# Patient Record
Sex: Female | Born: 1935 | Race: White | Hispanic: No | Marital: Single | Smoking: Never smoker
Health system: Southern US, Community
[De-identification: ages and names within clinical notes are randomized; demographics above are authoritative.]

## PROBLEM LIST (undated history)

## (undated) DIAGNOSIS — I2510 Atherosclerotic heart disease of native coronary artery without angina pectoris: Secondary | ICD-10-CM

## (undated) DIAGNOSIS — K219 Gastro-esophageal reflux disease without esophagitis: Secondary | ICD-10-CM

## (undated) DIAGNOSIS — J449 Chronic obstructive pulmonary disease, unspecified: Secondary | ICD-10-CM

## (undated) DIAGNOSIS — K589 Irritable bowel syndrome without diarrhea: Secondary | ICD-10-CM

## (undated) DIAGNOSIS — F0390 Unspecified dementia without behavioral disturbance: Secondary | ICD-10-CM

## (undated) DIAGNOSIS — R12 Heartburn: Secondary | ICD-10-CM

## (undated) DIAGNOSIS — F411 Generalized anxiety disorder: Secondary | ICD-10-CM

## (undated) DIAGNOSIS — E785 Hyperlipidemia, unspecified: Secondary | ICD-10-CM

## (undated) DIAGNOSIS — E079 Disorder of thyroid, unspecified: Secondary | ICD-10-CM

## (undated) DIAGNOSIS — C801 Malignant (primary) neoplasm, unspecified: Secondary | ICD-10-CM

## (undated) DIAGNOSIS — H9110 Presbycusis, unspecified ear: Secondary | ICD-10-CM

## (undated) DIAGNOSIS — G4733 Obstructive sleep apnea (adult) (pediatric): Secondary | ICD-10-CM

## (undated) DIAGNOSIS — I639 Cerebral infarction, unspecified: Secondary | ICD-10-CM

## (undated) DIAGNOSIS — I219 Acute myocardial infarction, unspecified: Secondary | ICD-10-CM

## (undated) HISTORY — DX: Disorder of thyroid, unspecified: E07.9

## (undated) HISTORY — DX: Heartburn: R12

## (undated) HISTORY — DX: Cerebral infarction, unspecified: I63.9

## (undated) HISTORY — DX: Acute myocardial infarction, unspecified: I21.9

## (undated) HISTORY — DX: Atherosclerotic heart disease of native coronary artery without angina pectoris: I25.10

## (undated) HISTORY — PX: BREAST SURGERY: SHX581

## (undated) HISTORY — PX: ABDOMINAL HYSTERECTOMY: SHX81

## (undated) HISTORY — DX: Chronic obstructive pulmonary disease, unspecified: J44.9

## (undated) HISTORY — DX: Irritable bowel syndrome, unspecified: K58.9

## (undated) HISTORY — DX: Generalized anxiety disorder: F41.1

## (undated) HISTORY — DX: Gastro-esophageal reflux disease without esophagitis: K21.9

## (undated) HISTORY — DX: Presbycusis, unspecified ear: H91.10

## (undated) HISTORY — DX: Hyperlipidemia, unspecified: E78.5

## (undated) HISTORY — DX: Malignant (primary) neoplasm, unspecified: C80.1

## (undated) HISTORY — DX: Obstructive sleep apnea (adult) (pediatric): G47.33

---

## 2004-07-17 ENCOUNTER — Ambulatory Visit: Payer: Self-pay | Admitting: Otolaryngology

## 2004-11-14 ENCOUNTER — Ambulatory Visit: Payer: Self-pay | Admitting: Surgery

## 2004-11-29 ENCOUNTER — Ambulatory Visit: Payer: Self-pay | Admitting: Gastroenterology

## 2004-12-25 ENCOUNTER — Inpatient Hospital Stay: Payer: Self-pay | Admitting: Surgery

## 2004-12-27 ENCOUNTER — Other Ambulatory Visit: Payer: Self-pay

## 2005-01-22 ENCOUNTER — Ambulatory Visit: Payer: Self-pay | Admitting: Gastroenterology

## 2005-02-13 ENCOUNTER — Ambulatory Visit: Payer: Self-pay | Admitting: Surgery

## 2005-05-05 ENCOUNTER — Ambulatory Visit: Payer: Self-pay | Admitting: Gastroenterology

## 2005-05-21 ENCOUNTER — Ambulatory Visit: Payer: Self-pay | Admitting: Surgery

## 2005-06-23 ENCOUNTER — Ambulatory Visit: Payer: Self-pay | Admitting: General Surgery

## 2005-09-23 ENCOUNTER — Ambulatory Visit: Payer: Self-pay | Admitting: Unknown Physician Specialty

## 2005-11-26 ENCOUNTER — Ambulatory Visit: Payer: Self-pay | Admitting: Surgery

## 2005-12-10 ENCOUNTER — Ambulatory Visit: Payer: Self-pay | Admitting: Oncology

## 2006-05-15 ENCOUNTER — Ambulatory Visit: Payer: Self-pay | Admitting: Oncology

## 2006-06-30 ENCOUNTER — Ambulatory Visit: Payer: Self-pay | Admitting: General Surgery

## 2007-06-03 ENCOUNTER — Ambulatory Visit: Payer: Self-pay | Admitting: Unknown Physician Specialty

## 2007-07-01 ENCOUNTER — Ambulatory Visit: Payer: Self-pay | Admitting: Unknown Physician Specialty

## 2007-07-01 ENCOUNTER — Ambulatory Visit: Payer: Self-pay | Admitting: General Surgery

## 2007-07-06 ENCOUNTER — Ambulatory Visit: Payer: Self-pay | Admitting: General Surgery

## 2007-07-30 ENCOUNTER — Ambulatory Visit: Payer: Self-pay | Admitting: Unknown Physician Specialty

## 2007-08-17 ENCOUNTER — Ambulatory Visit: Payer: Self-pay | Admitting: Gastroenterology

## 2008-07-03 ENCOUNTER — Ambulatory Visit: Payer: Self-pay | Admitting: General Surgery

## 2008-07-28 LAB — HM DIABETES EYE EXAM

## 2009-02-21 ENCOUNTER — Ambulatory Visit: Payer: Self-pay | Admitting: Internal Medicine

## 2009-04-03 ENCOUNTER — Ambulatory Visit: Payer: Self-pay | Admitting: Specialist

## 2009-04-23 ENCOUNTER — Inpatient Hospital Stay: Payer: Self-pay | Admitting: Internal Medicine

## 2009-05-31 ENCOUNTER — Encounter: Payer: Self-pay | Admitting: Cardiovascular Disease

## 2009-06-27 ENCOUNTER — Encounter: Payer: Self-pay | Admitting: Cardiovascular Disease

## 2009-07-28 ENCOUNTER — Encounter: Payer: Self-pay | Admitting: Cardiovascular Disease

## 2009-10-16 ENCOUNTER — Ambulatory Visit: Payer: Self-pay | Admitting: General Surgery

## 2009-12-13 DIAGNOSIS — I2510 Atherosclerotic heart disease of native coronary artery without angina pectoris: Secondary | ICD-10-CM | POA: Insufficient documentation

## 2009-12-13 DIAGNOSIS — E785 Hyperlipidemia, unspecified: Secondary | ICD-10-CM | POA: Insufficient documentation

## 2009-12-13 DIAGNOSIS — E119 Type 2 diabetes mellitus without complications: Secondary | ICD-10-CM

## 2010-04-18 ENCOUNTER — Inpatient Hospital Stay: Payer: Self-pay | Admitting: Internal Medicine

## 2010-05-09 ENCOUNTER — Inpatient Hospital Stay: Payer: Self-pay | Admitting: Internal Medicine

## 2010-06-05 ENCOUNTER — Emergency Department: Payer: Self-pay | Admitting: Emergency Medicine

## 2010-07-28 LAB — HM DIABETES FOOT EXAM

## 2010-10-23 ENCOUNTER — Ambulatory Visit: Payer: Self-pay | Admitting: General Surgery

## 2010-11-11 ENCOUNTER — Ambulatory Visit: Payer: Self-pay | Admitting: Internal Medicine

## 2011-04-02 ENCOUNTER — Encounter: Payer: Self-pay | Admitting: Internal Medicine

## 2011-04-03 ENCOUNTER — Ambulatory Visit (INDEPENDENT_AMBULATORY_CARE_PROVIDER_SITE_OTHER): Payer: Medicare Other | Admitting: Internal Medicine

## 2011-04-03 ENCOUNTER — Encounter: Payer: Self-pay | Admitting: Internal Medicine

## 2011-04-03 DIAGNOSIS — E1151 Type 2 diabetes mellitus with diabetic peripheral angiopathy without gangrene: Secondary | ICD-10-CM | POA: Insufficient documentation

## 2011-04-03 DIAGNOSIS — B3731 Acute candidiasis of vulva and vagina: Secondary | ICD-10-CM

## 2011-04-03 DIAGNOSIS — E119 Type 2 diabetes mellitus without complications: Secondary | ICD-10-CM

## 2011-04-03 DIAGNOSIS — B373 Candidiasis of vulva and vagina: Secondary | ICD-10-CM

## 2011-04-03 LAB — COMPREHENSIVE METABOLIC PANEL
Albumin: 3.7 g/dL (ref 3.5–5.2)
Alkaline Phosphatase: 118 U/L — ABNORMAL HIGH (ref 39–117)
BUN: 18 mg/dL (ref 6–23)
CO2: 27 mEq/L (ref 19–32)
Calcium: 8.7 mg/dL (ref 8.4–10.5)
Chloride: 100 mEq/L (ref 96–112)
Glucose, Bld: 415 mg/dL — ABNORMAL HIGH (ref 70–99)
Potassium: 4.1 mEq/L (ref 3.5–5.1)
Sodium: 134 mEq/L — ABNORMAL LOW (ref 135–145)
Total Protein: 7.3 g/dL (ref 6.0–8.3)

## 2011-04-03 LAB — HEMOGLOBIN A1C: Hgb A1c MFr Bld: 8.3 % — ABNORMAL HIGH (ref 4.6–6.5)

## 2011-04-03 MED ORDER — NYSTATIN 100000 UNIT/GM EX POWD: 1.0000 g | Freq: Two times a day (BID) | CUTANEOUS | Status: DC

## 2011-04-03 MED ORDER — FLUCONAZOLE 150 MG PO TABS: 150.0000 mg | ORAL_TABLET | Freq: Every day | ORAL | Status: AC

## 2011-04-03 NOTE — Patient Instructions (Signed)
Continue Insulin as you are doing. Return visit in 3 months

## 2011-04-03 NOTE — Progress Notes (Signed)
Subjective:    Patient ID: [name] [name], female    DOB: 1931/11/29, 77 y.o.   MRN: [id number]  HPI [name] [name] is a 77 year old female with a history of diabetes mellitus who presents for followup. She brings record of her blood sugars which have ranged from lows in the 50s to highs above 400. She continues to take Lantus insulin 28 units every morning. When her sugar is above 300 she takes 4 units of NovoLog. She reports that if she increases her NovoLog beyond 4 units that her sugar plummets. Her recent hemoglobin A1c has been between 8 and 9%. She is symptomatic with her low blood sugars and carry sugar with her. She is cautious about eating a regular basis. She denies any polyuria, polyphasia, change in sensation or open wounds.  [name] [name] is also concerned today about a rash in her genital area. This rash is been present for several weeks it is described as red and itchy. She has not tried any medication for this. She denies any fever, chills, or other complaints.  Outpatient Encounter Prescriptions as of 04/03/2011  Medication Sig Dispense Refill  . insulin aspart (NOVOLOG FLEXPEN) 100 UNIT/ML injection Sliding scale       . insulin glargine (LANTUS SOLOSTAR) 100 UNIT/ML injection Inject 28 Units into the skin daily.        Marland Kitchen levothyroxine (SYNTHROID, LEVOTHROID) 100 MCG tablet Take 100 mcg by mouth daily.           Review of Systems  Constitutional: Negative for fever, chills, appetite change, fatigue and unexpected weight change.  HENT: Negative for ear pain, congestion, sore throat, trouble swallowing, neck pain, voice change and sinus pressure.   Eyes: Negative for visual disturbance.  Respiratory: Negative for cough, shortness of breath, wheezing and stridor.   Cardiovascular: Negative for chest pain, palpitations and leg swelling.  Gastrointestinal: Negative for nausea, vomiting, abdominal pain, diarrhea, constipation, blood in stool, abdominal distention and anal bleeding.    Genitourinary: Negative for dysuria and flank pain.  Musculoskeletal: Negative for myalgias, arthralgias and gait problem.  Skin: Positive for rash. Negative for color change.  Neurological: Negative for dizziness and headaches.  Hematological: Negative for adenopathy. Does not bruise/bleed easily.  Psychiatric/Behavioral: Negative for suicidal ideas, sleep disturbance and dysphoric mood. The patient is not nervous/anxious.     BP 121/77  Pulse 82  Temp(Src) 98.1 F (36.7 C) (Oral)  Resp 16  Ht 5\' 6"  (1.676 m)  Wt 169 lb 12 oz (76.998 kg)  BMI 27.40 kg/m2  SpO2 97%     Objective:   Physical Exam  Constitutional: She is oriented to person, place, and time. She appears well-developed and well-nourished. No distress.  HENT:  Head: Normocephalic and atraumatic.  Right Ear: External ear normal.  Left Ear: External ear normal.  Nose: Nose normal.  Mouth/Throat: Oropharynx is clear and moist. No oropharyngeal exudate.  Eyes: Conjunctivae are normal. Pupils are equal, round, and reactive to light. Right eye exhibits no discharge. Left eye exhibits no discharge. No scleral icterus.  Neck: Normal range of motion. Neck supple. No tracheal deviation present. No thyromegaly present.  Cardiovascular: Normal rate, regular rhythm, normal heart sounds and intact distal pulses.  Exam reveals no gallop and no friction rub.   No murmur heard. Pulmonary/Chest: Effort normal and breath sounds normal. No respiratory distress. She has no wheezes. She has no rales. She exhibits no tenderness.  Abdominal: Soft. Bowel sounds are normal. She exhibits no distension and no mass. There  is no tenderness. There is no rebound and no guarding.  Musculoskeletal: Normal range of motion. She exhibits no edema and no tenderness.  Lymphadenopathy:    She has no cervical adenopathy.  Neurological: She is alert and oriented to person, place, and time. No cranial nerve deficit. She exhibits normal muscle tone.  Coordination normal.  Skin: Skin is warm and dry. Rash noted. Rash is maculopapular. She is not diaphoretic. No erythema. No pallor.          Erythematous rash over vulva and groin with satellite lesions in groin  Psychiatric: She has a normal mood and affect. Her behavior is normal. Judgment and thought content normal.          Assessment & Plan:  1. Diabetes - patient with diabetes. Blood sugars have been quite labile. Given that she lives alone and has issues with hypoglycemia, we have opted not to increase her basal insulin, and allow for higher BG. Recent HgbA1c have been near 8%. She will continue to monitor her blood sugars closely. She will continue Lantus 28 units daily. She will also continue to use NovoLog 4 units as needed for blood sugars greater than 300. She will call if any repeated blood sugars less than 80 or greater than 300. Otherwise she will return to clinic in 3 months.  2. Candiasis - rash consistent with candidiasis in the genital area. Will treat with 2 days of Diflucan and topical Nystop powder. She will call or return to clinic if rash does not resolve.

## 2011-04-06 ENCOUNTER — Emergency Department: Payer: Self-pay | Admitting: Internal Medicine

## 2011-04-11 ENCOUNTER — Telehealth: Payer: Self-pay | Admitting: Internal Medicine

## 2011-04-11 ENCOUNTER — Other Ambulatory Visit: Payer: Self-pay | Admitting: Internal Medicine

## 2011-04-11 DIAGNOSIS — N760 Acute vaginitis: Secondary | ICD-10-CM

## 2011-04-11 MED ORDER — NYSTATIN-TRIAMCINOLONE 100000-0.1 UNIT/GM-% EX OINT: TOPICAL_OINTMENT | Freq: Two times a day (BID) | CUTANEOUS | Status: AC

## 2011-04-11 NOTE — Telephone Encounter (Signed)
Patient notified

## 2011-04-11 NOTE — Telephone Encounter (Signed)
I will call in Triamcinolone-Nystatin cream. If this does not help, then she should be seen again.

## 2011-04-11 NOTE — Telephone Encounter (Signed)
Patient called the place on her bottom is not getting any better it is worse today  She is wanting to be seen today.

## 2011-04-11 NOTE — Telephone Encounter (Signed)
Patient says that the rash on her bottom is not any better and the nystatin is not helping and she is asking if something else can be called in. Uses [facility] on [address] in [place].

## 2011-04-11 NOTE — Telephone Encounter (Signed)
Left message with patient's husband to have her call me back. 

## 2011-06-16 ENCOUNTER — Telehealth: Payer: Self-pay | Admitting: *Deleted

## 2011-06-16 ENCOUNTER — Encounter: Payer: Self-pay | Admitting: Internal Medicine

## 2011-06-16 ENCOUNTER — Ambulatory Visit (INDEPENDENT_AMBULATORY_CARE_PROVIDER_SITE_OTHER): Payer: Medicare Other | Admitting: Internal Medicine

## 2011-06-16 ENCOUNTER — Inpatient Hospital Stay: Payer: Self-pay | Admitting: Surgery

## 2011-06-16 VITALS — BP 108/64 | HR 86 | Temp 98.2°F | Resp 14 | Wt 165.2 lb

## 2011-06-16 DIAGNOSIS — J189 Pneumonia, unspecified organism: Secondary | ICD-10-CM

## 2011-06-16 DIAGNOSIS — E860 Dehydration: Secondary | ICD-10-CM

## 2011-06-16 NOTE — Telephone Encounter (Signed)
Patient making appt for Acute illness: cough, sore throat, congestion; reports that CBGs are reading 300. Spoke w/ patient, she is Not having any symptoms that would call for immediate attention; we will see her at 3:30p

## 2011-06-16 NOTE — Progress Notes (Signed)
  Subjective:    Patient ID: [name] [name], female    DOB: [birth year], 77 y.o.   MRN: [id number]  HPI 77YO female with DM and CAD presents for acute visit complaining of fever, chills, malaise, cough productive of purulent sputum and recurrent episodes of hemoptysis x 4 days. Notes that son she lives with has also been sick. Notes very poor po intake. Blood sugars have been running >300. Pt reports diminished urine output and severe fatigue. Family members note she has been pale in appearance and has had difficulty standing.  Outpatient Encounter Prescriptions as of 06/16/2011  Medication Sig Dispense Refill  . insulin aspart (NOVOLOG FLEXPEN) 100 UNIT/ML injection Sliding scale       . insulin glargine (LANTUS SOLOSTAR) 100 UNIT/ML injection Inject 28 Units into the skin daily.        Marland Kitchen levothyroxine (SYNTHROID, LEVOTHROID) 100 MCG tablet Take 100 mcg by mouth daily.        Marland Kitchen nystatin (NYSTOP) 100000 UNIT/GM POWD Apply 1 g (100,000 Units total) topically 2 (two) times daily.  60 g  0  . nystatin-triamcinolone (MYCOLOG) ointment Apply topically 2 (two) times daily.  30 g  0    Review of Systems  Constitutional: Positive for fever, chills, diaphoresis and fatigue.  HENT: Positive for ear pain, congestion, rhinorrhea and sinus pressure.   Respiratory: Positive for cough, chest tightness, shortness of breath and wheezing.   Cardiovascular: Positive for chest pain.  Gastrointestinal: Positive for nausea.  Musculoskeletal: Positive for myalgias.   BP 108/64  Pulse 86  Temp(Src) 98.2 F (36.8 C) (Oral)  Resp 14  Wt 165 lb 4 oz (74.957 kg)  SpO2 93%     Objective:   Physical Exam  Constitutional: She is oriented to person, place, and time. She appears well-developed and well-nourished. She has a sickly appearance. No distress.  HENT:  Head: Normocephalic and atraumatic.  Right Ear: External ear normal.  Left Ear: External ear normal.  Nose: Nose normal.  Mouth/Throat: Oropharynx is  clear and moist. No oropharyngeal exudate.  Eyes: Conjunctivae are normal. Pupils are equal, round, and reactive to light. Right eye exhibits no discharge. Left eye exhibits no discharge. No scleral icterus.  Neck: Normal range of motion. Neck supple. No tracheal deviation present. No thyromegaly present.  Cardiovascular: Normal rate, regular rhythm, normal heart sounds and intact distal pulses.  Exam reveals no gallop and no friction rub.   No murmur heard. Pulmonary/Chest: Effort normal. No accessory muscle usage. Not tachypneic. No respiratory distress. She has decreased breath sounds. She has no wheezes. She has rhonchi. She has no rales. She exhibits no tenderness.  Musculoskeletal: Normal range of motion. She exhibits no edema and no tenderness.  Lymphadenopathy:    She has no cervical adenopathy.  Neurological: She is alert and oriented to person, place, and time. No cranial nerve deficit. She exhibits normal muscle tone. Coordination normal.  Skin: Skin is warm and dry. No rash noted. She is not diaphoretic. No erythema. No pallor.  Psychiatric: She has a normal mood and affect. Her behavior is normal. Judgment and thought content normal.          Assessment & Plan:  1. Pneumonia - Pt hypoxic with rhonchi bilateral lung fields.  Orthostatic with difficulty standing c/w dehydration. BG markedly elevated. Discussed with family. Will send to ED for urgent evaluation including CXR, labs including CBC, CMP, blood culture, IV antibiotics, IVF.  ED triage nurse notified.

## 2011-06-24 ENCOUNTER — Telehealth: Payer: Self-pay | Admitting: *Deleted

## 2011-06-24 NOTE — Telephone Encounter (Signed)
[facility] called - Pt will be d/c'd soon from [facility], I gave verbal ok to resume [facility] services.

## 2011-06-26 LAB — PATHOLOGY REPORT

## 2011-07-07 ENCOUNTER — Ambulatory Visit: Payer: Medicare Other | Admitting: Internal Medicine

## 2011-07-07 ENCOUNTER — Inpatient Hospital Stay: Payer: Self-pay | Admitting: Surgery

## 2011-07-13 ENCOUNTER — Other Ambulatory Visit: Payer: Self-pay | Admitting: Internal Medicine

## 2011-07-16 ENCOUNTER — Encounter: Payer: Self-pay | Admitting: Internal Medicine

## 2011-07-16 ENCOUNTER — Ambulatory Visit (INDEPENDENT_AMBULATORY_CARE_PROVIDER_SITE_OTHER): Payer: Medicare Other | Admitting: Internal Medicine

## 2011-07-16 DIAGNOSIS — E119 Type 2 diabetes mellitus without complications: Secondary | ICD-10-CM

## 2011-07-16 DIAGNOSIS — I959 Hypotension, unspecified: Secondary | ICD-10-CM | POA: Insufficient documentation

## 2011-07-16 DIAGNOSIS — K559 Vascular disorder of intestine, unspecified: Secondary | ICD-10-CM

## 2011-07-16 LAB — COMPREHENSIVE METABOLIC PANEL
Albumin: 3.3 g/dL — ABNORMAL LOW (ref 3.5–5.2)
Alkaline Phosphatase: 138 U/L — ABNORMAL HIGH (ref 39–117)
BUN: 13 mg/dL (ref 6–23)
CO2: 27 mEq/L (ref 19–32)
Calcium: 9.4 mg/dL (ref 8.4–10.5)
Chloride: 103 mEq/L (ref 96–112)
GFR: 48.59 mL/min — ABNORMAL LOW (ref >60.00)
Glucose, Bld: 227 mg/dL — ABNORMAL HIGH (ref 70–99)
Potassium: 4.3 mEq/L (ref 3.5–5.1)
Sodium: 139 mEq/L (ref 135–145)
Total Protein: 7.4 g/dL (ref 6.0–8.3)

## 2011-07-16 LAB — CBC WITH DIFFERENTIAL/PLATELET
Basophils Relative: 0.9 % (ref 0.0–3.0)
Eosinophils Relative: 7.3 % — ABNORMAL HIGH (ref 0.0–5.0)
Lymphocytes Relative: 22.7 % (ref 12.0–46.0)
MCV: 96 fl (ref 78.0–100.0)
Monocytes Absolute: 0.3 10*3/uL (ref 0.1–1.0)
Monocytes Relative: 5.7 % (ref 3.0–12.0)
Neutrophils Relative %: 63.4 % (ref 43.0–77.0)
Platelets: 327 10*3/uL (ref 150.0–400.0)
RBC: 3.3 Mil/uL — ABNORMAL LOW (ref 3.87–5.11)
WBC: 5.8 10*3/uL (ref 4.5–10.5)

## 2011-07-16 MED ORDER — INSULIN GLARGINE 100 UNIT/ML ~~LOC~~ SOLN: 15.0000 [IU] | Freq: Every day | SUBCUTANEOUS | Status: DC

## 2011-07-16 NOTE — Progress Notes (Addendum)
Subjective:    Patient ID: [name] [name], female    DOB: [birth year], 77 y.o.   MRN: [id number]  HPI 77 year old female with a history of diabetes, CAD, and recent hospital admission for ischemic bowel. She was discharged last week. During her hospitalization she was found to have ischemic colitis and underwent an urgent colectomy. She reports some persistent fatigue since her discharge last week. She has not noted any fever, chills, abdominal pain. She denies any blood in her stool or black stool. She had a regular bowel movement this morning. She denies any problems with her incision sites. She reports full compliance with her medications. She notes that she has had some low blood sugars, as low as 40, using Lantus. She is symptomatic with these low blood sugars with diaphoresis and fatigue. Her daughter reports that she became confused with a blood sugar of 40. She has also had a few elevated sugars as high as 400.    Outpatient Encounter Prescriptions as of 07/16/2011 Medication Sig Dispense  . HYDROcodone-acetaminophen (NORCO) 5-325 MG per tablet Take 1 tablet by mouth every 6 (six) hours as needed.        . insulin aspart (NOVOLOG FLEXPEN) 100 UNIT/ML injection Sliding scale       . insulin glargine (LANTUS SOLOSTAR) 100 UNIT/ML injection Inject 15 Units into the skin daily.  15 mL  3  . levothyroxine (SYNTHROID, LEVOTHROID) 100 MCG tablet Take 100 mcg by mouth daily.        Marland Kitchen nystatin (NYSTOP) 100000 UNIT/GM POWD Apply 1 g (100,000 Units total) topically 2 (two) times daily.  60 g  0  . nystatin-triamcinolone (MYCOLOG) ointment Apply topically 2 (two) times daily.  30 g  0  . pantoprazole (PROTONIX) 40 MG tablet Take 40 mg by mouth 2 (two) times daily.          Review of Systems  Constitutional: Positive for diaphoresis (with low blood sugars) and fatigue. Negative for fever, chills, appetite change and unexpected weight change.  HENT: Negative for ear pain, congestion, sore throat,  trouble swallowing, neck pain, voice change and sinus pressure.   Eyes: Negative for visual disturbance.  Respiratory: Negative for cough, shortness of breath, wheezing and stridor.   Cardiovascular: Negative for chest pain, palpitations and leg swelling.  Gastrointestinal: Negative for nausea, vomiting, abdominal pain, diarrhea, constipation, blood in stool, abdominal distention and anal bleeding.  Genitourinary: Negative for dysuria and flank pain.  Musculoskeletal: Negative for myalgias, arthralgias and gait problem.  Skin: Negative for color change and rash.  Neurological: Negative for dizziness and headaches.  Hematological: Negative for adenopathy. Does not bruise/bleed easily.  Psychiatric/Behavioral: Negative for suicidal ideas, sleep disturbance and dysphoric mood. The patient is not nervous/anxious.    BP 80/58  Pulse 89  Temp(Src) 97.7 F (36.5 C) (Oral)  Wt 159 lb (72.122 kg)  SpO2 95%     Objective:   Physical Exam  Constitutional: She is oriented to person, place, and time. She appears well-developed and well-nourished. No distress.  HENT:  Head: Normocephalic and atraumatic.  Right Ear: External ear normal.  Left Ear: External ear normal.  Nose: Nose normal.  Mouth/Throat: Oropharynx is clear and moist. No oropharyngeal exudate.  Eyes: Conjunctivae are normal. Pupils are equal, round, and reactive to light. Right eye exhibits no discharge. Left eye exhibits no discharge. No scleral icterus.  Neck: Normal range of motion. Neck supple. No tracheal deviation present. No thyromegaly present.  Cardiovascular: Normal rate, regular rhythm, normal heart sounds  and intact distal pulses.  Exam reveals no gallop and no friction rub.   No murmur heard. Pulmonary/Chest: Effort normal and breath sounds normal. No respiratory distress. She has no wheezes. She has no rales. She exhibits no tenderness.  Abdominal: Soft. Bowel sounds are normal. She exhibits no distension and no mass.  There is no tenderness. There is no rebound and no guarding.    Musculoskeletal: Normal range of motion. She exhibits no edema and no tenderness.  Lymphadenopathy:    She has no cervical adenopathy.  Neurological: She is alert and oriented to person, place, and time. No cranial nerve deficit. She exhibits normal muscle tone. Coordination normal.  Skin: Skin is warm and dry. No rash noted. She is not diaphoretic. No erythema. No pallor.  Psychiatric: She has a normal mood and affect. Her behavior is normal. Judgment and thought content normal.          Assessment & Plan:  1. Mesenteric ischemia - s/p colectomy. Doing extremely well. Tolerating a full diet. We'll continue to monitor.  2. Diabetes mellitus -blood sugars have been variable. I am concerned about her low blood sugars. Will reduce her dose of Lantus to 15 units daily. She will continue to use NovoLog 4 units as needed for blood sugars greater than 250. She will followup in one week. She was instructed to record all of her blood sugars in the interim. She will call if there are any blood sugars less than 60 or above 350.  3. Hypoptension - blood pressure is low today. She is not on any antihypertensives. She is asymptomatic. She is able to walk around our office with any dizziness or lightheadedness. She is not febrile. Likely secondary to dehydration after hospital stay. We will send a CBC and a CMP with labs today. If she develops any lightheadedness or malaise she will call. If she develops any fever she will call. I encouraged her to increase her by mouth intake as tolerated. She will followup in one week.

## 2011-07-16 NOTE — Patient Instructions (Signed)
Please write down all blood sugar readings over the next week. Call if any lows <60 or highs >350.  Decrease Lantus to 15units daily.

## 2011-07-17 LAB — HEMOGLOBIN A1C: Hgb A1c MFr Bld: 8.3 % — ABNORMAL HIGH (ref 4.6–6.5)

## 2011-07-21 ENCOUNTER — Encounter: Payer: Self-pay | Admitting: Internal Medicine

## 2011-07-23 ENCOUNTER — Ambulatory Visit: Payer: Medicare Other | Admitting: Internal Medicine

## 2011-07-23 DIAGNOSIS — Z0289 Encounter for other administrative examinations: Secondary | ICD-10-CM

## 2011-07-25 ENCOUNTER — Other Ambulatory Visit: Payer: Self-pay | Admitting: *Deleted

## 2011-07-25 ENCOUNTER — Ambulatory Visit (INDEPENDENT_AMBULATORY_CARE_PROVIDER_SITE_OTHER): Payer: Medicare Other | Admitting: Internal Medicine

## 2011-07-25 ENCOUNTER — Telehealth: Payer: Self-pay | Admitting: *Deleted

## 2011-07-25 ENCOUNTER — Encounter: Payer: Self-pay | Admitting: Internal Medicine

## 2011-07-25 DIAGNOSIS — E119 Type 2 diabetes mellitus without complications: Secondary | ICD-10-CM

## 2011-07-25 DIAGNOSIS — E039 Hypothyroidism, unspecified: Secondary | ICD-10-CM

## 2011-07-25 MED ORDER — LEVOTHYROXINE SODIUM 100 MCG PO TABS: 100.0000 ug | ORAL_TABLET | Freq: Every day | ORAL | Status: DC

## 2011-07-25 MED ORDER — INSULIN ASPART 100 UNIT/ML ~~LOC~~ SOLN: 5.0000 [IU] | Freq: Three times a day (TID) | SUBCUTANEOUS | Status: DC

## 2011-07-25 MED ORDER — INSULIN GLARGINE 100 UNIT/ML ~~LOC~~ SOLN: 20.0000 [IU] | Freq: Every day | SUBCUTANEOUS | Status: DC

## 2011-07-25 NOTE — Patient Instructions (Signed)
Increase lantus to 20units daily.  Use Novolog 5 units before meals ONLY if blood sugar is greater than 300.  Follow up in 2 weeks.

## 2011-07-25 NOTE — Telephone Encounter (Signed)
Spoke w/home health RN - She is concerned b/c pt's cbg's are "all over the place". I advised RN that pt missed apt Wed for f/u on blood sugar. I spoke w/daughter and set up apt for OV today. RN would like a call back after OV to be informed of MD's recommendations.

## 2011-07-25 NOTE — Progress Notes (Signed)
Subjective:    Patient ID: [name] [name], female    DOB: Jul 09, 1932, 77 y.o.   MRN: [id number]  HPI 77 year old female with a history of CAD, diabetes, and recent episode of ischemic colitis status post colectomy presents for followup. We were notified by her home health nurse that her blood sugars have been elevated. She reports that her blood sugars have been intermittently greater than 300. She has had a few values that were so high they did not register. She has also had a few low blood sugars less than 60. During those episodes, she is symptomatic with sweating and nausea. Her daughter reports that she has been taking 15 units of Lantus daily. She has also been using NovoLog 5 units 3 times daily. Her daughter, however, notes that she has been giving the NovoLog just prior to bedtime and not prior to meals. She reports that she has been needing a full diet. She hasn't had any fever or chills. She hasn't had any abdominal pain. She denies any dysuria. She denies any other complaints today.  In regards to her recent ischemic colitis and colectomy, she reports that she has followup with her surgeon today. She denies any abdominal pain. She denies any diarrhea or blood in her stool. She reports having regular bowel movements. She denies any nausea or vomiting. She denies any fever or chills. She notes that her abdominal wound has healed and she is no longer wearing a bandage.  Outpatient Encounter Prescriptions as of 07/25/2011  Medication Sig Dispense Refill  . HYDROcodone-acetaminophen (NORCO) 5-325 MG per tablet Take 1 tablet by mouth every 6 (six) hours as needed.        . insulin aspart (NOVOLOG FLEXPEN) 100 UNIT/ML injection Inject 5 Units into the skin 3 (three) times daily before meals. ONLY if blood sugar greater than 300  1 vial  6  . insulin glargine (LANTUS SOLOSTAR) 100 UNIT/ML injection Inject 20 Units into the skin daily.  15 mL  3  . levothyroxine (SYNTHROID, LEVOTHROID) 100 MCG  tablet Take 1 tablet (100 mcg total) by mouth daily.  90 tablet  1  . nystatin (NYSTOP) 100000 UNIT/GM POWD Apply 1 g (100,000 Units total) topically 2 (two) times daily.  60 g  0  . nystatin-triamcinolone (MYCOLOG) ointment Apply topically 2 (two) times daily.  30 g  0  . pantoprazole (PROTONIX) 40 MG tablet Take 40 mg by mouth 2 (two) times daily.          Review of Systems  Constitutional: Positive for diaphoresis (with hypoglycemia). Negative for fever, chills, appetite change, fatigue and unexpected weight change.  HENT: Negative for ear pain, congestion, sore throat, trouble swallowing, neck pain, voice change and sinus pressure.   Eyes: Negative for visual disturbance.  Respiratory: Negative for cough, shortness of breath, wheezing and stridor.   Cardiovascular: Negative for chest pain, palpitations and leg swelling.  Gastrointestinal: Negative for nausea, vomiting, abdominal pain, diarrhea, constipation, blood in stool, abdominal distention and anal bleeding.  Genitourinary: Negative for dysuria and flank pain.  Musculoskeletal: Negative for myalgias, arthralgias and gait problem.  Skin: Negative for color change and rash.  Neurological: Negative for dizziness and headaches.  Hematological: Negative for adenopathy. Does not bruise/bleed easily.  Psychiatric/Behavioral: Negative for suicidal ideas, sleep disturbance and dysphoric mood. The patient is not nervous/anxious.    BP 112/60  Pulse 86  Temp(Src) 98.1 F (36.7 C) (Oral)  Wt 159 lb (72.122 kg)  SpO2 92%     Objective:  Physical Exam  Constitutional: She is oriented to person, place, and time. She appears well-developed and well-nourished. No distress.  HENT:  Head: Normocephalic and atraumatic.  Right Ear: External ear normal.  Left Ear: External ear normal.  Nose: Nose normal.  Mouth/Throat: Oropharynx is clear and moist. No oropharyngeal exudate.  Eyes: Conjunctivae are normal. Pupils are equal, round, and  reactive to light. Right eye exhibits no discharge. Left eye exhibits no discharge. No scleral icterus.  Neck: Normal range of motion. Neck supple. No tracheal deviation present. No thyromegaly present.  Cardiovascular: Normal rate, regular rhythm, normal heart sounds and intact distal pulses.  Exam reveals no gallop and no friction rub.   No murmur heard. Pulmonary/Chest: Effort normal and breath sounds normal. No respiratory distress. She has no wheezes. She has no rales. She exhibits no tenderness.  Abdominal: Soft.    Musculoskeletal: Normal range of motion. She exhibits no edema and no tenderness.  Lymphadenopathy:    She has no cervical adenopathy.  Neurological: She is alert and oriented to person, place, and time. No cranial nerve deficit. She exhibits normal muscle tone. Coordination normal.  Skin: Skin is warm and dry. No rash noted. She is not diaphoretic. No erythema. No pallor.  Psychiatric: She has a normal mood and affect. Her behavior is normal. Judgment and thought content normal.          Assessment & Plan:  1. Diabetes Mellitus - BG elevated. Will increase Lantus to 20units daily. Gave instructions on use of novolog 5units ONLY before meals if BG >300.  She will record BG 2-3 times per day and follow up in 2 weeks.  2. Ischemic colitis - Pt has follow up with surgeon today. Doing very well. Tolerating full diet. No abdominal pain. Will continue to monitor.

## 2011-07-25 NOTE — Telephone Encounter (Signed)
Informed RN of MD's recommendations from today's OV

## 2011-07-29 HISTORY — PX: COLON SURGERY: SHX602

## 2011-07-31 ENCOUNTER — Telehealth: Payer: Self-pay | Admitting: Internal Medicine

## 2011-07-31 NOTE — Telephone Encounter (Signed)
Patient needs refill lantus.

## 2011-08-01 LAB — HM COLONOSCOPY

## 2011-08-01 MED ORDER — INSULIN GLARGINE 100 UNIT/ML ~~LOC~~ SOLN: 20.0000 [IU] | Freq: Every day | SUBCUTANEOUS | Status: DC

## 2011-08-02 ENCOUNTER — Other Ambulatory Visit: Payer: Self-pay | Admitting: *Deleted

## 2011-08-02 DIAGNOSIS — E039 Hypothyroidism, unspecified: Secondary | ICD-10-CM

## 2011-08-02 MED ORDER — LEVOTHYROXINE SODIUM 100 MCG PO TABS: 100.0000 ug | ORAL_TABLET | Freq: Every day | ORAL | Status: DC

## 2011-08-08 ENCOUNTER — Ambulatory Visit (INDEPENDENT_AMBULATORY_CARE_PROVIDER_SITE_OTHER): Payer: Medicare Other | Admitting: Internal Medicine

## 2011-08-08 ENCOUNTER — Encounter: Payer: Self-pay | Admitting: Internal Medicine

## 2011-08-08 ENCOUNTER — Telehealth: Payer: Self-pay | Admitting: Internal Medicine

## 2011-08-08 DIAGNOSIS — E119 Type 2 diabetes mellitus without complications: Secondary | ICD-10-CM

## 2011-08-08 DIAGNOSIS — K219 Gastro-esophageal reflux disease without esophagitis: Secondary | ICD-10-CM

## 2011-08-08 MED ORDER — PANTOPRAZOLE SODIUM 40 MG PO TBEC: 40.0000 mg | DELAYED_RELEASE_TABLET | Freq: Two times a day (BID) | ORAL | Status: DC

## 2011-08-08 MED ORDER — INSULIN GLARGINE 100 UNIT/ML ~~LOC~~ SOLN: 30.0000 [IU] | Freq: Every day | SUBCUTANEOUS | Status: DC

## 2011-08-08 NOTE — Patient Instructions (Signed)
Increase Lantus to 30units every night.  Check blood sugars 2-3 times per day. Call with blood sugar readings on Monday 1/14.

## 2011-08-08 NOTE — Telephone Encounter (Signed)
Pt has apt today at 11, will discuss at that time and call RN after visit.

## 2011-08-08 NOTE — Telephone Encounter (Signed)
The number that was given for RN is incorrect. Patient was instructed to call our office on Monday with BS readings.

## 2011-08-08 NOTE — Telephone Encounter (Signed)
[phone] [facility]  Saw pt yesterday.  Her blood sugars are running all over the place.  They run from 300  - 500  Diet is not helping  with the insulin.she wanted to know if you wanted to use oral meds in addition to insuln

## 2011-08-10 ENCOUNTER — Encounter: Payer: Self-pay | Admitting: Internal Medicine

## 2011-08-10 NOTE — Progress Notes (Signed)
Subjective:    Patient ID: [name] [name], female    DOB: 1931-12-29, 78 y.o.   MRN: [id number]  HPI 79YO female with DM, CAD, recent hospitalization for mesenteric ischemia presents for follow up. She is concerned about recent elevated BG. She reports BG have been as high as 500. Most sugars between 300-400.  No low BG.  She has recently been increasing her insulin after recovering from surgery and increasing her po intake.  She otherwise reports she has been feeling well.  Outpatient Encounter Prescriptions as of 08/08/2011  Medication Sig Dispense Refill  . HYDROcodone-acetaminophen (NORCO) 5-325 MG per tablet Take 1 tablet by mouth every 6 (six) hours as needed.        . insulin aspart (NOVOLOG FLEXPEN) 100 UNIT/ML injection Inject 5 Units into the skin 3 (three) times daily before meals. ONLY if blood sugar greater than 300  1 vial  6  . insulin glargine (LANTUS SOLOSTAR) 100 UNIT/ML injection Inject 30 Units into the skin daily. Or as directed by MD  15 mL  3  . levothyroxine (SYNTHROID, LEVOTHROID) 100 MCG tablet Take 1 tablet (100 mcg total) by mouth daily.  90 tablet  1  . nystatin (NYSTOP) 100000 UNIT/GM POWD Apply 1 g (100,000 Units total) topically 2 (two) times daily.  60 g  0  . nystatin-triamcinolone (MYCOLOG) ointment Apply topically 2 (two) times daily.  30 g  0  . pantoprazole (PROTONIX) 40 MG tablet Take 1 tablet (40 mg total) by mouth 2 (two) times daily.  180 tablet  1  . DISCONTD: insulin glargine (LANTUS SOLOSTAR) 100 UNIT/ML injection Inject 20 Units into the skin daily. Or as directed by MD  15 mL  3  . DISCONTD: pantoprazole (PROTONIX) 40 MG tablet Take 40 mg by mouth 2 (two) times daily.          Review of Systems  Constitutional: Negative for fever, chills, appetite change, fatigue and unexpected weight change.  HENT: Negative for ear pain, congestion, sore throat, trouble swallowing, neck pain, voice change and sinus pressure.   Eyes: Negative for visual  disturbance.  Respiratory: Negative for cough, shortness of breath, wheezing and stridor.   Cardiovascular: Negative for chest pain, palpitations and leg swelling.  Gastrointestinal: Negative for nausea, vomiting, abdominal pain, diarrhea, constipation, blood in stool, abdominal distention and anal bleeding.  Genitourinary: Negative for dysuria and flank pain.  Musculoskeletal: Negative for myalgias, arthralgias and gait problem.  Skin: Negative for color change and rash.  Neurological: Negative for dizziness and headaches.  Hematological: Negative for adenopathy. Does not bruise/bleed easily.  Psychiatric/Behavioral: Negative for suicidal ideas, sleep disturbance and dysphoric mood. The patient is not nervous/anxious.    BP 92/58  Pulse 86  Temp(Src) 98.2 F (36.8 C) (Oral)  Wt 158 lb (71.668 kg)  SpO2 95%     Objective:   Physical Exam  Constitutional: She is oriented to person, place, and time. She appears well-developed and well-nourished. No distress.  HENT:  Head: Normocephalic and atraumatic.  Right Ear: External ear normal.  Left Ear: External ear normal.  Nose: Nose normal.  Mouth/Throat: Oropharynx is clear and moist. No oropharyngeal exudate.  Eyes: Conjunctivae are normal. Pupils are equal, round, and reactive to light. Right eye exhibits no discharge. Left eye exhibits no discharge. No scleral icterus.  Neck: Normal range of motion. Neck supple. No tracheal deviation present. No thyromegaly present.  Cardiovascular: Normal rate, regular rhythm, normal heart sounds and intact distal pulses.  Exam reveals no  gallop and no friction rub.   No murmur heard. Pulmonary/Chest: Effort normal and breath sounds normal. No respiratory distress. She has no wheezes. She has no rales. She exhibits no tenderness.  Musculoskeletal: Normal range of motion. She exhibits no edema and no tenderness.  Lymphadenopathy:    She has no cervical adenopathy.  Neurological: She is alert and  oriented to person, place, and time. No cranial nerve deficit. She exhibits normal muscle tone. Coordination normal.  Skin: Skin is warm and dry. No rash noted. She is not diaphoretic. No erythema. No pallor.  Psychiatric: She has a normal mood and affect. Her behavior is normal. Judgment and thought content normal.          Assessment & Plan:  1. Diabetes Mellitus - BG recently elevated. Will increase Lantus to 30units daily. Pt daughter will call Monday with update. If BG persistently elevated, will increase Lantus to 40units daily. Will continue Novolog 5units prn with meals if BG>250 prior to meal. Follow up 2 weeks.

## 2011-08-27 ENCOUNTER — Ambulatory Visit (INDEPENDENT_AMBULATORY_CARE_PROVIDER_SITE_OTHER): Payer: Medicare Other | Admitting: Internal Medicine

## 2011-08-27 ENCOUNTER — Encounter: Payer: Self-pay | Admitting: Internal Medicine

## 2011-08-27 DIAGNOSIS — E119 Type 2 diabetes mellitus without complications: Secondary | ICD-10-CM

## 2011-08-27 DIAGNOSIS — D649 Anemia, unspecified: Secondary | ICD-10-CM

## 2011-08-27 LAB — COMPREHENSIVE METABOLIC PANEL
ALT: 22 U/L (ref 0–35)
AST: 22 U/L (ref 0–37)
Albumin: 3.8 g/dL (ref 3.5–5.2)
Alkaline Phosphatase: 99 U/L (ref 39–117)
BUN: 17 mg/dL (ref 6–23)
Calcium: 9.2 mg/dL (ref 8.4–10.5)
Chloride: 103 mEq/L (ref 96–112)
Potassium: 4.1 mEq/L (ref 3.5–5.1)
Sodium: 140 mEq/L (ref 135–145)
Total Protein: 7.5 g/dL (ref 6.0–8.3)

## 2011-08-27 NOTE — Progress Notes (Signed)
Subjective:    Patient ID: [name] [name], female    DOB: 06/03/1932, 78 y.o.   MRN: [id number]  HPI 78 year old female with history of diabetes, coronary artery disease, and recent hospitalization for mesenteric ischemia presents for followup. In regards to her diabetes, she brings record of her blood sugars today which show sugars ranging from 70 to greater than 500. She reports full compliance with her Lantus 20 units at bedtime and has been using NovoLog up to twice daily 5 units before meals. When questioned about her dietary intake, she does note intake of high sugar foods such as canned peaches in heavy syrup. It is after eating food such as these that she has elevated sugars near 500. She is symptomatic with her low sugars with diaphoresis and fatigue. She eats during these episodes with resolution of her symptoms.  In regards to her recent episode of mesenteric ischemia, she notes mild discomfort at the site of her surgical incision which is gradually improving. She reports having normal bowel movements with no blood. She denies any nausea, vomiting, or food intolerance.  Outpatient Encounter Prescriptions as of 08/27/2011  Medication Sig Dispense Refill  . HYDROcodone-acetaminophen (NORCO) 5-325 MG per tablet Take 1 tablet by mouth every 6 (six) hours as needed.        . insulin aspart (NOVOLOG FLEXPEN) 100 UNIT/ML injection Inject 5 Units into the skin 3 (three) times daily before meals. ONLY if blood sugar greater than 300  1 vial  6  . insulin glargine (LANTUS) 100 UNIT/ML injection Inject 28 Units into the skin daily. Or as directed by MD      . levothyroxine (SYNTHROID, LEVOTHROID) 100 MCG tablet Take 1 tablet (100 mcg total) by mouth daily.  90 tablet  1  . nystatin (NYSTOP) 100000 UNIT/GM POWD Apply 1 g (100,000 Units total) topically 2 (two) times daily.  60 g  0  . nystatin-triamcinolone (MYCOLOG) ointment Apply topically 2 (two) times daily.  30 g  0  . pantoprazole (PROTONIX) 40  MG tablet Take 1 tablet (40 mg total) by mouth 2 (two) times daily.  180 tablet  1  . DISCONTD: insulin glargine (LANTUS SOLOSTAR) 100 UNIT/ML injection Inject 30 Units into the skin daily. Or as directed by MD  15 mL  3    Review of Systems  Constitutional: Negative for fever, chills, appetite change, fatigue and unexpected weight change.  HENT: Negative for ear pain, congestion, sore throat, trouble swallowing, neck pain, voice change and sinus pressure.   Eyes: Negative for visual disturbance.  Respiratory: Negative for cough, shortness of breath, wheezing and stridor.   Cardiovascular: Negative for chest pain, palpitations and leg swelling.  Gastrointestinal: Positive for abdominal pain (mild at surgical site). Negative for nausea, vomiting, diarrhea, constipation, blood in stool, abdominal distention and anal bleeding.  Genitourinary: Negative for dysuria and flank pain.  Musculoskeletal: Negative for myalgias, arthralgias and gait problem.  Skin: Negative for color change and rash.  Neurological: Negative for dizziness and headaches.  Hematological: Negative for adenopathy. Does not bruise/bleed easily.  Psychiatric/Behavioral: Negative for suicidal ideas, sleep disturbance and dysphoric mood. The patient is not nervous/anxious.    BP 114/58  Pulse 88  Temp(Src) 98 F (36.7 C) (Oral)  Ht 5\' 6"  (1.676 m)  Wt 159 lb (72.122 kg)  BMI 25.66 kg/m2  SpO2 96%     Objective:   Physical Exam  Constitutional: She is oriented to person, place, and time. She appears well-developed and well-nourished. No distress.  HENT:  Head: Normocephalic and atraumatic.  Right Ear: External ear normal.  Left Ear: External ear normal.  Nose: Nose normal.  Mouth/Throat: Oropharynx is clear and moist. No oropharyngeal exudate.  Eyes: Conjunctivae are normal. Pupils are equal, round, and reactive to light. Right eye exhibits no discharge. Left eye exhibits no discharge. No scleral icterus.  Neck:  Normal range of motion. Neck supple. No tracheal deviation present. No thyromegaly present.  Cardiovascular: Normal rate, regular rhythm, normal heart sounds and intact distal pulses.  Exam reveals no gallop and no friction rub.   No murmur heard. Pulmonary/Chest: Effort normal and breath sounds normal. No respiratory distress. She has no wheezes. She has no rales. She exhibits no tenderness.  Musculoskeletal: Normal range of motion. She exhibits no edema and no tenderness.  Lymphadenopathy:    She has no cervical adenopathy.  Neurological: She is alert and oriented to person, place, and time. No cranial nerve deficit. She exhibits normal muscle tone. Coordination normal.  Skin: Skin is warm and dry. No rash noted. She is not diaphoretic. No erythema. No pallor.  Psychiatric: She has a normal mood and affect. Her behavior is normal. Judgment and thought content normal.          Assessment & Plan:

## 2011-08-27 NOTE — Assessment & Plan Note (Signed)
Sugars continued to be quite variable. It appears that this is secondary to some fluctuation in patient's diet. We'll plan to set her up for discussion with a nutritionist. Made some simple suggestions today such as eating fresh fruits instead of fruits canned in syrup.  Given that she is having some lows, will leave dose of Lantus as it is. She will use NovoLog 5 units 3 times daily with meals. Followup in one month.

## 2011-08-28 LAB — CBC WITH DIFFERENTIAL/PLATELET
Basophils Absolute: 0 10*3/uL (ref 0.0–0.1)
Eosinophils Absolute: 0.3 10*3/uL (ref 0.0–0.7)
Eosinophils Relative: 5 % (ref 0–5)
HCT: 34.2 % — ABNORMAL LOW (ref 36.0–46.0)
Lymphocytes Relative: 36 % (ref 12–46)
MCH: 30.3 pg (ref 26.0–34.0)
MCV: 93.4 fL (ref 78.0–100.0)
Monocytes Absolute: 0.3 10*3/uL (ref 0.1–1.0)
RDW: 14.6 % (ref 11.5–15.5)
WBC: 5.5 10*3/uL (ref 4.0–10.5)

## 2011-09-02 ENCOUNTER — Telehealth: Payer: Self-pay | Admitting: Internal Medicine

## 2011-09-02 NOTE — Telephone Encounter (Signed)
Faxed paper work to [id number] fax # [phone].

## 2011-09-05 ENCOUNTER — Other Ambulatory Visit: Payer: Self-pay | Admitting: *Deleted

## 2011-09-05 MED ORDER — GLUCOSE BLOOD VI STRP: ORAL_STRIP | Status: DC

## 2011-09-29 ENCOUNTER — Encounter: Payer: Self-pay | Admitting: Internal Medicine

## 2011-09-29 ENCOUNTER — Ambulatory Visit (INDEPENDENT_AMBULATORY_CARE_PROVIDER_SITE_OTHER): Payer: Medicare Other | Admitting: Internal Medicine

## 2011-09-29 VITALS — BP 120/68 | HR 95 | Temp 97.9°F | Ht 66.0 in | Wt 163.0 lb

## 2011-09-29 DIAGNOSIS — I798 Other disorders of arteries, arterioles and capillaries in diseases classified elsewhere: Secondary | ICD-10-CM

## 2011-09-29 DIAGNOSIS — E1159 Type 2 diabetes mellitus with other circulatory complications: Secondary | ICD-10-CM

## 2011-09-29 DIAGNOSIS — E1151 Type 2 diabetes mellitus with diabetic peripheral angiopathy without gangrene: Secondary | ICD-10-CM

## 2011-09-29 DIAGNOSIS — E119 Type 2 diabetes mellitus without complications: Secondary | ICD-10-CM

## 2011-09-29 DIAGNOSIS — R490 Dysphonia: Secondary | ICD-10-CM | POA: Insufficient documentation

## 2011-09-29 MED ORDER — INSULIN GLARGINE 100 UNIT/ML ~~LOC~~ SOLN: 28.0000 [IU] | Freq: Every day | SUBCUTANEOUS | Status: DC

## 2011-09-29 NOTE — Assessment & Plan Note (Signed)
BG improved generally. Will continue Lantus 28units daily and Novolog 5units only if BG>250 prior to a meal.  Repeat A1c with labs in 2 months. Follow up 2 months and prn.

## 2011-09-29 NOTE — Assessment & Plan Note (Signed)
Unclear etiology. Exam normal.  No symptoms to suggest allergies or viral illness. Will set up ENT evaluation for direct visualization.

## 2011-09-29 NOTE — Progress Notes (Signed)
Subjective:    Patient ID: [name] [name], female    DOB: [birth year], 78 y.o.   MRN: [id number]  HPI 78YO female with h/o DM presents for follow up.  Brings record of BG. Most 100-250. 2 highs above 300.  2 lows 55,60, which were symptomatic.  Reports compliance with her medications including lantus 28units and novolog 5units tid prior to meals if BG>250.  She reports she is generally feeling well.  She is concerned about 3 week history of hoarseness.  She denies any sore throat, fever, cough, congestion.  She denies dysphagia.  Symptoms began fairly suddenly and have persisted without improvement or change.  Outpatient Encounter Prescriptions as of 09/29/2011  Medication Sig Dispense Refill  . glucose blood (TRUETRACK TEST) test strip Use as directed 4 to 6 times daily Dx: 250.02  150 each  12  . HYDROcodone-acetaminophen (NORCO) 5-325 MG per tablet Take 1 tablet by mouth every 6 (six) hours as needed.        . insulin aspart (NOVOLOG FLEXPEN) 100 UNIT/ML injection Inject 5 Units into the skin 3 (three) times daily before meals. ONLY if blood sugar greater than 300  1 vial  6  . insulin glargine (LANTUS) 100 UNIT/ML injection Inject 28 Units into the skin daily. Or as directed by MD  10 mL  3  . levothyroxine (SYNTHROID, LEVOTHROID) 100 MCG tablet Take 1 tablet (100 mcg total) by mouth daily.  90 tablet  1  . nystatin (NYSTOP) 100000 UNIT/GM POWD Apply 1 g (100,000 Units total) topically 2 (two) times daily.  60 g  0  . nystatin-triamcinolone (MYCOLOG) ointment Apply topically 2 (two) times daily.  30 g  0  . pantoprazole (PROTONIX) 40 MG tablet Take 1 tablet (40 mg total) by mouth 2 (two) times daily.  180 tablet  1  . DISCONTD: insulin glargine (LANTUS) 100 UNIT/ML injection Inject 28 Units into the skin daily. Or as directed by MD         Review of Systems  Constitutional: Negative for fever, chills, appetite change, fatigue and unexpected weight change.  HENT: Negative for ear pain,  congestion, sore throat, trouble swallowing, neck pain, voice change and sinus pressure.   Eyes: Negative for visual disturbance.  Respiratory: Negative for cough, shortness of breath, wheezing and stridor.   Cardiovascular: Negative for chest pain, palpitations and leg swelling.  Gastrointestinal: Negative for nausea, vomiting, abdominal pain, diarrhea, constipation, blood in stool, abdominal distention and anal bleeding.  Genitourinary: Negative for dysuria and flank pain.  Musculoskeletal: Negative for myalgias, arthralgias and gait problem.  Skin: Negative for color change and rash.  Neurological: Negative for dizziness and headaches.  Hematological: Negative for adenopathy. Does not bruise/bleed easily.  Psychiatric/Behavioral: Negative for suicidal ideas, sleep disturbance and dysphoric mood. The patient is not nervous/anxious.    BP 120/68  Pulse 95  Temp(Src) 97.9 F (36.6 C) (Oral)  Ht 5\' 6"  (1.676 m)  Wt 163 lb (73.936 kg)  BMI 26.31 kg/m2  SpO2 97%     Objective:   Physical Exam  Constitutional: She is oriented to person, place, and time. She appears well-developed and well-nourished. No distress.  HENT:  Head: Normocephalic and atraumatic.  Right Ear: External ear normal.  Left Ear: External ear normal.  Nose: Nose normal.  Mouth/Throat: Oropharynx is clear and moist. No oropharyngeal exudate.  Eyes: Conjunctivae are normal. Pupils are equal, round, and reactive to light. Right eye exhibits no discharge. Left eye exhibits no discharge. No scleral icterus.  Neck: Normal range of motion. Neck supple. No tracheal deviation present. No thyromegaly present.  Cardiovascular: Normal rate, regular rhythm, normal heart sounds and intact distal pulses.  Exam reveals no gallop and no friction rub.   No murmur heard. Pulmonary/Chest: Effort normal and breath sounds normal. No respiratory distress. She has no wheezes. She has no rales. She exhibits no tenderness.  Musculoskeletal:  Normal range of motion. She exhibits no edema and no tenderness.  Lymphadenopathy:    She has no cervical adenopathy.  Neurological: She is alert and oriented to person, place, and time. No cranial nerve deficit. She exhibits normal muscle tone. Coordination normal.  Skin: Skin is warm and dry. No rash noted. She is not diaphoretic. No erythema. No pallor.  Psychiatric: She has a normal mood and affect. Her behavior is normal. Judgment and thought content normal.          Assessment & Plan:

## 2011-11-28 ENCOUNTER — Other Ambulatory Visit (INDEPENDENT_AMBULATORY_CARE_PROVIDER_SITE_OTHER): Payer: Medicare Other | Admitting: *Deleted

## 2011-11-28 LAB — COMPREHENSIVE METABOLIC PANEL
ALT: 25 U/L (ref 0–35)
Albumin: 3.8 g/dL (ref 3.5–5.2)
Alkaline Phosphatase: 105 U/L (ref 39–117)
Glucose, Bld: 227 mg/dL — ABNORMAL HIGH (ref 70–99)
Potassium: 4.4 mEq/L (ref 3.5–5.1)
Sodium: 137 mEq/L (ref 135–145)
Total Bilirubin: 0.6 mg/dL (ref 0.3–1.2)
Total Protein: 7.2 g/dL (ref 6.0–8.3)

## 2011-11-28 LAB — HEMOGLOBIN A1C: Hgb A1c MFr Bld: 10.7 % — ABNORMAL HIGH (ref 4.6–6.5)

## 2011-11-28 LAB — LIPID PANEL
HDL: 134.5 mg/dL (ref >39.00)
Total CHOL/HDL Ratio: 2
Triglycerides: 67 mg/dL (ref 0.0–149.0)

## 2011-12-03 ENCOUNTER — Ambulatory Visit (INDEPENDENT_AMBULATORY_CARE_PROVIDER_SITE_OTHER): Payer: Medicare Other | Admitting: Internal Medicine

## 2011-12-03 ENCOUNTER — Encounter: Payer: Self-pay | Admitting: Internal Medicine

## 2011-12-03 VITALS — BP 98/66 | HR 72 | Temp 97.6°F | Wt 167.0 lb

## 2011-12-03 DIAGNOSIS — I798 Other disorders of arteries, arterioles and capillaries in diseases classified elsewhere: Secondary | ICD-10-CM

## 2011-12-03 DIAGNOSIS — E1151 Type 2 diabetes mellitus with diabetic peripheral angiopathy without gangrene: Secondary | ICD-10-CM

## 2011-12-03 DIAGNOSIS — E785 Hyperlipidemia, unspecified: Secondary | ICD-10-CM

## 2011-12-03 DIAGNOSIS — E1159 Type 2 diabetes mellitus with other circulatory complications: Secondary | ICD-10-CM

## 2011-12-03 MED ORDER — ATORVASTATIN CALCIUM 20 MG PO TABS: 20.0000 mg | ORAL_TABLET | Freq: Every day | ORAL | Status: DC

## 2011-12-03 NOTE — Assessment & Plan Note (Signed)
LDL is elevated above goal. Will start Lipitor 20 mg daily. Will repeat liver function test and lipids in one month.

## 2011-12-03 NOTE — Assessment & Plan Note (Signed)
Blood sugars were recently quite elevated, however improved over the last couple of days. Encouraged her to use a low sugar version of ensure to help limit total sugar intake. Will continue Lantus 28 units daily and NovoLog 5 units as needed for blood sugars greater than 250. She will followup in one month.

## 2011-12-03 NOTE — Progress Notes (Signed)
Subjective:    Patient ID: [name] [name], female    DOB: 01/11/1932, 78 y.o.   MRN: [id number]  HPI 78 year old female with history of diabetes, coronary artery disease, hypertension, hyperlipidemia presents for followup. She reports that her blood sugars were initially quite elevated over the last month. She notes that she has been drinking a considerable amount of ensure and questions if this may have led to an increase in her blood sugar. She has been compliant with Lantus 28 units daily and NovoLog as needed 5 units for blood sugars greater than 250. She reports over the last couple of days her fasting blood sugars in the morning have been near 90. She has not had any low blood sugars. Her recent hemoglobin A1c was 10.7%. She reports she is generally feeling well.  In regards to her history of hyperlipidemia, we reviewed her labs today which show LDL is above goal at 145. We discussed that goal LDL is less than 70. She reports that she has taken Lipitor in the past and tolerated it well. She denies any myalgia with this medication.  Outpatient Encounter Prescriptions as of 12/03/2011  Medication Sig Dispense Refill  . glucose blood (TRUETRACK TEST) test strip Use as directed 4 to 6 times daily Dx: 250.02  150 each  12  . HYDROcodone-acetaminophen (NORCO) 5-325 MG per tablet Take 1 tablet by mouth every 6 (six) hours as needed.        . insulin aspart (NOVOLOG FLEXPEN) 100 UNIT/ML injection Inject 5 Units into the skin 3 (three) times daily before meals. ONLY if blood sugar greater than 300  1 vial  6  . insulin glargine (LANTUS) 100 UNIT/ML injection Inject 28 Units into the skin daily. Or as directed by MD  10 mL  3  . levothyroxine (SYNTHROID, LEVOTHROID) 100 MCG tablet Take 1 tablet (100 mcg total) by mouth daily.  90 tablet  1  . nystatin (NYSTOP) 100000 UNIT/GM POWD Apply 1 g (100,000 Units total) topically 2 (two) times daily.  60 g  0  . nystatin-triamcinolone (MYCOLOG) ointment Apply  topically 2 (two) times daily.  30 g  0  . pantoprazole (PROTONIX) 40 MG tablet Take 1 tablet (40 mg total) by mouth 2 (two) times daily.  180 tablet  1  . atorvastatin (LIPITOR) 20 MG tablet Take 1 tablet (20 mg total) by mouth daily.  60 tablet  3    Review of Systems  Constitutional: Negative for fever, chills, appetite change, fatigue and unexpected weight change.  HENT: Negative for ear pain, congestion, sore throat, trouble swallowing, neck pain, voice change and sinus pressure.   Eyes: Negative for visual disturbance.  Respiratory: Negative for cough, shortness of breath, wheezing and stridor.   Cardiovascular: Negative for chest pain, palpitations and leg swelling.  Gastrointestinal: Negative for nausea, vomiting, abdominal pain, diarrhea, constipation, blood in stool, abdominal distention and anal bleeding.  Genitourinary: Negative for dysuria and flank pain.  Musculoskeletal: Negative for myalgias, arthralgias and gait problem.  Skin: Negative for color change and rash.  Neurological: Negative for dizziness and headaches.  Hematological: Negative for adenopathy. Does not bruise/bleed easily.  Psychiatric/Behavioral: Negative for suicidal ideas, sleep disturbance and dysphoric mood. The patient is not nervous/anxious.    BP 98/66  Pulse 72  Temp(Src) 97.6 F (36.4 C) (Oral)  Wt 167 lb (75.751 kg)  SpO2 98%     Objective:   Physical Exam  Constitutional: She is oriented to person, place, and time. She appears well-developed  and well-nourished. No distress.  HENT:  Head: Normocephalic and atraumatic.  Right Ear: External ear normal.  Left Ear: External ear normal.  Nose: Nose normal.  Mouth/Throat: Oropharynx is clear and moist. No oropharyngeal exudate.  Eyes: Conjunctivae are normal. Pupils are equal, round, and reactive to light. Right eye exhibits no discharge. Left eye exhibits no discharge. No scleral icterus.  Neck: Normal range of motion. Neck supple. No tracheal  deviation present. No thyromegaly present.  Cardiovascular: Normal rate, regular rhythm, normal heart sounds and intact distal pulses.  Exam reveals no gallop and no friction rub.   No murmur heard. Pulmonary/Chest: Effort normal and breath sounds normal. No respiratory distress. She has no wheezes. She has no rales. She exhibits no tenderness.  Musculoskeletal: Normal range of motion. She exhibits no edema and no tenderness.  Lymphadenopathy:    She has no cervical adenopathy.  Neurological: She is alert and oriented to person, place, and time. No cranial nerve deficit. She exhibits normal muscle tone. Coordination normal.  Skin: Skin is warm and dry. No rash noted. She is not diaphoretic. No erythema. No pallor.  Psychiatric: She has a normal mood and affect. Her behavior is normal. Judgment and thought content normal.          Assessment & Plan:

## 2011-12-30 ENCOUNTER — Ambulatory Visit (INDEPENDENT_AMBULATORY_CARE_PROVIDER_SITE_OTHER): Payer: Medicare Other | Admitting: Internal Medicine

## 2011-12-30 ENCOUNTER — Encounter: Payer: Self-pay | Admitting: Internal Medicine

## 2011-12-30 VITALS — BP 122/62 | HR 85 | Temp 98.7°F | Ht 66.0 in | Wt 171.0 lb

## 2011-12-30 DIAGNOSIS — I798 Other disorders of arteries, arterioles and capillaries in diseases classified elsewhere: Secondary | ICD-10-CM

## 2011-12-30 DIAGNOSIS — IMO0001 Reserved for inherently not codable concepts without codable children: Secondary | ICD-10-CM

## 2011-12-30 DIAGNOSIS — E1151 Type 2 diabetes mellitus with diabetic peripheral angiopathy without gangrene: Secondary | ICD-10-CM

## 2011-12-30 DIAGNOSIS — K559 Vascular disorder of intestine, unspecified: Secondary | ICD-10-CM

## 2011-12-30 DIAGNOSIS — E1159 Type 2 diabetes mellitus with other circulatory complications: Secondary | ICD-10-CM

## 2011-12-30 DIAGNOSIS — E1165 Type 2 diabetes mellitus with hyperglycemia: Secondary | ICD-10-CM

## 2011-12-30 MED ORDER — INSULIN GLARGINE 100 UNIT/ML ~~LOC~~ SOLN: 30.0000 [IU] | Freq: Every day | SUBCUTANEOUS | Status: DC

## 2011-12-30 NOTE — Assessment & Plan Note (Addendum)
Blood sugars continue to be quite elevated and labile. Will increase Lantus to 30 units daily. We'll continue NovoLog sliding scale 5 units prior to meals if blood sugar is greater than 250. Patient is traveling to [place] so she will call if blood sugars are consistently greater than 250 or less than 80. Followup in 2 months. Note that patient is not on an ACE inhibitor because of hypotension. She refuses Pneumovax.

## 2011-12-30 NOTE — Patient Instructions (Signed)
Increase Lantus to 30units daily. Call if blood sugars running >250 or less than 80.  Labs prior to visit in 2 months. Call eye doctor to set up appointment.

## 2011-12-30 NOTE — Progress Notes (Signed)
Subjective:    Patient ID: [name] [name], female    DOB: 1932-05-03, 78 y.o.   MRN: [id number]  HPI 78 year old female with history of diabetes, hypertension, hypothyroidism, hyperlipidemia, coronary artery disease presents for followup. In regards to her diabetes, she reports that her blood sugars have been quite labile. She reports that some blood sugars have been greater than 300, but she also has lows less than 50. Those typically occur once or twice per week. High blood sugars occur almost daily. She reports full compliance with her Lantus insulin 28 units daily and Humalog sliding scale 3 times daily.  She also notes that a couple of weeks ago she had an episode of right lower quadrant abdominal pain and vomiting. She denies any blood in her stool. She denies any diarrhea. This episode resolved without any intervention over approximately 48 hours. She has not had any residual symptoms.  Outpatient Encounter Prescriptions as of 12/30/2011  Medication Sig Dispense Refill  . atorvastatin (LIPITOR) 20 MG tablet Take 1 tablet (20 mg total) by mouth daily.  60 tablet  3  . glucose blood (TRUETRACK TEST) test strip Use as directed 4 to 6 times daily Dx: 250.02  150 each  12  . HYDROcodone-acetaminophen (NORCO) 5-325 MG per tablet Take 1 tablet by mouth every 6 (six) hours as needed.        . insulin aspart (NOVOLOG FLEXPEN) 100 UNIT/ML injection Inject 5 Units into the skin 3 (three) times daily before meals. ONLY if blood sugar greater than 300  1 vial  6  . insulin glargine (LANTUS) 100 UNIT/ML injection Inject 30 Units into the skin daily. Or as directed by MD  10 mL  3  . levothyroxine (SYNTHROID, LEVOTHROID) 100 MCG tablet Take 1 tablet (100 mcg total) by mouth daily.  90 tablet  1  . nystatin (NYSTOP) 100000 UNIT/GM POWD Apply 1 g (100,000 Units total) topically 2 (two) times daily.  60 g  0  . nystatin-triamcinolone (MYCOLOG) ointment Apply topically 2 (two) times daily.  30 g  0  .  pantoprazole (PROTONIX) 40 MG tablet Take 1 tablet (40 mg total) by mouth 2 (two) times daily.  180 tablet  1  . DISCONTD: insulin glargine (LANTUS) 100 UNIT/ML injection Inject 28 Units into the skin daily. Or as directed by MD  10 mL  3    Review of Systems  Constitutional: Negative for fever, chills, appetite change, fatigue and unexpected weight change.  HENT: Negative for neck pain.   Eyes: Negative for visual disturbance.  Respiratory: Negative for cough, shortness of breath, wheezing and stridor.   Cardiovascular: Negative for chest pain, palpitations and leg swelling.  Gastrointestinal: Positive for abdominal pain. Negative for vomiting, diarrhea, constipation, blood in stool, abdominal distention and anal bleeding.  Genitourinary: Negative for dysuria and flank pain.  Musculoskeletal: Negative for myalgias, arthralgias and gait problem.  Skin: Negative for color change and rash.  Neurological: Negative for dizziness and headaches.  Hematological: Negative for adenopathy. Does not bruise/bleed easily.  Psychiatric/Behavioral: Negative for suicidal ideas, sleep disturbance and dysphoric mood. The patient is not nervous/anxious.    BP 122/62  Pulse 85  Temp(Src) 98.7 F (37.1 C) (Oral)  Ht 5\' 6"  (1.676 m)  Wt 171 lb (77.565 kg)  BMI 27.60 kg/m2  SpO2 93%     Objective:   Physical Exam  Constitutional: She is oriented to person, place, and time. She appears well-developed and well-nourished. No distress.  HENT:  Head: Normocephalic and  atraumatic.  Right Ear: External ear normal.  Left Ear: External ear normal.  Nose: Nose normal.  Mouth/Throat: Oropharynx is clear and moist. No oropharyngeal exudate.  Eyes: Conjunctivae are normal. Pupils are equal, round, and reactive to light. Right eye exhibits no discharge. Left eye exhibits no discharge. No scleral icterus.  Neck: Normal range of motion. Neck supple. No tracheal deviation present. No thyromegaly present.    Cardiovascular: Normal rate, regular rhythm, normal heart sounds and intact distal pulses.  Exam reveals no gallop and no friction rub.   No murmur heard. Pulmonary/Chest: Effort normal and breath sounds normal. No respiratory distress. She has no wheezes. She has no rales. She exhibits no tenderness.  Abdominal: Soft. Bowel sounds are normal. She exhibits no distension and no mass. There is no tenderness. There is no guarding.  Musculoskeletal: Normal range of motion. She exhibits no edema and no tenderness.  Lymphadenopathy:    She has no cervical adenopathy.  Neurological: She is alert and oriented to person, place, and time. No cranial nerve deficit. She exhibits normal muscle tone. Coordination normal.  Skin: Skin is warm and dry. No rash noted. She is not diaphoretic. No erythema. No pallor.  Psychiatric: She has a normal mood and affect. Her behavior is normal. Judgment and thought content normal.          Assessment & Plan:

## 2011-12-30 NOTE — Assessment & Plan Note (Signed)
Patient with recent history of mesenteric ischemia status post hospitalization with resection of part of her colon. Overall symptoms are improved, however did have some abdominal pain earlier this month. Encouraged her to monitor symptoms closely as she travels to [place] for the next few months. She will call if any recurrence of abdominal pain or change in bowel habits, blood in her stool.

## 2012-03-05 ENCOUNTER — Other Ambulatory Visit (INDEPENDENT_AMBULATORY_CARE_PROVIDER_SITE_OTHER): Payer: Medicare Other | Admitting: *Deleted

## 2012-03-05 DIAGNOSIS — E785 Hyperlipidemia, unspecified: Secondary | ICD-10-CM

## 2012-03-05 LAB — COMPREHENSIVE METABOLIC PANEL
ALT: 22 U/L (ref 0–35)
AST: 23 U/L (ref 0–37)
Albumin: 3.8 g/dL (ref 3.5–5.2)
Alkaline Phosphatase: 101 U/L (ref 39–117)
Glucose, Bld: 174 mg/dL — ABNORMAL HIGH (ref 70–99)
Potassium: 4.3 mEq/L (ref 3.5–5.1)
Sodium: 135 mEq/L (ref 135–145)
Total Bilirubin: 0.8 mg/dL (ref 0.3–1.2)
Total Protein: 7.5 g/dL (ref 6.0–8.3)

## 2012-03-05 LAB — LIPID PANEL
Cholesterol: 261 mg/dL — ABNORMAL HIGH (ref 0–200)
Total CHOL/HDL Ratio: 2
VLDL: 15.8 mg/dL (ref 0.0–40.0)

## 2012-03-05 LAB — LDL CHOLESTEROL, DIRECT: Direct LDL: 107.2 mg/dL

## 2012-03-08 ENCOUNTER — Other Ambulatory Visit (INDEPENDENT_AMBULATORY_CARE_PROVIDER_SITE_OTHER): Payer: Medicare Other | Admitting: *Deleted

## 2012-03-08 DIAGNOSIS — R7309 Other abnormal glucose: Secondary | ICD-10-CM

## 2012-03-08 DIAGNOSIS — R739 Hyperglycemia, unspecified: Secondary | ICD-10-CM

## 2012-03-10 ENCOUNTER — Ambulatory Visit (INDEPENDENT_AMBULATORY_CARE_PROVIDER_SITE_OTHER): Payer: Medicare Other | Admitting: Internal Medicine

## 2012-03-10 ENCOUNTER — Encounter: Payer: Self-pay | Admitting: Internal Medicine

## 2012-03-10 VITALS — BP 120/70 | HR 80 | Temp 98.4°F | Ht 66.0 in | Wt 168.5 lb

## 2012-03-10 DIAGNOSIS — K219 Gastro-esophageal reflux disease without esophagitis: Secondary | ICD-10-CM

## 2012-03-10 DIAGNOSIS — E1165 Type 2 diabetes mellitus with hyperglycemia: Secondary | ICD-10-CM

## 2012-03-10 DIAGNOSIS — Z1239 Encounter for other screening for malignant neoplasm of breast: Secondary | ICD-10-CM

## 2012-03-10 DIAGNOSIS — I798 Other disorders of arteries, arterioles and capillaries in diseases classified elsewhere: Secondary | ICD-10-CM

## 2012-03-10 DIAGNOSIS — E1159 Type 2 diabetes mellitus with other circulatory complications: Secondary | ICD-10-CM

## 2012-03-10 MED ORDER — PANTOPRAZOLE SODIUM 40 MG PO TBEC: 40.0000 mg | DELAYED_RELEASE_TABLET | Freq: Two times a day (BID) | ORAL | Status: DC

## 2012-03-10 MED ORDER — INSULIN GLARGINE 100 UNIT/ML ~~LOC~~ SOLN: 40.0000 [IU] | Freq: Every day | SUBCUTANEOUS | Status: DC

## 2012-03-10 NOTE — Progress Notes (Signed)
Subjective:    Patient ID: [name] [name], female    DOB: January 21, 1932, 78 y.o.   MRN: [id number]  HPI 78 year old female with history of coronary artery disease, mesenteric ischemia, and diabetes mellitus presents for followup. She recently got back from a trip to [place]. She reports she has been feeling well. She notes that her blood sugars have been elevated, typically near 300. She has been taking her Lantus 30 units daily and using NovoLog sliding scale. She reports her blood sugar was 310 this morning and she took 8 units of NovoLog sliding scale with a reduction in her blood sugar to 268. She denies any polyuria or polyphasia. She denies any other complaints or concerns.  Outpatient Encounter Prescriptions as of 03/10/2012  Medication Sig Dispense Refill  . atorvastatin (LIPITOR) 20 MG tablet Take 1 tablet (20 mg total) by mouth daily.  60 tablet  3  . glucose blood (TRUETRACK TEST) test strip Use as directed 4 to 6 times daily Dx: 250.02  150 each  12  . HYDROcodone-acetaminophen (NORCO) 5-325 MG per tablet Take 1 tablet by mouth every 6 (six) hours as needed.        . insulin aspart (NOVOLOG FLEXPEN) 100 UNIT/ML injection Inject 5 Units into the skin 3 (three) times daily before meals. ONLY if blood sugar greater than 300  1 vial  6  . insulin glargine (LANTUS) 100 UNIT/ML injection Inject 40 Units into the skin daily. Or as directed by MD  10 mL  3  . levothyroxine (SYNTHROID, LEVOTHROID) 100 MCG tablet Take 1 tablet (100 mcg total) by mouth daily.  90 tablet  1  . nystatin (NYSTOP) 100000 UNIT/GM POWD Apply 1 g (100,000 Units total) topically 2 (two) times daily.  60 g  0  . nystatin-triamcinolone (MYCOLOG) ointment Apply topically 2 (two) times daily.  30 g  0  . pantoprazole (PROTONIX) 40 MG tablet Take 1 tablet (40 mg total) by mouth 2 (two) times daily.  180 tablet  1  . DISCONTD: insulin glargine (LANTUS) 100 UNIT/ML injection Inject 30 Units into the skin daily. Or as directed by MD   10 mL  3  . DISCONTD: pantoprazole (PROTONIX) 40 MG tablet Take 1 tablet (40 mg total) by mouth 2 (two) times daily.  180 tablet  1   BP 120/70  Pulse 80  Temp 98.4 F (36.9 C) (Oral)  Ht 5\' 6"  (1.676 m)  Wt 168 lb 8 oz (76.431 kg)  BMI 27.20 kg/m2  SpO2 94%  Review of Systems  Constitutional: Negative for fever, chills, appetite change, fatigue and unexpected weight change.  HENT: Negative for ear pain, congestion, sore throat, trouble swallowing, neck pain, voice change and sinus pressure.   Eyes: Negative for visual disturbance.  Respiratory: Negative for cough, shortness of breath, wheezing and stridor.   Cardiovascular: Negative for chest pain, palpitations and leg swelling.  Gastrointestinal: Negative for nausea, vomiting, abdominal pain, diarrhea, constipation, blood in stool, abdominal distention and anal bleeding.  Genitourinary: Negative for dysuria and flank pain.  Musculoskeletal: Negative for myalgias, arthralgias and gait problem.  Skin: Negative for color change and rash.  Neurological: Negative for dizziness and headaches.  Hematological: Negative for adenopathy. Does not bruise/bleed easily.  Psychiatric/Behavioral: Negative for suicidal ideas, disturbed wake/sleep cycle and dysphoric mood. The patient is not nervous/anxious.        Objective:   Physical Exam  Constitutional: She is oriented to person, place, and time. She appears well-developed and well-nourished. No distress.  HENT:  Head: Normocephalic and atraumatic.  Right Ear: External ear normal.  Left Ear: External ear normal.  Nose: Nose normal.  Mouth/Throat: Oropharynx is clear and moist. No oropharyngeal exudate.  Eyes: Conjunctivae are normal. Pupils are equal, round, and reactive to light. Right eye exhibits no discharge. Left eye exhibits no discharge. No scleral icterus.  Neck: Normal range of motion. Neck supple. No tracheal deviation present. No thyromegaly present.  Cardiovascular: Normal  rate, regular rhythm, normal heart sounds and intact distal pulses.  Exam reveals no gallop and no friction rub.   No murmur heard. Pulmonary/Chest: Effort normal and breath sounds normal. No respiratory distress. She has no wheezes. She has no rales. She exhibits no tenderness.  Musculoskeletal: Normal range of motion. She exhibits no edema and no tenderness.  Lymphadenopathy:    She has no cervical adenopathy.  Neurological: She is alert and oriented to person, place, and time. No cranial nerve deficit. She exhibits normal muscle tone. Coordination normal.  Skin: Skin is warm and dry. No rash noted. She is not diaphoretic. No erythema. No pallor.  Psychiatric: She has a normal mood and affect. Her behavior is normal. Judgment and thought content normal.          Assessment & Plan:

## 2012-03-10 NOTE — Patient Instructions (Signed)
Please increase Lantus to 40units daily.  Continue Novolog 5 units if blood sugar is greater than 300 prior to a meal, up to 3 times daily.

## 2012-03-10 NOTE — Assessment & Plan Note (Signed)
Blood sugars markedly elevated. Question if patient is taking Lantus correctly. Will have her increase dose to 40 units daily. If no improvement in blood sugars at next visit, will set up home health referral. Continue NovoLog sliding scale. Followup 2 weeks.

## 2012-03-16 ENCOUNTER — Other Ambulatory Visit: Payer: Self-pay | Admitting: *Deleted

## 2012-03-18 MED ORDER — INSULIN GLARGINE 100 UNIT/ML ~~LOC~~ SOLN: 40.0000 [IU] | Freq: Every day | SUBCUTANEOUS | Status: DC

## 2012-04-02 ENCOUNTER — Encounter: Payer: Self-pay | Admitting: Internal Medicine

## 2012-04-02 ENCOUNTER — Ambulatory Visit (INDEPENDENT_AMBULATORY_CARE_PROVIDER_SITE_OTHER): Payer: Medicare Other | Admitting: Internal Medicine

## 2012-04-02 VITALS — BP 110/64 | HR 86 | Temp 97.6°F | Ht 66.0 in | Wt 168.5 lb

## 2012-04-02 DIAGNOSIS — R300 Dysuria: Secondary | ICD-10-CM

## 2012-04-02 DIAGNOSIS — E1159 Type 2 diabetes mellitus with other circulatory complications: Secondary | ICD-10-CM

## 2012-04-02 DIAGNOSIS — I798 Other disorders of arteries, arterioles and capillaries in diseases classified elsewhere: Secondary | ICD-10-CM

## 2012-04-02 DIAGNOSIS — E039 Hypothyroidism, unspecified: Secondary | ICD-10-CM | POA: Insufficient documentation

## 2012-04-02 DIAGNOSIS — E1151 Type 2 diabetes mellitus with diabetic peripheral angiopathy without gangrene: Secondary | ICD-10-CM

## 2012-04-02 LAB — COMPREHENSIVE METABOLIC PANEL
ALT: 19 U/L (ref 0–35)
BUN: 20 mg/dL (ref 6–23)
CO2: 22 mEq/L (ref 19–32)
Calcium: 9 mg/dL (ref 8.4–10.5)
Chloride: 103 mEq/L (ref 96–112)
Creatinine, Ser: 1.1 mg/dL (ref 0.4–1.2)
GFR: 52.14 mL/min — ABNORMAL LOW (ref >60.00)

## 2012-04-02 LAB — POCT URINALYSIS DIPSTICK
Protein, UA: NEGATIVE
Spec Grav, UA: 1.025
Urobilinogen, UA: 0.2

## 2012-04-02 LAB — POCT CBG (FASTING - GLUCOSE)-MANUAL ENTRY: Glucose Fasting, POC: 215 mg/dL — AB (ref 70–99)

## 2012-04-02 MED ORDER — INSULIN GLARGINE 100 UNIT/ML ~~LOC~~ SOLN: 30.0000 [IU] | Freq: Every day | SUBCUTANEOUS | Status: DC

## 2012-04-02 MED ORDER — CIPROFLOXACIN HCL 250 MG PO TABS: 250.0000 mg | ORAL_TABLET | Freq: Two times a day (BID) | ORAL | Status: AC

## 2012-04-02 NOTE — Assessment & Plan Note (Signed)
Will check TSH with labs today. Continue Synthroid. 

## 2012-04-02 NOTE — Assessment & Plan Note (Signed)
Blood sugars have been quite labile. Question if she is taking insulin appropriately. She reports low blood sugars, as well as 20 on almost a daily basis but recent A1c was 12%. Will set up home health to assist with administration of insulin and blood sugar monitoring. Also question if ongoing urinary tract infection may be contributing. Will treat with Cipro. Followup one week.

## 2012-04-02 NOTE — Assessment & Plan Note (Signed)
Symptoms and urinalysis are consistent with urinary tract infection. Will treat with Cipro twice daily times one week. Followup one week.

## 2012-04-02 NOTE — Progress Notes (Signed)
Subjective:    Patient ID: [name] [name], female    DOB: 19-Jan-1932, 78 y.o.   MRN: [id number]  HPI 78 year old female with history of diabetes, hypothyroidism, mesenteric ischemia, CAD presents for followup. She reports that her blood sugars have been quite labile. She increased her dose of Lantus to 40 units daily and note she has had frequent low blood sugars, typically as low as 20 on a daily basis. During these events, she feels lightheaded and fatigued. Her low sugar typically responds to intake of orange juice. However, she also has sugars as high as 400s during the day. She has not been regularly using her NovoLog. Next  She reports feeling generally fatigued and having some dysuria and increased urinary frequency with suprapubic pain. She denies flank pain, fever, chills.  Outpatient Encounter Prescriptions as of 04/02/2012  Medication Sig Dispense Refill  . atorvastatin (LIPITOR) 20 MG tablet Take 1 tablet (20 mg total) by mouth daily.  60 tablet  3  . glucose blood (TRUETRACK TEST) test strip Use as directed 4 to 6 times daily Dx: 250.02  150 each  12  . HYDROcodone-acetaminophen (NORCO) 5-325 MG per tablet Take 1 tablet by mouth every 6 (six) hours as needed.        . insulin aspart (NOVOLOG FLEXPEN) 100 UNIT/ML injection Inject 5 Units into the skin 3 (three) times daily before meals. ONLY if blood sugar greater than 300  1 vial  6  . insulin glargine (LANTUS SOLOSTAR) 100 UNIT/ML injection Inject 30 Units into the skin at bedtime.  3 mL  11  . levothyroxine (SYNTHROID, LEVOTHROID) 100 MCG tablet Take 1 tablet (100 mcg total) by mouth daily.  90 tablet  1  . nystatin (NYSTOP) 100000 UNIT/GM POWD Apply 1 g (100,000 Units total) topically 2 (two) times daily.  60 g  0  . nystatin-triamcinolone (MYCOLOG) ointment Apply topically 2 (two) times daily.  30 g  0  . pantoprazole (PROTONIX) 40 MG tablet Take 1 tablet (40 mg total) by mouth 2 (two) times daily.  180 tablet  1  . DISCONTD:  insulin glargine (LANTUS SOLOSTAR) 100 UNIT/ML injection Inject 40 Units into the skin at bedtime.  3 mL  11  . ciprofloxacin (CIPRO) 250 MG tablet Take 1 tablet (250 mg total) by mouth 2 (two) times daily.  14 tablet  0   BP 110/64  Pulse 86  Temp 97.6 F (36.4 C) (Oral)  Ht 5\' 6"  (1.676 m)  Wt 168 lb 8 oz (76.431 kg)  BMI 27.20 kg/m2  SpO2 94%  Review of Systems  Constitutional: Positive for fatigue. Negative for fever, chills, appetite change and unexpected weight change.  HENT: Negative for ear pain, congestion, sore throat, trouble swallowing, neck pain, voice change and sinus pressure.   Eyes: Negative for visual disturbance.  Respiratory: Negative for cough, shortness of breath, wheezing and stridor.   Cardiovascular: Negative for chest pain, palpitations and leg swelling.  Gastrointestinal: Positive for abdominal pain. Negative for nausea, vomiting, diarrhea, constipation, blood in stool, abdominal distention and anal bleeding.  Genitourinary: Positive for dysuria, urgency and frequency. Negative for hematuria and flank pain.  Musculoskeletal: Negative for myalgias, arthralgias and gait problem.  Skin: Negative for color change and rash.  Neurological: Negative for dizziness and headaches.  Hematological: Negative for adenopathy. Does not bruise/bleed easily.  Psychiatric/Behavioral: Negative for suicidal ideas, disturbed wake/sleep cycle and dysphoric mood. The patient is not nervous/anxious.        Objective:   Physical  Exam  Constitutional: She is oriented to person, place, and time. She appears well-developed and well-nourished. No distress.  HENT:  Head: Normocephalic and atraumatic.  Right Ear: External ear normal.  Left Ear: External ear normal.  Nose: Nose normal.  Mouth/Throat: Oropharynx is clear and moist. No oropharyngeal exudate.  Eyes: Conjunctivae are normal. Pupils are equal, round, and reactive to light. Right eye exhibits no discharge. Left eye exhibits  no discharge. No scleral icterus.  Neck: Normal range of motion. Neck supple. No tracheal deviation present. No thyromegaly present.  Cardiovascular: Normal rate, regular rhythm, normal heart sounds and intact distal pulses.  Exam reveals no gallop and no friction rub.   No murmur heard. Pulmonary/Chest: Effort normal and breath sounds normal. No respiratory distress. She has no wheezes. She has no rales. She exhibits no tenderness.  Abdominal: Soft. Bowel sounds are normal. She exhibits no distension. There is no tenderness.  Musculoskeletal: Normal range of motion. She exhibits no edema and no tenderness.  Lymphadenopathy:    She has no cervical adenopathy.  Neurological: She is alert and oriented to person, place, and time. No cranial nerve deficit. She exhibits normal muscle tone. Coordination normal.  Skin: Skin is warm and dry. No rash noted. She is not diaphoretic. No erythema. No pallor.  Psychiatric: She has a normal mood and affect. Her behavior is normal. Judgment and thought content normal.          Assessment & Plan:

## 2012-04-04 LAB — URINE CULTURE

## 2012-04-06 ENCOUNTER — Other Ambulatory Visit: Payer: Medicare Other

## 2012-04-07 ENCOUNTER — Other Ambulatory Visit: Payer: Medicare Other

## 2012-04-07 DIAGNOSIS — R300 Dysuria: Secondary | ICD-10-CM

## 2012-04-09 ENCOUNTER — Other Ambulatory Visit: Payer: Self-pay | Admitting: *Deleted

## 2012-04-09 LAB — URINE CULTURE: Colony Count: 4000

## 2012-04-09 MED ORDER — FLUCONAZOLE 150 MG PO TABS: 150.0000 mg | ORAL_TABLET | Freq: Once | ORAL | Status: DC

## 2012-04-14 DIAGNOSIS — E119 Type 2 diabetes mellitus without complications: Secondary | ICD-10-CM

## 2012-04-22 ENCOUNTER — Telehealth: Payer: Self-pay | Admitting: Internal Medicine

## 2012-04-22 NOTE — Telephone Encounter (Signed)
Victorino Dike called left message about ms She stated that ms whitts blood sugar has been over 300 several times this week.  She stated she told ms Burgner that she need to call office when her blood sugars get high.  Pt refusing to call.  Pt also refuses to take the novolog that she was prescribed.  Ms Malkiewicz stated that it drops her blood sugar to fast and makes her shaking,feels sick on stomach  and light headed dizzy.  Victorino Dike would like you to call her after ms whitts appointment on Monday to let her know if she needs to continue working with ms Caperton

## 2012-04-22 NOTE — Telephone Encounter (Signed)
Home health agent called to schedule a follow up appointment for the patient.  The patient does not like taking novalog because it drops her  sugars to low and the house is infested with mold.

## 2012-04-26 ENCOUNTER — Ambulatory Visit: Payer: Medicare Other | Admitting: Internal Medicine

## 2012-05-03 ENCOUNTER — Telehealth: Payer: Self-pay | Admitting: Internal Medicine

## 2012-05-03 ENCOUNTER — Encounter: Payer: Self-pay | Admitting: Internal Medicine

## 2012-05-03 ENCOUNTER — Ambulatory Visit (INDEPENDENT_AMBULATORY_CARE_PROVIDER_SITE_OTHER): Payer: Medicare Other | Admitting: Internal Medicine

## 2012-05-03 ENCOUNTER — Ambulatory Visit: Payer: Self-pay | Admitting: Internal Medicine

## 2012-05-03 VITALS — BP 130/70 | HR 97 | Temp 98.6°F | Ht 66.0 in | Wt 168.5 lb

## 2012-05-03 DIAGNOSIS — R05 Cough: Secondary | ICD-10-CM

## 2012-05-03 DIAGNOSIS — E1165 Type 2 diabetes mellitus with hyperglycemia: Secondary | ICD-10-CM

## 2012-05-03 DIAGNOSIS — D510 Vitamin B12 deficiency anemia due to intrinsic factor deficiency: Secondary | ICD-10-CM

## 2012-05-03 DIAGNOSIS — E039 Hypothyroidism, unspecified: Secondary | ICD-10-CM

## 2012-05-03 LAB — COMPREHENSIVE METABOLIC PANEL
ALT: 21 U/L (ref 0–35)
Alkaline Phosphatase: 80 U/L (ref 39–117)
CO2: 25 mEq/L (ref 19–32)
Creatinine, Ser: 0.9 mg/dL (ref 0.4–1.2)
GFR: 63.52 mL/min (ref >60.00)
Glucose, Bld: 242 mg/dL — ABNORMAL HIGH (ref 70–99)
Sodium: 134 mEq/L — ABNORMAL LOW (ref 135–145)
Total Bilirubin: 0.4 mg/dL (ref 0.3–1.2)

## 2012-05-03 LAB — CBC WITH DIFFERENTIAL/PLATELET
Basophils Absolute: 0 10*3/uL (ref 0.0–0.1)
Basophils Relative: 0.7 % (ref 0.0–3.0)
Eosinophils Absolute: 0.2 10*3/uL (ref 0.0–0.7)
Lymphocytes Relative: 32 % (ref 12.0–46.0)
MCHC: 32.7 g/dL (ref 30.0–36.0)
MCV: 92.9 fl (ref 78.0–100.0)
Monocytes Absolute: 0.3 10*3/uL (ref 0.1–1.0)
Neutrophils Relative %: 58.1 % (ref 43.0–77.0)
Platelets: 267 10*3/uL (ref 150.0–400.0)
RBC: 3.57 Mil/uL — ABNORMAL LOW (ref 3.87–5.11)
RDW: 15.9 % — ABNORMAL HIGH (ref 11.5–14.6)

## 2012-05-03 LAB — TSH: TSH: 4.29 u[IU]/mL (ref 0.35–5.50)

## 2012-05-03 LAB — HEMOGLOBIN A1C: Hgb A1c MFr Bld: 12.1 % — ABNORMAL HIGH (ref 4.6–6.5)

## 2012-05-03 NOTE — Assessment & Plan Note (Signed)
Blood sugars continue to be highly variable. Ranging from 80 to greater than 500. No improvement with the addition of home health services to help with monitoring nutrition intake and use of medication. Question of underlying infection may be contributing to variability in blood sugars. Chest x-ray today shows possible pneumonia. Will treat. Will increase Lantus to 35 units daily. Followup in one month.

## 2012-05-03 NOTE — Telephone Encounter (Signed)
CXR showed possible infiltrate in the Right lung. Would recommend starting Levaquin 500mg  po daily x 7 days.  Needs repeat CXR in 2 weeks. I would HIGHLY recommend that she try to get out of her home into a new living situation (home filled with mold).

## 2012-05-03 NOTE — Progress Notes (Signed)
Subjective:    Patient ID: [name] [name], female    DOB: May 28, 1932, 78 y.o.   MRN: [id number]  HPI 78 year old female with history of diabetes, hypertension, coronary artery disease, hyperlipidemia presents for followup. At her last visit, her blood sugars were noted to be quite. Will with frequent low blood sugars less than 50 and frequent high blood sugars greater than 500. Ultimately, dose of her Lantus insulin was decreased to 30 units daily because of frequent low blood sugars. Home Health evaluation was set up. Patient has recorded blood sugars diligently and also recorded her nutritional intake. Blood sugars are slightly improved, however still highly variable ranging from 80 to greater than 500. Patient reports full compliance with both Lantus and NovoLog insulin. Review with patient dietary record indicates that she is eating adequate protein with her meals. Carbohydrate intake seems reasonable.  Patient is also concerned today about progressive fatigue. This is been ongoing for several weeks. She is concerned because she notes there is mold throughout her home. It started to grow on her furniture. She notes some recent increased cough and notes that her son has been living with her has had extreme cough and nasal congestion. She denies any fever or chills. She denies shortness of breath.  Outpatient Encounter Prescriptions as of 05/03/2012  Medication Sig Dispense Refill  . atorvastatin (LIPITOR) 20 MG tablet Take 1 tablet (20 mg total) by mouth daily.  60 tablet  3  . fluconazole (DIFLUCAN) 150 MG tablet Take 1 tablet (150 mg total) by mouth once.  1 tablet  0  . glucose blood (TRUETRACK TEST) test strip Use as directed 4 to 6 times daily Dx: 250.02  150 each  12  . HYDROcodone-acetaminophen (NORCO) 5-325 MG per tablet Take 1 tablet by mouth every 6 (six) hours as needed.        . insulin aspart (NOVOLOG FLEXPEN) 100 UNIT/ML injection Inject 5 Units into the skin 3 (three) times daily  before meals. ONLY if blood sugar greater than 300  1 vial  6  . insulin glargine (LANTUS SOLOSTAR) 100 UNIT/ML injection Inject 30 Units into the skin at bedtime.  3 mL  11  . levothyroxine (SYNTHROID, LEVOTHROID) 100 MCG tablet Take 1 tablet (100 mcg total) by mouth daily.  90 tablet  1  . nystatin (NYSTOP) 100000 UNIT/GM POWD Apply 1 g (100,000 Units total) topically 2 (two) times daily.  60 g  0  . pantoprazole (PROTONIX) 40 MG tablet Take 1 tablet (40 mg total) by mouth 2 (two) times daily.  180 tablet  1    Review of Systems  Constitutional: Positive for fatigue. Negative for fever, chills, appetite change and unexpected weight change.  HENT: Negative for ear pain, congestion, sore throat, trouble swallowing, neck pain, voice change and sinus pressure.   Eyes: Negative for visual disturbance.  Respiratory: Positive for cough. Negative for shortness of breath, wheezing and stridor.   Cardiovascular: Negative for chest pain, palpitations and leg swelling.  Gastrointestinal: Negative for nausea, vomiting, abdominal pain, diarrhea, constipation, blood in stool, abdominal distention and anal bleeding.  Genitourinary: Negative for dysuria and flank pain.  Musculoskeletal: Negative for myalgias, arthralgias and gait problem.  Skin: Negative for color change and rash.  Neurological: Negative for dizziness and headaches.  Hematological: Negative for adenopathy. Does not bruise/bleed easily.  Psychiatric/Behavioral: Negative for suicidal ideas, disturbed wake/sleep cycle and dysphoric mood. The patient is not nervous/anxious.        Objective:   Physical Exam  Constitutional: She is oriented to person, place, and time. She appears well-developed and well-nourished. No distress.  HENT:  Head: Normocephalic and atraumatic.  Right Ear: External ear normal.  Left Ear: External ear normal.  Nose: Nose normal.  Mouth/Throat: Oropharynx is clear and moist. No oropharyngeal exudate.  Eyes:  Conjunctivae normal are normal. Pupils are equal, round, and reactive to light. Right eye exhibits no discharge. Left eye exhibits no discharge. No scleral icterus.  Neck: Normal range of motion. Neck supple. No tracheal deviation present. No thyromegaly present.  Cardiovascular: Normal rate, regular rhythm, normal heart sounds and intact distal pulses.  Exam reveals no gallop and no friction rub.   No murmur heard. Pulmonary/Chest: Effort normal. No accessory muscle usage. Not tachypneic. No respiratory distress. She has no decreased breath sounds. She has no wheezes. She has rhonchi in the right lower field. She has no rales. She exhibits no tenderness.  Musculoskeletal: Normal range of motion. She exhibits no edema and no tenderness.  Lymphadenopathy:    She has no cervical adenopathy.  Neurological: She is alert and oriented to person, place, and time. No cranial nerve deficit. She exhibits normal muscle tone. Coordination normal.  Skin: Skin is warm and dry. No rash noted. She is not diaphoretic. No erythema. No pallor.  Psychiatric: She has a normal mood and affect. Her behavior is normal. Judgment and thought content normal.          Assessment & Plan:

## 2012-05-03 NOTE — Assessment & Plan Note (Signed)
Patient reports some ongoing fatigue. Thyroid function is normal.

## 2012-05-03 NOTE — Assessment & Plan Note (Signed)
Patient with persistent cough. Exam remarkable for right lower lobe crackles. Chest x-ray shows a possible right lower lobe infiltrate. Will treat with Levaquin. Followup x-ray in 2 weeks.

## 2012-05-04 ENCOUNTER — Telehealth: Payer: Self-pay | Admitting: Internal Medicine

## 2012-05-04 MED ORDER — LEVOFLOXACIN 500 MG PO TABS: 500.0000 mg | ORAL_TABLET | Freq: Every day | ORAL | Status: DC

## 2012-05-04 NOTE — Telephone Encounter (Signed)
Victorino Dike with [facility] is calling to get an update on patients visit yesterday and see if she will continue with skilled nursing services.

## 2012-05-04 NOTE — Telephone Encounter (Signed)
Rx sent to [facility], called patient on home number got no answer or machine.  I will call her back tomorrow.

## 2012-05-05 ENCOUNTER — Telehealth: Payer: Self-pay | Admitting: Internal Medicine

## 2012-05-05 NOTE — Telephone Encounter (Signed)
Caller: Jennifer/Patient; Patient Name: [name] [name], [place]; PCP: Ronna Polio (Adults only); Best Callback Phone Number: [phone] [facility]/Jennifer calling and states she was ask to see patient and do education for diabetes. States blood sugar was flucuating and patient had follow up appointment but needs to know when was done. Advised was seen 10-7 for follow up,. Wants to know if MD wants Home Health to continue seeing patient. PLEASE CALL AND ADVISE IF HOME HEALTH NEEDS CONTINUE

## 2012-05-05 NOTE — Telephone Encounter (Signed)
Patient wants to the results of her chest x-ray.

## 2012-05-05 NOTE — Telephone Encounter (Signed)
I would really like for home health to continue seeing pt.  Can they confirm that pt is giving Lantus injections correctly? In the past, when I observed her, she was not giving it correctly and this may be causing variable sugars.

## 2012-05-06 NOTE — Telephone Encounter (Signed)
Pt was calling back and is wanting to know the results of her Chest x-ray.

## 2012-05-06 NOTE — Telephone Encounter (Signed)
Called patient on home number, no answer or machine, will call back tomorrow.

## 2012-05-06 NOTE — Telephone Encounter (Signed)
Called patient on home number, no answer or machine.  I will call again tomorrow.

## 2012-05-07 ENCOUNTER — Telehealth: Payer: Self-pay | Admitting: Internal Medicine

## 2012-05-07 NOTE — Telephone Encounter (Signed)
[name]  With [facility].  Patient- [name] [name], Dob [birth year], PCP- Dan Humphreys.  She is being discharged from the agency. Please advised Dr. Dan Humphreys

## 2012-05-10 ENCOUNTER — Encounter: Payer: Self-pay | Admitting: Internal Medicine

## 2012-05-10 NOTE — Telephone Encounter (Signed)
I spoke with [name] [name] and she wanted to let Dr. Dan Humphreys know that patient has been discharged.  Per care plan has been completed.

## 2012-05-10 NOTE — Telephone Encounter (Signed)
[name] [name] called to let Dr. Dan Humphreys know that patient has been discharged.  She has completed her care plan.

## 2012-05-11 ENCOUNTER — Encounter: Payer: Self-pay | Admitting: Internal Medicine

## 2012-05-11 ENCOUNTER — Ambulatory Visit (INDEPENDENT_AMBULATORY_CARE_PROVIDER_SITE_OTHER): Payer: Medicare Other | Admitting: Internal Medicine

## 2012-05-11 VITALS — BP 110/60 | HR 92 | Temp 97.9°F | Ht 66.0 in | Wt 171.5 lb

## 2012-05-11 DIAGNOSIS — IMO0002 Reserved for concepts with insufficient information to code with codable children: Secondary | ICD-10-CM

## 2012-05-11 DIAGNOSIS — I798 Other disorders of arteries, arterioles and capillaries in diseases classified elsewhere: Secondary | ICD-10-CM

## 2012-05-11 DIAGNOSIS — E1159 Type 2 diabetes mellitus with other circulatory complications: Secondary | ICD-10-CM

## 2012-05-11 DIAGNOSIS — J189 Pneumonia, unspecified organism: Secondary | ICD-10-CM

## 2012-05-11 DIAGNOSIS — E1151 Type 2 diabetes mellitus with diabetic peripheral angiopathy without gangrene: Secondary | ICD-10-CM

## 2012-05-11 DIAGNOSIS — Z23 Encounter for immunization: Secondary | ICD-10-CM

## 2012-05-11 MED ORDER — INSULIN GLARGINE 100 UNIT/ML ~~LOC~~ SOLN: 35.0000 [IU] | Freq: Every day | SUBCUTANEOUS | Status: DC

## 2012-05-11 NOTE — Assessment & Plan Note (Signed)
BG continue to be quite elevated. Will increase Lantus to 35units daily. Pt will monitor BG twice daily. Goal is for all blood sugars to be less than 300, which would be a major improvement for her. I doubt we will ever achieve blood sugars less than 150 given her BG lability and frequent low BG. Follow up 4 weeks and prn.

## 2012-05-11 NOTE — Patient Instructions (Signed)
Please increase Lantus to 35units daily.  Continue to monitor blood sugar twice daily.  Start Levaquin for lung infection.   Follow up 4 weeks.

## 2012-05-11 NOTE — Progress Notes (Signed)
Subjective:    Patient ID: [name] [name], female    DOB: 07-03-1932, 78 y.o.   MRN: [id number]  HPI 78YO female with h/o DM, CAD presents for follow up. After her last visit, pt was sent for CXR because of cough and chills.  CXR 10/7 showed possible RLL pneumonia. Unfortunately, we were unable to reach pt by phone, and she did not start antibiotics. She reports that symptoms of cough and chills have persisted. She denies fever, chest pain, productive dough.  In regards to DM, BG continue to be elevated. Record of BG shows most BG>300. There is one BG 76.  Pt reports compliance with Lantus 30units daily. She has not been using Novolog.  She did not get our message to increase dose of Lantus. Outpatient Encounter Prescriptions as of 05/11/2012  Medication Sig Dispense Refill  . atorvastatin (LIPITOR) 20 MG tablet Take 1 tablet (20 mg total) by mouth daily.  60 tablet  3  . fluconazole (DIFLUCAN) 150 MG tablet Take 1 tablet (150 mg total) by mouth once.  1 tablet  0  . glucose blood (TRUETRACK TEST) test strip Use as directed 4 to 6 times daily Dx: 250.02  150 each  12  . HYDROcodone-acetaminophen (NORCO) 5-325 MG per tablet Take 1 tablet by mouth every 6 (six) hours as needed.        . insulin aspart (NOVOLOG FLEXPEN) 100 UNIT/ML injection Inject 5 Units into the skin 3 (three) times daily before meals. ONLY if blood sugar greater than 300  1 vial  6  . insulin glargine (LANTUS SOLOSTAR) 100 UNIT/ML injection Inject 35 Units into the skin at bedtime.  3 mL  11  . levofloxacin (LEVAQUIN) 500 MG tablet Take 1 tablet (500 mg total) by mouth daily.  7 tablet  0  . levothyroxine (SYNTHROID, LEVOTHROID) 100 MCG tablet Take 1 tablet (100 mcg total) by mouth daily.  90 tablet  1  . nystatin (NYSTOP) 100000 UNIT/GM POWD Apply 1 g (100,000 Units total) topically 2 (two) times daily.  60 g  0  . pantoprazole (PROTONIX) 40 MG tablet Take 1 tablet (40 mg total) by mouth 2 (two) times daily.  180 tablet  1  .  DISCONTD: insulin glargine (LANTUS SOLOSTAR) 100 UNIT/ML injection Inject 30 Units into the skin at bedtime.  3 mL  11   BP 110/60  Pulse 92  Temp 97.9 F (36.6 C) (Oral)  Ht 5\' 6"  (1.676 m)  Wt 171 lb 8 oz (77.792 kg)  BMI 27.68 kg/m2  SpO2 95%   Review of Systems  Constitutional: Positive for chills. Negative for fever, appetite change, fatigue and unexpected weight change.  HENT: Negative for ear pain, congestion, sore throat, trouble swallowing, neck pain, voice change and sinus pressure.   Eyes: Negative for visual disturbance.  Respiratory: Positive for cough. Negative for shortness of breath, wheezing and stridor.   Cardiovascular: Negative for chest pain, palpitations and leg swelling.  Gastrointestinal: Negative for nausea, vomiting, abdominal pain, diarrhea, constipation, blood in stool, abdominal distention and anal bleeding.  Genitourinary: Negative for dysuria and flank pain.  Musculoskeletal: Negative for myalgias, arthralgias and gait problem.  Skin: Negative for color change and rash.  Neurological: Negative for dizziness and headaches.  Hematological: Negative for adenopathy. Does not bruise/bleed easily.  Psychiatric/Behavioral: Negative for suicidal ideas, disturbed wake/sleep cycle and dysphoric mood. The patient is not nervous/anxious.        Objective:   Physical Exam  Constitutional: She is oriented to  person, place, and time. She appears well-developed and well-nourished. No distress.  HENT:  Head: Normocephalic and atraumatic.  Right Ear: External ear normal.  Left Ear: External ear normal.  Nose: Nose normal.  Mouth/Throat: Oropharynx is clear and moist. No oropharyngeal exudate.  Eyes: Conjunctivae normal are normal. Pupils are equal, round, and reactive to light. Right eye exhibits no discharge. Left eye exhibits no discharge. No scleral icterus.  Neck: Normal range of motion. Neck supple. No tracheal deviation present. No thyromegaly present.    Cardiovascular: Normal rate, regular rhythm, normal heart sounds and intact distal pulses.  Exam reveals no gallop and no friction rub.   No murmur heard. Pulmonary/Chest: Effort normal. No accessory muscle usage. Not tachypneic. No respiratory distress. She has no decreased breath sounds. She has no wheezes. She has rhonchi in the right lower field. She has no rales. She exhibits no tenderness.  Musculoskeletal: Normal range of motion. She exhibits no edema and no tenderness.  Lymphadenopathy:    She has no cervical adenopathy.  Neurological: She is alert and oriented to person, place, and time. No cranial nerve deficit. She exhibits normal muscle tone. Coordination normal.  Skin: Skin is warm and dry. No rash noted. She is not diaphoretic. No erythema. No pallor.  Psychiatric: She has a normal mood and affect. Her behavior is normal. Judgment and thought content normal.          Assessment & Plan:   Outpatient Encounter Prescriptions as of 05/11/2012  Medication Sig Dispense Refill  . atorvastatin (LIPITOR) 20 MG tablet Take 1 tablet (20 mg total) by mouth daily.  60 tablet  3  . fluconazole (DIFLUCAN) 150 MG tablet Take 1 tablet (150 mg total) by mouth once.  1 tablet  0  . glucose blood (TRUETRACK TEST) test strip Use as directed 4 to 6 times daily Dx: 250.02  150 each  12  . HYDROcodone-acetaminophen (NORCO) 5-325 MG per tablet Take 1 tablet by mouth every 6 (six) hours as needed.        . insulin aspart (NOVOLOG FLEXPEN) 100 UNIT/ML injection Inject 5 Units into the skin 3 (three) times daily before meals. ONLY if blood sugar greater than 300  1 vial  6  . insulin glargine (LANTUS SOLOSTAR) 100 UNIT/ML injection Inject 35 Units into the skin at bedtime.  3 mL  11  . levofloxacin (LEVAQUIN) 500 MG tablet Take 1 tablet (500 mg total) by mouth daily.  7 tablet  0  . levothyroxine (SYNTHROID, LEVOTHROID) 100 MCG tablet Take 1 tablet (100 mcg total) by mouth daily.  90 tablet  1  .  nystatin (NYSTOP) 100000 UNIT/GM POWD Apply 1 g (100,000 Units total) topically 2 (two) times daily.  60 g  0  . pantoprazole (PROTONIX) 40 MG tablet Take 1 tablet (40 mg total) by mouth 2 (two) times daily.  180 tablet  1  . DISCONTD: insulin glargine (LANTUS SOLOSTAR) 100 UNIT/ML injection Inject 30 Units into the skin at bedtime.  3 mL  11   BP 110/60  Pulse 92  Temp 97.9 F (36.6 C) (Oral)  Ht 5\' 6"  (1.676 m)  Wt 171 lb 8 oz (77.792 kg)  BMI 27.68 kg/m2  SpO2 95%

## 2012-06-15 ENCOUNTER — Ambulatory Visit (INDEPENDENT_AMBULATORY_CARE_PROVIDER_SITE_OTHER): Payer: Medicare Other | Admitting: Internal Medicine

## 2012-06-15 DIAGNOSIS — E1159 Type 2 diabetes mellitus with other circulatory complications: Secondary | ICD-10-CM

## 2012-06-15 DIAGNOSIS — I798 Other disorders of arteries, arterioles and capillaries in diseases classified elsewhere: Secondary | ICD-10-CM

## 2012-06-16 NOTE — Progress Notes (Signed)
  Subjective:    Patient ID: [name] [name], female    DOB: 1932-03-29, 78 y.o.   MRN: [id number]  HPI No show    Review of Systems     Objective:   Physical Exam        Assessment & Plan:

## 2012-06-25 ENCOUNTER — Ambulatory Visit (INDEPENDENT_AMBULATORY_CARE_PROVIDER_SITE_OTHER): Payer: Medicare Other | Admitting: Internal Medicine

## 2012-06-25 ENCOUNTER — Encounter: Payer: Self-pay | Admitting: Internal Medicine

## 2012-06-25 VITALS — BP 114/64 | HR 82 | Temp 97.8°F | Resp 16 | Wt 165.5 lb

## 2012-06-25 DIAGNOSIS — E1151 Type 2 diabetes mellitus with diabetic peripheral angiopathy without gangrene: Secondary | ICD-10-CM

## 2012-06-25 DIAGNOSIS — R5381 Other malaise: Secondary | ICD-10-CM

## 2012-06-25 DIAGNOSIS — E1165 Type 2 diabetes mellitus with hyperglycemia: Secondary | ICD-10-CM

## 2012-06-25 DIAGNOSIS — E1159 Type 2 diabetes mellitus with other circulatory complications: Secondary | ICD-10-CM

## 2012-06-25 DIAGNOSIS — R5383 Other fatigue: Secondary | ICD-10-CM | POA: Insufficient documentation

## 2012-06-25 DIAGNOSIS — I798 Other disorders of arteries, arterioles and capillaries in diseases classified elsewhere: Secondary | ICD-10-CM

## 2012-06-25 DIAGNOSIS — E039 Hypothyroidism, unspecified: Secondary | ICD-10-CM

## 2012-06-25 DIAGNOSIS — D649 Anemia, unspecified: Secondary | ICD-10-CM

## 2012-06-25 LAB — POCT URINALYSIS DIPSTICK
Bilirubin, UA: NEGATIVE
Glucose, UA: 500
Ketones, UA: NEGATIVE
Leukocytes, UA: NEGATIVE
pH, UA: 5.5

## 2012-06-25 LAB — COMPREHENSIVE METABOLIC PANEL
ALT: 17 U/L (ref 0–35)
Albumin: 3.6 g/dL (ref 3.5–5.2)
Alkaline Phosphatase: 86 U/L (ref 39–117)
CO2: 25 mEq/L (ref 19–32)
GFR: 56.3 mL/min — ABNORMAL LOW (ref >60.00)
Potassium: 4.7 mEq/L (ref 3.5–5.1)
Sodium: 131 mEq/L — ABNORMAL LOW (ref 135–145)
Total Bilirubin: 0.7 mg/dL (ref 0.3–1.2)
Total Protein: 7.1 g/dL (ref 6.0–8.3)

## 2012-06-25 LAB — CBC WITH DIFFERENTIAL/PLATELET
Basophils Absolute: 0 10*3/uL (ref 0.0–0.1)
HCT: 34.7 % — ABNORMAL LOW (ref 36.0–46.0)
Lymphs Abs: 1.5 10*3/uL (ref 0.7–4.0)
MCV: 93.5 fl (ref 78.0–100.0)
Monocytes Absolute: 0.3 10*3/uL (ref 0.1–1.0)
Neutrophils Relative %: 64.8 % (ref 43.0–77.0)
Platelets: 266 10*3/uL (ref 150.0–400.0)
RDW: 15.5 % — ABNORMAL HIGH (ref 11.5–14.6)

## 2012-06-25 MED ORDER — INSULIN GLARGINE 100 UNIT/ML ~~LOC~~ SOLN: 30.0000 [IU] | Freq: Every day | SUBCUTANEOUS | Status: DC

## 2012-06-25 NOTE — Assessment & Plan Note (Signed)
Likely secondary to dehydration from hyperglycemia.  Discussed with pt importance of compliance with insulin regimen. Will check electrolytes, TSH, CBC, A1c with labs. Follow up 2-4 weeks.

## 2012-06-25 NOTE — Assessment & Plan Note (Addendum)
BG elevated, but pt has not been taking Lantus regularly. Encouraged better compliance with Lantus. She will take Lantus 30units daily. She will continue to record BG 2x daily. Will check electrolytes and A1c with labs today. Question if she may ultimately need to be moved to assisted living or in with family that can better help with compliance with insulin. Several attempts at intervention from [facility] and with office visits have been unsuccessful at leading to better compliance with meds. Follow up 2-4 weeks.

## 2012-06-25 NOTE — Patient Instructions (Signed)
Please take the Lantus Insulin 30units daily EVERY DAY

## 2012-06-25 NOTE — Progress Notes (Signed)
Subjective:    Patient ID: [name] [name], female    DOB: Jan 20, 1932, 78 y.o.   MRN: [id number]  HPI 78YO female with h/o DM presents for follow up. Pt reports that BG have been variable, between 60s fasting and >400 post-prandial. She has not been taking lantus daily, but only using lantus on days when BG>300. She has not been taking Novolog. She reports polyphagia and polyuria. She denies fever, chills, but notes significant fatigue. She denies chest pain, dyspnea.  Outpatient Encounter Prescriptions as of 06/25/2012  Medication Sig Dispense Refill  . atorvastatin (LIPITOR) 20 MG tablet Take 1 tablet (20 mg total) by mouth daily.  60 tablet  3  . fluconazole (DIFLUCAN) 150 MG tablet Take 1 tablet (150 mg total) by mouth once.  1 tablet  0  . glucose blood (TRUETRACK TEST) test strip Use as directed 4 to 6 times daily Dx: 250.02  150 each  12  . HYDROcodone-acetaminophen (NORCO) 5-325 MG per tablet Take 1 tablet by mouth every 6 (six) hours as needed.        . insulin aspart (NOVOLOG FLEXPEN) 100 UNIT/ML injection Inject 5 Units into the skin 3 (three) times daily before meals. ONLY if blood sugar greater than 300  1 vial  6  . insulin glargine (LANTUS SOLOSTAR) 100 UNIT/ML injection Inject 30 Units into the skin at bedtime.  3 mL  11  . levofloxacin (LEVAQUIN) 500 MG tablet Take 1 tablet (500 mg total) by mouth daily.  7 tablet  0  . levothyroxine (SYNTHROID, LEVOTHROID) 100 MCG tablet Take 1 tablet (100 mcg total) by mouth daily.  90 tablet  1  . nystatin (NYSTOP) 100000 UNIT/GM POWD Apply 1 g (100,000 Units total) topically 2 (two) times daily.  60 g  0  . pantoprazole (PROTONIX) 40 MG tablet Take 1 tablet (40 mg total) by mouth 2 (two) times daily.  180 tablet  1   BP 114/64  Pulse 82  Temp 97.8 F (36.6 C) (Oral)  Resp 16  Wt 165 lb 8 oz (75.07 kg)  Review of Systems  Constitutional: Positive for fatigue. Negative for fever, chills, appetite change and unexpected weight change.    HENT: Negative for ear pain, congestion, sore throat, trouble swallowing, neck pain, voice change and sinus pressure.   Eyes: Negative for visual disturbance.  Respiratory: Negative for cough, shortness of breath, wheezing and stridor.   Cardiovascular: Negative for chest pain, palpitations and leg swelling.  Gastrointestinal: Negative for nausea, vomiting, abdominal pain, diarrhea, constipation, blood in stool, abdominal distention and anal bleeding.  Genitourinary: Negative for dysuria and flank pain.  Musculoskeletal: Negative for myalgias, arthralgias and gait problem.  Skin: Negative for color change and rash.  Neurological: Negative for dizziness and headaches.  Hematological: Negative for adenopathy. Does not bruise/bleed easily.  Psychiatric/Behavioral: Negative for suicidal ideas, sleep disturbance and dysphoric mood. The patient is not nervous/anxious.        Objective:   Physical Exam  Constitutional: She is oriented to person, place, and time. She appears well-developed and well-nourished. No distress.  HENT:  Head: Normocephalic and atraumatic.  Right Ear: External ear normal.  Left Ear: External ear normal.  Nose: Nose normal.  Mouth/Throat: Oropharynx is clear and moist. No oropharyngeal exudate.  Eyes: Conjunctivae normal are normal. Pupils are equal, round, and reactive to light. Right eye exhibits no discharge. Left eye exhibits no discharge. No scleral icterus.  Neck: Normal range of motion. Neck supple. No tracheal deviation present. No  thyromegaly present.  Cardiovascular: Normal rate, regular rhythm, normal heart sounds and intact distal pulses.  Exam reveals no gallop and no friction rub.   No murmur heard. Pulmonary/Chest: Effort normal and breath sounds normal. No respiratory distress. She has no wheezes. She has no rales. She exhibits no tenderness.  Musculoskeletal: Normal range of motion. She exhibits no edema and no tenderness.  Lymphadenopathy:    She has  no cervical adenopathy.  Neurological: She is alert and oriented to person, place, and time. No cranial nerve deficit. She exhibits normal muscle tone. Coordination normal.  Skin: Skin is warm and dry. No rash noted. She is not diaphoretic. No erythema. No pallor.  Psychiatric: She has a normal mood and affect. Her behavior is normal. Judgment and thought content normal.          Assessment & Plan:

## 2012-06-28 LAB — HEMOGLOBIN A1C: Hgb A1c MFr Bld: 12.3 % — ABNORMAL HIGH (ref 4.6–6.5)

## 2012-07-26 ENCOUNTER — Ambulatory Visit (INDEPENDENT_AMBULATORY_CARE_PROVIDER_SITE_OTHER): Payer: Medicare Other | Admitting: Internal Medicine

## 2012-07-26 ENCOUNTER — Encounter: Payer: Self-pay | Admitting: Internal Medicine

## 2012-07-26 VITALS — BP 120/80 | HR 95 | Temp 97.5°F | Resp 16 | Wt 165.0 lb

## 2012-07-26 DIAGNOSIS — E1159 Type 2 diabetes mellitus with other circulatory complications: Secondary | ICD-10-CM

## 2012-07-26 DIAGNOSIS — Z789 Other specified health status: Secondary | ICD-10-CM | POA: Insufficient documentation

## 2012-07-26 DIAGNOSIS — I798 Other disorders of arteries, arterioles and capillaries in diseases classified elsewhere: Secondary | ICD-10-CM

## 2012-07-26 DIAGNOSIS — E1165 Type 2 diabetes mellitus with hyperglycemia: Secondary | ICD-10-CM

## 2012-07-26 DIAGNOSIS — E785 Hyperlipidemia, unspecified: Secondary | ICD-10-CM

## 2012-07-26 DIAGNOSIS — F411 Generalized anxiety disorder: Secondary | ICD-10-CM

## 2012-07-26 DIAGNOSIS — F419 Anxiety disorder, unspecified: Secondary | ICD-10-CM | POA: Insufficient documentation

## 2012-07-26 DIAGNOSIS — Z1331 Encounter for screening for depression: Secondary | ICD-10-CM

## 2012-07-26 DIAGNOSIS — Z888 Allergy status to other drugs, medicaments and biological substances status: Secondary | ICD-10-CM

## 2012-07-26 MED ORDER — ALPRAZOLAM 0.5 MG PO TABS: 0.5000 mg | ORAL_TABLET | Freq: Two times a day (BID) | ORAL | Status: DC | PRN

## 2012-07-26 NOTE — Progress Notes (Signed)
Subjective:    Patient ID: [name] [name], female    DOB: 1931-08-25, 78 y.o.   MRN: [id number]  HPI 78YO female with h/o DM, CAD, mesenteric ischemia, HTN presents for follow up. PT reports BG have been better controlled. She has been compliant with insulin usage, taking 30 units of Lantus daily.  Most BG near 150.  She had some vomiting and diarrhea last weekend, and had one blood sugar as low as 62, but sugars have improved since then.  She reports some ongoing fatigue, which is unchanged for her.  She denies chest pain, dyspnea, abdominal pain.  Outpatient Encounter Prescriptions as of 07/26/2012  Medication Sig Dispense Refill  . fluconazole (DIFLUCAN) 150 MG tablet Take 1 tablet (150 mg total) by mouth once.  1 tablet  0  . glucose blood (TRUETRACK TEST) test strip Use as directed 4 to 6 times daily Dx: 250.02  150 each  12  . HYDROcodone-acetaminophen (NORCO) 5-325 MG per tablet Take 1 tablet by mouth every 6 (six) hours as needed.        . insulin aspart (NOVOLOG FLEXPEN) 100 UNIT/ML injection Inject 5 Units into the skin 3 (three) times daily before meals. ONLY if blood sugar greater than 300  1 vial  6  . insulin glargine (LANTUS SOLOSTAR) 100 UNIT/ML injection Inject 30 Units into the skin at bedtime.  3 mL  11  . levothyroxine (SYNTHROID, LEVOTHROID) 100 MCG tablet Take 1 tablet (100 mcg total) by mouth daily.  90 tablet  1  . nystatin (NYSTOP) 100000 UNIT/GM POWD Apply 1 g (100,000 Units total) topically 2 (two) times daily.  60 g  0  . pantoprazole (PROTONIX) 40 MG tablet Take 1 tablet (40 mg total) by mouth 2 (two) times daily.  180 tablet  1  . ALPRAZolam (XANAX) 0.5 MG tablet Take 1 tablet (0.5 mg total) by mouth 2 (two) times daily as needed for anxiety.  60 tablet  3   BP 120/80  Pulse 95  Temp 97.5 F (36.4 C) (Oral)  Resp 16  Wt 165 lb (74.844 kg)  SpO2 95%   Review of Systems  Constitutional: Positive for fatigue. Negative for fever, chills, appetite change and  unexpected weight change.  HENT: Negative for ear pain, congestion, sore throat, trouble swallowing, neck pain, voice change and sinus pressure.   Eyes: Negative for visual disturbance.  Respiratory: Negative for cough, shortness of breath, wheezing and stridor.   Cardiovascular: Negative for chest pain, palpitations and leg swelling.  Gastrointestinal: Negative for nausea, vomiting, abdominal pain, diarrhea, constipation, blood in stool, abdominal distention and anal bleeding.  Genitourinary: Negative for dysuria and flank pain.  Musculoskeletal: Negative for myalgias, arthralgias and gait problem.  Skin: Negative for color change and rash.  Neurological: Negative for dizziness and headaches.  Hematological: Negative for adenopathy. Does not bruise/bleed easily.  Psychiatric/Behavioral: Negative for suicidal ideas, sleep disturbance and dysphoric mood. The patient is not nervous/anxious.        Objective:   Physical Exam  Constitutional: She is oriented to person, place, and time. She appears well-developed and well-nourished. No distress.  HENT:  Head: Normocephalic and atraumatic.  Right Ear: External ear normal.  Left Ear: External ear normal.  Nose: Nose normal.  Mouth/Throat: Oropharynx is clear and moist. No oropharyngeal exudate.  Eyes: Conjunctivae normal are normal. Pupils are equal, round, and reactive to light. Right eye exhibits no discharge. Left eye exhibits no discharge. No scleral icterus.  Neck: Normal range of motion.  Neck supple. No tracheal deviation present. No thyromegaly present.  Cardiovascular: Normal rate, regular rhythm, normal heart sounds and intact distal pulses.  Exam reveals no gallop and no friction rub.   No murmur heard. Pulmonary/Chest: Effort normal and breath sounds normal. No respiratory distress. She has no wheezes. She has no rales. She exhibits no tenderness.  Musculoskeletal: Normal range of motion. She exhibits no edema and no tenderness.    Lymphadenopathy:    She has no cervical adenopathy.  Neurological: She is alert and oriented to person, place, and time. No cranial nerve deficit. She exhibits normal muscle tone. Coordination normal.  Skin: Skin is warm and dry. No rash noted. She is not diaphoretic. No erythema. No pallor.  Psychiatric: She has a normal mood and affect. Her behavior is normal. Judgment and thought content normal.          Assessment & Plan:

## 2012-07-26 NOTE — Assessment & Plan Note (Signed)
Pt reports she has stopped her statin medication, lipitor, because of muscle pain with this medication. She also reports pain with other statin drugs. To date, she has not been able to tolerate a statin.

## 2012-07-26 NOTE — Assessment & Plan Note (Signed)
Pt reports intolerance to lipitor with myalgia. Will consider trial of Crestor 5mg  daily at next visit, assuming that myalgias have resolved.

## 2012-07-26 NOTE — Assessment & Plan Note (Signed)
Anxiety recently increased after pt ill with viral illness. Will refill alprazolam for use prn.

## 2012-07-26 NOTE — Assessment & Plan Note (Signed)
Blood sugars appear to be better controlled based on pt record. Will continue current medications. Will plan to recheck A1c 08/2012.

## 2012-09-16 ENCOUNTER — Other Ambulatory Visit (INDEPENDENT_AMBULATORY_CARE_PROVIDER_SITE_OTHER): Payer: Medicare Other

## 2012-09-16 LAB — LIPID PANEL
HDL: 104.1 mg/dL (ref >39.00)
Total CHOL/HDL Ratio: 3
VLDL: 15 mg/dL (ref 0.0–40.0)

## 2012-09-16 LAB — COMPREHENSIVE METABOLIC PANEL
ALT: 21 U/L (ref 0–35)
AST: 21 U/L (ref 0–37)
Alkaline Phosphatase: 86 U/L (ref 39–117)
Sodium: 135 mEq/L (ref 135–145)
Total Bilirubin: 0.7 mg/dL (ref 0.3–1.2)
Total Protein: 7.3 g/dL (ref 6.0–8.3)

## 2012-09-16 LAB — HEMOGLOBIN A1C: Hgb A1c MFr Bld: 13.3 % — ABNORMAL HIGH (ref 4.6–6.5)

## 2012-09-16 LAB — LDL CHOLESTEROL, DIRECT: Direct LDL: 142.8 mg/dL

## 2012-09-17 ENCOUNTER — Telehealth: Payer: Self-pay | Admitting: *Deleted

## 2012-09-17 NOTE — Telephone Encounter (Signed)
Message copied by Theola Sequin on Fri Sep 17, 2012 11:34 AM ------      Message from: Ronna Polio A      Created: Thu Sep 16, 2012  4:05 PM       Labs show that blood sugar is markedly elevated. It is very important for pt to bring record of blood sugars to next visit.  Can we also set up a [facility] referral for her? I think she needs more assistance with taking insulin properly. ------

## 2012-09-17 NOTE — Telephone Encounter (Signed)
Spoke with [name] [name] at [facility], April should be calling the patient directly within the next 7-10 days. Patient is aware of this call.

## 2012-09-23 ENCOUNTER — Ambulatory Visit (INDEPENDENT_AMBULATORY_CARE_PROVIDER_SITE_OTHER): Payer: Medicare Other | Admitting: Internal Medicine

## 2012-09-23 ENCOUNTER — Encounter: Payer: Self-pay | Admitting: Internal Medicine

## 2012-09-23 VITALS — BP 112/80 | HR 88 | Temp 97.7°F | Wt 162.0 lb

## 2012-09-23 DIAGNOSIS — E039 Hypothyroidism, unspecified: Secondary | ICD-10-CM | POA: Insufficient documentation

## 2012-09-23 DIAGNOSIS — K219 Gastro-esophageal reflux disease without esophagitis: Secondary | ICD-10-CM | POA: Insufficient documentation

## 2012-09-23 DIAGNOSIS — I798 Other disorders of arteries, arterioles and capillaries in diseases classified elsewhere: Secondary | ICD-10-CM

## 2012-09-23 DIAGNOSIS — E1159 Type 2 diabetes mellitus with other circulatory complications: Secondary | ICD-10-CM

## 2012-09-23 MED ORDER — INSULIN GLARGINE 100 UNIT/ML ~~LOC~~ SOLN: 35.0000 [IU] | Freq: Every day | SUBCUTANEOUS | Status: DC

## 2012-09-23 NOTE — Assessment & Plan Note (Signed)
Symptoms recently worsened. Will check for H. Pylori. Continue Pantoprazole. If symptoms not improving, plan to set up repeat GI evaluation with EGD.

## 2012-09-23 NOTE — Progress Notes (Signed)
Subjective:    Patient ID: [name] [name], female    DOB: [birth year], 79 y.o.   MRN: [id number]  HPI 79YO female with DM presents for follow up.  DM - BG very high 150s-450s at home. Recent A1c 13%. Pt reports compliance with insulin. No low BG. Tries to limit intake of processed carbs. No longer using Novolog, was confused about dosing. Taking Lantus 30units daily.  GERD - symptoms recently worsening with mid-epigastic pain. No N/V or change in bowel habits. Taking pantoprazole with minimal improvement.  Outpatient Encounter Prescriptions as of 09/23/2012  Medication Sig Dispense Refill  . ALPRAZolam (XANAX) 0.5 MG tablet Take 1 tablet (0.5 mg total) by mouth 2 (two) times daily as needed for anxiety.  60 tablet  3  . HYDROcodone-acetaminophen (NORCO) 5-325 MG per tablet Take 1 tablet by mouth every 6 (six) hours as needed.        . insulin aspart (NOVOLOG FLEXPEN) 100 UNIT/ML injection Inject 5 Units into the skin 3 (three) times daily before meals. ONLY if blood sugar greater than 300  1 vial  6  . insulin glargine (LANTUS SOLOSTAR) 100 UNIT/ML injection Inject 35 Units into the skin at bedtime.  3 mL  11  . levothyroxine (SYNTHROID, LEVOTHROID) 100 MCG tablet Take 1 tablet (100 mcg total) by mouth daily.  90 tablet  1  . pantoprazole (PROTONIX) 40 MG tablet Take 1 tablet (40 mg total) by mouth 2 (two) times daily.  180 tablet  1  . [DISCONTINUED] insulin glargine (LANTUS SOLOSTAR) 100 UNIT/ML injection Inject 30 Units into the skin at bedtime.  3 mL  11  . fluconazole (DIFLUCAN) 150 MG tablet Take 1 tablet (150 mg total) by mouth once.  1 tablet  0  . nystatin (NYSTOP) 100000 UNIT/GM POWD Apply 1 g (100,000 Units total) topically 2 (two) times daily.  60 g  0   No facility-administered encounter medications on file as of 09/23/2012.   BP 112/80  Pulse 88  Temp(Src) 97.7 F (36.5 C) (Oral)  Wt 162 lb (73.483 kg)  BMI 26.16 kg/m2  SpO2 97%  Review of Systems  Constitutional:  Negative for fever, chills, appetite change, fatigue and unexpected weight change.  HENT: Negative for ear pain, congestion, sore throat, trouble swallowing, neck pain, voice change and sinus pressure.   Eyes: Negative for visual disturbance.  Respiratory: Negative for cough, shortness of breath, wheezing and stridor.   Cardiovascular: Negative for chest pain, palpitations and leg swelling.  Gastrointestinal: Positive for abdominal pain. Negative for nausea, vomiting, diarrhea, constipation, blood in stool, abdominal distention and anal bleeding.  Genitourinary: Negative for dysuria and flank pain.  Musculoskeletal: Negative for myalgias, arthralgias and gait problem.  Skin: Negative for color change and rash.  Neurological: Negative for dizziness and headaches.  Hematological: Negative for adenopathy. Does not bruise/bleed easily.  Psychiatric/Behavioral: Negative for suicidal ideas, sleep disturbance and dysphoric mood. The patient is not nervous/anxious.        Objective:   Physical Exam  Constitutional: She is oriented to person, place, and time. She appears well-developed and well-nourished. No distress.  HENT:  Head: Normocephalic and atraumatic.  Right Ear: External ear normal.  Left Ear: External ear normal.  Nose: Nose normal.  Mouth/Throat: Oropharynx is clear and moist. No oropharyngeal exudate.  Eyes: Conjunctivae are normal. Pupils are equal, round, and reactive to light. Right eye exhibits no discharge. Left eye exhibits no discharge. No scleral icterus.  Neck: Normal range of motion. Neck supple. No  tracheal deviation present. No thyromegaly present.  Cardiovascular: Normal rate, regular rhythm, normal heart sounds and intact distal pulses.  Exam reveals no gallop and no friction rub.   No murmur heard. Pulmonary/Chest: Effort normal and breath sounds normal. No respiratory distress. She has no wheezes. She has no rales. She exhibits no tenderness.  Abdominal: Soft. Bowel  sounds are normal. She exhibits no distension and no mass. There is tenderness (epigastric). There is no rebound and no guarding.  Musculoskeletal: Normal range of motion. She exhibits no edema and no tenderness.  Lymphadenopathy:    She has no cervical adenopathy.  Neurological: She is alert and oriented to person, place, and time. No cranial nerve deficit. She exhibits normal muscle tone. Coordination normal.  Skin: Skin is warm and dry. No rash noted. She is not diaphoretic. No erythema. No pallor.  Psychiatric: She has a normal mood and affect. Her behavior is normal. Judgment and thought content normal.          Assessment & Plan:

## 2012-09-23 NOTE — Patient Instructions (Signed)
Increase Lantus to 35units daily.  Check blood sugar before each meal and give 5 units of Novolog if blood sugar is greater than 300.  Follow up 2 weeks.

## 2012-09-23 NOTE — Assessment & Plan Note (Signed)
TSH and T4 normal today, however pt has been taking Synthroid daily from old Rx rather than dose. Will continue Synthroid .

## 2012-09-23 NOTE — Assessment & Plan Note (Signed)
Lab Results  Component Value Date   HGBA1C 13.3* 09/16/2012   BG markedly elevated. Question compliance with medications. Home health evaluation in the past and assistance with medications was not helpful. Will increase Lantus to 35units daily and encouraged pt to resume Novolog 5units prior to meals if BG>300. If no improvement over next 2 weeks, plan for repeat endocrine evaluation.

## 2012-09-27 LAB — HELICOBACTER PYLORI  ANTIBODY, IGM: Helicobacter pylori, IgM: 1 U/mL (ref <9.0)

## 2012-10-14 ENCOUNTER — Encounter: Payer: Self-pay | Admitting: Internal Medicine

## 2012-10-14 ENCOUNTER — Ambulatory Visit (INDEPENDENT_AMBULATORY_CARE_PROVIDER_SITE_OTHER): Payer: Medicare Other | Admitting: Internal Medicine

## 2012-10-14 VITALS — BP 110/68 | HR 90 | Temp 98.1°F | Wt 159.0 lb

## 2012-10-14 DIAGNOSIS — E1151 Type 2 diabetes mellitus with diabetic peripheral angiopathy without gangrene: Secondary | ICD-10-CM

## 2012-10-14 DIAGNOSIS — E039 Hypothyroidism, unspecified: Secondary | ICD-10-CM

## 2012-10-14 DIAGNOSIS — I798 Other disorders of arteries, arterioles and capillaries in diseases classified elsewhere: Secondary | ICD-10-CM

## 2012-10-14 LAB — COMPREHENSIVE METABOLIC PANEL
ALT: 25 U/L (ref 0–35)
Albumin: 3.8 g/dL (ref 3.5–5.2)
CO2: 25 mEq/L (ref 19–32)
Calcium: 8.8 mg/dL (ref 8.4–10.5)
Chloride: 96 mEq/L (ref 96–112)
GFR: 52.63 mL/min — ABNORMAL LOW (ref >60.00)
Glucose, Bld: 494 mg/dL — ABNORMAL HIGH (ref 70–99)
Potassium: 3.9 mEq/L (ref 3.5–5.1)
Sodium: 130 mEq/L — ABNORMAL LOW (ref 135–145)
Total Bilirubin: 0.8 mg/dL (ref 0.3–1.2)
Total Protein: 7.4 g/dL (ref 6.0–8.3)

## 2012-10-14 LAB — POCT URINALYSIS DIPSTICK
Leukocytes, UA: NEGATIVE
Nitrite, UA: NEGATIVE
pH, UA: 5.5

## 2012-10-14 MED ORDER — INSULIN GLARGINE 100 UNIT/ML ~~LOC~~ SOLN: 40.0000 [IU] | Freq: Every day | SUBCUTANEOUS | Status: DC

## 2012-10-14 MED ORDER — LEVOTHYROXINE SODIUM 88 MCG PO TABS: 88.0000 ug | ORAL_TABLET | Freq: Every day | ORAL | Status: DC

## 2012-10-14 NOTE — Assessment & Plan Note (Signed)
BG markedly elevated, >500 in clinic today and at home. Recommended that pt be admitted to work on BG control, however she declines. Observed her taking her Novolog insulin in clinic today and she appears to be taking it correctly. Will set up home health referral for evaluation of how she is taking her medication. Will increase Lantus to 40units daily. Continue Novolog 5units tid prior to meals if BG>250. Follow up here in 2 weeks. Over spent in face to face contact with pt discussing DM and plan of care.

## 2012-10-14 NOTE — Progress Notes (Signed)
Subjective:    Patient ID: [name] [name], female    DOB: 05/10/1932, 79 y.o.   MRN: [id number]  HPI 79YO female with h/o DM, HTN, CAD, mesenteric ischemia presents for follow up. BG continue to be labile, with most recorded BG near 500. Pt also notes some BG<60 after taking Novolog 5 units. Reports she is compliant with Lantus 35units at night.  Reports feeling occasionally tired and lightheaded. No recurrent issues with abdominal pain. Son is living with her and has been trying to help with medications, although he is not present with her in clinic.  Outpatient Encounter Prescriptions as of 10/14/2012  Medication Sig Dispense Refill  . ALPRAZolam (XANAX) 0.5 MG tablet Take 1 tablet (0.5 mg total) by mouth 2 (two) times daily as needed for anxiety.  60 tablet  3  . HYDROcodone-acetaminophen (NORCO) 5-325 MG per tablet Take 1 tablet by mouth every 6 (six) hours as needed.        . insulin aspart (NOVOLOG FLEXPEN) 100 UNIT/ML injection Inject 5 Units into the skin 3 (three) times daily before meals. ONLY if blood sugar greater than 300  1 vial  6  . insulin glargine (LANTUS SOLOSTAR) 100 UNIT/ML injection Inject 0.4 mLs (40 Units total) into the skin at bedtime.  3 mL  11  . levothyroxine (SYNTHROID, LEVOTHROID) 100 MCG tablet Take 1 tablet (100 mcg total) by mouth daily.  90 tablet  1  . nystatin (NYSTOP) 100000 UNIT/GM POWD Apply 1 g (100,000 Units total) topically 2 (two) times daily.  60 g  0  . pantoprazole (PROTONIX) 40 MG tablet Take 1 tablet (40 mg total) by mouth 2 (two) times daily.  180 tablet  1  . [DISCONTINUED] insulin glargine (LANTUS SOLOSTAR) 100 UNIT/ML injection Inject 35 Units into the skin at bedtime.  3 mL  11  . fluconazole (DIFLUCAN) 150 MG tablet Take 1 tablet (150 mg total) by mouth once.  1 tablet  0  . levothyroxine (SYNTHROID, LEVOTHROID) 88 MCG tablet Take 1 tablet (88 mcg total) by mouth daily.  90 tablet  3   No facility-administered encounter medications on file as  of 10/14/2012.   BP 110/68  Pulse 90  Temp(Src) 98.1 F (36.7 C) (Oral)  Wt 159 lb (72.122 kg)  BMI 25.68 kg/m2  SpO2 90%  Review of Systems  Constitutional: Positive for fatigue. Negative for fever, chills, appetite change and unexpected weight change.  HENT: Negative for ear pain, congestion, sore throat, trouble swallowing, neck pain, voice change and sinus pressure.   Eyes: Negative for visual disturbance.  Respiratory: Negative for cough, shortness of breath, wheezing and stridor.   Cardiovascular: Negative for chest pain, palpitations and leg swelling.  Gastrointestinal: Negative for nausea, vomiting, abdominal pain, diarrhea, constipation, blood in stool, abdominal distention and anal bleeding.  Genitourinary: Negative for dysuria and flank pain.  Musculoskeletal: Negative for myalgias, arthralgias and gait problem.  Skin: Negative for color change and rash.  Neurological: Positive for light-headedness. Negative for dizziness and headaches.  Hematological: Negative for adenopathy. Does not bruise/bleed easily.  Psychiatric/Behavioral: Negative for suicidal ideas, sleep disturbance and dysphoric mood. The patient is not nervous/anxious.        Objective:   Physical Exam  Constitutional: She is oriented to person, place, and time. She appears well-developed and well-nourished. No distress.  HENT:  Head: Normocephalic and atraumatic.  Right Ear: External ear normal.  Left Ear: External ear normal.  Nose: Nose normal.  Mouth/Throat: Oropharynx is clear and moist.  No oropharyngeal exudate.  Eyes: Conjunctivae are normal. Pupils are equal, round, and reactive to light. Right eye exhibits no discharge. Left eye exhibits no discharge. No scleral icterus.  Neck: Normal range of motion. Neck supple. No tracheal deviation present. No thyromegaly present.  Cardiovascular: Normal rate, regular rhythm, normal heart sounds and intact distal pulses.  Exam reveals no gallop and no friction  rub.   No murmur heard. Pulmonary/Chest: Effort normal and breath sounds normal. No respiratory distress. She has no wheezes. She has no rales. She exhibits no tenderness.  Musculoskeletal: Normal range of motion. She exhibits no edema and no tenderness.  Lymphadenopathy:    She has no cervical adenopathy.  Neurological: She is alert and oriented to person, place, and time. No cranial nerve deficit. She exhibits normal muscle tone. Coordination normal.  Skin: Skin is warm and dry. No rash noted. She is not diaphoretic. No erythema. No pallor.  Psychiatric: She has a normal mood and affect. Her behavior is normal. Judgment and thought content normal.          Assessment & Plan:

## 2012-10-14 NOTE — Assessment & Plan Note (Signed)
Unclear that pt has been taking levothyroxine correctly. Reports "doubling up" on medication. Discussed taking Levothyroxine daily. Will repeat TSH with labs in 4 weeks.

## 2012-10-14 NOTE — Patient Instructions (Signed)
Please increase Lantus to 40units at bedtime.

## 2012-10-15 ENCOUNTER — Emergency Department: Payer: Self-pay | Admitting: Emergency Medicine

## 2012-10-15 ENCOUNTER — Telehealth: Payer: Self-pay | Admitting: Internal Medicine

## 2012-10-15 LAB — COMPREHENSIVE METABOLIC PANEL
Albumin: 3.3 g/dL — ABNORMAL LOW (ref 3.4–5.0)
Alkaline Phosphatase: 123 U/L (ref 50–136)
Anion Gap: 10 (ref 7–16)
BUN: 19 mg/dL — ABNORMAL HIGH (ref 7–18)
Calcium, Total: 8.7 mg/dL (ref 8.5–10.1)
Chloride: 100 mmol/L (ref 98–107)
Creatinine: 1.02 mg/dL (ref 0.60–1.30)
Osmolality: 291 (ref 275–301)
Potassium: 4.5 mmol/L (ref 3.5–5.1)
SGOT(AST): 35 U/L (ref 15–37)
Sodium: 133 mmol/L — ABNORMAL LOW (ref 136–145)
Total Protein: 7.5 g/dL (ref 6.4–8.2)

## 2012-10-15 LAB — CBC
HCT: 34 % — ABNORMAL LOW (ref 35.0–47.0)
MCH: 30.8 pg (ref 26.0–34.0)
MCV: 95 fL (ref 80–100)
Platelet: 255 10*3/uL (ref 150–440)
RBC: 3.57 10*6/uL — ABNORMAL LOW (ref 3.80–5.20)
RDW: 15.1 % — ABNORMAL HIGH (ref 11.5–14.5)
WBC: 6.4 10*3/uL (ref 3.6–11.0)

## 2012-10-15 LAB — URINALYSIS, COMPLETE
Bilirubin,UR: NEGATIVE
Blood: NEGATIVE
Glucose,UR: >=500 mg/dL (ref 0–75)
Ketone: NEGATIVE
Ph: 5 (ref 4.5–8.0)
RBC,UR: NONE SEEN /HPF (ref 0–5)
Specific Gravity: 1.029 (ref 1.003–1.030)
Squamous Epithelial: 1
WBC UR: 1 /HPF (ref 0–5)

## 2012-10-15 NOTE — Telephone Encounter (Signed)
URGENT Caller: Tonya/Child; Phone: [phone]; Reason for Call: She wants to know if the pt needs to be admitted through the ED or admitting.

## 2012-10-19 ENCOUNTER — Telehealth: Payer: Self-pay | Admitting: Internal Medicine

## 2012-10-19 NOTE — Telephone Encounter (Signed)
Spoke with patient and she stated she received 88 mcg from the pharmacist but she was told at her last OV to take 100 mcg. Which is the correct strength for her thyroid medication? Please read below also

## 2012-10-19 NOTE — Telephone Encounter (Signed)
Patient informed and offered her an appointment to come in to be seen for her feeling confused, she declined. Stated she will wait until the 4th, she is getting too many doctor bills already.

## 2012-10-19 NOTE — Telephone Encounter (Signed)
Let's have her stick with of Levothyroxine. She was confused before and she was taking Levothyroxine two pills daily. We will recheck thyroid function at next visit. In regards to the blood sugar of 80, that is normal. She should not be confused at BG of 80. If confused or having other issues, needs to be seen.

## 2012-10-19 NOTE — Telephone Encounter (Signed)
Pt states thyroid medication is the wrong dosage.  States Dr. Dan Humphreys wanted her to take 100 mcg and they said she was Rx'd 88 mcg.  Pt has an appt on 4/4 to see Dr. Dan Humphreys and pt wants to know if she can wait that long for the hospital f/u appt.  Pt states when she woke up this morning her blood sugar was 80 and she was out of it.  Pt would like a call back regarding these issues.

## 2012-10-29 ENCOUNTER — Encounter: Payer: Self-pay | Admitting: Internal Medicine

## 2012-10-29 ENCOUNTER — Ambulatory Visit (INDEPENDENT_AMBULATORY_CARE_PROVIDER_SITE_OTHER): Payer: Medicare Other | Admitting: Internal Medicine

## 2012-10-29 VITALS — BP 100/60 | HR 81 | Temp 97.5°F | Wt 162.0 lb

## 2012-10-29 DIAGNOSIS — I798 Other disorders of arteries, arterioles and capillaries in diseases classified elsewhere: Secondary | ICD-10-CM

## 2012-10-29 DIAGNOSIS — E785 Hyperlipidemia, unspecified: Secondary | ICD-10-CM

## 2012-10-29 DIAGNOSIS — R5383 Other fatigue: Secondary | ICD-10-CM

## 2012-10-29 DIAGNOSIS — E1159 Type 2 diabetes mellitus with other circulatory complications: Secondary | ICD-10-CM

## 2012-10-29 DIAGNOSIS — E1151 Type 2 diabetes mellitus with diabetic peripheral angiopathy without gangrene: Secondary | ICD-10-CM

## 2012-10-29 NOTE — Assessment & Plan Note (Signed)
Likely related to ongoing hyperglycemia. Recent TSH normal. Will continue to work on nutrition and BG control.

## 2012-10-29 NOTE — Progress Notes (Signed)
Subjective:    Patient ID: [name] [name], female    DOB: Dec 05, 1931, 79 y.o.   MRN: [id number]  HPI 79YO female with DM, CAD, HL, hypothyroidism presents for follow up.  DM - Blood sugars elevated historically with last A1c 13%. Did not increase Lantus to 40units daily as directed. BG running in 80s in the morning and >300 in the evenings.  She is trying to improve diet with limited sugars. HH nurse working with her.  Notes some continued fatigue. Questions what previous cholesterol values were and what alternatives there might be to statin drugs.  Outpatient Encounter Prescriptions as of 10/29/2012  Medication Sig Dispense Refill  . insulin aspart (NOVOLOG FLEXPEN) 100 UNIT/ML injection Inject 5 Units into the skin 3 (three) times daily before meals. ONLY if blood sugar greater than 300  1 vial  6  . insulin glargine (LANTUS SOLOSTAR) 100 UNIT/ML injection Inject 0.4 mLs (40 Units total) into the skin at bedtime.  3 mL  11  . levothyroxine (SYNTHROID, LEVOTHROID) 88 MCG tablet Take 1 tablet (88 mcg total) by mouth daily.  90 tablet  3  . pantoprazole (PROTONIX) 40 MG tablet Take 1 tablet (40 mg total) by mouth 2 (two) times daily.  180 tablet  1   No facility-administered encounter medications on file as of 10/29/2012.   BP 100/60  Pulse 81  Temp(Src) 97.5 F (36.4 C) (Oral)  Wt 162 lb (73.483 kg)  BMI 26.16 kg/m2  SpO2 96%  Review of Systems  Constitutional: Positive for fatigue. Negative for fever, chills, appetite change and unexpected weight change.  HENT: Negative for ear pain, congestion, sore throat, trouble swallowing, neck pain, voice change and sinus pressure.   Eyes: Negative for visual disturbance.  Respiratory: Negative for cough, shortness of breath, wheezing and stridor.   Cardiovascular: Negative for chest pain, palpitations and leg swelling.  Gastrointestinal: Negative for nausea, vomiting, abdominal pain, diarrhea, constipation, blood in stool, abdominal distention  and anal bleeding.  Genitourinary: Negative for dysuria and flank pain.  Musculoskeletal: Negative for myalgias, arthralgias and gait problem.  Skin: Negative for color change and rash.  Neurological: Negative for dizziness and headaches.  Hematological: Negative for adenopathy. Does not bruise/bleed easily.  Psychiatric/Behavioral: Negative for suicidal ideas, sleep disturbance and dysphoric mood. The patient is not nervous/anxious.        Objective:   Physical Exam  Constitutional: She is oriented to person, place, and time. She appears well-developed and well-nourished. No distress.  HENT:  Head: Normocephalic and atraumatic.  Right Ear: External ear normal.  Left Ear: External ear normal.  Nose: Nose normal.  Mouth/Throat: Oropharynx is clear and moist. No oropharyngeal exudate.  Eyes: Conjunctivae are normal. Pupils are equal, round, and reactive to light. Right eye exhibits no discharge. Left eye exhibits no discharge. No scleral icterus.  Neck: Normal range of motion. Neck supple. No tracheal deviation present. No thyromegaly present.  Cardiovascular: Normal rate, regular rhythm, normal heart sounds and intact distal pulses.  Exam reveals no gallop and no friction rub.   No murmur heard. Pulmonary/Chest: Effort normal and breath sounds normal. No respiratory distress. She has no wheezes. She has no rales. She exhibits no tenderness.  Musculoskeletal: Normal range of motion. She exhibits no edema and no tenderness.  Lymphadenopathy:    She has no cervical adenopathy.  Neurological: She is alert and oriented to person, place, and time. No cranial nerve deficit. She exhibits normal muscle tone. Coordination normal.  Skin: Skin is warm and  dry. No rash noted. She is not diaphoretic. No erythema. No pallor.  Psychiatric: She has a normal mood and affect. Her behavior is normal. Judgment and thought content normal.          Assessment & Plan:

## 2012-10-29 NOTE — Assessment & Plan Note (Signed)
Reviewed recent cholesterol numbers with pt at her request. Lipids elevated. Pt unable to tolerate statins because of severe myalgia on these medications. Recommended diet low in saturated fat and high in fiber. Discussed increasing physical activity.

## 2012-10-29 NOTE — Assessment & Plan Note (Signed)
Very poor control of BG historically, however recent BG improved <300 mostly. Having lows in morning and elevated BG in evening. Will try dividing Lantus dose 20 units qam and 20units qpm. Will set up Diabetes Education. Follow up 2 weeks.

## 2012-10-29 NOTE — Patient Instructions (Signed)
Divide lantus to 20units twice daily. Continue to monitor blood sugars 2-3 times per day. Follow up 2 weeks.

## 2012-11-17 ENCOUNTER — Other Ambulatory Visit: Payer: Self-pay | Admitting: *Deleted

## 2012-11-17 DIAGNOSIS — K219 Gastro-esophageal reflux disease without esophagitis: Secondary | ICD-10-CM

## 2012-11-17 MED ORDER — PANTOPRAZOLE SODIUM 40 MG PO TBEC: 40.0000 mg | DELAYED_RELEASE_TABLET | Freq: Two times a day (BID) | ORAL | Status: DC

## 2012-11-17 NOTE — Telephone Encounter (Signed)
Eprescribed.

## 2012-12-02 ENCOUNTER — Encounter: Payer: Self-pay | Admitting: Internal Medicine

## 2012-12-02 ENCOUNTER — Ambulatory Visit (INDEPENDENT_AMBULATORY_CARE_PROVIDER_SITE_OTHER): Payer: Medicare Other | Admitting: Internal Medicine

## 2012-12-02 VITALS — BP 106/62 | HR 99 | Temp 97.9°F | Wt 161.0 lb

## 2012-12-02 DIAGNOSIS — E1159 Type 2 diabetes mellitus with other circulatory complications: Secondary | ICD-10-CM

## 2012-12-02 DIAGNOSIS — E1151 Type 2 diabetes mellitus with diabetic peripheral angiopathy without gangrene: Secondary | ICD-10-CM

## 2012-12-02 DIAGNOSIS — R5383 Other fatigue: Secondary | ICD-10-CM

## 2012-12-02 DIAGNOSIS — K5900 Constipation, unspecified: Secondary | ICD-10-CM | POA: Insufficient documentation

## 2012-12-02 DIAGNOSIS — I798 Other disorders of arteries, arterioles and capillaries in diseases classified elsewhere: Secondary | ICD-10-CM

## 2012-12-02 DIAGNOSIS — K219 Gastro-esophageal reflux disease without esophagitis: Secondary | ICD-10-CM

## 2012-12-02 MED ORDER — INSULIN GLARGINE 100 UNIT/ML ~~LOC~~ SOLN: 20.0000 [IU] | Freq: Two times a day (BID) | SUBCUTANEOUS | Status: DC

## 2012-12-02 NOTE — Assessment & Plan Note (Signed)
Chronic constipation. Discussed using MiraLax 17 g 1-2 times daily on a regular basis to help soften stool. Pt will call if symptoms are not improving.

## 2012-12-02 NOTE — Assessment & Plan Note (Signed)
Persistent symptoms of generalized fatigue. Likely related to ongoing hyperglycemia. Recent TSH was normal. Will check CBC, CMP with labs today.

## 2012-12-02 NOTE — Assessment & Plan Note (Signed)
Blood sugar slightly improved based on patient record. Will recheck A1c with labs today. Continue Lantus 20 units twice daily and NovoLog as needed 5 units for blood sugar greater than 250.

## 2012-12-02 NOTE — Progress Notes (Signed)
Subjective:    Patient ID: [name] [name], female    DOB: 29-Mar-1932, 79 y.o.   MRN: [id number]  HPI 79 year old female with history of poorly controlled diabetes mellitus, hypothyroidism, coronary artery disease, mesenteric ischemia presents for followup. She brings record of her blood sugars which are slightly improved compared to previous. Blood sugars typically near 200. She does have a few outliers near 400 and couple of low blood sugars near 40. She has been taking Lantus 20 units twice daily. She has been evaluated by home health nurse on one occasion.  She reports ongoing symptoms of generalized fatigue. She denies any focal chest pain, shortness of breath, change in appetite. She does have chronic epigastric pain which has been persistent despite adding pantoprazole. She denies any blood in her stool or black stool. She does have chronic constipation for which she takes over-the-counter medications with some improvement.  Outpatient Encounter Prescriptions as of 12/02/2012  Medication Sig Dispense Refill  . insulin aspart (NOVOLOG FLEXPEN) 100 UNIT/ML injection Inject 5 Units into the skin 3 (three) times daily before meals. ONLY if blood sugar greater than 300  1 vial  6  . insulin glargine (LANTUS) 100 UNIT/ML injection Inject 0.2 mLs (20 Units total) into the skin 2 (two) times daily.  3 mL  11  . levothyroxine (SYNTHROID, LEVOTHROID) 88 MCG tablet Take 1 tablet (88 mcg total) by mouth daily.  90 tablet  3  . pantoprazole (PROTONIX) 40 MG tablet Take 1 tablet (40 mg total) by mouth 2 (two) times daily.  180 tablet  1  . [DISCONTINUED] insulin glargine (LANTUS SOLOSTAR) 100 UNIT/ML injection Inject 0.4 mLs (40 Units total) into the skin at bedtime.  3 mL  11   No facility-administered encounter medications on file as of 12/02/2012.   BP 106/62  Pulse 99  Temp(Src) 97.9 F (36.6 C) (Oral)  Wt 161 lb (73.029 kg)  BMI 26 kg/m2  SpO2 92%  Review of Systems  Constitutional: Positive for  fatigue. Negative for fever, chills, appetite change and unexpected weight change.  HENT: Negative for ear pain, congestion, sore throat, trouble swallowing, neck pain, voice change and sinus pressure.   Eyes: Negative for visual disturbance.  Respiratory: Negative for cough, shortness of breath, wheezing and stridor.   Cardiovascular: Negative for chest pain, palpitations and leg swelling.  Gastrointestinal: Positive for abdominal pain. Negative for nausea, vomiting, diarrhea, constipation, blood in stool, abdominal distention and anal bleeding.  Genitourinary: Negative for dysuria and flank pain.  Musculoskeletal: Negative for myalgias, arthralgias and gait problem.  Skin: Negative for color change and rash.  Neurological: Negative for dizziness and headaches.  Hematological: Negative for adenopathy. Does not bruise/bleed easily.  Psychiatric/Behavioral: Negative for suicidal ideas, sleep disturbance and dysphoric mood. The patient is not nervous/anxious.        Objective:   Physical Exam  Constitutional: She is oriented to person, place, and time. She appears well-developed and well-nourished. No distress.  HENT:  Head: Normocephalic and atraumatic.  Right Ear: External ear normal.  Left Ear: External ear normal.  Nose: Nose normal.  Mouth/Throat: Oropharynx is clear and moist. No oropharyngeal exudate.  Eyes: Conjunctivae are normal. Pupils are equal, round, and reactive to light. Right eye exhibits no discharge. Left eye exhibits no discharge. No scleral icterus.  Neck: Normal range of motion. Neck supple. No tracheal deviation present. No thyromegaly present.  Cardiovascular: Normal rate, regular rhythm, normal heart sounds and intact distal pulses.  Exam reveals no gallop and no  friction rub.   No murmur heard. Pulmonary/Chest: Effort normal and breath sounds normal. No accessory muscle usage. Not tachypneic. No respiratory distress. She has no decreased breath sounds. She has no  wheezes. She has no rhonchi. She has no rales. She exhibits no tenderness.  Abdominal: Soft. Bowel sounds are normal. She exhibits no distension and no mass. Tenderness: epigastric. There is no rebound and no guarding.  Musculoskeletal: Normal range of motion. She exhibits no edema and no tenderness.  Lymphadenopathy:    She has no cervical adenopathy.  Neurological: She is alert and oriented to person, place, and time. No cranial nerve deficit. She exhibits normal muscle tone. Coordination normal.  Skin: Skin is warm and dry. No rash noted. She is not diaphoretic. No erythema. No pallor.  Psychiatric: She has a normal mood and affect. Her behavior is normal. Judgment and thought content normal.          Assessment & Plan:

## 2012-12-02 NOTE — Assessment & Plan Note (Signed)
Persistent symptoms of epigastric pain and burning despite use of pantoprazole. Will set up GI evaluation for endoscopy.

## 2012-12-03 ENCOUNTER — Telehealth: Payer: Self-pay | Admitting: *Deleted

## 2012-12-03 DIAGNOSIS — E1151 Type 2 diabetes mellitus with diabetic peripheral angiopathy without gangrene: Secondary | ICD-10-CM

## 2012-12-03 LAB — CBC WITH DIFFERENTIAL/PLATELET
Eosinophils Absolute: 0.3 10*3/uL (ref 0.0–0.7)
Eosinophils Relative: 5.5 % — ABNORMAL HIGH (ref 0.0–5.0)
Lymphocytes Relative: 27.1 % (ref 12.0–46.0)
MCHC: 33.9 g/dL (ref 30.0–36.0)
MCV: 94.8 fl (ref 78.0–100.0)
Monocytes Absolute: 0.3 10*3/uL (ref 0.1–1.0)
Neutrophils Relative %: 61.3 % (ref 43.0–77.0)
Platelets: 269 10*3/uL (ref 150.0–400.0)
RBC: 3.76 Mil/uL — ABNORMAL LOW (ref 3.87–5.11)
WBC: 5.6 10*3/uL (ref 4.5–10.5)

## 2012-12-03 LAB — HEMOGLOBIN A1C: Hgb A1c MFr Bld: 12.1 % — ABNORMAL HIGH (ref 4.6–6.5)

## 2012-12-03 LAB — COMPREHENSIVE METABOLIC PANEL
ALT: 28 U/L (ref 0–35)
AST: 29 U/L (ref 0–37)
BUN: 15 mg/dL (ref 6–23)
Calcium: 8.9 mg/dL (ref 8.4–10.5)
Chloride: 100 mEq/L (ref 96–112)
Creatinine, Ser: 1.3 mg/dL — ABNORMAL HIGH (ref 0.4–1.2)
GFR: 40.58 mL/min — ABNORMAL LOW (ref >60.00)
Total Bilirubin: 0.5 mg/dL (ref 0.3–1.2)

## 2012-12-03 NOTE — Telephone Encounter (Signed)
She is fine with the referral to endocrinology and she will be leaving for [place] on 7/6.

## 2012-12-22 ENCOUNTER — Encounter: Payer: Self-pay | Admitting: Internal Medicine

## 2012-12-22 ENCOUNTER — Ambulatory Visit (INDEPENDENT_AMBULATORY_CARE_PROVIDER_SITE_OTHER): Payer: Medicare Other | Admitting: Internal Medicine

## 2012-12-22 VITALS — BP 112/58 | HR 90 | Temp 98.0°F | Resp 12 | Ht 65.0 in | Wt 159.0 lb

## 2012-12-22 DIAGNOSIS — IMO0002 Reserved for concepts with insufficient information to code with codable children: Secondary | ICD-10-CM

## 2012-12-22 DIAGNOSIS — I798 Other disorders of arteries, arterioles and capillaries in diseases classified elsewhere: Secondary | ICD-10-CM

## 2012-12-22 DIAGNOSIS — E1151 Type 2 diabetes mellitus with diabetic peripheral angiopathy without gangrene: Secondary | ICD-10-CM

## 2012-12-22 DIAGNOSIS — E1159 Type 2 diabetes mellitus with other circulatory complications: Secondary | ICD-10-CM

## 2012-12-22 LAB — GLUCOSE, POCT (MANUAL RESULT ENTRY): POC Glucose: 299 mg/dl — AB (ref 70–99)

## 2012-12-22 NOTE — Patient Instructions (Addendum)
Please return in 1 month with your sugar log.  Decrease the Lantus to 30 units every morning. Do not take Lantus at night. Please take the NovoLog as follows:  - <100: no NovoLog - 100-150: 4 units - 151-175: 5 units - 176-200: 6 units  - 201-225: 7 units  - > 225: 8 units  If you eat a smaller meal, please use less Novolog by 2 units.  PATIENT INSTRUCTIONS FOR TYPE 2 DIABETES:  **Please join MyChart!** - see attached instructions about how to join   DIET AND EXERCISE Diet and exercise is an important part of diabetic treatment.  We recommended aerobic exercise in the form of brisk walking (working between 40-60% of maximal aerobic capacity, similar to brisk walking) for 150 minutes per week (such as 30 minutes five days per week) along with 3 times per week performing 'resistance' training (using various gauge rubber tubes with handles) 5-10 exercises involving the major muscle groups (upper body, lower body and core) performing 10-15 repetitions (or near fatigue) each exercise. Start at half the above goal but build slowly to reach the above goals. If limited by weight, joint pain, or disability, we recommend daily walking in a swimming pool with water up to waist to reduce pressure from joints while allow for adequate exercise.    BLOOD GLUCOSES Monitoring your blood glucoses is important for continued management of your diabetes. Please check your blood glucoses 2-4 times a day: fasting, before meals and at bedtime (you can rotate these measurements - e.g. one day check before the 3 meals, the next day check before 2 of the meals and before bedtime, etc.   HYPOGLYCEMIA (low blood sugar) Hypoglycemia is usually a reaction to not eating, exercising, or taking too much insulin/ other diabetes drugs.  Symptoms include tremors, sweating, hunger, confusion, headache, etc. Treat IMMEDIATELY with 15 grams of Carbs:   4 glucose tablets    cup regular juice/soda   2 tablespoons raisins   4  teaspoons sugar   1 tablespoon honey Recheck blood glucose in 15 mins and repeat above if still symptomatic/blood glucose <100. Please contact our office at [phone] if you have questions about how to next handle your insulin.  RECOMMENDATIONS TO REDUCE YOUR RISK OF DIABETIC COMPLICATIONS: * Take your prescribed MEDICATION(S). * Follow a DIABETIC diet: Complex carbs, fiber rich foods, heart healthy fish twice weekly, (monounsaturated and polyunsaturated) fats * AVOID saturated/trans fats, high fat foods, >2,300 mg salt per day. * EXERCISE at least 5 times a week for 30 minutes or preferably daily.  * DO NOT SMOKE OR DRINK more than 1 drink a day. * Check your FEET every day. Do not wear tightfitting shoes. Contact us if you develop an ulcer * See your EYE doctor once a year or more if needed * Get a FLU shot once a year * Get a PNEUMONIA vaccine once before and once after age 78 years  GOALS:  * Your Hemoglobin A1c of <7%  * Your Systolic BP should be 140 or lower  * Your Diastolic BP should be 80 or lower  * Your HDL (Good Cholesterol) should be 40 or higher  * Your LDL (Bad Cholesterol) should be 100 or lower  * Your Triglycerides should be 150 or lower  * Your Urine microalbumin (kidney function) should be <30 * Your Body Mass Index should be 25 or lower   We will be glad to help you achieve these goals. Our telephone number is: [phone].

## 2012-12-22 NOTE — Progress Notes (Signed)
Patient ID: [name] [name], female   DOB: 22-Jun-1932, 79 y.o.   MRN: [id number]  HPI: [name] [name] is a 79 y.o.-year-old female, referred by her PCP, Dr.Walker, for management of DM2, insulin-dependent, uncontrolled, with complications (CAD - s/p MI, PVD, stage 3 CKD, peripheral neuropathy). This was a difficult appt, pt appeared confused, she was late for the appt and told me she did not feel well, but could not describe why (CBG in the office 299)  Patient has been diagnosed with diabetes in 2009; she was started on insulin in 2011. Last hemoglobin A1c was: Lab Results  Component Value Date   HGBA1C 12.1* 12/02/2012   previously, it was 13.3, previously 12.3.  Pt is on a regimen of: - Lantus 20 units bid sugars improved slightly after speaking the Lantus on 10/29/2012 - Novolog 5 units tid ac only if sugars >250  Pt checks her sugars 2-4 a day and they are widely fluctuating: - am: 70-212 (at 70, 2-3x a month) - 1h after breakfast: 200s - before lunch: 250-300 - before dinner:300s-400s - after dinner: 400s-500s  She has many lows in her log, LO-70s (only in am). Lowest sugar was LO x1 ; she has hypoglycemia awareness at 70. Highest sugar was >500.                                                                                                                                                                                                 Pt's meals are: - Breakfast: egg + [place] bacon, toast, sometimes cheese - Lunch: [place] sandwich on whole wheat bread, a little mayonaise - Dinner: salad + [employer], hamburger steak + salad, etc. - Snacks: none  Pt does has chronic kidney disease, last BUN/creatinine was:  Lab Results  Component Value Date   BUN 15 12/02/2012   CREATININE 1.3* 12/02/2012   Last set of lipids: Lab Results  Component Value Date   CHOL 290* 09/16/2012   HDL 104.10 09/16/2012   LDLDIRECT 142.8 09/16/2012   TRIG 75.0 09/16/2012   CHOLHDL 3 09/16/2012   Pt's last  eye exam was in 04/2012:  macular degeneration and background DR OD. She has numbness and tingling in her toes and side of feet.  I reviewed her chart and she also has a history of GERD, hypothyroidism-on levothyroxine 88, history of mesenteric ischemia, history of CIS of the breast - status post right mastectomy, history of bowel obstruction - status post colectomy 05/2012, hyperlipidemia, anxiety, osteoporosis, history of noncompliance with meds.  Pt has FH of DM in mother.  ROS: Constitutional: no weight gain/loss, + fatigue, +  subjective hypothermia, + poor sleep, + excessive urination, + nocturia > 1 Eyes: no blurry vision, no xerophthalmia ENT: no sore throat, no nodules palpated in throat, occas. Dysphagia/no odynophagia, no hoarseness; + hypoacusis Cardiovascular: no CP/SOB/palpitations/leg swelling Respiratory: no cough/+ SOB Gastrointestinal: no N/V/D/+ C Musculoskeletal: no muscle/joint aches Skin: no rashes; + easy bruising Neurological: no tremors/numbness/tingling/dizziness Psychiatric: no depression/anxiety Low libido  Past Medical History  Diagnosis Date  . Diabetes mellitus   . Cancer     2004 Right breast, found on mammogram, radiation therapy, Dr. Lavell Islam, uterine cancer,   . OSA (obstructive sleep apnea)   . Osteoporosis 02/21/09    DEXA scan showed osteoporosis with left femur T-score -2.8.  Marland Kitchen Thyroid disease     Hypothyroid  . Hyperlipidemia   . CAD (coronary artery disease)   . Anxiety state, unspecified   . Vitamin D deficiency   . Esophageal reflux   . Myocardial infarction     Cath negative except for 40% occlusion LAD.  Pt not candidate for betablocker or ACEI because of hypotension  . Hypotension   . Presbyacusis   . COPD (chronic obstructive pulmonary disease)    Past Surgical History  Procedure Laterality Date  . Abdominal hysterectomy      uterine cancer   History   Social History  . Marital Status: Married    Spouse Name: N/A    Number  of Children: N/A  . Years of Education: N/A   Occupational History  . Not on file.   Social History Main Topics  . Smoking status: Never Smoker   . Smokeless tobacco: Never Used  . Alcohol Use: No  . Drug Use: No   Current Outpatient Prescriptions on File Prior to Visit  Medication Sig Dispense Refill  . insulin aspart (NOVOLOG FLEXPEN) 100 UNIT/ML injection Inject 5 Units into the skin 3 (three) times daily before meals. ONLY if blood sugar greater than 300  1 vial  6  . insulin glargine (LANTUS) 100 UNIT/ML injection Inject 0.2 mLs (20 Units total) into the skin 2 (two) times daily.  3 mL  11  . levothyroxine (SYNTHROID, LEVOTHROID) 88 MCG tablet Take 1 tablet (88 mcg total) by mouth daily.  90 tablet  3  . pantoprazole (PROTONIX) 40 MG tablet Take 1 tablet (40 mg total) by mouth 2 (two) times daily.  180 tablet  1   No current facility-administered medications on file prior to visit.   Allergies  Allergen Reactions  . Penicillins    PE: BP 112/58  Pulse 90  Temp(Src) 98 F (36.7 C) (Oral)  Resp 12  Ht 5\' 5"  (1.651 m)  Wt 159 lb (72.122 kg)  BMI 26.46 kg/m2  SpO2 95% Wt Readings from Last 3 Encounters:  12/22/12 159 lb (72.122 kg)  12/02/12 161 lb (73.029 kg)  10/29/12 162 lb (73.483 kg)   Constitutional: normal weight, in NAD, but appearing confused Eyes: PERRLA, EOMI, no exophthalmos ENT: moist mucous membranes, no thyromegaly, no cervical lymphadenopathy Cardiovascular: RRR, No MRG Respiratory: CTA B Gastrointestinal: abdomen soft, NT, ND, BS+ Musculoskeletal: no deformities, strength intact in all 4 Skin: moist, warm, no rashes Neurological: no tremor with outstretched hands  ASSESSMENT: 1. DM2, insulin-dependent, uncontrolled, with complications - CAD - s/p MI  - PVD - stage 3 CKD - peripheral neuropathy  PLAN:  1. Pt with uncontrolled diabetes, on basal-bolus insulin regimen, however, basal insulin TDD > rapid-acting TDD. She is only taking her  NovoLog if her sugars are >250,  but, unfortunately, I do not believe she has enough insulin reserve to cover her mealtimes. Based on her very high sugars in the second part of the day, she behaves like a type 1 diabetic.  - We discussed about adding insulin for all of her meals, and also to add a SSI. I will also decrease her Lantus to avoid the lows in am.  - To simplify her regimen, will leave her with only one Lantus dose, of 30, in am. I gave her the following instructions: Decrease the Lantus to 30 units every morning. Do not take Lantus at night. Please take the NovoLog as follows:  - <100: no NovoLog - 100-150: 4 units - 151-175: 5 units - 176-200: 6 units  - 201-225: 7 units  - > 225: 8 units  If you eat a smaller meal, please use less Novolog by 2 units. - she appears to have understood the instructions - given foot care handout and explained the principles - given instructions for hypoglycemia management "15-15 rule" - I will see her back in 1 month with her sugar log - continue to check 2-4 x a day

## 2012-12-30 ENCOUNTER — Ambulatory Visit (INDEPENDENT_AMBULATORY_CARE_PROVIDER_SITE_OTHER): Payer: Medicare Other | Admitting: General Surgery

## 2012-12-30 ENCOUNTER — Encounter: Payer: Self-pay | Admitting: General Surgery

## 2012-12-30 VITALS — BP 126/70 | HR 72 | Resp 12 | Ht 65.0 in | Wt 161.0 lb

## 2012-12-30 DIAGNOSIS — K219 Gastro-esophageal reflux disease without esophagitis: Secondary | ICD-10-CM

## 2012-12-30 NOTE — Progress Notes (Signed)
Patient ID: [name] [name], female   DOB: 08-04-1931, 79 y.o.   MRN: [id number]  Chief Complaint  Patient presents with  . Other    Gred    HPI [name] [name] is a 79 y.o. female here today for trouble swelling,constipation and burning in the top of her stomach. Patient reports this has been more prominent the past few months. She has been using protonix. Reports having  an endoscopy yrs ago- date unknown.Patient has history of breast cancer has not had her mammogram his year. Also reports having colon surgery last year details are sketchy.  HPI  Past Medical History  Diagnosis Date  . Diabetes mellitus   . Cancer     2004 Right breast, found on mammogram, radiation therapy, Dr. Lavell Islam, uterine cancer,   . OSA (obstructive sleep apnea)   . Osteoporosis 02/21/09    DEXA scan showed osteoporosis with left femur T-score -2.8.  Marland Kitchen Thyroid disease     Hypothyroid  . Hyperlipidemia   . CAD (coronary artery disease)   . Anxiety state, unspecified   . Vitamin D deficiency   . Esophageal reflux   . Myocardial infarction     Cath negative except for 40% occlusion LAD.  Pt not candidate for betablocker or ACEI because of hypotension  . Hypotension   . Presbyacusis   . COPD (chronic obstructive pulmonary disease)   . Hearing loss   . IBS (irritable bowel syndrome)   . Heart burn     Past Surgical History  Procedure Laterality Date  . Abdominal hysterectomy      uterine cancer  . Breast surgery    . Colon surgery  2013    done at [facility]    History reviewed. No pertinent family history.  Social History History  Substance Use Topics  . Smoking status: Never Smoker   . Smokeless tobacco: Never Used  . Alcohol Use: No    Allergies  Allergen Reactions  . Penicillins     Current Outpatient Prescriptions  Medication Sig Dispense Refill  . insulin aspart (NOVOLOG FLEXPEN) 100 UNIT/ML injection Inject 5 Units into the skin 3 (three) times daily before meals. ONLY if blood sugar  greater than 300  1 vial  6  . insulin glargine (LANTUS) 100 UNIT/ML injection Inject 0.2 mLs (20 Units total) into the skin 2 (two) times daily.  3 mL  11  . levothyroxine (SYNTHROID, LEVOTHROID) 88 MCG tablet Take 1 tablet (88 mcg total) by mouth daily.  90 tablet  3  . pantoprazole (PROTONIX) 40 MG tablet Take 1 tablet (40 mg total) by mouth 2 (two) times daily.  180 tablet  1   No current facility-administered medications for this visit.    Review of Systems Review of Systems  Constitutional: Negative.   Respiratory: Negative.   Cardiovascular: Negative.   Gastrointestinal: Positive for abdominal pain and constipation. Negative for nausea, vomiting, diarrhea, blood in stool, anal bleeding and rectal pain.    Blood pressure 126/70, pulse 72, resp. rate 12, height 5\' 5"  (1.651 m), weight 161 lb (73.029 kg).  Physical Exam Physical Exam  Constitutional: She appears well-developed and well-nourished.  Eyes: Conjunctivae are normal. No scleral icterus.  Neck: Trachea normal. Neck supple. No mass and no thyromegaly present.  Cardiovascular: Normal rate and regular rhythm.   Pulmonary/Chest: Breath sounds normal.  Abdominal: Soft. There is no hepatomegaly. There is no tenderness. No hernia.    Data Reviewed None   Assessment  prime complaint is dysphagia  Plan   W#ill Get information on her colon surgery and breast follow up and plan for upper endoscopy. Pt advised       Larri Brewton G 12/31/2012, 6:12 AM

## 2012-12-31 ENCOUNTER — Encounter: Payer: Self-pay | Admitting: General Surgery

## 2012-12-31 NOTE — Patient Instructions (Signed)
Pt will be advised after review  of her colon surgery from November last yr and also her mammogram status. Plan for upper endoscopy.

## 2013-01-03 ENCOUNTER — Encounter: Payer: Self-pay | Admitting: Internal Medicine

## 2013-01-03 ENCOUNTER — Ambulatory Visit (INDEPENDENT_AMBULATORY_CARE_PROVIDER_SITE_OTHER): Payer: Medicare Other | Admitting: Internal Medicine

## 2013-01-03 VITALS — BP 120/80 | HR 72 | Temp 98.2°F | Wt 161.0 lb

## 2013-01-03 DIAGNOSIS — E1159 Type 2 diabetes mellitus with other circulatory complications: Secondary | ICD-10-CM

## 2013-01-03 DIAGNOSIS — I798 Other disorders of arteries, arterioles and capillaries in diseases classified elsewhere: Secondary | ICD-10-CM

## 2013-01-03 DIAGNOSIS — E1151 Type 2 diabetes mellitus with diabetic peripheral angiopathy without gangrene: Secondary | ICD-10-CM

## 2013-01-03 NOTE — Assessment & Plan Note (Addendum)
BG much improved per pt report. Will continue Lantus and Novolog. Pt will follow up with Dr. Elvera Lennox in 01/2013 and here for repeat A1c in 02/2013.

## 2013-01-03 NOTE — Progress Notes (Signed)
Subjective:    Patient ID: [name] [name], female    DOB: 12/25/1931, 79 y.o.   MRN: [id number]  HPI 79YO female with h/o CAD, DM, HTN, hypothyroidism presents for follow up. Blood sugars previously poorly controlled with A1c >12%. Recently seen by Dr. Elvera Lennox and started on sliding scale with Novolog. Pt reports she has tolerated this well. Denies any recent BG<70 or persistently >250, however did not bring record today. Feeling better and energy improved. No new concerns today.  Outpatient Encounter Prescriptions as of 01/03/2013  Medication Sig Dispense Refill  . insulin aspart (NOVOLOG FLEXPEN) 100 UNIT/ML injection Inject 5 Units into the skin 3 (three) times daily before meals. ONLY if blood sugar greater than 300  1 vial  6  . insulin glargine (LANTUS) 100 UNIT/ML injection Inject 0.2 mLs (20 Units total) into the skin 2 (two) times daily.  3 mL  11  . levothyroxine (SYNTHROID, LEVOTHROID) 88 MCG tablet Take 1 tablet (88 mcg total) by mouth daily.  90 tablet  3  . pantoprazole (PROTONIX) 40 MG tablet Take 1 tablet (40 mg total) by mouth 2 (two) times daily.  180 tablet  1   No facility-administered encounter medications on file as of 01/03/2013.   BP 120/80  Pulse 72  Temp(Src) 98.2 F (36.8 C) (Oral)  Wt 161 lb (73.029 kg)  BMI 26.79 kg/m2  SpO2 95%  Review of Systems  Constitutional: Negative for fever, chills, appetite change, fatigue and unexpected weight change.  HENT: Negative for ear pain, congestion, sore throat, trouble swallowing, neck pain, voice change and sinus pressure.   Eyes: Negative for visual disturbance.  Respiratory: Negative for cough, shortness of breath, wheezing and stridor.   Cardiovascular: Negative for chest pain, palpitations and leg swelling.  Gastrointestinal: Negative for nausea, vomiting, abdominal pain, diarrhea, constipation, blood in stool, abdominal distention and anal bleeding.  Genitourinary: Negative for dysuria and flank pain.   Musculoskeletal: Negative for myalgias, arthralgias and gait problem.  Skin: Negative for color change and rash.  Neurological: Negative for dizziness and headaches.  Hematological: Negative for adenopathy. Does not bruise/bleed easily.  Psychiatric/Behavioral: Negative for suicidal ideas, sleep disturbance and dysphoric mood. The patient is not nervous/anxious.        Objective:   Physical Exam  Constitutional: She is oriented to person, place, and time. She appears well-developed and well-nourished. No distress.  HENT:  Head: Normocephalic and atraumatic.  Right Ear: External ear normal.  Left Ear: External ear normal.  Nose: Nose normal.  Mouth/Throat: Oropharynx is clear and moist. No oropharyngeal exudate.  Eyes: Conjunctivae are normal. Pupils are equal, round, and reactive to light. Right eye exhibits no discharge. Left eye exhibits no discharge. No scleral icterus.  Neck: Normal range of motion. Neck supple. No tracheal deviation present. No thyromegaly present.  Cardiovascular: Normal rate, regular rhythm, normal heart sounds and intact distal pulses.  Exam reveals no gallop and no friction rub.   No murmur heard. Pulmonary/Chest: Effort normal and breath sounds normal. No accessory muscle usage. Not tachypneic. No respiratory distress. She has no decreased breath sounds. She has no wheezes. She has no rhonchi. She has no rales. She exhibits no tenderness.  Musculoskeletal: Normal range of motion. She exhibits no edema and no tenderness.  Lymphadenopathy:    She has no cervical adenopathy.  Neurological: She is alert and oriented to person, place, and time. No cranial nerve deficit. She exhibits normal muscle tone. Coordination normal.  Skin: Skin is warm and dry. No  rash noted. She is not diaphoretic. No erythema. No pallor.  Psychiatric: She has a normal mood and affect. Her behavior is normal. Judgment and thought content normal.          Assessment & Plan:

## 2013-01-10 ENCOUNTER — Telehealth: Payer: Self-pay | Admitting: General Surgery

## 2013-01-10 ENCOUNTER — Encounter: Payer: Self-pay | Admitting: General Surgery

## 2013-01-10 NOTE — Telephone Encounter (Signed)
Discussed with pt regarding her symptom of dysphagia. She states it has not bothered any in last 1 week or so. Says it is much better. Will therefore cancel plan for endoscopy. Pt advised to call if she has recurrence of dysphagia or upper abd pain, n/v.  Advised again on importance of yearly mammogram and MD exam.

## 2013-01-10 NOTE — Progress Notes (Signed)
Patient ID: [name] [name], female   DOB: 1931/12/23, 79 y.o.   MRN: [id number] Pt's hospital records were reviewed from last YR. She had ischemic colitis and underwent colon resection. No documentation of endoscopy.  Also pt is due for her annual mammogram- it is scheduled for August. Will proceed with Upper endoscopy for her dysphagia.  Pt will be notified by phone.

## 2013-01-26 ENCOUNTER — Ambulatory Visit: Payer: Medicare Other | Admitting: Internal Medicine

## 2013-03-07 ENCOUNTER — Other Ambulatory Visit: Payer: Medicare Other

## 2013-03-09 ENCOUNTER — Ambulatory Visit: Payer: Medicare Other | Admitting: Internal Medicine

## 2013-03-15 ENCOUNTER — Other Ambulatory Visit: Payer: Medicare Other

## 2013-03-16 ENCOUNTER — Other Ambulatory Visit (INDEPENDENT_AMBULATORY_CARE_PROVIDER_SITE_OTHER): Payer: Medicare Other

## 2013-03-16 DIAGNOSIS — E1151 Type 2 diabetes mellitus with diabetic peripheral angiopathy without gangrene: Secondary | ICD-10-CM

## 2013-03-16 DIAGNOSIS — E1159 Type 2 diabetes mellitus with other circulatory complications: Secondary | ICD-10-CM

## 2013-03-16 DIAGNOSIS — I798 Other disorders of arteries, arterioles and capillaries in diseases classified elsewhere: Secondary | ICD-10-CM

## 2013-03-16 LAB — COMPREHENSIVE METABOLIC PANEL
ALT: 23 U/L (ref 0–35)
AST: 25 U/L (ref 0–37)
Creatinine, Ser: 1.2 mg/dL (ref 0.4–1.2)
GFR: 46.96 mL/min — ABNORMAL LOW (ref >60.00)
Sodium: 132 mEq/L — ABNORMAL LOW (ref 135–145)
Total Bilirubin: 0.7 mg/dL (ref 0.3–1.2)
Total Protein: 6.7 g/dL (ref 6.0–8.3)

## 2013-03-21 ENCOUNTER — Encounter: Payer: Self-pay | Admitting: Internal Medicine

## 2013-03-21 ENCOUNTER — Ambulatory Visit (INDEPENDENT_AMBULATORY_CARE_PROVIDER_SITE_OTHER): Payer: Medicare Other | Admitting: Internal Medicine

## 2013-03-21 ENCOUNTER — Ambulatory Visit: Payer: Self-pay | Admitting: Internal Medicine

## 2013-03-21 ENCOUNTER — Telehealth: Payer: Self-pay | Admitting: Internal Medicine

## 2013-03-21 VITALS — BP 106/58 | HR 93 | Temp 97.8°F | Resp 12 | Wt 164.0 lb

## 2013-03-21 DIAGNOSIS — R52 Pain, unspecified: Secondary | ICD-10-CM

## 2013-03-21 DIAGNOSIS — K5900 Constipation, unspecified: Secondary | ICD-10-CM

## 2013-03-21 DIAGNOSIS — E1151 Type 2 diabetes mellitus with diabetic peripheral angiopathy without gangrene: Secondary | ICD-10-CM

## 2013-03-21 DIAGNOSIS — I798 Other disorders of arteries, arterioles and capillaries in diseases classified elsewhere: Secondary | ICD-10-CM

## 2013-03-21 DIAGNOSIS — R1031 Right lower quadrant pain: Secondary | ICD-10-CM

## 2013-03-21 DIAGNOSIS — E1159 Type 2 diabetes mellitus with other circulatory complications: Secondary | ICD-10-CM

## 2013-03-21 MED ORDER — INSULIN GLARGINE 100 UNIT/ML ~~LOC~~ SOLN: 35.0000 [IU] | Freq: Every day | SUBCUTANEOUS | Status: DC

## 2013-03-21 MED ORDER — INSULIN ASPART 100 UNIT/ML ~~LOC~~ SOLN: 5.0000 [IU] | Freq: Three times a day (TID) | SUBCUTANEOUS | Status: DC

## 2013-03-21 NOTE — Telephone Encounter (Signed)
Called and advised patient of results

## 2013-03-21 NOTE — Patient Instructions (Addendum)
Increase the Lantus to 35units every morning.  Continue Novolog sliding scale. Please take the NovoLog as follows:  - <100: no NovoLog  - 100-150: 4 units  - 151-175: 5 units  - 176-200: 6 units  - 201-225: 7 units  - > 225: 8 units

## 2013-03-21 NOTE — Progress Notes (Signed)
Subjective:    Patient ID: [name] [name], female    DOB: 1932/07/23, 79 y.o.   MRN: [id number]  HPI 79 year old female with history of poorly controlled diabetes, coronary artery disease, hypothyroidism, mesenteric ischemia presents for followup. In regards to diabetes, blood sugars continue to be elevated. She did not bring record of blood sugars today. Recent A1c was 11%. She reports full compliance with her Lantus 30 units daily but is unsure about use of NovoLog. She reports that blood sugars have been consistently greater than 400.  She is also concerned today about constipation. She has tried using over-the-counter stool softeners and suppositories with no improvement. Over the last few days she has been having some right lower cautery an aching abdominal pain. She denies blood in her stool, nausea, vomiting, fever, chills. Her last bowel movement was this morning but was entirely liquid.  Outpatient Encounter Prescriptions as of 03/21/2013  Medication Sig Dispense Refill  . insulin aspart (NOVOLOG FLEXPEN) 100 UNIT/ML injection Inject 5 Units into the skin 3 (three) times daily before meals. ONLY if blood sugar greater than 300  1 vial  6  . insulin glargine (LANTUS) 100 UNIT/ML injection Inject 0.35 mLs (35 Units total) into the skin daily.  3 mL  11  . levothyroxine (SYNTHROID, LEVOTHROID) 88 MCG tablet Take 1 tablet (88 mcg total) by mouth daily.  90 tablet  3  . pantoprazole (PROTONIX) 40 MG tablet Take 1 tablet (40 mg total) by mouth 2 (two) times daily.  180 tablet  1   No facility-administered encounter medications on file as of 03/21/2013.   BP 106/58  Pulse 93  Temp(Src) 97.8 F (36.6 C) (Oral)  Resp 12  Wt 164 lb (74.39 kg)  BMI 27.29 kg/m2  SpO2 96%  Review of Systems  Constitutional: Negative for fever, chills, appetite change, fatigue and unexpected weight change.  HENT: Negative for ear pain, congestion, sore throat, trouble swallowing, neck pain, voice change and  sinus pressure.   Eyes: Negative for visual disturbance.  Respiratory: Negative for cough, shortness of breath, wheezing and stridor.   Cardiovascular: Negative for chest pain, palpitations and leg swelling.  Gastrointestinal: Positive for abdominal pain and constipation. Negative for nausea, vomiting, diarrhea, blood in stool, abdominal distention and anal bleeding.  Genitourinary: Negative for dysuria and flank pain.  Musculoskeletal: Negative for myalgias, arthralgias and gait problem.  Skin: Negative for color change and rash.  Neurological: Negative for dizziness and headaches.  Hematological: Negative for adenopathy. Does not bruise/bleed easily.  Psychiatric/Behavioral: Negative for suicidal ideas, sleep disturbance and dysphoric mood. The patient is not nervous/anxious.        Objective:   Physical Exam  Constitutional: She is oriented to person, place, and time. She appears well-developed and well-nourished. No distress.  HENT:  Head: Normocephalic and atraumatic.  Right Ear: External ear normal.  Left Ear: External ear normal.  Nose: Nose normal.  Mouth/Throat: Oropharynx is clear and moist. No oropharyngeal exudate.  Eyes: Conjunctivae are normal. Pupils are equal, round, and reactive to light. Right eye exhibits no discharge. Left eye exhibits no discharge. No scleral icterus.  Neck: Normal range of motion. Neck supple. No tracheal deviation present. No thyromegaly present.  Cardiovascular: Normal rate, regular rhythm, normal heart sounds and intact distal pulses.  Exam reveals no gallop and no friction rub.   No murmur heard. Pulmonary/Chest: Effort normal and breath sounds normal. No accessory muscle usage. Not tachypneic. No respiratory distress. She has no decreased breath sounds. She has  no wheezes. She has no rhonchi. She has no rales. She exhibits no tenderness.  Abdominal: Soft. Normal appearance and bowel sounds are normal. There is tenderness in the right lower  quadrant. There is tenderness at McBurney's point.  Musculoskeletal: Normal range of motion. She exhibits no edema and no tenderness.  Lymphadenopathy:    She has no cervical adenopathy.  Neurological: She is alert and oriented to person, place, and time. No cranial nerve deficit. She exhibits normal muscle tone. Coordination normal.  Skin: Skin is warm and dry. No rash noted. She is not diaphoretic. No erythema. No pallor.  Psychiatric: She has a normal mood and affect. Her behavior is normal. Judgment and thought content normal.          Assessment & Plan:

## 2013-03-21 NOTE — Telephone Encounter (Signed)
CT of the abdomen showed only mild constipation. No other abnormalities. Please have her continue stool softeners. If pain persists, then she should call us back and we will need to set up additional testing to look at blood flow to the bowel, with ultrasound.

## 2013-03-21 NOTE — Assessment & Plan Note (Signed)
Chronic constipation recently worsened and not improved with use of suppositories. Given history of partial colectomy and right lower quadrant pain and noted on exam symptoms are concerning for bowel obstruction. Will get CT of the abdomen for further evaluation.

## 2013-03-21 NOTE — Assessment & Plan Note (Signed)
Acute right lower quadrant abdominal pain, constipation in the setting of history of partial colectomy for mesenteric ischemia. Will get CT of the abdomen and pelvis for further evaluation.

## 2013-03-21 NOTE — Assessment & Plan Note (Signed)
Lab Results  Component Value Date   HGBA1C 11.0* 03/16/2013   Blood sugars continue to be very poorly controlled. Encouraged patient to follow through on diabetes education classes. Will increase Lantus to 35 units every morning. Will set up a followup with her endocrinologist.

## 2013-03-23 ENCOUNTER — Telehealth: Payer: Self-pay | Admitting: Internal Medicine

## 2013-03-23 DIAGNOSIS — E1151 Type 2 diabetes mellitus with diabetic peripheral angiopathy without gangrene: Secondary | ICD-10-CM

## 2013-03-23 MED ORDER — INSULIN GLARGINE 100 UNIT/ML ~~LOC~~ SOLN: 35.0000 [IU] | Freq: Every day | SUBCUTANEOUS | Status: DC

## 2013-03-23 MED ORDER — INSULIN ASPART 100 UNIT/ML ~~LOC~~ SOLN: 5.0000 [IU] | Freq: Three times a day (TID) | SUBCUTANEOUS | Status: DC

## 2013-03-23 NOTE — Telephone Encounter (Signed)
Pt is needing refill on Novolog and Lanis. Pt is completely out of both and I will check for samples. Pt uses [facility] in [place]

## 2013-03-23 NOTE — Telephone Encounter (Signed)
Patient walked in, was given samples and prescription sent to pharmacy on file.

## 2013-04-22 ENCOUNTER — Telehealth: Payer: Self-pay | Admitting: Internal Medicine

## 2013-04-22 NOTE — Telephone Encounter (Signed)
Let's have her decrease to 40units daily and monitor BG closely. Follow up next week

## 2013-04-22 NOTE — Telephone Encounter (Signed)
Fwd to Dr. Walker 

## 2013-04-22 NOTE — Telephone Encounter (Signed)
Was her dose of Lantus recently changed by her endocrinologist?

## 2013-04-22 NOTE — Telephone Encounter (Signed)
Pt states her blood sugar has been low every morning this week except for one.  Wants to know what she needs to do about this.  States it has been 61, 67, and this morning it was 70.

## 2013-04-22 NOTE — Telephone Encounter (Signed)
Pt notified to decrease to 40 units, monitor, & call back with an update next week.

## 2013-04-22 NOTE — Telephone Encounter (Signed)
Spoke with patient and she is taking 45 units of Lantus daily.

## 2013-04-27 NOTE — Telephone Encounter (Signed)
Thanks and noted 

## 2013-05-10 ENCOUNTER — Telehealth: Payer: Self-pay | Admitting: Internal Medicine

## 2013-05-10 NOTE — Telephone Encounter (Signed)
Did you ask patient if she has contacted her pharmacist?

## 2013-05-10 NOTE — Telephone Encounter (Signed)
Pt is calling she is needing some more Test strips for her meter. Pt uses [facility] in [place]. Pt is wanting more than 50 if that is possible because they run out so fast. Pt checks blood sugar 3 to 4 times daily she says.

## 2013-05-11 MED ORDER — GLUCOSE BLOOD VI STRP: ORAL_STRIP | Status: DC

## 2013-05-11 NOTE — Addendum Note (Signed)
Addended by: Theola Sequin on: 05/11/2013 04:27 PM   Modules accepted: Orders

## 2013-06-20 ENCOUNTER — Encounter: Payer: Self-pay | Admitting: Internal Medicine

## 2013-06-20 ENCOUNTER — Ambulatory Visit (INDEPENDENT_AMBULATORY_CARE_PROVIDER_SITE_OTHER): Payer: Medicare Other | Admitting: Internal Medicine

## 2013-06-20 ENCOUNTER — Encounter (INDEPENDENT_AMBULATORY_CARE_PROVIDER_SITE_OTHER): Payer: Self-pay

## 2013-06-20 VITALS — BP 106/60 | HR 88 | Temp 97.8°F | Resp 12 | Ht 65.0 in | Wt 165.0 lb

## 2013-06-20 DIAGNOSIS — E1151 Type 2 diabetes mellitus with diabetic peripheral angiopathy without gangrene: Secondary | ICD-10-CM

## 2013-06-20 DIAGNOSIS — E1159 Type 2 diabetes mellitus with other circulatory complications: Secondary | ICD-10-CM

## 2013-06-20 DIAGNOSIS — I798 Other disorders of arteries, arterioles and capillaries in diseases classified elsewhere: Secondary | ICD-10-CM

## 2013-06-20 LAB — COMPREHENSIVE METABOLIC PANEL
ALT: 20 U/L (ref 0–35)
AST: 20 U/L (ref 0–37)
Alkaline Phosphatase: 85 U/L (ref 39–117)
Potassium: 4.4 mEq/L (ref 3.5–5.1)
Sodium: 133 mEq/L — ABNORMAL LOW (ref 135–145)
Total Bilirubin: 0.6 mg/dL (ref 0.3–1.2)
Total Protein: 6.6 g/dL (ref 6.0–8.3)

## 2013-06-20 LAB — HEMOGLOBIN A1C: Hgb A1c MFr Bld: 11.3 % — ABNORMAL HIGH (ref 4.6–6.5)

## 2013-06-20 LAB — LIPID PANEL
Cholesterol: 286 mg/dL — ABNORMAL HIGH (ref 0–200)
HDL: 120.7 mg/dL (ref >39.00)
Triglycerides: 74 mg/dL (ref 0.0–149.0)
VLDL: 14.8 mg/dL (ref 0.0–40.0)

## 2013-06-20 LAB — MICROALBUMIN / CREATININE URINE RATIO: Microalb Creat Ratio: 2.2 mg/g (ref 0.0–30.0)

## 2013-06-20 NOTE — Progress Notes (Signed)
Pre visit review using our clinic review tool, if applicable. No additional management support is needed unless otherwise documented below in the visit note. 

## 2013-06-20 NOTE — Patient Instructions (Signed)
Before meals, check your blood sugars. If blood sugar is >250, please take 5units of Novolog insulin.  Recheck blood sugar in 1 hour. If blood sugar >250, then take another 5 units of Novolog. We will set up follow up with Dr. Elvera Lennox.

## 2013-06-20 NOTE — Progress Notes (Signed)
Subjective:    Patient ID: [name] [name], female    DOB: Jul 25, 1932, 79 y.o.   MRN: [id number]  HPI 79 year old female with history of diabetes, coronary artery disease, hypothyroidism presents for followup. She reports that her blood sugars have been very high. Her records show blood sugars typically near 300-500. She reports compliance with Lantus insulin 35 units daily. She has not been following a sliding scale for NovoLog insulin as prescribed by her endocrinologist. She appears confused about this. She has been taking about 4 units of NovoLog when blood sugars are greater than 250. She reports that this does not generally help improve blood sugar control. She is not compliant with diet. She has not started diabetes education classes.  Outpatient Encounter Prescriptions as of 06/20/2013  Medication Sig  . glucose blood (TRUETRACK TEST) test strip Use as directed 4 to 6 times daily Dx: 250.02  . insulin aspart (NOVOLOG FLEXPEN) 100 UNIT/ML injection Inject 5 Units into the skin 3 (three) times daily before meals. ONLY if blood sugar greater than 300  . insulin glargine (LANTUS) 100 UNIT/ML injection Inject 0.35 mLs (35 Units total) into the skin daily.  Marland Kitchen levothyroxine (SYNTHROID, LEVOTHROID) 88 MCG tablet Take 1 tablet (88 mcg total) by mouth daily.  . pantoprazole (PROTONIX) 40 MG tablet Take 1 tablet (40 mg total) by mouth 2 (two) times daily.   BP 106/60  Pulse 88  Temp(Src) 97.8 F (36.6 C) (Oral)  Resp 12  Ht 5\' 5"  (1.651 m)  Wt 165 lb (74.844 kg)  BMI 27.46 kg/m2  SpO2 94%  Review of Systems  Constitutional: Negative for fever, chills, appetite change, fatigue and unexpected weight change.  HENT: Negative for congestion, ear pain, sinus pressure, sore throat, trouble swallowing and voice change.   Eyes: Negative for visual disturbance.  Respiratory: Negative for cough, shortness of breath, wheezing and stridor.   Cardiovascular: Negative for chest pain, palpitations and leg  swelling.  Gastrointestinal: Negative for nausea, vomiting, abdominal pain, diarrhea, constipation, blood in stool, abdominal distention and anal bleeding.  Genitourinary: Negative for dysuria and flank pain.  Musculoskeletal: Negative for arthralgias, gait problem, myalgias and neck pain.  Skin: Negative for color change and rash.  Neurological: Negative for dizziness and headaches.  Hematological: Negative for adenopathy. Does not bruise/bleed easily.  Psychiatric/Behavioral: Negative for suicidal ideas, sleep disturbance and dysphoric mood. The patient is not nervous/anxious.        Objective:   Physical Exam  Constitutional: She is oriented to person, place, and time. She appears well-developed and well-nourished. No distress.  HENT:  Head: Normocephalic and atraumatic.  Right Ear: External ear normal.  Left Ear: External ear normal.  Nose: Nose normal.  Mouth/Throat: Oropharynx is clear and moist. No oropharyngeal exudate.  Eyes: Conjunctivae are normal. Pupils are equal, round, and reactive to light. Right eye exhibits no discharge. Left eye exhibits no discharge. No scleral icterus.  Neck: Normal range of motion. Neck supple. No tracheal deviation present. No thyromegaly present.  Cardiovascular: Normal rate, regular rhythm, normal heart sounds and intact distal pulses.  Exam reveals no gallop and no friction rub.   No murmur heard. Pulmonary/Chest: Effort normal and breath sounds normal. No accessory muscle usage. Not tachypneic. No respiratory distress. She has no decreased breath sounds. She has no wheezes. She has no rhonchi. She has no rales. She exhibits no tenderness.  Musculoskeletal: Normal range of motion. She exhibits no edema and no tenderness.  Lymphadenopathy:    She has no cervical  adenopathy.  Neurological: She is alert and oriented to person, place, and time. No cranial nerve deficit. She exhibits normal muscle tone. Coordination normal.  Skin: Skin is warm and  dry. No rash noted. She is not diaphoretic. No erythema. No pallor.  Psychiatric: She has a normal mood and affect. Her behavior is normal. Judgment and thought content normal.          Assessment & Plan:

## 2013-06-20 NOTE — Assessment & Plan Note (Addendum)
Blood sugars continue to be very poorly controlled. Reviewed recent notes from patient's endocrinologist which recommended sliding scale insulin. Patient has not been compliant with this and appears to be confused about the sliding scale. Will have her take NovoLog 5 units for blood sugar greater than 250 prior to a meal and then recheck blood sugar and one-hour. If blood sugar continues to be greater than 250 we'll have her repeat NovoLog 5units. Continue Lantus 35 units daily. Will schedule followup with her endocrinologist. Encouraged her to follow through with the diabetes education classes we scheduled. Over of which >50% spent in face-to-face contact with patient discussing plan of care

## 2013-06-29 ENCOUNTER — Encounter: Payer: Self-pay | Admitting: Internal Medicine

## 2013-06-29 ENCOUNTER — Ambulatory Visit (INDEPENDENT_AMBULATORY_CARE_PROVIDER_SITE_OTHER): Payer: Medicare Other | Admitting: Internal Medicine

## 2013-06-29 VITALS — BP 112/64 | HR 89 | Temp 97.8°F | Resp 10 | Wt 166.8 lb

## 2013-06-29 DIAGNOSIS — E1159 Type 2 diabetes mellitus with other circulatory complications: Secondary | ICD-10-CM

## 2013-06-29 DIAGNOSIS — E1151 Type 2 diabetes mellitus with diabetic peripheral angiopathy without gangrene: Secondary | ICD-10-CM

## 2013-06-29 DIAGNOSIS — I798 Other disorders of arteries, arterioles and capillaries in diseases classified elsewhere: Secondary | ICD-10-CM

## 2013-06-29 NOTE — Patient Instructions (Signed)
Please decrease Lantus to 30 units every morning.  Please take the NovoLog as follows: 7 units with a smaller meal and 10 units with a larger meal. Please return in 1 month with your sugar log.

## 2013-06-29 NOTE — Progress Notes (Signed)
Patient ID: [name] [name], female   DOB: December 31, 1931, 79 y.o.   MRN: [id number]  HPI: [name] [name] is a 79 y.o.-year-old female, returning for f/u for DM2, dx 2009, insulin-dependent since 2011, uncontrolled, with complications (CAD - s/p MI, PVD, stage 3 CKD, peripheral neuropathy). She did not return for f/u as advised. Last visit was 6 mo ago.  Last hemoglobin A1c was: Lab Results  Component Value Date   HGBA1C 11.3* 06/20/2013   HGBA1C 11.0* 03/16/2013   HGBA1C 12.1* 12/02/2012   Pt is on a regimen of: - Lantus 40 units  - Novolog 4-5 units tid ac only if sugars >250 At last visit, I advised her to use the following mealtime + SSI: - <100: no NovoLog - 100-150: 4 units - 151-175: 5 units - 176-200: 6 units  - 201-225: 7 units  - > 225: 8 units  If eat a smaller meal, use less Novolog by 2 units.  She, however, did not understand how she should use the Novolog. She did not call us for clarification but was seen by PCP who noted she had high sugars and she advised her to restart NovoLog at the previous regime, which she appeared to know how to use. She was also advised to increase back the Lantus to 35 (we decreased it from 40 to 30 units at last visit 2/2 lows)  Pt checks her sugars 2-4 a day and they are widely fluctuating: - am: 70-212 (at 70, 2-3x a month) >> 88-454 - 1h after breakfast: 200s >> 2h after b'fast: 92-146 - before lunch: 250-300 >> 325-596 - 2h after lunch: 222-406 - before dinner:300s-400s >> 276-HI - after dinner: 400s-500s >> 297-HI  At last visit, she had many lows in her log, LO-70s (only in am) >> I advised her to reduce the Lantus by 10 units, and she did this but it was increased back by PCP 2/2 persistent highs. Lowest sugar was 88 x1 ; she has hypoglycemia awareness at 70. Highest sugar was HI.                                                                                                                                                                                             Pt's meals are: - Breakfast: egg + [place] bacon, toast, sometimes cheese - Lunch: [place] sandwich on whole wheat bread, a little mayonaise - Dinner: salad + [employer], hamburger steak + salad, etc. - Snacks: none  Pt does has mild chronic kidney disease, last BUN/creatinine was:  Lab Results  Component Value Date   BUN 22 06/20/2013   CREATININE 1.1 06/20/2013  Last set of lipids: Lab Results  Component Value Date   CHOL 286* 06/20/2013   HDL 120.70 06/20/2013   LDLDIRECT 138.1 06/20/2013   TRIG 74.0 06/20/2013   CHOLHDL 2 06/20/2013   Pt's last eye exam was in 04/2013:  macular degeneration and background DR OD. Has blurry vision and cannot drive at night. She has numbness and tingling in her toes and side of feet.  She also has a history of GERD, hypothyroidism-on levothyroxine 88, history of mesenteric ischemia, history of CIS of the breast - status post right mastectomy, history of bowel obstruction - status post colectomy 05/2012, hyperlipidemia, anxiety, osteoporosis, history of noncompliance with meds.  ROS: Constitutional: no weight gain/loss, no fatigue, no subjective hyperthermia/hypothermia Eyes: + blurry vision, no xerophthalmia ENT: no sore throat, no nodules palpated in throat, no dysphagia/odynophagia, no hoarseness Cardiovascular: no CP/SOB/palpitations/leg swelling Respiratory: no cough/SOB Gastrointestinal: no N/V/D/C Musculoskeletal: no muscle/joint aches Skin: no rashes Neurological: no tremors/numbness/tingling/+ dizziness- in am, when sugars low  I reviewed pt's medications, allergies, PMH, social hx, family hx and no changes required, except as mentioned above.  PE: BP 112/64  Pulse 89  Temp(Src) 97.8 F (36.6 C) (Oral)  Resp 10  Wt 166 lb 12.8 oz (75.66 kg)  SpO2 96% Wt Readings from Last 3 Encounters:  06/29/13 166 lb 12.8 oz (75.66 kg)  06/20/13 165 lb (74.844 kg)  03/21/13 164 lb (74.39 kg)   Constitutional:  normal weight, in NAD Eyes: PERRLA, EOMI, no exophthalmos ENT: moist mucous membranes, no thyromegaly, no cervical lymphadenopathy Cardiovascular: RRR, No MRG Respiratory: CTA B Gastrointestinal: abdomen soft, NT, ND, BS+ Musculoskeletal: no deformities, strength intact in all 4 Skin: moist, warm, no rashes Neurological: mild tremor with outstretched hands  ASSESSMENT: 1. DM2, insulin-dependent, uncontrolled, with complications - CAD - s/p MI  - PVD - stage 3 CKD - peripheral neuropathy  PLAN:  1. Pt with uncontrolled diabetes, on basal-bolus insulin regimen, which is suboptimum. She did not return for f/u, was confused about the regimen that I gave her at last visit, and ended up on the same regimen as 6 months ago, which is not working well for her: wide CBG fluctuations in am (usually a sign of excessive basal insulin) and staircase pattern of CBG increase throughout the day (usually a sign of not enough meal coverage).   - Will continue Novolog insulin for all of her meals, but will not add a SSI so we can simplify the regimen. I will also decrease her Lantus to avoid the lows in am.  - I gave her the following instructions: Patient Instructions  Please decrease Lantus to 30 units every morning.  Please take the NovoLog as follows: 7 units with a smaller meal and 10 units with a larger meal. Please return in 1 month with your sugar log. . - she appears to have understood the instructions, advised her to call with any Qs or if sugars stay high or drop low - I will see her back in 1 month with her sugar log - continue to check 2-4 x a day

## 2013-08-03 ENCOUNTER — Ambulatory Visit: Payer: Medicare Other | Admitting: Internal Medicine

## 2013-08-03 DIAGNOSIS — Z0289 Encounter for other administrative examinations: Secondary | ICD-10-CM

## 2013-08-24 ENCOUNTER — Other Ambulatory Visit: Payer: Self-pay | Admitting: Internal Medicine

## 2013-09-07 ENCOUNTER — Ambulatory Visit (INDEPENDENT_AMBULATORY_CARE_PROVIDER_SITE_OTHER): Payer: Commercial Managed Care - HMO | Admitting: Internal Medicine

## 2013-09-07 ENCOUNTER — Encounter: Payer: Self-pay | Admitting: Internal Medicine

## 2013-09-07 VITALS — BP 116/68 | HR 94 | Temp 97.7°F | Wt 167.5 lb

## 2013-09-07 DIAGNOSIS — I2510 Atherosclerotic heart disease of native coronary artery without angina pectoris: Secondary | ICD-10-CM

## 2013-09-07 NOTE — Patient Instructions (Signed)
Please increase Lantus to 35 units every morning.  Please take the NovoLog before every meal as follows:  8 units with a smaller meal  12 units with a larger meal  Please return on 03/18.

## 2013-09-07 NOTE — Progress Notes (Signed)
Pre-visit discussion using our clinic review tool. No additional management support is needed unless otherwise documented below in the visit note.  

## 2013-09-07 NOTE — Progress Notes (Signed)
Patient ID: [name] [name], female   DOB: 1932/06/07, 80 y.o.   MRN: [id number]  HPI: [name] [name] is a 80 y.o.-year-old female, returning for f/u for DM2, dx 2009, insulin-dependent since 2011, uncontrolled, with complications (CAD - s/p MI, PVD, stage 3 CKD, peripheral neuropathy). Last visit 2 mo ago.  Last hemoglobin A1c was: Lab Results  Component Value Date   HGBA1C 11.3* 06/20/2013   HGBA1C 11.0* 03/16/2013   HGBA1C 12.1* 12/02/2012   Pt was on a regimen of: - Lantus 40 units  - Novolog 4-5 units tid ac only if sugars >250 At last visit, I advised her to use the following mealtime + SSI: - <100: no NovoLog - 100-150: 4 units - 151-175: 5 units - 176-200: 6 units  - 201-225: 7 units  - > 225: 8 units  If eat a smaller meal, use less Novolog by 2 units.  She, however, did not understand how she should use the Novolog. She did not call us for clarification but was seen by PCP who noted she had high sugars and she advised her to restart NovoLog at the previous regime, which she appeared to know how to use.  We changed to a simpler regimen at last visit: - Lantus 30 units in am  - NovoLog as follows: 7 units with a smaller meal and 10 units with a larger meal >> she ends up injecting 8-12 units - only when sugars are >300.  Pt checks her sugars 2-4 a day and they are widely fluctuating (no log) - tells me the sugars are 139-HI >> reviewed values from last time: - am: 70-212 (at 70, 2-3x a month) >> 88-454 - 1h after breakfast: 200s >> 2h after b'fast: 92-146 - before lunch: 250-300 >> 325-596 - 2h after lunch: 222-406 - before dinner:300s-400s >> 276-HI - after dinner: 400s-500s >> 297-HI  In the past, since she had many lows in her log, LO-70s (only in am) >> we decreased Lantus; she has hypoglycemia awareness at 70. Highest sugar was HI.                                                                                                                                                                                             Pt's meals are: - Breakfast: egg + [place] bacon, toast, sometimes cheese - Lunch: [place] sandwich on whole wheat bread, a little mayonaise - Dinner: salad + [employer], hamburger steak + salad, etc. - Snacks: none  Pt does has mild chronic kidney disease, last BUN/creatinine was:  Lab Results  Component Value Date   BUN 22 06/20/2013   CREATININE  1.1 06/20/2013   Last set of lipids: Lab Results  Component Value Date   CHOL 286* 06/20/2013   HDL 120.70 06/20/2013   LDLDIRECT 138.1 06/20/2013   TRIG 74.0 06/20/2013   CHOLHDL 2 06/20/2013   Pt's last eye exam was in 04/2013:  macular degeneration and background DR OD. Has blurry vision and cannot drive at night. She has numbness and tingling in her toes and side of feet.  She also has a history of GERD, hypothyroidism-on levothyroxine 88, history of mesenteric ischemia, history of CIS of the breast - status post right mastectomy, history of bowel obstruction - status post colectomy 05/2012, hyperlipidemia, anxiety, osteoporosis, history of noncompliance with meds.  ROS: Constitutional: no weight gain/loss, + fatigue, no subjective hyperthermia/hypothermia Eyes: + blurry vision, no xerophthalmia ENT: no sore throat, no nodules palpated in throat, no dysphagia/odynophagia, no hoarseness, + decreased hearing Cardiovascular: no CP/SOB/palpitations/leg swelling Respiratory: no cough/SOB Gastrointestinal: no N/V/D/C Musculoskeletal: no muscle/joint aches Skin: no rashes Neurological: no tremors/numbness/tingling  I reviewed pt's medications, allergies, PMH, social hx, family hx and no changes required, except as mentioned above.  PE: BP 116/68  Pulse 94  Temp(Src) 97.7 F (36.5 C) (Oral)  Wt 167 lb 8 oz (75.978 kg)  SpO2 95% Wt Readings from Last 3 Encounters:  09/07/13 167 lb 8 oz (75.978 kg)  06/29/13 166 lb 12.8 oz (75.66 kg)  06/20/13 165 lb (74.844 kg)   Constitutional:  normal weight, in NAD Eyes: PERRLA, EOMI, no exophthalmos ENT: moist mucous membranes, no thyromegaly, no cervical lymphadenopathy Cardiovascular: RRR, No MRG Respiratory: CTA B Gastrointestinal: abdomen soft, NT, ND, BS+ Musculoskeletal: no deformities, strength intact in all 4 Skin: moist, warm, no rashes  ASSESSMENT: 1. DM2, insulin-dependent, uncontrolled, with complications - CAD - s/p MI  - PVD - stage 3 CKD - peripheral neuropathy  PLAN:  1. Pt with uncontrolled diabetes, on basal-bolus insulin regimen, with poor control. We simplified the regimen at last visit, but she is now using the mealtiem insulin prn, only if sugars before a meal >300. She does not bring her log... - Will continue Novolog insulin - strongly advised her to take it before all of her meals - I gave her the following instructions: Patient Instructions  Please increase Lantus to 35 units every morning.  Please take the NovoLog before every meal as follows: 8 units with a smaller meal and 12 units with a larger meal. Please return in 1 month with your sugar log. - advised her to call with any Qs or if sugars stay high or drop low - I will see her back in 1 month with her sugar log - continue to check 2-4 x a day - bring log at next visit

## 2013-09-14 ENCOUNTER — Telehealth: Payer: Self-pay | Admitting: *Deleted

## 2013-09-27 ENCOUNTER — Encounter: Payer: Self-pay | Admitting: Internal Medicine

## 2013-09-27 ENCOUNTER — Ambulatory Visit (INDEPENDENT_AMBULATORY_CARE_PROVIDER_SITE_OTHER): Payer: Commercial Managed Care - HMO | Admitting: Internal Medicine

## 2013-09-27 VITALS — BP 108/60 | HR 86 | Temp 97.9°F | Wt 167.0 lb

## 2013-09-27 DIAGNOSIS — E1159 Type 2 diabetes mellitus with other circulatory complications: Secondary | ICD-10-CM

## 2013-09-27 DIAGNOSIS — IMO0002 Reserved for concepts with insufficient information to code with codable children: Secondary | ICD-10-CM

## 2013-09-27 DIAGNOSIS — E785 Hyperlipidemia, unspecified: Secondary | ICD-10-CM

## 2013-09-27 DIAGNOSIS — E1165 Type 2 diabetes mellitus with hyperglycemia: Principal | ICD-10-CM

## 2013-09-27 DIAGNOSIS — E1151 Type 2 diabetes mellitus with diabetic peripheral angiopathy without gangrene: Secondary | ICD-10-CM

## 2013-09-27 LAB — COMPREHENSIVE METABOLIC PANEL
ALBUMIN: 3.4 g/dL — AB (ref 3.5–5.2)
ALT: 21 U/L (ref 0–35)
AST: 23 U/L (ref 0–37)
Alkaline Phosphatase: 83 U/L (ref 39–117)
BUN: 20 mg/dL (ref 6–23)
CALCIUM: 8.9 mg/dL (ref 8.4–10.5)
CHLORIDE: 101 meq/L (ref 96–112)
CO2: 25 mEq/L (ref 19–32)
Creatinine, Ser: 1.1 mg/dL (ref 0.4–1.2)
GFR: 53.08 mL/min — ABNORMAL LOW (ref >60.00)
Glucose, Bld: 232 mg/dL — ABNORMAL HIGH (ref 70–99)
Potassium: 3.8 mEq/L (ref 3.5–5.1)
SODIUM: 132 meq/L — AB (ref 135–145)
Total Bilirubin: 0.7 mg/dL (ref 0.3–1.2)
Total Protein: 7.1 g/dL (ref 6.0–8.3)

## 2013-09-27 LAB — LIPID PANEL
CHOL/HDL RATIO: 2
CHOLESTEROL: 300 mg/dL — AB (ref 0–200)
HDL: 120.4 mg/dL (ref >39.00)
LDL CALC: 166 mg/dL — AB (ref 0–99)
Triglycerides: 68 mg/dL (ref 0.0–149.0)
VLDL: 13.6 mg/dL (ref 0.0–40.0)

## 2013-09-27 LAB — HEMOGLOBIN A1C: Hgb A1c MFr Bld: 11.2 % — ABNORMAL HIGH (ref 4.6–6.5)

## 2013-09-27 LAB — TSH: TSH: 5.45 u[IU]/mL (ref 0.35–5.50)

## 2013-09-27 LAB — MICROALBUMIN / CREATININE URINE RATIO
Creatinine,U: 97.8 mg/dL
MICROALB UR: 1.7 mg/dL (ref 0.0–1.9)
MICROALB/CREAT RATIO: 1.7 mg/g (ref 0.0–30.0)

## 2013-09-27 MED ORDER — INSULIN GLARGINE 100 UNIT/ML ~~LOC~~ SOLN: 35.0000 [IU] | Freq: Every day | SUBCUTANEOUS | Status: DC

## 2013-09-27 MED ORDER — INSULIN ASPART 100 UNIT/ML ~~LOC~~ SOLN: SUBCUTANEOUS | Status: DC

## 2013-09-27 NOTE — Assessment & Plan Note (Signed)
Will check lipids with labs today. Pt intolerant to statins.

## 2013-09-27 NOTE — Assessment & Plan Note (Addendum)
BG continue to be poorly controlled. Pt did not follow advice of Dr. Cruzita Lederer as directed at last visit. Encouraged her to increase Lantus to 35units daily and use Novolog 8units with small meal and 12 units with large meal. Will check A1c with labs today. Follow up with Dr. Cruzita Lederer 3/18.  Over 31min of which >50% spent in face-to-face contact with patient discussing plan of care

## 2013-09-27 NOTE — Patient Instructions (Addendum)
We will check labs today. Keep a food diary for the next week.  Please increase Lantus to 35 units every morning.   Please take the NovoLog before every meal as follows: 8 units with a smaller meal and 12 units with a larger meal.  Follow up with Dr. Cruzita Lederer as scheduled.  Call immediately if any blood sugars less than 70.  Follow up here in 3 months and prn.

## 2013-09-27 NOTE — Progress Notes (Signed)
Subjective:    Patient ID: [name] [name], female    DOB: 02-16-1932, 80 y.o.   MRN: [id number]  HPI 80YO female with DM presents for follow up.  DM - Recently seen by Dr. Cruzita Lederer, but did not make changes to insulin regimen as directed. Continues to take Lantus 30units daily. BG mostly >300. Fasting BG occasionally near 100-150. Frequent post-prandial BG >500.  Review of Systems  Constitutional: Negative for fever, chills, appetite change, fatigue and unexpected weight change.  HENT: Negative for congestion, ear pain, sinus pressure, sore throat, trouble swallowing and voice change.   Eyes: Negative for visual disturbance.  Respiratory: Negative for cough, shortness of breath, wheezing and stridor.   Cardiovascular: Negative for chest pain, palpitations and leg swelling.  Gastrointestinal: Negative for nausea, vomiting, abdominal pain, diarrhea, constipation, blood in stool, abdominal distention and anal bleeding.  Genitourinary: Negative for dysuria and flank pain.  Musculoskeletal: Negative for arthralgias, gait problem, myalgias and neck pain.  Skin: Negative for color change and rash.  Neurological: Negative for dizziness and headaches.  Hematological: Negative for adenopathy. Does not bruise/bleed easily.  Psychiatric/Behavioral: Negative for suicidal ideas, sleep disturbance and dysphoric mood. The patient is not nervous/anxious.        Objective:    BP 108/60  Pulse 86  Temp(Src) 97.9 F (36.6 C) (Oral)  Wt 167 lb (75.751 kg)  SpO2 97% Physical Exam  Constitutional: She is oriented to person, place, and time. She appears well-developed and well-nourished. No distress.  HENT:  Head: Normocephalic and atraumatic.  Right Ear: External ear normal.  Left Ear: External ear normal.  Nose: Nose normal.  Mouth/Throat: Oropharynx is clear and moist. No oropharyngeal exudate.  Eyes: Conjunctivae are normal. Pupils are equal, round, and reactive to light. Right eye exhibits no  discharge. Left eye exhibits no discharge. No scleral icterus.  Neck: Normal range of motion. Neck supple. No tracheal deviation present. No thyromegaly present.  Cardiovascular: Normal rate, regular rhythm, normal heart sounds and intact distal pulses.  Exam reveals no gallop and no friction rub.   No murmur heard. Pulmonary/Chest: Effort normal and breath sounds normal. No accessory muscle usage. Not tachypneic. No respiratory distress. She has no decreased breath sounds. She has no wheezes. She has no rhonchi. She has no rales. She exhibits no tenderness.  Musculoskeletal: Normal range of motion. She exhibits no edema and no tenderness.  Lymphadenopathy:    She has no cervical adenopathy.  Neurological: She is alert and oriented to person, place, and time. No cranial nerve deficit. She exhibits normal muscle tone. Coordination normal.  Skin: Skin is warm and dry. No rash noted. She is not diaphoretic. No erythema. No pallor.  Psychiatric: She has a normal mood and affect. Her behavior is normal. Judgment and thought content normal.          Assessment & Plan:   Problem List Items Addressed This Visit   DM (diabetes mellitus) type II uncontrolled, periph vascular disorder - Primary     BG continue to be poorly controlled. Pt did not follow advice of Dr. Cruzita Lederer as directed at last visit. Encouraged her to increase Lantus to 35units daily and use Novolog 8units with small meal and 12 units with large meal. Will check A1c with labs today. Follow up with Dr. Cruzita Lederer 3/18.    Relevant Medications      insulin glargine (LANTUS) 100 UNIT/ML injection      insulin aspart (NOVOLOG) 100 UNIT/ML injection   Other Relevant Orders  TSH      Comprehensive metabolic panel      Hemoglobin A1c      Lipid panel      Microalbumin / creatinine urine ratio   Hyperlipidemia LDL goal < 70     Will check lipids with labs today. Pt intolerant to statins.        Return in about 3 months (around  12/28/2013) for Recheck of Diabetes.

## 2013-09-27 NOTE — Progress Notes (Signed)
Pre visit review using our clinic review tool, if applicable. No additional management support is needed unless otherwise documented below in the visit note. 

## 2013-09-28 ENCOUNTER — Encounter: Payer: Self-pay | Admitting: *Deleted

## 2013-09-29 ENCOUNTER — Telehealth: Payer: Self-pay

## 2013-09-29 NOTE — Telephone Encounter (Signed)
Relevant patient education mailed to patient.  

## 2013-09-30 NOTE — Telephone Encounter (Signed)
error 

## 2013-10-04 ENCOUNTER — Telehealth: Payer: Self-pay | Admitting: Internal Medicine

## 2013-10-04 MED ORDER — INSULIN GLARGINE 100 UNIT/ML SOLOSTAR PEN: 35.0000 [IU] | PEN_INJECTOR | Freq: Every day | SUBCUTANEOUS | Status: DC

## 2013-10-04 NOTE — Telephone Encounter (Signed)
Pt left vm.  Pt states "Please do not leave insulin out anymore.  I cannot get it like that and I cannot use it like that.  I need it in the pen.  Please do that for me."

## 2013-10-04 NOTE — Telephone Encounter (Signed)
Line was busy

## 2013-10-04 NOTE — Telephone Encounter (Signed)
No answer or voicemail to leave message 

## 2013-10-04 NOTE — Telephone Encounter (Signed)
Called and spoke with patient, she stated her Lantus prescription was sent in as vial but she uses the pen. This has happened on multiple occassions, please do not do that again. New prescription sent to pharmacy.

## 2013-10-07 ENCOUNTER — Telehealth: Payer: Self-pay | Admitting: Internal Medicine

## 2013-10-07 MED ORDER — INSULIN ASPART 100 UNIT/ML FLEXPEN: PEN_INJECTOR | SUBCUTANEOUS | Status: DC

## 2013-10-07 NOTE — Telephone Encounter (Signed)
States new script was received for Lantus Pens.  Still needing Novolog Pens are still needed.  Please correct.

## 2013-10-07 NOTE — Telephone Encounter (Signed)
When I spoke with patient earlier this week she did not mention refill or correction on Novolog. However I have faxed prescription to pharmacy on file per patient request.

## 2013-10-12 ENCOUNTER — Encounter: Payer: Self-pay | Admitting: *Deleted

## 2013-10-12 ENCOUNTER — Ambulatory Visit: Payer: Medicare Other | Admitting: Internal Medicine

## 2013-11-02 ENCOUNTER — Encounter: Payer: Self-pay | Admitting: Internal Medicine

## 2013-11-02 ENCOUNTER — Ambulatory Visit (INDEPENDENT_AMBULATORY_CARE_PROVIDER_SITE_OTHER): Payer: Commercial Managed Care - HMO | Admitting: Internal Medicine

## 2013-11-02 VITALS — BP 108/64 | HR 92 | Temp 97.8°F | Wt 166.8 lb

## 2013-11-02 DIAGNOSIS — E1159 Type 2 diabetes mellitus with other circulatory complications: Secondary | ICD-10-CM

## 2013-11-02 DIAGNOSIS — E1165 Type 2 diabetes mellitus with hyperglycemia: Principal | ICD-10-CM

## 2013-11-02 DIAGNOSIS — E1151 Type 2 diabetes mellitus with diabetic peripheral angiopathy without gangrene: Secondary | ICD-10-CM

## 2013-11-02 DIAGNOSIS — IMO0002 Reserved for concepts with insufficient information to code with codable children: Secondary | ICD-10-CM

## 2013-11-02 NOTE — Progress Notes (Signed)
Pre visit review using our clinic review tool, if applicable. No additional management support is needed unless otherwise documented below in the visit note. 

## 2013-11-02 NOTE — Progress Notes (Signed)
Patient ID: [name] [name], female   DOB: 16-Jun-1932, 80 y.o.   MRN: [id number]  HPI: [name] [name] is a 80 y.o.-year-old female, returning for f/u for DM2, dx 2009, insulin-dependent since 2011, uncontrolled, with complications (CAD - s/p MI, PVD, stage 3 CKD, peripheral neuropathy). Last visit 2 mo ago.  Last hemoglobin A1c was: Lab Results  Component Value Date   HGBA1C 11.2* 09/27/2013   HGBA1C 11.3* 06/20/2013   HGBA1C 11.0* 03/16/2013   Pt was on a regimen of: - Lantus 40 units  - Novolog 4-5 units tid ac only if sugars >250  At last visit, I advised her to use the following mealtime + SSI: - <100: no NovoLog - 100-150: 4 units - 151-175: 5 units - 176-200: 6 units  - 201-225: 7 units  - > 225: 8 units  If eat a smaller meal, use less Novolog by 2 units.  She, however, did not understand how she should use the Novolog. She did not call us for clarification but was seen by PCP who noted she had high sugars and she advised her to restart NovoLog at the previous regime, which she appeared to know how to use.  We changed to a simpler regimen at last visit: - Lantus 30 units in am >> 35 units at last visit - NovoLog as follows: 8 units with a smaller meal and 10-11 units with a larger meal, but only if sugars >300  Despite repeated discussions regarding taking the mealtime insulin regardless of the premeal sugars!!!  Pt checks her sugars 2-4 a day and they are widely fluctuating (reviewed her log): - am: 70-212 (at 70, 2-3x a month) >> 88-454 >> 86-419 - 1h after breakfast: 200s >> 2h after b'fast: 92-146 >> 464 - before lunch: 250-300 >> 325-596 >>415, 430 - 2h after lunch: 222-406 >> 363, 416, 519 - before dinner:300s-400s >> 276-HI >>409, 532 - after dinner: 400s-500s >> 297-HI >> 118 x1, 338-HI She has hypoglycemia awareness at 70. Highest sugar was HI.                                                                                                                                                                                             Pt's meals are: - Breakfast: egg + [place] bacon, toast, sometimes cheese - Lunch: [place] sandwich on whole wheat bread, a little mayonaise - Dinner: salad + [employer], hamburger steak + salad, etc. - Snacks: none  Pt does has mild chronic kidney disease, last BUN/creatinine was:  Lab Results  Component Value Date   BUN 20 09/27/2013   CREATININE 1.1 09/27/2013  Last set of lipids: Lab Results  Component Value Date   CHOL 300* 09/27/2013   HDL 120.40 09/27/2013   LDLCALC 166* 09/27/2013   LDLDIRECT 138.1 06/20/2013   TRIG 68.0 09/27/2013   CHOLHDL 2 09/27/2013   Pt's last eye exam was in 04/2013:  macular degeneration and background DR OD. Has blurry vision and cannot drive at night. She has numbness and tingling in her toes and side of feet.  She also has a history of GERD, hypothyroidism-on levothyroxine 88, history of mesenteric ischemia, history of CIS of the breast - status post right mastectomy, history of bowel obstruction - status post colectomy 05/2012, hyperlipidemia, anxiety, osteoporosis, history of noncompliance with meds.  ROS: Constitutional: no weight gain/loss, + fatigue, no subjective hyperthermia/hypothermia Eyes: + blurry vision, no xerophthalmia ENT: no sore throat, no nodules palpated in throat, no dysphagia/odynophagia, no hoarseness, + decreased hearing Cardiovascular: no CP/SOB/palpitations/leg swelling Respiratory: no cough/SOB Gastrointestinal: no N/V/D/C Musculoskeletal: no muscle/joint aches Skin: no rashes Neurological: no tremors/numbness/tingling  I reviewed pt's medications, allergies, PMH, social hx, family hx and no changes required, except as mentioned above.  PE: BP 108/64  Pulse 92  Temp(Src) 97.8 F (36.6 C) (Oral)  Wt 166 lb 12 oz (75.637 kg) Wt Readings from Last 3 Encounters:  11/02/13 166 lb 12 oz (75.637 kg)  09/27/13 167 lb (75.751 kg)  09/07/13 167 lb 8 oz (75.978 kg)    Constitutional: normal weight, in NAD Eyes: PERRLA, EOMI, no exophthalmos ENT: moist mucous membranes, no thyromegaly, no cervical lymphadenopathy Cardiovascular: RRR, No MRG Respiratory: CTA B Gastrointestinal: abdomen soft, NT, ND, BS+ Musculoskeletal: no deformities, strength intact in all 4 Skin: moist, warm, no rashes  ASSESSMENT: 1. DM2, insulin-dependent, uncontrolled, with complications - CAD - s/p MI  - PVD - stage 3 CKD - peripheral neuropathy  PLAN:  1. Pt with uncontrolled diabetes, on basal-bolus insulin regimen, with poor control. We simplified the regimen at last visit, but she is still not taking mealtime insulin unless sugars >300, despite lengthy discussions at every visit.  - I gave her the following instructions: Patient Instructions   Please keep Lantus at 35 units every morning.  Please take the NovoLog EVERY TIME YOU EAT a main meal, NOT ONLY if sugars are higher than 300:   Insulin Before breakfast Before lunch Before dinner  NovoLog  8 units with a small meal 10 units with a large meal   8 units with a small meal 10 units with a large meal  8 units with a small meal 10 units with a large meal  Lantus 35 units    - will likely need to decrease Lantus if she takes mealtime insulin as advised - she knows to call with any Qs or if sugars stay high or drop low - I will see her back in 1 month with her sugar log - continue to check 2-4 x a day - bring log at next visit

## 2013-11-02 NOTE — Patient Instructions (Signed)
Please keep Lantus at 35 units every morning.  Please take the NovoLog EVERY TIME YOU EAT a main meal, NOT ONLY if sugars are higher than 300:   Insulin Before breakfast Before lunch Before dinner  NovoLog  8 units with a small meal 10 units with a large meal   8 units with a small meal 10 units with a large meal  8 units with a small meal 10 units with a large meal  Lantus 35 units

## 2013-11-13 LAB — HM DIABETES EYE EXAM

## 2013-12-12 ENCOUNTER — Telehealth: Payer: Self-pay | Admitting: Internal Medicine

## 2013-12-12 ENCOUNTER — Emergency Department: Payer: Self-pay | Admitting: Emergency Medicine

## 2013-12-12 LAB — COMPREHENSIVE METABOLIC PANEL
ALK PHOS: 110 U/L
ANION GAP: 5 — AB (ref 7–16)
Albumin: 3.3 g/dL — ABNORMAL LOW (ref 3.4–5.0)
BILIRUBIN TOTAL: 0.5 mg/dL (ref 0.2–1.0)
BUN: 18 mg/dL (ref 7–18)
CALCIUM: 8.9 mg/dL (ref 8.5–10.1)
Chloride: 98 mmol/L (ref 98–107)
Co2: 30 mmol/L (ref 21–32)
Creatinine: 1.05 mg/dL (ref 0.60–1.30)
EGFR (African American): 58 — ABNORMAL LOW
EGFR (Non-African Amer.): 50 — ABNORMAL LOW
GLUCOSE: 304 mg/dL — AB (ref 65–99)
OSMOLALITY: 280 (ref 275–301)
Potassium: 4 mmol/L (ref 3.5–5.1)
SGOT(AST): 17 U/L (ref 15–37)
SGPT (ALT): 26 U/L (ref 12–78)
Sodium: 133 mmol/L — ABNORMAL LOW (ref 136–145)
TOTAL PROTEIN: 7.4 g/dL (ref 6.4–8.2)

## 2013-12-12 LAB — CBC WITH DIFFERENTIAL/PLATELET
BASOS PCT: 0.9 %
Basophil #: 0 10*3/uL (ref 0.0–0.1)
EOS PCT: 3.5 %
Eosinophil #: 0.2 10*3/uL (ref 0.0–0.7)
HCT: 34.3 % — ABNORMAL LOW (ref 35.0–47.0)
HGB: 11.7 g/dL — ABNORMAL LOW (ref 12.0–16.0)
LYMPHS ABS: 1.6 10*3/uL (ref 1.0–3.6)
Lymphocyte %: 30 %
MCH: 32.8 pg (ref 26.0–34.0)
MCHC: 34.2 g/dL (ref 32.0–36.0)
MCV: 96 fL (ref 80–100)
Monocyte #: 0.4 x10 3/mm (ref 0.2–0.9)
Monocyte %: 7.2 %
NEUTROS ABS: 3.1 10*3/uL (ref 1.4–6.5)
Neutrophil %: 58.4 %
PLATELETS: 243 10*3/uL (ref 150–440)
RBC: 3.58 10*6/uL — ABNORMAL LOW (ref 3.80–5.20)
RDW: 14.1 % (ref 11.5–14.5)
WBC: 5.2 10*3/uL (ref 3.6–11.0)

## 2013-12-12 NOTE — Telephone Encounter (Signed)
FYI-see below- 

## 2013-12-12 NOTE — Telephone Encounter (Signed)
Patient Information:  Caller Name: [place]  Phone: [phone]  Patient: [name] [name], [name] [name]  Gender: Female  DOB: [birth year]  Age: 80 Years  PCP: Ronette Deter (Adults only)  Office Follow Up:  Does the office need to follow up with this patient?: No  Instructions For The Office: N/A  RN Note:  Feet are so swollen she can hardly walk on them since 12/10/13.  Too weak to stand now; feels like she would faint if tried.  Mouth extremely dry.  Drinking water. Very reluctant to call 911 due to "all those hospital bills."  Explained urgency for immediate help.  Agreed to call 911 BEFORE calling grandaughter.  RN called back minutes later; patient has still not called 911 but "is fixing to do so" and agreed she would. RN call back 2nd time; patient still had not called 911; was searching around trying to find phone number for granddaughter who is not home but working at [employer].  RN again offered to call 911 for patient and she agreed.  Contacted 911 operator, symptoms provided, got patient permission to come now and medics are on their way.    Symptoms  Reason For Call & Symptoms: Swollen feet up to knees for 3-4 days and high blood sugars for past 3-4 weeks.  FBS 520 at 0800 and 554 at lunch.  Reviewed Health History In EMR: Yes  Reviewed Medications In EMR: Yes  Reviewed Allergies In EMR: Yes  Reviewed Surgeries / Procedures: Yes  Date of Onset of Symptoms: 12/09/2013  Treatments Tried: drinking water, rubbing feet with lotion for severe itching, elevating legs  Treatments Tried Worked: No  Guideline(s) Used:  Diabetes - High Blood Sugar  Disposition Per Guideline:   Call EMS 911 Now  Reason For Disposition Reached:   Very weak (e.g., can't stand)  Advice Given:  N/A  Patient Will Follow Care Advice:  YES

## 2013-12-30 ENCOUNTER — Encounter: Payer: Self-pay | Admitting: Internal Medicine

## 2013-12-30 ENCOUNTER — Ambulatory Visit (INDEPENDENT_AMBULATORY_CARE_PROVIDER_SITE_OTHER): Payer: Commercial Managed Care - HMO | Admitting: Internal Medicine

## 2013-12-30 VITALS — BP 100/66 | HR 92 | Temp 98.6°F | Ht 65.0 in | Wt 167.8 lb

## 2013-12-30 DIAGNOSIS — E1151 Type 2 diabetes mellitus with diabetic peripheral angiopathy without gangrene: Secondary | ICD-10-CM

## 2013-12-30 DIAGNOSIS — IMO0002 Reserved for concepts with insufficient information to code with codable children: Secondary | ICD-10-CM

## 2013-12-30 DIAGNOSIS — E1165 Type 2 diabetes mellitus with hyperglycemia: Principal | ICD-10-CM

## 2013-12-30 DIAGNOSIS — F419 Anxiety disorder, unspecified: Secondary | ICD-10-CM

## 2013-12-30 DIAGNOSIS — F411 Generalized anxiety disorder: Secondary | ICD-10-CM

## 2013-12-30 DIAGNOSIS — E785 Hyperlipidemia, unspecified: Secondary | ICD-10-CM

## 2013-12-30 DIAGNOSIS — E1159 Type 2 diabetes mellitus with other circulatory complications: Secondary | ICD-10-CM

## 2013-12-30 LAB — LIPID PANEL
Cholesterol: 307 mg/dL — ABNORMAL HIGH (ref 0–200)
HDL: 123.4 mg/dL (ref >39.00)
LDL Cholesterol: 165 mg/dL — ABNORMAL HIGH (ref 0–99)
NONHDL: 183.6
Total CHOL/HDL Ratio: 2
Triglycerides: 92 mg/dL (ref 0.0–149.0)
VLDL: 18.4 mg/dL (ref 0.0–40.0)

## 2013-12-30 LAB — COMPREHENSIVE METABOLIC PANEL
ALT: 29 U/L (ref 0–35)
AST: 28 U/L (ref 0–37)
Albumin: 3.4 g/dL — ABNORMAL LOW (ref 3.5–5.2)
Alkaline Phosphatase: 99 U/L (ref 39–117)
BILIRUBIN TOTAL: 0.4 mg/dL (ref 0.2–1.2)
BUN: 21 mg/dL (ref 6–23)
CALCIUM: 9.4 mg/dL (ref 8.4–10.5)
CHLORIDE: 101 meq/L (ref 96–112)
CO2: 29 meq/L (ref 19–32)
CREATININE: 1 mg/dL (ref 0.4–1.2)
GFR: 54.22 mL/min — ABNORMAL LOW (ref >60.00)
Glucose, Bld: 155 mg/dL — ABNORMAL HIGH (ref 70–99)
Potassium: 4.4 mEq/L (ref 3.5–5.1)
Sodium: 136 mEq/L (ref 135–145)
Total Protein: 6.7 g/dL (ref 6.0–8.3)

## 2013-12-30 LAB — HEMOGLOBIN A1C: HEMOGLOBIN A1C: 10.9 % — AB (ref 4.6–6.5)

## 2013-12-30 MED ORDER — ALPRAZOLAM 0.5 MG PO TABS: 0.5000 mg | ORAL_TABLET | Freq: Two times a day (BID) | ORAL | Status: DC | PRN

## 2013-12-30 MED ORDER — INSULIN ASPART 100 UNIT/ML FLEXPEN: PEN_INJECTOR | SUBCUTANEOUS | Status: DC

## 2013-12-30 MED ORDER — INSULIN GLARGINE 100 UNIT/ML SOLOSTAR PEN: 35.0000 [IU] | PEN_INJECTOR | Freq: Every day | SUBCUTANEOUS | Status: DC

## 2013-12-30 NOTE — Assessment & Plan Note (Addendum)
Very labile BG ranging from >500 to <40. Pt reports compliance with medication. Has been working with Dr. Cruzita Lederer in endocrinology but unable to travel to [place]. Will set up evaluation with Dr. Marthenia Rolling here in [place]. Will check A1c with labs today. Continue Lantus 35units qam and Novolog 8units prior to small meal and 12 units prior to large meal. Follow up 3 months. Over 25min of which >50% spent in face-to-face contact with patient discussing plan of care

## 2013-12-30 NOTE — Progress Notes (Signed)
Subjective:    Patient ID: [name] [name], female    DOB: Mar 29, 1932, 80 y.o.   MRN: [id number]  HPI 80YO female presents for follow up.  DM - BG variable from 40s to >500. Taking Lantus 35units in the morning and 8-10 units of Novolog as directed. Symptomatic with low BG with sweating, lightheadedness, shaking.  Feeling well. Planning to travel to [facility] this summer with her son.    Review of Systems  Constitutional: Negative for fever, chills, appetite change, fatigue and unexpected weight change.  HENT: Negative for congestion, ear pain, sinus pressure, sore throat, trouble swallowing and voice change.   Eyes: Negative for visual disturbance.  Respiratory: Negative for cough, shortness of breath, wheezing and stridor.   Cardiovascular: Negative for chest pain, palpitations and leg swelling.  Gastrointestinal: Negative for nausea, vomiting, abdominal pain, diarrhea, constipation, blood in stool, abdominal distention and anal bleeding.  Genitourinary: Negative for dysuria and flank pain.  Musculoskeletal: Positive for arthralgias and back pain. Negative for gait problem, myalgias and neck pain.  Skin: Negative for color change and rash.  Neurological: Negative for dizziness and headaches.  Hematological: Negative for adenopathy. Does not bruise/bleed easily.  Psychiatric/Behavioral: Negative for suicidal ideas, sleep disturbance and dysphoric mood. The patient is not nervous/anxious.        Objective:    BP 100/66  Pulse 92  Temp(Src) 98.6 F (37 C) (Oral)  Ht 5\' 5"  (1.651 m)  Wt 167 lb 12 oz (76.091 kg)  BMI 27.92 kg/m2  SpO2 93% Physical Exam  Constitutional: She is oriented to person, place, and time. She appears well-developed and well-nourished. No distress.  HENT:  Head: Normocephalic and atraumatic.  Right Ear: External ear normal.  Left Ear: External ear normal.  Nose: Nose normal.  Mouth/Throat: Oropharynx is clear and moist. No oropharyngeal exudate.  Eyes:  Conjunctivae are normal. Pupils are equal, round, and reactive to light. Right eye exhibits no discharge. Left eye exhibits no discharge. No scleral icterus.  Neck: Normal range of motion. Neck supple. No tracheal deviation present. No thyromegaly present.  Cardiovascular: Normal rate, regular rhythm, normal heart sounds and intact distal pulses.  Exam reveals no gallop and no friction rub.   No murmur heard. Pulmonary/Chest: Effort normal and breath sounds normal. No accessory muscle usage. Not tachypneic. No respiratory distress. She has no decreased breath sounds. She has no wheezes. She has no rhonchi. She has no rales. She exhibits no tenderness.  Musculoskeletal: Normal range of motion. She exhibits no edema and no tenderness.  Lymphadenopathy:    She has no cervical adenopathy.  Neurological: She is alert and oriented to person, place, and time. No cranial nerve deficit. She exhibits normal muscle tone. Coordination normal.  Skin: Skin is warm and dry. No rash noted. She is not diaphoretic. No erythema. No pallor.  Psychiatric: She has a normal mood and affect. Her behavior is normal. Judgment and thought content normal.          Assessment & Plan:   Problem List Items Addressed This Visit     Unprioritized   Anxiety     Symptoms well controlled with alprazolam prn at bedtime only for severe anxiety.    Relevant Medications      ALPRAZolam (XANAX) tablet   DM (diabetes mellitus) type II uncontrolled, periph vascular disorder - Primary     Very labile BG ranging from >500 to <40. Pt reports compliance with medication. Has been working with Dr. Cruzita Lederer in endocrinology but unable  to travel to [place]. Will set up evaluation with Dr. Marthenia Rolling here in [place]. Will check A1c with labs today. Continue Lantus 35units qam and Novolog 8units prior to small meal and 12 units prior to large meal. Follow up 3 months. Over 60min of which >50% spent in face-to-face contact with patient  discussing plan of care     Relevant Medications      insulin aspart (NOVOLOG FLEXPEN) 100 UNIT/ML FlexPen      Insulin Glargine (LANTUS SOLOSTAR) 100 UNIT/ML Solostar Pen   Other Relevant Orders      Ambulatory referral to Endocrinology      Comprehensive metabolic panel      Hemoglobin A1c      Microalbumin / creatinine urine ratio   Hyperlipidemia LDL goal < 70   Relevant Orders      Lipid panel    Other Visit Diagnoses   Anxiety state, unspecified        Relevant Medications       ALPRAZolam (XANAX) tablet        Return in about 3 months (around 04/01/2014) for Recheck of Diabetes.

## 2013-12-30 NOTE — Progress Notes (Signed)
Pre visit review using our clinic review tool, if applicable. No additional management support is needed unless otherwise documented below in the visit note. 

## 2013-12-30 NOTE — Assessment & Plan Note (Signed)
Symptoms well controlled with alprazolam prn at bedtime only for severe anxiety.

## 2013-12-30 NOTE — Addendum Note (Signed)
Addended by: Harl Bowie on: 12/30/2013 03:18 PM   Modules accepted: Orders

## 2014-01-25 ENCOUNTER — Encounter: Payer: Self-pay | Admitting: Internal Medicine

## 2014-02-22 ENCOUNTER — Encounter: Payer: Self-pay | Admitting: Endocrinology

## 2014-02-22 ENCOUNTER — Ambulatory Visit (INDEPENDENT_AMBULATORY_CARE_PROVIDER_SITE_OTHER): Payer: Commercial Managed Care - HMO | Admitting: Endocrinology

## 2014-02-22 ENCOUNTER — Other Ambulatory Visit: Payer: Self-pay | Admitting: *Deleted

## 2014-02-22 VITALS — BP 98/60 | HR 88 | Temp 98.3°F | Ht 65.0 in | Wt 166.8 lb

## 2014-02-22 DIAGNOSIS — E1122 Type 2 diabetes mellitus with diabetic chronic kidney disease: Secondary | ICD-10-CM

## 2014-02-22 DIAGNOSIS — N183 Chronic kidney disease, stage 3 unspecified: Secondary | ICD-10-CM | POA: Diagnosis not present

## 2014-02-22 DIAGNOSIS — I798 Other disorders of arteries, arterioles and capillaries in diseases classified elsewhere: Secondary | ICD-10-CM | POA: Diagnosis not present

## 2014-02-22 DIAGNOSIS — E1151 Type 2 diabetes mellitus with diabetic peripheral angiopathy without gangrene: Secondary | ICD-10-CM

## 2014-02-22 DIAGNOSIS — E1159 Type 2 diabetes mellitus with other circulatory complications: Secondary | ICD-10-CM

## 2014-02-22 DIAGNOSIS — E039 Hypothyroidism, unspecified: Secondary | ICD-10-CM

## 2014-02-22 DIAGNOSIS — E1129 Type 2 diabetes mellitus with other diabetic kidney complication: Secondary | ICD-10-CM

## 2014-02-22 DIAGNOSIS — IMO0002 Reserved for concepts with insufficient information to code with codable children: Secondary | ICD-10-CM

## 2014-02-22 DIAGNOSIS — E1165 Type 2 diabetes mellitus with hyperglycemia: Principal | ICD-10-CM

## 2014-02-22 MED ORDER — GLUCOSE BLOOD VI STRP: ORAL_STRIP | Status: DC

## 2014-02-22 MED ORDER — LEVOTHYROXINE SODIUM 88 MCG PO TABS: 88.0000 ug | ORAL_TABLET | Freq: Every day | ORAL | Status: DC

## 2014-02-22 NOTE — Progress Notes (Signed)
REASON FOR VISIT- [place] is a 80 y.o.-year-old female, here for follow up management of Type 2 Diabetes Mellitus, uncontrolled, with complications ( diagnosed in 2009, on insulin therapy since 2011 with basal/bolus insulin therapy and variable FS readings due to in part medication noncompliance in the past; complications of  CAD s/p MI, PVD, Stage 3 CKD, Peripheral neuropathy) . Last seen by Dr. Cruzita Lederer in April 2015.    Currently taking  -Lantus  28 Units am ~9am, not missing any doses. Unclear why she self decreased her dose of Lantus from 35 units daily that she was supposed to be on. - Novolog according to sliding scale between 8-12 units with BF and supper. 6-8  Units for smaller meals. Not usually having lunch. Bases amount of Novolog on her meal size. Taking Novolog sometimes after meals and sometimes up to 1.5 hours prior to meals.   Last hemoglobin A1c is- Lab Results  Component Value Date   HGBA1C 10.9* 12/30/2013     Patient checks her sugars 3-6  times daily with a  [employer]  glucometer. By log book  her sugars are- Her log book is not dated, some readings are pre meals and some PP ( taken 1-2 hours post meals). PREMEAL Breakfast Lunch Dinner Bedtime Overall  Glucose range: 250  250-598    Mean/median:        Hypoglycemia-  No recent significant lows. Lowest sugar was 56 over a 1 month ago per her report; she has hypoglycemia awareness at 60/65. No previous hypoglycemia admission recently.  Hyperglycemia- Highest sugar was 598.     Dietary Habits- Eats two times daily. Tries to limit carbs, sodas, desserts. Exercise- No formal exercise. Self sufficient with ADLs, lives with son/ Weight- Weight has been stable recently.  Wt Readings from Last 3 Encounters:  02/22/14 166 lb 12 oz (75.637 kg)  12/30/13 167 lb 12 oz (76.091 kg)  11/02/13 166 lb 12 oz (75.637 kg)    Diabetes Complications- Has known complications Nephropathy-- Yes  CKD, Stage 3 with GFR in 54 range,  last BUN/creatinine- Lab Results  Component Value Date   BUN 21 12/30/2013   CREATININE 1.0 12/30/2013    Retinopathy- No,  Last DEE was 3 months ago. Neuropathy- Has symptoms of tingling and numbness in her feet. Associated history - has CAD, PVD . No prior stroke. Has hyperlipidemia, but intolerant to statins. Has hypothyroidism. her last set of lipids were-  Lab Results  Component Value Date   CHOL 307* 12/30/2013   HDL 123.40 12/30/2013   LDLCALC 165* 12/30/2013   LDLDIRECT 138.1 06/20/2013   TRIG 92.0 12/30/2013   CHOLHDL 2 12/30/2013   her last TSH was  Lab Results  Component Value Date   TSH 5.45 09/27/2013    I have reviewed the patient's past medical history, medications and allergies.     REVIEW OF SYSTEMS- Denies Recent weight change,Fatigue, Increased thirst Denies vision difficulty, chest pain, SOB, leg swelling, cough, N/V/Diarrhea/constipation, abdominal pain. Denies concern with her feet. Reports polyuria and nocturia.  Reports tingling and numbness in legs.     PHYSICAL EXAM- BP 98/60  Pulse 88  Temp(Src) 98.3 F (36.8 C) (Oral)  Ht 5\' 5"  (1.651 m)  Wt 166 lb 12 oz (75.637 kg)  BMI 27.75 kg/m2  SpO2 93% Wt Readings from Last 3 Encounters:  02/22/14 166 lb 12 oz (75.637 kg)  12/30/13 167 lb 12 oz (76.091 kg)  11/02/13 166 lb 12 oz (75.637 kg)  GENERAL: No acute distress HEENT:  Eye exam shows normal external appearance. Oral exam shows normal mucosa .  NECK:   Neck exam shows no lymphadenopathy. No Carotids bruits. Thyroid is not enlarged and no nodules felt.   LUNGS:         Chest is symmetrical. Lungs are clear to auscultation.Marland Kitchen   HEART:         Heart sounds:  S1 and S2 are normal. No murmurs or clicks heard. ABDOMEN:  No Distention present. Liver and spleen are not palpable. No other mass or tenderness present.  EXTREMITIES:     There is no edema. No skin lesions present.Marland Kitchen  NEUROLOGICAL:     Grossly intact.                  ASSESSMENT/PLAN-     ICD-9-CM  1. DM (diabetes mellitus) type II uncontrolled, periph vascular disorder 250.72   443.81  2. Type 2 diabetes mellitus with renal manifestations not at goal 250.40  3. CKD stage 3 due to type 2 diabetes mellitus 250.40   585.3   - Discussed goal A1c and sugars  - We discussed about changes to her insulin regimen, as follows:  -Patient Instructions   Check sugars 4 x daily ( before each meal and at bedtime).  Record them in a log book and bring that/meter to next appointment. Record units of Novolog taken with each meal on that report.   Take Lantus at roughly the same time every morning. Increase dose to 32 units daily.  Take Novolog with each meal. If you are skipping a meal, then skip that shot of Novolog. Give Novolog as you are ready to eat your meal.  Move insulin injection sites along with the belly for better absorption.  Follow Novolog instructions according to chart below.  Please come back for a follow-up appointment in 2 weeks   Blood Glucose (mg/dL)  Breakfast  (Units Novolog Insulin)  Lunch  (Units Novolog Insulin)  Supper  (Units Novolog Insulin)  Nighttime (Units Novolog Insulin)   <70 Treat the low blood sugar.  Recheck blood glucose  in 15 mins. If sugar is more than 70, then take the number of units of insulin in the 70-90 row, if before a meal.   70-90  2  2  2   0  91-130 (Base Dose)  4  4  4   0   131-150  6  6  6   0   151-200  8  8  8   0   201-250  9  9  9  0   251-300  10  10  10 1    301-350  11  11  11 2    351-400  12  12  12  3    401-450  13  13  13 4    >450  14  14  14  5    Lantus insulin 32 units subcutaneous Morning        - given instructions for hypoglycemia management "15-15 rule"   2. Diabetes with nephropathy- discussed need for better sugar control to help slow progression of her kidney dysfunction. Recommend hydration and PO fluids with water when her sugars are high.  3. Stage 3  CKD- recently checked and stable.   25 minutes spent with the patient, > 50% time spent on patient counselling, discussion of insulin regimen, hypoglycemia management as detailed above.  Saya Mccoll PUSHKAR 02/22/2014, 4:10 PM

## 2014-02-22 NOTE — Progress Notes (Signed)
Pre visit review using our clinic review tool, if applicable. No additional management support is needed unless otherwise documented below in the visit note. 

## 2014-02-22 NOTE — Patient Instructions (Signed)
Check sugars 4 x daily ( before each meal and at bedtime).  Record them in a log book and bring that/meter to next appointment. Record units of Novolog taken with each meal on that report.   Take Lantus at roughly the same time every morning. Increase dose to 32 units daily.  Take Novolog with each meal. If you are skipping a meal, then skip that shot of Novolog. Give Novolog as you are ready to eat your meal.  Move insulin injection sites along with the belly for better absorption.  Follow Novolog instructions according to chart below.  Please come back for a follow-up appointment in 2 weeks   Blood Glucose (mg/dL)  Breakfast  (Units Novolog Insulin)  Lunch  (Units Novolog Insulin)  Supper  (Units Novolog Insulin)  Nighttime (Units Novolog Insulin)   <70 Treat the low blood sugar.  Recheck blood glucose  in 15 mins. If sugar is more than 70, then take the number of units of insulin in the 70-90 row, if before a meal.   70-90  2  2  2   0  91-130 (Base Dose)  4  4  4   0   131-150  6  6  6   0   151-200  8  8  8   0   201-250  9  9  9  0   251-300  10  10  10 1    301-350  11  11  11 2    351-400  12  12  12  3    401-450  13  13  13 4    >450  14  14  14  5    Lantus insulin 32 units subcutaneous Morning

## 2014-03-08 ENCOUNTER — Encounter: Payer: Self-pay | Admitting: Endocrinology

## 2014-03-08 ENCOUNTER — Other Ambulatory Visit: Payer: Self-pay | Admitting: Internal Medicine

## 2014-03-08 ENCOUNTER — Ambulatory Visit (INDEPENDENT_AMBULATORY_CARE_PROVIDER_SITE_OTHER): Payer: Commercial Managed Care - HMO | Admitting: Endocrinology

## 2014-03-08 ENCOUNTER — Telehealth: Payer: Self-pay | Admitting: *Deleted

## 2014-03-08 VITALS — BP 102/64 | HR 89 | Temp 98.1°F | Ht 65.0 in | Wt 165.2 lb

## 2014-03-08 DIAGNOSIS — E1159 Type 2 diabetes mellitus with other circulatory complications: Secondary | ICD-10-CM

## 2014-03-08 DIAGNOSIS — E1165 Type 2 diabetes mellitus with hyperglycemia: Principal | ICD-10-CM

## 2014-03-08 DIAGNOSIS — R059 Cough, unspecified: Secondary | ICD-10-CM

## 2014-03-08 DIAGNOSIS — IMO0002 Reserved for concepts with insufficient information to code with codable children: Secondary | ICD-10-CM

## 2014-03-08 DIAGNOSIS — E1151 Type 2 diabetes mellitus with diabetic peripheral angiopathy without gangrene: Secondary | ICD-10-CM

## 2014-03-08 DIAGNOSIS — R05 Cough: Secondary | ICD-10-CM

## 2014-03-08 DIAGNOSIS — Z794 Long term (current) use of insulin: Secondary | ICD-10-CM

## 2014-03-08 DIAGNOSIS — I798 Other disorders of arteries, arterioles and capillaries in diseases classified elsewhere: Secondary | ICD-10-CM

## 2014-03-08 LAB — GLUCOSE, POCT (MANUAL RESULT ENTRY): POC Glucose: 333 mg/dl — AB (ref 70–99)

## 2014-03-08 LAB — HM DIABETES FOOT EXAM: HM Diabetic Foot Exam: NORMAL

## 2014-03-08 MED ORDER — INSULIN GLARGINE 100 UNIT/ML SOLOSTAR PEN: 32.0000 [IU] | PEN_INJECTOR | Freq: Every day | SUBCUTANEOUS | Status: DC

## 2014-03-08 MED ORDER — INSULIN ASPART 100 UNIT/ML FLEXPEN: PEN_INJECTOR | SUBCUTANEOUS | Status: DC

## 2014-03-08 NOTE — Progress Notes (Signed)
REASON FOR VISIT- [name] [name] is a 80 y.o.-year-old female, here for follow up management of Type 2 Diabetes Mellitus, uncontrolled, with complications ( diagnosed in 2009, on insulin therapy since 2011 with basal/bolus insulin therapy and variable FS readings due to in part medication noncompliance in the past; complications of  CAD s/p MI, PVD, Stage 3 CKD, Peripheral neuropathy) . Last seen by me about 1 month ago.  Reports having a productive cough for the past week without fever, chest pain or shortness of breath. Has appointment for acute care tomorrow. Higher sugars now.     Currently taking  -Lantus  32 Units am ~9am, not missing any doses. Was on 28 units at last visit.  - Novolog with each meal. She is supposed to be taking according to sliding scale ( is supposed to be on 4+1 scale qac) Is taking 8 units with Bf and supper. Now eating something lunch, sometimes takes Novolog 8 units at that time. Now taking prior to meals about 10-15 minutes. Rotating injection sites.   Last hemoglobin A1c is- Lab Results  Component Value Date   HGBA1C 10.9* 12/30/2013    Patient checks her sugars 3 times daily with a  [employer]  glucometer. By log book  her sugars are-  PREMEAL Breakfast Lunch Dinner Bedtime Overall  Glucose range: 104-287 175-478 72-hi 145-259   Mean/median:        Hypoglycemia-  No recent significant lows. Lowest sugar was 72 few weeks ago, unclear cause. Patient reports that one day she had an episode where " she didn't remember anything for about 4 hours". Don't know if she was hypoglycemic then as she didn't check her sugar. Denies syncope.  she has hypoglycemia awareness at 60/65. No recent  hypoglycemia admissions.  Hyperglycemia- Highest sugar was " hi" today and multiple times over last week.  Dietary Habits- Eats two-three times daily. Tries to limit carbs, sodas, desserts. Exercise- No formal exercise. Self sufficient with ADLs, lives with son/ Weight- Weight has  been generally stable recently.  Wt Readings from Last 3 Encounters:  03/08/14 165 lb 4 oz (74.957 kg)  02/22/14 166 lb 12 oz (75.637 kg)  12/30/13 167 lb 12 oz (76.091 kg)    Diabetes Complications- Has known complications Nephropathy-- Yes  CKD, Stage 3 with GFR in 54 range, last BUN/creatinine- Lab Results  Component Value Date   BUN 21 12/30/2013   CREATININE 1.0 12/30/2013    Retinopathy- No,  Last DEE was earlier in 2015. Neuropathy- Yes, Has symptoms of tingling and numbness in her feet. Regular self foot care.  Associated history - has CAD, PVD . No prior stroke. Has hyperlipidemia, but intolerant to statins. Has hypothyroidism. her last set of lipids were-  Lab Results  Component Value Date   CHOL 307* 12/30/2013   HDL 123.40 12/30/2013   LDLCALC 165* 12/30/2013   LDLDIRECT 138.1 06/20/2013   TRIG 92.0 12/30/2013   CHOLHDL 2 12/30/2013     I have reviewed the patient's past medical history, medications and allergies.  Outpatient Encounter Prescriptions as of 03/08/2014  Medication Sig  . ALPRAZolam (XANAX) 0.5 MG tablet Take 1 tablet (0.5 mg total) by mouth 2 (two) times daily as needed for anxiety.  Marland Kitchen glucose blood (TRUETRACK TEST) test strip Use as directed 4 to 6 times daily Dx: 250.02  . insulin aspart (NOVOLOG FLEXPEN) 100 UNIT/ML FlexPen Take on a sliding scale between 8-16 units with each meal. Max daily dose 60 units.  . Insulin  Glargine (LANTUS SOLOSTAR) 100 UNIT/ML Solostar Pen Inject 32 Units into the skin daily.  Marland Kitchen levothyroxine (SYNTHROID, LEVOTHROID) 88 MCG tablet Take 1 tablet (88 mcg total) by mouth daily.  . pantoprazole (PROTONIX) 40 MG tablet TAKE 1 TABLET BY MOUTH TWICE DAILY  . [DISCONTINUED] insulin aspart (NOVOLOG FLEXPEN) 100 UNIT/ML FlexPen Take 8 units prior to small meals and 12 units prior to a large meal.  . [DISCONTINUED] Insulin Glargine (LANTUS SOLOSTAR) 100 UNIT/ML Solostar Pen Inject 35 Units into the skin daily.      REVIEW OF SYSTEMS- [ x ]   Complains of    [  ]  denies [  ] Recent weight change [  ]  Fatigue [  ] polydipsia [  ] polyuria [  ]  nocturia [  ]  vision difficulty [  ] chest pain [  ] shortness of breath [  ] leg swelling [ x ] cough [  ] nausea/vomiting [  ] diarrhea [  ] constipation [  ] abdominal pain [ x ]  tingling/numbness in extremities [  ]  concern with feet ( wounds/sores)     PHYSICAL EXAM- BP 102/64  Pulse 89  Temp(Src) 98.1 F (36.7 C) (Oral)  Ht 5\' 5"  (1.651 m)  Wt 165 lb 4 oz (74.957 kg)  BMI 27.50 kg/m2  SpO2 94% Wt Readings from Last 3 Encounters:  03/08/14 165 lb 4 oz (74.957 kg)  02/22/14 166 lb 12 oz (75.637 kg)  12/30/13 167 lb 12 oz (76.091 kg)   Filed Vitals:   03/08/14 1443  BP: 102/64  Pulse: 89  Temp: 98.1 F (36.7 C)    GENERAL: No acute distress LUNGS:         Chest is symmetrical. Lungs are clear to auscultation.Marland Kitchen   HEART:         Heart sounds:  S1 and S2 are normal. No murmurs or clicks heard. EXTREMITIES:     There is no edema.  Diabetes foot exam performed with shoes and socks removed. Presence of normal MF testing bilaterally. Nails Not Dystrophic, no open wounds over feet, skin normal color, no hammertoes.  2+ DP, PT pulses, good foot hygiene          ASSESSMENT/PLAN-  Problem List Items Addressed This Visit     Cardiovascular and Mediastinum   DM (diabetes mellitus) type II uncontrolled, periph vascular disorder - Primary     BS values are now consistently elevated in the day. Morning readings are closer to goal.  Higher sugars are likely secondary to URI as well. Increased Novolog to 8+1 scale with Breakfast and supper. She will follow this scale at lunch time, if she is eating her meal then.   Copy of scale given to patient and she voiced understanding.  Continue current Lantus 32 units qam.  Check sugars four times daily. Report back if continues to have problems with high or low sugars.  A1c at next visit in 1 month.  Given one touch ultra mini meter  as back up as pt reports her True track meter is occasionally not recording her readings. She will let me know if she prefers for [place] to send in those testing supplies.  Sugar level in office is downtrending but still elevated from prior one taken this afternoon.       Relevant Medications      insulin aspart (NOVOLOG FLEXPEN) 100 UNIT/ML FlexPen      Insulin Glargine (LANTUS SOLOSTAR) 100 UNIT/ML Solostar  Pen   Other Relevant Orders      POCT Glucose (CBG) (Completed)     Other   Long term current use of insulin     Discussed proper use of Novolog and Lantus, and the insulin plan in detail. Hypoglycemia recognition and correction was discussed at prior visits and briefly revisited at this visit.        Tanish Prien PUSHKAR 03/08/2014, 4:07 PM

## 2014-03-08 NOTE — Progress Notes (Signed)
Pre visit review using our clinic review tool, if applicable. No additional management support is needed unless otherwise documented below in the visit note. 

## 2014-03-08 NOTE — Assessment & Plan Note (Addendum)
BS values are now consistently elevated in the day. Morning readings are closer to goal.  Higher sugars are likely secondary to URI as well. Increased Novolog to 8+1 scale with Breakfast and supper. She will follow this scale at lunch time, if she is eating her meal then.   Copy of scale given to patient and she voiced understanding.  Continue current Lantus 32 units qam.  Check sugars four times daily. Report back if continues to have problems with high or low sugars.  A1c at next visit in 1 month.  Given one touch ultra mini meter as back up as pt reports her True track meter is occasionally not recording her readings. She will let me know if she prefers for [place] to send in those testing supplies.  Sugar level in office is downtrending but still elevated from prior one taken this afternoon.

## 2014-03-08 NOTE — Telephone Encounter (Signed)
PT in office for appointment with Dr. Howell Rucks. She has productive cough x 1 week; wheezing at times, not currently; no fever, aches, chills. "Feels bad", states a history of pneumonia. Scheduled appt with Raquel for tomorrow at 9:45. Dr. Gilford Rile notified in office, chest xray ordered.  Advised pt to go to [facility] office in the morning prior to appt if possible, or she may go to [facility] this afternoon to have it done. She would rather go to [facility] office in the morning. If symptoms worsen, advised to be seen tonight in Urgent Care.

## 2014-03-08 NOTE — Assessment & Plan Note (Signed)
Discussed proper use of Novolog and Lantus, and the insulin plan in detail. Hypoglycemia recognition and correction was discussed at prior visits and briefly revisited at this visit.

## 2014-03-08 NOTE — Patient Instructions (Addendum)
Please visit PCP for assessment of cough. Continue current lantus 32 units daily.  Change novolog to 8+1 scale with each meal ( see attached detailed scale)  Check sugars 4 x daily ( before each meal and at bedtime).  Record them in a log book and bring that/meter to next appointment.   Please come back for a follow-up appointment in 1 month.   Blood Glucose (mg/dL)  Breakfast  (Units Novolog Insulin)  Lunch  (Units Novolog Insulin)  Supper  (Units Novolog Insulin)  Nighttime (Units Novolog Insulin)   <70 Treat the low blood sugar.  Recheck blood glucose  in 15 mins. If sugar is more than 70, then take the number of units of insulin in the 70-90 row, if before a meal.   70-90   5   5   5   0  91-130 (Base Dose)  8  8  8   0   131-150  9  9  9   0   151-200  10  10  10   0   201-250  11  11  11  0   251-300  12  12  12 1    301-350  13  13  13 2    351-400  14  14  14  3    401-450  15  15  15 4    >450  16  16  16  5    Lantus insulin 32 units subcutaneous Morning

## 2014-03-09 ENCOUNTER — Encounter: Payer: Self-pay | Admitting: Adult Health

## 2014-03-09 ENCOUNTER — Ambulatory Visit (INDEPENDENT_AMBULATORY_CARE_PROVIDER_SITE_OTHER)
Admission: RE | Admit: 2014-03-09 | Discharge: 2014-03-09 | Disposition: A | Discharge status: Home / Self Care | Payer: Commercial Managed Care - HMO | Source: Ambulatory Visit | Attending: Internal Medicine | Admitting: Internal Medicine

## 2014-03-09 ENCOUNTER — Ambulatory Visit (INDEPENDENT_AMBULATORY_CARE_PROVIDER_SITE_OTHER): Payer: Commercial Managed Care - HMO | Admitting: Adult Health

## 2014-03-09 VITALS — BP 90/62 | HR 81 | Temp 97.6°F | Resp 14 | Ht 65.0 in | Wt 164.2 lb

## 2014-03-09 DIAGNOSIS — R059 Cough, unspecified: Secondary | ICD-10-CM

## 2014-03-09 DIAGNOSIS — R05 Cough: Secondary | ICD-10-CM

## 2014-03-09 MED ORDER — AZITHROMYCIN 250 MG PO TABS: ORAL_TABLET | ORAL | Status: DC

## 2014-03-09 NOTE — Progress Notes (Signed)
Pre visit review using our clinic review tool, if applicable. No additional management support is needed unless otherwise documented below in the visit note. 

## 2014-03-09 NOTE — Patient Instructions (Signed)
  Start Azithromycin 2 tablets today then 1 tablet daily for the next 4 days.  Take over the counter Robitussin or Delsym. Make sure it contains guaifenesin and dextromethorphan. This is for your cough.  Drink plenty of fluids to stay hydrated.  Take tylenol for fever, chills or general discomfort.

## 2014-03-09 NOTE — Progress Notes (Signed)
Patient ID: [name] [name], female   DOB: 08-Jun-1932, 80 y.o.   MRN: [id number]    Subjective:    Patient ID: [name] [name], female    DOB: 1932/01/25, 80 y.o.   MRN: [id number]  HPI  Patient is a pleasant 80 year old female who presents to clinic with congested, productive cough, chills and sore throat. Her symptoms began approximately one week ago. She reports [name] not taken any medication. She is coughing up yellow secretions. No shortness of breath.   Past Medical History  Diagnosis Date  . Diabetes mellitus   . Cancer     2004 Right breast, found on mammogram, radiation therapy, Dr. Bryson Ha, uterine cancer,   . OSA (obstructive sleep apnea)   . Osteoporosis 02/21/09    DEXA scan showed osteoporosis with left femur T-score -2.8.  Marland Kitchen Thyroid disease     Hypothyroid  . Hyperlipidemia   . CAD (coronary artery disease)   . Anxiety state, unspecified   . Vitamin D deficiency   . Esophageal reflux   . Myocardial infarction     Cath negative except for 40% occlusion LAD.  Pt not candidate for betablocker or ACEI because of hypotension  . Hypotension   . Presbyacusis   . COPD (chronic obstructive pulmonary disease)   . Hearing loss   . IBS (irritable bowel syndrome)   . Heart burn     Current Outpatient Prescriptions on File Prior to Visit  Medication Sig Dispense Refill  . ALPRAZolam (XANAX) 0.5 MG tablet Take 1 tablet (0.5 mg total) by mouth 2 (two) times daily as needed for anxiety.  60 tablet  3  . glucose blood (TRUETRACK TEST) test strip Use as directed 4 to 6 times daily Dx: 250.02  150 each  12  . insulin aspart (NOVOLOG FLEXPEN) 100 UNIT/ML FlexPen Take on a sliding scale between 8-16 units with each meal. Max daily dose 60 units.  15 mL  11  . Insulin Glargine (LANTUS SOLOSTAR) 100 UNIT/ML Solostar Pen Inject 32 Units into the skin daily.  15 mL  6  . levothyroxine (SYNTHROID, LEVOTHROID) 88 MCG tablet Take 1 tablet (88 mcg total) by mouth daily.  90 tablet  3  .  pantoprazole (PROTONIX) 40 MG tablet TAKE 1 TABLET BY MOUTH TWICE DAILY  180 tablet  1   No current facility-administered medications on file prior to visit.     Review of Systems  Constitutional: Positive for chills. Negative for fever.  HENT: Positive for sore throat.   Respiratory: Positive for cough. Negative for shortness of breath and wheezing.        Chest congestion  Cardiovascular: Negative.   Gastrointestinal: Negative.   Genitourinary: Negative.   Neurological: Negative.   Psychiatric/Behavioral: Negative.   All other systems reviewed and are negative.      Objective:  BP 90/62  Pulse 81  Temp(Src) 97.6 F (36.4 C) (Oral)  Resp 14  Ht 5\' 5"  (1.651 m)  Wt 164 lb 4 oz (74.503 kg)  BMI 27.33 kg/m2  SpO2 95%   Physical Exam  Constitutional: She is oriented to person, place, and time. No distress.  HENT:  Mouth/Throat: Oropharynx is clear and moist.  Cardiovascular: Normal rate and regular rhythm.   Pulmonary/Chest: Effort normal. No respiratory distress. [name] no wheezes. [name] no rales.  Scattered rhonchi which clears with cough  Musculoskeletal: Normal range of motion.  Lymphadenopathy:    [name] cervical adenopathy.  Neurological: She is alert and  oriented to person, place, and time.  Skin: Skin is warm and dry.  Psychiatric: [name] a normal mood and affect. Her behavior is normal. Judgment and thought content normal.      Assessment & Plan:   1. Cough Suspect bronchitis Start Azithromycin and Robitussin or Delsym for cough Tylenol for fever/chills, general discomfort RTC if no improvement within 3-4 days.

## 2014-03-13 ENCOUNTER — Telehealth: Payer: Self-pay | Admitting: *Deleted

## 2014-03-13 ENCOUNTER — Other Ambulatory Visit: Payer: Self-pay | Admitting: Family Medicine

## 2014-03-13 ENCOUNTER — Other Ambulatory Visit: Payer: Self-pay | Admitting: *Deleted

## 2014-03-13 MED ORDER — GLUCOSE BLOOD VI STRP: ORAL_STRIP | Status: DC

## 2014-03-13 MED ORDER — PRODIGY AUTOCODE BLOOD GLUCOSE DEVI: Status: DC

## 2014-03-13 NOTE — Telephone Encounter (Signed)
Fax from pharmacy, insurance does not cover test strip with directions 6 times daily.Resent as 4 times daily

## 2014-03-13 NOTE — Telephone Encounter (Signed)
Fax from pharmacy, insurance does not cover true test test strips. Will cover roche, nipro, or prodigy.

## 2014-03-15 ENCOUNTER — Telehealth: Payer: Self-pay | Admitting: *Deleted

## 2014-03-15 NOTE — Telephone Encounter (Signed)
Pt notified and verbalized understanding.  While on phone, pt states she continues to have a cough. Completed antibiotic from last week. Cough has improved but has not completely resolved. Did not take any OTC cough medications as directed. Denies fever.

## 2014-03-15 NOTE — Telephone Encounter (Signed)
Spoke to patient about her glucose readings over last week. 8/13 AM 90, pre-lunch 329, pre dinner 210. 8/14 AM 93, pre-lunch 124, pre-dinner 122. 8/15 AM 135, pre lunch 107, predinner HIGH. 8/16 AM 289, prelunch HIGH. 8/17 AM 194, pre lunch 476. 8/18 AM 99. 8/19 120. Taking insulin as directed per last visit.

## 2014-03-15 NOTE — Telephone Encounter (Signed)
Let's make her a follow up visit next week to recheck. Unless, any shortness of breath, fever, then needs to be seen asap.

## 2014-03-15 NOTE — Telephone Encounter (Signed)
Thanks for calling her. Could you please let her know of msg below- Her sugars are variable, but looking so much better on certain days. I suspect that her higher sugars are with dietary indiscretions. Work on eating better.  I would like her to check sugars 4 x daily. She hasn't been doing this. If she does check like directed, then she will be able to follow the meal time scale three times daily , and she seems to be doing better on days when she checks like she should. She should always check her sugars after a reading says" hi" to make sure that it comes down.   No changes to insulin regimen for now.   Thanks

## 2014-03-16 NOTE — Telephone Encounter (Signed)
Scheduled pt appt for next week

## 2014-03-22 ENCOUNTER — Telehealth: Payer: Self-pay | Admitting: Internal Medicine

## 2014-03-22 ENCOUNTER — Ambulatory Visit: Payer: Commercial Managed Care - HMO | Admitting: Internal Medicine

## 2014-03-22 DIAGNOSIS — Z0289 Encounter for other administrative examinations: Secondary | ICD-10-CM

## 2014-03-22 NOTE — Telephone Encounter (Signed)
[name] [name] did not show for her appointment today. Can you call her and make sure everything okay? Thanks

## 2014-03-22 NOTE — Telephone Encounter (Signed)
Spoke with patient, doing well, no complaints today, improved from last visit. Didn't keep appointment because she knew she had one 04/06/14 and was going to just come to that one.

## 2014-03-24 ENCOUNTER — Ambulatory Visit: Payer: Commercial Managed Care - HMO | Admitting: Internal Medicine

## 2014-04-06 ENCOUNTER — Ambulatory Visit: Payer: Commercial Managed Care - HMO | Admitting: Internal Medicine

## 2014-04-06 ENCOUNTER — Other Ambulatory Visit: Payer: Self-pay | Admitting: Internal Medicine

## 2014-04-11 ENCOUNTER — Ambulatory Visit: Payer: Commercial Managed Care - HMO | Admitting: Internal Medicine

## 2014-04-11 DIAGNOSIS — Z0289 Encounter for other administrative examinations: Secondary | ICD-10-CM

## 2014-04-14 ENCOUNTER — Telehealth: Payer: Self-pay | Admitting: *Deleted

## 2014-04-14 NOTE — Telephone Encounter (Signed)
Deloria Lair, NP Care Manager with [facility] called to clarify pt's insulin dosage and sliding scale. Pt has been hypoglycemic. Pt is only doing 8 units at each meal of her Novolog and not using the sliding scale. She wanted Dr Howell Rucks to know that the pt has not had anyone to look after her to be sure that she is following her orders. Pt will have a new RN, Rose Pierzchala, with [facility] next week. Ms Myrtie Neither just wanted Dr Howell Rucks to be aware of what is going on with the pt. Be advised.

## 2014-04-14 NOTE — Telephone Encounter (Signed)
04/08/14 179  8:30 AM    498 noon    "high"  6:00 PM 04/09/14  249  8:30 AM     338  Noon    Did not check with dinner 04/10/14  147 8:30 AM    Did not check prior to lunch or dinner 04/11/14 103  8:30 AM    250  Noon    306  6:00 PM 04/12/14 154  8:30AM    303  Noon    331  6:00PM 04/13/14  51  8:30 AM    179  10:00 am    388  5:30 PM 04/14/14 70  8:30 am    338 4:30 pm  States she is taking Novolog 8 units with every meal (holding for low readings), is not following sliding scale. Confirmed taking Lantus 32 units daily No change in diet, denied illness

## 2014-04-14 NOTE — Telephone Encounter (Signed)
Please could you call the patient and obtain last 5 days of sugar readings with timings so that insulin adjustments could be made, since she is noting hypoglycemia.

## 2014-04-17 NOTE — Telephone Encounter (Signed)
Based on these readings, there is some mild hypoglycemia in the mornings. There is actually hyperglycemia during the day.  Please ask her to decrease lantus to 30 units in the morning.  Try taking the Novolog scale during the day like we discussed in clinic, rather than a stding dose.  Please make follow up appt.  Notify if contd hypoglycemia.

## 2014-04-17 NOTE — Telephone Encounter (Signed)
Pt notified, unsure where her sliding scale is currently. Scheduled appt 04/20/14 for follow up and to received instruction on new sliding scale.

## 2014-04-20 ENCOUNTER — Encounter: Payer: Self-pay | Admitting: Endocrinology

## 2014-04-20 ENCOUNTER — Ambulatory Visit (INDEPENDENT_AMBULATORY_CARE_PROVIDER_SITE_OTHER): Payer: Commercial Managed Care - HMO | Admitting: Endocrinology

## 2014-04-20 VITALS — BP 100/60 | HR 88 | Resp 14 | Ht 65.0 in | Wt 164.8 lb

## 2014-04-20 DIAGNOSIS — N183 Chronic kidney disease, stage 3 unspecified: Secondary | ICD-10-CM

## 2014-04-20 DIAGNOSIS — E1122 Type 2 diabetes mellitus with diabetic chronic kidney disease: Secondary | ICD-10-CM

## 2014-04-20 DIAGNOSIS — E1129 Type 2 diabetes mellitus with other diabetic kidney complication: Secondary | ICD-10-CM

## 2014-04-20 DIAGNOSIS — Z794 Long term (current) use of insulin: Secondary | ICD-10-CM

## 2014-04-20 NOTE — Progress Notes (Signed)
Pre visit review using our clinic review tool, if applicable. No additional management support is needed unless otherwise documented below in the visit note. 

## 2014-04-20 NOTE — Assessment & Plan Note (Signed)
BS values are now consistently elevated in the day. Morning readings are closer to goal. Since reduction in dose of lantus, the morning hypoglycemia has reduced.   Increased Novolog to 8+1 scale with Breakfast and supper. She will follow this scale at lunch time, if she is eating her meal then.   Copy of scale given to patient and she voiced understanding.  Continue current Lantus 30 units qam.  Check sugars four times daily. Report back if continues to have problems with high or low sugars.  A1c at this visit.   RTC 1 month.

## 2014-04-20 NOTE — Assessment & Plan Note (Signed)
GFR in Stage 3 range in June 2015. Update with next visit with PCP/me.

## 2014-04-20 NOTE — Progress Notes (Signed)
REASON FOR VISIT- [name] [name] is a 80 y.o.-year-old female, here for follow up management of Type 2 Diabetes Mellitus, uncontrolled, with complications ( diagnosed in 2009, on insulin therapy since 2011 with basal/bolus insulin therapy and variable FS readings due to in part medication noncompliance in the past; complications of  CAD s/p MI, PVD, Stage 3 CKD, Peripheral neuropathy) . Last seen by me about 1 month ago.  Reports that she is feeling better and is without cough. She reports that she was following the scale given to her by me, however started to notice some hypoglycemia, and somebody called her over the phone and gave her a different Novolog scale for use. She has been following that instead.     Currently taking  -Lantus  30 Units am ~9am, not missing any doses. Was on 32 units at last visit.  - Novolog with each meal. Now is taking 4+1 scale with each meal.  Now taking prior to meals about 10-15 minutes. Rotating injection sites.   Last hemoglobin A1c is- Lab Results  Component Value Date   HGBA1C 10.9* 12/30/2013    Patient checks her sugars 3 times daily with a  [employer]  glucometer. By log book  her sugars are-  PREMEAL Breakfast Lunch Dinner Bedtime Overall  Glucose range: 103-249 303-498 258 331-590   Mean/median:        Hypoglycemia-  Had mild hypoglycemia in the 50-70 range in the prior week--hence lantus was decreased.    she has hypoglycemia awareness at 60/65. No recent  hypoglycemia admissions.  Hyperglycemia- Highest sugar was " hi" recently  Dietary Habits- Eats two-three times daily. Tries to limit carbs, sodas, desserts. Hardly eats lunch. Eats late BF. Exercise- No formal exercise. Self sufficient with ADLs, lives with son/ Weight- Weight has been generally stable recently.  Wt Readings from Last 3 Encounters:  04/20/14 164 lb 12 oz (74.73 kg)  03/09/14 164 lb 4 oz (74.503 kg)  03/08/14 165 lb 4 oz (74.957 kg)    Diabetes Complications- Has known  complications Nephropathy-- Yes  CKD, Stage 3 with GFR in 54 range, last BUN/creatinine- Lab Results  Component Value Date   BUN 21 12/30/2013   CREATININE 1.0 12/30/2013    Retinopathy- No,  Last DEE was earlier in 2015. Neuropathy- Yes, Has symptoms of tingling and numbness in her feet. Regular self foot care.  Associated history - has CAD, PVD . No prior stroke. Has hyperlipidemia, but intolerant to statins. Has hypothyroidism. her last set of lipids were-  Lab Results  Component Value Date   CHOL 307* 12/30/2013   HDL 123.40 12/30/2013   LDLCALC 165* 12/30/2013   LDLDIRECT 138.1 06/20/2013   TRIG 92.0 12/30/2013   CHOLHDL 2 12/30/2013     I have reviewed the patient's past medical history, medications and allergies.  Outpatient Encounter Prescriptions as of 04/20/2014  Medication Sig  . ALPRAZolam (XANAX) 0.5 MG tablet Take 1 tablet (0.5 mg total) by mouth 2 (two) times daily as needed for anxiety.  Marland Kitchen BAYER BREEZE 2 TEST DISK TEST SIX TIMES A DAY  . Blood Glucose Monitoring Suppl (PRODIGY AUTOCODE BLOOD GLUCOSE) DEVI Use as directed 4 to 6 times daily Dx: 250.02  . glucose blood (PRODIGY AUTOCODE TEST) test strip Use as instructed  . insulin aspart (NOVOLOG FLEXPEN) 100 UNIT/ML FlexPen Take on a sliding scale between 8-16 units with each meal. Max daily dose 60 units.  . Insulin Glargine (LANTUS) 100 UNIT/ML Solostar Pen Inject 30 Units  into the skin daily.  Marland Kitchen levothyroxine (SYNTHROID, LEVOTHROID) 88 MCG tablet Take 1 tablet (88 mcg total) by mouth daily.  . pantoprazole (PROTONIX) 40 MG tablet TAKE 1 TABLET BY MOUTH TWICE DAILY  . [DISCONTINUED] Insulin Glargine (LANTUS SOLOSTAR) 100 UNIT/ML Solostar Pen Inject 32 Units into the skin daily.  . [DISCONTINUED] azithromycin (ZITHROMAX) 250 MG tablet Start 2 tablets today then 1 tablet daily for 4 days.      REVIEW OF SYSTEMS- [ x ]  Complains of    [  ]  denies [  ] Recent weight change [  ]  Fatigue [  ] polydipsia [  ] polyuria [  ]   nocturia [  ]  vision difficulty [  ] chest pain [  ] shortness of breath [  ] leg swelling [  ] cough [  ] nausea/vomiting [  ] diarrhea [  ] constipation [  ] abdominal pain [  ]  tingling/numbness in extremities [  ]  concern with feet ( wounds/sores)     PHYSICAL EXAM- BP 100/60  Pulse 88  Resp 14  Ht 5\' 5"  (1.651 m)  Wt 164 lb 12 oz (74.73 kg)  BMI 27.42 kg/m2  SpO2 94% Wt Readings from Last 3 Encounters:  04/20/14 164 lb 12 oz (74.73 kg)  03/09/14 164 lb 4 oz (74.503 kg)  03/08/14 165 lb 4 oz (74.957 kg)   Exam: deferred     ASSESSMENT/PLAN- 1. Diabetes mellitus uncontrolled 2. Long term use of insulin 3. Stage 3 CKD  Problem List Items Addressed This Visit     Endocrine   Type 2 diabetes mellitus with renal manifestations not at goal - Primary     BS values are now consistently elevated in the day. Morning readings are closer to goal. Since reduction in dose of lantus, the morning hypoglycemia has reduced.   Increased Novolog to 8+1 scale with Breakfast and supper. She will follow this scale at lunch time, if she is eating her meal then.   Copy of scale given to patient and she voiced understanding.  Continue current Lantus 30 units qam.  Check sugars four times daily. Report back if continues to have problems with high or low sugars.  A1c at this visit.   RTC 1 month.        Relevant Medications      Insulin Glargine (LANTUS) 100 UNIT/ML Solostar Pen   Other Relevant Orders      HgB A1c     Genitourinary   CKD stage 3 due to type 2 diabetes mellitus      GFR in Stage 3 range in June 2015. Update with next visit with PCP/me.     Relevant Medications      Insulin Glargine (LANTUS) 100 UNIT/ML Solostar Pen     Other   Long term current use of insulin     Discussed proper use of Novolog and Lantus, and the insulin plan in detail. Hypoglycemia recognition and correction was discussed again at this visit.      25 minutes spent with the patient, >  50% time spent on topics detailed above.  Tsuneo Faison PUSHKAR 04/20/2014, 4:46 PM

## 2014-04-20 NOTE — Assessment & Plan Note (Signed)
Discussed proper use of Novolog and Lantus, and the insulin plan in detail. Hypoglycemia recognition and correction was discussed again at this visit.

## 2014-04-20 NOTE — Patient Instructions (Signed)
Check sugars 4 x daily ( before each meal and at bedtime).  Record them in a log book and bring that/meter to next appointment.   Continue lantus 30 units daily at bedtime Change Novolog scale as below-    Blood Glucose (mg/dL)  Breakfast  (Units Novolog Insulin)  Lunch  (Units Novolog Insulin)  Supper  (Units Novolog Insulin)  Nighttime (Units Novolog Insulin)   <70 Treat the low blood sugar.  Recheck blood glucose  in 15 mins. If sugar is more than 70, then take the number of units of insulin in the 70-90 row, if before a meal.   70-90   5   5   5   0  91-130 (Base Dose)  8  8  8   0   131-150  9  9  9   0   151-200  10  10  10   0   201-250  11  11  11  0   251-300  12  12  12 1    301-350  13  13  13 2    351-400  14  14  14  3    401-450  15  15  15 4    >450  16  16  16  5    Lantus insulin 30 units subcutaneous Nightly

## 2014-04-21 LAB — HEMOGLOBIN A1C: Hgb A1c MFr Bld: 10.2 % — ABNORMAL HIGH (ref 4.6–6.5)

## 2014-04-26 ENCOUNTER — Encounter: Payer: Self-pay | Admitting: *Deleted

## 2014-04-26 NOTE — Progress Notes (Signed)
She seems to have a wide variation in her sugars.   Please call her and ask her to do the following- 1. don't skip meals  2. If she is having a lower sugars <70 premeal, follow the correction schedule for the low sugars and after sugar corrects to normal range,  use her SS if she is going to be eating her meal and this will cause a rise in her sugars.  3. Decrease lantus to 28 units. This will cut back on morning time hypoglycemia.  4. What is having for lunch and pm snack? Seems like her dinner numbers are quite high possibly due to high carb intake. Please ask her to decrease starchy foods and sugary drinks etc.  5. If she skips a meal, please make sure that skips that particular shot of mealtime insulin.   Fax sugars to me in 1-2 weeks after making these changes please.  thanks

## 2014-04-27 ENCOUNTER — Telehealth: Payer: Self-pay | Admitting: *Deleted

## 2014-04-27 ENCOUNTER — Encounter: Payer: Self-pay | Admitting: *Deleted

## 2014-04-27 NOTE — Telephone Encounter (Signed)
Can you please ask her not to skip meals. Skipping meals leads to prolonged periods of starvation, which leads to release of stored glucose from the body which could raise the premeal sugar. If she eats regularly, then perhaps her premeal sugars would not be that high and her mealtime insulin need would be perhaps lower.  Due to episode of hypoglycemia yesterday -lets decrease Novolog to 6+1 scale ( 2 units less all across the scale)   Blood Glucose (mg/dL)  Breakfast  (Units Novolog Insulin)  Lunch  (Units Novolog Insulin)  Supper  (Units Novolog Insulin)  Nighttime (Units Novolog Insulin)   <70 Treat the low blood sugar.  Recheck blood glucose  in 15 mins. If sugar is more than 70, then take the number of units of insulin in the 70-90 row, if before a meal.   70-90   4   4   4   0  91-130 (Base Dose)  6  6  6   0   131-150  7  7  7   0   151-200  8  8  8   0   201-250  9  9  9  0   251-300  10  10  10 1    301-350  11  11  11 2    351-400  12  12  12  3    401-450  13  13  13 4    >450  14  14  14  5    Lantus insulin 30 units subcutaneous Morning

## 2014-04-27 NOTE — Telephone Encounter (Signed)
Spoke with patient regarding Dr. Boyd Kerbs newest instructions. Pt concerned with her sliding scale and Novolog dosages. States she does not eat between breakfast and dinner, denies eating any snacks or drinking any drinks with sugar.States last night before dinner, she was 299. Took 12 units of Humalog per SS, ate baked chicken and could not remember what else. 2 hours later she was 54 and symptomatic. Ate OJ and PB cracker, returned to 168. Then 96 this AM. Confirmed patient's current dose of Lantus, states she has been taking this in the morning rather than in the evening.

## 2014-04-28 NOTE — Telephone Encounter (Addendum)
Left message for pt to return my call. New slicing scale and instructions were also mailed this morning in case I could not reach patient prior to the weekend.

## 2014-05-01 NOTE — Telephone Encounter (Signed)
Spoke with pt, received instructions over the weekend in the mail. No further questions at this time.

## 2014-05-05 ENCOUNTER — Other Ambulatory Visit: Payer: Self-pay | Admitting: Internal Medicine

## 2014-05-05 ENCOUNTER — Other Ambulatory Visit (INDEPENDENT_AMBULATORY_CARE_PROVIDER_SITE_OTHER): Payer: Commercial Managed Care - HMO

## 2014-05-05 ENCOUNTER — Telehealth: Payer: Self-pay | Admitting: Internal Medicine

## 2014-05-05 ENCOUNTER — Ambulatory Visit: Payer: Commercial Managed Care - HMO | Admitting: Internal Medicine

## 2014-05-05 ENCOUNTER — Ambulatory Visit (INDEPENDENT_AMBULATORY_CARE_PROVIDER_SITE_OTHER): Payer: Commercial Managed Care - HMO | Admitting: *Deleted

## 2014-05-05 VITALS — BP 108/62 | HR 79 | Temp 97.6°F | Resp 14

## 2014-05-05 DIAGNOSIS — R5383 Other fatigue: Secondary | ICD-10-CM

## 2014-05-05 DIAGNOSIS — R531 Weakness: Secondary | ICD-10-CM

## 2014-05-05 LAB — CBC WITH DIFFERENTIAL/PLATELET
BASOS PCT: 0.7 % (ref 0.0–3.0)
Basophils Absolute: 0 10*3/uL (ref 0.0–0.1)
Eosinophils Absolute: 0.3 10*3/uL (ref 0.0–0.7)
Eosinophils Relative: 6 % — ABNORMAL HIGH (ref 0.0–5.0)
HCT: 37.2 % (ref 36.0–46.0)
HEMOGLOBIN: 12.1 g/dL (ref 12.0–15.0)
Lymphocytes Relative: 27.6 % (ref 12.0–46.0)
Lymphs Abs: 1.6 10*3/uL (ref 0.7–4.0)
MCHC: 32.6 g/dL (ref 30.0–36.0)
MCV: 96.2 fl (ref 78.0–100.0)
MONO ABS: 0.3 10*3/uL (ref 0.1–1.0)
Monocytes Relative: 5.4 % (ref 3.0–12.0)
NEUTROS ABS: 3.5 10*3/uL (ref 1.4–7.7)
Neutrophils Relative %: 60.3 % (ref 43.0–77.0)
Platelets: 283 10*3/uL (ref 150.0–400.0)
RBC: 3.86 Mil/uL — AB (ref 3.87–5.11)
RDW: 14 % (ref 11.5–15.5)
WBC: 5.8 10*3/uL (ref 4.0–10.5)

## 2014-05-05 LAB — COMPREHENSIVE METABOLIC PANEL
ALK PHOS: 103 U/L (ref 39–117)
ALT: 25 U/L (ref 0–35)
AST: 26 U/L (ref 0–37)
Albumin: 3.3 g/dL — ABNORMAL LOW (ref 3.5–5.2)
BILIRUBIN TOTAL: 0.6 mg/dL (ref 0.2–1.2)
BUN: 19 mg/dL (ref 6–23)
CO2: 27 mEq/L (ref 19–32)
CREATININE: 1 mg/dL (ref 0.4–1.2)
Calcium: 9 mg/dL (ref 8.4–10.5)
Chloride: 100 mEq/L (ref 96–112)
GFR: 54.17 mL/min — ABNORMAL LOW (ref >60.00)
GLUCOSE: 333 mg/dL — AB (ref 70–99)
Potassium: 4.6 mEq/L (ref 3.5–5.1)
Sodium: 132 mEq/L — ABNORMAL LOW (ref 135–145)
Total Protein: 7.1 g/dL (ref 6.0–8.3)

## 2014-05-05 LAB — TSH: TSH: 3.79 u[IU]/mL (ref 0.35–4.50)

## 2014-05-05 LAB — GLUCOSE, POCT (MANUAL RESULT ENTRY): POC Glucose: 388 mg/dl — AB (ref 70–99)

## 2014-05-05 NOTE — Telephone Encounter (Signed)
Fine to cancel off schedule.

## 2014-05-05 NOTE — Telephone Encounter (Signed)
Patient Information:  Caller Name: [name]  Phone: [phone]  Patient: [name] [name]  Gender: Female  DOB: 11-05-1931  Age: 80 Years  PCP: Ronette Deter (Adults only)  Office Follow Up:  Does the office need to follow up with this patient?: No  Instructions For The Office: N/A  RN Note:  Office called and made aware; instructed to send pt to the office now; pt to keep previously scheduled 10am appt; pt will comply with friend driving  Symptoms  Reason For Call & Symptoms: Pt is calling and states that her BGM is 105  at present time; feeling weak and shaking;  having peanut butter and graham crackers now; she has an appt today at 10:00am for a DM recheck;  asking to reschedule appt for later today; pt insisting that she is ok after she eats; pt is anxious; she states that she has been seeing double and shaking this morning; the lowest today her BGM was was 105; she states that she usually runs 60-70;  she is by herself now;  insisting that she will be fine and became very anxious when she thought we were going to send someone to her house; c/o feeling dizzy and can not get up to walk because of the dizziness; son is now at the house with pt; pt now states that she is feeling better;   son doesn't drive; found a friend to take to the office  Reviewed Health History In EMR: Yes  Reviewed Medications In EMR: Yes  Reviewed Allergies In EMR: Yes  Reviewed Surgeries / Procedures: Yes  Date of Onset of Symptoms: 05/05/2014  Treatments Tried: drinking juice  Treatments Tried Worked: No  Guideline(s) Used:  Dizziness  Disposition Per Guideline:   Go to ED Now (or to Office with PCP Approval)  Reason For Disposition Reached:   Severe dizziness (e.g., unable to stand, requires support to walk, feels like passing out now)  Advice Given:  Call Back If:  Passes out (faints)  You become worse.  Patient Will Follow Care Advice:  YES  Appointment Scheduled:  05/05/2014  10:00:00 Appointment Scheduled Provider:  Ronette Deter (Adults only)

## 2014-05-05 NOTE — Progress Notes (Signed)
Pt presented to office late to scheduled appt. She had called [facility] prior to her appt that she had woken up with dizziness, weakness and fatigue. States blood sugar was 105. She ate breakfast of bacon, egg, cheese biscuit and PB graham cracker. States double vision when trying to read the paper. Here in office, dizziness and blurred vision had resolved. States weakness and fatigue, but no more than her usual. CBG in office 388. Has not taken any insulin today. Vital signs stable. Dr. Gilford Rile made aware. Labs drawn. Follow up appt scheduled with Dr. Gilford Rile 05/10/14. Advised to call back with persistent or worsening symptoms. Verbalized understanding.

## 2014-05-05 NOTE — Telephone Encounter (Signed)
Pt called to cancel appt due to no transportation. please advise to cancel off schedule.msn

## 2014-05-05 NOTE — Telephone Encounter (Signed)
Please advise 

## 2014-05-05 NOTE — Telephone Encounter (Signed)
PT has appt today.

## 2014-05-10 ENCOUNTER — Ambulatory Visit: Payer: Commercial Managed Care - HMO | Admitting: Internal Medicine

## 2014-05-10 DIAGNOSIS — Z0289 Encounter for other administrative examinations: Secondary | ICD-10-CM

## 2014-05-23 ENCOUNTER — Encounter: Payer: Self-pay | Admitting: Endocrinology

## 2014-05-23 ENCOUNTER — Ambulatory Visit (INDEPENDENT_AMBULATORY_CARE_PROVIDER_SITE_OTHER): Payer: Commercial Managed Care - HMO | Admitting: Endocrinology

## 2014-05-23 VITALS — BP 98/62 | HR 88 | Wt 168.0 lb

## 2014-05-23 DIAGNOSIS — E1129 Type 2 diabetes mellitus with other diabetic kidney complication: Secondary | ICD-10-CM

## 2014-05-23 DIAGNOSIS — N183 Chronic kidney disease, stage 3 (moderate): Secondary | ICD-10-CM

## 2014-05-23 DIAGNOSIS — E1122 Type 2 diabetes mellitus with diabetic chronic kidney disease: Secondary | ICD-10-CM

## 2014-05-23 DIAGNOSIS — Z794 Long term (current) use of insulin: Secondary | ICD-10-CM

## 2014-05-23 NOTE — Assessment & Plan Note (Signed)
Discussed proper use of Novolog and Lantus, and the insulin plan in detail. Hypoglycemia recognition and correction was discussed again at prior visit.

## 2014-05-23 NOTE — Progress Notes (Signed)
REASON FOR VISIT- [name] [name] is a 80 y.o.-year-old female, here for follow up management of Type 2 Diabetes Mellitus, uncontrolled, with complications ( diagnosed in 2009, on insulin therapy since 2011 with basal/bolus insulin therapy and variable FS readings due to in part medication noncompliance in the past; complications of  CAD s/p MI, PVD, Stage 3 CKD, Peripheral neuropathy) . Last seen by me about 1 month ago.  She reports working with a Marine scientist, who comes in once monthly to check up on her sugars.     Currently taking  -Lantus  26-28 Units am ~9am, not missing any doses. Was on 30 units at last visit. She is not sure why she is adjusting her insulin.  - Novolog with each meal. Now is taking 6+1 scale with each meal.  Now taking prior to meals about 10-15 minutes. Rotating injection sites. Taking it with Brunch and supper.   Last hemoglobin A1c is-  Lab Results  Component Value Date   HGBA1C 10.2* 04/20/2014   HGBA1C 10.9* 12/30/2013   HGBA1C 11.2* 09/27/2013     Patient checks her sugars 3 times daily with a  [employer]  glucometer. By log book  her sugars are-  PREMEAL Breakfast Lunch Dinner Bedtime Overall  Glucose range: 70-322 211-347 Variable  149-310 but also 363-Hi 310-534   Mean/median:        Hypoglycemia-  Had mild hypoglycemia in the 70 range occasionally during the day/morning   she has hypoglycemia awareness at 60/65. No recent  hypoglycemia admissions.  Hyperglycemia- Highest sugar was " hi" recently  Dietary Habits- Eats two-three times daily. Tries to limit carbs, sodas, desserts. Hardly eats lunch. Eats late BF. Exercise- No formal exercise. Self sufficient with ADLs, lives with son/ Weight- Weight has been generally stable recently.  Leg swelling recently.  Wt Readings from Last 3 Encounters:  05/23/14 168 lb (76.204 kg)  04/20/14 164 lb 12 oz (74.73 kg)  03/09/14 164 lb 4 oz (74.503 kg)    Diabetes Complications- Has known  complications Nephropathy-- Yes  CKD, Stage 3 with GFR in 54 range, last BUN/creatinine- Lab Results  Component Value Date   BUN 19 05/05/2014   CREATININE 1.0 05/05/2014    Lab Results  Component Value Date   GFR 54.17* 05/05/2014    Retinopathy- No,  Last DEE was earlier in 2015. Neuropathy- Yes, Has symptoms of tingling and numbness in her feet. Regular self foot care.  Associated history - has CAD, PVD . No prior stroke. Has hyperlipidemia, but intolerant to statins. Has hypothyroidism. her last set of lipids were-  Lab Results  Component Value Date   CHOL 307* 12/30/2013   HDL 123.40 12/30/2013   LDLCALC 165* 12/30/2013   LDLDIRECT 138.1 06/20/2013   TRIG 92.0 12/30/2013   CHOLHDL 2 12/30/2013     I have reviewed the patient's past medical history, medications and allergies.  Outpatient Encounter Prescriptions as of 05/23/2014  Medication Sig  . ALPRAZolam (XANAX) 0.5 MG tablet Take 1 tablet (0.5 mg total) by mouth 2 (two) times daily as needed for anxiety.  Marland Kitchen BAYER BREEZE 2 TEST DISK TEST SIX TIMES A DAY  . Blood Glucose Monitoring Suppl (PRODIGY AUTOCODE BLOOD GLUCOSE) DEVI Use as directed 4 to 6 times daily Dx: 250.02  . glucose blood (PRODIGY AUTOCODE TEST) test strip Use as instructed  . insulin aspart (NOVOLOG FLEXPEN) 100 UNIT/ML FlexPen Take on a sliding scale between 8-16 units with each meal. Max daily dose 60 units.  Marland Kitchen  Insulin Glargine (LANTUS) 100 UNIT/ML Solostar Pen Inject 30 Units into the skin daily.  Marland Kitchen levothyroxine (SYNTHROID, LEVOTHROID) 88 MCG tablet Take 1 tablet (88 mcg total) by mouth daily.  . pantoprazole (PROTONIX) 40 MG tablet TAKE 1 TABLET BY MOUTH TWICE DAILY      REVIEW OF SYSTEMS- [ x ]  Complains of    [  ]  denies [  ] Recent weight change [  ]  Fatigue [  ] polydipsia [  ] polyuria [  ]  nocturia [  ]  vision difficulty [  ] chest pain [  ] shortness of breath [ x ] leg swelling [  ] cough [  ] nausea/vomiting [  ] diarrhea [  ] constipation [  ]  abdominal pain [  ]  tingling/numbness in extremities [  ]  concern with feet ( wounds/sores) Occasionally reporting periods of significant fatigue and generalized weakness.      PHYSICAL EXAM- BP 98/62  Pulse 88  Wt 168 lb (76.204 kg)  SpO2 96% Wt Readings from Last 3 Encounters:  05/23/14 168 lb (76.204 kg)  04/20/14 164 lb 12 oz (74.73 kg)  03/09/14 164 lb 4 oz (74.503 kg)   Exam: deferred     ASSESSMENT/PLAN- 1. Diabetes mellitus uncontrolled 2. Long term use of insulin 3. Stage 3 CKD  Problem List Items Addressed This Visit     Endocrine   Type 2 diabetes mellitus with renal manifestations not at goal - Primary     BS sugars are very variable, but mostly elevated. A1c is above target.  Not sure whether she is titrating the lantus dose. Asked her to take fixed dose at 28 units daily. Consistent elevated readings during the earlier part of the day, but evening sugars variable. Suspect that she is snacking prior to checking her sugars.  Asked her to stay hydrated to see if symptoms of episodic fatigue improve.  Will adjust novolog to 9+1 with BF, 6+1 Lunch ( if she eats then), 7+1 with supper.  Check sugars 4 x daily.  Notify if lows.   RTC 1 month      Genitourinary   CKD stage 3 due to type 2 diabetes mellitus      GFR in Stage 3 range in October 2015.         Other   Long term current use of insulin     Discussed proper use of Novolog and Lantus, and the insulin plan in detail. Hypoglycemia recognition and correction was discussed again at prior visit.          [name] [name] 05/23/2014, 4:57 PM

## 2014-05-23 NOTE — Progress Notes (Signed)
Pre visit review using our clinic review tool, if applicable. No additional management support is needed unless otherwise documented below in the visit note. 

## 2014-05-23 NOTE — Patient Instructions (Signed)
Check sugars 4 x daily ( before each meal and at bedtime).  Record them in a log book and bring that/meter to next appointment.    Blood Glucose (mg/dL)  Breakfast  (Units Novolog Insulin)  Lunch  (Units Novolog Insulin)  Supper  (Units Novolog Insulin)  Nighttime (Units Novolog Insulin)   <70 Treat the low blood sugar.  Recheck blood glucose  in 15 mins. If sugar is more than 70, then take the number of units of insulin in the 70-90 row, if before a meal.   70-90   5   4   4   0  91-130 (Base Dose)  9  6  7   0   131-150  10  7  8   0   151-200  11  8  9   0   201-250  12  9  10  0   251-300  13  10  11 1    301-350  14  11  12 2    351-400  15  12  13  3    401-450  16  13  14 4    >450  17  14  15  5    Lantus insulin 28 units subcutaneous Morning     Please come back for a follow-up appointment in 1 month.

## 2014-05-23 NOTE — Assessment & Plan Note (Signed)
GFR in Stage 3 range in October 2015.          

## 2014-05-23 NOTE — Assessment & Plan Note (Addendum)
BS sugars are very variable, but mostly elevated. A1c is above target.  Not sure whether she is titrating the lantus dose. Asked her to take fixed dose at 28 units daily. Consistent elevated readings during the earlier part of the day, but evening sugars variable. Suspect that she is snacking prior to checking her sugars.  Asked her to stay hydrated to see if symptoms of episodic fatigue improve.  Will adjust novolog to 9+1 with BF, 6+1 Lunch ( if she eats then), 7+1 with supper.  Check sugars 4 x daily.  Notify if lows.   RTC 1 month

## 2014-05-26 ENCOUNTER — Ambulatory Visit: Payer: Commercial Managed Care - HMO | Admitting: Internal Medicine

## 2014-05-29 ENCOUNTER — Encounter: Payer: Self-pay | Admitting: Endocrinology

## 2014-06-01 ENCOUNTER — Other Ambulatory Visit: Payer: Self-pay | Admitting: *Deleted

## 2014-06-01 MED ORDER — PANTOPRAZOLE SODIUM 40 MG PO TBEC: DELAYED_RELEASE_TABLET | ORAL | Status: DC

## 2014-06-20 ENCOUNTER — Encounter: Payer: Self-pay | Admitting: Endocrinology

## 2014-06-20 ENCOUNTER — Ambulatory Visit (INDEPENDENT_AMBULATORY_CARE_PROVIDER_SITE_OTHER): Payer: Commercial Managed Care - HMO | Admitting: Endocrinology

## 2014-06-20 VITALS — BP 118/70 | HR 79 | Ht 65.0 in | Wt 168.0 lb

## 2014-06-20 DIAGNOSIS — E1122 Type 2 diabetes mellitus with diabetic chronic kidney disease: Secondary | ICD-10-CM

## 2014-06-20 DIAGNOSIS — E1129 Type 2 diabetes mellitus with other diabetic kidney complication: Secondary | ICD-10-CM

## 2014-06-20 DIAGNOSIS — Z794 Long term (current) use of insulin: Secondary | ICD-10-CM

## 2014-06-20 DIAGNOSIS — N183 Chronic kidney disease, stage 3 unspecified: Secondary | ICD-10-CM

## 2014-06-20 NOTE — Progress Notes (Signed)
REASON FOR VISIT- [name] [name] is a 80 y.o.-year-old female, here for follow up management of Type 2 Diabetes Mellitus, uncontrolled, with complications ( diagnosed in 2009, on insulin therapy since 2011 with basal/bolus insulin therapy and variable FS readings due to in part medication noncompliance in the past; complications of  CAD s/p MI, PVD, Stage 3 CKD, Peripheral neuropathy) . Last seen by me about 1 month ago.  She reports working with a Marine scientist, who comes in once monthly to check up on her sugars.  Reports that levemir might be the preferred drug option for next year per her plan.     Currently taking  -Lantus  28 Units am ~9am, not missing any doses. Was on 30 units at last visits. She was adjusting her lantus last visit, now not doing that anymore.   - Novolog with each meal. Now is taking 8 units with each meal. Is supposed to be on 9+1 with BF, 6+1 lunch( if she eats then) and 7+1 with supper.   Now taking prior to meals about 10-15 minutes. Rotating injection sites. Taking it with Brunch and supper usually, but reports that she doesn't understand the scale and has been using it only sometimes.   Last hemoglobin A1c is-  Lab Results  Component Value Date   HGBA1C 10.2* 04/20/2014   HGBA1C 10.9* 12/30/2013   HGBA1C 11.2* 09/27/2013     Patient checks her sugars 3 times daily with a  [employer]  glucometer. By log book  her sugars are-  PREMEAL Breakfast Lunch Dinner Bedtime Overall  Glucose range: 85-214 49-442 151 213-374   Post meals 66-275 192-318      Hypoglycemia-  Had mild-moderate hypoglycemia in the 49-70 range occasionally during the day/morning   she has hypoglycemia awareness at 60/65. No recent  hypoglycemia admissions.    Dietary Habits- Eats two-three times daily. Tries to limit carbs, sodas, desserts. Hardly eats lunch. Eats late BF. Exercise- No formal exercise. Self sufficient with ADLs, lives with son/ Weight- Weight has been generally stable  recently.  Leg swelling recently.  Wt Readings from Last 3 Encounters:  06/20/14 168 lb (76.204 kg)  05/23/14 168 lb (76.204 kg)  04/20/14 164 lb 12 oz (74.73 kg)    Diabetes Complications- Has known complications Nephropathy-- Yes  CKD, Stage 3 with GFR in 54 range, last BUN/creatinine- Lab Results  Component Value Date   BUN 19 05/05/2014   CREATININE 1.0 05/05/2014    Lab Results  Component Value Date   GFR 54.17* 05/05/2014   Lab Results  Component Value Date   MICRALBCREAT 1.7 09/27/2013    Retinopathy- No,  Last DEE was earlier in 2015. Neuropathy- Yes, Has symptoms of tingling and numbness in her feet. Regular self foot care.  Associated history - has CAD, PVD . No prior stroke. Has hyperlipidemia, but intolerant to statins. Has hypothyroidism. her last set of lipids were-  Lab Results  Component Value Date   CHOL 307* 12/30/2013   HDL 123.40 12/30/2013   LDLCALC 165* 12/30/2013   LDLDIRECT 138.1 06/20/2013   TRIG 92.0 12/30/2013   CHOLHDL 2 12/30/2013     I have reviewed the patient's past medical history, medications and allergies.  Outpatient Encounter Prescriptions as of 06/20/2014  Medication Sig  . ALPRAZolam (XANAX) 0.5 MG tablet Take 1 tablet (0.5 mg total) by mouth 2 (two) times daily as needed for anxiety.  Marland Kitchen BAYER BREEZE 2 TEST DISK TEST SIX TIMES A DAY  . Blood Glucose  Monitoring Suppl (PRODIGY AUTOCODE BLOOD GLUCOSE) DEVI Use as directed 4 to 6 times daily Dx: 250.02  . glucose blood (PRODIGY AUTOCODE TEST) test strip Use as instructed  . insulin aspart (NOVOLOG FLEXPEN) 100 UNIT/ML FlexPen Take on a sliding scale between 8-16 units with each meal. Max daily dose 60 units.  . Insulin Glargine (LANTUS) 100 UNIT/ML Solostar Pen Inject 30 Units into the skin daily.  Marland Kitchen levothyroxine (SYNTHROID, LEVOTHROID) 88 MCG tablet Take 1 tablet (88 mcg total) by mouth daily.  . pantoprazole (PROTONIX) 40 MG tablet TAKE 1 TABLET BY MOUTH TWICE DAILY      REVIEW  OF SYSTEMS- [ x ]  Complains of    [  ]  denies [  ] Recent weight change [  ]  Fatigue [  ] polydipsia [  ] polyuria [  ]  nocturia [  ]  vision difficulty [  ] chest pain [  ] shortness of breath [ x ] leg swelling [  ] cough [  ] nausea/vomiting [  ] diarrhea [  ] constipation [  ] abdominal pain [ x ]  tingling/numbness in extremities [ x ]  concern with feet ( wounds/sores)     PHYSICAL EXAM- BP 118/70 mmHg  Pulse 79  Ht 5\' 5"  (1.651 m)  Wt 168 lb (76.204 kg)  BMI 27.96 kg/m2  SpO2 96% Wt Readings from Last 3 Encounters:  06/20/14 168 lb (76.204 kg)  05/23/14 168 lb (76.204 kg)  04/20/14 164 lb 12 oz (74.73 kg)   Exam: deferred     ASSESSMENT/PLAN- 1. Diabetes mellitus uncontrolled 2. Long term use of insulin 3. Stage 3 CKD  Problem List Items Addressed This Visit      Endocrine   Type 2 diabetes mellitus with renal manifestations not at goal - Primary    BS sugars are very variable, but mostly elevated. A1c is above target. I think this is related to her noncompliance with insulin use.  Asked her to continue current lantus at 28 units daily. We went over the sliding scale in detail and have asked her to follow this at each meal.  For now, will continue current novolog scale qac 9+1 with BF, 6+1 Lunch ( if she eats then), 7+1 with supper.   She has just picked up a refill for her insulins. We discussed about simplifying her regimen to pre mixed insulin and she seems agreeable to make this change from start of next year.   Check sugars 4 x daily.  Notify if lows.   RTC 2 months       Genitourinary   CKD stage 3 due to type 2 diabetes mellitus     GFR in Stage 3 range in October 2015.           Other   Long term current use of insulin    Now seems to be using her lantus properly. Still having a harder time with the sliding scale, however her sugars are quite variable without use of it. Went over the details of how to use a sliding scale in detail and she  expressed understanding.           25 minutes spent with the patient,> 50% time spent of insulin education and counseling on topics mentioned above.  RTC ~ 2 months   Leshay Desaulniers [facility] 06/21/2014, 10:46 AM

## 2014-06-20 NOTE — Progress Notes (Signed)
Pre visit review using our clinic review tool, if applicable. No additional management support is needed unless otherwise documented below in the visit note. 

## 2014-06-20 NOTE — Patient Instructions (Addendum)
Check sugars 4 x daily ( before each meal and at bedtime).  Record them in a log book and bring that/meter to next appointment.   Follow the scale given for Novolog at last visit.  Explained scale in detail.   At next visits - will consider change to pre mixed insulin to simplify regimen.   RTC 7 weeks

## 2014-06-21 NOTE — Assessment & Plan Note (Signed)
Now seems to be using her lantus properly. Still having a harder time with the sliding scale, however her sugars are quite variable without use of it. Went over the details of how to use a sliding scale in detail and she expressed understanding.

## 2014-06-21 NOTE — Assessment & Plan Note (Signed)
BS sugars are very variable, but mostly elevated. A1c is above target. I think this is related to her noncompliance with insulin use.  Asked her to continue current lantus at 28 units daily. We went over the sliding scale in detail and have asked her to follow this at each meal.  For now, will continue current novolog scale qac 9+1 with BF, 6+1 Lunch ( if she eats then), 7+1 with supper.   She has just picked up a refill for her insulins. We discussed about simplifying her regimen to pre mixed insulin and she seems agreeable to make this change from start of next year.   Check sugars 4 x daily.  Notify if lows.   RTC 2 months

## 2014-06-21 NOTE — Assessment & Plan Note (Signed)
GFR in Stage 3 range in October 2015.          

## 2014-08-08 ENCOUNTER — Encounter: Payer: Self-pay | Admitting: Endocrinology

## 2014-08-08 ENCOUNTER — Other Ambulatory Visit: Payer: Self-pay | Admitting: *Deleted

## 2014-08-08 ENCOUNTER — Ambulatory Visit: Payer: Commercial Managed Care - HMO | Admitting: Internal Medicine

## 2014-08-08 ENCOUNTER — Ambulatory Visit (INDEPENDENT_AMBULATORY_CARE_PROVIDER_SITE_OTHER): Payer: Medicare Other | Admitting: Endocrinology

## 2014-08-08 VITALS — BP 120/62 | HR 91 | Ht 65.0 in | Wt 164.2 lb

## 2014-08-08 DIAGNOSIS — E1122 Type 2 diabetes mellitus with diabetic chronic kidney disease: Secondary | ICD-10-CM

## 2014-08-08 DIAGNOSIS — Z794 Long term (current) use of insulin: Secondary | ICD-10-CM

## 2014-08-08 DIAGNOSIS — E785 Hyperlipidemia, unspecified: Secondary | ICD-10-CM

## 2014-08-08 DIAGNOSIS — N183 Chronic kidney disease, stage 3 (moderate): Secondary | ICD-10-CM

## 2014-08-08 DIAGNOSIS — E039 Hypothyroidism, unspecified: Secondary | ICD-10-CM

## 2014-08-08 DIAGNOSIS — E1129 Type 2 diabetes mellitus with other diabetic kidney complication: Secondary | ICD-10-CM

## 2014-08-08 MED ORDER — LEVOTHYROXINE SODIUM 88 MCG PO TABS
88.0000 ug | ORAL_TABLET | Freq: Every day | ORAL | Status: DC
Start: 2014-08-08 — End: 2015-03-16

## 2014-08-08 MED ORDER — INSULIN ASPART PROT & ASPART (70-30 MIX) 100 UNIT/ML PEN: PEN_INJECTOR | SUBCUTANEOUS | Status: DC

## 2014-08-08 NOTE — Assessment & Plan Note (Signed)
GFR in Stage 3 range in October 2015.

## 2014-08-08 NOTE — Progress Notes (Signed)
REASON FOR VISIT- [name] [name] is a 81 y.o.-year-old female, here for follow up management of Type 2 Diabetes Mellitus, uncontrolled, with complications ( diagnosed in 2009, on insulin therapy since 2011 with basal/bolus insulin therapy and variable FS readings due to in part medication noncompliance in the past; complications of  CAD s/p MI, PVD, Stage 3 CKD, Peripheral neuropathy) . Last seen by me about 6 weeks ago.  She reports working with a Marine scientist, who comes in once monthly to check up on her sugars.  Reports that levemir might be the preferred drug option for next year per her plan, but overall the insulins are very expensive and would like to try something else    Currently taking  -Lantus  28 Units am ~9am, not missing any doses.  -Novolog 8 units with each meal. Tried sliding scale last visits, but it was too complicated Asked her to take fixed dose Novolog but she continues to take 8 units instead of what is recommended.  Now taking prior to meals about 10-15 minutes. Rotating injection sites.   Last hemoglobin A1c is-  Lab Results  Component Value Date   HGBA1C 10.2* 04/20/2014   HGBA1C 10.9* 12/30/2013   HGBA1C 11.2* 09/27/2013     Patient checks her sugars 3 times daily with a  [employer]  glucometer. By log book  her sugars are-  Overall sugars are very variable. Few weeks ago, her morning sugars were mostly at goal, but this week they are very elevated for unclear reasons PREMEAL Breakfast Lunch Dinner Bedtime Overall  Glucose range: 56-425 86-474 298-514 142-552   Post meals 210-428       Hypoglycemia-  Had mild-moderate hypoglycemia in the 50-70 range occasionally during the day/morning   she has hypoglycemia awareness at 60/65. No recent  hypoglycemia admissions.    Dietary Habits- Eats two-three times daily. Tries to limit carbs, sodas, desserts. Hardly eats lunch. Eats late BF. Exercise- No formal exercise. Self sufficient with ADLs, lives with son/ Weight-  Weight has been generally stable recently.  Leg swelling recently.  Wt Readings from Last 3 Encounters:  08/08/14 164 lb 4 oz (74.503 kg)  06/20/14 168 lb (76.204 kg)  05/23/14 168 lb (76.204 kg)    Diabetes Complications- Has known complications Nephropathy-- Yes  CKD, Stage 3 with GFR in 54 range, last BUN/creatinine- Lab Results  Component Value Date   BUN 19 05/05/2014   CREATININE 1.0 05/05/2014    Lab Results  Component Value Date   GFR 54.17* 05/05/2014   Lab Results  Component Value Date   MICRALBCREAT 1.7 09/27/2013    Retinopathy- No,  Last DEE was earlier in 2015. Neuropathy- Yes, Has symptoms of tingling and numbness in her feet. Regular self foot care.  Associated history - has CAD, PVD . No prior stroke. Has hyperlipidemia, but intolerant to statins. Has hypothyroidism. her last set of lipids were-  Lab Results  Component Value Date   CHOL 307* 12/30/2013   HDL 123.40 12/30/2013   LDLCALC 165* 12/30/2013   LDLDIRECT 138.1 06/20/2013   TRIG 92.0 12/30/2013   CHOLHDL 2 12/30/2013     I have reviewed the patient's past medical history, medications and allergies.  Outpatient Encounter Prescriptions as of 08/08/2014  Medication Sig  . ALPRAZolam (XANAX) 0.5 MG tablet Take 1 tablet (0.5 mg total) by mouth 2 (two) times daily as needed for anxiety.  Marland Kitchen BAYER BREEZE 2 TEST DISK TEST SIX TIMES A DAY  . Blood Glucose Monitoring  Suppl (PRODIGY AUTOCODE BLOOD GLUCOSE) DEVI Use as directed 4 to 6 times daily Dx: 250.02  . glucose blood (PRODIGY AUTOCODE TEST) test strip Use as instructed  . pantoprazole (PROTONIX) 40 MG tablet TAKE 1 TABLET BY MOUTH TWICE DAILY  . [DISCONTINUED] insulin aspart (NOVOLOG FLEXPEN) 100 UNIT/ML FlexPen Take on a sliding scale between 8-16 units with each meal. Max daily dose 60 units.  . [DISCONTINUED] Insulin Glargine (LANTUS) 100 UNIT/ML Solostar Pen Inject 30 Units into the skin daily.  . [DISCONTINUED] levothyroxine (SYNTHROID,  LEVOTHROID) 88 MCG tablet Take 1 tablet (88 mcg total) by mouth daily.  . insulin aspart protamine - aspart (NOVOLOG MIX 70/30 FLEXPEN) (70-30) 100 UNIT/ML FlexPen Use 21 units twice daily ( with breakfast and supper).      REVIEW OF SYSTEMS- [ x ]  Complains of    [  ]  denies [  ] Recent weight change [ x ]  Fatigue [ x ] polydipsia [ x ] polyuria [  ]  nocturia [  ]  vision difficulty [  ] chest pain [  ] shortness of breath [  ] leg swelling [  ] cough [  ] nausea/vomiting [  ] diarrhea [  ] constipation [  ] abdominal pain [  ]  tingling/numbness in extremities [  ]  concern with feet ( wounds/sores)  Out of levothyroxine for about 2-3 weeks, requesting refill by PCP. Has appt with PCP next week.    PHYSICAL EXAM- BP 120/62 mmHg  Pulse 91  Ht 5\' 5"  (1.651 m)  Wt 164 lb 4 oz (74.503 kg)  BMI 27.33 kg/m2  SpO2 94% Wt Readings from Last 3 Encounters:  08/08/14 164 lb 4 oz (74.503 kg)  06/20/14 168 lb (76.204 kg)  05/23/14 168 lb (76.204 kg)   Exam: deferred     ASSESSMENT/PLAN- 1. Diabetes mellitus uncontrolled 2. Long term use of insulin 3. Stage 3 CKD  Problem List Items Addressed This Visit      Endocrine   Type 2 diabetes mellitus with renal manifestations not at goal - Primary    BS sugars are very variable, but mostly elevated. A1c is above target, update today.  She is finding the basal/bolus regimen difficult and would like to cut down costs.  Asked her to stop lantus and Novolog and switch to Novolog mix 70/30 at 21 units twice daily with BF and supper.    Check sugars 4 x daily.  Notify if lows.   RTC 1 months       Relevant Medications      insulin aspart protamine - aspart (NOVOLOG MIX 70/30 FLEXPEN) (70-30) 100 UNIT/ML FlexPen   Other Relevant Orders      Hemoglobin A1c     Genitourinary   CKD stage 3 due to type 2 diabetes mellitus     GFR in Stage 3 range in October 2015.           Relevant Medications      insulin aspart protamine  - aspart (NOVOLOG MIX 70/30 FLEXPEN) (70-30) 100 UNIT/ML FlexPen     Other   Hyperlipidemia with target LDL less than 70    Last lipids not controlled. Intolerant of statins. Continue to work on diet.     Long term current use of insulin    Cost now a concern for the patient. She wants to try premixed insulin and we went over how it works and the ways it is different from basal/bolus regimen.  25 minutes spent with the patient,> 50% time spent of insulin education and counseling on topics mentioned above.  RTC ~ 1 months Levothyroxine refill done by PCP office today.    Brynnley Dayrit PUSHKAR 08/08/2014, 4:04 PM

## 2014-08-08 NOTE — Assessment & Plan Note (Signed)
Last lipids not controlled. Intolerant of statins. Continue to work on diet.

## 2014-08-08 NOTE — Assessment & Plan Note (Signed)
Cost now a concern for the patient. She wants to try premixed insulin and we went over how it works and the ways it is different from basal/bolus regimen.

## 2014-08-08 NOTE — Assessment & Plan Note (Signed)
BS sugars are very variable, but mostly elevated. A1c is above target, update today.  She is finding the basal/bolus regimen difficult and would like to cut down costs.  Asked her to stop lantus and Novolog and switch to Novolog mix 70/30 at 21 units twice daily with BF and supper.    Check sugars 4 x daily.  Notify if lows.   RTC 1 months

## 2014-08-08 NOTE — Patient Instructions (Addendum)
Check sugars 4 x daily ( before each meal and at bedtime).  Record them in a log book and bring that/meter to next appointment.   Stop lantus and Novolog.  Switch to Novolog mix 70/30 at 21 units twice daily ( with breakfast and supper).   A1c lab today.  Follow up appt 4 weeks. Let me know if you have persistent high/low sugars after making this switch to new insulin.

## 2014-08-09 LAB — HEMOGLOBIN A1C: Hgb A1c MFr Bld: 11.8 % — ABNORMAL HIGH (ref 4.6–6.5)

## 2014-08-15 ENCOUNTER — Ambulatory Visit (INDEPENDENT_AMBULATORY_CARE_PROVIDER_SITE_OTHER): Payer: Medicare Other | Admitting: Internal Medicine

## 2014-08-15 ENCOUNTER — Telehealth: Payer: Self-pay

## 2014-08-15 ENCOUNTER — Encounter: Payer: Self-pay | Admitting: Internal Medicine

## 2014-08-15 VITALS — BP 103/66 | HR 82 | Temp 97.7°F | Ht 65.0 in | Wt 164.5 lb

## 2014-08-15 DIAGNOSIS — E785 Hyperlipidemia, unspecified: Secondary | ICD-10-CM

## 2014-08-15 DIAGNOSIS — E1165 Type 2 diabetes mellitus with hyperglycemia: Principal | ICD-10-CM

## 2014-08-15 DIAGNOSIS — R110 Nausea: Secondary | ICD-10-CM | POA: Insufficient documentation

## 2014-08-15 DIAGNOSIS — E1151 Type 2 diabetes mellitus with diabetic peripheral angiopathy without gangrene: Secondary | ICD-10-CM

## 2014-08-15 DIAGNOSIS — IMO0002 Reserved for concepts with insufficient information to code with codable children: Secondary | ICD-10-CM

## 2014-08-15 DIAGNOSIS — E1159 Type 2 diabetes mellitus with other circulatory complications: Secondary | ICD-10-CM

## 2014-08-15 MED ORDER — INSULIN ISOPHANE & REGULAR (HUMAN 70-30)100 UNIT/ML KWIKPEN: 21.0000 [IU] | PEN_INJECTOR | Freq: Two times a day (BID) | SUBCUTANEOUS | Status: DC

## 2014-08-15 NOTE — Progress Notes (Signed)
Subjective:    Patient ID: [name] [name], female    DOB: [birth year], 81 y.o.   MRN: [id number]  HPI  81YO female presents for follow up.  DM - Recently changed to Novolog 70/30 21 units twice daily with meals. Last A1c was 11.8%. [name] not yet received her new medication to change insulin. Plans to pick this up today. BG ranging 56-501 by her record. Feels generally poorly with low blood sugars.  Notes some intermittent mild abdominal pain. Comes and goes with some nausea, but not persistent. She reports this is rare. No diarrhea.At times, this may occur in setting of low BG.   Past medical, surgical, family and social history per today's encounter.  Review of Systems  Constitutional: Negative for fever, chills, appetite change, fatigue and unexpected weight change.  HENT: Positive for hearing loss.   Eyes: Negative for visual disturbance.  Respiratory: Negative for shortness of breath.   Cardiovascular: Negative for chest pain and leg swelling.  Gastrointestinal: Negative for abdominal pain.  Skin: Negative for color change and rash.  Hematological: Negative for adenopathy. Does not bruise/bleed easily.  Psychiatric/Behavioral: Negative for dysphoric mood. The patient is not nervous/anxious.        Objective:    BP 103/66 mmHg  Pulse 82  Temp(Src) 97.7 F (36.5 C) (Oral)  Ht 5\' 5"  (1.651 m)  Wt 164 lb 8 oz (74.617 kg)  BMI 27.37 kg/m2  SpO2 96% Physical Exam  Constitutional: She is oriented to person, place, and time. She appears well-developed and well-nourished. No distress.  HENT:  Head: Normocephalic and atraumatic.  Right Ear: External ear normal.  Left Ear: External ear normal.  Nose: Nose normal.  Mouth/Throat: Oropharynx is clear and moist. No oropharyngeal exudate.  Eyes: Conjunctivae are normal. Pupils are equal, round, and reactive to light. Right eye exhibits no discharge. Left eye exhibits no discharge. No scleral icterus.  Neck: Normal range of  motion. Neck supple. No tracheal deviation present. No thyromegaly present.  Cardiovascular: Normal rate, regular rhythm, normal heart sounds and intact distal pulses.  Exam reveals no gallop and no friction rub.   No murmur heard. Pulmonary/Chest: Effort normal and breath sounds normal. No accessory muscle usage. No tachypnea. No respiratory distress. [name] no decreased breath sounds. [name] no wheezes. [name] no rhonchi. [name] no rales. She exhibits no tenderness.  Musculoskeletal: Normal range of motion. She exhibits no edema or tenderness.  Lymphadenopathy:    [name] no cervical adenopathy.  Neurological: She is alert and oriented to person, place, and time. No cranial nerve deficit. She exhibits normal muscle tone. Coordination normal.  Skin: Skin is warm and dry. No rash noted. She is not diaphoretic. No erythema. No pallor.  Psychiatric: [name] a normal mood and affect. Her behavior is normal. Judgment and thought content normal.          Assessment & Plan:   Problem List Items Addressed This Visit      Unprioritized   DM (diabetes mellitus) type II uncontrolled, periph vascular disorder - Primary    Lab Results  Component Value Date   HGBA1C 11.8* 08/08/2014   BG poorly controlled. Will start Humalin 70-30 as per Dr. Boyd Kerbs instruction. Encouraged her to start this medication and continue to record BG. Follow up with Dr. Howell Rucks in 08/2014      Relevant Medications   Insulin Isophane & Regular Human (HUMULIN 70/30 PEN) (70-30) 100 UNIT/ML PEN   Hyperlipidemia with target LDL  less than 70    Lab Results  Component Value Date   LDLCALC 165* 12/30/2013   Lipids above goal. Pt intolerant to statins. Encouraged compliance with healthy diet.      Nausea without vomiting    Intermittent mild nausea, likely related to hypoglycemia. Will monitor BG carefully and check if any symptoms of nausea, diaphoresis. Treat BG<70 with drink such as orange juice. No persistent  abdominal pain. If any change in symptoms, worsening or persistent abdominal pain or NV, then pt will RTC for evaluation.          Return in about 3 months (around 11/14/2014) for Recheck.

## 2014-08-15 NOTE — Telephone Encounter (Signed)
Received a drug change request from [facility] stating that patient insurance no longer covers Novolog mix 70/30. Pharmacy would like to change it to Humalog. Please advise.

## 2014-08-15 NOTE — Assessment & Plan Note (Signed)
Intermittent mild nausea, likely related to hypoglycemia. Will monitor BG carefully and check if any symptoms of nausea, diaphoresis. Treat BG<70 with drink such as orange juice. No persistent abdominal pain. If any change in symptoms, worsening or persistent abdominal pain or NV, then pt will RTC for evaluation.

## 2014-08-15 NOTE — Assessment & Plan Note (Signed)
Lab Results  Component Value Date   HGBA1C 11.8* 08/08/2014   BG poorly controlled. Will start Humalin 70-30 as per Dr. Boyd Kerbs instruction. Encouraged her to start this medication and continue to record BG. Follow up with Dr. Howell Rucks in 08/2014

## 2014-08-15 NOTE — Telephone Encounter (Signed)
Okay to switch to Humulin 70/30 at same doses instead of Novolog mix 70/30.  thanks

## 2014-08-15 NOTE — Patient Instructions (Signed)
We have called in Humulin 70/30 for you to start 21units twice daily before meals.    Follow up with Dr. Howell Rucks in 08/2014.

## 2014-08-15 NOTE — Progress Notes (Signed)
Pre visit review using our clinic review tool, if applicable. No additional management support is needed unless otherwise documented below in the visit note. 

## 2014-08-15 NOTE — Assessment & Plan Note (Signed)
Lab Results  Component Value Date   LDLCALC 165* 12/30/2013   Lipids above goal. Pt intolerant to statins. Encouraged compliance with healthy diet.

## 2014-08-15 NOTE — Telephone Encounter (Signed)
Approval of change has been fax to pharmacy.

## 2014-09-05 ENCOUNTER — Ambulatory Visit (INDEPENDENT_AMBULATORY_CARE_PROVIDER_SITE_OTHER): Payer: Medicare Other | Admitting: Endocrinology

## 2014-09-05 ENCOUNTER — Encounter: Payer: Self-pay | Admitting: Endocrinology

## 2014-09-05 VITALS — BP 122/62 | HR 88 | Resp 16 | Ht 65.0 in | Wt 163.0 lb

## 2014-09-05 DIAGNOSIS — N183 Chronic kidney disease, stage 3 unspecified: Secondary | ICD-10-CM

## 2014-09-05 DIAGNOSIS — E1129 Type 2 diabetes mellitus with other diabetic kidney complication: Secondary | ICD-10-CM

## 2014-09-05 DIAGNOSIS — Z794 Long term (current) use of insulin: Secondary | ICD-10-CM

## 2014-09-05 DIAGNOSIS — E1122 Type 2 diabetes mellitus with diabetic chronic kidney disease: Secondary | ICD-10-CM | POA: Diagnosis not present

## 2014-09-05 NOTE — Patient Instructions (Signed)
Check sugars 3 times daily ( fasting and premeal readings at alternating times, or at bedtime).  Record them in a sugar log and bring log and meter to next appointment.   Change Humulin 70 /30 to 24 units twice daily as long as you are eating a full meal.  If you plan to eat less than 50% of the meal, then take 12 units only for that meal instead.  Please come back for a follow-up appointment in 1 month.

## 2014-09-05 NOTE — Progress Notes (Signed)
Pre visit review using our clinic review tool, if applicable. No additional management support is needed unless otherwise documented below in the visit note. 

## 2014-09-05 NOTE — Progress Notes (Signed)
REASON FOR VISIT- [name] [name] is a 81 y.o.-year-old female, here for follow up management of Type 2 Diabetes Mellitus, uncontrolled, with complications ( diagnosed in 2009, on insulin therapy since 2011 with basal/bolus insulin therapy and variable FS readings due to in part medication noncompliance in the past; complications of  CAD s/p MI, PVD, Stage 3 CKD, Peripheral neuropathy) . Last seen by me about 6 weeks ago.  She reports working with a Marine scientist, who comes in once monthly to check up on her sugars.  * last visit switched from lantus/novolog to Humulin 70/30. Likes the convenience though sugars are still elevated/ Bolus regimen was getting too complicated for her either as fixed dose versus SS.    Currently taking  -Humulin 70/30 at 21 units BF and supper.   Not forgetting to take her doses. Rotating injection sites.   Last hemoglobin A1c is-  Lab Results  Component Value Date   HGBA1C 11.8* 08/08/2014   HGBA1C 10.2* 04/20/2014   HGBA1C 10.9* 12/30/2013     Patient checks her sugars 3 times daily with a  T[employer]  glucometer. By log book  her sugars are-   PREMEAL Breakfast Lunch Dinner Bedtime Overall  Glucose range: 119-458 202-532 160-424 50-433   Post meals        Hypoglycemia-  Had mild-moderate hypoglycemia in the 50s range occasionally at bedtime. Didn't eat her usual meal at supper that day, but took full dose   she has hypoglycemia awareness at 60/65. No recent  hypoglycemia admissions.    Dietary Habits- Eats two-three times daily. Tries to limit carbs, sodas, desserts. Hardly eats lunch. Eats late BF. Exercise- No formal exercise. Self sufficient with ADLs, lives with son/ Weight- Weight has been generally stable recently.  Leg swelling recently.  Wt Readings from Last 3 Encounters:  09/05/14 163 lb (73.936 kg)  08/15/14 164 lb 8 oz (74.617 kg)  08/08/14 164 lb 4 oz (74.503 kg)    Diabetes Complications- Has known complications Nephropathy-- Yes   CKD, Stage 3 with GFR in 54 range, last BUN/creatinine- Lab Results  Component Value Date   BUN 19 05/05/2014   CREATININE 1.0 05/05/2014    Lab Results  Component Value Date   GFR 54.17* 05/05/2014   Lab Results  Component Value Date   MICRALBCREAT 1.7 09/27/2013    Retinopathy- No,  Last DEE was earlier in 2015. Neuropathy- Yes, Has symptoms of tingling and numbness in her feet. Regular self foot care.  Associated history - has CAD, PVD . No prior stroke. Has hyperlipidemia, but intolerant to statins. Has hypothyroidism. her last set of lipids were-  Lab Results  Component Value Date   CHOL 307* 12/30/2013   HDL 123.40 12/30/2013   LDLCALC 165* 12/30/2013   LDLDIRECT 138.1 06/20/2013   TRIG 92.0 12/30/2013   CHOLHDL 2 12/30/2013     I have reviewed the patient's past medical history, medications and allergies.  Outpatient Encounter Prescriptions as of 09/05/2014  Medication Sig  . ALPRAZolam (XANAX) 0.5 MG tablet Take 1 tablet (0.5 mg total) by mouth 2 (two) times daily as needed for anxiety.  Marland Kitchen BAYER BREEZE 2 TEST DISK TEST SIX TIMES A DAY  . Blood Glucose Monitoring Suppl (PRODIGY AUTOCODE BLOOD GLUCOSE) DEVI Use as directed 4 to 6 times daily Dx: 250.02  . glucose blood (PRODIGY AUTOCODE TEST) test strip Use as instructed  . Insulin Isophane & Regular Human (HUMULIN 70/30 PEN) (70-30) 100 UNIT/ML PEN Inject 21 Units into the  skin 2 (two) times daily before a meal.  . levothyroxine (SYNTHROID, LEVOTHROID) 88 MCG tablet Take 1 tablet (88 mcg total) by mouth daily.  . pantoprazole (PROTONIX) 40 MG tablet TAKE 1 TABLET BY MOUTH TWICE DAILY     Review of Systems- [ x ]  Complains of    [  ]  denies [  ] Recent weight change [  x]  Fatigue [ x ] polydipsia [  ] polyuria [ x ]  nocturia [  ]  vision difficulty [  ] chest pain [  ] shortness of breath [  ] leg swelling [  ] cough [  ] nausea/vomiting [  ] diarrhea [  ] constipation [  ] abdominal pain [  ]  tingling/numbness  in extremities [  ]  concern with feet ( wounds/sores)   PHYSICAL EXAM- BP 122/62 mmHg  Pulse 88  Resp 16  Ht 5\' 5"  (1.651 m)  Wt 163 lb (73.936 kg)  BMI 27.12 kg/m2  SpO2 96% Wt Readings from Last 3 Encounters:  09/05/14 163 lb (73.936 kg)  08/15/14 164 lb 8 oz (74.617 kg)  08/08/14 164 lb 4 oz (74.503 kg)   Exam: deferred     ASSESSMENT/PLAN- 1. Diabetes mellitus uncontrolled 2. Long term use of insulin 3. Stage 3 CKD  Problem List Items Addressed This Visit      Endocrine   Type 2 diabetes mellitus with renal manifestations not at goal - Primary    BS sugars are mostly elevated, but maybe slightly better than on basal/bolus regimen. A1c is above target, when checked jan 2016.   Adjust Humulin 70/30 to 24 units twice daily.  Asked her to take the full dose of insulin if eating a full meal. If eating less than 50% meal, then take half dose of insulin if eating.    Check sugars 3 x daily.  Notify if lows.   RTC 1 months. Prefers to keep monthly follow up appointments rather than faxing sugar logs for further adjustments.              Genitourinary   CKD stage 3 due to type 2 diabetes mellitus     GFR in Stage 3 range in October 2015.                 Other   Long term current use of insulin    Understands that she needs to take her insulin with her meals at BF and supper.                 RTC 1 month with meter/log  Bynum Bellows [facility] 09/06/2014, 9:18 AM

## 2014-09-06 NOTE — Assessment & Plan Note (Signed)
GFR in Stage 3 range in October 2015.

## 2014-09-06 NOTE — Assessment & Plan Note (Signed)
BS sugars are mostly elevated, but maybe slightly better than on basal/bolus regimen. A1c is above target, when checked jan 2016.   Adjust Humulin 70/30 to 24 units twice daily.  Asked her to take the full dose of insulin if eating a full meal. If eating less than 50% meal, then take half dose of insulin if eating.    Check sugars 3 x daily.  Notify if lows.   RTC 1 months. Prefers to keep monthly follow up appointments rather than faxing sugar logs for further adjustments.

## 2014-09-06 NOTE — Assessment & Plan Note (Signed)
Understands that she needs to take her insulin with her meals at BF and supper.

## 2014-09-11 ENCOUNTER — Telehealth: Payer: Self-pay | Admitting: Internal Medicine

## 2014-09-11 NOTE — Telephone Encounter (Signed)
Pt just took 24 units of her 70/30 insulin.  She missed her am dose.  Spoke to [place] her daughter.  [name] [name] has been eating and drinking well.  No nausea or vomiting.  She is going to get pt some dinner and take her to her house (if pt agrees).  Someone will stay with her if she is not agreeable.  Recheck sugar 1-2 hours after she eats and will check again before bed.  Will make sure she eats a bedtime snack.   Will also check her sugar in the middle of the night to confirm no low blood sugar.  Instructed to not give an insulin dose in the middle of the night.  If questions about her sugar this pm or early am - will call.  Give 21 units in am - if sugar ok.  Will call office tomorrow with sugar readings and update.

## 2014-09-11 NOTE — Telephone Encounter (Signed)
Please advise in Dr Walkers absence. 

## 2014-09-11 NOTE — Telephone Encounter (Signed)
Patient Name: [name] [name] DOB: [birth year] Initial Comment Caller states mother's blood sugar is 536 Nurse Assessment Nurse: Marcelline Deist, RN, Lynda Date/Time (Eastern Time): 09/11/2014 3:03:44 PM Confirm and document reason for call. If symptomatic, describe symptoms. ---Caller states mother's blood sugar is 536, was 38 or 43 early this am. EMS was called & they treated her. She seems tired. Is on Humalin pen 70/30 insulin, hasn't taken shot since last night. Changed her to that insulin 3 weeks ago. Has the patient traveled out of the country within the last 30 days? ---Not Applicable Does the patient require triage? ---Yes Related visit to physician within the last 2 weeks? ---Yes Does the PT have any chronic conditions? (i.e. diabetes, asthma, etc.) ---Yes List chronic conditions. ---diabetes, thyroid Guidelines Guideline Title Affirmed Question Affirmed Notes Diabetes - High Blood Sugar Blood glucose > 500 mg/dl (27.5 mmol/l) Final Disposition User Go to ED Now (or PCP triage) Marcelline Deist, RN, Lynda Comments Caller indicates that patient will most likely not agree to be seen in [place]. Has not taken any insulin today. Advised to have pt. drink water to help lower blood sugar levels as well as to take insulin & recheck levels.

## 2014-09-11 NOTE — Telephone Encounter (Signed)
If sugar >500 then agree with ER evaluation -  to confirm why sugars so high and for treatment, fluids, etc.  Why is she not taking her insulin.  Important to take medications as prescribed and to stay hydrated.  It looks like this pt is seeing Dr Howell Rucks for her sugars.

## 2014-09-11 NOTE — Telephone Encounter (Signed)
Spoke with pt, she states she did not go to ER.  She states last night after taking insulin her BS dropped to 43, called EMS and they advised not to take insulin today.  Pt further states she is drinking water.  She states she will not go to the ER.

## 2014-09-12 ENCOUNTER — Emergency Department: Payer: Self-pay | Admitting: Emergency Medicine

## 2014-09-12 DIAGNOSIS — R739 Hyperglycemia, unspecified: Secondary | ICD-10-CM | POA: Diagnosis not present

## 2014-09-12 NOTE — Telephone Encounter (Signed)
Called pt, she did not go to the ED. States she has been sitting at home checking her sugars. At 1:00 it was 498, took  21 units of 70/30, ate a banana. Rechecked her sugar at 3:00, meter is reading "high". Dr. Howell Rucks notified. Pt advised to be seen in ED,  verbalized understanding, states her daughter would be over shortly to take her. Advised I would call her in the morning to follow up.

## 2014-09-12 NOTE — Telephone Encounter (Signed)
Sounds good. Agree with her being sent to ER.  Could we call her later today to make sure that she followed through on our recommendation?

## 2014-09-12 NOTE — Telephone Encounter (Signed)
Spoke to patient, she took her 70/30 insulin last night before dinner. At bedtime , her meter just read "high". She ate a graham cracker and went to bed. Did not check her sugar throughout the night. This morning, her reading is 583, states she "does not feel well", but would not give me any specific details. States she did not take her insulin this morning, is just drinking water. Asked why she didn't take her insulin, states "why should I, my sugar is 583?". Pt seems confused on the phone at times. Advised pt to be seen in ED for elevated sugar and treatment. States she has no one there but herself. Advised pt to call EMS to take her, states she will call her daughter to transport her to ED.

## 2014-09-12 NOTE — Telephone Encounter (Signed)
If the patient doesn't call us by noon, then please call her to get her readings. thanks

## 2014-09-12 NOTE — Telephone Encounter (Signed)
Attempted to call daughter, [name], to discuss pt with her, number we have on file  is disconnected.

## 2014-09-13 NOTE — Telephone Encounter (Signed)
Left message for pt to return my call.

## 2014-09-14 NOTE — Telephone Encounter (Signed)
Spoke to patient, she went to ED Tuesday, waited three hours and left without being seen by a doctor. States they drew labs, gave no results, and no treatment. She did not want to wait any longer to see a provider. Sugar readings yesterday: 153 AM fasting, took 21 units 70/30. At 12:30, 370. At 3:00 was 319, ate lunch. At 5:00 339. At 6:30 260, ate dinner, took 21 units. Ate a graham cracker and milk at bedtime. This morning, 454 fasting. Ate toast, took 21 units 70/30. Asked pt to check while we were on the phone as she hadn't rechecked. Meter reading "high"

## 2014-09-14 NOTE — Telephone Encounter (Signed)
Pt notified and  verbalized understanding. Pt verbalized correct steps for administration. States she does not have any trouble seeing the numbers on the pen and feels comfortable with administration. Advised to call back with continued elevated sugar readings or any low readings,  verbalized understanding. States her daughters are helping her out and keeping an eye on her.

## 2014-09-14 NOTE — Telephone Encounter (Signed)
Her sugars are still staying elevated. Please ask her to change insulin dose to 22 units twice daily ( BF and supper).  Rotate insulin sites and restrict to belly- dont give on arms/legs. Confirm how she is injecting her insulin with the pen-to see that she is doing it correctly. Give insulin prior to meals and eat within 15 minutes.  thanks

## 2014-10-02 ENCOUNTER — Emergency Department: Payer: Self-pay | Admitting: Emergency Medicine

## 2014-10-02 ENCOUNTER — Telehealth: Payer: Self-pay | Admitting: *Deleted

## 2014-10-02 DIAGNOSIS — E1165 Type 2 diabetes mellitus with hyperglycemia: Secondary | ICD-10-CM | POA: Diagnosis not present

## 2014-10-02 DIAGNOSIS — R7309 Other abnormal glucose: Secondary | ICD-10-CM | POA: Diagnosis not present

## 2014-10-02 DIAGNOSIS — R42 Dizziness and giddiness: Secondary | ICD-10-CM | POA: Diagnosis not present

## 2014-10-02 DIAGNOSIS — Z880 Allergy status to penicillin: Secondary | ICD-10-CM | POA: Diagnosis not present

## 2014-10-02 DIAGNOSIS — E860 Dehydration: Secondary | ICD-10-CM | POA: Diagnosis not present

## 2014-10-02 DIAGNOSIS — R531 Weakness: Secondary | ICD-10-CM | POA: Diagnosis not present

## 2014-10-02 DIAGNOSIS — M6281 Muscle weakness (generalized): Secondary | ICD-10-CM | POA: Diagnosis not present

## 2014-10-02 NOTE — Telephone Encounter (Signed)
Pt called states she has been dizzy and lightheaded with nausea for approx 3 days.  Pt states she can not stand or move about.  Further states her blood sugar was 55 last night, this morning it was 300.  I spoke with Dr Gilford Rile, she advised pt to be evaluated at ER.  Pt aware, he stated she would find someone to transport her.

## 2014-10-03 ENCOUNTER — Encounter: Payer: Self-pay | Admitting: Endocrinology

## 2014-10-03 ENCOUNTER — Ambulatory Visit (INDEPENDENT_AMBULATORY_CARE_PROVIDER_SITE_OTHER): Payer: Medicare Other | Admitting: Endocrinology

## 2014-10-03 VITALS — BP 104/58 | HR 90 | Ht 65.0 in | Wt 158.2 lb

## 2014-10-03 DIAGNOSIS — N183 Chronic kidney disease, stage 3 (moderate): Secondary | ICD-10-CM | POA: Diagnosis not present

## 2014-10-03 DIAGNOSIS — E1122 Type 2 diabetes mellitus with diabetic chronic kidney disease: Secondary | ICD-10-CM

## 2014-10-03 DIAGNOSIS — E1129 Type 2 diabetes mellitus with other diabetic kidney complication: Secondary | ICD-10-CM

## 2014-10-03 NOTE — Patient Instructions (Addendum)
Change Humulin 70/30 to 22 units with breakfast and supper.  Eat at consistent times as well consistent amount.   Please come back for a follow-up appointment in 1 month.

## 2014-10-03 NOTE — Progress Notes (Signed)
REASON FOR VISIT- [name] [name] is a 81 y.o.-year-old female, here for follow up management of Type 2 Diabetes Mellitus, uncontrolled, with complications ( diagnosed in 2009, on insulin therapy since 2011 with basal/bolus insulin therapy and variable FS readings due to in part medication noncompliance in the past; complications of  CAD s/p MI, PVD, Stage 3 CKD, Peripheral neuropathy) . Last seen by me about 4 weeks ago.  She reports working with a Marine scientist, who comes in once monthly to check up on her sugars.  * last visit switched from lantus/novolog to Humulin 70/30. Likes the convenience though sugars are still elevated/ Bolus regimen was getting too complicated for her either as fixed dose versus SS. * still having wide variability in her sugars and was at the hospital 1 day prior for elevated sugars. Was given hydration and released. Night prior she had a low.     Currently taking  -Humulin 70/30 at 21 units BF and supper.  *had gone upto 24 units BID, but developed hypoglycemia, ER visit, then dose decreased and now on this one  Not forgetting to take her doses. Rotating injection sites. Reviewed sugar check protocol, and she successfully demonstrated dialing up the insulin dose on the pen and reviewed insulin admin.   Last hemoglobin A1c is-  Lab Results  Component Value Date   HGBA1C 11.8* 08/08/2014   HGBA1C 10.2* 04/20/2014   HGBA1C 10.9* 12/30/2013     Patient checks her sugars 3 times daily with a  [employer]  glucometer. By log book  her sugars are-   PREMEAL Breakfast Lunch Dinner Bedtime Overall  Glucose range: 117-419 263-377 180-500 200-300   Post meals        Hypoglycemia-  Had mild-moderate hypoglycemia in the 40s range occasionally at morning. Didn't eat her usual meal at supper that day, but took full dose   she has hypoglycemia awareness at 60/65.    Dietary Habits- Eats two-three times daily. Tries to limit carbs, sodas, desserts. Hardly eats lunch. Eats  late BF. Exercise- No formal exercise. Self sufficient with ADLs, lives with son/ Weight- Weight has been generally stable recently-down this visit.  Leg swelling recently.  Wt Readings from Last 3 Encounters:  10/03/14 158 lb 4 oz (71.782 kg)  09/05/14 163 lb (73.936 kg)  08/15/14 164 lb 8 oz (74.617 kg)    Diabetes Complications- Has known complications Nephropathy-- Yes  CKD, Stage 3 with GFR in 54 range, last BUN/creatinine- Lab Results  Component Value Date   BUN 19 05/05/2014   CREATININE 1.0 05/05/2014    Lab Results  Component Value Date   GFR 54.17* 05/05/2014   Lab Results  Component Value Date   MICRALBCREAT 1.7 09/27/2013    Retinopathy- No,  Last DEE was earlier in 2015. Neuropathy- Yes, Has symptoms of tingling and numbness in her feet. Regular self foot care.  Associated history - has CAD, PVD . No prior stroke. Has hyperlipidemia, but intolerant to statins. Has hypothyroidism. her last set of lipids were-  Lab Results  Component Value Date   CHOL 307* 12/30/2013   HDL 123.40 12/30/2013   LDLCALC 165* 12/30/2013   LDLDIRECT 138.1 06/20/2013   TRIG 92.0 12/30/2013   CHOLHDL 2 12/30/2013     I have reviewed the patient's past medical history, medications and allergies.  Outpatient Encounter Prescriptions as of 10/03/2014  Medication Sig  . ALPRAZolam (XANAX) 0.5 MG tablet Take 1 tablet (0.5 mg total) by mouth 2 (two) times daily as  needed for anxiety.  Marland Kitchen BAYER BREEZE 2 TEST DISK TEST SIX TIMES A DAY  . Blood Glucose Monitoring Suppl (PRODIGY AUTOCODE BLOOD GLUCOSE) DEVI Use as directed 4 to 6 times daily Dx: 250.02  . glucose blood (PRODIGY AUTOCODE TEST) test strip Use as instructed  . Insulin Isophane & Regular Human (HUMULIN 70/30 PEN) (70-30) 100 UNIT/ML PEN Inject 21 Units into the skin 2 (two) times daily before a meal.  . levothyroxine (SYNTHROID, LEVOTHROID) 88 MCG tablet Take 1 tablet (88 mcg total) by mouth daily.  . pantoprazole (PROTONIX) 40 MG  tablet TAKE 1 TABLET BY MOUTH TWICE DAILY     Review of Systems- [ x ]  Complains of    [  ]  denies [  ] Recent weight change [  x]  Fatigue [ x ] polydipsia [  ] polyuria [ x ]  nocturia [  ]  vision difficulty [  ] chest pain [  ] shortness of breath [  ] leg swelling [  ] cough [  ] nausea/vomiting [  ] diarrhea [  ] constipation [  ] abdominal pain [  ]  tingling/numbness in extremities [  ]  concern with feet ( wounds/sores)   PHYSICAL EXAM- BP 104/58 mmHg  Pulse 90  Ht 5\' 5"  (1.651 m)  Wt 158 lb 4 oz (71.782 kg)  BMI 26.33 kg/m2  SpO2 94% Wt Readings from Last 3 Encounters:  10/03/14 158 lb 4 oz (71.782 kg)  09/05/14 163 lb (73.936 kg)  08/15/14 164 lb 8 oz (74.617 kg)      ASSESSMENT/PLAN- 1. Diabetes mellitus uncontrolled 2. Long term use of insulin 3. Stage 3 CKD  Problem List Items Addressed This Visit      Endocrine   Type 2 diabetes mellitus with renal manifestations not at goal - Primary    BS sugars are mostly elevated, but maybe slightly better than on basal/bolus regimen. A1c is above target, when checked jan 2016.   Adjust Humulin 70/30 to 22 units twice daily- doing small increments and not sure how she will respond. She has wide variability in her FS reads due to unclear reasons and this has been the limiting factor for better FS control.  Asked her to take the full dose of insulin if eating a full meal. If eating less than 50% meal, then take half dose of insulin if eating.    Check sugars 3 x daily.  Notify if lows.   RTC 1 months. Prefers to keep monthly follow up appointments rather than faxing sugar logs for further adjustments.                Genitourinary   CKD stage 3 due to type 2 diabetes mellitus     GFR in Stage 3 range in October 2015.                    RTC 1 month with meter/log 25 minutes spent with the patient, >50% time spent on discussion of topics mentioned- review of insulin dose admin, timing, FS  checks, hypoglycemia treatment  Aron Inge PUSHKAR 10/04/2014, 1:24 PM

## 2014-10-03 NOTE — Progress Notes (Signed)
Pre visit review using our clinic review tool, if applicable. No additional management support is needed unless otherwise documented below in the visit note. 

## 2014-10-04 NOTE — Assessment & Plan Note (Signed)
BS sugars are mostly elevated, but maybe slightly better than on basal/bolus regimen. A1c is above target, when checked jan 2016.   Adjust Humulin 70/30 to 22 units twice daily- doing small increments and not sure how she will respond. She has wide variability in her FS reads due to unclear reasons and this has been the limiting factor for better FS control.  Asked her to take the full dose of insulin if eating a full meal. If eating less than 50% meal, then take half dose of insulin if eating.    Check sugars 3 x daily.  Notify if lows.   RTC 1 months. Prefers to keep monthly follow up appointments rather than faxing sugar logs for further adjustments.

## 2014-10-04 NOTE — Assessment & Plan Note (Signed)
GFR in Stage 3 range in October 2015.

## 2014-10-18 ENCOUNTER — Other Ambulatory Visit: Payer: Self-pay | Admitting: *Deleted

## 2014-10-18 NOTE — Patient Outreach (Signed)
Attempt made to f/u with pt telephonically as discussed two weeks ago to check on blood sugar status, if having three meals a day.   Unable to leave  A voice message as phone kept ringing.   Plan to call pt again tomorrow, next home visit scheduled for 4/7.

## 2014-10-19 ENCOUNTER — Other Ambulatory Visit: Payer: Self-pay | Admitting: *Deleted

## 2014-10-19 NOTE — Patient Outreach (Signed)
Follow up phone call:  Spoke with son Jimmy(on [facility] consent form, resides with pt), states pt is not at home, had an appointment to have hair fixed this afternoon.  Son states pt's blood sugars have not been doing good, this  am was 355, yesterday 286.  Son reviewed pt's readings- 3 days ago 397 in am, 396 at lunch.   Son states after RN CM's home visit, pt started having 3 meals a day, but went down to two.  Son states pt will wait until lunch to eat and then eat late at night.   Son states pt is not exercising.   RN CM discussed with son to encourage pt to have three meals a day, comply with a diabetic diet as well as exercising.  RN CM informed son will have to change next scheduled home visit with pt  from 4/7 to 4/11 to which son said that is okay, to relay to pt.  Son states pt has a MD appointment on 4/12.  Next scheduled home visit with pt 4/11.

## 2014-11-06 ENCOUNTER — Other Ambulatory Visit: Payer: Self-pay | Admitting: *Deleted

## 2014-11-06 VITALS — BP 94/60 | HR 87 | Resp 20

## 2014-11-06 DIAGNOSIS — E1110 Type 2 diabetes mellitus with ketoacidosis without coma: Secondary | ICD-10-CM

## 2014-11-06 NOTE — Patient Outreach (Signed)
[facility] [facility]) Care Management  [place]  11/06/2014   [name] [name] 05-20-1932 [id number]  Subjective:  Pt  States she is to see Dr. Howell Rucks tomorrow, last visit with MD was told to take 22 units of Humulin 70/30 which she has been doing.  Pt states her sugar this morning was 314, yesterday 158.   Pt states son from  [place] is upset daughter and granddaughter are not checking on her, thinking about moving pt to [place], looking for  A place, not found one yet.    Objective:  Pt's blood sugar ranges= 58- 468 in am, 171-579 in pm.  Four readings registered high.    Current Medications:  Current Outpatient Prescriptions  Medication Sig Dispense Refill  . ALPRAZolam (XANAX) 0.5 MG tablet Take 1 tablet (0.5 mg total) by mouth 2 (two) times daily as needed for anxiety. 60 tablet 3  . BAYER BREEZE 2 TEST DISK TEST SIX TIMES A DAY 600 each 1  . Blood Glucose Monitoring Suppl (PRODIGY AUTOCODE BLOOD GLUCOSE) DEVI Use as directed 4 to 6 times daily Dx: 250.02 1 each 0  . glucose blood (PRODIGY AUTOCODE TEST) test strip Use as instructed 150 each 12  . Insulin Isophane & Regular Human (HUMULIN 70/30 PEN) (70-30) 100 UNIT/ML PEN Inject 21 Units into the skin 2 (two) times daily before a meal. 15 mL 11  . levothyroxine (SYNTHROID, LEVOTHROID) 88 MCG tablet Take 1 tablet (88 mcg total) by mouth daily. 90 tablet 3  . pantoprazole (PROTONIX) 40 MG tablet TAKE 1 TABLET BY MOUTH TWICE DAILY 180 tablet 1   No current facility-administered medications for this visit.   Assessment:  DM- fluctuation in blood sugars.  3 low readings in am, 4 registered high on glucometer.   Plan:  Pt to continue to check blood sugars twice a day/record/bring to next MD office visit.             Pt to take  insulin as ordered.              Pt to have protein with breakfast, snacks.              RN CM to continue to provide community nurse case management services, next scheduled home visit          For 5/10.      [name]   [facility]  [phone]

## 2014-11-07 ENCOUNTER — Encounter: Payer: Self-pay | Admitting: Endocrinology

## 2014-11-07 ENCOUNTER — Ambulatory Visit (INDEPENDENT_AMBULATORY_CARE_PROVIDER_SITE_OTHER): Payer: Medicare Other | Admitting: Endocrinology

## 2014-11-07 VITALS — BP 102/58 | HR 92 | Resp 14 | Ht 65.0 in | Wt 156.2 lb

## 2014-11-07 DIAGNOSIS — E039 Hypothyroidism, unspecified: Secondary | ICD-10-CM

## 2014-11-07 DIAGNOSIS — Z794 Long term (current) use of insulin: Secondary | ICD-10-CM

## 2014-11-07 DIAGNOSIS — N183 Chronic kidney disease, stage 3 unspecified: Secondary | ICD-10-CM

## 2014-11-07 DIAGNOSIS — E1129 Type 2 diabetes mellitus with other diabetic kidney complication: Secondary | ICD-10-CM | POA: Diagnosis not present

## 2014-11-07 DIAGNOSIS — E1122 Type 2 diabetes mellitus with diabetic chronic kidney disease: Secondary | ICD-10-CM | POA: Diagnosis not present

## 2014-11-07 LAB — COMPREHENSIVE METABOLIC PANEL
ALBUMIN: 3.7 g/dL (ref 3.5–5.2)
ALT: 14 U/L (ref 0–35)
AST: 20 U/L (ref 0–37)
Alkaline Phosphatase: 106 U/L (ref 39–117)
BUN: 19 mg/dL (ref 6–23)
CALCIUM: 9.6 mg/dL (ref 8.4–10.5)
CO2: 28 mEq/L (ref 19–32)
CREATININE: 1.17 mg/dL (ref 0.40–1.20)
Chloride: 99 mEq/L (ref 96–112)
GFR: 47.23 mL/min — ABNORMAL LOW (ref >60.00)
Glucose, Bld: 280 mg/dL — ABNORMAL HIGH (ref 70–99)
Potassium: 4.3 mEq/L (ref 3.5–5.1)
Sodium: 131 mEq/L — ABNORMAL LOW (ref 135–145)
Total Bilirubin: 0.7 mg/dL (ref 0.2–1.2)
Total Protein: 7.3 g/dL (ref 6.0–8.3)

## 2014-11-07 LAB — TSH: TSH: 5.2 u[IU]/mL — ABNORMAL HIGH (ref 0.35–4.50)

## 2014-11-07 LAB — T4, FREE: FREE T4: 1.06 ng/dL (ref 0.60–1.60)

## 2014-11-07 LAB — HEMOGLOBIN A1C: Hgb A1c MFr Bld: 11.6 % — ABNORMAL HIGH (ref 4.6–6.5)

## 2014-11-07 MED ORDER — LINAGLIPTIN 5 MG PO TABS: 5.0000 mg | ORAL_TABLET | Freq: Every day | ORAL | Status: DC

## 2014-11-07 MED ORDER — INSULIN ISOPHANE & REGULAR (HUMAN 70-30)100 UNIT/ML KWIKPEN: 22.0000 [IU] | PEN_INJECTOR | Freq: Two times a day (BID) | SUBCUTANEOUS | Status: DC

## 2014-11-07 NOTE — Assessment & Plan Note (Signed)
Understands that she needs to take her insulin with her meals at BF and supper.  Reviewed insulin admin and she seems to be doing it correctly.

## 2014-11-07 NOTE — Assessment & Plan Note (Signed)
Clinically euthyroid today. Check thyroid tests today and adjust dose of levothyroxine based on this. She has follow up with PCP next week as well.

## 2014-11-07 NOTE — Assessment & Plan Note (Signed)
BS sugars are mostly elevated, but maybe slightly better than on basal/bolus regimen. A1c is above target, when checked jan 2016.   Adjust Humulin 70/30 to 23 units BF and 22 units with supper- doing small increments and not sure how she will respond. She has wide variability in her FS reads due to unclear reasons and this has been the limiting factor for better FS control.  Add Tradjenta at 5mg  daily with BF to see if this cuts down on some of the glucose variability.    Check sugars 3 x daily.  Notify if lows.   RTC 1 months. Prefers to keep monthly follow up appointments rather than faxing sugar logs for further adjustments.

## 2014-11-07 NOTE — Patient Instructions (Signed)
Change Humulin 70/30 to 23 units with breakfast and 22 units with supper.  Start Tradjenta at 5 mg daily with breakfast. Report back if do not tolerate due to nausea, upper abdominal pain that moves to the back as these could be the symptoms of a rare severe side effect ( pancreatitis).   Labs today.  Please come back for a follow-up appointment in 1 month.

## 2014-11-07 NOTE — Progress Notes (Signed)
Pre visit review using our clinic review tool, if applicable. No additional management support is needed unless otherwise documented below in the visit note. 

## 2014-11-07 NOTE — Assessment & Plan Note (Signed)
GFR in Stage 3 range in October 2015. Update today with urine MA.

## 2014-11-07 NOTE — Progress Notes (Signed)
Patient ID: [name] [name] [name], female   DOB: [birth year], 81 y.o.   MRN: [id number]    REASON FOR VISIT- [name] [name] [name] is a 81 y.o.-year-old female, here for follow [name] management of Type 2 Diabetes Mellitus, uncontrolled, with complications ( diagnosed in 2009, on insulin therapy since 2011 with basal/bolus insulin therapy and variable FS readings due to in part medication noncompliance in the past; complications of  CAD s/p MI, PVD, Stage 3 CKD, Peripheral neuropathy) . Last seen by me about 4 weeks ago.  [name] reports working with a Marine scientist, who comes in once monthly to check [name] on her sugars.  * last visit switched from lantus/novolog to Humulin 70/30. Likes the convenience though sugars are still elevated/ Bolus regimen was getting too complicated for her either as fixed dose versus SS. * still having wide variability in her sugars and was at the hospital 1 day prior for elevated sugars. Was given hydration and released. Night prior [name] had a low.     Currently taking  -Humulin 70/30 at 22 units BF and supper.  *had gone upto 24 units BID 2 visits prior, but developed hypoglycemia, ER visit, then dose decreased to current- hence have been going [name] slowly on her doses  Not forgetting to take her doses. Rotating injection sites. Reviewed sugar check protocol, and [name] successfully demonstrated dialing [name] the insulin dose on the pen and reviewed insulin admin.   Last hemoglobin A1c is-  Lab Results  Component Value Date   HGBA1C 11.8* 08/08/2014   HGBA1C 10.2* 04/20/2014   HGBA1C 10.9* 12/30/2013     Patient checks her sugars 2- 3 times daily with a  [employer]  glucometer. By log book  her sugars are-   PREMEAL Breakfast Lunch Dinner Bedtime Overall  Glucose range: 71-468  174-Hi    Post meals        Hypoglycemia-  Hasnt had any recent spells of low sugars. Lowest was around 71.    [name] [name] [name] [name] at 60/65.    Dietary Habits- Eats two-three times daily. Tries to limit  carbs, sodas, desserts. Hardly eats lunch. Eats late BF. BF- oatmeal/toast lunch- sandwich, dinner- eats out. There is consistency with meal times  Exercise- No formal exercise. Self sufficient with ADLs, lives with son/ [name]- [name] [name] [name] been generally stable recently-down this visit.  Leg swelling recently.  Wt Readings from Last 3 Encounters:  11/07/14 156 lb 4 oz (70.875 kg)  10/03/14 158 lb 4 oz (71.782 kg)  09/05/14 163 lb (73.936 kg)    Diabetes Complications- [name] [name] [name] complications Nephropathy-- Yes  CKD, Stage 3 with GFR in 54 range, last BUN/creatinine- Lab Results  Component Value Date   BUN 19 05/05/2014   CREATININE 1.0 05/05/2014    Lab Results  Component Value Date   GFR 54.17* 05/05/2014   Lab Results  Component Value Date   MICRALBCREAT 1.7 09/27/2013    Retinopathy- No,  Last DEE was earlier in 2015. Neuropathy- Yes, [name] [name] [name] of tingling and numbness in her feet. Regular self foot care.  Associated history - [name] [name] CAD, PVD . No prior stroke. [name] [name] hyperlipidemia, but intolerant to statins. [name] [name] hypothyroidism.Taking levothyroxine as directed, not missed any doses recently. her last set of lipids were-  Lab Results  Component Value Date   CHOL 307* 12/30/2013   HDL 123.40 12/30/2013   LDLCALC 165* 12/30/2013   LDLDIRECT 138.1 06/20/2013   TRIG 92.0 12/30/2013   CHOLHDL 2 12/30/2013  I have reviewed the patient's past medical history, medications and allergies.  Outpatient Encounter Prescriptions as of 11/07/2014  Medication Sig  . ALPRAZolam (XANAX) 0.5 MG tablet Take 1 tablet (0.5 mg total) by mouth 2 (two) times daily as needed for anxiety.  Marland Kitchen BAYER BREEZE 2 TEST DISK TEST SIX TIMES A DAY  . Blood Glucose Monitoring Suppl (PRODIGY AUTOCODE BLOOD GLUCOSE) DEVI Use as directed 4 to 6 times daily Dx: 250.02  . glucose blood (PRODIGY AUTOCODE TEST) test strip Use as instructed  . Insulin Isophane & Regular Human (HUMULIN 70/30 PEN) (70-30) 100 UNIT/ML  PEN Inject 22 Units into the skin 2 (two) times daily before a meal.  . levothyroxine (SYNTHROID, LEVOTHROID) 88 MCG tablet Take 1 tablet (88 mcg total) by mouth daily.  . pantoprazole (PROTONIX) 40 MG tablet TAKE 1 TABLET BY MOUTH TWICE DAILY  . [DISCONTINUED] Insulin Isophane & Regular Human (HUMULIN 70/30 PEN) (70-30) 100 UNIT/ML PEN Inject 21 Units into the skin 2 (two) times daily before a meal.  . [DISCONTINUED] insulin NPH-regular Human (NOVOLIN 70/30) (70-30) 100 UNIT/ML injection Inject into the skin. Pt takes 22 units twice a day before a meal.  . linagliptin (TRADJENTA) 5 MG TABS tablet Take 1 tablet (5 mg total) by mouth daily.     Review of Systems- [ x ]  Complains of    [  ]  denies [  ] Recent [name] change [  x]  Fatigue [  ] polydipsia [  ] polyuria [  ]  nocturia [  ]  vision difficulty [  ] chest pain [  ] shortness of breath [  ] leg swelling [  ] cough [  ] nausea/vomiting [  ] diarrhea [ x ] constipation-chronic [  ] abdominal pain [  ]  tingling/numbness in extremities [  ]  concern with feet ( wounds/sores)   PHYSICAL EXAM- BP 102/58 mmHg  Pulse 92  Resp 14  Ht 5\' 5"  (1.651 m)  Wt 156 lb 4 oz (70.875 kg)  BMI 26.00 kg/m2  SpO2 97% Wt Readings from Last 3 Encounters:  11/07/14 156 lb 4 oz (70.875 kg)  10/03/14 158 lb 4 oz (71.782 kg)  09/05/14 163 lb (73.936 kg)    HEENT: [name]/AT, EOMI, no icterus, no proptosis, no chemosis, no mild lid lag, no retraction, eyes close completely Neck: thyroid gland - smooth, non-tender, no erythema, negative Pemberton's sign; no lymphadenopathy; no bruits Lungs: good air entry, clear bilaterally Heart: S1&S2 normal, regular rate & rhythm; no murmurs, rubs or gallops Ext: no tremor in hands bilaterally, no edema, 2+ DP/PT pulses, good muscle mass Neuro: normal gait, normal 5/5 strength, no proximal myopathy  Derm: no pretibial myxoedema/skin dryness   ASSESSMENT/PLAN-   Problem List Items Addressed This Visit       Endocrine   Hypothyroidism    Clinically euthyroid today. Check thyroid tests today and adjust dose of levothyroxine based on this. [name] [name] [name] with PCP next week as well.       Relevant Orders   TSH   T4, free   Type 2 diabetes mellitus with renal manifestations not at goal - Primary    BS sugars are mostly elevated, but maybe slightly better than on basal/bolus regimen. A1c is above target, when checked jan 2016.   Adjust Humulin 70/30 to 23 units BF and 22 units with supper- doing small increments and not sure how [name] will respond. [name] [name] [name] wide variability in her  FS reads due to unclear reasons and this [name] [name] been the limiting factor for better FS control.  Add Tradjenta at 5mg  daily with BF to see if this cuts down on some of the glucose variability.    Check sugars 3 x daily.  Notify if lows.   RTC 1 months. Prefers to keep monthly follow [name] appointments rather than faxing sugar logs for further adjustments.                Relevant Medications   linagliptin (TRADJENTA) tablet   Insulin Isophane & Regular Human (HUMULIN 70/30 PEN) (70-30) 100 UNIT/ML PEN   Other Relevant Orders   TSH   T4, free   Comprehensive metabolic panel   Hemoglobin A1c   Microalbumin / creatinine urine ratio     Genitourinary   CKD stage 3 due to type 2 diabetes mellitus     GFR in Stage 3 range in October 2015. Update today with urine MA.                   Relevant Medications   linagliptin (TRADJENTA) tablet   Insulin Isophane & Regular Human (HUMULIN 70/30 PEN) (70-30) 100 UNIT/ML PEN   Other Relevant Orders   Comprehensive metabolic panel     Other   Long term current use of insulin    Understands that [name] needs to take her insulin with her meals at BF and supper.  Reviewed insulin admin and [name] seems to be doing it correctly.               Relevant Orders   Hemoglobin A1c      RTC 1 month with meter/log Non fasting today, hence lipids not checked.    Assad Harbeson PUSHKAR 11/07/2014, 4:06 PM

## 2014-11-14 ENCOUNTER — Ambulatory Visit (INDEPENDENT_AMBULATORY_CARE_PROVIDER_SITE_OTHER): Payer: Medicare Other | Admitting: Internal Medicine

## 2014-11-14 ENCOUNTER — Encounter: Payer: Self-pay | Admitting: Internal Medicine

## 2014-11-14 VITALS — BP 102/66 | HR 89 | Temp 98.0°F | Ht 65.0 in | Wt 158.4 lb

## 2014-11-14 DIAGNOSIS — E1129 Type 2 diabetes mellitus with other diabetic kidney complication: Secondary | ICD-10-CM

## 2014-11-14 DIAGNOSIS — N183 Chronic kidney disease, stage 3 (moderate): Secondary | ICD-10-CM

## 2014-11-14 DIAGNOSIS — E1122 Type 2 diabetes mellitus with diabetic chronic kidney disease: Secondary | ICD-10-CM

## 2014-11-14 DIAGNOSIS — E039 Hypothyroidism, unspecified: Secondary | ICD-10-CM

## 2014-11-14 DIAGNOSIS — F411 Generalized anxiety disorder: Secondary | ICD-10-CM

## 2014-11-14 MED ORDER — ALPRAZOLAM 0.5 MG PO TABS: 0.5000 mg | ORAL_TABLET | Freq: Two times a day (BID) | ORAL | Status: DC | PRN

## 2014-11-14 NOTE — Progress Notes (Signed)
Pre visit review using our clinic review tool, if applicable. No additional management support is needed unless otherwise documented below in the visit note. 

## 2014-11-14 NOTE — Progress Notes (Signed)
Subjective:    Patient ID: [name] [name], female    DOB: 1932/06/18, 81 y.o.   MRN: [id number]  HPI  81YO female presents for follow up.  Recently seen by endocrinology. Humalin 70/30 was increased to 23 units qam and 22 units with dinner. Tradjenta 5mg  daily was also added.  DM - She reports that BG have been improved some with changes in Insulin. She did not fill Tradjenta because of concerns about side effects. Still having some BG near 300. No low blood sugars <70 over last few weeks.  Anxiety well controlled with alprazolam as needed. Typically uses about 1-2 times per week at max. Uses at night if cannot sleep. Takes 1/2 tablet.  Fatigue - Energy level generally low. Ongoing for years. No focal symptoms. No chest pain, dyspnea.  Past medical, surgical, family and social history per today's encounter.  Review of Systems  Constitutional: Positive for fatigue. Negative for fever, chills, appetite change and unexpected weight change.  Eyes: Negative for visual disturbance.  Respiratory: Negative for shortness of breath.   Cardiovascular: Negative for chest pain and leg swelling.  Gastrointestinal: Negative for nausea, vomiting, abdominal pain, diarrhea and constipation.  Skin: Negative for color change and rash.  Hematological: Negative for adenopathy. Does not bruise/bleed easily.  Psychiatric/Behavioral: Positive for sleep disturbance. Negative for dysphoric mood. The patient is nervous/anxious.        Objective:    BP 102/66 mmHg  Pulse 89  Temp(Src) 98 F (36.7 C) (Oral)  Ht 5\' 5"  (1.651 m)  Wt 158 lb 6 oz (71.838 kg)  BMI 26.35 kg/m2  SpO2 95% Physical Exam  Constitutional: She is oriented to person, place, and time. She appears well-developed and well-nourished. No distress.  HENT:  Head: Normocephalic and atraumatic.  Right Ear: External ear normal.  Left Ear: External ear normal.  Nose: Nose normal.  Mouth/Throat: Oropharynx is clear and moist. No  oropharyngeal exudate.  Eyes: Conjunctivae are normal. Pupils are equal, round, and reactive to light. Right eye exhibits no discharge. Left eye exhibits no discharge. No scleral icterus.  Neck: Normal range of motion. Neck supple. No tracheal deviation present. No thyromegaly present.  Cardiovascular: Normal rate, regular rhythm, normal heart sounds and intact distal pulses.  Exam reveals no gallop and no friction rub.   No murmur heard. Pulmonary/Chest: Effort normal and breath sounds normal. No respiratory distress. [name] no wheezes. [name] no rales. She exhibits no tenderness.  Musculoskeletal: Normal range of motion. She exhibits no edema or tenderness.  Lymphadenopathy:    [name] no cervical adenopathy.  Neurological: She is alert and oriented to person, place, and time. No cranial nerve deficit. She exhibits normal muscle tone. Coordination normal.  Skin: Skin is warm and dry. No rash noted. She is not diaphoretic. No erythema. No pallor.  Psychiatric: [name] a normal mood and affect. Her behavior is normal. Judgment and thought content normal.          Assessment & Plan:   Problem List Items Addressed This Visit      Unprioritized   Anxiety state    Anxiety well controlled on Alprazolam. Will continue.      Relevant Medications   ALPRAZolam (XANAX) 0.5 MG tablet   CKD stage 3 due to type 2 diabetes mellitus    Recent renal function stable. Repeat labs in 3 months.      Hypothyroidism    Recent TSH high-normal. Will plan to recheck TSH with next labs.  Continue Levothyroxine 88mcg daily.      Type 2 diabetes mellitus with renal manifestations not at goal - Primary    BG improved, however she did not try Tradjenta (and did not bring record today, all BG from memory). Encouraged her to try this medication, at least for a few days. Follow up with Dr. Howell Rucks as scheduled.          Return in about 4 weeks (around 12/12/2014) for Recheck.

## 2014-11-14 NOTE — Assessment & Plan Note (Signed)
Anxiety well controlled on Alprazolam. Will continue.

## 2014-11-14 NOTE — Assessment & Plan Note (Signed)
Recent renal function stable. Repeat labs in 3 months.

## 2014-11-14 NOTE — Assessment & Plan Note (Signed)
BG improved, however she did not try Tradjenta (and did not bring record today, all BG from memory). Encouraged her to try this medication, at least for a few days. Follow up with Dr. Howell Rucks as scheduled.

## 2014-11-14 NOTE — Assessment & Plan Note (Signed)
Recent TSH high-normal. Will plan to recheck TSH with next labs. Continue Levothyroxine 40mcg daily.

## 2014-11-14 NOTE — Patient Instructions (Addendum)
Continue current medications. Consider trying Tradjenta as directed by Dr. Howell Rucks.  Follow up in 3 months or sooner as needed.

## 2014-12-04 ENCOUNTER — Other Ambulatory Visit: Payer: Self-pay | Admitting: *Deleted

## 2014-12-04 NOTE — Patient Outreach (Signed)
Called pt to cancel  5/10 home visit.  As discussed with pt, rescheduled for 5/11.      [name]   [facility]  [phone]

## 2014-12-05 ENCOUNTER — Ambulatory Visit: Payer: Medicare Other | Admitting: *Deleted

## 2014-12-06 ENCOUNTER — Other Ambulatory Visit: Payer: Self-pay | Admitting: *Deleted

## 2014-12-06 VITALS — BP 106/64 | HR 87 | Resp 24

## 2014-12-06 DIAGNOSIS — E1110 Type 2 diabetes mellitus with ketoacidosis without coma: Secondary | ICD-10-CM

## 2014-12-06 NOTE — Patient Outreach (Signed)
[facility]) Care Management  [place]  12/06/2014   [name] [name] 09-10-1931 [id number]  Subjective: Pt reports swelling in right leg, had macaroni/cheese and chicken last night.  Pt reports getting  Sick last week, checked sugar before taking insulin-213, took insulin but did not eat for an hour, checked Sugar 1.5 hours after eating- result was 63.  Pt states she had graham crackers/peanut butter/milk, rechecked 15 minutes later- up to 89, went to bed.  Pt states she has not been taking  Tradjenta, had medication filled.    Pt states to go to [place], stay month of June and July.   Objective:  Vitals- BP 92/60 right arm, recheck after hydration (approximately 8 oz water)- 106/64.                                  O2 sat- 95, respirations-24, pulse-87.                      Trace edema in outer ankles both feet.                                      Current Medications:    Medication List       This list is accurate as of: 12/06/14  6:07 PM.  Always use your most recent med list.               ALPRAZolam 0.5 MG tablet  Commonly known as:  XANAX  Take 1 tablet (0.5 mg total) by mouth 2 (two) times daily as needed for anxiety.     glucose blood test strip  Commonly known as:  PRODIGY AUTOCODE TEST  Use as instructed     BAYER BREEZE 2 TEST Disk  Generic drug:  Glucose Blood  TEST SIX TIMES A DAY     Insulin Isophane & Regular Human (70-30) 100 UNIT/ML PEN  Commonly known as:  HUMULIN 70/30 PEN  Inject 22 Units into the skin 2 (two) times daily before a meal.     levothyroxine 88 MCG tablet  Commonly known as:  SYNTHROID, LEVOTHROID  Take 1 tablet (88 mcg total) by mouth daily.     linagliptin 5 MG Tabs tablet  Commonly known as:  TRADJENTA  Take 5 mg by mouth daily.     pantoprazole 40 MG tablet  Commonly known as:  PROTONIX  TAKE 1 TABLET BY MOUTH TWICE DAILY     Blood Glucose Monitoring Suppl Devi  by Does not apply route.  Accucheck machine     Prodigy Autocode Blood Glucose Devi  Use as directed 4 to 6 times daily Dx: 250.02      per Dr. Howell Rucks office visit 4/12- pt to take Humulin 70/30 23 units in am, 22 units pm before a  meal.   Assessment: DM- review of pt's blood sugars in glucometer (12 in 500's, 4 recorded hi, 5 below 80), ranges 63-543.  Med compliance - pt not taking Tradjenta 21m as ordered.    Low BP reading today- 92/60, after hydration 106/64.   Plan:  Pt to take meds as ordered (Tradjenta, Humulin insulin)            Pt to eat shortly after taking insulin, have snack available if needed  Pt to f/u with Dr. Howell Rucks 5/19.            Pt to stay hydrated.             Plan to f/u with pt 6/3, next scheduled home visit.            Plan to update care plan as needed.   [facility] CM Care Plan Problem One        Patient Outreach from 12/06/2014 in [place] Problem One  Medication compliance    Care Plan for Problem One  Active   [facility] Long Term Goal (31-90 days)  -- [Pt would take Tradjenta 23m daily as ordered for the next 60]   [facility] Long Term Goal Start Date  12/06/14   Interventions for Problem One Long Term Goal  Discussed with pt importance of taking Tradjenta as ordered, had several high blood sugar readings.     [place]        [place]  -- [Low blood sugar waiting too long after taking insulin to eat]   Care Plan for Problem Two  Active   [facility] CM Short Term Goal #1 (0-30 days)  pt to eat shortly after taking insulin for next 30 days    [facility] CM Short Term Goal #1 Start Date  12/06/14   Interventions for Short Term Goal #2   Discussed with pt not to wait too long after taking insulin to eat, could result in low sugar    [facility] CM Care Plan Problem Three        [place] (0-30 [place])  [facility] the [facility]    [facility] #1 Start Date  11/06/14   [facility]al #1 Met Date  -- [not met ]     RZara Chess   [facility]  [phone]

## 2014-12-13 ENCOUNTER — Ambulatory Visit: Payer: Medicare Other | Admitting: Endocrinology

## 2014-12-26 ENCOUNTER — Other Ambulatory Visit: Payer: Self-pay | Admitting: *Deleted

## 2014-12-26 ENCOUNTER — Emergency Department: Payer: Medicare Other

## 2014-12-26 ENCOUNTER — Emergency Department
Admission: EM | Admit: 2014-12-26 | Discharge: 2014-12-26 | Disposition: A | Discharge status: Home / Self Care | Payer: Medicare Other | Attending: Emergency Medicine | Admitting: Emergency Medicine

## 2014-12-26 DIAGNOSIS — N390 Urinary tract infection, site not specified: Secondary | ICD-10-CM

## 2014-12-26 DIAGNOSIS — Z880 Allergy status to penicillin: Secondary | ICD-10-CM | POA: Insufficient documentation

## 2014-12-26 DIAGNOSIS — E11649 Type 2 diabetes mellitus with hypoglycemia without coma: Secondary | ICD-10-CM | POA: Insufficient documentation

## 2014-12-26 DIAGNOSIS — E162 Hypoglycemia, unspecified: Secondary | ICD-10-CM | POA: Diagnosis not present

## 2014-12-26 DIAGNOSIS — Z79899 Other long term (current) drug therapy: Secondary | ICD-10-CM | POA: Insufficient documentation

## 2014-12-26 DIAGNOSIS — I517 Cardiomegaly: Secondary | ICD-10-CM | POA: Diagnosis not present

## 2014-12-26 DIAGNOSIS — Z794 Long term (current) use of insulin: Secondary | ICD-10-CM | POA: Diagnosis not present

## 2014-12-26 DIAGNOSIS — R4182 Altered mental status, unspecified: Secondary | ICD-10-CM | POA: Diagnosis not present

## 2014-12-26 LAB — URINALYSIS COMPLETE WITH MICROSCOPIC (ARMC ONLY)
BACTERIA UA: NONE SEEN
Bilirubin Urine: NEGATIVE
GLUCOSE, UA: 150 mg/dL — AB
Hgb urine dipstick: NEGATIVE
Ketones, ur: NEGATIVE mg/dL
Nitrite: NEGATIVE
Protein, ur: NEGATIVE mg/dL
SPECIFIC GRAVITY, URINE: 1.011 (ref 1.005–1.030)
pH: 5 (ref 5.0–8.0)

## 2014-12-26 LAB — CBC
HCT: 35.7 % (ref 35.0–47.0)
Hemoglobin: 11.9 g/dL — ABNORMAL LOW (ref 12.0–16.0)
MCH: 31.9 pg (ref 26.0–34.0)
MCHC: 33.2 g/dL (ref 32.0–36.0)
MCV: 95.9 fL (ref 80.0–100.0)
Platelets: 235 10*3/uL (ref 150–440)
RBC: 3.73 MIL/uL — ABNORMAL LOW (ref 3.80–5.20)
RDW: 14.4 % (ref 11.5–14.5)
WBC: 6.8 10*3/uL (ref 3.6–11.0)

## 2014-12-26 LAB — COMPREHENSIVE METABOLIC PANEL
ALT: 28 U/L (ref 14–54)
AST: 46 U/L — ABNORMAL HIGH (ref 15–41)
Albumin: 3.6 g/dL (ref 3.5–5.0)
Alkaline Phosphatase: 98 U/L (ref 38–126)
Anion gap: 9 (ref 5–15)
BUN: 20 mg/dL (ref 6–20)
CO2: 28 mmol/L (ref 22–32)
Calcium: 9.3 mg/dL (ref 8.9–10.3)
Chloride: 101 mmol/L (ref 101–111)
Creatinine, Ser: 1.05 mg/dL — ABNORMAL HIGH (ref 0.44–1.00)
GFR calc Af Amer: 57 mL/min — ABNORMAL LOW (ref >60)
GFR calc non Af Amer: 49 mL/min — ABNORMAL LOW (ref >60)
Glucose, Bld: 133 mg/dL — ABNORMAL HIGH (ref 65–99)
Potassium: 3.5 mmol/L (ref 3.5–5.1)
Sodium: 138 mmol/L (ref 135–145)
Total Bilirubin: 0.3 mg/dL (ref 0.3–1.2)
Total Protein: 7.1 g/dL (ref 6.5–8.1)

## 2014-12-26 LAB — GLUCOSE, CAPILLARY
Glucose-Capillary: 121 mg/dL — ABNORMAL HIGH (ref 65–99)
Glucose-Capillary: 140 mg/dL — ABNORMAL HIGH (ref 65–99)

## 2014-12-26 LAB — TROPONIN I: Troponin I: <0.03 ng/mL (ref <0.031)

## 2014-12-26 MED ORDER — NITROFURANTOIN MONOHYD MACRO 100 MG PO CAPS
100.0000 mg | ORAL_CAPSULE | Freq: Once | ORAL | Status: AC
  Administered 2014-12-26: 100 mg via ORAL
  Filled 2014-12-26 (×2): qty 1

## 2014-12-26 MED ORDER — NITROFURANTOIN MONOHYD MACRO 100 MG PO CAPS: 100.0000 mg | ORAL_CAPSULE | Freq: Two times a day (BID) | ORAL | Status: DC

## 2014-12-26 MED ORDER — NITROFURANTOIN MACROCRYSTAL 100 MG PO CAPS
ORAL_CAPSULE | ORAL | Status: DC
Start: 2014-12-26 — End: 2014-12-26
  Filled 2014-12-26: qty 1

## 2014-12-26 NOTE — ED Notes (Addendum)
@   0900 Called pts son, Laverna Peace, @ [phone] at pts request  to ask him to call pts daughter [name] to have her come pick up pt. Pts son does not drive.   @ [phone] Called pts emergency contact, Melba Coon @ [phone], and left message to call back about pt transport home.

## 2014-12-26 NOTE — ED Notes (Signed)
Pt presents to ED with hypoglycemia episode woke up from bed not feeling right. Disability son called EMS. EMS arrived BS 30 EMS gave amp of D50 BS 296 then rechecked 162 10 mins to arrival 300. Pt also ate peanut butter crackers and 8oz of orange juice. IV to left ac. Pt st she was swimmy headed and shaky. [place]- daughter was called by EMS st she has a problem with BS lately been high. Never has her BS dropped this low. PCP 2-3 weeks ago no changes to meds.

## 2014-12-26 NOTE — ED Notes (Signed)
@   0915 called jimmy again. He said melinda is coming to get pt.

## 2014-12-26 NOTE — Discharge Instructions (Signed)
1. Take antibiotic as prescribed (Macrobid 100mg  twice daily x 3 days). 2. Return to the ER for worsening symptoms, fever, persistent vomiting, difficulty breathing or other concerns.  Hypoglycemia Hypoglycemia occurs when the glucose in your blood is too low. Glucose is a type of sugar that is your body's main energy source. Hormones, such as insulin and glucagon, control the level of glucose in the blood. Insulin lowers blood glucose and glucagon increases blood glucose. Having too much insulin in your blood stream, or not eating enough food containing sugar, can result in hypoglycemia. Hypoglycemia can happen to people with or without diabetes. It can develop quickly and can be a medical emergency.  CAUSES   Missing or delaying meals.  Not eating enough carbohydrates at meals.  Taking too much diabetes medicine.  Not timing your oral diabetes medicine or insulin doses with meals, snacks, and exercise.  Nausea and vomiting.  Certain medicines.  Severe illnesses, such as hepatitis, kidney disorders, and certain eating disorders.  Increased activity or exercise without eating something extra or adjusting medicines.  Drinking too much alcohol.  A nerve disorder that affects body functions like your heart rate, blood pressure, and digestion (autonomic neuropathy).  A condition where the stomach muscles do not function properly (gastroparesis). Therefore, medicines and food may not absorb properly.  Rarely, a tumor of the pancreas can produce too much insulin. SYMPTOMS   Hunger.  Sweating (diaphoresis).  Change in body temperature.  Shakiness.  Headache.  Anxiety.  Lightheadedness.  Irritability.  Difficulty concentrating.  Dry mouth.  Tingling or numbness in the hands or feet.  Restless sleep or sleep disturbances.  Altered speech and coordination.  Change in mental status.  Seizures or prolonged convulsions.  Combativeness.  Drowsiness  (lethargic).  Weakness.  Increased heart rate or palpitations.  Confusion.  Pale, gray skin color.  Blurred or double vision.  Fainting. DIAGNOSIS  A physical exam and medical history will be performed. Your caregiver may make a diagnosis based on your symptoms. Blood tests and other lab tests may be performed to confirm a diagnosis. Once the diagnosis is made, your caregiver will see if your signs and symptoms go away once your blood glucose is raised.  TREATMENT  Usually, you can easily treat your hypoglycemia when you notice symptoms.  Check your blood glucose. If it is less than 70 mg/dl, take one of the following:   3-4 glucose tablets.    cup juice.    cup regular soda.   1 cup skim milk.   -1 tube of glucose gel.   5-6 hard candies.   Avoid high-fat drinks or food that may delay a rise in blood glucose levels.  Do not take more than the recommended amount of sugary foods, drinks, gel, or tablets. Doing so will cause your blood glucose to go too high.   Wait 10-15 minutes and recheck your blood glucose. If it is still less than 70 mg/dl or below your target range, repeat treatment.   Eat a snack if it is more than 1 hour until your next meal.  There may be a time when your blood glucose may go so low that you are unable to treat yourself at home when you start to notice symptoms. You may need someone to help you. You may even faint or be unable to swallow. If you cannot treat yourself, someone will need to bring you to the hospital.  [place]  If you have diabetes, follow your diabetes management  plan by:  Taking your medicines as directed.  Following your exercise plan.  Following your meal plan. Do not skip meals. Eat on time.  Testing your blood glucose regularly. Check your blood glucose before and after exercise. If you exercise longer or different than usual, be sure to check blood glucose more frequently.  Wearing your  medical alert jewelry that says you have diabetes.  Identify the cause of your hypoglycemia. Then, develop ways to prevent the recurrence of hypoglycemia.  Do not take a hot bath or shower right after an insulin shot.  Always carry treatment with you. Glucose tablets are the easiest to carry.  If you are going to drink alcohol, drink it only with meals.  Tell friends or family members ways to keep you safe during a seizure. This may include removing hard or sharp objects from the area or turning you on your side.  Maintain a healthy weight. SEEK MEDICAL CARE IF:   You are having problems keeping your blood glucose in your target range.  You are having frequent episodes of hypoglycemia.  You feel you might be having side effects from your medicines.  You are not sure why your blood glucose is dropping so low.  You notice a change in vision or a new problem with your vision. SEEK IMMEDIATE MEDICAL CARE IF:   Confusion develops.  A change in mental status occurs.  The inability to swallow develops.  Fainting occurs. Document Released: 07/14/2005 Document Revised: 07/19/2013 Document Reviewed: 11/10/2011 [facility] Patient Information 2015 [place], [place]. This information is not intended to replace advice given to you by your health care provider. Make sure you discuss any questions you have with your health care provider.  Urinary Tract Infection Urinary tract infections (UTIs) can develop anywhere along your urinary tract. Your urinary tract is your body's drainage system for removing wastes and extra water. Your urinary tract includes two kidneys, two ureters, a bladder, and a urethra. Your kidneys are a pair of bean-shaped organs. Each kidney is about the size of your fist. They are located below your ribs, one on each side of your spine. CAUSES Infections are caused by microbes, which are microscopic organisms, including fungi, viruses, and bacteria. These organisms are so  small that they can only be seen through a microscope. Bacteria are the microbes that most commonly cause UTIs. SYMPTOMS  Symptoms of UTIs may vary by age and gender of the patient and by the location of the infection. Symptoms in young women typically include a frequent and intense urge to urinate and a painful, burning feeling in the bladder or urethra during urination. Older women and men are more likely to be tired, shaky, and weak and have muscle aches and abdominal pain. A fever may mean the infection is in your kidneys. Other symptoms of a kidney infection include pain in your back or sides below the ribs, nausea, and vomiting. DIAGNOSIS To diagnose a UTI, your caregiver will ask you about your symptoms. Your caregiver also will ask to provide a urine sample. The urine sample will be tested for bacteria and white blood cells. White blood cells are made by your body to help fight infection. TREATMENT  Typically, UTIs can be treated with medication. Because most UTIs are caused by a bacterial infection, they usually can be treated with the use of antibiotics. The choice of antibiotic and length of treatment depend on your symptoms and the type of bacteria causing your infection. HOME CARE INSTRUCTIONS  If you  were prescribed antibiotics, take them exactly as your caregiver instructs you. Finish the medication even if you feel better after you have only taken some of the medication.  Drink enough water and fluids to keep your urine clear or pale yellow.  Avoid caffeine, tea, and carbonated beverages. They tend to irritate your bladder.  Empty your bladder often. Avoid holding urine for long periods of time.  Empty your bladder before and after sexual intercourse.  After a bowel movement, women should cleanse from front to back. Use each tissue only once. SEEK MEDICAL CARE IF:   You have back pain.  You develop a fever.  Your symptoms do not begin to resolve within 3 days. SEEK  IMMEDIATE MEDICAL CARE IF:   You have severe back pain or lower abdominal pain.  You develop chills.  You have nausea or vomiting.  You have continued burning or discomfort with urination. MAKE SURE YOU:   Understand these instructions.  Will watch your condition.  Will get help right away if you are not doing well or get worse. Document Released: 04/23/2005 Document Revised: 01/13/2012 Document Reviewed: 08/22/2011 [facility] Patient Information 2015 [place], [place]. This information is not intended to replace advice given to you by your health care provider. Make sure you discuss any questions you have with your health care provider.

## 2014-12-26 NOTE — ED Notes (Signed)
Pt dressed and IV removed by tech.

## 2014-12-26 NOTE — Patient Outreach (Signed)
Telephone call: F/u with pt on recent [facility] ED visit early this morning.  Pt states feeling pretty good. Pt states she woke up this morning, was out of it, tried to check blood sugar, felt like pass out.  Pt states she called son who in turn called EMS.  Pt states EMS checked her sugar and it was 33.  Pt states she had an ice cream sandwich and cantaloupe before she went to bed, which was 11pm, then woke up at 3am.   Pt states she took her insulin when she ate.   RN CM discussed with pt importance of having protein with snack at night, provided examples- help prevent sugar from dropping to which pt voice understanding.    RN CM confirmed with pt scheduled home visit for 6/3.     [name]   [facility]  [phone]

## 2014-12-26 NOTE — ED Notes (Signed)
Pt assisted to bathroom

## 2014-12-26 NOTE — ED Provider Notes (Signed)
[facility] Emergency Department Provider Note  ____________________________________________  Time seen: Approximately 4:20 AM  I have reviewed the triage vital signs and the nursing notes.   HISTORY  Chief Complaint Hypoglycemia    HPI [name] [name] is a 81 y.o. female who presents via EMS from facility with hypoglycemia. Patient awoke stating she was "not feeling right". Patient's BS 30 on EMS arrival; given one amp D50 with improvement. BS 296 immediately after dextrose given. BS 162 approximately 10 minutes thereafter. Patient was also given peanut butter crackers and orange juice. Patient currently voices no complaints, but says at the time she was feeling swimmy-headed and shaky.Denies fever, chills headache, vision changes, chest pain, cough, shortness of breath, abdominal pain, vomiting, diarrhea, numbness, tingling.   Past Medical History  Diagnosis Date  . Diabetes mellitus   . Cancer     2004 Right breast, found on mammogram, radiation therapy, Dr. Bryson Ha, uterine cancer,   . OSA (obstructive sleep apnea)   . Osteoporosis 02/21/09    DEXA scan showed osteoporosis with left femur T-score -2.8.  Marland Kitchen Thyroid disease     Hypothyroid  . Hyperlipidemia   . CAD (coronary artery disease)   . Anxiety state, unspecified   . Vitamin D deficiency   . Esophageal reflux   . Myocardial infarction     Cath negative except for 40% occlusion LAD.  Pt not candidate for betablocker or ACEI because of hypotension  . Hypotension   . Presbyacusis   . COPD (chronic obstructive pulmonary disease)   . Hearing loss   . IBS (irritable bowel syndrome)   . Heart burn     Patient Active Problem List   Diagnosis Date Noted  . Anxiety state 11/14/2014  . Nausea without vomiting 08/15/2014  . Long term current use of insulin 03/08/2014  . 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 CKD stage 3 due to type 2 diabetes mellitus  02/22/2014  . Unspecified constipation 12/02/2012  . Hypothyroidism 09/23/2012  . GERD (gastroesophageal reflux disease) 09/23/2012  . Statin intolerance 07/26/2012  . Anxiety 07/26/2012  . Hyperlipidemia with target LDL less than 70 12/03/2011  . Mesenteric ischemia 07/16/2011  . DM (diabetes mellitus) type II uncontrolled, periph vascular disorder 04/03/2011  . CAD, NATIVE VESSEL 12/13/2009    Past Surgical History  Procedure Laterality Date  . Abdominal hysterectomy      uterine cancer  . Breast surgery    . Colon surgery  2013    [facility]    Current Outpatient Rx  Name  Route  Sig  Dispense  Refill  . ALPRAZolam (XANAX) 0.5 MG tablet   Oral   Take 1 tablet (0.5 mg total) by mouth 2 (two) times daily as needed for anxiety.   60 tablet   3   . BAYER BREEZE 2 TEST DISK      TEST SIX TIMES A DAY   600 each   1     Dispense as written.   . Blood Glucose Monitoring Suppl (PRODIGY AUTOCODE BLOOD GLUCOSE) DEVI      Use as directed 4 to 6 times daily Dx: 250.02 Patient not taking: Reported on 12/06/2014   1 each   0   . Blood Glucose Monitoring Suppl DEVI   Does not apply   by Does not apply route. Accucheck machine         . glucose blood (PRODIGY AUTOCODE TEST) test strip  Use as instructed Patient not taking: Reported on 12/06/2014   150 each   12     Check sugar 4 times daily Dx: 250.02   . Insulin Isophane & Regular Human (HUMULIN 70/30 PEN) (70-30) 100 UNIT/ML PEN   Subcutaneous   Inject 22 Units into the skin 2 (two) times daily before a meal.   15 mL   6   . levothyroxine (SYNTHROID, LEVOTHROID) 88 MCG tablet   Oral   Take 1 tablet (88 mcg total) by mouth daily.   90 tablet   3   . linagliptin (TRADJENTA) 5 MG TABS tablet   Oral   Take 5 mg by mouth daily.         . nitrofurantoin, macrocrystal-monohydrate, (MACROBID) 100 MG capsule   Oral   Take 1 capsule (100 mg total) by mouth 2 (two) times daily.   10 capsule   0   .  pantoprazole (PROTONIX) 40 MG tablet      TAKE 1 TABLET BY MOUTH TWICE DAILY   180 tablet   1     Allergies Penicillins  No family history on file.  Social History History  Substance Use Topics  . Smoking status: Never Smoker   . Smokeless tobacco: Never Used  . Alcohol Use: No    Review of Systems Constitutional: No fever/chills Eyes: No visual changes. ENT: No sore throat. Cardiovascular: Denies chest pain. Respiratory: Denies shortness of breath. Gastrointestinal: No abdominal pain.  No nausea, no vomiting.  No diarrhea.  No constipation. Genitourinary: Negative for dysuria. Musculoskeletal: Negative for back pain. Skin: Negative for rash. Neurological: Positive for feeling "swimmy headed and shaky". Negative for headaches, focal weakness or numbness.  10-point ROS otherwise negative.  ____________________________________________   PHYSICAL EXAM:  VITAL SIGNS: ED Triage Vitals  Enc Vitals Group     BP 12/26/14 0308 134/75 mmHg     Pulse Rate 12/26/14 0308 84     Resp 12/26/14 0308 20     Temp --      Temp src --      SpO2 12/26/14 0303 97 %     Weight 12/26/14 0308 156 lb (70.761 kg)     Height 12/26/14 0308 5\' 6"  (1.676 m)     Head Cir --      Peak Flow --      Pain Score --      Pain Loc --      Pain Edu? --      Excl. in [place]? --     Constitutional: Alert and oriented. Well appearing and in no acute distress. Eyes: Conjunctivae are normal. PERRL. EOMI. Head: Atraumatic. Nose: No congestion/rhinnorhea. Mouth/Throat: Mucous membranes are moist.  Oropharynx non-erythematous. Neck: No stridor.   Cardiovascular: Normal rate, regular rhythm. Grossly normal heart sounds.  Good peripheral circulation. Respiratory: Normal respiratory effort.  No retractions. Lungs CTAB. Gastrointestinal: Soft and nontender. No distention. No abdominal bruits. No CVA tenderness. Musculoskeletal: No lower extremity tenderness nor edema.  No joint effusions. Neurologic:   Normal speech and language. Oriented to place and person. No gross focal neurologic deficits are appreciated. Speech is normal. Gait not tested. Skin:  Skin is warm, dry and intact. No rash noted. Psychiatric: Mood and affect are normal. Speech and behavior are normal.  ____________________________________________   LABS (all labs ordered are listed, but only abnormal results are displayed)  Labs Reviewed  CBC - Abnormal; Notable for the following:    RBC 3.73 (*)    Hemoglobin  11.9 (*)    All other components within normal limits  COMPREHENSIVE METABOLIC PANEL - Abnormal; Notable for the following:    Glucose, Bld 133 (*)    Creatinine, Ser 1.05 (*)    AST 46 (*)    GFR calc non Af Amer 49 (*)    GFR calc Af Amer 57 (*)    All other components within normal limits  URINALYSIS COMPLETEWITH MICROSCOPIC ([facility] ONLY) - Abnormal; Notable for the following:    Color, Urine YELLOW (*)    APPearance CLEAR (*)    Glucose, UA 150 (*)    Leukocytes, UA TRACE (*)    Squamous Epithelial / LPF 0-5 (*)    All other components within normal limits  GLUCOSE, CAPILLARY - Abnormal; Notable for the following:    Glucose-Capillary 121 (*)    All other components within normal limits  GLUCOSE, CAPILLARY - Abnormal; Notable for the following:    Glucose-Capillary 140 (*)    All other components within normal limits  TROPONIN I  CBG MONITORING, ED   ____________________________________________  EKG  ED ECG REPORT I, Matthew Cina J, the attending physician, personally viewed and interpreted this ECG.   Date: 12/26/2014  EKG Time: 0332  Rate: 77  Rhythm: normal EKG, normal sinus rhythm  Axis: Normal  Intervals:none  ST&T Change: Nonspecific  ____________________________________________  RADIOLOGY  Portable chest x-ray (viewed by me, interpreted by Dr. Marisue Humble): Borderline cardiomegaly. No localizing pulmonary  process.  ____________________________________________   PROCEDURES  Procedure(s) performed: None  Critical Care performed: No  ____________________________________________   INITIAL IMPRESSION / ASSESSMENT AND PLAN / ED COURSE  Pertinent labs & imaging results that were available during my care of the patient were reviewed by me and considered in my medical decision making (see chart for details).  81 year old female who presents for hypoglycemia. Will check labs including troponin, chest x-ray, urinalysis for infectious etiology. Will continue to monitor blood sugar closely.  ----------------------------------------- 6:53 AM on 12/26/2014 -----------------------------------------  Repeat BS 140. Patient resting in no acute distress. Voices no complaints at this time. Trace leukocytes noted on urinalysis. Will initiate empiric antibiotics. Strict return precautions given. Patient verbalizes understanding and agrees with plan of care. ____________________________________________   FINAL CLINICAL IMPRESSION(S) / ED DIAGNOSES  Final diagnoses:  Hypoglycemia  UTI (lower urinary tract infection)      Paulette Blanch, MD 12/26/14 [id number]

## 2014-12-29 ENCOUNTER — Other Ambulatory Visit: Payer: Self-pay | Admitting: *Deleted

## 2014-12-29 VITALS — BP 106/54 | HR 82 | Resp 16

## 2014-12-29 DIAGNOSIS — E1110 Type 2 diabetes mellitus with ketoacidosis without coma: Secondary | ICD-10-CM

## 2014-12-30 NOTE — Patient Outreach (Signed)
[facility]) Care Management  [place]  12/30/2014   [name] [name] 1932/01/03 [id number]  Subjective:   Pt reports every morning  this week- early, blood sugars were low except today- 337.   Pt states she had a whole ham sandwich before she went to bed, was told to have protein.    Pt states when she went to the ED (blood sugar 33),was told had a bladder infection, diabetes acting Up because of bladder infection.  Pt states she has one more day of antibiotic.   Pt states she was told to f/u with MD in 2 days, did not make Appointment yet.   Pt states she is planning to go to [place] soon with her son for 6 weeks.  Pt states needs closer monitoring as  son who stays with her was so upset when her sugar was low called EMS but could not say what was wrong with her.    Objective:  Blood sugar readings 33-492, four days of low readings.  Today 337 am, had pt check during                      Home visit- result 542, recheck 582.   Filed Vitals:   12/29/14 1400  BP: 106/54  Pulse: 82  Resp: 16     Current Medications:  Current Outpatient Prescriptions  Medication Sig Dispense Refill  . ALPRAZolam (XANAX) 0.5 MG tablet Take 1 tablet (0.5 mg total) by mouth 2 (two) times daily as needed for anxiety. 60 tablet 3  . Blood Glucose Monitoring Suppl DEVI by Does not apply route. Accucheck machine    . Insulin Isophane & Regular Human (HUMULIN 70/30 PEN) (70-30) 100 UNIT/ML PEN Inject 22 Units into the skin 2 (two) times daily before a meal. 15 mL 6  . levothyroxine (SYNTHROID, LEVOTHROID) 88 MCG tablet Take 1 tablet (88 mcg total) by mouth daily. 90 tablet 3  . nitrofurantoin, macrocrystal-monohydrate, (MACROBID) 100 MG capsule Take 1 capsule (100 mg total) by mouth 2 (two) times daily. 10 capsule 0  . pantoprazole (PROTONIX) 40 MG tablet TAKE 1 TABLET BY MOUTH TWICE DAILY 180 tablet 1  . BAYER BREEZE 2 TEST DISK TEST SIX TIMES A DAY (Patient not taking: Reported on  12/29/2014) 600 each 1  . Blood Glucose Monitoring Suppl (PRODIGY AUTOCODE BLOOD GLUCOSE) DEVI Use as directed 4 to 6 times daily Dx: 250.02 (Patient not taking: Reported on 12/29/2014) 1 each 0  . glucose blood (PRODIGY AUTOCODE TEST) test strip Use as instructed (Patient not taking: Reported on 12/06/2014) 150 each 12  . linagliptin (TRADJENTA) 5 MG TABS tablet Take 5 mg by mouth daily.     No current facility-administered medications for this visit.     Assessment:   Recent ED visit- pt was told to f/u in 2 days, did not made appointment yet.                          DM-  4 mornings of low blood sugars, today elevated.   UTI- one more day of antibiotic.  Plan:  RN CM called Dr. Thomes Dinning office, requested appointment for pt to f/u with MD post ED visit- appointment           Scheduled for 6/8- relayed to pt.           RN CM called Dr. Thomes Dinning office back- spoke with Hoyle Sauer requesting to talk  to MD's nurse- report high             Blood sugar of 582, Carolyn to relay message to nurse.            Pt to continue to check sugars twice a day, record, take insulin as ordered when in [place].            Pt  To have protein at each meal/snack and have 1/2 sandwich at night instead of whole.           RN CM to call pt 7/21 to see if arrived home from [place], check on status, schedule home visit.   [facility] CM Care Plan Problem One        Patient Outreach from 12/06/2014 in [place] Problem One  Medication compliance    Care Plan for Problem One  Active   THN Long Term Goal (31-90 days)  -- [Pt would take Tradjenta 71m daily as ordered for the next 60]   THN Long Term Goal Start Date  12/06/14   Interventions for Problem One Long Term Goal  Discussed with pt importance of taking Tradjenta as ordered, had several high blood sugar readings.     [place]        [place] Outreach [place] 12[place]/2014 [place]  Low blood  sugars in am    Care Plan for Problem Two  Active   THN CM Short Term Goal #1 (0-30 days)  pt to eat shortly after taking insulin for next 30 days    THN CM Short Term Goal #1 Start Date  12/06/14   [facility] Met Date   12/29/14   Interventions for Short Term Goal #2   Discussed with pt not to wait too long after taking insulin to eat, could result in low sugar   THN CM Short Term Goal #2 (0-30 days)  pt to have  protein with meals, snacks in the next 30 days    THN CM Short Term Goal #2 Start Date  12/29/14   Interventions for Short Term Goal #2  Discussed with pt importance of protein with meals/snacks to help keep blood sugars from fluctuating.    THN CM Short Term Goal #3 (0-30 days)  Pt to keep appointment with Dr. WGilford Rileon 6/8.    THN CM Short Term Goal #3 Start Date  12/29/14   Interventions for Short Term Goal #3  Called Dr. WThomes Dinningoffice, informed pt was in ED 5/30- low sugar, UTI, was suppose to f/u 2 days after, appointment made for 6/8 for pt to see MD     [place]Problem Three        [place] Outreach [place] 12[place]06/2014 [place] (0-30 days)  [facility] the [facility]    [facility] Start Date  11/06/14   [facility] #1 Met Date  -- [not met ]     RZara Chess   PMoxeeCare Management  [phone]

## 2015-01-03 ENCOUNTER — Encounter: Payer: Self-pay | Admitting: Internal Medicine

## 2015-01-03 ENCOUNTER — Ambulatory Visit (INDEPENDENT_AMBULATORY_CARE_PROVIDER_SITE_OTHER): Payer: Medicare Other | Admitting: Internal Medicine

## 2015-01-03 VITALS — BP 101/64 | HR 83 | Temp 98.2°F | Ht 66.0 in | Wt 160.0 lb

## 2015-01-03 DIAGNOSIS — E1159 Type 2 diabetes mellitus with other circulatory complications: Secondary | ICD-10-CM

## 2015-01-03 DIAGNOSIS — N390 Urinary tract infection, site not specified: Secondary | ICD-10-CM

## 2015-01-03 DIAGNOSIS — IMO0002 Reserved for concepts with insufficient information to code with codable children: Secondary | ICD-10-CM

## 2015-01-03 DIAGNOSIS — E1165 Type 2 diabetes mellitus with hyperglycemia: Principal | ICD-10-CM

## 2015-01-03 DIAGNOSIS — E1151 Type 2 diabetes mellitus with diabetic peripheral angiopathy without gangrene: Secondary | ICD-10-CM

## 2015-01-03 LAB — POCT URINALYSIS DIPSTICK
BILIRUBIN UA: NEGATIVE
Glucose, UA: >=1000
KETONES UA: NEGATIVE
Leukocytes, UA: NEGATIVE
Nitrite, UA: NEGATIVE
PROTEIN UA: NEGATIVE
RBC UA: NEGATIVE
Spec Grav, UA: 1.01
UROBILINOGEN UA: 0.2
pH, UA: 5

## 2015-01-03 MED ORDER — INSULIN ISOPHANE & REGULAR (HUMAN 70-30)100 UNIT/ML KWIKPEN: 20.0000 [IU] | PEN_INJECTOR | Freq: Two times a day (BID) | SUBCUTANEOUS | Status: DC

## 2015-01-03 NOTE — Patient Instructions (Signed)
Please decrease Insulin to 20 units twice daily.  Monitor blood sugars 2-3 times daily.  Follow up in 3 months or sooner as needed.

## 2015-01-03 NOTE — Progress Notes (Signed)
Subjective:    Patient ID: [name] [name], female    DOB: [birth year], 81 y.o.   MRN: [id number]  HPI  81YO female presents for follow up.  Recent seen at [facility] ED for UTI and hypoglycemia.  Last Monday, had BG of 33. Went to ER. Having frequent low BG prior to this event. Found to have UTI. Treated with antibiotics, Macrobid. Symptoms have improved, but still having some burning with urination..  Currently taking 23 units of 70/30 insulin twice daily. Could not tolerate Tradjenta because of low BG. BG running "up and down."  Lowest this week 33 and highest 540. [name] a low blood sugar almost every morning.  Past medical, surgical, family and social history per today's encounter.  Review of Systems  Constitutional: Negative for fever, chills, appetite change, fatigue and unexpected weight change.  Eyes: Negative for visual disturbance.  Respiratory: Negative for shortness of breath.   Cardiovascular: Negative for chest pain and leg swelling.  Gastrointestinal: Negative for vomiting, abdominal pain, diarrhea and constipation.  Genitourinary: Positive for dysuria. Negative for urgency, frequency, flank pain and pelvic pain.  Skin: Negative for color change and rash.  Hematological: Negative for adenopathy. Does not bruise/bleed easily.  Psychiatric/Behavioral: Negative for dysphoric mood. The patient is not nervous/anxious.        Objective:    BP 101/64 mmHg  Pulse 83  Temp(Src) 98.2 F (36.8 C) (Oral)  Ht 5\' 6"  (1.676 m)  Wt 160 lb (72.576 kg)  BMI 25.84 kg/m2  SpO2 98% Physical Exam  Constitutional: She is oriented to person, place, and time. She appears well-developed and well-nourished. No distress.  HENT:  Head: Normocephalic and atraumatic.  Right Ear: External ear normal.  Left Ear: External ear normal.  Nose: Nose normal.  Mouth/Throat: Oropharynx is clear and moist. No oropharyngeal exudate.  Eyes: Conjunctivae are normal. Pupils are equal, round, and  reactive to light. Right eye exhibits no discharge. Left eye exhibits no discharge. No scleral icterus.  Neck: Normal range of motion. Neck supple. No tracheal deviation present. No thyromegaly present.  Cardiovascular: Normal rate, regular rhythm, normal heart sounds and intact distal pulses.  Exam reveals no gallop and no friction rub.   No murmur heard. Pulmonary/Chest: Effort normal and breath sounds normal. No respiratory distress. [name] no wheezes. [name] no rales. She exhibits no tenderness.  Abdominal: There is no tenderness (no CVA tenderness).  Musculoskeletal: Normal range of motion. She exhibits no edema or tenderness.  Lymphadenopathy:    [name] no cervical adenopathy.  Neurological: She is alert and oriented to person, place, and time. No cranial nerve deficit. She exhibits normal muscle tone. Coordination normal.  Skin: Skin is warm and dry. No rash noted. She is not diaphoretic. No erythema. No pallor.  Psychiatric: [name] a normal mood and affect. Her behavior is normal. Judgment and thought content normal.          Assessment & Plan:   Problem List Items Addressed This Visit      Unprioritized   DM (diabetes mellitus) type II uncontrolled, periph vascular disorder - Primary    Labile, very poorly controlled blood sugars. Given daily low BG<50, will reduce dose of Humalin 70/30 to 20 units bid. Pt will be travelling to [facility] for the next 2 months. Encouraged her to monitor BG carefully and seek care for BG <70. Follow up here in 01/2015 and as needed. Discussed case with Dr. Howell Rucks. May consider V-GO in the future as  well as continuous insulin monitoring.      Relevant Medications   Insulin Isophane & Regular Human (HUMULIN 70/30 PEN) (70-30) 100 UNIT/ML PEN   UTI (lower urinary tract infection)    UA normal today. Will send urine for culture given some persistent symptoms.      Relevant Orders   POCT Urinalysis Dipstick (Completed)   CULTURE, URINE COMPREHENSIVE        Return in about 3 months (around 04/05/2015) for Recheck of Diabetes.

## 2015-01-03 NOTE — Assessment & Plan Note (Signed)
Labile, very poorly controlled blood sugars. Given daily low BG<50, will reduce dose of Humalin 70/30 to 20 units bid. Pt will be travelling to [facility] for the next 2 months. Encouraged her to monitor BG carefully and seek care for BG <70. Follow up here in 01/2015 and as needed. Discussed case with Dr. Howell Rucks. May consider V-GO in the future as well as continuous insulin monitoring.

## 2015-01-03 NOTE — Progress Notes (Signed)
Pre visit review using our clinic review tool, if applicable. No additional management support is needed unless otherwise documented below in the visit note. 

## 2015-01-03 NOTE — Assessment & Plan Note (Signed)
UA normal today. Will send urine for culture given some persistent symptoms.

## 2015-02-15 ENCOUNTER — Other Ambulatory Visit: Payer: Self-pay | Admitting: *Deleted

## 2015-02-15 ENCOUNTER — Ambulatory Visit: Payer: Medicare Other | Admitting: *Deleted

## 2015-02-15 NOTE — Patient Outreach (Signed)
Attempt made to contact pt  to see if returned home from visiting son in [place] yet.  Unable to leave a voice message as phone kept ringing.    Will try again.     [name]   [facility]  [phone]

## 2015-02-20 ENCOUNTER — Ambulatory Visit (INDEPENDENT_AMBULATORY_CARE_PROVIDER_SITE_OTHER): Payer: Medicare Other | Admitting: Internal Medicine

## 2015-02-20 ENCOUNTER — Encounter: Payer: Self-pay | Admitting: Internal Medicine

## 2015-02-20 VITALS — BP 81/49 | HR 100 | Temp 97.9°F | Ht 66.0 in | Wt 152.5 lb

## 2015-02-20 DIAGNOSIS — IMO0002 Reserved for concepts with insufficient information to code with codable children: Secondary | ICD-10-CM

## 2015-02-20 DIAGNOSIS — E1122 Type 2 diabetes mellitus with diabetic chronic kidney disease: Secondary | ICD-10-CM

## 2015-02-20 DIAGNOSIS — R5381 Other malaise: Secondary | ICD-10-CM

## 2015-02-20 DIAGNOSIS — E1165 Type 2 diabetes mellitus with hyperglycemia: Principal | ICD-10-CM

## 2015-02-20 DIAGNOSIS — N183 Chronic kidney disease, stage 3 (moderate): Secondary | ICD-10-CM | POA: Diagnosis not present

## 2015-02-20 DIAGNOSIS — E1159 Type 2 diabetes mellitus with other circulatory complications: Secondary | ICD-10-CM

## 2015-02-20 DIAGNOSIS — E1151 Type 2 diabetes mellitus with diabetic peripheral angiopathy without gangrene: Secondary | ICD-10-CM

## 2015-02-20 LAB — POCT URINALYSIS DIPSTICK
Bilirubin, UA: NEGATIVE
Glucose, UA: 500
Leukocytes, UA: NEGATIVE
NITRITE UA: NEGATIVE
PH UA: 5.5
SPEC GRAV UA: 1.025
Urobilinogen, UA: 0.2

## 2015-02-20 LAB — CBC WITH DIFFERENTIAL/PLATELET
Basophils Absolute: 0 10*3/uL (ref 0.0–0.1)
Basophils Relative: 0.9 % (ref 0.0–3.0)
EOS ABS: 0.4 10*3/uL (ref 0.0–0.7)
EOS PCT: 7.4 % — AB (ref 0.0–5.0)
HEMATOCRIT: 36.6 % (ref 36.0–46.0)
Hemoglobin: 12.5 g/dL (ref 12.0–15.0)
Lymphocytes Relative: 28 % (ref 12.0–46.0)
Lymphs Abs: 1.5 10*3/uL (ref 0.7–4.0)
MCHC: 34.2 g/dL (ref 30.0–36.0)
MCV: 94.2 fl (ref 78.0–100.0)
Monocytes Absolute: 0.3 10*3/uL (ref 0.1–1.0)
Monocytes Relative: 5.4 % (ref 3.0–12.0)
NEUTROS PCT: 58.3 % (ref 43.0–77.0)
Neutro Abs: 3.1 10*3/uL (ref 1.4–7.7)
PLATELETS: 265 10*3/uL (ref 150.0–400.0)
RBC: 3.89 Mil/uL (ref 3.87–5.11)
RDW: 14.1 % (ref 11.5–15.5)
WBC: 5.4 10*3/uL (ref 4.0–10.5)

## 2015-02-20 LAB — HEMOGLOBIN A1C: Hgb A1c MFr Bld: 11 % — ABNORMAL HIGH (ref 4.6–6.5)

## 2015-02-20 NOTE — Assessment & Plan Note (Signed)
Check A1c with labs today. Set up home health assistance with medication. Follow up in 1 week

## 2015-02-20 NOTE — Assessment & Plan Note (Signed)
Recheck renal function with labs today.

## 2015-02-20 NOTE — Progress Notes (Signed)
Subjective:    Patient ID: [name] [name], female    DOB: [birth year], 81 y.o.   MRN: [id number]  HPI 81YO female presents for follow up.  DM - BG continue to run high, mostly above 200, some in 400s. Not feeling well today. Feels shaky and tired. Feels nauseous since last night. No vomiting. No diarrhea. No chest pain, dyspnea. No fever, chills. Compliant with medication.  BP Readings from Last 3 Encounters:  02/20/15 81/49  01/03/15 101/64  12/29/14 106/54       Past medical, surgical, family and social history per today's encounter.  Review of Systems  Constitutional: Positive for fatigue. Negative for fever, chills, appetite change and unexpected weight change.  Eyes: Negative for visual disturbance.  Respiratory: Negative for cough and shortness of breath.   Cardiovascular: Negative for chest pain, palpitations and leg swelling.  Gastrointestinal: Positive for nausea. Negative for vomiting, abdominal pain, diarrhea, constipation and blood in stool.  Musculoskeletal: Negative for myalgias and arthralgias.  Skin: Negative for color change and rash.  Hematological: Negative for adenopathy. Does not bruise/bleed easily.  Psychiatric/Behavioral: Negative for sleep disturbance and dysphoric mood. The patient is not nervous/anxious.        Objective:    BP 81/49 mmHg  Pulse 100  Temp(Src) 97.9 F (36.6 C) (Oral)  Ht 5\' 6"  (1.676 m)  Wt 152 lb 8 oz (69.174 kg)  BMI 24.63 kg/m2  SpO2 95% Physical Exam  Constitutional: She is oriented to person, place, and time. She appears well-developed and well-nourished. No distress.  HENT:  Head: Normocephalic and atraumatic.  Right Ear: External ear normal.  Left Ear: External ear normal.  Nose: Nose normal.  Mouth/Throat: Oropharynx is clear and moist. No oropharyngeal exudate.  Eyes: Conjunctivae are normal. Pupils are equal, round, and reactive to light. Right eye exhibits no discharge. Left eye exhibits no discharge. No  scleral icterus.  Neck: Normal range of motion. Neck supple. No tracheal deviation present. No thyromegaly present.  Cardiovascular: Normal rate, regular rhythm, normal heart sounds and intact distal pulses.  Exam reveals no gallop and no friction rub.   No murmur heard. Pulmonary/Chest: Effort normal and breath sounds normal. No accessory muscle usage. No respiratory distress. [name] no decreased breath sounds. [name] no wheezes. [name] no rhonchi. [name] no rales. She exhibits no tenderness.  Musculoskeletal: Normal range of motion. She exhibits no edema or tenderness.  Lymphadenopathy:    [name] no cervical adenopathy.  Neurological: She is alert and oriented to person, place, and time. No cranial nerve deficit. She exhibits normal muscle tone. Coordination normal.  Skin: Skin is warm and dry. No rash noted. She is not diaphoretic. No erythema. No pallor.  Psychiatric: [name] a normal mood and affect. Her behavior is normal. Judgment and thought content normal.          Assessment & Plan:   Problem List Items Addressed This Visit      Unprioritized   CKD stage 3 due to type 2 diabetes mellitus    Recheck renal function with labs today.      DM (diabetes mellitus) type II uncontrolled, periph vascular disorder - Primary    Check A1c with labs today. Set up home health assistance with medication. Follow up in 1 week      Relevant Orders   CBC with Differential/Platelet   Comprehensive metabolic panel   Hemoglobin A1c   Ambulatory referral to [facility]  General malaise. Exam normal. Will check CBC, CMP, urinalysis today.      Relevant Orders   POCT Urinalysis Dipstick       Return in about 2 days (around 02/22/2015) for Recheck.

## 2015-02-20 NOTE — Addendum Note (Signed)
Addended by: Karlene Einstein D on: 02/20/2015 02:44 PM   Modules accepted: Orders

## 2015-02-20 NOTE — Assessment & Plan Note (Signed)
General malaise. Exam normal. Will check CBC, CMP, urinalysis today.

## 2015-02-20 NOTE — Progress Notes (Signed)
Pre visit review using our clinic review tool, if applicable. No additional management support is needed unless otherwise documented below in the visit note. 

## 2015-02-20 NOTE — Patient Instructions (Addendum)
Labs today.  We will set up home health assistance with possible Meals on Wheels.  Follow up in 1 week.

## 2015-02-21 LAB — COMPREHENSIVE METABOLIC PANEL
ALBUMIN: 4 g/dL (ref 3.5–5.2)
ALT: 22 U/L (ref 0–35)
AST: 21 U/L (ref 0–37)
Alkaline Phosphatase: 113 U/L (ref 39–117)
BUN: 22 mg/dL (ref 6–23)
CO2: 25 mEq/L (ref 19–32)
CREATININE: 1.22 mg/dL — AB (ref 0.40–1.20)
Calcium: 9.5 mg/dL (ref 8.4–10.5)
Chloride: 93 mEq/L — ABNORMAL LOW (ref 96–112)
GFR: 44.97 mL/min — AB (ref >60.00)
GLUCOSE: 394 mg/dL — AB (ref 70–99)
Potassium: 3.9 mEq/L (ref 3.5–5.1)
SODIUM: 128 meq/L — AB (ref 135–145)
Total Bilirubin: 0.5 mg/dL (ref 0.2–1.2)
Total Protein: 7.7 g/dL (ref 6.0–8.3)

## 2015-02-22 ENCOUNTER — Encounter: Payer: Self-pay | Admitting: Internal Medicine

## 2015-02-22 ENCOUNTER — Ambulatory Visit (INDEPENDENT_AMBULATORY_CARE_PROVIDER_SITE_OTHER): Payer: Medicare Other | Admitting: Internal Medicine

## 2015-02-22 VITALS — BP 120/64 | HR 83 | Temp 98.4°F | Ht 66.0 in | Wt 151.6 lb

## 2015-02-22 DIAGNOSIS — E1165 Type 2 diabetes mellitus with hyperglycemia: Principal | ICD-10-CM

## 2015-02-22 DIAGNOSIS — E1159 Type 2 diabetes mellitus with other circulatory complications: Secondary | ICD-10-CM

## 2015-02-22 DIAGNOSIS — IMO0002 Reserved for concepts with insufficient information to code with codable children: Secondary | ICD-10-CM

## 2015-02-22 DIAGNOSIS — E1151 Type 2 diabetes mellitus with diabetic peripheral angiopathy without gangrene: Secondary | ICD-10-CM

## 2015-02-22 MED ORDER — INSULIN ISOPHANE & REGULAR (HUMAN 70-30)100 UNIT/ML KWIKPEN: 25.0000 [IU] | PEN_INJECTOR | Freq: Two times a day (BID) | SUBCUTANEOUS | Status: DC

## 2015-02-22 NOTE — Progress Notes (Signed)
Subjective:    Patient ID: [name] [name], female    DOB: [birth year], 81 y.o.   MRN: [id number]  HPI 81YO female presents for follow up. Last seen 7/26 and noted to have some weakness and dehydration. Blood sugars markedly elevated.  BG last night near 500. All day today, BG near 500. Took Humalin 70/30, 20units this morning. Had some low BG earlier this week, as low as 50-70. Feeling fine except for some fatigue.   Wt Readings from Last 3 Encounters:  02/22/15 151 lb 9.6 oz (68.765 kg)  02/20/15 152 lb 8 oz (69.174 kg)  01/03/15 160 lb (72.576 kg)    Past medical, surgical, family and social history per today's encounter.  Review of Systems  Constitutional: Positive for fatigue. Negative for fever, chills, appetite change and unexpected weight change.  Eyes: Negative for visual disturbance.  Respiratory: Negative for shortness of breath.   Cardiovascular: Negative for chest pain and leg swelling.  Gastrointestinal: Negative for nausea, vomiting, abdominal pain, diarrhea and constipation.  Skin: Negative for color change and rash.  Neurological: Negative for dizziness, tremors, syncope, weakness, light-headedness and headaches.  Hematological: Negative for adenopathy. Does not bruise/bleed easily.  Psychiatric/Behavioral: Negative for dysphoric mood. The patient is not nervous/anxious.        Objective:    BP 120/64 mmHg  Pulse 83  Temp(Src) 98.4 F (36.9 C) (Oral)  Ht 5\' 6"  (1.676 m)  Wt 151 lb 9.6 oz (68.765 kg)  BMI 24.48 kg/m2  SpO2 98% Physical Exam  Constitutional: She is oriented to person, place, and time. She appears well-developed and well-nourished. No distress.  HENT:  Head: Normocephalic and atraumatic.  Right Ear: External ear normal.  Left Ear: External ear normal.  Nose: Nose normal.  Mouth/Throat: Oropharynx is clear and moist. No oropharyngeal exudate.  Eyes: Conjunctivae are normal. Pupils are equal, round, and reactive to light. Right eye  exhibits no discharge. Left eye exhibits no discharge. No scleral icterus.  Neck: Normal range of motion. Neck supple. No tracheal deviation present. No thyromegaly present.  Cardiovascular: Normal rate, regular rhythm, normal heart sounds and intact distal pulses.  Exam reveals no gallop and no friction rub.   No murmur heard. Pulmonary/Chest: Effort normal and breath sounds normal. No respiratory distress. [name] no wheezes. [name] no rales. She exhibits no tenderness.  Musculoskeletal: Normal range of motion. She exhibits no edema or tenderness.  Lymphadenopathy:    [name] no cervical adenopathy.  Neurological: She is alert and oriented to person, place, and time. No cranial nerve deficit. She exhibits normal muscle tone. Coordination normal.  Skin: Skin is warm and dry. No rash noted. She is not diaphoretic. No erythema. No pallor.  Psychiatric: [name] a normal mood and affect. Her behavior is normal. Judgment and thought content normal.          Assessment & Plan:  Over 36min of which >50% spent in face-to-face contact with patient discussing plan of care  Problem List Items Addressed This Visit      Unprioritized   DM (diabetes mellitus) type II uncontrolled, periph vascular disorder - Primary    BG continue to be markedly elevated. [name] had brittle BG control on several different regimens. Most recently, started on Humalin 70/30 under direction of Dr. Howell Rucks. Will increase Humalin 70/30 to 25units daily. Recheck tomorrow. Home health referral was placed. Encouraged her to eat regular meals with lean protein and avoid processed carbohydrates.  Relevant Medications   Insulin Isophane & Regular Human (HUMULIN 70/30 PEN) (70-30) 100 UNIT/ML PEN   Other Relevant Orders   Ambulatory referral to Endocrinology       Return in about 1 day (around 02/23/2015) for Recheck.

## 2015-02-22 NOTE — Assessment & Plan Note (Signed)
BG continue to be markedly elevated. She has had brittle BG control on several different regimens. Most recently, started on Humalin 70/30 under direction of Dr. Howell Rucks. Will increase Humalin 70/30 to 25units daily. Recheck tomorrow. Home health referral was placed. Encouraged her to eat regular meals with lean protein and avoid processed carbohydrates.

## 2015-02-22 NOTE — Progress Notes (Signed)
Pre visit review using our clinic review tool, if applicable. No additional management support is needed unless otherwise documented below in the visit note. 

## 2015-02-22 NOTE — Patient Instructions (Addendum)
Increase Humalin 70/30 to 25units twice daily before meals.  Follow up tomorrow at 11:45am.

## 2015-02-23 ENCOUNTER — Other Ambulatory Visit: Payer: Self-pay | Admitting: *Deleted

## 2015-02-23 ENCOUNTER — Encounter: Payer: Self-pay | Admitting: *Deleted

## 2015-02-23 ENCOUNTER — Ambulatory Visit: Payer: Medicare Other | Admitting: Internal Medicine

## 2015-02-23 MED ORDER — CIPROFLOXACIN HCL 250 MG PO TABS: 250.0000 mg | ORAL_TABLET | Freq: Two times a day (BID) | ORAL | Status: DC

## 2015-02-23 NOTE — Patient Outreach (Signed)
Follow up phone call: RN CM discussed with pt attempt to contact her.   Pt states saw Dr. Gilford Rile three times this week, blood sugars high.  Pt states she went today, sugar is a little better, this am 267, MD today more pleased/ satisfied.  Pt states her sugar yesterday in am  was 555, 582 last night.  Pt states MD increased her insulin (Humulin 70/30) to 25 units twice a day.  Pt states her sugars while in [place] ran high while down there.  Pt reports  her son Elta Guadeloupe (resides in [place]) requested more help for pt, resulted in MD ordering home health.   Pt states she returned home 7/21.  Pt states she is trying to keep with having vegetables, plus has something in between her meals.   RN CM encouraged pt to continue to include vegetables in her diet, protein with her meals/snacks.    As discussed with pt, with being unable to do a home visit this month, scheduled next home visit for 8/2.     [name]   [facility]  [phone]

## 2015-02-24 LAB — URINE CULTURE: Colony Count: >=100000

## 2015-02-27 ENCOUNTER — Other Ambulatory Visit: Payer: Self-pay | Admitting: *Deleted

## 2015-02-27 ENCOUNTER — Encounter: Payer: Self-pay | Admitting: Internal Medicine

## 2015-02-27 ENCOUNTER — Ambulatory Visit (INDEPENDENT_AMBULATORY_CARE_PROVIDER_SITE_OTHER): Payer: Medicare Other | Admitting: Internal Medicine

## 2015-02-27 VITALS — BP 97/63 | HR 83 | Temp 97.5°F | Ht 66.0 in | Wt 152.1 lb

## 2015-02-27 VITALS — BP 100/60 | HR 92 | Resp 20 | Ht 66.0 in | Wt 151.0 lb

## 2015-02-27 DIAGNOSIS — E1129 Type 2 diabetes mellitus with other diabetic kidney complication: Secondary | ICD-10-CM | POA: Diagnosis not present

## 2015-02-27 DIAGNOSIS — N390 Urinary tract infection, site not specified: Secondary | ICD-10-CM | POA: Diagnosis not present

## 2015-02-27 DIAGNOSIS — E1110 Type 2 diabetes mellitus with ketoacidosis without coma: Secondary | ICD-10-CM

## 2015-02-27 NOTE — Assessment & Plan Note (Signed)
Asymptomatic except for some fatigue. However, given marked elevation of blood sugars, question if this infection may be contributing. Start Cipro twice daily. Follow up 2 weeks and prn.

## 2015-02-27 NOTE — Progress Notes (Signed)
Pre visit review using our clinic review tool, if applicable. No additional management support is needed unless otherwise documented below in the visit note. 

## 2015-02-27 NOTE — Patient Instructions (Signed)
Start Cipro to treat urinary tract infection.  Continue 70/30 Insulin 25units twice daily.

## 2015-02-27 NOTE — Progress Notes (Signed)
   Subjective:    Patient ID: Farris Has, female    DOB: 11/29/1931, 81 y.o.   MRN: [id number]  HPI  81YO female presents for follow up.  DM - BG improved. 195 this morning. Taking 25units of 70/30 twice daily. No low BG over last few days.  UTI - Did not get our message to start Cipro. Denies current symptoms of dysuria, fever, chills, flank pain.  Past medical, surgical, family and social history per today's encounter.  Review of Systems  Constitutional: Negative for fever, chills, appetite change, fatigue and unexpected weight change.  Eyes: Negative for visual disturbance.  Respiratory: Negative for shortness of breath.   Cardiovascular: Negative for chest pain and leg swelling.  Gastrointestinal: Negative for nausea, vomiting, abdominal pain, diarrhea and constipation.  Genitourinary: Negative for dysuria, urgency, frequency and flank pain.  Musculoskeletal: Negative for myalgias and arthralgias.  Skin: Negative for color change and rash.  Hematological: Negative for adenopathy. Does not bruise/bleed easily.  Psychiatric/Behavioral: Negative for dysphoric mood. The patient is not nervous/anxious.        Objective:    BP 97/63 mmHg  Pulse 83  Temp(Src) 97.5 F (36.4 C) (Oral)  Ht 5\' 6"  (1.676 m)  Wt 152 lb 2 oz (69.003 kg)  BMI 24.57 kg/m2  SpO2 95% Physical Exam  Constitutional: She is oriented to person, place, and time. She appears well-developed and well-nourished. No distress.  HENT:  Head: Normocephalic and atraumatic.  Right Ear: External ear normal.  Left Ear: External ear normal.  Nose: Nose normal.  Mouth/Throat: Oropharynx is clear and moist. No oropharyngeal exudate.  Eyes: Conjunctivae are normal. Pupils are equal, round, and reactive to light. Right eye exhibits no discharge. Left eye exhibits no discharge. No scleral icterus.  Neck: Normal range of motion. Neck supple. No tracheal deviation present. No thyromegaly present.  Cardiovascular:  Normal rate, regular rhythm, normal heart sounds and intact distal pulses.  Exam reveals no gallop and no friction rub.   No murmur heard. Pulmonary/Chest: Effort normal and breath sounds normal. No respiratory distress. She has no wheezes. She has no rales. She exhibits no tenderness.  Musculoskeletal: Normal range of motion. She exhibits no edema or tenderness.  Lymphadenopathy:    She has no cervical adenopathy.  Neurological: She is alert and oriented to person, place, and time. No cranial nerve deficit. She exhibits normal muscle tone. Coordination normal.  Skin: Skin is warm and dry. No rash noted. She is not diaphoretic. No erythema. No pallor.  Psychiatric: She has a normal mood and affect. Her behavior is normal. Judgment and thought content normal.          Assessment & Plan:   Problem List Items Addressed This Visit      Unprioritized   Type 2 diabetes mellitus with renal manifestations not at goal - Primary    BG improved. Will continue Humalin 70/30 25units bid. We discussed increasing dose, but she prefers to hold off given recent low BG over last few weeks. Follow up recheck in 1-2 weeks.      UTI (lower urinary tract infection)    Asymptomatic except for some fatigue. However, given marked elevation of blood sugars, question if this infection may be contributing. Start Cipro twice daily. Follow up 2 weeks and prn.          Return in about 1 week (around 03/06/2015) for Recheck.

## 2015-02-27 NOTE — Assessment & Plan Note (Signed)
BG improved. Will continue Humalin 70/30 25units bid. We discussed increasing dose, but she prefers to hold off given recent low BG over last few weeks. Follow up recheck in 1-2 weeks.

## 2015-02-28 NOTE — Patient Outreach (Signed)
[facility]) Care Management   02/28/2015  [name] [name] [birth year] [id number]  [name] [name] is an 81 y.o. female  Subjective: Pt states her sugar is better today, 195.  Pt states yesterday in am sugar was 484, at night  582.  Pt states to see Dr. Gilford Rile today.  Pt states she started walking yesterday.  Pt reports  she enjoyed her  Stay in [place] with her son.  Pt states her son from [place]  Discussed with her  getting Meals on Wheels to Which she talked to MD, MD to see what she can do.  Pt states she continues to eat out every day (pm meal). RN CM inquired of pt if still taking Cipro for her UTI  to which pt states completed.    Objective: HH RN came during home visit- after talking with pt- found not eligible,not  homebound. Filed Vitals:   02/27/15 1331  BP: 100/60  Pulse: 92  Resp: 20    ROS  Physical Exam  Constitutional: She is oriented to person, place, and time. She appears well-developed and well-nourished.  Cardiovascular: Normal rate and normal heart sounds.   Respiratory:  Congestion sound noted in right lower lobe posteriorly on inspiration.   GI: Soft.  Musculoskeletal: Normal range of motion.  Neurological: She is alert and oriented to person, place, and time.  Skin: Skin is warm and dry.  Psychiatric: She has a normal mood and affect. Her behavior is normal. Judgment and thought content normal.    Current Medications:  Medications reviewed with pt.  Current Outpatient Prescriptions  Medication Sig Dispense Refill  . ALPRAZolam (XANAX) 0.5 MG tablet Take 1 tablet (0.5 mg total) by mouth 2 (two) times daily as needed for anxiety. 60 tablet 3  . Blood Glucose Monitoring Suppl (PRODIGY AUTOCODE BLOOD GLUCOSE) DEVI Use as directed 4 to 6 times daily Dx: 250.02 1 each 0  . Blood Glucose Monitoring Suppl DEVI by Does not apply route. Accucheck machine    . Insulin Isophane & Regular Human (HUMULIN 70/30 PEN) (70-30) 100 UNIT/ML PEN Inject 25  Units into the skin 2 (two) times daily before a meal. 15 mL 6  . levothyroxine (SYNTHROID, LEVOTHROID) 88 MCG tablet Take 1 tablet (88 mcg total) by mouth daily. 90 tablet 3  . pantoprazole (PROTONIX) 40 MG tablet TAKE 1 TABLET BY MOUTH TWICE DAILY 180 tablet 1  . BAYER BREEZE 2 TEST DISK TEST SIX TIMES A DAY 600 each 1  . ciprofloxacin (CIPRO) 250 MG tablet Take 1 tablet (250 mg total) by mouth 2 (two) times daily. 14 tablet 0  . glucose blood (PRODIGY AUTOCODE TEST) test strip Use as instructed 150 each 12   No current facility-administered medications for this visit.    Functional Status:   In your present state of health, do you have any difficulty performing the following activities: 11/06/2014  Hearing? Y  Vision? N  Difficulty concentrating or making decisions? Y  Walking or climbing stairs? N  Dressing or bathing? N  Doing errands, shopping? N  Preparing Food and eating ? N  Using the Toilet? N  In the past six months, have you accidently leaked urine? N  Do you have problems with loss of bowel control? N  Managing your Medications? N  Housekeeping or managing your Housekeeping? N    Fall/Depression Screening:    PHQ 2/9 Scores 08/15/2014 07/26/2012 06/15/2012  PHQ - 2 Score 0 0 3    Assessment:  DM=  fluctuation in blood sugars, today 195, view of pt's glucometer readings ranges in                                    Am 77-498, pm 83-582.   Exercise and diet compliance  addressed.   Plan: Pt to f/u with Dr. Gilford Rile today           Pt to continue to take insulin as ordered           Pt to increase  walking to twice a week in the next 30 days.            RN CM to continue to provide community nurse case management services, next home visit 9/1.  [facility] CM Care Plan Problem One        Patient Outreach from 12/06/2014 in [place] Problem One  Medication compliance    Care Plan for Problem One  Active   THN Long Term Goal (31-90 days)  -- [Pt would  take Tradjenta 71m daily as ordered for the next 60]   THN Long Term Goal Start Date  12/06/14   Interventions for Problem One Long Term Goal  Discussed with pt importance of taking Tradjenta as ordered, had several high blood sugar readings.     [place]        Patient Outreach [place] 12[place]/2014 [place]  Low blood sugars in am    Care Plan for Problem Two  Active   THN CM Short Term Goal #1 (0-30 days)  pt to eat shortly after taking insulin for next 30 days    THN CM Short Term Goal #1 Start Date  12/06/14   [facility] #1 Met Date   12/29/14   Interventions for Short Term Goal #2   Discussed with pt not to wait too long after taking insulin to eat, could result in low sugar   THN CM Short Term Goal #2 (0-30 days)  pt to have  protein with meals, snacks in the next 30 days    THN CM Short Term Goal #2 Start Date  12/29/14   Interventions for Short Term Goal #2  Discussed with pt importance of protein with meals/snacks to help keep blood sugars from fluctuating.    THN CM Short Term Goal #3 (0-30 days)  Pt to keep appointment with Dr. WGilford Rileon 6/8.    THN CM Short Term Goal #3 Start Date  12/29/14   Interventions for Short Term Goal #3  Called Dr. [facility], informed pt was in ED 5/30- low sugar, UTI, was suppose to f/u 2 days after, appointment made for 6/8 for pt to see MD     [place] Three        Patient Outreach [place] 02[place]/2015 [place]  elevated blood sugars- recent A1C 11    Care Plan for Problem Three  Active   THN Long Term Goal (31-90) days  Pt's A1C would come down one point in the next 90 days    THN Long Term Goal Start Date  02/27/15   Interventions for Problem Three Long Term Goal  Discussed with pt the importance of compliance with a diabetic diet.    THN  CM Short Term Goal #1 (0-30 days)  pt would start walking twice a week for the next  30 days    THN CM Short Term Goal #1 Start Date  02/27/15   Interventions for Short Term Goal #1  Encouraged pt to continue to walk, started yesterday, making sure to pace herself    THN CM Short Term Goal #2 (0-30 days)  Pt would have protein with each meal, snack    THN CM Short Term Goal #2 Start Date  02/27/15   Interventions for Short Term Goal #2  Discussed with pt importance of having protein with her meals, help regulate sugars      [name]   [place] Care Management  [phone]

## 2015-03-06 ENCOUNTER — Ambulatory Visit (INDEPENDENT_AMBULATORY_CARE_PROVIDER_SITE_OTHER): Payer: Medicare Other | Admitting: Internal Medicine

## 2015-03-06 ENCOUNTER — Encounter: Payer: Self-pay | Admitting: Internal Medicine

## 2015-03-06 ENCOUNTER — Telehealth: Payer: Self-pay | Admitting: Internal Medicine

## 2015-03-06 VITALS — BP 98/68 | HR 80 | Temp 98.3°F | Wt 156.0 lb

## 2015-03-06 DIAGNOSIS — E1129 Type 2 diabetes mellitus with other diabetic kidney complication: Secondary | ICD-10-CM

## 2015-03-06 DIAGNOSIS — N3000 Acute cystitis without hematuria: Secondary | ICD-10-CM | POA: Diagnosis not present

## 2015-03-06 DIAGNOSIS — E039 Hypothyroidism, unspecified: Secondary | ICD-10-CM

## 2015-03-06 MED ORDER — INSULIN ISOPHANE & REGULAR (HUMAN 70-30)100 UNIT/ML KWIKPEN: 27.0000 [IU] | PEN_INJECTOR | Freq: Two times a day (BID) | SUBCUTANEOUS | Status: DC

## 2015-03-06 NOTE — Assessment & Plan Note (Signed)
Lab Results  Component Value Date   HGBA1C 11.0* 02/20/2015   BG improved based on her recent checks. Will increase Humulin 70/30 to 27units bid. Follow up in 2 weeks.

## 2015-03-06 NOTE — Assessment & Plan Note (Signed)
Will check TSH with labs given some recent fatigue and mild elevation of TSH last check. Continue Levothyroxine.

## 2015-03-06 NOTE — Patient Instructions (Signed)
Increase 70/30 insulin to 27units twice daily before a meal.  Follow up in 2 weeks.

## 2015-03-06 NOTE — Telephone Encounter (Signed)
Attempted to call [facility], placed on hold for extended period of time.  However labs are available in [place].

## 2015-03-06 NOTE — Progress Notes (Signed)
Subjective:    Patient ID: [name] [name], female    DOB: 19-Sep-1931, 81 y.o.   MRN: [id number]  HPI  81YO female presents for follow up.  Last seen 8/2.  DM - BG mostly near 200. Few outliers in mid 400s. No low BG. Much improved compared to previous.  Feels slightly fatigued. No focal symptoms of chest pain, dyspnea. Notes some mild dysuria. Continues on Cipro, [name] 3 days left. No fever, chills. No flank pain. No urgency or frequency. No hematuria.  BP Readings from Last 3 Encounters:  03/06/15 98/68  02/27/15 97/63  02/27/15 100/60     Past medical, surgical, family and social history per today's encounter.   Review of Systems  Constitutional: Negative for fever, chills, appetite change, fatigue and unexpected weight change.  Eyes: Negative for visual disturbance.  Respiratory: Negative for shortness of breath.   Cardiovascular: Negative for chest pain and leg swelling.  Gastrointestinal: Negative for nausea, vomiting, abdominal pain, diarrhea and constipation.  Genitourinary: Positive for dysuria. Negative for urgency, frequency, hematuria and flank pain.  Skin: Negative for color change and rash.  Hematological: Negative for adenopathy. Does not bruise/bleed easily.  Psychiatric/Behavioral: Negative for dysphoric mood. The patient is not nervous/anxious.        Objective:    BP 98/68 mmHg  Pulse 80  Temp(Src) 98.3 F (36.8 C) (Oral)  Wt 156 lb (70.761 kg)  SpO2 95% Physical Exam  Constitutional: She is oriented to person, place, and time. She appears well-developed and well-nourished. No distress.  HENT:  Head: Normocephalic and atraumatic.  Right Ear: External ear normal.  Left Ear: External ear normal.  Nose: Nose normal.  Mouth/Throat: Oropharynx is clear and moist. No oropharyngeal exudate.  Eyes: Conjunctivae are normal. Pupils are equal, round, and reactive to light. Right eye exhibits no discharge. Left eye exhibits no discharge. No scleral  icterus.  Neck: Normal range of motion. Neck supple. No tracheal deviation present. No thyromegaly present.  Cardiovascular: Normal rate, regular rhythm, normal heart sounds and intact distal pulses.  Exam reveals no gallop and no friction rub.   No murmur heard. Pulmonary/Chest: Effort normal and breath sounds normal. No respiratory distress. [name] no wheezes. [name] no rales. She exhibits no tenderness.  Musculoskeletal: Normal range of motion. She exhibits no edema or tenderness.  Lymphadenopathy:    [name] no cervical adenopathy.  Neurological: She is alert and oriented to person, place, and time. No cranial nerve deficit. She exhibits normal muscle tone. Coordination normal.  Skin: Skin is warm and dry. No rash noted. She is not diaphoretic. No erythema. No pallor.  Psychiatric: [name] a normal mood and affect. Her behavior is normal. Judgment and thought content normal.          Assessment & Plan:   Problem List Items Addressed This Visit      Unprioritized   Acute cystitis without hematuria    On Cipro for UTI. Urine culture showed E. Coli and Klebsiella which was sensitive to Cipro. Given persistent symptoms, will repeat UA and culture today.      Relevant Orders   POCT Urinalysis Dipstick   Hypothyroidism    Will check TSH with labs given some recent fatigue and mild elevation of TSH last check. Continue Levothyroxine.      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 Will increase Humulin 70/30 to 27units bid. Follow up in 2 weeks.      Relevant Medications   Insulin Isophane & Regular Human (HUMULIN 70/30 PEN) (70-30) 100 UNIT/ML PEN       Return in about 2 weeks (around 03/20/2015) for Recheck.

## 2015-03-06 NOTE — Telephone Encounter (Signed)
Pamala Hurry is wanting to know if the pt has had a A1c? Pamala Hurry from [facility]. Thank You!

## 2015-03-06 NOTE — Assessment & Plan Note (Signed)
On Cipro for UTI. Urine culture showed E. Coli and Klebsiella which was sensitive to Cipro. Given persistent symptoms, will repeat UA and culture today.

## 2015-03-06 NOTE — Progress Notes (Signed)
Pre visit review using our clinic review tool, if applicable. No additional management support is needed unless otherwise documented below in the visit note. 

## 2015-03-15 ENCOUNTER — Ambulatory Visit (INDEPENDENT_AMBULATORY_CARE_PROVIDER_SITE_OTHER): Payer: Medicare Other | Admitting: Internal Medicine

## 2015-03-15 ENCOUNTER — Encounter: Payer: Self-pay | Admitting: Internal Medicine

## 2015-03-15 VITALS — BP 106/71 | HR 78 | Temp 97.8°F | Ht 66.0 in | Wt 152.5 lb

## 2015-03-15 DIAGNOSIS — E1165 Type 2 diabetes mellitus with hyperglycemia: Secondary | ICD-10-CM

## 2015-03-15 DIAGNOSIS — E1151 Type 2 diabetes mellitus with diabetic peripheral angiopathy without gangrene: Secondary | ICD-10-CM

## 2015-03-15 DIAGNOSIS — L309 Dermatitis, unspecified: Secondary | ICD-10-CM

## 2015-03-15 DIAGNOSIS — E1159 Type 2 diabetes mellitus with other circulatory complications: Secondary | ICD-10-CM

## 2015-03-15 DIAGNOSIS — E039 Hypothyroidism, unspecified: Secondary | ICD-10-CM

## 2015-03-15 DIAGNOSIS — IMO0002 Reserved for concepts with insufficient information to code with codable children: Secondary | ICD-10-CM

## 2015-03-15 LAB — TSH: TSH: 12.85 u[IU]/mL — ABNORMAL HIGH (ref 0.35–4.50)

## 2015-03-15 MED ORDER — DIPHENHYDRAMINE HCL 25 MG PO TABS: 25.0000 mg | ORAL_TABLET | Freq: Three times a day (TID) | ORAL | Status: DC | PRN

## 2015-03-15 MED ORDER — TRIAMCINOLONE 0.1 % CREAM:EUCERIN CREAM 1:1: 1.0000 "application " | TOPICAL_CREAM | Freq: Two times a day (BID) | CUTANEOUS | Status: DC | PRN

## 2015-03-15 NOTE — Assessment & Plan Note (Signed)
Recheck TSH with labs today. 

## 2015-03-15 NOTE — Progress Notes (Signed)
Subjective:    Patient ID: [name] [name], female    DOB: 11/16/1931, 81 y.o.   MRN: [id number]  HPI  81YO female presents for follow up.  DM - Last visit, increased Humalin 70-30 to 27units twice daily. BG improved, most near 120s. One low of 45. Few near 250. Compliant with meds.  Rash - Started Sunday. Red itchy rash on legs and arms. No new lotions, soaps, perfurme. Using topical Hydrocortisone with no improvement. No fever, chills.  Past Medical History  Diagnosis Date  . Diabetes mellitus   . Cancer     20 04 Right breast, found on mammogram, radiation therapy, Dr. Bryson Ha, uterine cancer,   . OSA (obstructive sleep apnea)   . Osteoporosis 02/21/09    DEXA scan showed osteoporosis with left femur T-score -2.8.  Marland Kitchen Thyroid disease     Hypothyroid  . Hyperlipidemia   . CAD (coronary artery disease)   . Anxiety state, unspecified   . Vitamin D deficiency   . Esophageal reflux   . Myocardial infarction     Cath negative except for 40% occlusion LAD.  Pt not candidate for betablocker or ACEI because of hypotension  . Hypotension   . Presbyacusis   . COPD (chronic obstructive pulmonary disease)   . Hearing loss   . IBS (irritable bowel syndrome)   . Heart burn    No family history on file. Past Surgical History  Procedure Laterality Date  . Abdominal hysterectomy      uterine cancer  . Breast surgery    . Colon surgery  2013    done at [place] History  . Marital Status: Single    Spouse Name: N/A  . Number of Children: N/A  . Years of Education: N/A   Social History Main Topics  . Smoking status: Never Smoker   . Smokeless tobacco: Never Used  . Alcohol Use: No  . Drug Use: No  . Sexual Activity: Not Asked   Other Topics Concern  . None   Social History Narrative    Review of Systems  Constitutional: Negative for fever, chills, appetite change, fatigue and unexpected weight change.  Eyes: Negative for visual disturbance.    Respiratory: Negative for shortness of breath.   Cardiovascular: Negative for chest pain and leg swelling.  Gastrointestinal: Negative for abdominal pain, diarrhea and constipation.  Skin: Positive for color change and rash.  Hematological: Negative for adenopathy. Does not bruise/bleed easily.  Psychiatric/Behavioral: Negative for sleep disturbance and dysphoric mood. The patient is not nervous/anxious.        Objective:    BP 106/71 mmHg  Pulse 78  Temp(Src) 97.8 F (36.6 C) (Oral)  Ht 5\' 6"  (1.676 m)  Wt 152 lb 8 oz (69.174 kg)  BMI 24.63 kg/m2  SpO2 96% Physical Exam  Constitutional: She is oriented to person, place, and time. She appears well-developed and well-nourished. No distress.  HENT:  Head: Normocephalic and atraumatic.  Right Ear: External ear normal.  Left Ear: External ear normal.  Nose: Nose normal.  Mouth/Throat: Oropharynx is clear and moist.  Eyes: Conjunctivae are normal. Pupils are equal, round, and reactive to light. Right eye exhibits no discharge. Left eye exhibits no discharge. No scleral icterus.  Neck: Normal range of motion. Neck supple. No tracheal deviation present. No thyromegaly present.  Cardiovascular: Normal rate, regular rhythm, normal heart sounds and intact distal pulses.  Exam reveals no gallop and no friction rub.  No murmur heard. Pulmonary/Chest: Effort normal and breath sounds normal. No respiratory distress. [name] no wheezes. [name] no rales. She exhibits no tenderness.  Musculoskeletal: Normal range of motion. She exhibits no edema or tenderness.  Lymphadenopathy:    [name] no cervical adenopathy.  Neurological: She is alert and oriented to person, place, and time. No cranial nerve deficit. She exhibits normal muscle tone. Coordination normal.  Skin: Skin is warm and dry. Rash noted. Rash is maculopapular (erythematous rash over legs and arms). She is not diaphoretic. There is erythema. No pallor.  Psychiatric: [name] a normal  mood and affect. Her behavior is normal. Judgment and thought content normal.          Assessment & Plan:   Problem List Items Addressed This Visit      Unprioritized   Dermatitis - Primary    Rash most consistent with allergic dermatitis. May be related to use of Cipro, however late in course of medication. Given distribution, also question contact dermatitis. Will start topical Triamcinolone and oral Benadryl. Follow up recheck 1 week and prn.      Relevant Medications   Triamcinolone Acetonide (TRIAMCINOLONE 0.1 % CREAM : EUCERIN) CREA   diphenhydrAMINE (BENADRYL) 25 MG tablet   DM (diabetes mellitus) type II uncontrolled, periph vascular disorder    BG much improved. Will continue Humalin 70-30 27units bid. Repeat A1c in 04/2015.      Hypothyroidism    Recheck TSH with labs today.      Relevant Orders   TSH       Return in about 1 week (around 03/22/2015) for Recheck.

## 2015-03-15 NOTE — Patient Instructions (Signed)
Start Triamcinolone cream to legs twice daily.  Start Benadryl 25mg  up to three times daily as needed for itching.  Recheck 1 week

## 2015-03-15 NOTE — Assessment & Plan Note (Signed)
BG much improved. Will continue Humalin 70-30 27units bid. Repeat A1c in 04/2015.

## 2015-03-15 NOTE — Progress Notes (Signed)
Pre visit review using our clinic review tool, if applicable. No additional management support is needed unless otherwise documented below in the visit note. 

## 2015-03-15 NOTE — Assessment & Plan Note (Signed)
Rash most consistent with allergic dermatitis. May be related to use of Cipro, however late in course of medication. Given distribution, also question contact dermatitis. Will start topical Triamcinolone and oral Benadryl. Follow up recheck 1 week and prn.

## 2015-03-16 ENCOUNTER — Telehealth: Payer: Self-pay | Admitting: Internal Medicine

## 2015-03-16 ENCOUNTER — Other Ambulatory Visit: Payer: Self-pay

## 2015-03-16 DIAGNOSIS — E039 Hypothyroidism, unspecified: Secondary | ICD-10-CM

## 2015-03-16 MED ORDER — LEVOTHYROXINE SODIUM 88 MCG PO TABS: 88.0000 ug | ORAL_TABLET | Freq: Every day | ORAL | Status: DC

## 2015-03-16 NOTE — Telephone Encounter (Signed)
Pt called to have her levothyroxine (SYNTHROID, LEVOTHROID) 88 MCG tablet medication refill. Pt states she left 3 messages. Thank You!

## 2015-03-16 NOTE — Telephone Encounter (Signed)
Spoke with the patient to let her know that the prescription has been faxed to the [facility] in [place].

## 2015-03-22 ENCOUNTER — Encounter: Payer: Self-pay | Admitting: Internal Medicine

## 2015-03-22 ENCOUNTER — Ambulatory Visit (INDEPENDENT_AMBULATORY_CARE_PROVIDER_SITE_OTHER): Payer: Medicare Other | Admitting: Internal Medicine

## 2015-03-22 VITALS — BP 104/67 | HR 80 | Temp 97.4°F | Ht 66.0 in | Wt 153.4 lb

## 2015-03-22 DIAGNOSIS — E1165 Type 2 diabetes mellitus with hyperglycemia: Principal | ICD-10-CM

## 2015-03-22 DIAGNOSIS — IMO0002 Reserved for concepts with insufficient information to code with codable children: Secondary | ICD-10-CM

## 2015-03-22 DIAGNOSIS — E1159 Type 2 diabetes mellitus with other circulatory complications: Secondary | ICD-10-CM | POA: Diagnosis not present

## 2015-03-22 DIAGNOSIS — E1129 Type 2 diabetes mellitus with other diabetic kidney complication: Secondary | ICD-10-CM

## 2015-03-22 DIAGNOSIS — R197 Diarrhea, unspecified: Secondary | ICD-10-CM

## 2015-03-22 DIAGNOSIS — E1151 Type 2 diabetes mellitus with diabetic peripheral angiopathy without gangrene: Secondary | ICD-10-CM

## 2015-03-22 MED ORDER — INSULIN ISOPHANE & REGULAR (HUMAN 70-30)100 UNIT/ML KWIKPEN: 27.0000 [IU] | PEN_INJECTOR | Freq: Two times a day (BID) | SUBCUTANEOUS | Status: DC

## 2015-03-22 NOTE — Progress Notes (Signed)
Pre visit review using our clinic review tool, if applicable. No additional management support is needed unless otherwise documented below in the visit note. 

## 2015-03-22 NOTE — Progress Notes (Signed)
Subjective:    Patient ID: [name] [name], female    DOB: [birth year], 81 y.o.   MRN: [id number]  HPI  81YO female presents for follow up.  Not feeling well. Having watery diarrhea over last 3 days. No blood in stool. Some aching abdominal pain. Feels like this is improving. No fever. Ate breakfast this morning with egg and toast. Tolerated well. BG last night dropped to 44. Called 911. Given peanut butter and juice with resolution of symptoms. BG this morning 412. Now BG 260. Taking NPH insulin 27 units twice daily.   Past Medical History  Diagnosis Date  . Diabetes mellitus   . Cancer     2004 Right breast, found on mammogram, radiation therapy, Dr. Bryson Ha, uterine cancer,   . OSA (obstructive sleep apnea)   . Osteoporosis 02/21/09    DEXA scan showed osteoporosis with left femur T-score -2.8.  Marland Kitchen Thyroid disease     Hypothyroid  . Hyperlipidemia   . CAD (coronary artery disease)   . Anxiety state, unspecified   . Vitamin D deficiency   . Esophageal reflux   . Myocardial infarction     Cath negative except for 40% occlusion LAD.  Pt not candidate for betablocker or ACEI because of hypotension  . Hypotension   . Presbyacusis   . COPD (chronic obstructive pulmonary disease)   . Hearing loss   . IBS (irritable bowel syndrome)   . Heart burn    No family history on file. Past Surgical History  Procedure Laterality Date  . Abdominal hysterectomy      uterine cancer  . Breast surgery    . Colon surgery  2013    done at [place] History  . Marital Status: Single    Spouse Name: N/A  . Number of Children: N/A  . Years of Education: N/A   Social History Main Topics  . Smoking status: Never Smoker   . Smokeless tobacco: Never Used  . Alcohol Use: No  . Drug Use: No  . Sexual Activity: Not Asked   Other Topics Concern  . None   Social History Narrative    Review of Systems  Constitutional: Positive for fatigue. Negative for  fever, chills, appetite change and unexpected weight change.  Eyes: Negative for visual disturbance.  Respiratory: Negative for shortness of breath.   Cardiovascular: Negative for chest pain and leg swelling.  Gastrointestinal: Positive for abdominal pain and diarrhea. Negative for nausea, vomiting, constipation, blood in stool and abdominal distention.  Musculoskeletal: Negative for myalgias and arthralgias.  Skin: Negative for color change and rash.  Hematological: Negative for adenopathy. Does not bruise/bleed easily.  Psychiatric/Behavioral: Positive for sleep disturbance. Negative for dysphoric mood. The patient is not nervous/anxious.        Objective:    BP 104/67 mmHg  Pulse 80  Temp(Src) 97.4 F (36.3 C) (Oral)  Ht 5\' 6"  (1.676 m)  Wt 153 lb 6 oz (69.57 kg)  BMI 24.77 kg/m2  SpO2 98% Physical Exam  Constitutional: She is oriented to person, place, and time. She appears well-developed and well-nourished. No distress.  HENT:  Head: Normocephalic and atraumatic.  Right Ear: External ear normal.  Left Ear: External ear normal.  Nose: Nose normal.  Mouth/Throat: Oropharynx is clear and moist. No oropharyngeal exudate.  Eyes: Conjunctivae are normal. Pupils are equal, round, and reactive to light. Right eye exhibits no discharge. Left eye exhibits no discharge. No scleral icterus.  Neck:  Normal range of motion. Neck supple. No tracheal deviation present. No thyromegaly present.  Cardiovascular: Normal rate, regular rhythm, normal heart sounds and intact distal pulses.  Exam reveals no gallop and no friction rub.   No murmur heard. Pulmonary/Chest: Effort normal and breath sounds normal. No respiratory distress. [name] no wheezes. [name] no rales. She exhibits no tenderness.  Abdominal: Soft. Bowel sounds are normal. She exhibits no distension and no mass. There is tenderness (generalized mild). There is no rebound and no guarding.  Musculoskeletal: Normal range of motion. She  exhibits no edema or tenderness.  Lymphadenopathy:    [name] no cervical adenopathy.  Neurological: She is alert and oriented to person, place, and time. No cranial nerve deficit. She exhibits normal muscle tone. Coordination normal.  Skin: Skin is warm and dry. No rash noted. She is not diaphoretic. No erythema. No pallor.  Psychiatric: [name] a normal mood and affect. Her behavior is normal. Judgment and thought content normal.          Assessment & Plan:   Problem List Items Addressed This Visit      Unprioritized   Diarrhea    3 days of watery diarrhea, now improving. Suspect viral infection. Encouraged fluid intake, rest. If any persistent or worsening symptoms, then she will call and we discussed stool testing for infectious cause and possible CT imaging.      DM (diabetes mellitus) type II uncontrolled, periph vascular disorder - Primary    BG generally improved. Low BG last night in setting of poor po intake with diarrhea. Symptoms are improving. Encouraged increased fluid intake. She is tolerating regular diet. Will continue Humalin 70/30, 27 units bid with meal. If any persistent diarrhea or poor po intake, then we discussed holding insulin. Follow up in 2 weeks and prn.      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

## 2015-03-22 NOTE — Assessment & Plan Note (Signed)
BG generally improved. Low BG last night in setting of poor po intake with diarrhea. Symptoms are improving. Encouraged increased fluid intake. She is tolerating regular diet. Will continue Humalin 70/30, 27 units bid with meal. If any persistent diarrhea or poor po intake, then we discussed holding insulin. Follow up in 2 weeks and prn.

## 2015-03-22 NOTE — Assessment & Plan Note (Signed)
3 days of watery diarrhea, now improving. Suspect viral infection. Encouraged fluid intake, rest. If any persistent or worsening symptoms, then she will call and we discussed stool testing for infectious cause and possible CT imaging.

## 2015-03-22 NOTE — Patient Instructions (Addendum)
Increase fluid and salt intake over the next few days.  Call immediately if worsening abdominal pain or diarrhea not improving over next few days.  Continue Humalin insulin, 70/30, 27 units twice daily.  Follow up for recheck in 2 weeks or sooner as needed.

## 2015-03-29 ENCOUNTER — Other Ambulatory Visit: Payer: Self-pay | Admitting: *Deleted

## 2015-03-29 VITALS — BP 96/58 | HR 98 | Ht 66.0 in | Wt 158.0 lb

## 2015-03-29 DIAGNOSIS — E1110 Type 2 diabetes mellitus with ketoacidosis without coma: Secondary | ICD-10-CM

## 2015-03-29 NOTE — Patient Outreach (Signed)
[place] [facility]) Care Management   03/29/2015  [name] [name] Nov 28, 1931 [id number]  [name] [name] is an 81 y.o. female  Subjective:  Pt reports blood sugar low one day- out of it, called EMS, sugar checked and result was 44.                       Pt states they gave her peanut butter crackers, orange juice,milk- sugar came up.  Pt reports                         She had 2-3 days of diarrhea, none now.  Pt states blood sugar this am was 177, before lunch 414.  Pt                         States for breakfast had oatmeal/toast with butter but yesterday had a lot of food- blood sugar after 519.                        Pt states numbness in feet is getting worse.    Objective:   Filed Vitals:   03/29/15 1425  BP: 96/58  Pulse: 98   O2 sat- 95%.    ROS  Physical Exam  Constitutional: She is oriented to person, place, and time. She appears well-developed and well-nourished.  Cardiovascular: Normal rate.   Respiratory: Breath sounds normal.  GI: Soft.  Musculoskeletal: Normal range of motion.  Neurological: She is alert and oriented to person, place, and time.  Skin: Skin is warm and dry.  Psychiatric: She has a normal mood and affect. Her behavior is normal. Judgment and thought content normal.    Current Medications:  Reviewed with pt  Current Outpatient Prescriptions  Medication Sig Dispense Refill  . ALPRAZolam (XANAX) 0.5 MG tablet Take 1 tablet (0.5 mg total) by mouth 2 (two) times daily as needed for anxiety. 60 tablet 3  . Blood Glucose Monitoring Suppl DEVI by Does not apply route. Accucheck machine    . Insulin Isophane & Regular Human (HUMULIN 70/30 PEN) (70-30) 100 UNIT/ML PEN Inject 27 Units into the skin 2 (two) times daily before a meal. 15 mL 6  . levothyroxine (SYNTHROID, LEVOTHROID) 88 MCG tablet Take 1 tablet (88 mcg total) by mouth daily. 90 tablet 3  . pantoprazole (PROTONIX) 40 MG tablet TAKE 1 TABLET BY MOUTH TWICE DAILY 180 tablet 1  . BAYER  BREEZE 2 TEST DISK TEST SIX TIMES A DAY (Patient not taking: Reported on 03/29/2015) 600 each 1  . Blood Glucose Monitoring Suppl (PRODIGY AUTOCODE BLOOD GLUCOSE) DEVI Use as directed 4 to 6 times daily Dx: 250.02 (Patient not taking: Reported on 03/29/2015) 1 each 0  . diphenhydrAMINE (BENADRYL) 25 MG tablet Take 1 tablet (25 mg total) by mouth every 8 (eight) hours as needed. (Patient not taking: Reported on 03/29/2015) 30 tablet 0  . glucose blood (PRODIGY AUTOCODE TEST) test strip Use as instructed (Patient not taking: Reported on 03/29/2015) 150 each 12  . Triamcinolone Acetonide (TRIAMCINOLONE 0.1 % CREAM : EUCERIN) CREA Apply 1 application topically 2 (two) times daily as needed. (Patient not taking: Reported on 03/29/2015) 1 each 0   No current facility-administered medications for this visit.    Functional Status:   In your present state of health, do you have any difficulty performing the following activities: 11/06/2014  Hearing? Y  Vision?  N  Difficulty concentrating or making decisions? Y  Walking or climbing stairs? N  Dressing or bathing? N  Doing errands, shopping? N  Preparing Food and eating ? N  Using the Toilet? N  In the past six months, have you accidently leaked urine? N  Do you have problems with loss of bowel control? N  Managing your Medications? N  Housekeeping or managing your Housekeeping? N    Fall/Depression Screening:    PHQ 2/9 Scores 08/15/2014 07/26/2012 06/15/2012  PHQ - 2 Score 0 0 3    Assessment:  DM= fluctuation in blood sugars (am 48-485, pm 169-581).   Pt needs to watch portion sizes, protein with meals/snacks.                             Low BP-  BP today 96/58, discussed importance of hydration (warmer weather).    Plan:  Pt to monitor portion sizes, try having water before meals, eat slower.              Pt to continue to check blood sugars 2-3 times a day/record/bring to next MD office visit            Pt to stay hydrated, BP on the low side today.              RN CM to continue to provide community nurse case management services, to f/u 9/8- if stable plan to                Transfer  to [place] coach.      [facility] CM Care Plan Problem Three        Most Recent Value   Care Plan Problem Three  elevated blood sugars- recent A1C 11    Role Documenting the Problem Three  Care Management Coordinator   Care Plan for Problem Three  Active   THN Long Term Goal (31-90) days  Pt's A1C would come down one point in the next 90 days    THN Long Term Goal Start Date  02/27/15   Interventions for Problem Three Long Term Goal  Discussed with pt the importance of compliance with a diabetic diet.    THN CM Short Term Goal #1 (0-30 days)  pt would start walking twice a week for the next 30 days    THN CM Short Term Goal #1 Start Date  02/27/15   [facility] CM Short Term Goal #1 Met Date  -- [goal not met- pt not been walking at all ]   Interventions for Short Term Goal #1  Encouraged pt to continue to walk, started yesterday, making sure to pace herself    THN CM Short Term Goal #2 (0-30 days)  Pt would have protein with each meal, snack    THN CM Short Term Goal #2 Start Date  02/27/15   [facility] CM Short Term Goal #2 Met Date  03/29/15   Interventions for Short Term Goal #2  Discussed with pt importance of having protein with her meals, help regulate sugars    THN CM Short Term Goal #3 (0-30 days)  Pt wouild not have any low blood sugars in the next 30 days    THN  CM Short Term Goal #3 Start Date  03/29/15   Interventions for Short Term Goal #3  Discussed with pt to switch from peanut butter to  1/2 sandwich with protein at night  THN CM Short Term Goal #4 (0-30 days)  pt would voice understanding on what to do on sick days in the next 30 days    THN CM Short Term Goal #4 Start Date  03/29/15   Interventions for Short Term Goal #4  With recent episode of diarrhea (2-3 days), reviewed with pt what to do, importance of hydration, BRAT diet, call MD if continue,  hold insulin if sugar low.      [name]   [place] Care Management  [phone]

## 2015-04-11 ENCOUNTER — Telehealth: Payer: Self-pay | Admitting: *Deleted

## 2015-04-20 ENCOUNTER — Ambulatory Visit (INDEPENDENT_AMBULATORY_CARE_PROVIDER_SITE_OTHER): Payer: Medicare Other | Admitting: Internal Medicine

## 2015-04-20 ENCOUNTER — Encounter: Payer: Self-pay | Admitting: Internal Medicine

## 2015-04-20 VITALS — BP 103/64 | HR 89 | Temp 97.9°F | Ht 66.0 in | Wt 155.4 lb

## 2015-04-20 DIAGNOSIS — E1129 Type 2 diabetes mellitus with other diabetic kidney complication: Secondary | ICD-10-CM | POA: Diagnosis not present

## 2015-04-20 MED ORDER — PANTOPRAZOLE SODIUM 40 MG PO TBEC: DELAYED_RELEASE_TABLET | ORAL | Status: DC

## 2015-04-20 MED ORDER — INSULIN ISOPHANE & REGULAR (HUMAN 70-30)100 UNIT/ML KWIKPEN: PEN_INJECTOR | SUBCUTANEOUS | Status: DC

## 2015-04-20 NOTE — Progress Notes (Signed)
Pre visit review using our clinic review tool, if applicable. No additional management support is needed unless otherwise documented below in the visit note. 

## 2015-04-20 NOTE — Progress Notes (Signed)
Subjective:    Patient ID: [name] [name], female    DOB: Feb 17, 1932, 81 y.o.   MRN: [id number]  HPI  81YO female presents for follow up.  DM - BG much lower. 81 this morning. Most BG fasting near 80-100. In the afternoon, near 200. Had one low of 42 yesterday. A few other lows this month near 50. Feeling good.  BP Readings from Last 3 Encounters:  04/20/15 103/64  03/29/15 96/58  03/22/15 104/67   Wt Readings from Last 3 Encounters:  04/20/15 155 lb 6 oz (70.478 kg)  03/29/15 158 lb (71.668 kg)  03/22/15 153 lb 6 oz (69.57 kg)     Past Medical History  Diagnosis Date  . Diabetes mellitus   . Cancer     2004 Right breast, found on mammogram, radiation therapy, Dr. Bryson Ha, uterine cancer,   . OSA (obstructive sleep apnea)   . Osteoporosis 02/21/09    DEXA scan showed osteoporosis with left femur T-score -2.8.  Marland Kitchen Thyroid disease     Hypothyroid  . Hyperlipidemia   . CAD (coronary artery disease)   . Anxiety state, unspecified   . Vitamin D deficiency   . Esophageal reflux   . Myocardial infarction     Cath negative except for 40% occlusion LAD.  Pt not candidate for betablocker or ACEI because of hypotension  . Hypotension   . Presbyacusis   . COPD (chronic obstructive pulmonary disease)   . Hearing loss   . IBS (irritable bowel syndrome)   . Heart burn    No family history on file. Past Surgical History  Procedure Laterality Date  . Abdominal hysterectomy      uterine cancer  . Breast surgery    . Colon surgery  2013    done at [facility] History  . Marital Status: Single    Spouse Name: N/A  . Number of Children: N/A  . Years of Education: N/A   Social History Main Topics  . Smoking status: Never Smoker   . Smokeless tobacco: Never Used  . Alcohol Use: No  . Drug Use: No  . Sexual Activity: Not Asked   Other Topics Concern  . None   Social History Narrative    Review of Systems  Constitutional: Negative for fever,  chills, appetite change, fatigue and unexpected weight change.  Eyes: Negative for visual disturbance.  Respiratory: Negative for shortness of breath.   Cardiovascular: Negative for chest pain and leg swelling.  Gastrointestinal: Negative for nausea, vomiting, abdominal pain, diarrhea and constipation.  Skin: Negative for color change and rash.  Hematological: Negative for adenopathy. Does not bruise/bleed easily.  Psychiatric/Behavioral: Negative for dysphoric mood. The patient is not nervous/anxious.        Objective:    BP 103/64 mmHg  Pulse 89  Temp(Src) 97.9 F (36.6 C) (Oral)  Ht 5\' 6"  (1.676 m)  Wt 155 lb 6 oz (70.478 kg)  BMI 25.09 kg/m2  SpO2 95% Physical Exam  Constitutional: She is oriented to person, place, and time. She appears well-developed and well-nourished. No distress.  HENT:  Head: Normocephalic and atraumatic.  Right Ear: External ear normal.  Left Ear: External ear normal.  Nose: Nose normal.  Mouth/Throat: Oropharynx is clear and moist. No oropharyngeal exudate.  Eyes: Conjunctivae are normal. Pupils are equal, round, and reactive to light. Right eye exhibits no discharge. Left eye exhibits no discharge. No scleral icterus.  Neck: Normal range of motion. Neck  supple. No tracheal deviation present. No thyromegaly present.  Cardiovascular: Normal rate, regular rhythm, normal heart sounds and intact distal pulses.  Exam reveals no gallop and no friction rub.   No murmur heard. Pulmonary/Chest: Effort normal and breath sounds normal. No respiratory distress. [name] no wheezes. [name] no rales. She exhibits no tenderness.  Musculoskeletal: Normal range of motion. She exhibits no edema or tenderness.  Lymphadenopathy:    [name] no cervical adenopathy.  Neurological: She is alert and oriented to person, place, and time. No cranial nerve deficit. She exhibits normal muscle tone. Coordination normal.  Skin: Skin is warm and dry. No rash noted. She is not  diaphoretic. No erythema. No pallor.  Psychiatric: [name] a normal mood and affect. Her behavior is normal. Judgment and thought content normal.          Assessment & Plan:   Problem List Items Addressed This Visit      Unprioritized   Type 2 diabetes mellitus with renal manifestations not at goal - Primary    BG improved, however having some lows in the night and mornings. Will decrease suppertime insulin to 20units. Continue breakfast 27units. Follow up in 4 weeks and prn.      Relevant Medications   Insulin Isophane & Regular Human (HUMULIN 70/30 PEN) (70-30) 100 UNIT/ML PEN       Return in about 4 weeks (around 05/18/2015) for Recheck.

## 2015-04-20 NOTE — Patient Instructions (Addendum)
Continue 27 units of Humalin 70/30 Insulin in the morning with breakfast.  DECREASE suppertime Humalin 70/30 insulin to 20 units.  Follow up 4 weeks.

## 2015-04-20 NOTE — Assessment & Plan Note (Signed)
BG improved, however having some lows in the night and mornings. Will decrease suppertime insulin to 20units. Continue breakfast 27units. Follow up in 4 weeks and prn.

## 2015-04-30 ENCOUNTER — Other Ambulatory Visit: Payer: Self-pay | Admitting: *Deleted

## 2015-04-30 ENCOUNTER — Ambulatory Visit: Payer: Medicare Other | Admitting: *Deleted

## 2015-04-30 NOTE — Patient Outreach (Signed)
I called Mrs. Lawal to advise her that her care manager is out on medical leave for 2 weeks and will not be out for the scheduled appt this afternoon. I told her Kalman Shan would reschedule with her when she returns to work. I asked how she is doing and she said she is making progress on her diabetes management. Her FBS today was 135 and she was very proud of this. I encouraged her to follow Rose's recommendations.  Deloria Lair [facility] [place] [phone]

## 2015-05-11 ENCOUNTER — Other Ambulatory Visit: Payer: Self-pay

## 2015-05-11 ENCOUNTER — Other Ambulatory Visit: Payer: Self-pay | Admitting: Internal Medicine

## 2015-05-11 DIAGNOSIS — E1129 Type 2 diabetes mellitus with other diabetic kidney complication: Secondary | ICD-10-CM

## 2015-05-11 MED ORDER — GLUCOSE BLOOD VI STRP: ORAL_STRIP | Status: DC

## 2015-05-11 MED ORDER — INSULIN ISOPHANE & REGULAR (HUMAN 70-30)100 UNIT/ML KWIKPEN: PEN_INJECTOR | SUBCUTANEOUS | Status: DC

## 2015-05-11 MED ORDER — PRODIGY AUTOCODE BLOOD GLUCOSE DEVI: Status: DC

## 2015-05-11 MED ORDER — BAYER BREEZE 2 TEST VI DISK: DISK | Status: DC

## 2015-05-16 DIAGNOSIS — Z961 Presence of intraocular lens: Secondary | ICD-10-CM | POA: Diagnosis not present

## 2015-05-16 LAB — HM DIABETES EYE EXAM

## 2015-05-18 ENCOUNTER — Encounter: Payer: Self-pay | Admitting: Internal Medicine

## 2015-05-25 ENCOUNTER — Telehealth: Payer: Self-pay | Admitting: Internal Medicine

## 2015-05-25 ENCOUNTER — Other Ambulatory Visit: Payer: Self-pay | Admitting: *Deleted

## 2015-05-25 NOTE — Telephone Encounter (Signed)
Mickie Bail called from [facility] care mangement regarding pt blood sugar is increasing. Rose number is  [phone]. Thank You!

## 2015-05-25 NOTE — Patient Outreach (Signed)
Called Dr. Thomes Dinning office, spoke with Elvina Mattes- message to be left with  MD's nurse that pt's blood sugars are increasing. RN CM provided contact number.       [name]   [place] Care Management  [phone]

## 2015-05-28 ENCOUNTER — Encounter: Payer: Self-pay | Admitting: *Deleted

## 2015-05-28 NOTE — Patient Outreach (Signed)
[place] [facility]) Care Management   Home visit 05/25/15  [name] [name] [birth year] [id number]  [name] [name] is an 81 y.o. female  Subjective:  Pt reports f/u with MD (Dr. Gilford Rile) since last seeing RN CM, MD changed  her pm insulin to 20 units because of having low am blood sugars.  Pt states one time it was 54.  Pt states her blood sugar this am was 376, 393 yesterday.     Objective:   ROS  Physical Exam  Constitutional: She is oriented to person, place, and time. She appears well-developed and well-nourished.  Cardiovascular: Normal rate and regular rhythm.   Respiratory: Breath sounds normal.  GI: Soft.  Musculoskeletal: Normal range of motion.  Neurological: She is alert and oriented to person, place, and time.  Skin: Skin is warm and dry.  Psychiatric: She has a normal mood and affect. Her behavior is normal. Judgment and thought content normal.    Current Medications:  Reviewed with pt  Current Outpatient Prescriptions  Medication Sig Dispense Refill  . ALPRAZolam (XANAX) 0.5 MG tablet Take 1 tablet (0.5 mg total) by mouth 2 (two) times daily as needed for anxiety. 60 tablet 3  . Blood Glucose Monitoring Suppl DEVI by Does not apply route. Accucheck machine    . Insulin Isophane & Regular Human (HUMULIN 70/30 PEN) (70-30) 100 UNIT/ML PEN 27units with breakfast and 20units with supper 15 mL 6  . levothyroxine (SYNTHROID, LEVOTHROID) 88 MCG tablet Take 1 tablet (88 mcg total) by mouth daily. 90 tablet 3  . pantoprazole (PROTONIX) 40 MG tablet TAKE 1 TABLET BY MOUTH TWICE DAILY 180 tablet 1  . BAYER BREEZE 2 TEST DISK TEST SIX TIMES A DAY (Patient not taking: Reported on 05/25/2015) 600 each 1  . Blood Glucose Monitoring Suppl (PRODIGY AUTOCODE BLOOD GLUCOSE) DEVI Use as directed 4 to 6 times daily Dx: 250.02 (Patient not taking: Reported on 05/25/2015) 1 each 0  . diphenhydrAMINE (BENADRYL) 25 MG tablet Take 1 tablet (25 mg total) by mouth every 8 (eight) hours  as needed. (Patient not taking: Reported on 05/25/2015) 30 tablet 0  . PRODIGY NO CODING BLOOD GLUC test strip USE AS DIRECTED FOUR TIMES DAILY (Patient not taking: Reported on 05/25/2015) 300 each 3   No current facility-administered medications for this visit.       Fall/Depression Screening:    PHQ 2/9 Scores 08/15/2014 07/26/2012 06/15/2012  PHQ - 2 Score 0 0 3    Assessment:  DM-  Pt reports blood sugar this am was 376, ate at 11 am (oatmeal,toast, coffee, small glass of Orange juice), took am insulin.  Had pt recheck blood sugar (3 hours after eating- result was 389).  View of pt's glucometer readings= averages 7 day- 364, 14 day-336, 30 day- 298.   RN CM to call Dr. Thomes Dinning office- report blood sugars increasing.    Plan:  RN CM called Dr. Thomes Dinning office, spoke with Elvina Mattes- MD's nurse busy, voice message to be left with nurse that pt's sugars are increasing, RN CM's contact number provided.             As discussed, pt  to continue to check blood sugars 1-2 times daily, call MD for high readings.           Pt to f/u with Dr. Gilford Rile 11/30.             RN CM to continue to provide pt with community nurse case management services, next  h/v 11/28.    THN CM Care Plan Problem Three        Most Recent Value   Care Plan Problem Three  elevated blood sugars- recent A1C 11    Role Documenting the Problem Three  Care Management Coordinator   Care Plan for Problem Three  Active   THN Long Term Goal (31-90) days  Pt's A!C would come down 1-2 points in the next 60 days    THN Long Term Goal Start Date  05/25/15   Interventions for Problem Three Long Term Goal  Reinforced with pt importance of checking blood sugars 1-2 times daily, call MD for high readings.    THN CM Short Term Goal #2 (0-30 days)  Pt's blood sugars would be <250  in the next 30 days    THN CM Short Term Goal #2 Start Date  05/25/15   Interventions for Short Term Goal #2  Discussed with pt to call MD if sugars are  elevated    THN CM Short Term Goal #3 (0-30 days)  Pt wouild not have any low blood sugars in the next 30 days    THN  CM Short Term Goal #3 Start Date  03/29/15   THN CM Short Term Goal #3 Met Date  -- [not met- low blood sugar reading last month,insulin adjusted]   Interventions for Short Term Goal #3  Discussed with pt to switch from peanut butter to  1/2 sandwich with protein at night      [name] [name].   [place] Care Management  [phone]

## 2015-05-29 ENCOUNTER — Telehealth: Payer: Self-pay | Admitting: Internal Medicine

## 2015-05-29 NOTE — Telephone Encounter (Signed)
Left message for Kalman Shan, advising her she can call the office for assistance

## 2015-05-29 NOTE — Telephone Encounter (Signed)
Rose called from [facility] is returning your call regarding pt.  Rose number is [phone]. Thank You!

## 2015-05-30 ENCOUNTER — Other Ambulatory Visit: Payer: Self-pay | Admitting: *Deleted

## 2015-05-30 NOTE — Patient Outreach (Signed)
Late entry for 11/1-  Received a voice message from Julie(Dr. Thomes Dinning office)returning RN CM's call  from 10/28  Almyra Free requesting a call back.    11/1- 3:56 pm = RN CM called Dr. Thomes Dinning office in an attempt to return call from Almyra Free- message to be left with Almyra Free to return RN CM's call.       [name]   [place] Care Management  [phone]

## 2015-06-01 ENCOUNTER — Ambulatory Visit: Payer: Medicare Other | Admitting: Internal Medicine

## 2015-06-08 ENCOUNTER — Emergency Department: Payer: Medicare Other

## 2015-06-08 ENCOUNTER — Inpatient Hospital Stay
Admission: EM | Admit: 2015-06-08 | Discharge: 2015-06-11 | DRG: 637 | Disposition: A | Discharge status: Skilled Nursing Facility | Payer: Medicare Other | Attending: Internal Medicine | Admitting: Internal Medicine

## 2015-06-08 DIAGNOSIS — M79672 Pain in left foot: Secondary | ICD-10-CM | POA: Diagnosis not present

## 2015-06-08 DIAGNOSIS — J449 Chronic obstructive pulmonary disease, unspecified: Secondary | ICD-10-CM | POA: Diagnosis present

## 2015-06-08 DIAGNOSIS — Z79899 Other long term (current) drug therapy: Secondary | ICD-10-CM

## 2015-06-08 DIAGNOSIS — I248 Other forms of acute ischemic heart disease: Secondary | ICD-10-CM | POA: Diagnosis present

## 2015-06-08 DIAGNOSIS — E875 Hyperkalemia: Secondary | ICD-10-CM | POA: Diagnosis present

## 2015-06-08 DIAGNOSIS — E870 Hyperosmolality and hypernatremia: Secondary | ICD-10-CM | POA: Diagnosis present

## 2015-06-08 DIAGNOSIS — H9190 Unspecified hearing loss, unspecified ear: Secondary | ICD-10-CM | POA: Diagnosis present

## 2015-06-08 DIAGNOSIS — Z880 Allergy status to penicillin: Secondary | ICD-10-CM

## 2015-06-08 DIAGNOSIS — E1169 Type 2 diabetes mellitus with other specified complication: Secondary | ICD-10-CM | POA: Diagnosis not present

## 2015-06-08 DIAGNOSIS — Z9889 Other specified postprocedural states: Secondary | ICD-10-CM | POA: Diagnosis not present

## 2015-06-08 DIAGNOSIS — E0810 Diabetes mellitus due to underlying condition with ketoacidosis without coma: Secondary | ICD-10-CM

## 2015-06-08 DIAGNOSIS — E871 Hypo-osmolality and hyponatremia: Secondary | ICD-10-CM | POA: Diagnosis not present

## 2015-06-08 DIAGNOSIS — F419 Anxiety disorder, unspecified: Secondary | ICD-10-CM | POA: Diagnosis not present

## 2015-06-08 DIAGNOSIS — N179 Acute kidney failure, unspecified: Secondary | ICD-10-CM | POA: Diagnosis present

## 2015-06-08 DIAGNOSIS — Z794 Long term (current) use of insulin: Secondary | ICD-10-CM

## 2015-06-08 DIAGNOSIS — K219 Gastro-esophageal reflux disease without esophagitis: Secondary | ICD-10-CM | POA: Diagnosis not present

## 2015-06-08 DIAGNOSIS — E785 Hyperlipidemia, unspecified: Secondary | ICD-10-CM | POA: Diagnosis not present

## 2015-06-08 DIAGNOSIS — R7989 Other specified abnormal findings of blood chemistry: Secondary | ICD-10-CM

## 2015-06-08 DIAGNOSIS — Z90710 Acquired absence of both cervix and uterus: Secondary | ICD-10-CM | POA: Diagnosis not present

## 2015-06-08 DIAGNOSIS — R739 Hyperglycemia, unspecified: Secondary | ICD-10-CM | POA: Diagnosis not present

## 2015-06-08 DIAGNOSIS — K589 Irritable bowel syndrome without diarrhea: Secondary | ICD-10-CM | POA: Diagnosis present

## 2015-06-08 DIAGNOSIS — E1165 Type 2 diabetes mellitus with hyperglycemia: Secondary | ICD-10-CM

## 2015-06-08 DIAGNOSIS — I252 Old myocardial infarction: Secondary | ICD-10-CM

## 2015-06-08 DIAGNOSIS — M6281 Muscle weakness (generalized): Secondary | ICD-10-CM | POA: Diagnosis not present

## 2015-06-08 DIAGNOSIS — R109 Unspecified abdominal pain: Secondary | ICD-10-CM | POA: Diagnosis present

## 2015-06-08 DIAGNOSIS — I10 Essential (primary) hypertension: Secondary | ICD-10-CM | POA: Diagnosis present

## 2015-06-08 DIAGNOSIS — Z853 Personal history of malignant neoplasm of breast: Secondary | ICD-10-CM | POA: Diagnosis not present

## 2015-06-08 DIAGNOSIS — D72829 Elevated white blood cell count, unspecified: Secondary | ICD-10-CM | POA: Diagnosis not present

## 2015-06-08 DIAGNOSIS — G4733 Obstructive sleep apnea (adult) (pediatric): Secondary | ICD-10-CM | POA: Diagnosis present

## 2015-06-08 DIAGNOSIS — R112 Nausea with vomiting, unspecified: Secondary | ICD-10-CM | POA: Diagnosis not present

## 2015-06-08 DIAGNOSIS — M810 Age-related osteoporosis without current pathological fracture: Secondary | ICD-10-CM | POA: Diagnosis present

## 2015-06-08 DIAGNOSIS — R4182 Altered mental status, unspecified: Secondary | ICD-10-CM

## 2015-06-08 DIAGNOSIS — I959 Hypotension, unspecified: Secondary | ICD-10-CM | POA: Diagnosis not present

## 2015-06-08 DIAGNOSIS — IMO0002 Reserved for concepts with insufficient information to code with codable children: Secondary | ICD-10-CM

## 2015-06-08 DIAGNOSIS — N183 Chronic kidney disease, stage 3 (moderate): Secondary | ICD-10-CM | POA: Diagnosis not present

## 2015-06-08 DIAGNOSIS — E860 Dehydration: Secondary | ICD-10-CM | POA: Diagnosis present

## 2015-06-08 DIAGNOSIS — Z8249 Family history of ischemic heart disease and other diseases of the circulatory system: Secondary | ICD-10-CM | POA: Diagnosis not present

## 2015-06-08 DIAGNOSIS — E1310 Other specified diabetes mellitus with ketoacidosis without coma: Secondary | ICD-10-CM | POA: Diagnosis present

## 2015-06-08 DIAGNOSIS — E039 Hypothyroidism, unspecified: Secondary | ICD-10-CM | POA: Diagnosis not present

## 2015-06-08 DIAGNOSIS — E119 Type 2 diabetes mellitus without complications: Secondary | ICD-10-CM | POA: Diagnosis not present

## 2015-06-08 DIAGNOSIS — E872 Acidosis: Secondary | ICD-10-CM | POA: Diagnosis present

## 2015-06-08 DIAGNOSIS — I2510 Atherosclerotic heart disease of native coronary artery without angina pectoris: Secondary | ICD-10-CM | POA: Diagnosis present

## 2015-06-08 DIAGNOSIS — E1151 Type 2 diabetes mellitus with diabetic peripheral angiopathy without gangrene: Secondary | ICD-10-CM

## 2015-06-08 DIAGNOSIS — Z833 Family history of diabetes mellitus: Secondary | ICD-10-CM

## 2015-06-08 DIAGNOSIS — E1110 Type 2 diabetes mellitus with ketoacidosis without coma: Secondary | ICD-10-CM | POA: Diagnosis present

## 2015-06-08 DIAGNOSIS — R778 Other specified abnormalities of plasma proteins: Secondary | ICD-10-CM

## 2015-06-08 DIAGNOSIS — M79671 Pain in right foot: Secondary | ICD-10-CM | POA: Diagnosis not present

## 2015-06-08 DIAGNOSIS — R32 Unspecified urinary incontinence: Secondary | ICD-10-CM | POA: Diagnosis not present

## 2015-06-08 DIAGNOSIS — E1122 Type 2 diabetes mellitus with diabetic chronic kidney disease: Secondary | ICD-10-CM | POA: Diagnosis not present

## 2015-06-08 DIAGNOSIS — G9341 Metabolic encephalopathy: Secondary | ICD-10-CM | POA: Diagnosis present

## 2015-06-08 DIAGNOSIS — Z8542 Personal history of malignant neoplasm of other parts of uterus: Secondary | ICD-10-CM

## 2015-06-08 DIAGNOSIS — R7309 Other abnormal glucose: Secondary | ICD-10-CM | POA: Diagnosis not present

## 2015-06-08 LAB — BASIC METABOLIC PANEL
ANION GAP: 23 — AB (ref 5–15)
ANION GAP: 16 — AB (ref 5–15)
BUN: 48 mg/dL — AB (ref 6–20)
BUN: 44 mg/dL — ABNORMAL HIGH (ref 6–20)
BUN: 42 mg/dL — ABNORMAL HIGH (ref 6–20)
CALCIUM: 8.3 mg/dL — AB (ref 8.9–10.3)
CALCIUM: 8.5 mg/dL — AB (ref 8.9–10.3)
CHLORIDE: 98 mmol/L — AB (ref 101–111)
CO2: <5 mmol/L — ABNORMAL LOW (ref 22–32)
CO2: 10 mmol/L — ABNORMAL LOW (ref 22–32)
CO2: 13 mmol/L — ABNORMAL LOW (ref 22–32)
CREATININE: 2.32 mg/dL — AB (ref 0.44–1.00)
Calcium: 7.8 mg/dL — ABNORMAL LOW (ref 8.9–10.3)
Chloride: 106 mmol/L (ref 101–111)
Chloride: 110 mmol/L (ref 101–111)
Creatinine, Ser: 2.18 mg/dL — ABNORMAL HIGH (ref 0.44–1.00)
Creatinine, Ser: 1.89 mg/dL — ABNORMAL HIGH (ref 0.44–1.00)
GFR, EST AFRICAN AMERICAN: 22 mL/min — AB (ref >60)
GFR, EST AFRICAN AMERICAN: 23 mL/min — AB (ref >60)
GFR, EST AFRICAN AMERICAN: 28 mL/min — AB (ref >60)
GFR, EST NON AFRICAN AMERICAN: 19 mL/min — AB (ref >60)
GFR, EST NON AFRICAN AMERICAN: 20 mL/min — AB (ref >60)
GFR, EST NON AFRICAN AMERICAN: 24 mL/min — AB (ref >60)
GLUCOSE: 596 mg/dL — AB (ref 65–99)
GLUCOSE: 396 mg/dL — AB (ref 65–99)
Glucose, Bld: 1100 mg/dL (ref 65–99)
POTASSIUM: 3.8 mmol/L (ref 3.5–5.1)
POTASSIUM: 3.7 mmol/L (ref 3.5–5.1)
Potassium: 5.5 mmol/L — ABNORMAL HIGH (ref 3.5–5.1)
SODIUM: 132 mmol/L — AB (ref 135–145)
Sodium: 139 mmol/L (ref 135–145)
Sodium: 139 mmol/L (ref 135–145)

## 2015-06-08 LAB — GLUCOSE, CAPILLARY
GLUCOSE-CAPILLARY: 392 mg/dL — AB (ref 65–99)
Glucose-Capillary: 322 mg/dL — ABNORMAL HIGH (ref 65–99)
Glucose-Capillary: 225 mg/dL — ABNORMAL HIGH (ref 65–99)

## 2015-06-08 LAB — BLOOD GAS, VENOUS
Acid-base deficit: 22.3 mmol/L — ABNORMAL HIGH (ref 0.0–2.0)
BICARBONATE: 7 meq/L — AB (ref 21.0–28.0)
Patient temperature: 37
pCO2, Ven: 26 mmHg — ABNORMAL LOW (ref 44.0–60.0)
pH, Ven: 7.04 — CL (ref 7.320–7.430)

## 2015-06-08 LAB — CBC WITH DIFFERENTIAL/PLATELET
Basophils Absolute: 0.1 10*3/uL (ref 0–0.1)
Basophils Relative: 0 %
Eosinophils Absolute: 0 10*3/uL (ref 0–0.7)
Eosinophils Relative: 0 %
HEMATOCRIT: 40.3 % (ref 35.0–47.0)
HEMOGLOBIN: 11.9 g/dL — AB (ref 12.0–16.0)
LYMPHS ABS: 0.9 10*3/uL — AB (ref 1.0–3.6)
MCH: 31.9 pg (ref 26.0–34.0)
MCHC: 29.5 g/dL — AB (ref 32.0–36.0)
MCV: 108.4 fL — AB (ref 80.0–100.0)
MONO ABS: 0.8 10*3/uL (ref 0.2–0.9)
NEUTROS ABS: 16.4 10*3/uL — AB (ref 1.4–6.5)
Platelets: 300 10*3/uL (ref 150–440)
RBC: 3.72 MIL/uL — ABNORMAL LOW (ref 3.80–5.20)
RDW: 15.4 % — AB (ref 11.5–14.5)
WBC: 18.2 10*3/uL — ABNORMAL HIGH (ref 3.6–11.0)

## 2015-06-08 LAB — URINALYSIS COMPLETE WITH MICROSCOPIC (ARMC ONLY)
Bilirubin Urine: NEGATIVE
Glucose, UA: >500 mg/dL — AB
Hgb urine dipstick: NEGATIVE
LEUKOCYTES UA: NEGATIVE
NITRITE: NEGATIVE
PH: 5 (ref 5.0–8.0)
PROTEIN: NEGATIVE mg/dL
SPECIFIC GRAVITY, URINE: 1.02 (ref 1.005–1.030)

## 2015-06-08 LAB — TSH: TSH: 3.186 u[IU]/mL (ref 0.350–4.500)

## 2015-06-08 LAB — LACTIC ACID, PLASMA
LACTIC ACID, VENOUS: 4.5 mmol/L — AB (ref 0.5–2.0)
Lactic Acid, Venous: 4.9 mmol/L (ref 0.5–2.0)

## 2015-06-08 LAB — COMPREHENSIVE METABOLIC PANEL
ALBUMIN: 4.1 g/dL (ref 3.5–5.0)
ALT: 23 U/L (ref 14–54)
ANION GAP: 33 — AB (ref 5–15)
AST: 24 U/L (ref 15–41)
Alkaline Phosphatase: 131 U/L — ABNORMAL HIGH (ref 38–126)
BUN: 47 mg/dL — ABNORMAL HIGH (ref 6–20)
CO2: 8 mmol/L — AB (ref 22–32)
Calcium: 9.2 mg/dL (ref 8.9–10.3)
Chloride: 87 mmol/L — ABNORMAL LOW (ref 101–111)
Creatinine, Ser: 2.56 mg/dL — ABNORMAL HIGH (ref 0.44–1.00)
GFR calc Af Amer: 19 mL/min — ABNORMAL LOW (ref >60)
GFR calc non Af Amer: 16 mL/min — ABNORMAL LOW (ref >60)
GLUCOSE: 1186 mg/dL — AB (ref 65–99)
POTASSIUM: 6.2 mmol/L — AB (ref 3.5–5.1)
SODIUM: 128 mmol/L — AB (ref 135–145)
Total Bilirubin: 2 mg/dL — ABNORMAL HIGH (ref 0.3–1.2)
Total Protein: 8 g/dL (ref 6.5–8.1)

## 2015-06-08 LAB — T4, FREE: FREE T4: 1.25 ng/dL — AB (ref 0.61–1.12)

## 2015-06-08 LAB — MRSA PCR SCREENING: MRSA BY PCR: NEGATIVE

## 2015-06-08 LAB — TROPONIN I: Troponin I: 0.07 ng/mL — ABNORMAL HIGH (ref <0.031)

## 2015-06-08 LAB — LIPASE, BLOOD: Lipase: 26 U/L (ref 11–51)

## 2015-06-08 MED ORDER — ONDANSETRON HCL 4 MG PO TABS
4.0000 mg | ORAL_TABLET | Freq: Four times a day (QID) | ORAL | Status: DC | PRN
Start: 2015-06-08 — End: 2015-06-11

## 2015-06-08 MED ORDER — DEXTROSE 5 % IV SOLN
2.0000 g | Freq: Once | INTRAVENOUS | Status: AC
  Administered 2015-06-08: 2 g via INTRAVENOUS
  Filled 2015-06-08: qty 2

## 2015-06-08 MED ORDER — ACETAMINOPHEN 325 MG PO TABS: 650.0000 mg | ORAL_TABLET | Freq: Four times a day (QID) | ORAL | Status: DC | PRN

## 2015-06-08 MED ORDER — INSULIN REGULAR HUMAN 100 UNIT/ML IJ SOLN: INTRAMUSCULAR | Status: DC

## 2015-06-08 MED ORDER — DEXTROSE-NACL 5-0.45 % IV SOLN: INTRAVENOUS | Status: DC

## 2015-06-08 MED ORDER — ONDANSETRON HCL 4 MG/2ML IJ SOLN
4.0000 mg | Freq: Once | INTRAMUSCULAR | Status: AC
  Administered 2015-06-08: 4 mg via INTRAVENOUS
  Filled 2015-06-08: qty 2

## 2015-06-08 MED ORDER — SODIUM CHLORIDE 0.9 % IV BOLUS (SEPSIS)
1000.0000 mL | Freq: Once | INTRAVENOUS | Status: AC
  Administered 2015-06-08: 1000 mL via INTRAVENOUS

## 2015-06-08 MED ORDER — LEVOTHYROXINE SODIUM 88 MCG PO TABS
88.0000 ug | ORAL_TABLET | Freq: Every day | ORAL | Status: DC
  Administered 2015-06-10 – 2015-06-11 (×2): 88 ug via ORAL
  Filled 2015-06-08 (×6): qty 1

## 2015-06-08 MED ORDER — SODIUM CHLORIDE 0.9 % IV SOLN
INTRAVENOUS | Status: DC
  Administered 2015-06-10: 07:00:00 via INTRAVENOUS

## 2015-06-08 MED ORDER — SODIUM CHLORIDE 0.9 % IV BOLUS (SEPSIS)
1000.0000 mL | Freq: Once | INTRAVENOUS | Status: AC
Start: 2015-06-08 — End: 2015-06-08
  Administered 2015-06-08: 1000 mL via INTRAVENOUS

## 2015-06-08 MED ORDER — SODIUM CHLORIDE 0.9 % IV SOLN
INTRAVENOUS | Status: DC
  Administered 2015-06-08 – 2015-06-09 (×2): via INTRAVENOUS

## 2015-06-08 MED ORDER — IOHEXOL 240 MG/ML SOLN
25.0000 mL | Freq: Once | INTRAMUSCULAR | Status: AC | PRN
  Administered 2015-06-08: 25 mL via ORAL

## 2015-06-08 MED ORDER — HEPARIN SODIUM (PORCINE) 5000 UNIT/ML IJ SOLN
5000.0000 [IU] | Freq: Three times a day (TID) | INTRAMUSCULAR | Status: DC
  Administered 2015-06-08 – 2015-06-11 (×9): 5000 [IU] via SUBCUTANEOUS
  Filled 2015-06-08 (×9): qty 1

## 2015-06-08 MED ORDER — SODIUM CHLORIDE 0.9 % IV SOLN
INTRAVENOUS | Status: DC
  Administered 2015-06-08: 13.1 [IU]/h via INTRAVENOUS
  Administered 2015-06-08: 10.8 [IU]/h via INTRAVENOUS
  Administered 2015-06-09: 0.2 [IU]/h via INTRAVENOUS
  Administered 2015-06-09: 1.6 [IU]/h via INTRAVENOUS
  Filled 2015-06-08: qty 2.5

## 2015-06-08 MED ORDER — SODIUM CHLORIDE 0.9 % IV SOLN: INTRAVENOUS | Status: AC

## 2015-06-08 MED ORDER — ONDANSETRON HCL 4 MG/2ML IJ SOLN: 4.0000 mg | Freq: Four times a day (QID) | INTRAMUSCULAR | Status: DC | PRN

## 2015-06-08 MED ORDER — ASPIRIN 300 MG RE SUPP
300.0000 mg | Freq: Once | RECTAL | Status: AC
  Administered 2015-06-08: 300 mg via RECTAL
  Filled 2015-06-08: qty 1

## 2015-06-08 MED ORDER — ACETAMINOPHEN 650 MG RE SUPP: 650.0000 mg | Freq: Four times a day (QID) | RECTAL | Status: DC | PRN

## 2015-06-08 MED ORDER — INSULIN REGULAR HUMAN 100 UNIT/ML IJ SOLN
INTRAMUSCULAR | Status: DC
  Filled 2015-06-08: qty 2.5

## 2015-06-08 MED ORDER — DEXTROSE-NACL 5-0.45 % IV SOLN
INTRAVENOUS | Status: DC
  Administered 2015-06-08 – 2015-06-09 (×2): via INTRAVENOUS

## 2015-06-08 MED ORDER — PANTOPRAZOLE SODIUM 40 MG PO TBEC
40.0000 mg | DELAYED_RELEASE_TABLET | Freq: Two times a day (BID) | ORAL | Status: DC
  Administered 2015-06-08 – 2015-06-11 (×5): 40 mg via ORAL
  Filled 2015-06-08 (×5): qty 1

## 2015-06-08 MED ORDER — VANCOMYCIN HCL IN DEXTROSE 1-5 GM/200ML-% IV SOLN
1000.0000 mg | Freq: Once | INTRAVENOUS | Status: AC
  Administered 2015-06-08: 1000 mg via INTRAVENOUS
  Filled 2015-06-08: qty 200

## 2015-06-08 MED ORDER — SODIUM CHLORIDE 0.9 % IJ SOLN
3.0000 mL | Freq: Two times a day (BID) | INTRAMUSCULAR | Status: DC
  Administered 2015-06-08 – 2015-06-11 (×6): 3 mL via INTRAVENOUS

## 2015-06-08 NOTE — H&P (Signed)
[facility] NAME: [name] [name]    MR#:  [id number]  DATE OF BIRTH:  [birth year]  DATE OF ADMISSION:  06/08/2015  PRIMARY CARE PHYSICIAN: Rica Mast, MD   REQUESTING/REFERRING PHYSICIAN: Dr. Darrick Penna  CHIEF COMPLAINT:   Chief Complaint  Patient presents with  . Emesis  . Hyperglycemia   diabetic ketoacidosis  HISTORY OF PRESENT ILLNESS:  [name] [name]  is a 81 y.o. female with a known history of diabetes type 2 insulin-dependent, history of breast cancer, obstructive sleep apnea, osteoporosis, hypothyroidism, hyperlipidemia, anxiety, history of chronic disease, history of GERD, who presented to the hospital due to altered mental status and persistent nausea and vomiting. She herself is a poor historian therefore most of the history obtained from the family at bedside. As per the family patient has been having persistent nausea vomiting now for the past 2 days. Since she was not improving EMS was called and when they presented to the hospital she was noted to be severely hyperglycemic. She was sent to the ER for further evaluation and noted to be in acute diabetic ketoacidosis. Hospitalist services were contacted further treatment and evaluation. As per the family patient has apparently been out of her insulin for 2 days.  Patient denies any fever, chills, abdominal pain, dysuria, hematuria or any other associated symptoms presently.  PAST MEDICAL HISTORY:   Past Medical History  Diagnosis Date  . Diabetes mellitus   . Cancer [facility])     2004 Right breast, found on mammogram, radiation therapy, Dr. Bryson Ha, uterine cancer,   . OSA (obstructive sleep apnea)   . Osteoporosis 02/21/09    DEXA scan showed osteoporosis with left femur T-score -2.8.  Marland Kitchen Thyroid disease     Hypothyroid  . Hyperlipidemia   . CAD (coronary artery disease)   . Anxiety state, unspecified   . Vitamin D deficiency   . Esophageal reflux    . Myocardial infarction [facility])     Cath negative except for 40% occlusion LAD.  Pt not candidate for betablocker or ACEI because of hypotension  . Hypotension   . Presbyacusis   . COPD (chronic obstructive pulmonary disease) ([place])   . Hearing loss   . IBS (irritable bowel syndrome)   . Heart burn     PAST SURGICAL HISTORY:   Past Surgical History  Procedure Laterality Date  . Abdominal hysterectomy      uterine cancer  . Breast surgery    . Colon surgery  2013    done at [place]:   Social History  Substance Use Topics  . Smoking status: Never Smoker   . Smokeless tobacco: Never Used  . Alcohol Use: No    FAMILY HISTORY:   Family History  Problem Relation Age of Onset  . Diabetes Mother   . Heart attack Father     DRUG ALLERGIES:   Allergies  Allergen Reactions  . Penicillins Other (See Comments)    Reaction:  Unknown    REVIEW OF SYSTEMS:   Review of Systems  Constitutional: Positive for malaise/fatigue. Negative for fever and weight loss.  HENT: Negative for congestion, nosebleeds and tinnitus.   Eyes: Negative for blurred vision, double vision and redness.  Respiratory: Negative for cough, hemoptysis and shortness of breath.   Cardiovascular: Negative for chest pain, orthopnea, leg swelling and PND.  Gastrointestinal: Positive for nausea and vomiting. Negative for abdominal pain, diarrhea and melena.  Genitourinary: Negative  for dysuria, urgency and hematuria.  Musculoskeletal: Negative for joint pain and falls.  Neurological: Positive for weakness (generalized). Negative for dizziness, tingling, sensory change, focal weakness, seizures and headaches.  Endo/Heme/Allergies: Negative for polydipsia. Does not bruise/bleed easily.  Psychiatric/Behavioral: Negative for depression and memory loss. The patient is not nervous/anxious.     MEDICATIONS AT HOME:   Prior to Admission medications   Medication Sig Start Date End Date Taking?  Authorizing Provider  insulin NPH-regular Human (HUMULIN 70/30) (70-30) 100 UNIT/ML injection Inject 20-27 Units into the skin 2 (two) times daily. Pt uses 27 units with breakfast and 20 units with supper.   Yes Historical Provider, MD  levothyroxine (SYNTHROID, LEVOTHROID) 88 MCG tablet Take 1 tablet (88 mcg total) by mouth daily. 03/16/15  Yes Jackolyn Confer, MD  pantoprazole (PROTONIX) 40 MG tablet Take 40 mg by mouth 2 (two) times daily.   Yes Historical Provider, MD  diphenhydrAMINE (BENADRYL) 25 MG tablet Take 1 tablet (25 mg total) by mouth every 8 (eight) hours as needed. Patient not taking: Reported on 05/25/2015 03/15/15   Jackolyn Confer, MD      VITAL SIGNS:  Blood pressure 97/45, pulse 120, temperature 97.4 F (36.3 C), temperature source Rectal, resp. rate 22, height 5\' 6"  (1.676 m), weight 71.668 kg (158 lb), SpO2 100 %.  PHYSICAL EXAMINATION:  Physical Exam  GENERAL:  81 y.o.-year-old patient lying in the bed confused but in no apparent distress.  EYES: Pupils equal, round, reactive to light and accommodation. No scleral icterus. Extraocular muscles intact.  HEENT: Head atraumatic, normocephalic. Oropharynx and nasopharynx clear. No oropharyngeal erythema, dry oral mucosa  NECK:  Supple, no jugular venous distention. No thyroid enlargement, no tenderness.  LUNGS: Normal breath sounds bilaterally, no wheezing, rales, rhonchi. No use of accessory muscles of respiration.  CARDIOVASCULAR: S1, S2 RRR, tachycardic. No murmurs, rubs, gallops, clicks.  ABDOMEN: Soft, nontender, nondistended. Bowel sounds present. No organomegaly or mass.  EXTREMITIES: No pedal edema, cyanosis, or clubbing. + 2 pedal & radial pulses b/l.   NEUROLOGIC: Cranial nerves II through XII are intact. No focal Motor or sensory deficits appreciated b/l PSYCHIATRIC: The patient is alert and oriented x 2. Good affect.  SKIN: No obvious rash, lesion, or ulcer.   LABORATORY PANEL:   CBC  Recent Labs Lab  06/08/15 1231  WBC 18.2*  HGB 11.9*  HCT 40.3  PLT 300   ------------------------------------------------------------------------------------------------------------------  Chemistries   Recent Labs Lab 06/08/15 1231  NA 128*  K 6.2*  CL 87*  CO2 8*  GLUCOSE 1186*  BUN 47*  CREATININE 2.56*  CALCIUM 9.2  AST 24  ALT 23  ALKPHOS 131*  BILITOT 2.0*   ------------------------------------------------------------------------------------------------------------------  Cardiac Enzymes  Recent Labs Lab 06/08/15 1231  TROPONINI 0.07*   ------------------------------------------------------------------------------------------------------------------  RADIOLOGY:  Ct Abdomen Pelvis Wo Contrast  06/08/2015  CLINICAL DATA:  Nausea, vomiting and abdominal pain for 2 days EXAM: CT ABDOMEN AND PELVIS WITHOUT CONTRAST TECHNIQUE: Multidetector CT imaging of the abdomen and pelvis was performed following the standard protocol without IV contrast. COMPARISON:  03/21/2013 FINDINGS: The lung bases demonstrate some scarring bilaterally. The images are significantly degraded by motion artifact. The liver, spleen, adrenal glands and pancreas are within normal limits. The gallbladder is within normal limits although again limited by motion artifact. The kidneys demonstrate no evidence of renal calculi or obstructive changes. Fecal material is seen within the colon. No definitive obstructive changes are seen. The bladder is within normal limits. Aortoiliac calcifications are  seen without aneurysmal dilatation. The osseous structures show no acute abnormality. IMPRESSION: Somewhat limited exam although no acute abnormality is noted. Electronically Signed   By: Inez Catalina M.D.   On: 06/08/2015 15:38   Dg Chest Portable 1 View  06/08/2015  CLINICAL DATA:  Hyperglycemia with nausea and vomiting EXAM: PORTABLE CHEST 1 VIEW COMPARISON:  Dec 26, 2014 FINDINGS: There is no edema or consolidation. Heart  size and pulmonary vascularity are normal. No adenopathy. No bone lesions. IMPRESSION: No edema or consolidation. Electronically Signed   By: Lowella Grip III M.D.   On: 06/08/2015 13:05     IMPRESSION AND PLAN:   81 year old female with past medical history of diabetes, hypertension, GERD, history of previous MI, hypothyroidism, osteoporosis, irritable bowel syndrome, who presented to the hospital due to altered mental status and noted to be in acute diabetic ketoacidosis.  #1 acute diabetic ketoacidosis-this is likely secondary to patient not taking her insulin for the past 2 days. -I do not see any evidence of acute infectious source to exacerbate her hyperglycemia. Her urinalysis is negative, her chest x-ray is negative.  Patient has been empirically given IV vancomycin, Aztreonam  by the ER physician but I will not order any further antibiotics at this time. -I will place on insulin drip and DKA protocol, IV fluids, antiemetics. -Follow serial metabolic profiles. Add potassium to her fluids if her potassium is less than 4.5. Follow metabolic profiles until anion gap closes. -Check a hemoglobin A1c.  #2 acute renal failure-this is likely in setting of DKA and dehydration. -I will aggressively hydrated with IV fluids and follow BUN/creatinine.  #3 hyponatremia-this is likely hyperosmolar hyponatremia secondary to the hyperglycemia. -This should correct as her blood sugars correct.  #4 hyperkalemia-this is likely the result of the acute renal failure and acidosis. -As her acidosis improves and her renal function improves her hyperkalemia should also improve  #5 leukocytosis-I suspect this is stress mediated from the DKA. No evidence of acute infectious source. -Patient has received 1 dose of IV vancomycin, aztreonam but I will not order any further antibiotics at this time. -I will repeat her white cell count in the morning.  #6 elevated troponin-likely the setting of demand  ischemia.  #7 hypothyroidism-continue Synthroid.  #8 GERD-continue Protonix.    All the records are reviewed and case discussed with ED provider. Management plans discussed with the patient, family and they are in agreement.  CODE STATUS: Full  TOTAL CRITICAL CARE TIME TAKING CARE OF THIS PATIENT: 50 minutes.    Henreitta Leber M.D on 06/08/2015 at 5:22 PM  Between 7am to 6pm - Pager - [phone]  After 6pm go to [email or web address] - password [email or web address] [email or web address]  Office  [phone]  CC: Primary care physician; Rica Mast, MD

## 2015-06-08 NOTE — ED Notes (Signed)
Pt comes into the ED via EMS from home with c/o N/V and abd pain since wednesday and today she was so weak she slid from a chair into the floor and used her life alert to call for help.the patient has very dry mucous membranes on arrival with some confusion.Marland Kitchen

## 2015-06-08 NOTE — Progress Notes (Signed)
Patient just arrived from ED via stretcher and on insulin drip. She is alert, confused, but denies any pain at this time. Transferred her to our bed, Elink notified, vital signs entered. Report received from ED nurse and Night nurse now here and will pass report on to her.

## 2015-06-08 NOTE — Progress Notes (Signed)
[facility] Progress Note Patient Name: [name] [name] DOB: November 15, 1931 MRN: [id number]   Date of Service  06/08/2015  HPI/Events of Note  New admission for DKA Stable on camera check  eICU Interventions  No eICU intervention needed     Intervention Category Evaluation Type: New Patient Evaluation  Simonne Maffucci 06/08/2015, 7:26 PM

## 2015-06-08 NOTE — ED Provider Notes (Signed)
[facility] Emergency Department Provider Note  ____________________________________________  Time seen: Approximately 12:19 PM  I have reviewed the triage vital signs and the nursing notes.   HISTORY  Chief Complaint Emesis and Hyperglycemia  Caveat - history of present illness review of systems limited secondary to the patient's altered mental status.  HPI [place] is a 81 y.o. female with history of coronary artery disease, diabetes, GERD, hyperlipidemia, hypothyroidism who presents for evaluation of 2-3 days gradual onset constant nonbloody nonbilious emesis as well as confusion, worsening since onset, currently severe. She denies any diarrhea, chest pain or difficulty breathing. On EMS arrival, her blood sugar was greater than 700 and she was tachycardic. No other history is obtained.   Past Medical History  Diagnosis Date  . Diabetes mellitus   . Cancer [facility])     2004 Right breast, found on mammogram, radiation therapy, Dr. Bryson Ha, uterine cancer,   . OSA (obstructive sleep apnea)   . Osteoporosis 02/21/09    DEXA scan showed osteoporosis with left femur T-score -2.8.  Marland Kitchen Thyroid disease     Hypothyroid  . Hyperlipidemia   . CAD (coronary artery disease)   . Anxiety state, unspecified   . Vitamin D deficiency   . Esophageal reflux   . Myocardial infarction [facility])     Cath negative except for 40% occlusion LAD.  Pt not candidate for betablocker or ACEI because of hypotension  . Hypotension   . Presbyacusis   . COPD (chronic obstructive pulmonary disease) ([place])   . Hearing loss   . IBS (irritable bowel syndrome)   . Heart burn     Patient Active Problem List   Diagnosis Date Noted  . Diabetic ketoacidosis ([place]) 06/08/2015  . Long term current use of insulin ([place]) 03/08/2014  . Type 2 diabetes mellitus with renal manifestations not at goal [facility]) 02/22/2014  . CKD stage 3 due to type 2 diabetes mellitus ([place]) 02/22/2014  . Unspecified  constipation 12/02/2012  . Hypothyroidism 09/23/2012  . GERD (gastroesophageal reflux disease) 09/23/2012  . Statin intolerance 07/26/2012  . Anxiety 07/26/2012  . Hyperlipidemia with target LDL less than 70 12/03/2011  . Mesenteric ischemia 07/16/2011  . DM (diabetes mellitus) type II uncontrolled, periph vascular disorder ([place]) 04/03/2011  . CAD, NATIVE VESSEL 12/13/2009    Past Surgical History  Procedure Laterality Date  . Abdominal hysterectomy      uterine cancer  . Breast surgery    . Colon surgery  2013    done at [facility]    No current outpatient prescriptions on file.  Allergies Penicillins  Family History  Problem Relation Age of Onset  . Diabetes Mother   . Heart attack Father     Social History Social History  Substance Use Topics  . Smoking status: Never Smoker   . Smokeless tobacco: Never Used  . Alcohol Use: No    Review of Systems Constitutional: No fever/chills Eyes: No visual changes. ENT: No sore throat. Cardiovascular: Denies chest pain. Respiratory: Denies shortness of breath. Gastrointestinal: No abdominal pain.  + nausea, + vomiting.  No diarrhea.  No constipation. Genitourinary: Negative for dysuria. Musculoskeletal: Negative for back pain. Skin: Negative for rash. Neurological: Negative for headaches, focal weakness or numbness.  10-point ROS otherwise negative.  ____________________________________________   PHYSICAL EXAM:  Filed Vitals:   06/08/15 1849 06/08/15 1900 06/08/15 2000 06/08/15 2100  BP: 138/114 151/131 120/88 81/63  Pulse: 124 117 118 106  Temp: 98.4 F (36.9 C)  TempSrc: Axillary     Resp: 16 18 19 18   Height: 5\' 7"  (1.702 m)     Weight: 149 lb 7.6 oz (67.8 kg)     SpO2: 99% 98% 100% 99%   Constitutional: Alert and oriented to self and appears confused and answers questions appropriately. In no acute distress, smells of ketones. Eyes: Conjunctivae are normal. PERRL. EOMI. Head: Atraumatic. Nose: No  congestion/rhinnorhea. Mouth/Throat: Mucous membranes are dry  Oropharynx non-erythematous. Neck: No stridor.   Cardiovascular: tachycardic rate, regular rhythm. Grossly normal heart sounds.  Good peripheral circulation. Respiratory: Mild tachypnea. Normal respiratory effort.  No retractions. Lungs CTAB. Gastrointestinal: Soft and nontender. No distention.  No CVA tenderness. Genitourinary: deferred Musculoskeletal: No lower extremity tenderness nor edema.  No joint effusions. Neurologic:  Normal speech and language without dysarthria. Follows commands to move all extremities which she appears to do equally however she does not cooperate with formal neurological testing. Skin:  Skin is warm, dry and intact. No rash noted. Psychiatric:Unable to assess  ____________________________________________   LABS (all labs ordered are listed, but only abnormal results are displayed)  Labs Reviewed  CBC WITH DIFFERENTIAL/PLATELET - Abnormal; Notable for the following:    WBC 18.2 (*)    RBC 3.72 (*)    Hemoglobin 11.9 (*)    MCV 108.4 (*)    MCHC 29.5 (*)    RDW 15.4 (*)    Neutro Abs 16.4 (*)    Lymphs Abs 0.9 (*)    All other components within normal limits  COMPREHENSIVE METABOLIC PANEL - Abnormal; Notable for the following:    Sodium 128 (*)    Potassium 6.2 (*)    Chloride 87 (*)    CO2 8 (*)    Glucose, Bld 1186 (*)    BUN 47 (*)    Creatinine, Ser 2.56 (*)    Alkaline Phosphatase 131 (*)    Total Bilirubin 2.0 (*)    GFR calc non Af Amer 16 (*)    GFR calc Af Amer 19 (*)    Anion gap 33 (*)    All other components within normal limits  URINALYSIS COMPLETEWITH MICROSCOPIC ([facility] ONLY) - Abnormal; Notable for the following:    Color, Urine YELLOW (*)    APPearance HAZY (*)    Glucose, UA >500 (*)    Ketones, ur 1+ (*)    Bacteria, UA RARE (*)    Squamous Epithelial / LPF 0-5 (*)    All other components within normal limits  TROPONIN I - Abnormal; Notable for the following:     Troponin I 0.07 (*)    All other components within normal limits  BLOOD GAS, VENOUS - Abnormal; Notable for the following:    pH, Ven 7.04 (*)    pCO2, Ven 26 (*)    Bicarbonate 7.0 (*)    Acid-base deficit 22.3 (*)    All other components within normal limits  LACTIC ACID, PLASMA - Abnormal; Notable for the following:    Lactic Acid, Venous 4.9 (*)    All other components within normal limits  LACTIC ACID, PLASMA - Abnormal; Notable for the following:    Lactic Acid, Venous 4.5 (*)    All other components within normal limits  T4, FREE - Abnormal; Notable for the following:    Free T4 1.25 (*)    All other components within normal limits  BASIC METABOLIC PANEL - Abnormal; Notable for the following:    Sodium 132 (*)    Potassium  5.5 (*)    Chloride 98 (*)    CO2 <5 (*)    Glucose, Bld 1100 (*)    BUN 48 (*)    Creatinine, Ser 2.32 (*)    Calcium 7.8 (*)    GFR calc non Af Amer 19 (*)    GFR calc Af Amer 22 (*)    All other components within normal limits  GLUCOSE, CAPILLARY - Abnormal; Notable for the following:    Glucose-Capillary >600 (*)    All other components within normal limits  BASIC METABOLIC PANEL - Abnormal; Notable for the following:    CO2 10 (*)    Glucose, Bld 596 (*)    BUN 44 (*)    Creatinine, Ser 2.18 (*)    Calcium 8.3 (*)    GFR calc non Af Amer 20 (*)    GFR calc Af Amer 23 (*)    Anion gap 23 (*)    All other components within normal limits  GLUCOSE, CAPILLARY - Abnormal; Notable for the following:    Glucose-Capillary >600 (*)    All other components within normal limits  GLUCOSE, CAPILLARY - Abnormal; Notable for the following:    Glucose-Capillary >600 (*)    All other components within normal limits  GLUCOSE, CAPILLARY - Abnormal; Notable for the following:    Glucose-Capillary 392 (*)    All other components within normal limits  MRSA PCR SCREENING  LIPASE, BLOOD  TSH  BASIC METABOLIC PANEL  BASIC METABOLIC PANEL  BASIC  METABOLIC PANEL  CBC  CBC  HEMOGLOBIN [id number]  BASIC METABOLIC PANEL   ____________________________________________  EKG  ED ECG REPORT I, Joanne Gavel, the attending physician, personally viewed and interpreted this ECG.   Date: 06/08/2015  EKG Time: 12:13  Rate: 119  Rhythm: sinus tachycardia  Axis: normal  Intervals:nonspecific intraventricular conduction delay  ST&T Change: No acute ST elevation.  ____________________________________________  RADIOLOGY  CXR IMPRESSION: No edema or consolidation.    CT abdomen and pelvis IMPRESSION: Somewhat limited exam although no acute abnormality is noted.  ____________________________________________   PROCEDURES  Procedure(s) performed: None  Critical Care performed: Yes, see critical care note(s). Total critical care time spent 45 minutes.  ____________________________________________   INITIAL IMPRESSION / ASSESSMENT AND PLAN / ED COURSE  Pertinent labs & imaging results that were available during my care of the patient were reviewed by me and considered in my medical decision making (see chart for details).  JOIE FAKHOURY is a 81 y.o. female with history of coronary artery disease, diabetes, GERD, hyperlipidemia, hypothyroidism who presents for evaluation of 2-3 days gradual onset constant nonbloody nonbilious emesis as well as confusion. On exam, she is awake but appears confused, dry mucous membranes, tachycardic and tachypneic and smells of ketones which is concerning for DKA. She appears to have an intact neurological examination. Plan for liberal IV fluids, will obtain labs, VBG. She is meeting several Sirs criteria so will give vancomycin and aztreonam empirically. We'll obtain blood cultures, chest x-ray and urinalysis. Anticipate admission.   ----------------------------------------- 4:10 PM on 06/08/2015 ----------------------------------------- Labs reviewed. CMP notable for pseudohyponatremia,  hyperkalemia with potassium 6.2. No acute hyperkalemic EKG change. Acute renal failure with creatinine 2.56. CBC notable for leukocytosis, mild anemia. Urinalysis is not consistent with infection. Insulin drip ordered. Chest x-ray shows no acute intracranial abnormality. Case discussed with Dr. Verdell Carmine, hospitalist, for admission at this time. CT of the abdomen and pelvis shows no acute abnormality. ____________________________________________   FINAL CLINICAL IMPRESSION(S) /  ED DIAGNOSES  Final diagnoses:  Altered mental status, unspecified altered mental status type  Diabetic ketoacidosis without coma associated with diabetes mellitus due to underlying condition ([place])  Hyperkalemia  Acute renal failure, unspecified acute renal failure type ([place])  Elevated troponin level      Joanne Gavel, MD 06/08/15 2146

## 2015-06-09 LAB — BASIC METABOLIC PANEL
ANION GAP: 9 (ref 5–15)
Anion gap: 7 (ref 5–15)
Anion gap: 10 (ref 5–15)
BUN: 40 mg/dL — AB (ref 6–20)
BUN: 40 mg/dL — AB (ref 6–20)
BUN: 38 mg/dL — AB (ref 6–20)
CALCIUM: 8.6 mg/dL — AB (ref 8.9–10.3)
CO2: 20 mmol/L — ABNORMAL LOW (ref 22–32)
CO2: 18 mmol/L — ABNORMAL LOW (ref 22–32)
CO2: 20 mmol/L — ABNORMAL LOW (ref 22–32)
CREATININE: 1.44 mg/dL — AB (ref 0.44–1.00)
Calcium: 8.5 mg/dL — ABNORMAL LOW (ref 8.9–10.3)
Calcium: 8.6 mg/dL — ABNORMAL LOW (ref 8.9–10.3)
Chloride: 116 mmol/L — ABNORMAL HIGH (ref 101–111)
Chloride: 117 mmol/L — ABNORMAL HIGH (ref 101–111)
Chloride: 112 mmol/L — ABNORMAL HIGH (ref 101–111)
Creatinine, Ser: 1.67 mg/dL — ABNORMAL HIGH (ref 0.44–1.00)
Creatinine, Ser: 1.51 mg/dL — ABNORMAL HIGH (ref 0.44–1.00)
GFR calc Af Amer: 32 mL/min — ABNORMAL LOW (ref >60)
GFR calc Af Amer: 36 mL/min — ABNORMAL LOW (ref >60)
GFR calc Af Amer: 38 mL/min — ABNORMAL LOW (ref >60)
GFR, EST NON AFRICAN AMERICAN: 28 mL/min — AB (ref >60)
GFR, EST NON AFRICAN AMERICAN: 31 mL/min — AB (ref >60)
GFR, EST NON AFRICAN AMERICAN: 33 mL/min — AB (ref >60)
GLUCOSE: 171 mg/dL — AB (ref 65–99)
GLUCOSE: 205 mg/dL — AB (ref 65–99)
GLUCOSE: 176 mg/dL — AB (ref 65–99)
POTASSIUM: 3.6 mmol/L (ref 3.5–5.1)
POTASSIUM: 4.6 mmol/L (ref 3.5–5.1)
Potassium: 3.9 mmol/L (ref 3.5–5.1)
Sodium: 143 mmol/L (ref 135–145)
Sodium: 145 mmol/L (ref 135–145)
Sodium: 141 mmol/L (ref 135–145)

## 2015-06-09 LAB — GLUCOSE, CAPILLARY
GLUCOSE-CAPILLARY: 142 mg/dL — AB (ref 65–99)
GLUCOSE-CAPILLARY: 152 mg/dL — AB (ref 65–99)
GLUCOSE-CAPILLARY: 128 mg/dL — AB (ref 65–99)
GLUCOSE-CAPILLARY: 143 mg/dL — AB (ref 65–99)
GLUCOSE-CAPILLARY: 91 mg/dL (ref 65–99)
GLUCOSE-CAPILLARY: 213 mg/dL — AB (ref 65–99)
GLUCOSE-CAPILLARY: 169 mg/dL — AB (ref 65–99)
Glucose-Capillary: 106 mg/dL — ABNORMAL HIGH (ref 65–99)
Glucose-Capillary: 107 mg/dL — ABNORMAL HIGH (ref 65–99)
Glucose-Capillary: 108 mg/dL — ABNORMAL HIGH (ref 65–99)
Glucose-Capillary: 115 mg/dL — ABNORMAL HIGH (ref 65–99)
Glucose-Capillary: 137 mg/dL — ABNORMAL HIGH (ref 65–99)
Glucose-Capillary: 149 mg/dL — ABNORMAL HIGH (ref 65–99)
Glucose-Capillary: 173 mg/dL — ABNORMAL HIGH (ref 65–99)
Glucose-Capillary: 184 mg/dL — ABNORMAL HIGH (ref 65–99)
Glucose-Capillary: 187 mg/dL — ABNORMAL HIGH (ref 65–99)
Glucose-Capillary: 199 mg/dL — ABNORMAL HIGH (ref 65–99)

## 2015-06-09 LAB — CBC
HEMATOCRIT: 32 % — AB (ref 35.0–47.0)
HEMOGLOBIN: 10.5 g/dL — AB (ref 12.0–16.0)
MCH: 31.3 pg (ref 26.0–34.0)
MCHC: 32.8 g/dL (ref 32.0–36.0)
MCV: 95.6 fL (ref 80.0–100.0)
Platelets: 242 10*3/uL (ref 150–440)
RBC: 3.35 MIL/uL — AB (ref 3.80–5.20)
RDW: 14.2 % (ref 11.5–14.5)
WBC: 20 10*3/uL — ABNORMAL HIGH (ref 3.6–11.0)

## 2015-06-09 LAB — HEMOGLOBIN A1C: HEMOGLOBIN A1C: 11.4 % — AB (ref 4.0–6.0)

## 2015-06-09 MED ORDER — DEXTROSE 10 % IV SOLN: INTRAVENOUS | Status: DC | PRN

## 2015-06-09 MED ORDER — INSULIN ASPART 100 UNIT/ML ~~LOC~~ SOLN
1.0000 [IU] | SUBCUTANEOUS | Status: DC
  Administered 2015-06-09: 3 [IU] via SUBCUTANEOUS
  Administered 2015-06-09: 2 [IU] via SUBCUTANEOUS
  Administered 2015-06-09: 1 [IU] via SUBCUTANEOUS
  Administered 2015-06-10: 3 [IU] via SUBCUTANEOUS
  Administered 2015-06-10 (×2): 1 [IU] via SUBCUTANEOUS
  Administered 2015-06-11 (×2): 3 [IU] via SUBCUTANEOUS
  Administered 2015-06-11 (×2): 2 [IU] via SUBCUTANEOUS
  Administered 2015-06-11: 12:00:00 1 [IU] via SUBCUTANEOUS
  Filled 2015-06-09: qty 2
  Filled 2015-06-09: qty 3
  Filled 2015-06-09: qty 1
  Filled 2015-06-09: qty 2
  Filled 2015-06-09: qty 1
  Filled 2015-06-09 (×2): qty 3
  Filled 2015-06-09: qty 8
  Filled 2015-06-09: qty 3
  Filled 2015-06-09 (×2): qty 1
  Filled 2015-06-09: qty 3
  Filled 2015-06-09 (×2): qty 2

## 2015-06-09 MED ORDER — INSULIN ASPART 100 UNIT/ML ~~LOC~~ SOLN
5.0000 [IU] | Freq: Three times a day (TID) | SUBCUTANEOUS | Status: DC
  Administered 2015-06-09 – 2015-06-10 (×3): 5 [IU] via SUBCUTANEOUS
  Filled 2015-06-09 (×4): qty 5

## 2015-06-09 MED ORDER — INSULIN GLARGINE 100 UNIT/ML ~~LOC~~ SOLN
15.0000 [IU] | SUBCUTANEOUS | Status: DC
  Administered 2015-06-09 – 2015-06-10 (×2): 15 [IU] via SUBCUTANEOUS
  Filled 2015-06-09 (×2): qty 0.15

## 2015-06-09 NOTE — Progress Notes (Signed)
Spoke with Dr. Verdell Carmine regarding insulin drip. New order to start 5 units of novolog 3 times a day in stead of home medication and to continue SSI as ordered. Insulin drip stopped, ordered not to give Lantus due to patient receiving a dose this morning around 0630. Physician made aware of low BP. Physician said he is ok with a MAP greater than 50 as long as she is not symptomatic. Pt is now more alert but is still confused. Foley in place. One large BM. Minimal urine output. Ate a late lunch. Currently in room resting on room air.

## 2015-06-09 NOTE — Progress Notes (Signed)
[place] [facility] [place] NAME: [name] [name]    MR#:  [id number]  DATE OF BIRTH:  Aug 16, 1931  SUBJECTIVE:  CHIEF COMPLAINT:   Chief Complaint  Patient presents with  . Emesis  . Hyperglycemia   She is here due to altered mental status and noted to be in diabetic ketoacidosis. DKA is now resolved but mental status is still tenuous. No acute complaints  REVIEW OF SYSTEMS:    Review of Systems  Unable to perform ROS: mental acuity    Nutrition: Carb control Tolerating Diet: Yes Tolerating PT: Await evaluation  DRUG ALLERGIES:   Allergies  Allergen Reactions  . Penicillins Other (See Comments)    Reaction:  Unknown    VITALS:  Blood pressure 105/50, pulse 85, temperature 98.5 F (36.9 C), temperature source Oral, resp. rate 16, height 5\' 7"  (1.702 m), weight 67.8 kg (149 lb 7.6 oz), SpO2 100 %.  PHYSICAL EXAMINATION:   Physical Exam  GENERAL: 81 y.o.-year-old patient lying in the bed confused but in no apparent distress.  EYES: Pupils equal, round, reactive to light and accommodation. No scleral icterus. Extraocular muscles intact.  HEENT: Head atraumatic, normocephalic. Oropharynx and nasopharynx clear. No oropharyngeal erythema, dry oral mucosa  NECK: Supple, no jugular venous distention. No thyroid enlargement, no tenderness.  LUNGS: Normal breath sounds bilaterally, no wheezing, rales, rhonchi. No use of accessory muscles of respiration.  CARDIOVASCULAR: S1, S2 RRR. No murmurs, rubs, gallops, clicks.  ABDOMEN: Soft, nontender, nondistended. Bowel sounds present. No organomegaly or mass.  EXTREMITIES: No pedal edema, cyanosis, or clubbing. + 2 pedal & radial pulses b/l.  NEUROLOGIC: Cranial nerves II through XII are intact. No focal Motor or sensory deficits appreciated b/l.  Globally weak.  PSYCHIATRIC: The patient is alert and oriented x 1.  SKIN: No obvious rash, lesion, or ulcer.    LABORATORY PANEL:    CBC  Recent Labs Lab 06/09/15 0647  WBC 20.0*  HGB 10.5*  HCT 32.0*  PLT 242   ------------------------------------------------------------------------------------------------------------------  Chemistries   Recent Labs Lab 06/08/15 1231  06/09/15 0647  NA 128*  < > 141  K 6.2*  < > 3.9  CL 87*  < > 112*  CO2 8*  < > 20*  GLUCOSE 1186*  < > 176*  BUN 47*  < > 38*  CREATININE 2.56*  < > 1.44*  CALCIUM 9.2  < > 8.6*  AST 24  --   --   ALT 23  --   --   ALKPHOS 131*  --   --   BILITOT 2.0*  --   --   < > = values in this interval not displayed. ------------------------------------------------------------------------------------------------------------------  Cardiac Enzymes  Recent Labs Lab 06/08/15 1231  TROPONINI 0.07*   ------------------------------------------------------------------------------------------------------------------  RADIOLOGY:  Ct Abdomen Pelvis Wo Contrast  06/08/2015  CLINICAL DATA:  Nausea, vomiting and abdominal pain for 2 days EXAM: CT ABDOMEN AND PELVIS WITHOUT CONTRAST TECHNIQUE: Multidetector CT imaging of the abdomen and pelvis was performed following the standard protocol without IV contrast. COMPARISON:  03/21/2013 FINDINGS: The lung bases demonstrate some scarring bilaterally. The images are significantly degraded by motion artifact. The liver, spleen, adrenal glands and pancreas are within normal limits. The gallbladder is within normal limits although again limited by motion artifact. The kidneys demonstrate no evidence of renal calculi or obstructive changes. Fecal material is seen within the colon. No definitive obstructive changes are seen. The bladder is within normal limits. Aortoiliac  calcifications are seen without aneurysmal dilatation. The osseous structures show no acute abnormality. IMPRESSION: Somewhat limited exam although no acute abnormality is noted. Electronically Signed   By: Inez Catalina M.D.   On: 06/08/2015 15:38    Dg Chest Portable 1 View  06/08/2015  CLINICAL DATA:  Hyperglycemia with nausea and vomiting EXAM: PORTABLE CHEST 1 VIEW COMPARISON:  Dec 26, 2014 FINDINGS: There is no edema or consolidation. Heart size and pulmonary vascularity are normal. No adenopathy. No bone lesions. IMPRESSION: No edema or consolidation. Electronically Signed   By: Lowella Grip III M.D.   On: 06/08/2015 13:05     ASSESSMENT AND PLAN:   81 year old female with past medical history of diabetes, hypertension, GERD, history of previous MI, hypothyroidism, osteoporosis, irritable bowel syndrome, who presented to the hospital due to altered mental status and noted to be in acute diabetic ketoacidosis.  #1 acute diabetic ketoacidosis-this is likely secondary to patient not taking her insulin for the past 2 days. -I do not see any evidence of acute infectious source to exacerbate her hyperglycemia. Her urinalysis is negative, her chest x-ray is negative. Patient was empirically given IV vancomycin, Aztreonam by the ER. -Anion gap is closed. Off insulin drip. -I will start her on Lantus, NovoLog with meals and sliding scale coverage. We'll do diabetic lifestyle consult. - hemoglobin A1c is 11.  #2 acute renal failure-this is likely in setting of DKA and dehydration. -Improving with IV fluids and will continue to monitor.  #3 hyponatremia-this is likely hyperosmolar hyponatremia secondary to the hyperglycemia. -Resolved as the patient's blood sugars have corrected.  #4 hyperkalemia-this is likely the result of the acute renal failure and acidosis. -Resolved as her acidosis and renal failure is improved  #5 leukocytosis-I suspect this is stress mediated from the DKA. No evidence of acute infectious source. -Patient has received 1 dose of IV vancomycin, aztreonam  -I will repeat her white cell count in the morning.  #6 elevated troponin-likely the setting of demand ischemia.  #7 hypothyroidism-continue  Synthroid.  #8 GERD-continue Protonix.  #9 altered mental status-this is likely metabolic encephalopathy from the DKA and is slow to improve. We'll continue to monitor. No evidence of acute infectious source presently. -If not improving would consider neurology consult   All the records are reviewed and case discussed with Care Management/Social Workerr. Management plans discussed with the patient, family and they are in agreement.  CODE STATUS: Full  DVT Prophylaxis: Heparin subcutaneous  TOTAL TIME TAKING CARE OF THIS PATIENT: 30 minutes.   POSSIBLE D/C IN 2-3 DAYS, DEPENDING ON CLINICAL CONDITION.   Henreitta Leber M.D on 06/09/2015 at 3:21 PM  Between 7am to 6pm - Pager - [phone]  After 6pm go to [email or web address] - password [email or web address] [facility]  Office  [phone]  CC: Primary care physician; Rica Mast, MD

## 2015-06-10 LAB — BASIC METABOLIC PANEL
Anion gap: 5 (ref 5–15)
BUN: 24 mg/dL — ABNORMAL HIGH (ref 6–20)
CALCIUM: 8.5 mg/dL — AB (ref 8.9–10.3)
CO2: 20 mmol/L — AB (ref 22–32)
CREATININE: 0.86 mg/dL (ref 0.44–1.00)
Chloride: 114 mmol/L — ABNORMAL HIGH (ref 101–111)
GLUCOSE: 149 mg/dL — AB (ref 65–99)
Potassium: 3.9 mmol/L (ref 3.5–5.1)
Sodium: 139 mmol/L (ref 135–145)

## 2015-06-10 LAB — CBC
HEMATOCRIT: 27.7 % — AB (ref 35.0–47.0)
Hemoglobin: 9 g/dL — ABNORMAL LOW (ref 12.0–16.0)
MCH: 31.7 pg (ref 26.0–34.0)
MCHC: 32.6 g/dL (ref 32.0–36.0)
MCV: 97.1 fL (ref 80.0–100.0)
PLATELETS: 176 10*3/uL (ref 150–440)
RBC: 2.85 MIL/uL — ABNORMAL LOW (ref 3.80–5.20)
RDW: 14.8 % — AB (ref 11.5–14.5)
WBC: 10.2 10*3/uL (ref 3.6–11.0)

## 2015-06-10 LAB — GLUCOSE, CAPILLARY
GLUCOSE-CAPILLARY: 105 mg/dL — AB (ref 65–99)
GLUCOSE-CAPILLARY: 125 mg/dL — AB (ref 65–99)
GLUCOSE-CAPILLARY: 151 mg/dL — AB (ref 65–99)
GLUCOSE-CAPILLARY: 227 mg/dL — AB (ref 65–99)
GLUCOSE-CAPILLARY: 66 mg/dL (ref 65–99)
Glucose-Capillary: 141 mg/dL — ABNORMAL HIGH (ref 65–99)
Glucose-Capillary: 217 mg/dL — ABNORMAL HIGH (ref 65–99)
Glucose-Capillary: 224 mg/dL — ABNORMAL HIGH (ref 65–99)
Glucose-Capillary: 412 mg/dL — ABNORMAL HIGH (ref 65–99)

## 2015-06-10 MED ORDER — PNEUMOCOCCAL VAC POLYVALENT 25 MCG/0.5ML IJ INJ
0.5000 mL | INJECTION | INTRAMUSCULAR | Status: DC
  Filled 2015-06-10: qty 0.5

## 2015-06-10 MED ORDER — INSULIN ASPART 100 UNIT/ML ~~LOC~~ SOLN
8.0000 [IU] | Freq: Once | SUBCUTANEOUS | Status: AC
  Administered 2015-06-10: 8 [IU] via SUBCUTANEOUS

## 2015-06-10 MED ORDER — INSULIN GLARGINE 100 UNIT/ML ~~LOC~~ SOLN
20.0000 [IU] | SUBCUTANEOUS | Status: DC
  Administered 2015-06-11: 20 [IU] via SUBCUTANEOUS
  Filled 2015-06-10 (×2): qty 0.2

## 2015-06-10 MED ORDER — INSULIN ASPART 100 UNIT/ML ~~LOC~~ SOLN
8.0000 [IU] | Freq: Three times a day (TID) | SUBCUTANEOUS | Status: DC
  Administered 2015-06-10 – 2015-06-11 (×3): 8 [IU] via SUBCUTANEOUS
  Filled 2015-06-10 (×2): qty 8

## 2015-06-10 NOTE — Progress Notes (Signed)
Patient CPG 401, previously administered 5 units scheduled Novolog. Called Dr. Verdell Carmine with [facility], MD order for additional 8 units Novolog, administered.

## 2015-06-10 NOTE — Progress Notes (Addendum)
[place] at [place] NAME: [name] [name]    MR#:  [id number]  DATE OF BIRTH:  1931-12-12  SUBJECTIVE:   DKA now resolved.  Mental status much improved and back to baseline.  Family at bedside.  No complaints presently.  BS stable.   REVIEW OF SYSTEMS:    Review of Systems  Constitutional: Negative for fever and chills.  HENT: Negative for congestion and tinnitus.   Eyes: Negative for blurred vision and double vision.  Respiratory: Negative for cough, shortness of breath and wheezing.   Cardiovascular: Negative for chest pain, orthopnea and PND.  Gastrointestinal: Negative for nausea, vomiting, abdominal pain and diarrhea.  Genitourinary: Negative for dysuria and hematuria.  Neurological: Negative for dizziness, sensory change and focal weakness.  All other systems reviewed and are negative.   Nutrition: Carb control Tolerating Diet: Yes Tolerating PT: Await evaluation  DRUG ALLERGIES:   Allergies  Allergen Reactions  . Penicillins Other (See Comments)    Reaction:  Unknown    VITALS:  Blood pressure 102/58, pulse 70, temperature 98.3 F (36.8 C), temperature source Axillary, resp. rate 25, height 5\' 7"  (1.702 m), weight 67.8 kg (149 lb 7.6 oz), SpO2 96 %.  PHYSICAL EXAMINATION:   Physical Exam  GENERAL: 81 y.o.-year-old patient lying in the bed confused but in no apparent distress.  EYES: Pupils equal, round, reactive to light and accommodation. No scleral icterus. Extraocular muscles intact.  HEENT: Head atraumatic, normocephalic. Oropharynx and nasopharynx clear. No oropharyngeal erythema, dry oral mucosa  NECK: Supple, no jugular venous distention. No thyroid enlargement, no tenderness.  LUNGS: Normal breath sounds bilaterally, no wheezing, rales, rhonchi. No use of accessory muscles of respiration.  CARDIOVASCULAR: S1, S2 RRR. No murmurs, rubs, gallops, clicks.  ABDOMEN: Soft, nontender, nondistended.  Bowel sounds present. No organomegaly or mass.  EXTREMITIES: No pedal edema, cyanosis, or clubbing. + 2 pedal & radial pulses b/l.  NEUROLOGIC: Cranial nerves II through XII are intact. No focal Motor or sensory deficits appreciated b/l.  Globally weak.  PSYCHIATRIC: The patient is alert and oriented x 2.  SKIN: No obvious rash, lesion, or ulcer.    LABORATORY PANEL:   CBC  Recent Labs Lab 06/10/15 0553  WBC 10.2  HGB 9.0*  HCT 27.7*  PLT 176   ------------------------------------------------------------------------------------------------------------------  Chemistries   Recent Labs Lab 06/08/15 1231  06/10/15 0553  NA 128*  < > 139  K 6.2*  < > 3.9  CL 87*  < > 114*  CO2 8*  < > 20*  GLUCOSE 1186*  < > 149*  BUN 47*  < > 24*  CREATININE 2.56*  < > 0.86  CALCIUM 9.2  < > 8.5*  AST 24  --   --   ALT 23  --   --   ALKPHOS 131*  --   --   BILITOT 2.0*  --   --   < > = values in this interval not displayed. ------------------------------------------------------------------------------------------------------------------  Cardiac Enzymes  Recent Labs Lab 06/08/15 1231  TROPONINI 0.07*   ------------------------------------------------------------------------------------------------------------------  RADIOLOGY:  Ct Abdomen Pelvis Wo Contrast  06/08/2015  CLINICAL DATA:  Nausea, vomiting and abdominal pain for 2 days EXAM: CT ABDOMEN AND PELVIS WITHOUT CONTRAST TECHNIQUE: Multidetector CT imaging of the abdomen and pelvis was performed following the standard protocol without IV contrast. COMPARISON:  03/21/2013 FINDINGS: The lung bases demonstrate some scarring bilaterally. The images are significantly degraded by motion artifact. The liver, spleen, adrenal  glands and pancreas are within normal limits. The gallbladder is within normal limits although again limited by motion artifact. The kidneys demonstrate no evidence of renal calculi or obstructive changes.  Fecal material is seen within the colon. No definitive obstructive changes are seen. The bladder is within normal limits. Aortoiliac calcifications are seen without aneurysmal dilatation. The osseous structures show no acute abnormality. IMPRESSION: Somewhat limited exam although no acute abnormality is noted. Electronically Signed   By: Inez Catalina M.D.   On: 06/08/2015 15:38     ASSESSMENT AND PLAN:   81 year old female with past medical history of diabetes, hypertension, GERD, history of previous MI, hypothyroidism, osteoporosis, irritable bowel syndrome, who presented to the hospital due to altered mental status and noted to be in acute diabetic ketoacidosis.  #1 acute diabetic ketoacidosis-this is likely secondary to patient not taking her insulin prior to admission. -I do not see any evidence of acute infectious source to exacerbate her hyperglycemia. Her urinalysis is negative, her chest x-ray is negative. Patient was empirically given IV vancomycin, Aztreonam by the ER. -Anion gap is closed. Off insulin drip. -Continue Lantus, NovoLog with meals, sliding scale insulin coverage and follow blood sugars -I will diabetic lifestyle assessment and the morning. - hemoglobin A1c is 11.  #2 acute renal failure-this is likely in setting of DKA and dehydration. -Improved and resolved with IV fluids.  #3 hyponatremia-this is likely hyperosmolar hyponatremia secondary to the hyperglycemia. -Resolved as the patient's blood sugars have corrected.  #4 hyperkalemia-this is likely the result of the acute renal failure and acidosis. -Resolved as her acidosis and renal failure is improved  #5 leukocytosis-I suspect this is stress mediated from the DKA. No evidence of acute infectious source. -Patient has received 1 dose of IV vancomycin, aztreonam  -White cell count normalized now  #6 elevated troponin-likely the setting of demand ischemia.  #7 hypothyroidism-continue Synthroid.  #8  GERD-continue Protonix.  #9 altered mental status-this is likely metabolic encephalopathy from the DKA and is slow to improve. -Mental status is much improved and back to baseline now.  We'll DC Foley, transferred to the floor, get a physical therapy consult. DC tomorrow morning if doing well.   All the records are reviewed and case discussed with Care Management/Social Workerr. Management plans discussed with the patient, family and they are in agreement.  CODE STATUS: Full  DVT Prophylaxis: Heparin subcutaneous  TOTAL TIME TAKING CARE OF THIS PATIENT: 30 minutes.   POSSIBLE D/C IN 1-2 DAYS, DEPENDING ON CLINICAL CONDITION.   Henreitta Leber M.D on 06/10/2015 at 1:39 PM  Between 7am to 6pm - Pager - [phone]  After 6pm go to [email or web address] - password [email or web address] [facility]  Office  [phone]  CC: Primary care physician; Rica Mast, MD

## 2015-06-10 NOTE — Progress Notes (Signed)
Patient transfer to medsurg with no telemetry per MD order. Patient oriented to self, situation. SR on cardiac monitor, RA, SATs WNL. Foley catheter removed at 1400, 450 ml UOP. PIV x 1, SL. Hyperglycemic at 401 at 1200, notified MDwho placed orders for increased scheduled insulin and increased daily Lantus. Repeat CPG 224. Report called to [facility], RN and transported in bed to rm 107.

## 2015-06-10 NOTE — Progress Notes (Signed)
Physical Therapy Evaluation Patient Details Name: [name] [name] MRN: [id number] DOB: 10/05/1931 Today's Date: 06/10/2015   History of Present Illness  Patient is an 81 y.o. female admitted on 11 Nov. for diabetic ketoacidosis and acute renal failure. Patient had progressively worsening altered mental status and nausea/vomiting over course of a few days. PMH includes DM, breast CA, osteoporosis, hypothyroidism, GERD, and anxiety.  Clinical Impression  Patient is a previously independent 81 y.o. Female who lives at home with son. Patient admittedly has RW at home but does not utilize it, reporting furniture walking through home. Denies falls. Patient notes that she experiences lightheadedness frequently when walking at home. Patient experienced similar bout when ambulating 5 feet at bedside today. Because patient is expected to perform community ambulation, it is believed that she will continue to benefit from progressive PT to improve balance, mobility, and function. At this time, it is believed that she would benefit from SNF at d/c but may change, depending on mobility at future session.    Follow Up Recommendations SNF    Equipment Recommendations  None recommended by PT;Other (comment) (Patient has RW at home)    Recommendations for Other Services       Precautions / Restrictions Precautions Precautions: Fall Restrictions Weight Bearing Restrictions: No      Mobility  Bed Mobility Overal bed mobility: Needs Assistance Bed Mobility: Supine to Sit;Sit to Supine     Supine to sit: Min assist Sit to supine: Mod assist   General bed mobility comments: Patient requires minimal assistance scooting to EOB. Required moderate assistance bringing LEs back into bed. Able to bridge and scoot with verbal cues.  Transfers Overall transfer level: Needs assistance Equipment used: Rolling walker (2 wheeled) Transfers: Sit to/from Stand Sit to Stand: Min guard         General  transfer comment: Patient slightly unsteady, having tendency to forward lean. RW utilized to improve balance.  Ambulation/Gait Ambulation/Gait assistance: Min guard Ambulation Distance (Feet): 5 Feet Assistive device: Rolling walker (2 wheeled) Gait Pattern/deviations: Shuffle     General Gait Details: Patient ambulates at decreased cadence. After 5 feet, patient complained of feeling lightheaded. No LOB but slightly unsteady.  Stairs            Wheelchair Mobility    Modified Rankin (Stroke Patients Only)       Balance Overall balance assessment: Needs assistance Sitting-balance support: Feet supported Sitting balance-Leahy Scale: Good Sitting balance - Comments: Patient able to sit on EOB with no UE support.   Standing balance support: Bilateral upper extremity supported Standing balance-Leahy Scale: Fair Standing balance comment: Patient unsteady without AD. RW improved postural control, but patient still slightly unsteady.                             Pertinent Vitals/Pain Pain Assessment: No/denies pain    Home Living Family/patient expects to be discharged to:: Private residence Living Arrangements: Children;Other (Comment) (24 y.o. son) Available Help at Discharge: Family;Available PRN/intermittently Type of Home: House Home Access: Stairs to enter Entrance Stairs-Rails: None Entrance Stairs-Number of Steps: 2 Home Layout: One level Home Equipment: Walker - 2 wheels;Shower seat      Prior Function Level of Independence: Independent         Comments: Patient admits to having RW and not using it. Admittedly furniture walker within home.     Hand Dominance        Extremity/Trunk Assessment  Upper Extremity Assessment: Overall WFL for tasks assessed           Lower Extremity Assessment: Overall WFL for tasks assessed         Communication   Communication: No difficulties  Cognition Arousal/Alertness: Awake/alert Behavior  During Therapy: WFL for tasks assessed/performed Overall Cognitive Status: Within Functional Limits for tasks assessed                      General Comments      Exercises        Assessment/Plan    PT Assessment Patient needs continued PT services  PT Diagnosis Difficulty walking;Generalized weakness   PT Problem List Decreased activity tolerance;Decreased balance;Decreased mobility;Decreased knowledge of use of DME;Decreased safety awareness  PT Treatment Interventions DME instruction;Gait training;Stair training;Functional mobility training;Therapeutic activities;Therapeutic exercise;Balance training;Patient/family education   PT Goals (Current goals can be found in the Care Plan section) Acute Rehab PT Goals Patient Stated Goal: "To get all of these cords detached." PT Goal Formulation: With patient Time For Goal Achievement: 06/24/15 Potential to Achieve Goals: Good    Frequency Min 2X/week   Barriers to discharge Decreased caregiver support      Co-evaluation               End of Session Equipment Utilized During Treatment: Gait belt Activity Tolerance: Patient tolerated treatment well;Other (comment) (Limited by lightheadedness) Patient left: in bed;with call bell/phone within reach;with bed alarm set Nurse Communication: Mobility status         Time: 1310-1330 PT Time Calculation (min) (ACUTE ONLY): 20 min   Charges:   PT Evaluation $Initial PT Evaluation Tier I: 1 Procedure     PT G Codes:        Dorice Lamas, PT, DPT 06/10/2015, 1:44 PM

## 2015-06-10 NOTE — Progress Notes (Signed)
Pt alert and oriented x4, pt is hard of hearing, she does not complain of any pain or discomfort. Will continue to monitor.

## 2015-06-11 ENCOUNTER — Encounter
Admission: RE | Admit: 2015-06-11 | Discharge: 2015-06-11 | Disposition: A | Discharge status: Home / Self Care | Payer: Medicare Other | Source: Ambulatory Visit | Attending: Internal Medicine | Admitting: Internal Medicine

## 2015-06-11 DIAGNOSIS — N179 Acute kidney failure, unspecified: Secondary | ICD-10-CM | POA: Diagnosis not present

## 2015-06-11 DIAGNOSIS — K219 Gastro-esophageal reflux disease without esophagitis: Secondary | ICD-10-CM | POA: Diagnosis not present

## 2015-06-11 DIAGNOSIS — R32 Unspecified urinary incontinence: Secondary | ICD-10-CM | POA: Diagnosis not present

## 2015-06-11 DIAGNOSIS — E119 Type 2 diabetes mellitus without complications: Secondary | ICD-10-CM | POA: Diagnosis not present

## 2015-06-11 DIAGNOSIS — R109 Unspecified abdominal pain: Secondary | ICD-10-CM | POA: Diagnosis not present

## 2015-06-11 DIAGNOSIS — E1165 Type 2 diabetes mellitus with hyperglycemia: Secondary | ICD-10-CM | POA: Diagnosis not present

## 2015-06-11 DIAGNOSIS — Z794 Long term (current) use of insulin: Secondary | ICD-10-CM | POA: Diagnosis not present

## 2015-06-11 DIAGNOSIS — E1310 Other specified diabetes mellitus with ketoacidosis without coma: Secondary | ICD-10-CM | POA: Diagnosis not present

## 2015-06-11 DIAGNOSIS — M810 Age-related osteoporosis without current pathological fracture: Secondary | ICD-10-CM | POA: Diagnosis not present

## 2015-06-11 DIAGNOSIS — M6281 Muscle weakness (generalized): Secondary | ICD-10-CM | POA: Diagnosis not present

## 2015-06-11 DIAGNOSIS — I959 Hypotension, unspecified: Secondary | ICD-10-CM | POA: Diagnosis not present

## 2015-06-11 DIAGNOSIS — E039 Hypothyroidism, unspecified: Secondary | ICD-10-CM | POA: Diagnosis not present

## 2015-06-11 DIAGNOSIS — E875 Hyperkalemia: Secondary | ICD-10-CM | POA: Diagnosis not present

## 2015-06-11 DIAGNOSIS — D72829 Elevated white blood cell count, unspecified: Secondary | ICD-10-CM | POA: Diagnosis not present

## 2015-06-11 DIAGNOSIS — M79672 Pain in left foot: Secondary | ICD-10-CM | POA: Diagnosis not present

## 2015-06-11 DIAGNOSIS — M79671 Pain in right foot: Secondary | ICD-10-CM | POA: Diagnosis not present

## 2015-06-11 DIAGNOSIS — N183 Chronic kidney disease, stage 3 (moderate): Secondary | ICD-10-CM | POA: Diagnosis not present

## 2015-06-11 DIAGNOSIS — E1122 Type 2 diabetes mellitus with diabetic chronic kidney disease: Secondary | ICD-10-CM | POA: Diagnosis not present

## 2015-06-11 DIAGNOSIS — I2510 Atherosclerotic heart disease of native coronary artery without angina pectoris: Secondary | ICD-10-CM | POA: Diagnosis not present

## 2015-06-11 LAB — GLUCOSE, CAPILLARY
GLUCOSE-CAPILLARY: 224 mg/dL — AB (ref 65–99)
Glucose-Capillary: 165 mg/dL — ABNORMAL HIGH (ref 65–99)
Glucose-Capillary: 128 mg/dL — ABNORMAL HIGH (ref 65–99)
Glucose-Capillary: 163 mg/dL — ABNORMAL HIGH (ref 65–99)

## 2015-06-11 NOTE — Discharge Instructions (Signed)
Diabetic Ketoacidosis °Diabetic ketoacidosis is a life-threatening complication of diabetes. If it is not treated, it can cause severe dehydration and organ damage and can lead to a coma or death. °CAUSES °This condition develops when there is not enough of the hormone insulin in the body. Insulin helps the body to break down sugar for energy. Without insulin, the body cannot break down sugar, so it breaks down fats instead. This leads to the production of acids that are called ketones. Ketones are poisonous at high levels. °This condition can be triggered by: °· Stress on the body that is brought on by an illness. °· Medicines that raise blood glucose levels. °· Not taking diabetes medicine. °SYMPTOMS °Symptoms of this condition include: °· Fatigue. °· Weight loss. °· Excessive thirst. °· Light-headedness. °· Fruity or sweet-smelling breath. °· Excessive urination. °· Vision changes. °· Confusion or irritability. °· Nausea. °· Vomiting. °· Rapid breathing. °· Abdominal pain. °· Feeling flushed. °DIAGNOSIS °This condition is diagnosed based on a medical history, a physical exam, and blood tests. You may also have a urine test that checks for ketones. °TREATMENT °This condition may be treated with: °· Fluid replacement. This may be done to correct dehydration. °· Insulin injections. These may be given through the skin or through an IV tube. °· Electrolyte replacement. Electrolytes, such as potassium and sodium, may be given in pill form or through an IV tube. °· Antibiotic medicines. These may be prescribed if your condition was caused by an infection. °HOME CARE INSTRUCTIONS °Eating and Drinking °· Drink enough fluids to keep your urine clear or pale yellow. °· If you cannot eat, alternate between drinking fluids with sugar (such as juice) and salty fluids (such as broth or bouillon). °· If you can eat, follow your usual diet and drink sugar-free liquids, such as water. °Other Instructions °· Take insulin as  directed by your health care provider. Do not skip insulin injections. Do not use expired insulin. °· If your blood sugar is over 240 mg/dL, monitor your urine ketones every 4-6 hours. °· If you were prescribed an antibiotic medicine, finish all of it even if you start to feel better. °· Rest and exercise only as directed by your health care provider. °· If you get sick, call your health care provider and begin treatment quickly. Your body often needs extra insulin to fight an illness. °· Check your blood glucose levels regularly. If your blood glucose is high, drink plenty of fluids. This helps to flush out ketones. °SEEK MEDICAL CARE IF: °· Your blood glucose level is too high or too low. °· You have ketones in your urine. °· You have a fever. °· You cannot eat. °· You cannot tolerate fluids. °· You have been vomiting for more than 2 hours. °· You continue to have symptoms of this condition. °· You develop new symptoms. °SEEK IMMEDIATE MEDICAL CARE IF: °· Your blood glucose levels continue to be high (elevated). °· Your monitor reads "high" even when you are taking insulin. °· You faint. °· You have chest pain. °· You have trouble breathing. °· You have a sudden, severe headache. °· You have sudden weakness in one arm or one leg. °· You have sudden trouble speaking or swallowing. °· You have vomiting or diarrhea that gets worse after 3 hours. °· You feel severely fatigued. °· You have trouble thinking. °· You have abdominal pain. °· You are severely dehydrated. Symptoms of severe dehydration include: °¨ Extreme thirst. °¨ Dry mouth. °¨ Blue lips. °¨   Cold hands and feet. °¨ Rapid breathing. °  °This information is not intended to replace advice given to you by your health care provider. Make sure you discuss any questions you have with your health care provider. °  °Document Released: 07/11/2000 Document Revised: 11/28/2014 Document Reviewed: 06/21/2014 °Elsevier Interactive Patient Education ©2016 [employer] ° °

## 2015-06-11 NOTE — Progress Notes (Signed)
Pt and daugther no longer wanted to wait for EMS had been waiting for 2 hours Daughter Anders Grant states she will transport herself. EMS transport canceled. Pt. D/c with family

## 2015-06-11 NOTE — Discharge Summary (Signed)
[name] NAME: [name]    MR#:  [id number]  DATE OF BIRTH:  [birth year]  DATE OF ADMISSION:  06/08/2015 ADMITTING PHYSICIAN: Henreitta Leber, MD  DATE OF DISCHARGE: 06/11/2015  PRIMARY CARE PHYSICIAN: Rica Mast, MD    ADMISSION DIAGNOSIS:  Hyperkalemia [E87.5] Elevated troponin level [R79.89] Acute renal failure, unspecified acute renal failure type ([place]) [N17.9] Altered mental status, unspecified altered mental status type [R41.82] Diabetic ketoacidosis without coma associated with diabetes mellitus due to underlying condition ([place]) [E08.10] DISCHARGE DIAGNOSIS:  Active Problems:   Diabetic ketoacidosis (HCC)  acute renal failure Hyponatremia Leukocytosis Hyperkalemia Elevated troponin, likely due to supply demand ischemia Acute metabolic encephalopathy due to DKA, now resolved  SECONDARY DIAGNOSIS:   Past Medical History  Diagnosis Date  . Diabetes mellitus   . Cancer [facility])     2004 Right breast, found on mammogram, radiation therapy, Dr. Bryson Ha, uterine cancer,   . OSA (obstructive sleep apnea)   . Osteoporosis 02/21/09    DEXA scan showed osteoporosis with left femur T-score -2.8.  Marland Kitchen Thyroid disease     Hypothyroid  . Hyperlipidemia   . CAD (coronary artery disease)   . Anxiety state, unspecified   . Vitamin D deficiency   . Esophageal reflux   . Myocardial infarction [facility])     Cath negative except for 40% occlusion LAD.  Pt not candidate for betablocker or ACEI because of hypotension  . Hypotension   . Presbyacusis   . COPD (chronic obstructive pulmonary disease) ([place])   . Hearing loss   . IBS (irritable bowel syndrome)   . Heart burn     HOSPITAL COURSE:  81 year old female with past medical history of diabetes, hypertension, GERD, history of previous MI, hypothyroidism, osteoporosis, irritable bowel syndrome, admitted to the hospital due to altered mental status and noted  to be in acute diabetic ketoacidosis.  Please see Dr Edward Jolly dictated history and physical for further details.  #1 acute diabetic ketoacidosis-this is likely secondary to patient not taking her insulin prior to admission. -I do not see any evidence of acute infectious source to exacerbate her hyperglycemia. Her urinalysis is negative, her chest x-ray is negative. hemoglobin A1c is 11.  She will be discharged home back on her home regimen of insulin.  #2 acute renal failure-this is likely in setting of DKA and dehydration. -Improved and resolved with IV fluids.  #3 hyponatremia-this is likely hyperosmolar hyponatremia secondary to the hyperglycemia. -Resolved as the patient's blood sugars have corrected.  #4 hyperkalemia-this is likely the result of the acute renal failure and acidosis. -Resolved as her acidosis and renal failure is improved  #5 leukocytosis-I suspect this is stress mediated from the DKA. No evidence of acute infectious source. -Patient has received 1 dose of IV vancomycin, aztreonam  -White cell count normalized now  #6 elevated troponin-likely the setting of demand ischemia.  #7 hypothyroidism-continue Synthroid.  #8 GERD-continue Protonix.  #9 altered mental status-this is likely metabolic encephalopathy from the DKA and is slow to improve. -Mental status is much improved and back to baseline now  DISCHARGE CONDITIONS:  stable CONSULTS OBTAINED:     DRUG ALLERGIES:   Allergies  Allergen Reactions  . Penicillins Other (See Comments)    Reaction:  Unknown    DISCHARGE MEDICATIONS:   Current Discharge Medication List    CONTINUE these medications which have NOT CHANGED   Details  insulin NPH-regular Human (HUMULIN 70/30) (70-30) 100 UNIT/ML  injection Inject 20-27 Units into the skin 2 (two) times daily. Pt uses 27 units with breakfast and 20 units with supper.    levothyroxine (SYNTHROID, LEVOTHROID) 88 MCG tablet Take 1 tablet (88 mcg total) by  mouth daily. Qty: 90 tablet, Refills: 3   Associated Diagnoses: Hypothyroidism, unspecified hypothyroidism type    pantoprazole (PROTONIX) 40 MG tablet Take 40 mg by mouth 2 (two) times daily.    diphenhydrAMINE (BENADRYL) 25 MG tablet Take 1 tablet (25 mg total) by mouth every 8 (eight) hours as needed. Qty: 30 tablet, Refills: 0   Associated Diagnoses: Dermatitis       DISCHARGE INSTRUCTIONS:   DIET:  Diabetic diet  DISCHARGE CONDITION:  Good  ACTIVITY:  Activity as tolerated  OXYGEN:  Home Oxygen: No.   Oxygen Delivery: room air  DISCHARGE LOCATION:  nursing home   If you experience worsening of your admission symptoms, develop shortness of breath, life threatening emergency, suicidal or homicidal thoughts you must seek medical attention immediately by calling 911 or calling your MD immediately  if symptoms less severe.  You Must read complete instructions/literature along with all the possible adverse reactions/side effects for all the Medicines you take and that have been prescribed to you. Take any new Medicines after you have completely understood and accpet all the possible adverse reactions/side effects.   Please note  You were cared for by a hospitalist during your hospital stay. If you have any questions about your discharge medications or the care you received while you were in the hospital after you are discharged, you can call the unit and asked to speak with the hospitalist on call if the hospitalist that took care of you is not available. Once you are discharged, your primary care physician will handle any further medical issues. Please note that NO REFILLS for any discharge medications will be authorized once you are discharged, as it is imperative that you return to your primary care physician (or establish a relationship with a primary care physician if you do not have one) for your aftercare needs so that they can reassess your need for medications and monitor  your lab values.    On the day of Discharge: VITAL SIGNS:  Blood pressure 101/50, pulse 68, temperature 98.4 F (36.9 C), temperature source Oral, resp. rate 18, height 5\' 7"  (1.702 m), weight 67.8 kg (149 lb 7.6 oz), SpO2 94 %. PHYSICAL EXAMINATION:  GENERAL:  81 y.o.-year-old patient lying in the bed with no acute distress.  EYES: Pupils equal, round, reactive to light and accommodation. No scleral icterus. Extraocular muscles intact.  HEENT: Head atraumatic, normocephalic. Oropharynx and nasopharynx clear.  NECK:  Supple, no jugular venous distention. No thyroid enlargement, no tenderness.  LUNGS: Normal breath sounds bilaterally, no wheezing, rales,rhonchi or crepitation. No use of accessory muscles of respiration.  CARDIOVASCULAR: S1, S2 normal. No murmurs, rubs, or gallops.  ABDOMEN: Soft, non-tender, non-distended. Bowel sounds present. No organomegaly or mass.  EXTREMITIES: No pedal edema, cyanosis, or clubbing.  NEUROLOGIC: Cranial nerves II through XII are intact. Muscle strength 5/5 in all extremities. Sensation intact. Gait not checked.  PSYCHIATRIC: The patient is alert and oriented x 3.  SKIN: No obvious rash, lesion, or ulcer.  DATA REVIEW:   CBC  Recent Labs Lab 06/10/15 0553  WBC 10.2  HGB 9.0*  HCT 27.7*  PLT 176    Chemistries   Recent Labs Lab 06/08/15 1231  06/10/15 0553  NA 128*  < > 139  K  6.2*  < > 3.9  CL 87*  < > 114*  CO2 8*  < > 20*  GLUCOSE 1186*  < > 149*  BUN 47*  < > 24*  CREATININE 2.56*  < > 0.86  CALCIUM 9.2  < > 8.5*  AST 24  --   --   ALT 23  --   --   ALKPHOS 131*  --   --   BILITOT 2.0*  --   --   < > = values in this interval not displayed.  Cardiac Enzymes  Recent Labs Lab 06/08/15 1231  TROPONINI 0.07*     Management plans discussed with the patient, her granddaughter Kenney Houseman who will be updating her mother who has healthcare power of attorney and they are all in agreement.  CODE STATUS: Full code  TOTAL TIME  TAKING CARE OF THIS PATIENT: 55 minutes.    [facility], Saray Capasso M.D on 06/11/2015 at 11:06 AM  Between 7am to 6pm - Pager - [phone]  After 6pm go to [email or web address] - password [email or web address] [place] [facility]  Office  [phone]  CC: Primary care physician; Rica Mast, MD Judi Cong, MD Jennings Books , M.D.

## 2015-06-11 NOTE — NC FL2 (Signed)
[place] LEVEL OF CARE SCREENING TOOL     IDENTIFICATION  Patient Name: [name] [name] Birthdate: Oct 28, 1931 Sex: female Admission Date (Current Location): 06/08/2015  [place] and [place] Number: Engineering geologist and Address:  [facility], [address] [place],  [place] [address]      Provider Number: [id number]  Attending Physician Name and Address:  Max Sane, MD  Relative Name and Phone Number:       Current Level of Care: Hospital Recommended Level of Care: [place] Prior Approval Number:    Date Approved/Denied:   PASRR Number: [id number]  Discharge Plan: SNF    Current Diagnoses: Patient Active Problem List   Diagnosis Date Noted  . Diabetic ketoacidosis ([place]) 06/08/2015  . Long term current use of insulin ([place]) 03/08/2014  . Type 2 diabetes mellitus with renal manifestations not at goal [facility]) 02/22/2014  . CKD stage 3 due to type 2 diabetes mellitus ([place]) 02/22/2014  . Unspecified constipation 12/02/2012  . Hypothyroidism 09/23/2012  . GERD (gastroesophageal reflux disease) 09/23/2012  . Statin intolerance 07/26/2012  . Anxiety 07/26/2012  . Hyperlipidemia with target LDL less than 70 12/03/2011  . Mesenteric ischemia 07/16/2011  . DM (diabetes mellitus) type II uncontrolled, periph vascular disorder ([place]) 04/03/2011  . CAD, NATIVE VESSEL 12/13/2009    Orientation ACTIVITIES/SOCIAL BLADDER RESPIRATION    Self, Time, Situation  Active Continent Normal  BEHAVIORAL SYMPTOMS/MOOD NEUROLOGICAL BOWEL NUTRITION STATUS      Continent Diet (Carb modified)  PHYSICIAN VISITS COMMUNICATION OF NEEDS Height & Weight Skin  30 days Verbally 5\' 7"  (170.2 cm) 149 lbs. Normal          AMBULATORY STATUS RESPIRATION    Assist extensive Normal      Personal Care Assistance Level of Assistance  Dressing, Bathing Bathing Assistance: Limited assistance   Dressing Assistance: Limited assistance       Functional Limitations Info                SPECIAL CARE FACTORS FREQUENCY  PT (By licensed PT)                   Additional Factors Info  Code Status, Allergies Code Status Info: Full Code Allergies Info: Penicillins           Current Medications (06/11/2015): Current Facility-Administered Medications  Medication Dose Route Frequency Provider Last Rate Last Dose  . acetaminophen (TYLENOL) tablet 650 mg  650 mg Oral Q6H PRN Henreitta Leber, MD       Or  . acetaminophen (TYLENOL) suppository 650 mg  650 mg Rectal Q6H PRN Henreitta Leber, MD      . heparin injection 5,000 Units  5,000 Units Subcutaneous 3 times per day Henreitta Leber, MD   5,000 Units at 06/11/15 1338  . insulin aspart (novoLOG) injection 1-3 Units  1-3 Units Subcutaneous 6 times per day Henreitta Leber, MD   1 Units at 06/11/15 1150  . insulin aspart (novoLOG) injection 8 Units  8 Units Subcutaneous TID Henreitta Leber, MD   8 Units at 06/11/15 1000  . insulin glargine (LANTUS) injection 20 Units  20 Units Subcutaneous Q24H Henreitta Leber, MD   20 Units at 06/11/15 0631  . levothyroxine (SYNTHROID, LEVOTHROID) tablet 88 mcg  88 mcg Oral QAC breakfast Henreitta Leber, MD   88 mcg at 06/11/15 0830  . ondansetron (ZOFRAN) tablet 4 mg  4 mg Oral Q6H PRN Belia Heman  Verdell Carmine, MD       Or  . ondansetron (ZOFRAN) injection 4 mg  4 mg Intravenous Q6H PRN Henreitta Leber, MD      . pantoprazole (PROTONIX) EC tablet 40 mg  40 mg Oral BID Henreitta Leber, MD   40 mg at 06/11/15 1001  . pneumococcal 23 valent vaccine (PNU-IMMUNE) injection 0.5 mL  0.5 mL Intramuscular Tomorrow-1000 Henreitta Leber, MD   0.5 mL at 06/11/15 1000  . sodium chloride 0.9 % injection 3 mL  3 mL Intravenous Q12H Henreitta Leber, MD   3 mL at 06/11/15 1001   Do not use this list as official medication orders. Please verify with discharge summary.  Discharge Medications:   Medication List    TAKE these medications        diphenhydrAMINE  25 MG tablet  Commonly known as:  BENADRYL  Take 1 tablet (25 mg total) by mouth every 8 (eight) hours as needed.     HUMULIN 70/30 (70-30) 100 UNIT/ML injection  Generic drug:  insulin NPH-regular Human  Inject 20-27 Units into the skin 2 (two) times daily. Pt uses 27 units with breakfast and 20 units with supper.     levothyroxine 88 MCG tablet  Commonly known as:  SYNTHROID, LEVOTHROID  Take 1 tablet (88 mcg total) by mouth daily.     pantoprazole 40 MG tablet  Commonly known as:  PROTONIX  Take 40 mg by mouth 2 (two) times daily.        Relevant Imaging Results:  Relevant Lab Results:  Recent Labs    Additional Information    Darden Dates, LCSW

## 2015-06-11 NOTE — Clinical Social Work Note (Signed)
Clinical Social Work Assessment  Patient Details  Name: [name] [name] MRN: [id number] Date of Birth: 24-Mar-1932  Date of referral:  06/11/15               Reason for consult:  Facility Placement                Permission sought to share information with:  Family Supports Permission granted to share information::  Yes, Verbal Permission Granted  Housing/Transportation Living arrangements for the past 2 months:  [place] of Information:  Patient, Adult Children Patient Interpreter Needed:  None Criminal Activity/Legal Involvement Pertinent to Current Situation/Hospitalization:  No - Comment as needed Significant Relationships:  Adult Children Lives with:  Adult Children Do you feel safe going back to the place where you live?  Yes Need for family participation in patient care:  Yes (Comment)  Care giving concerns:  No concerns identified.    Social Worker assessment / plan:  CSW met with pt's daughter to address consult. CSW introduced herself and explained role of social work. CSW also explained the process of discharging to SNF. PT is recommending STR at SNF. CSW initiated referral and provided bed offers. Pt's daughter chose [facility] Facility is able to accept pt today. Pt and family are in agreement. RN to call report and EMS will provide transportation. CSW is signing off as no further needs identified.   Employment status:  Retired Surveyor, [employer] Care PT Recommendations:  [place] / Referral to community resources:  [place]  Patient/Family's Response to care:  Pt and family were appreciative of CSW support.   Patient/Family's Understanding of and Emotional Response to Diagnosis, Current Treatment, and Prognosis:  Pt and family understand that pt would benefit from STR at SNF prior to returning home.   Emotional Assessment Appearance:  Appears stated age Attitude/Demeanor/Rapport:  Other  (Appropriate) Affect (typically observed):   Pleasant Orientation:  Oriented to Self, Oriented to  Time, Oriented to Place Alcohol / Substance use:  Never Used Psych involvement (Current and /or in the community):  No (Comment)  Discharge Needs  Concerns to be addressed:  No discharge needs identified Readmission within the last 30 days:  No Current discharge risk:  None Barriers to Discharge:  No Barriers Identified   Darden Dates, LCSW 06/11/2015, 4:24 PM

## 2015-06-11 NOTE — Clinical Social Work Placement (Signed)
   CLINICAL SOCIAL WORK PLACEMENT  NOTE  Date:  06/11/2015  Patient Details  Name: [name] [name] MRN: [id number] Date of Birth: 08/07/1931  Clinical Social Work is seeking post-discharge placement for this patient at the [place] level of care (*CSW will initial, date and re-position this form in  chart as items are completed):  Yes   Patient/family provided with [place] Work Department's list of facilities offering this level of care within the geographic area requested by the patient (or if unable, by the patient's family).  Yes   Patient/family informed of their freedom to choose among providers that offer the needed level of care, that participate in Medicare, Medicaid or managed care program needed by the patient, have an available bed and are willing to accept the patient.      Patient/family informed of [facility]'s ownership interest in [facility] and [facility], as well as of the fact that they are under no obligation to receive care at these facilities.  PASRR submitted to EDS on       PASRR number received on       Existing PASRR number confirmed on 06/11/15     FL2 transmitted to all facilities in geographic area requested by pt/family on 06/11/15     FL2 transmitted to all facilities within larger geographic area on       Patient informed that his/her managed care company has contracts with or will negotiate with certain facilities, including the following:        Yes   Patient/family informed of bed offers received.  Patient chooses bed at  [facility])     Physician recommends and patient chooses bed at  [facility])    Patient to be transferred to  [facility]) on 06/11/15.  Patient to be transferred to facility by  [facility])     Patient family notified on 06/11/15 of transfer.  Name of family member notified:   (Family at bedside)     PHYSICIAN       Additional Comment:     _______________________________________________ Darden Dates, LCSW 06/11/2015, 4:29 PM

## 2015-06-11 NOTE — Care Management Important Message (Signed)
Important Message  Patient Details  Name: [name] [name] MRN: [id number] Date of Birth: 10/03/1931   Medicare Important Message Given:  Yes    Shelbie Ammons, RN 06/11/2015, 9:00 AM

## 2015-06-11 NOTE — Progress Notes (Addendum)
Inpatient Diabetes Program Recommendations  AACE/ADA: New Consensus Statement on Inpatient Glycemic Control (2015)  Target Ranges:  Prepandial:   less than 140 mg/dL      Peak postprandial:   less than 180 mg/dL (1-2 hours)      Critically ill patients:  140 - 180 mg/dL  Results for [name] [name], [name] [name] (MRN [id number]) as of 06/11/2015 08:07  Ref. Range 06/10/2015 04:25 06/10/2015 06:59 06/10/2015 11:24 06/10/2015 13:53 06/10/2015 16:26 06/10/2015 19:54 06/10/2015 21:31 06/10/2015 23:58 06/11/2015 04:39 06/11/2015 07:13  Glucose-Capillary Latest Ref Range: 65-99 mg/dL 125 (H) 141 (H) 412 (H) 224 (H) 105 (H) 66 151 (H) 227 (H) 224 (H) 165 (H)   Results for [name] [name], [name] [name] (MRN [id number]) as of 06/11/2015 08:07  Ref. Range 06/08/2015 21:41  Hemoglobin A1C Latest Ref Range: 4.0-6.0 % 11.4 (H)   Review of Glycemic Control  Outpatient Diabetes medications: 70/30 27 units QAM with breakfast, 70/30 20 units QPM with supper Current orders for Inpatient glycemic control: Lantus 20 units Q24H, Novolog 8 units TID with meals for meal coverage, Novolog 1-3 units Q4H  Inpatient Diabetes Program Recommendations: Insulin - Basal: Noted Lantus was increased to 20 units Q24H yesterday and patient received Lantus 20 units this morning at 6:31 am. Fasting glucose is 165 mg/dl; therefore, would not recommend any changes with basal insulin at this time. Correction (SSI): Currently patient is ordered Novolog 1-3 units Q4H using the Phase 3 of the ICU Glycemic Control order set. Please discontinue current Novolog order and use the regular Glycemic Control order set to order Novolog 0-9 units TID with meals and Novolog 0-5 units QHS. HgbA1C: A1C 11.4% on 06/08/15.   06/11/15@13 :20-Spoke with patient and her family about diabetes and home regimen for diabetes control. Patient reports that she is followed by her PCP for diabetes management and currently she takes 70/30 27 units QAM before breakfast and 70/30 20 units  before supper as an outpatient for diabetes control. Patient reports that she ran out of insulin on Wednesday because she did not have a way to go pick it up. Her family states that her 70/30 insulin was picked up on Thursday and that she has everything at home she needs for diabetes. Patient states that she does not always eat within an hour of taking her insulin. Discussed 70/30 insulin and how it works. Encouraged patient to eat within 30 minutes after taking 70/30 insulin. Patient reports that she checks her glucose twice a day (before breakfast and supper). Patient states that she frequently has hypoglycemia at night. Patient treats hypoglycemia by eating graham crackers and drinking juice or milk. Discussed "Rule of 15" for hypoglycemia treatment (treat with 15 grams of carbohydrates and wait 15 minutes and recheck to ensure glucose is up over 70 mg/dl). Patient reports that she currently keeps a log book of glucose readings BID. Encouraged patient to check glucose anytime she feels it is low and at bedtime. Explained how her PCP can use the glucose log to help determine what adjustments to make with insulin.  Inquired about knowledge about A1C and patient reports that she does not know what an A1C is. Discussed A1C results (11.4% on 06/08/15) and explained what an A1C is along with goal of A1C 7% or less.  Discussed impact of nutrition, exercise, stress, sickness, and medications on diabetes control.  Patient states that she does not follow any type of diet and her family reports that she frequently eats excessive sweets. Discussed carbohydrates, carbohydrate goals per day and  meal, along with portion sizes. Placed consult for RD for further education on carb modified diet and how to read nutrition labels. Stressed importance of improving glycemic control and patient's family states they will begin fixing her meals and helping her with diabetes management. Patient verbalized understanding of information  discussed and she states that she has no further questions at this time related to diabetes.   Thanks, Barnie Alderman, RN, MSN, CDE Diabetes Coordinator Inpatient Diabetes Program [phone] (Team Pager from [place] to [place]) [phone] ([facility] office) [phone] [facility] office) [phone] [facility] office)

## 2015-06-11 NOTE — Progress Notes (Signed)
D/C teaching giving to pt. And daughter [name] [name] at bedside with teach back. Report called to reciving facility [facility] to [employer] LPN. Pt. Declined pneumonia and flu vaccine reciving facility made aware. PIV removed pt. [facility] transport called awaiting arrival

## 2015-06-12 ENCOUNTER — Telehealth: Payer: Self-pay

## 2015-06-12 ENCOUNTER — Other Ambulatory Visit: Payer: Self-pay | Admitting: *Deleted

## 2015-06-12 ENCOUNTER — Ambulatory Visit: Payer: Medicare Other | Admitting: Internal Medicine

## 2015-06-12 DIAGNOSIS — E039 Hypothyroidism, unspecified: Secondary | ICD-10-CM | POA: Diagnosis not present

## 2015-06-12 DIAGNOSIS — E1310 Other specified diabetes mellitus with ketoacidosis without coma: Secondary | ICD-10-CM

## 2015-06-12 LAB — GLUCOSE, CAPILLARY
GLUCOSE-CAPILLARY: 163 mg/dL — AB (ref 65–99)
Glucose-Capillary: 274 mg/dL — ABNORMAL HIGH (ref 65–99)
Glucose-Capillary: 322 mg/dL — ABNORMAL HIGH (ref 65–99)
Glucose-Capillary: 370 mg/dL — ABNORMAL HIGH (ref 65–99)

## 2015-06-12 NOTE — Patient Outreach (Signed)
Referral sent for  CSW to f/u with pt, discharged to [facility] (rehab)- per view in [place].   Plan to f/u with pt after discharge from [facility].       [name]   [place] Care Management  [phone]

## 2015-06-12 NOTE — Telephone Encounter (Signed)
Unable to reach patient.  TCM call attempt.  Will continue to follow.

## 2015-06-12 NOTE — Telephone Encounter (Signed)
Patient discharged from home to SNF.  Will call after discharged home.

## 2015-06-12 NOTE — Progress Notes (Signed)
RD acknowledges consult for nutrition education for DM. Pt discharged. RD mailed pt nutritional information on 06/12/2015.  Dwyane Luo, [place], LDN Pager [phone]

## 2015-06-12 NOTE — Patient Outreach (Signed)
Attempt made to contact pt  for transition of care, see if pt discharged home or went to [place] facility.  HIPPA compliant voice message left with contact number.      [name]   [place] Care Management  [phone]

## 2015-06-13 DIAGNOSIS — R109 Unspecified abdominal pain: Secondary | ICD-10-CM | POA: Diagnosis not present

## 2015-06-13 DIAGNOSIS — E119 Type 2 diabetes mellitus without complications: Secondary | ICD-10-CM | POA: Diagnosis not present

## 2015-06-13 DIAGNOSIS — R32 Unspecified urinary incontinence: Secondary | ICD-10-CM | POA: Diagnosis not present

## 2015-06-13 DIAGNOSIS — Z794 Long term (current) use of insulin: Secondary | ICD-10-CM | POA: Diagnosis not present

## 2015-06-13 LAB — CBC WITH DIFFERENTIAL/PLATELET
BASOS ABS: 0.1 10*3/uL (ref 0–0.1)
Basophils Relative: 1 %
Eosinophils Absolute: 0.4 10*3/uL (ref 0–0.7)
Eosinophils Relative: 7 %
HEMATOCRIT: 32.5 % — AB (ref 35.0–47.0)
HEMOGLOBIN: 10.8 g/dL — AB (ref 12.0–16.0)
LYMPHS ABS: 2.1 10*3/uL (ref 1.0–3.6)
LYMPHS PCT: 34 %
MCH: 31.6 pg (ref 26.0–34.0)
MCHC: 33.1 g/dL (ref 32.0–36.0)
MCV: 95.6 fL (ref 80.0–100.0)
Monocytes Absolute: 0.5 10*3/uL (ref 0.2–0.9)
Monocytes Relative: 8 %
NEUTROS ABS: 3.1 10*3/uL (ref 1.4–6.5)
NEUTROS PCT: 50 %
PLATELETS: 203 10*3/uL (ref 150–440)
RBC: 3.4 MIL/uL — AB (ref 3.80–5.20)
RDW: 14.2 % (ref 11.5–14.5)
WBC: 6.1 10*3/uL (ref 3.6–11.0)

## 2015-06-13 LAB — GLUCOSE, CAPILLARY
GLUCOSE-CAPILLARY: 103 mg/dL — AB (ref 65–99)
GLUCOSE-CAPILLARY: 207 mg/dL — AB (ref 65–99)
GLUCOSE-CAPILLARY: 57 mg/dL — AB (ref 65–99)
GLUCOSE-CAPILLARY: 88 mg/dL (ref 65–99)
Glucose-Capillary: 182 mg/dL — ABNORMAL HIGH (ref 65–99)
Glucose-Capillary: 316 mg/dL — ABNORMAL HIGH (ref 65–99)
Glucose-Capillary: 45 mg/dL — ABNORMAL LOW (ref 65–99)

## 2015-06-14 DIAGNOSIS — E119 Type 2 diabetes mellitus without complications: Secondary | ICD-10-CM | POA: Diagnosis not present

## 2015-06-14 DIAGNOSIS — R32 Unspecified urinary incontinence: Secondary | ICD-10-CM | POA: Diagnosis not present

## 2015-06-14 DIAGNOSIS — Z794 Long term (current) use of insulin: Secondary | ICD-10-CM | POA: Diagnosis not present

## 2015-06-14 DIAGNOSIS — M79671 Pain in right foot: Secondary | ICD-10-CM | POA: Diagnosis not present

## 2015-06-14 DIAGNOSIS — R109 Unspecified abdominal pain: Secondary | ICD-10-CM | POA: Diagnosis not present

## 2015-06-14 LAB — GLUCOSE, CAPILLARY
GLUCOSE-CAPILLARY: 413 mg/dL — AB (ref 65–99)
GLUCOSE-CAPILLARY: 188 mg/dL — AB (ref 65–99)
Glucose-Capillary: 442 mg/dL — ABNORMAL HIGH (ref 65–99)
Glucose-Capillary: 112 mg/dL — ABNORMAL HIGH (ref 65–99)

## 2015-06-15 DIAGNOSIS — R109 Unspecified abdominal pain: Secondary | ICD-10-CM | POA: Diagnosis not present

## 2015-06-15 DIAGNOSIS — Z794 Long term (current) use of insulin: Secondary | ICD-10-CM | POA: Diagnosis not present

## 2015-06-15 DIAGNOSIS — E119 Type 2 diabetes mellitus without complications: Secondary | ICD-10-CM | POA: Diagnosis not present

## 2015-06-15 DIAGNOSIS — R32 Unspecified urinary incontinence: Secondary | ICD-10-CM | POA: Diagnosis not present

## 2015-06-15 LAB — GLUCOSE, CAPILLARY
Glucose-Capillary: 213 mg/dL — ABNORMAL HIGH (ref 65–99)
Glucose-Capillary: 212 mg/dL — ABNORMAL HIGH (ref 65–99)

## 2015-06-16 LAB — GLUCOSE, CAPILLARY
GLUCOSE-CAPILLARY: 223 mg/dL — AB (ref 65–99)
GLUCOSE-CAPILLARY: 205 mg/dL — AB (ref 65–99)
Glucose-Capillary: 204 mg/dL — ABNORMAL HIGH (ref 65–99)
Glucose-Capillary: 130 mg/dL — ABNORMAL HIGH (ref 65–99)
Glucose-Capillary: 154 mg/dL — ABNORMAL HIGH (ref 65–99)
Glucose-Capillary: 169 mg/dL — ABNORMAL HIGH (ref 65–99)

## 2015-06-17 DIAGNOSIS — R32 Unspecified urinary incontinence: Secondary | ICD-10-CM | POA: Diagnosis not present

## 2015-06-17 DIAGNOSIS — Z794 Long term (current) use of insulin: Secondary | ICD-10-CM | POA: Diagnosis not present

## 2015-06-17 DIAGNOSIS — E119 Type 2 diabetes mellitus without complications: Secondary | ICD-10-CM | POA: Diagnosis not present

## 2015-06-17 DIAGNOSIS — R109 Unspecified abdominal pain: Secondary | ICD-10-CM | POA: Diagnosis not present

## 2015-06-17 LAB — GLUCOSE, CAPILLARY
GLUCOSE-CAPILLARY: 241 mg/dL — AB (ref 65–99)
GLUCOSE-CAPILLARY: 260 mg/dL — AB (ref 65–99)
GLUCOSE-CAPILLARY: 113 mg/dL — AB (ref 65–99)
GLUCOSE-CAPILLARY: 228 mg/dL — AB (ref 65–99)

## 2015-06-18 ENCOUNTER — Other Ambulatory Visit: Payer: Self-pay | Admitting: *Deleted

## 2015-06-18 DIAGNOSIS — E119 Type 2 diabetes mellitus without complications: Secondary | ICD-10-CM | POA: Diagnosis not present

## 2015-06-18 DIAGNOSIS — R32 Unspecified urinary incontinence: Secondary | ICD-10-CM | POA: Diagnosis not present

## 2015-06-18 DIAGNOSIS — R109 Unspecified abdominal pain: Secondary | ICD-10-CM | POA: Diagnosis not present

## 2015-06-18 DIAGNOSIS — Z794 Long term (current) use of insulin: Secondary | ICD-10-CM | POA: Diagnosis not present

## 2015-06-18 LAB — GLUCOSE, CAPILLARY
GLUCOSE-CAPILLARY: 131 mg/dL — AB (ref 65–99)
GLUCOSE-CAPILLARY: 93 mg/dL (ref 65–99)
Glucose-Capillary: 174 mg/dL — ABNORMAL HIGH (ref 65–99)
Glucose-Capillary: 118 mg/dL — ABNORMAL HIGH (ref 65–99)
Glucose-Capillary: 56 mg/dL — ABNORMAL LOW (ref 65–99)

## 2015-06-18 NOTE — Patient Outreach (Signed)
[facility]) Care Management  [facility] Social Work  06/18/2015  MORELIA CLUCK [birth year] [id number]  Subjective:  Patient is a 81 year old female.  Objective:   Current Medications:  Current Outpatient Prescriptions  Medication Sig Dispense Refill  . diphenhydrAMINE (BENADRYL) 25 MG tablet Take 1 tablet (25 mg total) by mouth every 8 (eight) hours as needed. (Patient not taking: Reported on 05/25/2015) 30 tablet 0  . insulin NPH-regular Human (HUMULIN 70/30) (70-30) 100 UNIT/ML injection Inject 20-27 Units into the skin 2 (two) times daily. Pt uses 27 units with breakfast and 20 units with supper.    . levothyroxine (SYNTHROID, LEVOTHROID) 88 MCG tablet Take 1 tablet (88 mcg total) by mouth daily. 90 tablet 3  . pantoprazole (PROTONIX) 40 MG tablet Take 40 mg by mouth 2 (two) times daily.     No current facility-administered medications for this visit.    Functional Status:  In your present state of health, do you have any difficulty performing the following activities: 06/08/2015 11/06/2014  Hearing? Tempie Donning  Vision? N N  Difficulty concentrating or making decisions? N Y  Walking or climbing stairs? N N  Dressing or bathing? N N  Doing errands, shopping? Y N  Preparing Food and eating ? - N  Using the Toilet? - N  In the past six months, have you accidently leaked urine? - N  Do you have problems with loss of bowel control? - N  Managing your Medications? - N  Housekeeping or managing your Housekeeping? - N    Fall/Depression Screening:  PHQ 2/9 Scores 08/15/2014 07/26/2012 06/15/2012  PHQ - 2 Score 0 0 3    Assessment:  Brief visit with patient at [facility] before Physical Therapy came in for treatment. Per patient, she had very little memory of the reason she was admitted to the hospital.  Patient states that she forgot to refill her insulin and did not take it for 2 days.  Patient states that her son resides with her, however her main caretaker is her  daughter and grand daughter.  Per physical therapist, the family has been discussing the possibility of the [employer] inclusive care ([facility])  for patient post discharge from the skilled nursing facility.  Per patient, her son attends this program daily as well.  This Education officer, museum also spoke with the discharge planner Caryl Pina as [phone].  No discharge date set yet.  Family care meeting set for Wednesday, discharge recommendations to be discussed at that time.  [facility] care  recommended to be discussed. Patient agrees with recommendation  Plan: This social worker will follow up with discharge planner on 06/25/15 to further discuss discharge plan.            RNCM to be notified of potential discharge plan for [facility].        Margie Billet CM Care Plan Problem One        Most Recent Value   Care Plan Problem One  Patient currently  in skilled nursinf facility   Role Documenting the Problem One  Clinical Social Worker   Care Plan for Problem One  Active   THN CM Short Term Goal #1 (0-30 days)  patient will activley participate in physical and occupational therapy for the next 30 days   THN CM Short Term Goal #1 Start Date  06/18/15   Interventions for Short Term Goal #1  encouraged active participation while in rehabilitation, discussed progress  and discharge plan with physical therapist   THN CM Short Term Goal #2 (0-30 days)  patient and clinical social worker to collaborate with discharge planner to ensure and safe and stabel discharge for the next 30 days   THN CM Short Term Goal #2 Start Date  06/18/15   Interventions for Short Term Goal #2  discharge recommendation of [facility] discussed with patient , physical  therapist, and discharge planner      Sheralyn Boatman [facility] Care Management [phone]

## 2015-06-19 DIAGNOSIS — E119 Type 2 diabetes mellitus without complications: Secondary | ICD-10-CM | POA: Diagnosis not present

## 2015-06-19 DIAGNOSIS — Z794 Long term (current) use of insulin: Secondary | ICD-10-CM | POA: Diagnosis not present

## 2015-06-19 DIAGNOSIS — R109 Unspecified abdominal pain: Secondary | ICD-10-CM | POA: Diagnosis not present

## 2015-06-19 DIAGNOSIS — R32 Unspecified urinary incontinence: Secondary | ICD-10-CM | POA: Diagnosis not present

## 2015-06-19 LAB — GLUCOSE, CAPILLARY
GLUCOSE-CAPILLARY: 321 mg/dL — AB (ref 65–99)
GLUCOSE-CAPILLARY: 118 mg/dL — AB (ref 65–99)
Glucose-Capillary: 251 mg/dL — ABNORMAL HIGH (ref 65–99)
Glucose-Capillary: 59 mg/dL — ABNORMAL LOW (ref 65–99)

## 2015-06-20 DIAGNOSIS — R32 Unspecified urinary incontinence: Secondary | ICD-10-CM | POA: Diagnosis not present

## 2015-06-20 DIAGNOSIS — Z794 Long term (current) use of insulin: Secondary | ICD-10-CM | POA: Diagnosis not present

## 2015-06-20 DIAGNOSIS — R109 Unspecified abdominal pain: Secondary | ICD-10-CM | POA: Diagnosis not present

## 2015-06-20 DIAGNOSIS — E119 Type 2 diabetes mellitus without complications: Secondary | ICD-10-CM | POA: Diagnosis not present

## 2015-06-20 LAB — GLUCOSE, CAPILLARY
GLUCOSE-CAPILLARY: 155 mg/dL — AB (ref 65–99)
GLUCOSE-CAPILLARY: 36 mg/dL — AB (ref 65–99)
Glucose-Capillary: 143 mg/dL — ABNORMAL HIGH (ref 65–99)

## 2015-06-21 DIAGNOSIS — E119 Type 2 diabetes mellitus without complications: Secondary | ICD-10-CM | POA: Diagnosis not present

## 2015-06-21 DIAGNOSIS — R32 Unspecified urinary incontinence: Secondary | ICD-10-CM | POA: Diagnosis not present

## 2015-06-21 DIAGNOSIS — Z794 Long term (current) use of insulin: Secondary | ICD-10-CM | POA: Diagnosis not present

## 2015-06-21 DIAGNOSIS — R109 Unspecified abdominal pain: Secondary | ICD-10-CM | POA: Diagnosis not present

## 2015-06-21 LAB — GLUCOSE, CAPILLARY
Glucose-Capillary: 45 mg/dL — ABNORMAL LOW (ref 65–99)
Glucose-Capillary: 227 mg/dL — ABNORMAL HIGH (ref 65–99)
Glucose-Capillary: 284 mg/dL — ABNORMAL HIGH (ref 65–99)
Glucose-Capillary: 94 mg/dL (ref 65–99)
Glucose-Capillary: 299 mg/dL — ABNORMAL HIGH (ref 65–99)

## 2015-06-22 DIAGNOSIS — Z794 Long term (current) use of insulin: Secondary | ICD-10-CM | POA: Diagnosis not present

## 2015-06-22 DIAGNOSIS — R32 Unspecified urinary incontinence: Secondary | ICD-10-CM | POA: Diagnosis not present

## 2015-06-22 DIAGNOSIS — E119 Type 2 diabetes mellitus without complications: Secondary | ICD-10-CM | POA: Diagnosis not present

## 2015-06-22 DIAGNOSIS — R109 Unspecified abdominal pain: Secondary | ICD-10-CM | POA: Diagnosis not present

## 2015-06-22 LAB — GLUCOSE, CAPILLARY
GLUCOSE-CAPILLARY: 235 mg/dL — AB (ref 65–99)
GLUCOSE-CAPILLARY: 36 mg/dL — AB (ref 65–99)
GLUCOSE-CAPILLARY: 93 mg/dL (ref 65–99)
Glucose-Capillary: 318 mg/dL — ABNORMAL HIGH (ref 65–99)
Glucose-Capillary: 43 mg/dL — CL (ref 65–99)
Glucose-Capillary: 332 mg/dL — ABNORMAL HIGH (ref 65–99)

## 2015-06-23 DIAGNOSIS — R32 Unspecified urinary incontinence: Secondary | ICD-10-CM | POA: Diagnosis not present

## 2015-06-23 DIAGNOSIS — E119 Type 2 diabetes mellitus without complications: Secondary | ICD-10-CM | POA: Diagnosis not present

## 2015-06-23 DIAGNOSIS — Z794 Long term (current) use of insulin: Secondary | ICD-10-CM | POA: Diagnosis not present

## 2015-06-23 DIAGNOSIS — R109 Unspecified abdominal pain: Secondary | ICD-10-CM | POA: Diagnosis not present

## 2015-06-23 LAB — GLUCOSE, CAPILLARY
GLUCOSE-CAPILLARY: 343 mg/dL — AB (ref 65–99)
GLUCOSE-CAPILLARY: 163 mg/dL — AB (ref 65–99)
Glucose-Capillary: 382 mg/dL — ABNORMAL HIGH (ref 65–99)
Glucose-Capillary: 90 mg/dL (ref 65–99)

## 2015-06-24 DIAGNOSIS — Z794 Long term (current) use of insulin: Secondary | ICD-10-CM | POA: Diagnosis not present

## 2015-06-24 DIAGNOSIS — R32 Unspecified urinary incontinence: Secondary | ICD-10-CM | POA: Diagnosis not present

## 2015-06-24 DIAGNOSIS — E119 Type 2 diabetes mellitus without complications: Secondary | ICD-10-CM | POA: Diagnosis not present

## 2015-06-24 DIAGNOSIS — R109 Unspecified abdominal pain: Secondary | ICD-10-CM | POA: Diagnosis not present

## 2015-06-24 LAB — GLUCOSE, CAPILLARY
GLUCOSE-CAPILLARY: 253 mg/dL — AB (ref 65–99)
GLUCOSE-CAPILLARY: 234 mg/dL — AB (ref 65–99)
GLUCOSE-CAPILLARY: 83 mg/dL (ref 65–99)
GLUCOSE-CAPILLARY: 297 mg/dL — AB (ref 65–99)

## 2015-06-25 ENCOUNTER — Ambulatory Visit: Payer: Self-pay | Admitting: *Deleted

## 2015-06-25 DIAGNOSIS — E119 Type 2 diabetes mellitus without complications: Secondary | ICD-10-CM | POA: Diagnosis not present

## 2015-06-25 DIAGNOSIS — R109 Unspecified abdominal pain: Secondary | ICD-10-CM | POA: Diagnosis not present

## 2015-06-25 DIAGNOSIS — R32 Unspecified urinary incontinence: Secondary | ICD-10-CM | POA: Diagnosis not present

## 2015-06-25 DIAGNOSIS — Z794 Long term (current) use of insulin: Secondary | ICD-10-CM | POA: Diagnosis not present

## 2015-06-25 LAB — GLUCOSE, CAPILLARY
GLUCOSE-CAPILLARY: 456 mg/dL — AB (ref 65–99)
GLUCOSE-CAPILLARY: 271 mg/dL — AB (ref 65–99)
GLUCOSE-CAPILLARY: 101 mg/dL — AB (ref 65–99)

## 2015-06-25 LAB — URINALYSIS COMPLETE WITH MICROSCOPIC (ARMC ONLY)
BACTERIA UA: NONE SEEN
Bilirubin Urine: NEGATIVE
GLUCOSE, UA: 50 mg/dL — AB
HGB URINE DIPSTICK: NEGATIVE
Ketones, ur: NEGATIVE mg/dL
LEUKOCYTES UA: NEGATIVE
Nitrite: NEGATIVE
PH: 5 (ref 5.0–8.0)
Protein, ur: NEGATIVE mg/dL
Specific Gravity, Urine: 1.014 (ref 1.005–1.030)

## 2015-06-26 DIAGNOSIS — R109 Unspecified abdominal pain: Secondary | ICD-10-CM | POA: Diagnosis not present

## 2015-06-26 DIAGNOSIS — R32 Unspecified urinary incontinence: Secondary | ICD-10-CM | POA: Diagnosis not present

## 2015-06-26 DIAGNOSIS — E119 Type 2 diabetes mellitus without complications: Secondary | ICD-10-CM | POA: Diagnosis not present

## 2015-06-26 DIAGNOSIS — Z794 Long term (current) use of insulin: Secondary | ICD-10-CM | POA: Diagnosis not present

## 2015-06-26 LAB — GLUCOSE, CAPILLARY
Glucose-Capillary: 141 mg/dL — ABNORMAL HIGH (ref 65–99)
Glucose-Capillary: 123 mg/dL — ABNORMAL HIGH (ref 65–99)
Glucose-Capillary: 57 mg/dL — ABNORMAL LOW (ref 65–99)

## 2015-06-27 ENCOUNTER — Other Ambulatory Visit: Payer: Self-pay | Admitting: *Deleted

## 2015-06-27 ENCOUNTER — Ambulatory Visit: Payer: Medicare Other | Admitting: Internal Medicine

## 2015-06-27 DIAGNOSIS — R109 Unspecified abdominal pain: Secondary | ICD-10-CM | POA: Diagnosis not present

## 2015-06-27 DIAGNOSIS — E119 Type 2 diabetes mellitus without complications: Secondary | ICD-10-CM | POA: Diagnosis not present

## 2015-06-27 DIAGNOSIS — R32 Unspecified urinary incontinence: Secondary | ICD-10-CM | POA: Diagnosis not present

## 2015-06-27 DIAGNOSIS — Z794 Long term (current) use of insulin: Secondary | ICD-10-CM | POA: Diagnosis not present

## 2015-06-27 LAB — URINE CULTURE: Culture: 50000

## 2015-06-27 LAB — GLUCOSE, CAPILLARY
GLUCOSE-CAPILLARY: 185 mg/dL — AB (ref 65–99)
Glucose-Capillary: 136 mg/dL — ABNORMAL HIGH (ref 65–99)
Glucose-Capillary: 146 mg/dL — ABNORMAL HIGH (ref 65–99)
Glucose-Capillary: 156 mg/dL — ABNORMAL HIGH (ref 65–99)
Glucose-Capillary: 73 mg/dL (ref 65–99)

## 2015-06-27 NOTE — Patient Outreach (Addendum)
  [facility]) Care Management  [facility] Social Work  06/27/2015  MIREL HUNDAL Nov 15, 1931 [id number]  Subjective:  Patient will discharge from the skilled nursing facility on 07/01/15.   Objective:   Current Medications:  Current Outpatient Prescriptions  Medication Sig Dispense Refill  . diphenhydrAMINE (BENADRYL) 25 MG tablet Take 1 tablet (25 mg total) by mouth every 8 (eight) hours as needed. (Patient not taking: Reported on 05/25/2015) 30 tablet 0  . insulin NPH-regular Human (HUMULIN 70/30) (70-30) 100 UNIT/ML injection Inject 20-27 Units into the skin 2 (two) times daily. Pt uses 27 units with breakfast and 20 units with supper.    . levothyroxine (SYNTHROID, LEVOTHROID) 88 MCG tablet Take 1 tablet (88 mcg total) by mouth daily. 90 tablet 3  . pantoprazole (PROTONIX) 40 MG tablet Take 40 mg by mouth 2 (two) times daily.     No current facility-administered medications for this visit.    Functional Status:  In your present state of health, do you have any difficulty performing the following activities: 06/08/2015 11/06/2014  Hearing? Tempie Donning  Vision? N N  Difficulty concentrating or making decisions? N Y  Walking or climbing stairs? N N  Dressing or bathing? N N  Doing errands, shopping? Y N  Preparing Food and eating ? - N  Using the Toilet? - N  In the past six months, have you accidently leaked urine? - N  Do you have problems with loss of bowel control? - N  Managing your Medications? - N  Housekeeping or managing your Housekeeping? - N    Fall/Depression Screening:  PHQ 2/9 Scores 08/15/2014 07/26/2012 06/15/2012  PHQ - 2 Score 0 0 3    Assessment:  This Education officer, museum met with discharge planner Desiree Hane at [facility].  Per discharge planner, patient will discharge home on 07/01/15.  Patient will discharge to daughter's home initially with home health through [facility].  Patient will received physical and occupational therapy as well as  nursing.  Discharge planner did mention concern about patient's and daughters ability to manage her diabetes.  Nursing ordered through [facility] to provide further education.  All inclusive care([facility]) not revisited. This Education officer, museum will also inform RNCM through [facility].  This social worker went to see patient in her room to discuss discharge for 07/01/15 however she was in physical therapy at the time.  Plan: RNCM to be notified of discharge date           This social worker will follow up with patient post discharge from the skilled nursing facility.            This social worker will discuss referral for all inclusive care with patient post discharge from the skilled nursing facility.    Sheralyn Boatman [facility] Care Management [phone]

## 2015-06-28 ENCOUNTER — Encounter
Admission: RE | Admit: 2015-06-28 | Discharge: 2015-06-28 | Disposition: A | Discharge status: Home / Self Care | Payer: Medicare Other | Source: Ambulatory Visit | Attending: Internal Medicine | Admitting: Internal Medicine

## 2015-06-28 DIAGNOSIS — E119 Type 2 diabetes mellitus without complications: Secondary | ICD-10-CM | POA: Insufficient documentation

## 2015-06-28 LAB — GLUCOSE, CAPILLARY
GLUCOSE-CAPILLARY: 469 mg/dL — AB (ref 65–99)
GLUCOSE-CAPILLARY: 297 mg/dL — AB (ref 65–99)
GLUCOSE-CAPILLARY: 274 mg/dL — AB (ref 65–99)
Glucose-Capillary: 309 mg/dL — ABNORMAL HIGH (ref 65–99)
Glucose-Capillary: 485 mg/dL — ABNORMAL HIGH (ref 65–99)
Glucose-Capillary: 58 mg/dL — ABNORMAL LOW (ref 65–99)

## 2015-06-29 DIAGNOSIS — E119 Type 2 diabetes mellitus without complications: Secondary | ICD-10-CM | POA: Diagnosis not present

## 2015-06-29 LAB — GLUCOSE, CAPILLARY
GLUCOSE-CAPILLARY: 111 mg/dL — AB (ref 65–99)
Glucose-Capillary: 330 mg/dL — ABNORMAL HIGH (ref 65–99)

## 2015-06-30 DIAGNOSIS — E119 Type 2 diabetes mellitus without complications: Secondary | ICD-10-CM | POA: Diagnosis not present

## 2015-06-30 LAB — GLUCOSE, CAPILLARY
GLUCOSE-CAPILLARY: 144 mg/dL — AB (ref 65–99)
Glucose-Capillary: 49 mg/dL — ABNORMAL LOW (ref 65–99)
Glucose-Capillary: 339 mg/dL — ABNORMAL HIGH (ref 65–99)

## 2015-07-01 LAB — GLUCOSE, CAPILLARY
GLUCOSE-CAPILLARY: 144 mg/dL — AB (ref 65–99)
GLUCOSE-CAPILLARY: 38 mg/dL — AB (ref 65–99)
GLUCOSE-CAPILLARY: 309 mg/dL — AB (ref 65–99)
GLUCOSE-CAPILLARY: 173 mg/dL — AB (ref 65–99)
Glucose-Capillary: 68 mg/dL (ref 65–99)
Glucose-Capillary: 134 mg/dL — ABNORMAL HIGH (ref 65–99)
Glucose-Capillary: 333 mg/dL — ABNORMAL HIGH (ref 65–99)
Glucose-Capillary: 122 mg/dL — ABNORMAL HIGH (ref 65–99)

## 2015-07-02 ENCOUNTER — Other Ambulatory Visit: Payer: Self-pay | Admitting: *Deleted

## 2015-07-02 ENCOUNTER — Telehealth: Payer: Self-pay | Admitting: Internal Medicine

## 2015-07-02 NOTE — Telephone Encounter (Signed)
HFU, Pt was discharged from the Nursing facility yesterday. Diagnosis is Sugar was high. Pt needs to needs a appt. No appt avail to sch. Please let me know where to sch pt. Thank You!

## 2015-07-02 NOTE — Telephone Encounter (Signed)
Dr. Gilford Rile has authorized her 1:00 time slots to be opened up for needed appointments.  Please let me know when scheduled.  Thank you.

## 2015-07-02 NOTE — Patient Outreach (Signed)
Attempt made to contact pt following skilled nursing home discharge as this RN CM was informed by [facility] CSW) that pt was to discharge to daughter's home 12/4.    Unable to leave a voice message as number called- line was busy.   Plan to try again.      [name]   [facility] Care Management  [phone]

## 2015-07-03 ENCOUNTER — Other Ambulatory Visit: Payer: Self-pay | Admitting: *Deleted

## 2015-07-03 ENCOUNTER — Telehealth: Payer: Self-pay

## 2015-07-03 NOTE — Telephone Encounter (Signed)
Unable to reach patient.  No answer. Will continue to try again.

## 2015-07-03 NOTE — Patient Outreach (Signed)
Transition of care call (week 1, pt discharged from [facility] facility 12/4): Spoke with daughter [name] (on [facility] consent form), reports pt doing good since discharge, sugar this am was 165, last night 386 but then went down to 200.  Daughter states when pt went into the hospital - her sugar was 1140. Daughter states pt's Humulin 70/30 was changed to 30 units in am, 15 units in pm, pt has not had any low readings since discharge.   Daughter states pt  not using  walker around the house.  Daughter reports pt is to stay at her home, have not heard back from [facility] to set up appointment.  Daughter states she has not heard from Home health services, called [facility] yesterday twice.  Daughter states still waiting to hear from Dr. Thomes Dinning office for pt's f/u appointment.  Daughter states nursing home was suppose to call in prescriptions for pt's medications to her pharmacy, pt has her insulin but not her Synthroid or Protonix.   RN CM discussed with daughter to f/u with nursing home about prescriptions for missing medications as well as [facility] to see when services will start to which she said would do.   RN CM discussed with daughter as  part of transition of care- doing weekly phone calls 31 days post discharge as well as a home visit.  Home visit scheduled for 12/7.    [name]   [facility] Care Management  [phone]

## 2015-07-03 NOTE — Telephone Encounter (Signed)
Transition Care Management Follow-up Telephone Call   Date discharged? 07/01/15   How have you been since you were released from the hospital? Doing ok, BS were fluxuating but doing better now.   Do you understand why you were in the hospital? Yes BS were very high   Do you understand the discharge instructions? Yes and doing okay with daughters help   Where were you discharged to? Discharged Home to stay with daughter   Items Reviewed:  Medications reviewed: Started, stopped and continuing medications appropriately  Allergies reviewed: Yes, no changes  Dietary changes reviewed: Yes, diabetic diet Referrals reviewed: Yes, neurology appointment made for January  Functional Questionnaire:   Activities of Daily Living (ADLs):   She states they are independent in the following: Going to the restroom (now has bedside commode) States they require assistance with the following: Ambulating (uses walker),  bathing, dressing, meal prep (daughter helps)   Any transportation issues/concerns?: No, daughter to bring   Any patient concerns? Yes, urinating more frequently    Confirmed importance and date/time of follow-up visits scheduled Appointment scheduled 07/05/15  Provider Appointment booked with Dr. Gilford Rile (PCP).  Confirmed with patient if condition begins to worsen call PCP or go to the ER.  Patient was given the office number and encouraged to call back with question or concerns.  : Yes, verbalized understanding

## 2015-07-04 ENCOUNTER — Other Ambulatory Visit: Payer: Self-pay | Admitting: *Deleted

## 2015-07-04 NOTE — Telephone Encounter (Signed)
Returned phone call.  Appointment for 07/05/15 at 1pm confirmed.

## 2015-07-04 NOTE — Patient Outreach (Signed)
[facility]) Care Management   07/04/2015  [name] [name] September 24, 1931 [id number]  [name] [name] is an 81 y.o. female  Subjective:  Pt reports doing good since discharge from [facility], will be staying at daughter's home.   Daughter states she went to pt's home yesterday, picked up her  medications (Protonix, Synthroid).   Pt reports prior to hospitalization, was sick/ out of her insulin for 1-2 days, on admission to hospital was told  blood sugar was 1104, basically died but they brought her back.  Pt states daughter is monitoring what she eats.  Daughter states blood sugar last night was 46,gave pt 3 peanut butter crackers/milk and it came up to 119, today 92.   Daughter states she has not been able to contact [facility] care.    Objective:   Filed Vitals:   07/04/15 1050  BP: 108/60  Pulse: 84  Resp: 16    ROS  Physical Exam  Constitutional: She is oriented to person, place, and time. She appears well-developed and well-nourished.  Cardiovascular: Normal rate and regular rhythm.   Respiratory: Breath sounds normal.  GI: Soft.  Neurological: She is alert and oriented to person, place, and time.  Psychiatric: She has a normal mood and affect. Her behavior is normal. Judgment and thought content normal.    Current Medications:  Reviewed with pt's daughter [name] [name]  Current Outpatient Prescriptions  Medication Sig Dispense Refill  . fluticasone (FLONASE) 50 MCG/ACT nasal spray Place into both nostrils daily.    . insulin NPH-regular Human (HUMULIN 70/30) (70-30) 100 UNIT/ML injection Inject 20-27 Units into the skin 2 (two) times daily. Pt uses 27 units with breakfast and 20 units with supper.    . levothyroxine (SYNTHROID, LEVOTHROID) 88 MCG tablet Take 1 tablet (88 mcg total) by mouth daily. 90 tablet 3  . pantoprazole (PROTONIX) 40 MG tablet Take 40 mg by mouth 2 (two) times daily.    . diphenhydrAMINE (BENADRYL) 25 MG tablet Take 1 tablet (25  mg total) by mouth every 8 (eight) hours as needed. (Patient not taking: Reported on 05/25/2015) 30 tablet 0   No current facility-administered medications for this visit.    Functional Status:   In your present state of health, do you have any difficulty performing the following activities: 06/08/2015 11/06/2014  Hearing? Tempie Donning  Vision? N N  Difficulty concentrating or making decisions? N Y  Walking or climbing stairs? N N  Dressing or bathing? N N  Doing errands, shopping? Y N  Preparing Food and eating ? - N  Using the Toilet? - N  In the past six months, have you accidently leaked urine? - N  Do you have problems with loss of bowel control? - N  Managing your Medications? - N  Housekeeping or managing your Housekeeping? - N    Fall/Depression Screening:    PHQ 2/9 Scores 08/15/2014 07/26/2012 06/15/2012  PHQ - 2 Score 0 0 3    Assessment:  DM-  Fluctuation in sugars.  View of pt's recorded blood sugars since hospital discharge range                                  46-399.   Need for ongoing compliance with diabetic diet.                          Need for  home health services- pt reports being weak.  (PT,OT, nursing ordered per nursing                                    Home discharge)  RN CM called [facility], spoke with Thayer Headings- was informed                                    Tried calling pt no answer/no contact number.  Provided phone to daughter who provided                                   Updated contact number, home health to f/u.                           Plan:   Pt to continue to check blood sugars 2-3 times a day, record, take insulin as ordered.             Daughter to continue to assist pt in monitoring diet.              Pt to f/u with Dr. Gilford Rile post discharge, per daughter for return call from MD office.               RN CM to f/u with pt telephonically  12/15 as part of ongoing transition of care.        [facility] CM Care Plan Problem Two        Most  Recent Value   Care Plan Problem Two  Risk for readmission- recent discharge from [place] [facility].    Role Documenting the Problem Two  Care Management Coordinator   Care Plan for Problem Two  Active   Interventions for Problem Two Long Term Goal   Discussed with daughter pt f/u with Primary Care MD post discharge, importance of medication compliance, sugars controlled    THN Long Term Goal (31-90) days  Pt would not readmit 31 days from day of discharge.    THN Long Term Goal Start Date  07/03/15   THN CM Short Term Goal #1 (0-30 days)  Prescriptions for Synthroid and Protonix would be called into pt's pharmacy within the next 2 days    THN CM Short Term Goal #1 Start Date  07/03/15   [facility] CM Short Term Goal #1 Met Date   07/04/15   Interventions for Short Term Goal #2   Discussed with daughter importance of pt taking all of her medications    THN CM Short Term Goal #2 (0-30 days)  Pt's blood sugars would be <200 in the next 30 days    THN CM Short Term Goal #2 Start Date  07/03/15   Interventions for Short Term Goal #2  Discussed with daughter importance of pt having fresh foods, avoiiding processed foods (eating out)                                  [name]   [place] Care Management  [phone]

## 2015-07-04 NOTE — Telephone Encounter (Signed)
Patient requested a call back, stated that she missed a call. Please Advise

## 2015-07-05 ENCOUNTER — Ambulatory Visit (INDEPENDENT_AMBULATORY_CARE_PROVIDER_SITE_OTHER): Payer: Medicare Other | Admitting: Internal Medicine

## 2015-07-05 ENCOUNTER — Encounter: Payer: Self-pay | Admitting: Internal Medicine

## 2015-07-05 VITALS — BP 100/63 | HR 81 | Temp 97.8°F | Ht 66.0 in | Wt 156.4 lb

## 2015-07-05 DIAGNOSIS — E1151 Type 2 diabetes mellitus with diabetic peripheral angiopathy without gangrene: Secondary | ICD-10-CM | POA: Diagnosis not present

## 2015-07-05 DIAGNOSIS — N3000 Acute cystitis without hematuria: Secondary | ICD-10-CM

## 2015-07-05 DIAGNOSIS — Z794 Long term (current) use of insulin: Secondary | ICD-10-CM | POA: Diagnosis not present

## 2015-07-05 DIAGNOSIS — N183 Chronic kidney disease, stage 3 (moderate): Secondary | ICD-10-CM | POA: Diagnosis not present

## 2015-07-05 DIAGNOSIS — I252 Old myocardial infarction: Secondary | ICD-10-CM | POA: Diagnosis not present

## 2015-07-05 DIAGNOSIS — K219 Gastro-esophageal reflux disease without esophagitis: Secondary | ICD-10-CM | POA: Diagnosis not present

## 2015-07-05 DIAGNOSIS — E871 Hypo-osmolality and hyponatremia: Secondary | ICD-10-CM | POA: Diagnosis not present

## 2015-07-05 DIAGNOSIS — E039 Hypothyroidism, unspecified: Secondary | ICD-10-CM | POA: Diagnosis not present

## 2015-07-05 DIAGNOSIS — G4733 Obstructive sleep apnea (adult) (pediatric): Secondary | ICD-10-CM | POA: Diagnosis not present

## 2015-07-05 DIAGNOSIS — E1122 Type 2 diabetes mellitus with diabetic chronic kidney disease: Secondary | ICD-10-CM | POA: Diagnosis not present

## 2015-07-05 DIAGNOSIS — E1165 Type 2 diabetes mellitus with hyperglycemia: Secondary | ICD-10-CM | POA: Diagnosis not present

## 2015-07-05 DIAGNOSIS — E1129 Type 2 diabetes mellitus with other diabetic kidney complication: Secondary | ICD-10-CM

## 2015-07-05 DIAGNOSIS — I2510 Atherosclerotic heart disease of native coronary artery without angina pectoris: Secondary | ICD-10-CM | POA: Diagnosis not present

## 2015-07-05 DIAGNOSIS — Z7984 Long term (current) use of oral hypoglycemic drugs: Secondary | ICD-10-CM | POA: Diagnosis not present

## 2015-07-05 DIAGNOSIS — J449 Chronic obstructive pulmonary disease, unspecified: Secondary | ICD-10-CM | POA: Diagnosis not present

## 2015-07-05 LAB — COMPREHENSIVE METABOLIC PANEL
ALT: 25 U/L (ref 0–35)
AST: 23 U/L (ref 0–37)
Albumin: 3.8 g/dL (ref 3.5–5.2)
Alkaline Phosphatase: 135 U/L — ABNORMAL HIGH (ref 39–117)
BILIRUBIN TOTAL: 0.3 mg/dL (ref 0.2–1.2)
BUN: 14 mg/dL (ref 6–23)
CALCIUM: 9.3 mg/dL (ref 8.4–10.5)
CHLORIDE: 102 meq/L (ref 96–112)
CO2: 27 meq/L (ref 19–32)
Creatinine, Ser: 1.06 mg/dL (ref 0.40–1.20)
GFR: 52.84 mL/min — AB (ref >60.00)
GLUCOSE: 166 mg/dL — AB (ref 70–99)
Potassium: 4.8 mEq/L (ref 3.5–5.1)
Sodium: 136 mEq/L (ref 135–145)
Total Protein: 7.2 g/dL (ref 6.0–8.3)

## 2015-07-05 MED ORDER — INSULIN NPH ISOPHANE & REGULAR (70-30) 100 UNIT/ML ~~LOC~~ SUSP: SUBCUTANEOUS | Status: DC

## 2015-07-05 NOTE — Progress Notes (Signed)
Subjective:    Patient ID: Farris Has, female    DOB: 1931-09-09, 81 y.o.   MRN: [id number]  HPI  81YO female presents for hospital follow up.  ADMITTED: 06/08/2015 DISCHARGED: 06/11/2015  DIAGNOSIS: Hyperkalemia, Elevated Troponin, Acute renal failure, Altered mental status, DKA  Discharge from SNF: 12/4  Per notes, pt was not taking insulin prior to admission. She became confused and developed nausea and vomiting. BG on arrival in the ED was >700. After admission, transitioned back to home NPH insulin regimen. However, while in SNF, they increased 70/30 to 30units at breakfast and 15 units at supper.  BG now mostly near 100-200. Few low BG near 50-60. Living with her daughter Appetite good. Feeling well. [facility] nursing has been coming out to home. OT and PT planning to start in home.  Wt Readings from Last 3 Encounters:  07/05/15 156 lb 6 oz (70.931 kg)  06/08/15 149 lb 7.6 oz (67.8 kg)  05/25/15 158 lb (71.668 kg)   BP Readings from Last 3 Encounters:  07/05/15 100/63  07/04/15 108/60  06/11/15 105/50    Past Medical History  Diagnosis Date  . Diabetes mellitus   . Cancer [facility])     2004 Right breast, found on mammogram, radiation therapy, Dr. Bryson Ha, uterine cancer,   . OSA (obstructive sleep apnea)   . Osteoporosis 02/21/09    DEXA scan showed osteoporosis with left femur T-score -2.8.  Marland Kitchen Thyroid disease     Hypothyroid  . Hyperlipidemia   . CAD (coronary artery disease)   . Anxiety state, unspecified   . Vitamin D deficiency   . Esophageal reflux   . Myocardial infarction [facility])     Cath negative except for 40% occlusion LAD.  Pt not candidate for betablocker or ACEI because of hypotension  . Hypotension   . Presbyacusis   . COPD (chronic obstructive pulmonary disease) ([place])   . Hearing loss   . IBS (irritable bowel syndrome)   . Heart burn    Family History  Problem Relation Age of Onset  . Diabetes Mother   . Heart attack Father    Past  Surgical History  Procedure Laterality Date  . Abdominal hysterectomy      uterine cancer  . Breast surgery    . Colon surgery  2013    done at [facility] History  . Marital Status: Single    Spouse Name: N/A  . Number of Children: N/A  . Years of Education: N/A   Social History Main Topics  . Smoking status: Never Smoker   . Smokeless tobacco: Never Used  . Alcohol Use: No  . Drug Use: No  . Sexual Activity: Not Asked   Other Topics Concern  . None   Social History Narrative    Review of Systems  Constitutional: Negative for fever, chills, appetite change, fatigue and unexpected weight change.  Eyes: Negative for visual disturbance.  Respiratory: Negative for shortness of breath.   Cardiovascular: Negative for chest pain and leg swelling.  Gastrointestinal: Negative for nausea, vomiting, abdominal pain, diarrhea and constipation.  Musculoskeletal: Negative for myalgias and arthralgias.  Skin: Negative for color change and rash.  Hematological: Negative for adenopathy. Does not bruise/bleed easily.  Psychiatric/Behavioral: Negative for dysphoric mood. The patient is not nervous/anxious.        Objective:    BP 100/63 mmHg  Pulse 81  Temp(Src) 97.8 F (36.6 C) (Oral)  Ht 5\' 6"  (1.676 m)  Wt 156 lb 6 oz (70.931 kg)  BMI 25.25 kg/m2  SpO2 96% Physical Exam  Constitutional: She is oriented to person, place, and time. She appears well-developed and well-nourished. No distress.  HENT:  Head: Normocephalic and atraumatic.  Right Ear: External ear normal.  Left Ear: External ear normal.  Nose: Nose normal.  Mouth/Throat: Oropharynx is clear and moist.  Eyes: Conjunctivae are normal. Pupils are equal, round, and reactive to light. Right eye exhibits no discharge. Left eye exhibits no discharge. No scleral icterus.  Neck: Normal range of motion. Neck supple. No tracheal deviation present. No thyromegaly present.  Cardiovascular: Normal rate,  regular rhythm, normal heart sounds and intact distal pulses.  Exam reveals no gallop and no friction rub.   No murmur heard. Pulmonary/Chest: Effort normal and breath sounds normal. No respiratory distress. She has no wheezes. She has no rales. She exhibits no tenderness.  Musculoskeletal: Normal range of motion. She exhibits no edema or tenderness.  Lymphadenopathy:    She has no cervical adenopathy.  Neurological: She is alert and oriented to person, place, and time. No cranial nerve deficit. She exhibits normal muscle tone. Coordination normal.  Skin: Skin is warm and dry. No rash noted. She is not diaphoretic. No erythema. No pallor.  Psychiatric: She has a normal mood and affect. Her behavior is normal. Judgment and thought content normal.          Assessment & Plan:   Problem List Items Addressed This Visit      Unprioritized   CKD stage 3 due to type 2 diabetes mellitus ([place])    Recent ARF in setting of DKA. Will repeat CMP with labs today.      Relevant Medications   insulin NPH-regular Human (HUMULIN 70/30) (70-30) 100 UNIT/ML injection   Type 2 diabetes mellitus with renal manifestations not at goal [facility]) - Primary    BG improved. Discussed importance of compliance with insulin. Will continue 70-30 30units with breakfast and 15units with supper. Follow up 4 weeks and prn.      Relevant Medications   insulin NPH-regular Human (HUMULIN 70/30) (70-30) 100 UNIT/ML injection   Other Relevant Orders   Comprehensive metabolic panel    Other Visit Diagnoses    Acute cystitis without hematuria        Relevant Orders    POCT Urinalysis Dipstick    CULTURE, URINE COMPREHENSIVE        Return in about 4 weeks (around 08/02/2015) for Recheck.

## 2015-07-05 NOTE — Progress Notes (Signed)
Pre visit review using our clinic review tool, if applicable. No additional management support is needed unless otherwise documented below in the visit note. 

## 2015-07-05 NOTE — Assessment & Plan Note (Signed)
Recent ARF in setting of DKA. Will repeat CMP with labs today.

## 2015-07-05 NOTE — Patient Instructions (Addendum)
Labs today.  Continue Humalin 70-30, take 30units at breakfast and 15 units at supper.  Follow up in 4 weeks.

## 2015-07-05 NOTE — Assessment & Plan Note (Signed)
BG improved. Discussed importance of compliance with insulin. Will continue 70-30 30units with breakfast and 15units with supper. Follow up 4 weeks and prn.

## 2015-07-06 ENCOUNTER — Other Ambulatory Visit: Payer: Self-pay | Admitting: *Deleted

## 2015-07-06 NOTE — Patient Outreach (Signed)
[facility]) Care Management  07/06/2015  [name] [name] 1932/03/27 [id number]     Phone call to patient to assess for continued social work involvement.  Per patient, she will be living with her daughter permanently and is now in the process of moving to [facility]  Per patient, she has contacted [employer] inclusive) care in [place], however due to her move to [facility], she will have to go Program of all inclusive care( [facility]) in [place].  Per patient, she has all of the contact information and will be contacted them once she is moved in with her daughter.  Patient verbalized no  need for further social work involvement.   Plan:  Case to be closed to social work at this time.           Patient to contact this social worker if further needs arise in the future.     Sheralyn Boatman [facility] Care Management [phone]

## 2015-07-10 ENCOUNTER — Telehealth: Payer: Self-pay | Admitting: Internal Medicine

## 2015-07-10 DIAGNOSIS — E1151 Type 2 diabetes mellitus with diabetic peripheral angiopathy without gangrene: Secondary | ICD-10-CM | POA: Diagnosis not present

## 2015-07-10 DIAGNOSIS — E1122 Type 2 diabetes mellitus with diabetic chronic kidney disease: Secondary | ICD-10-CM | POA: Diagnosis not present

## 2015-07-10 DIAGNOSIS — N183 Chronic kidney disease, stage 3 (moderate): Secondary | ICD-10-CM | POA: Diagnosis not present

## 2015-07-10 DIAGNOSIS — I252 Old myocardial infarction: Secondary | ICD-10-CM | POA: Diagnosis not present

## 2015-07-10 DIAGNOSIS — Z794 Long term (current) use of insulin: Secondary | ICD-10-CM | POA: Diagnosis not present

## 2015-07-10 DIAGNOSIS — E871 Hypo-osmolality and hyponatremia: Secondary | ICD-10-CM | POA: Diagnosis not present

## 2015-07-10 DIAGNOSIS — Z7984 Long term (current) use of oral hypoglycemic drugs: Secondary | ICD-10-CM | POA: Diagnosis not present

## 2015-07-10 DIAGNOSIS — E039 Hypothyroidism, unspecified: Secondary | ICD-10-CM | POA: Diagnosis not present

## 2015-07-10 DIAGNOSIS — G4733 Obstructive sleep apnea (adult) (pediatric): Secondary | ICD-10-CM | POA: Diagnosis not present

## 2015-07-10 DIAGNOSIS — E1165 Type 2 diabetes mellitus with hyperglycemia: Secondary | ICD-10-CM | POA: Diagnosis not present

## 2015-07-10 DIAGNOSIS — J449 Chronic obstructive pulmonary disease, unspecified: Secondary | ICD-10-CM | POA: Diagnosis not present

## 2015-07-10 DIAGNOSIS — I2510 Atherosclerotic heart disease of native coronary artery without angina pectoris: Secondary | ICD-10-CM | POA: Diagnosis not present

## 2015-07-10 DIAGNOSIS — K219 Gastro-esophageal reflux disease without esophagitis: Secondary | ICD-10-CM | POA: Diagnosis not present

## 2015-07-10 NOTE — Telephone Encounter (Signed)
Yes. As I said in earlier message. Please decrease 70/30 insulin to 25units bid and make a follow up this week or next.

## 2015-07-10 NOTE — Telephone Encounter (Signed)
Let's cut back her 70/30 to 25units bid. And make a follow up this week or next

## 2015-07-10 NOTE — Telephone Encounter (Signed)
Attempted to call Home nurse.  Left her a message.  Spoke with patients daughter.  She is concerned about her mom's blood sugars.  Example given: Last night her sugar was 41, gave her a snack and got it up to 69 and patient went to bed.  Woke up this morning and her sugar was in the 40 again.  Had her have a snack, got it up to 112 then had her normal breakfast per the carb counting and gave insulin shot (30units of NPH). Rechecked sugar and it was 460 and it is slowly decreasing.  The daughter is trying to follow the carb counting and trying to get her mom to have a snack at bedtime.  Please advise what she should do.  Thanks

## 2015-07-10 NOTE — Telephone Encounter (Signed)
Erline Levine from [place] called she was at the patient's home to give PT . The patient informed her that her blood sugar had dropped to the 40's this morning by the time the therapist got there this afternoon  She stated that her blood sugar was in the 400. Erline Levine stated that she has been going through this since she was released from the hospital. If you can not reach Erline Levine the home health person call the patient's daughter per Erline Levine her number is [phone].

## 2015-07-10 NOTE — Telephone Encounter (Signed)
Erline Levine called back returning your call. Thank you!

## 2015-07-10 NOTE — Telephone Encounter (Signed)
Daughter states that her mom sugar has been dropping, it dropped to 40 after her insulin shot. She wants to know if 30 units is too much? Please advise.

## 2015-07-11 DIAGNOSIS — I252 Old myocardial infarction: Secondary | ICD-10-CM | POA: Diagnosis not present

## 2015-07-11 DIAGNOSIS — E1165 Type 2 diabetes mellitus with hyperglycemia: Secondary | ICD-10-CM | POA: Diagnosis not present

## 2015-07-11 DIAGNOSIS — E1151 Type 2 diabetes mellitus with diabetic peripheral angiopathy without gangrene: Secondary | ICD-10-CM | POA: Diagnosis not present

## 2015-07-11 DIAGNOSIS — N183 Chronic kidney disease, stage 3 (moderate): Secondary | ICD-10-CM | POA: Diagnosis not present

## 2015-07-11 DIAGNOSIS — J449 Chronic obstructive pulmonary disease, unspecified: Secondary | ICD-10-CM | POA: Diagnosis not present

## 2015-07-11 DIAGNOSIS — G4733 Obstructive sleep apnea (adult) (pediatric): Secondary | ICD-10-CM | POA: Diagnosis not present

## 2015-07-11 DIAGNOSIS — K219 Gastro-esophageal reflux disease without esophagitis: Secondary | ICD-10-CM | POA: Diagnosis not present

## 2015-07-11 DIAGNOSIS — Z7984 Long term (current) use of oral hypoglycemic drugs: Secondary | ICD-10-CM | POA: Diagnosis not present

## 2015-07-11 DIAGNOSIS — E039 Hypothyroidism, unspecified: Secondary | ICD-10-CM | POA: Diagnosis not present

## 2015-07-11 DIAGNOSIS — I2510 Atherosclerotic heart disease of native coronary artery without angina pectoris: Secondary | ICD-10-CM | POA: Diagnosis not present

## 2015-07-11 DIAGNOSIS — E871 Hypo-osmolality and hyponatremia: Secondary | ICD-10-CM | POA: Diagnosis not present

## 2015-07-11 DIAGNOSIS — Z794 Long term (current) use of insulin: Secondary | ICD-10-CM | POA: Diagnosis not present

## 2015-07-11 DIAGNOSIS — E1122 Type 2 diabetes mellitus with diabetic chronic kidney disease: Secondary | ICD-10-CM | POA: Diagnosis not present

## 2015-07-11 NOTE — Telephone Encounter (Signed)
CMA, [place] spoke with the patients daughter.

## 2015-07-12 ENCOUNTER — Other Ambulatory Visit: Payer: Self-pay | Admitting: *Deleted

## 2015-07-12 NOTE — Patient Outreach (Signed)
Transition of care call (SNF discharge- week 2):  Spoke with daughter [name], reports pt's sugars have been dropping.  Discussed with daughter RN CM viewed  in Epic  where MD was notified.  Daughter reports MD changed pt's insulin (70/30) to 25 units twice a day, started yesterday but not seeing a difference.  Daughter reports pt's sugar was low last night (does not have access to results at this time- taking pt to beauty shop), can't understand because pt had lasagna for dinner last night.   Pt relayed to daughter sugar was low this am.  RN CM discussed with daughter importance of protein with each meal,snack  To maintain blood sugars.  Daughter states pt to f/u with Dr. Gilford Rile 12/20.  Discussed with daughter if sugars continue to be low, call MD to which daughter agreed.   Plan to f/u with pt again telephonically 12/20 as part of ongoing transition of care.    [name]   [facility]  [phone]

## 2015-07-13 ENCOUNTER — Telehealth: Payer: Self-pay | Admitting: Internal Medicine

## 2015-07-13 DIAGNOSIS — Z794 Long term (current) use of insulin: Secondary | ICD-10-CM | POA: Diagnosis not present

## 2015-07-13 DIAGNOSIS — J449 Chronic obstructive pulmonary disease, unspecified: Secondary | ICD-10-CM | POA: Diagnosis not present

## 2015-07-13 DIAGNOSIS — Z7984 Long term (current) use of oral hypoglycemic drugs: Secondary | ICD-10-CM | POA: Diagnosis not present

## 2015-07-13 DIAGNOSIS — E039 Hypothyroidism, unspecified: Secondary | ICD-10-CM | POA: Diagnosis not present

## 2015-07-13 DIAGNOSIS — E1165 Type 2 diabetes mellitus with hyperglycemia: Secondary | ICD-10-CM | POA: Diagnosis not present

## 2015-07-13 DIAGNOSIS — N183 Chronic kidney disease, stage 3 (moderate): Secondary | ICD-10-CM | POA: Diagnosis not present

## 2015-07-13 DIAGNOSIS — I252 Old myocardial infarction: Secondary | ICD-10-CM | POA: Diagnosis not present

## 2015-07-13 DIAGNOSIS — I2510 Atherosclerotic heart disease of native coronary artery without angina pectoris: Secondary | ICD-10-CM | POA: Diagnosis not present

## 2015-07-13 DIAGNOSIS — K219 Gastro-esophageal reflux disease without esophagitis: Secondary | ICD-10-CM | POA: Diagnosis not present

## 2015-07-13 DIAGNOSIS — G4733 Obstructive sleep apnea (adult) (pediatric): Secondary | ICD-10-CM | POA: Diagnosis not present

## 2015-07-13 DIAGNOSIS — E871 Hypo-osmolality and hyponatremia: Secondary | ICD-10-CM | POA: Diagnosis not present

## 2015-07-13 DIAGNOSIS — E1122 Type 2 diabetes mellitus with diabetic chronic kidney disease: Secondary | ICD-10-CM | POA: Diagnosis not present

## 2015-07-13 DIAGNOSIS — E1151 Type 2 diabetes mellitus with diabetic peripheral angiopathy without gangrene: Secondary | ICD-10-CM | POA: Diagnosis not present

## 2015-07-13 NOTE — Telephone Encounter (Signed)
Please advise 

## 2015-07-13 NOTE — Telephone Encounter (Signed)
Catheran from [facility], [phone]. Juliann Pulse please call her. Pt continues to have very low blood surger.

## 2015-07-13 NOTE — Telephone Encounter (Signed)
If she continues to have low BG, then we can reduce her 70/30 insulin to 20 units twice daily. She should call if any blood sugars <70.

## 2015-07-13 NOTE — Telephone Encounter (Signed)
Left [name] [name] a voicemail and told her to callback when she was able.

## 2015-07-15 DIAGNOSIS — I2510 Atherosclerotic heart disease of native coronary artery without angina pectoris: Secondary | ICD-10-CM | POA: Diagnosis not present

## 2015-07-15 DIAGNOSIS — E871 Hypo-osmolality and hyponatremia: Secondary | ICD-10-CM | POA: Diagnosis not present

## 2015-07-15 DIAGNOSIS — K219 Gastro-esophageal reflux disease without esophagitis: Secondary | ICD-10-CM | POA: Diagnosis not present

## 2015-07-15 DIAGNOSIS — G4733 Obstructive sleep apnea (adult) (pediatric): Secondary | ICD-10-CM | POA: Diagnosis not present

## 2015-07-15 DIAGNOSIS — J449 Chronic obstructive pulmonary disease, unspecified: Secondary | ICD-10-CM | POA: Diagnosis not present

## 2015-07-15 DIAGNOSIS — Z794 Long term (current) use of insulin: Secondary | ICD-10-CM | POA: Diagnosis not present

## 2015-07-15 DIAGNOSIS — E1165 Type 2 diabetes mellitus with hyperglycemia: Secondary | ICD-10-CM | POA: Diagnosis not present

## 2015-07-15 DIAGNOSIS — N183 Chronic kidney disease, stage 3 (moderate): Secondary | ICD-10-CM | POA: Diagnosis not present

## 2015-07-15 DIAGNOSIS — I252 Old myocardial infarction: Secondary | ICD-10-CM | POA: Diagnosis not present

## 2015-07-15 DIAGNOSIS — E1151 Type 2 diabetes mellitus with diabetic peripheral angiopathy without gangrene: Secondary | ICD-10-CM | POA: Diagnosis not present

## 2015-07-15 DIAGNOSIS — E039 Hypothyroidism, unspecified: Secondary | ICD-10-CM | POA: Diagnosis not present

## 2015-07-15 DIAGNOSIS — Z7984 Long term (current) use of oral hypoglycemic drugs: Secondary | ICD-10-CM | POA: Diagnosis not present

## 2015-07-15 DIAGNOSIS — E1122 Type 2 diabetes mellitus with diabetic chronic kidney disease: Secondary | ICD-10-CM | POA: Diagnosis not present

## 2015-07-16 ENCOUNTER — Telehealth: Payer: Self-pay | Admitting: *Deleted

## 2015-07-16 DIAGNOSIS — E1165 Type 2 diabetes mellitus with hyperglycemia: Secondary | ICD-10-CM | POA: Diagnosis not present

## 2015-07-16 DIAGNOSIS — E1122 Type 2 diabetes mellitus with diabetic chronic kidney disease: Secondary | ICD-10-CM | POA: Diagnosis not present

## 2015-07-16 DIAGNOSIS — I252 Old myocardial infarction: Secondary | ICD-10-CM | POA: Diagnosis not present

## 2015-07-16 DIAGNOSIS — Z7984 Long term (current) use of oral hypoglycemic drugs: Secondary | ICD-10-CM | POA: Diagnosis not present

## 2015-07-16 DIAGNOSIS — I2510 Atherosclerotic heart disease of native coronary artery without angina pectoris: Secondary | ICD-10-CM | POA: Diagnosis not present

## 2015-07-16 DIAGNOSIS — J449 Chronic obstructive pulmonary disease, unspecified: Secondary | ICD-10-CM | POA: Diagnosis not present

## 2015-07-16 DIAGNOSIS — G4733 Obstructive sleep apnea (adult) (pediatric): Secondary | ICD-10-CM | POA: Diagnosis not present

## 2015-07-16 DIAGNOSIS — N183 Chronic kidney disease, stage 3 (moderate): Secondary | ICD-10-CM | POA: Diagnosis not present

## 2015-07-16 DIAGNOSIS — E871 Hypo-osmolality and hyponatremia: Secondary | ICD-10-CM | POA: Diagnosis not present

## 2015-07-16 DIAGNOSIS — E039 Hypothyroidism, unspecified: Secondary | ICD-10-CM | POA: Diagnosis not present

## 2015-07-16 DIAGNOSIS — K219 Gastro-esophageal reflux disease without esophagitis: Secondary | ICD-10-CM | POA: Diagnosis not present

## 2015-07-16 DIAGNOSIS — E1151 Type 2 diabetes mellitus with diabetic peripheral angiopathy without gangrene: Secondary | ICD-10-CM | POA: Diagnosis not present

## 2015-07-16 DIAGNOSIS — Z794 Long term (current) use of insulin: Secondary | ICD-10-CM | POA: Diagnosis not present

## 2015-07-16 NOTE — Telephone Encounter (Signed)
[name] [name], from [facility] wanted to [facility] Dr. Gilford Rile that the patient had a blood sugar of 115 after all medications were given to her.

## 2015-07-17 ENCOUNTER — Encounter: Payer: Self-pay | Admitting: Internal Medicine

## 2015-07-17 ENCOUNTER — Other Ambulatory Visit: Payer: Self-pay | Admitting: *Deleted

## 2015-07-17 ENCOUNTER — Ambulatory Visit (INDEPENDENT_AMBULATORY_CARE_PROVIDER_SITE_OTHER): Payer: Medicare Other | Admitting: Internal Medicine

## 2015-07-17 ENCOUNTER — Ambulatory Visit: Payer: Self-pay | Admitting: *Deleted

## 2015-07-17 VITALS — BP 118/66 | HR 81 | Temp 97.9°F | Ht 66.0 in | Wt 161.0 lb

## 2015-07-17 DIAGNOSIS — E1129 Type 2 diabetes mellitus with other diabetic kidney complication: Secondary | ICD-10-CM | POA: Diagnosis not present

## 2015-07-17 DIAGNOSIS — R110 Nausea: Secondary | ICD-10-CM | POA: Diagnosis not present

## 2015-07-17 LAB — CBC
HEMATOCRIT: 32.8 % — AB (ref 36.0–46.0)
Hemoglobin: 10.8 g/dL — ABNORMAL LOW (ref 12.0–15.0)
MCHC: 33 g/dL (ref 30.0–36.0)
MCV: 96.3 fl (ref 78.0–100.0)
PLATELETS: 272 10*3/uL (ref 150.0–400.0)
RBC: 3.4 Mil/uL — AB (ref 3.87–5.11)
RDW: 15.1 % (ref 11.5–15.5)
WBC: 5.1 10*3/uL (ref 4.0–10.5)

## 2015-07-17 LAB — LIPASE: Lipase: 28 U/L (ref 11.0–59.0)

## 2015-07-17 MED ORDER — INSULIN NPH ISOPHANE & REGULAR (70-30) 100 UNIT/ML ~~LOC~~ SUSP: SUBCUTANEOUS | Status: DC

## 2015-07-17 NOTE — Patient Outreach (Signed)
Attempt made to contact pt (daughter [name]'s cell phone -contact for pt) as part of transition of care (week 3).  HIPPA compliant voice message left with contact number.   View in Epic schedule noted pt to f/u with Dr. Gilford Rile later today.    If no response, will try again.     [name]   [facility]  [phone]

## 2015-07-17 NOTE — Progress Notes (Signed)
Pre visit review using our clinic review tool, if applicable. No additional management support is needed unless otherwise documented below in the visit note. 

## 2015-07-17 NOTE — Progress Notes (Signed)
Subjective:    Patient ID: [name] [name] [name], female    DOB: 09/21/1931, 81 y.o.   MRN: [id number]  HPI  81YO female presents for follow up.  DM - BG have been low. 27 this morning. Her daughter [name] [name] cut down 70/30 to 20units bid. BG have been low even after meals. However still seeing some readings in 300-500 range.  Daughter [name] [name] been giving her mother the insulin in the Humalin 70/30 pens. Pt now living with her daughter, eating much healthier diet, lower in sugar. No recent illnesses. No fever. Feels cold most of the time. Notes some nausea but no vomiting. Denies any urinary symptoms.   Wt Readings from Last 3 Encounters:  07/17/15 161 lb (73.029 kg)  07/05/15 156 lb 6 oz (70.931 kg)  06/08/15 149 lb 7.6 oz (67.8 kg)   BP Readings from Last 3 Encounters:  07/17/15 118/66  07/05/15 100/63  07/04/15 108/60    Past Medical History  Diagnosis Date  . Diabetes mellitus   . Cancer [facility])     2004 Right breast, found on mammogram, radiation therapy, Dr. Bryson Ha, uterine cancer,   . OSA (obstructive sleep apnea)   . Osteoporosis 02/21/09    DEXA scan showed osteoporosis with left femur T-score -2.8.  Marland Kitchen Thyroid disease     Hypothyroid  . Hyperlipidemia   . CAD (coronary artery disease)   . Anxiety state, unspecified   . Vitamin D deficiency   . Esophageal reflux   . Myocardial infarction [facility])     Cath negative except for 40% occlusion LAD.  Pt not candidate for betablocker or ACEI because of hypotension  . Hypotension   . Presbyacusis   . COPD (chronic obstructive pulmonary disease) ([place])   . Hearing loss   . IBS (irritable bowel syndrome)   . Heart burn    Family History  Problem Relation Age of Onset  . Diabetes Mother   . Heart attack Father    Past Surgical History  Procedure Laterality Date  . Abdominal hysterectomy      uterine cancer  . Breast surgery    . Colon surgery  2013    done at [place] History  . Marital Status: Single    Spouse Name: N/A  . Number of Children: N/A  . Years of Education: N/A   Social History Main Topics  . Smoking status: Never Smoker   . Smokeless tobacco: Never Used  . Alcohol Use: No  . Drug Use: No  . Sexual Activity: Not Asked   Other Topics Concern  . None   Social History Narrative    Review of Systems  Constitutional: Positive for fatigue. Negative for fever, chills, appetite change and unexpected weight change.  Eyes: Negative for visual disturbance.  Respiratory: Negative for shortness of breath.   Cardiovascular: Negative for chest pain and leg swelling.  Gastrointestinal: Positive for nausea. Negative for vomiting, abdominal pain, diarrhea and constipation.  Endocrine: Positive for cold intolerance.  Musculoskeletal: Negative for myalgias and arthralgias.  Skin: Negative for color change and rash.  Hematological: Negative for adenopathy. Does not bruise/bleed easily.  Psychiatric/Behavioral: Negative for sleep disturbance and dysphoric mood. The patient is not nervous/anxious.        Objective:    BP 118/66 mmHg  Pulse 81  Temp(Src) 97.9 F (36.6 C) (Oral)  Ht 5\' 6"  (1.676 m)  Wt 161 lb (73.029 kg)  BMI 26.00 kg/m2  SpO2 98% Physical Exam  Constitutional: She is oriented to person, place, and time. She appears well-developed and well-nourished. No distress.  HENT:  Head: Normocephalic and atraumatic.  Right Ear: External ear normal.  Left Ear: External ear normal.  Nose: Nose normal.  Mouth/Throat: Oropharynx is clear and moist. No oropharyngeal exudate.  Eyes: Conjunctivae are normal. Pupils are equal, round, and reactive to light. Right eye exhibits no discharge. Left eye exhibits no discharge. No scleral icterus.  Neck: Normal range of motion. Neck supple. No tracheal deviation present. No thyromegaly present.  Cardiovascular: Normal rate, regular rhythm, normal heart sounds and intact distal pulses.  Exam reveals no gallop and no friction rub.   No  murmur heard. Pulmonary/Chest: Effort normal and breath sounds normal. No respiratory distress. She [name] [name] no wheezes. She [name] [name] no rales. She exhibits no tenderness.  Musculoskeletal: Normal range of motion. She exhibits no edema or tenderness.  Lymphadenopathy:    She [name] [name] no cervical adenopathy.  Neurological: She is alert and oriented to person, place, and time. No cranial nerve deficit. She exhibits normal muscle tone. Coordination normal.  Skin: Skin is warm and dry. No rash noted. She is not diaphoretic. No erythema. No pallor.  Psychiatric: She [name] [name] a normal mood and affect. Her behavior is normal. Judgment and thought content normal.          Assessment & Plan:   Problem List Items Addressed This Visit      Unprioritized   Nausea without vomiting    Recent mild nausea. Likely related to hypoglycemia. Will check CBC, lipase, UA. Recent CMP was normal. Follow up if symptoms are not improving with improved BG.      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 Pt now living with daughter who is preparing meals which are healthier. Will decrease Humalin 70-30 to 15units with breakfast and 10 units with dinner. Daughter will call if any more BG<50. Follow up in 1-2 weeks.      Relevant Medications   insulin NPH-regular Human (HUMULIN 70/30) (70-30) 100 UNIT/ML injection       Return in about 1 week (around 07/24/2015) for Recheck.

## 2015-07-17 NOTE — Assessment & Plan Note (Signed)
BG have been much lower. Pt now living with daughter who is preparing meals which are healthier. Will decrease Humalin 70-30 to 15units with breakfast and 10 units with dinner. Daughter will call if any more BG<50. Follow up in 1-2 weeks.

## 2015-07-17 NOTE — Assessment & Plan Note (Signed)
Recent mild nausea. Likely related to hypoglycemia. Will check CBC, lipase, UA. Recent CMP was normal. Follow up if symptoms are not improving with improved BG.

## 2015-07-17 NOTE — Telephone Encounter (Signed)
Home health nurse will lower dosage.

## 2015-07-17 NOTE — Patient Outreach (Signed)
Transition of care call (week 3- Skilled nursing home discharge):  Returned call to daughter as RN CM left voice message earlier.  Daughter reports pt had a bad day yesterday- sugar 36, dropped to 27, gave pt candy bar, 20 oz of [facility]- 3 hours later came up to 146. Daughter states not happy with pt's blood sugars, good all day,drop at night.  Daughter states MD changed pt's insulin 70/30 to 20 units in am, 20 units in pm.  RN CM discussed importance of  protein with each meal/snack to which daughter states gives pt peanut butter and crackers at night but does not seem to working anymore, plan to get cheese  crackers.  RN CM discussed wheat crackers and a slice of cheese as a better option.  Daughter states pt is to f/u with Dr. Gilford Rile today,to talk to MD as  can't have any more sugars drop to 27.   As discussed, RN CM to f/u again 12/27 telephonically as part of ongoing transition of care.    [name]   [facility]  [phone]

## 2015-07-17 NOTE — Telephone Encounter (Signed)
This being the second voicemail with no response from [facility].

## 2015-07-17 NOTE — Patient Instructions (Signed)
Decrease Humalin 70/30 to 15units at breakfast and 10 units at dinner.  Call if any blood sugars less than 50.  Follow up 2 weeks.

## 2015-07-18 DIAGNOSIS — K219 Gastro-esophageal reflux disease without esophagitis: Secondary | ICD-10-CM | POA: Diagnosis not present

## 2015-07-18 DIAGNOSIS — J449 Chronic obstructive pulmonary disease, unspecified: Secondary | ICD-10-CM | POA: Diagnosis not present

## 2015-07-18 DIAGNOSIS — Z7984 Long term (current) use of oral hypoglycemic drugs: Secondary | ICD-10-CM | POA: Diagnosis not present

## 2015-07-18 DIAGNOSIS — E1122 Type 2 diabetes mellitus with diabetic chronic kidney disease: Secondary | ICD-10-CM | POA: Diagnosis not present

## 2015-07-18 DIAGNOSIS — I252 Old myocardial infarction: Secondary | ICD-10-CM | POA: Diagnosis not present

## 2015-07-18 DIAGNOSIS — N183 Chronic kidney disease, stage 3 (moderate): Secondary | ICD-10-CM | POA: Diagnosis not present

## 2015-07-18 DIAGNOSIS — E1165 Type 2 diabetes mellitus with hyperglycemia: Secondary | ICD-10-CM | POA: Diagnosis not present

## 2015-07-18 DIAGNOSIS — E1151 Type 2 diabetes mellitus with diabetic peripheral angiopathy without gangrene: Secondary | ICD-10-CM | POA: Diagnosis not present

## 2015-07-18 DIAGNOSIS — E871 Hypo-osmolality and hyponatremia: Secondary | ICD-10-CM | POA: Diagnosis not present

## 2015-07-18 DIAGNOSIS — G4733 Obstructive sleep apnea (adult) (pediatric): Secondary | ICD-10-CM | POA: Diagnosis not present

## 2015-07-18 DIAGNOSIS — E039 Hypothyroidism, unspecified: Secondary | ICD-10-CM | POA: Diagnosis not present

## 2015-07-18 DIAGNOSIS — Z794 Long term (current) use of insulin: Secondary | ICD-10-CM | POA: Diagnosis not present

## 2015-07-18 DIAGNOSIS — I2510 Atherosclerotic heart disease of native coronary artery without angina pectoris: Secondary | ICD-10-CM | POA: Diagnosis not present

## 2015-07-20 ENCOUNTER — Other Ambulatory Visit (INDEPENDENT_AMBULATORY_CARE_PROVIDER_SITE_OTHER): Payer: Medicare Other | Admitting: *Deleted

## 2015-07-20 DIAGNOSIS — I252 Old myocardial infarction: Secondary | ICD-10-CM | POA: Diagnosis not present

## 2015-07-20 DIAGNOSIS — I2510 Atherosclerotic heart disease of native coronary artery without angina pectoris: Secondary | ICD-10-CM | POA: Diagnosis not present

## 2015-07-20 DIAGNOSIS — E871 Hypo-osmolality and hyponatremia: Secondary | ICD-10-CM | POA: Diagnosis not present

## 2015-07-20 DIAGNOSIS — Z7984 Long term (current) use of oral hypoglycemic drugs: Secondary | ICD-10-CM | POA: Diagnosis not present

## 2015-07-20 DIAGNOSIS — E039 Hypothyroidism, unspecified: Secondary | ICD-10-CM | POA: Diagnosis not present

## 2015-07-20 DIAGNOSIS — K219 Gastro-esophageal reflux disease without esophagitis: Secondary | ICD-10-CM | POA: Diagnosis not present

## 2015-07-20 DIAGNOSIS — N390 Urinary tract infection, site not specified: Secondary | ICD-10-CM

## 2015-07-20 DIAGNOSIS — E1165 Type 2 diabetes mellitus with hyperglycemia: Secondary | ICD-10-CM | POA: Diagnosis not present

## 2015-07-20 DIAGNOSIS — G4733 Obstructive sleep apnea (adult) (pediatric): Secondary | ICD-10-CM | POA: Diagnosis not present

## 2015-07-20 DIAGNOSIS — Z794 Long term (current) use of insulin: Secondary | ICD-10-CM | POA: Diagnosis not present

## 2015-07-20 DIAGNOSIS — E1151 Type 2 diabetes mellitus with diabetic peripheral angiopathy without gangrene: Secondary | ICD-10-CM | POA: Diagnosis not present

## 2015-07-20 DIAGNOSIS — E1122 Type 2 diabetes mellitus with diabetic chronic kidney disease: Secondary | ICD-10-CM | POA: Diagnosis not present

## 2015-07-20 DIAGNOSIS — J449 Chronic obstructive pulmonary disease, unspecified: Secondary | ICD-10-CM | POA: Diagnosis not present

## 2015-07-20 DIAGNOSIS — N183 Chronic kidney disease, stage 3 (moderate): Secondary | ICD-10-CM | POA: Diagnosis not present

## 2015-07-20 LAB — URINALYSIS, ROUTINE W REFLEX MICROSCOPIC
Bilirubin Urine: NEGATIVE
HGB URINE DIPSTICK: NEGATIVE
KETONES UR: NEGATIVE
LEUKOCYTES UA: NEGATIVE
NITRITE: POSITIVE — AB
SPECIFIC GRAVITY, URINE: 1.025 (ref 1.000–1.030)
TOTAL PROTEIN, URINE-UPE24: NEGATIVE
Urobilinogen, UA: 0.2 (ref 0.0–1.0)
pH: 5.5 (ref 5.0–8.0)

## 2015-07-20 MED ORDER — CIPROFLOXACIN HCL 250 MG PO TABS: 250.0000 mg | ORAL_TABLET | Freq: Two times a day (BID) | ORAL | Status: DC

## 2015-07-20 NOTE — Addendum Note (Signed)
Addended by: Ronette Deter A on: 07/20/2015 06:45 PM   Modules accepted: Orders

## 2015-07-23 DIAGNOSIS — E1122 Type 2 diabetes mellitus with diabetic chronic kidney disease: Secondary | ICD-10-CM | POA: Diagnosis not present

## 2015-07-23 DIAGNOSIS — I252 Old myocardial infarction: Secondary | ICD-10-CM | POA: Diagnosis not present

## 2015-07-23 DIAGNOSIS — N183 Chronic kidney disease, stage 3 (moderate): Secondary | ICD-10-CM | POA: Diagnosis not present

## 2015-07-23 DIAGNOSIS — I2510 Atherosclerotic heart disease of native coronary artery without angina pectoris: Secondary | ICD-10-CM | POA: Diagnosis not present

## 2015-07-23 DIAGNOSIS — J449 Chronic obstructive pulmonary disease, unspecified: Secondary | ICD-10-CM | POA: Diagnosis not present

## 2015-07-23 DIAGNOSIS — G4733 Obstructive sleep apnea (adult) (pediatric): Secondary | ICD-10-CM | POA: Diagnosis not present

## 2015-07-23 DIAGNOSIS — Z7984 Long term (current) use of oral hypoglycemic drugs: Secondary | ICD-10-CM | POA: Diagnosis not present

## 2015-07-23 DIAGNOSIS — Z794 Long term (current) use of insulin: Secondary | ICD-10-CM | POA: Diagnosis not present

## 2015-07-23 DIAGNOSIS — K219 Gastro-esophageal reflux disease without esophagitis: Secondary | ICD-10-CM | POA: Diagnosis not present

## 2015-07-23 DIAGNOSIS — E039 Hypothyroidism, unspecified: Secondary | ICD-10-CM | POA: Diagnosis not present

## 2015-07-23 DIAGNOSIS — E871 Hypo-osmolality and hyponatremia: Secondary | ICD-10-CM | POA: Diagnosis not present

## 2015-07-23 DIAGNOSIS — E1151 Type 2 diabetes mellitus with diabetic peripheral angiopathy without gangrene: Secondary | ICD-10-CM | POA: Diagnosis not present

## 2015-07-23 DIAGNOSIS — E1165 Type 2 diabetes mellitus with hyperglycemia: Secondary | ICD-10-CM | POA: Diagnosis not present

## 2015-07-24 ENCOUNTER — Ambulatory Visit: Payer: Self-pay | Admitting: *Deleted

## 2015-07-24 ENCOUNTER — Other Ambulatory Visit: Payer: Self-pay | Admitting: *Deleted

## 2015-07-24 NOTE — Patient Outreach (Signed)
Attempt made to contact daughter [name] for pt's transition of care call (week 4, Skilled nursing home discharge 12/4).   HIPPA compliant voice message left with contact number.     If no response, will try at a later time.     [name]   [facility]  [phone]

## 2015-07-25 ENCOUNTER — Other Ambulatory Visit: Payer: Self-pay | Admitting: *Deleted

## 2015-07-25 DIAGNOSIS — I2510 Atherosclerotic heart disease of native coronary artery without angina pectoris: Secondary | ICD-10-CM | POA: Diagnosis not present

## 2015-07-25 DIAGNOSIS — E039 Hypothyroidism, unspecified: Secondary | ICD-10-CM | POA: Diagnosis not present

## 2015-07-25 DIAGNOSIS — E1165 Type 2 diabetes mellitus with hyperglycemia: Secondary | ICD-10-CM | POA: Diagnosis not present

## 2015-07-25 DIAGNOSIS — G4733 Obstructive sleep apnea (adult) (pediatric): Secondary | ICD-10-CM | POA: Diagnosis not present

## 2015-07-25 DIAGNOSIS — E1122 Type 2 diabetes mellitus with diabetic chronic kidney disease: Secondary | ICD-10-CM | POA: Diagnosis not present

## 2015-07-25 DIAGNOSIS — Z794 Long term (current) use of insulin: Secondary | ICD-10-CM | POA: Diagnosis not present

## 2015-07-25 DIAGNOSIS — E871 Hypo-osmolality and hyponatremia: Secondary | ICD-10-CM | POA: Diagnosis not present

## 2015-07-25 DIAGNOSIS — J449 Chronic obstructive pulmonary disease, unspecified: Secondary | ICD-10-CM | POA: Diagnosis not present

## 2015-07-25 DIAGNOSIS — Z7984 Long term (current) use of oral hypoglycemic drugs: Secondary | ICD-10-CM | POA: Diagnosis not present

## 2015-07-25 DIAGNOSIS — I252 Old myocardial infarction: Secondary | ICD-10-CM | POA: Diagnosis not present

## 2015-07-25 DIAGNOSIS — K219 Gastro-esophageal reflux disease without esophagitis: Secondary | ICD-10-CM | POA: Diagnosis not present

## 2015-07-25 DIAGNOSIS — E1151 Type 2 diabetes mellitus with diabetic peripheral angiopathy without gangrene: Secondary | ICD-10-CM | POA: Diagnosis not present

## 2015-07-25 DIAGNOSIS — N183 Chronic kidney disease, stage 3 (moderate): Secondary | ICD-10-CM | POA: Diagnosis not present

## 2015-07-25 NOTE — Patient Outreach (Signed)
Transition of care call (week 4, discharged from skilled nursing facility 12/4):  Spoke with daughter [name] who made attempt to return yesterday's voice message left by RN CM.  Daughter reports pt's sugars running high, 522 this am.  Daughter reports pt both took am insulin/ate 1.5 hours ago. Daughter reports MD was not called, to see MD 08/03/15.   RN CM requested daughter to have pt recheck sugar, result was 495.   Daughter reports pt has been eating a lot of carbohydrates since Monday, dinner last night high in carbohydrates.  Daughter reports sugars were running high last week 300-500, on 12/23 pt diagnosed with UTI and pt did not start medication until 12/26.   Daughter frustrated  as she is trying to help pt to eat healthy  but other members of household not helping- one brought donuts into the home.   Daughter would like pt to f/u with a diabetic MD.  RN CM discussed with daughter to call MD office today, report high sugar reading and request referral for diabetic MD to which daughter said she would do.    Plan to f/u again with pt telephonically 08/02/15- final transition of care call.   [name]   [facility]  [phone]

## 2015-07-27 ENCOUNTER — Telehealth: Payer: Self-pay | Admitting: *Deleted

## 2015-07-27 DIAGNOSIS — K219 Gastro-esophageal reflux disease without esophagitis: Secondary | ICD-10-CM | POA: Diagnosis not present

## 2015-07-27 DIAGNOSIS — E1151 Type 2 diabetes mellitus with diabetic peripheral angiopathy without gangrene: Secondary | ICD-10-CM | POA: Diagnosis not present

## 2015-07-27 DIAGNOSIS — E871 Hypo-osmolality and hyponatremia: Secondary | ICD-10-CM | POA: Diagnosis not present

## 2015-07-27 DIAGNOSIS — Z794 Long term (current) use of insulin: Secondary | ICD-10-CM | POA: Diagnosis not present

## 2015-07-27 DIAGNOSIS — E1165 Type 2 diabetes mellitus with hyperglycemia: Secondary | ICD-10-CM | POA: Diagnosis not present

## 2015-07-27 DIAGNOSIS — I252 Old myocardial infarction: Secondary | ICD-10-CM | POA: Diagnosis not present

## 2015-07-27 DIAGNOSIS — G4733 Obstructive sleep apnea (adult) (pediatric): Secondary | ICD-10-CM | POA: Diagnosis not present

## 2015-07-27 DIAGNOSIS — E039 Hypothyroidism, unspecified: Secondary | ICD-10-CM | POA: Diagnosis not present

## 2015-07-27 DIAGNOSIS — J449 Chronic obstructive pulmonary disease, unspecified: Secondary | ICD-10-CM | POA: Diagnosis not present

## 2015-07-27 DIAGNOSIS — Z7984 Long term (current) use of oral hypoglycemic drugs: Secondary | ICD-10-CM | POA: Diagnosis not present

## 2015-07-27 DIAGNOSIS — N183 Chronic kidney disease, stage 3 (moderate): Secondary | ICD-10-CM | POA: Diagnosis not present

## 2015-07-27 DIAGNOSIS — I2510 Atherosclerotic heart disease of native coronary artery without angina pectoris: Secondary | ICD-10-CM | POA: Diagnosis not present

## 2015-07-27 DIAGNOSIS — E1122 Type 2 diabetes mellitus with diabetic chronic kidney disease: Secondary | ICD-10-CM | POA: Diagnosis not present

## 2015-07-27 NOTE — Telephone Encounter (Signed)
That is fine 

## 2015-07-27 NOTE — Telephone Encounter (Signed)
Please advise 

## 2015-07-27 NOTE — Telephone Encounter (Signed)
[name] [name] from [facility] requested a double order for patient to have physical therapy in home. Patient could not be discharged to to lack of progress and blood sugar issues. Please Advise

## 2015-07-27 NOTE — Telephone Encounter (Signed)
[name] [name] is there a number to call Stacy back at?

## 2015-07-27 NOTE — Telephone Encounter (Signed)
Stacey's number is [phone]

## 2015-07-27 NOTE — Telephone Encounter (Signed)
Attempted to call Stacy back, unable to leave a voicemail.

## 2015-07-31 ENCOUNTER — Telehealth: Payer: Self-pay | Admitting: Internal Medicine

## 2015-07-31 NOTE — Telephone Encounter (Signed)
Spoke with [facility] at [facility], verbal order given.

## 2015-07-31 NOTE — Telephone Encounter (Signed)
See prior note from this patient for orders.

## 2015-07-31 NOTE — Telephone Encounter (Signed)
Jobe Gibbon called from [place] 336 [phone] regarding needing a verbal order for PT. Erline Levine did not say when or how long. It was on vm up front. I see she called on 07/27/2015. Thank You!

## 2015-08-01 DIAGNOSIS — Z7984 Long term (current) use of oral hypoglycemic drugs: Secondary | ICD-10-CM | POA: Diagnosis not present

## 2015-08-01 DIAGNOSIS — K219 Gastro-esophageal reflux disease without esophagitis: Secondary | ICD-10-CM | POA: Diagnosis not present

## 2015-08-01 DIAGNOSIS — E871 Hypo-osmolality and hyponatremia: Secondary | ICD-10-CM | POA: Diagnosis not present

## 2015-08-01 DIAGNOSIS — J449 Chronic obstructive pulmonary disease, unspecified: Secondary | ICD-10-CM | POA: Diagnosis not present

## 2015-08-01 DIAGNOSIS — G4733 Obstructive sleep apnea (adult) (pediatric): Secondary | ICD-10-CM | POA: Diagnosis not present

## 2015-08-01 DIAGNOSIS — Z794 Long term (current) use of insulin: Secondary | ICD-10-CM | POA: Diagnosis not present

## 2015-08-01 DIAGNOSIS — E1165 Type 2 diabetes mellitus with hyperglycemia: Secondary | ICD-10-CM | POA: Diagnosis not present

## 2015-08-01 DIAGNOSIS — N183 Chronic kidney disease, stage 3 (moderate): Secondary | ICD-10-CM | POA: Diagnosis not present

## 2015-08-01 DIAGNOSIS — I252 Old myocardial infarction: Secondary | ICD-10-CM | POA: Diagnosis not present

## 2015-08-01 DIAGNOSIS — E1151 Type 2 diabetes mellitus with diabetic peripheral angiopathy without gangrene: Secondary | ICD-10-CM | POA: Diagnosis not present

## 2015-08-01 DIAGNOSIS — R410 Disorientation, unspecified: Secondary | ICD-10-CM | POA: Diagnosis not present

## 2015-08-01 DIAGNOSIS — E1122 Type 2 diabetes mellitus with diabetic chronic kidney disease: Secondary | ICD-10-CM | POA: Diagnosis not present

## 2015-08-01 DIAGNOSIS — R6889 Other general symptoms and signs: Secondary | ICD-10-CM | POA: Diagnosis not present

## 2015-08-01 DIAGNOSIS — E118 Type 2 diabetes mellitus with unspecified complications: Secondary | ICD-10-CM | POA: Diagnosis not present

## 2015-08-01 DIAGNOSIS — I2510 Atherosclerotic heart disease of native coronary artery without angina pectoris: Secondary | ICD-10-CM | POA: Diagnosis not present

## 2015-08-01 DIAGNOSIS — E039 Hypothyroidism, unspecified: Secondary | ICD-10-CM | POA: Diagnosis not present

## 2015-08-01 DIAGNOSIS — R4182 Altered mental status, unspecified: Secondary | ICD-10-CM | POA: Diagnosis not present

## 2015-08-02 ENCOUNTER — Other Ambulatory Visit: Payer: Self-pay | Admitting: *Deleted

## 2015-08-02 NOTE — Patient Outreach (Signed)
Follow up phone call: Spoke with daughter [name] who reports pt's sugar this am (fasting) was 437.  Daughter reports pt had one slice of pizza last night and for breakfast today- oatmeal, one piece of toast, took her shot.  RN CM discussed with daughter calling MD to report elevated blood sugar to which daughter reports hard to contact MD office, will let MD know tomorrow at pt's scheduled visit.   As discussed with daughter, plan to do a home visit with pt on 1/11.      [name]   [facility]  [phone]

## 2015-08-02 NOTE — Patient Outreach (Signed)
Final transition of care call (Discharged from [facility] facility 12/6).  Spoke with daughter [name] (pt lives with daughter, on [facility] consent), reports pt's sugars have been high 400-500.  Daughter reports did not call MD, pt to see her tomorrow, will talk to her then about the high sugars.  Daughter reports pt has no change in her eating, trying to watch pt with her carbohydrates.   Daughter reports pt just got up to which RN CM requested pt check her sugar.  Daughter requested RN CM call back.      [name]   [facility]  [phone]

## 2015-08-03 ENCOUNTER — Ambulatory Visit (INDEPENDENT_AMBULATORY_CARE_PROVIDER_SITE_OTHER): Payer: Medicare Other | Admitting: Internal Medicine

## 2015-08-03 ENCOUNTER — Encounter: Payer: Self-pay | Admitting: Internal Medicine

## 2015-08-03 VITALS — BP 108/66 | HR 89 | Temp 97.9°F | Ht 66.0 in | Wt 154.1 lb

## 2015-08-03 DIAGNOSIS — E1129 Type 2 diabetes mellitus with other diabetic kidney complication: Secondary | ICD-10-CM

## 2015-08-03 DIAGNOSIS — E039 Hypothyroidism, unspecified: Secondary | ICD-10-CM

## 2015-08-03 MED ORDER — METFORMIN HCL 500 MG PO TABS: 500.0000 mg | ORAL_TABLET | Freq: Two times a day (BID) | ORAL | Status: DC

## 2015-08-03 MED ORDER — INSULIN NPH ISOPHANE & REGULAR (70-30) 100 UNIT/ML ~~LOC~~ SUSP: SUBCUTANEOUS | Status: DC

## 2015-08-03 MED ORDER — LEVOTHYROXINE SODIUM 88 MCG PO TABS: 88.0000 ug | ORAL_TABLET | Freq: Every day | ORAL | Status: DC

## 2015-08-03 NOTE — Assessment & Plan Note (Signed)
Recent blood sugars have been elevated. Will increase Humalin 70-30 to 17units with breakfast and 15units with dinner. Add Metformin 500mg  po bid. Discussed potential risks of this medication. Follow up 1 week.

## 2015-08-03 NOTE — Progress Notes (Signed)
Pre visit review using our clinic review tool, if applicable. No additional management support is needed unless otherwise documented below in the visit note. 

## 2015-08-03 NOTE — Patient Instructions (Addendum)
Start Metformin 500mg  twice daily.  Increase 70/30 Insulin to 17units in the morning and 15 units at supper.  Follow up in 1 week.

## 2015-08-03 NOTE — Assessment & Plan Note (Signed)
Thyroid function in 05/2015 normal. Continue Levothyroxine.

## 2015-08-03 NOTE — Progress Notes (Signed)
Subjective:    Patient ID: [name] [name], female    DOB: [birth year], 83 y.o.   MRN: [id number]  HPI  82YO female presents for follow up.  DM - BG have been high, mostly 400-500. Compliant with medication and eating healthy mostly. Notes increased urinary frequency. Recent UA and culture at [facility] was normal.  Wt Readings from Last 3 Encounters:  08/03/15 154 lb 2 oz (69.911 kg)  07/17/15 161 lb (73.029 kg)  07/05/15 156 lb 6 oz (70.931 kg)   BP Readings from Last 3 Encounters:  08/03/15 108/66  07/17/15 118/66  07/05/15 100/63    Past Medical History  Diagnosis Date  . Diabetes mellitus   . Cancer [facility])     2004 Right breast, found on mammogram, radiation therapy, Dr. Bryson Ha, uterine cancer,   . OSA (obstructive sleep apnea)   . Osteoporosis 02/21/09    DEXA scan showed osteoporosis with left femur T-score -2.8.  Marland Kitchen Thyroid disease     Hypothyroid  . Hyperlipidemia   . CAD (coronary artery disease)   . Anxiety state, unspecified   . Vitamin D deficiency   . Esophageal reflux   . Myocardial infarction [facility])     Cath negative except for 40% occlusion LAD.  Pt not candidate for betablocker or ACEI because of hypotension  . Hypotension   . Presbyacusis   . COPD (chronic obstructive pulmonary disease) ([place])   . Hearing loss   . IBS (irritable bowel syndrome)   . Heart burn    Family History  Problem Relation Age of Onset  . Diabetes Mother   . Heart attack Father    Past Surgical History  Procedure Laterality Date  . Abdominal hysterectomy      uterine cancer  . Breast surgery    . Colon surgery  2013    done at [place] History  . Marital Status: Single    Spouse Name: N/A  . Number of Children: N/A  . Years of Education: N/A   Social History Main Topics  . Smoking status: Never Smoker   . Smokeless tobacco: Never Used  . Alcohol Use: No  . Drug Use: No  . Sexual Activity: Not on file   Other Topics Concern  .  Not on file   Social History Narrative    Review of Systems  Constitutional: Negative for fever, chills, appetite change, fatigue and unexpected weight change.  Eyes: Negative for visual disturbance.  Respiratory: Negative for shortness of breath.   Cardiovascular: Negative for chest pain and leg swelling.  Gastrointestinal: Negative for abdominal pain.  Genitourinary: Positive for frequency. Negative for dysuria, urgency, flank pain, decreased urine volume and pelvic pain.  Skin: Negative for color change and rash.  Hematological: Negative for adenopathy. Does not bruise/bleed easily.  Psychiatric/Behavioral: Negative for dysphoric mood. The patient is not nervous/anxious.        Objective:    BP 108/66 mmHg  Pulse 89  Temp(Src) 97.9 F (36.6 C) (Oral)  Ht 5\' 6"  (1.676 m)  Wt 154 lb 2 oz (69.911 kg)  BMI 24.89 kg/m2  SpO2 94% Physical Exam  Constitutional: She is oriented to person, place, and time. She appears well-developed and well-nourished. No distress.  HENT:  Head: Normocephalic and atraumatic.  Right Ear: External ear normal.  Left Ear: External ear normal.  Nose: Nose normal.  Mouth/Throat: Oropharynx is clear and moist. No oropharyngeal exudate.  Eyes: Conjunctivae are normal. Pupils  are equal, round, and reactive to light. Right eye exhibits no discharge. Left eye exhibits no discharge. No scleral icterus.  Neck: Normal range of motion. Neck supple. No tracheal deviation present. No thyromegaly present.  Cardiovascular: Normal rate, regular rhythm, normal heart sounds and intact distal pulses.  Exam reveals no gallop and no friction rub.   No murmur heard. Pulmonary/Chest: Effort normal and breath sounds normal. No respiratory distress. [name] no wheezes. [name] no rales. She exhibits no tenderness.  Musculoskeletal: Normal range of motion. She exhibits no edema or tenderness.  Lymphadenopathy:    [name] no cervical adenopathy.  Neurological: She is alert  and oriented to person, place, and time. No cranial nerve deficit. She exhibits normal muscle tone. Coordination normal.  Skin: Skin is warm and dry. No rash noted. She is not diaphoretic. No erythema. No pallor.  Psychiatric: [name] a normal mood and affect. Her behavior is normal. Judgment and thought content normal.          Assessment & Plan:   Problem List Items Addressed This Visit      Unprioritized   Hypothyroidism    Thyroid function in 05/2015 normal. Continue Levothyroxine.      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 Will increase Humalin 70-30 to 17units with breakfast and 15units with dinner. Add Metformin 500mg  po bid. Discussed potential risks of this medication. Follow up 1 week.      Relevant Medications   metFORMIN (GLUCOPHAGE) 500 MG tablet   insulin NPH-regular Human (HUMULIN 70/30) (70-30) 100 UNIT/ML injection       Return in about 1 week (around 08/10/2015) for Recheck.

## 2015-08-06 DIAGNOSIS — E039 Hypothyroidism, unspecified: Secondary | ICD-10-CM | POA: Diagnosis not present

## 2015-08-06 DIAGNOSIS — E1165 Type 2 diabetes mellitus with hyperglycemia: Secondary | ICD-10-CM | POA: Diagnosis not present

## 2015-08-06 DIAGNOSIS — N183 Chronic kidney disease, stage 3 (moderate): Secondary | ICD-10-CM | POA: Diagnosis not present

## 2015-08-06 DIAGNOSIS — I252 Old myocardial infarction: Secondary | ICD-10-CM | POA: Diagnosis not present

## 2015-08-06 DIAGNOSIS — K219 Gastro-esophageal reflux disease without esophagitis: Secondary | ICD-10-CM | POA: Diagnosis not present

## 2015-08-06 DIAGNOSIS — I2510 Atherosclerotic heart disease of native coronary artery without angina pectoris: Secondary | ICD-10-CM | POA: Diagnosis not present

## 2015-08-06 DIAGNOSIS — E1122 Type 2 diabetes mellitus with diabetic chronic kidney disease: Secondary | ICD-10-CM | POA: Diagnosis not present

## 2015-08-06 DIAGNOSIS — Z7984 Long term (current) use of oral hypoglycemic drugs: Secondary | ICD-10-CM | POA: Diagnosis not present

## 2015-08-06 DIAGNOSIS — J449 Chronic obstructive pulmonary disease, unspecified: Secondary | ICD-10-CM | POA: Diagnosis not present

## 2015-08-06 DIAGNOSIS — E871 Hypo-osmolality and hyponatremia: Secondary | ICD-10-CM | POA: Diagnosis not present

## 2015-08-06 DIAGNOSIS — G4733 Obstructive sleep apnea (adult) (pediatric): Secondary | ICD-10-CM | POA: Diagnosis not present

## 2015-08-06 DIAGNOSIS — E1151 Type 2 diabetes mellitus with diabetic peripheral angiopathy without gangrene: Secondary | ICD-10-CM | POA: Diagnosis not present

## 2015-08-06 DIAGNOSIS — Z794 Long term (current) use of insulin: Secondary | ICD-10-CM | POA: Diagnosis not present

## 2015-08-08 ENCOUNTER — Other Ambulatory Visit: Payer: Self-pay | Admitting: *Deleted

## 2015-08-08 DIAGNOSIS — Z794 Long term (current) use of insulin: Secondary | ICD-10-CM | POA: Diagnosis not present

## 2015-08-08 DIAGNOSIS — I2510 Atherosclerotic heart disease of native coronary artery without angina pectoris: Secondary | ICD-10-CM | POA: Diagnosis not present

## 2015-08-08 DIAGNOSIS — E1122 Type 2 diabetes mellitus with diabetic chronic kidney disease: Secondary | ICD-10-CM | POA: Diagnosis not present

## 2015-08-08 DIAGNOSIS — E039 Hypothyroidism, unspecified: Secondary | ICD-10-CM | POA: Diagnosis not present

## 2015-08-08 DIAGNOSIS — I252 Old myocardial infarction: Secondary | ICD-10-CM | POA: Diagnosis not present

## 2015-08-08 DIAGNOSIS — G4733 Obstructive sleep apnea (adult) (pediatric): Secondary | ICD-10-CM | POA: Diagnosis not present

## 2015-08-08 DIAGNOSIS — Z7984 Long term (current) use of oral hypoglycemic drugs: Secondary | ICD-10-CM | POA: Diagnosis not present

## 2015-08-08 DIAGNOSIS — E1151 Type 2 diabetes mellitus with diabetic peripheral angiopathy without gangrene: Secondary | ICD-10-CM | POA: Diagnosis not present

## 2015-08-08 DIAGNOSIS — E1165 Type 2 diabetes mellitus with hyperglycemia: Secondary | ICD-10-CM | POA: Diagnosis not present

## 2015-08-08 DIAGNOSIS — J449 Chronic obstructive pulmonary disease, unspecified: Secondary | ICD-10-CM | POA: Diagnosis not present

## 2015-08-08 DIAGNOSIS — N183 Chronic kidney disease, stage 3 (moderate): Secondary | ICD-10-CM | POA: Diagnosis not present

## 2015-08-08 DIAGNOSIS — K219 Gastro-esophageal reflux disease without esophagitis: Secondary | ICD-10-CM | POA: Diagnosis not present

## 2015-08-08 DIAGNOSIS — E871 Hypo-osmolality and hyponatremia: Secondary | ICD-10-CM | POA: Diagnosis not present

## 2015-08-09 ENCOUNTER — Encounter: Payer: Self-pay | Admitting: *Deleted

## 2015-08-09 NOTE — Patient Outreach (Signed)
[facility]) Care Management   Home visit 08/08/15  [name] [name] Jul 25, 1932 [id number]  [name] [name] is an 82 y.o. female  Subjective:  Daughter reports pt is not waiting 30 minutes after taking her insulin to eat, sugar this am after  Shot was 398.  Daughter reports pt f/u with Dr. Gilford Rile, changed pt's insulin (Humulin 70/30) to 17 units am, 15 units pm plus added Metformin.   Daughter states pt is to f/u with MD again 1/13 plus will be seeing an endocrinologist 1/24.   Daughter reports [facility] came out to do an evaluation on pt but she will not be able to get  Into the program until April.  [place], [place] the city.   Daughter states pt's son is coming in from out of town, will be talking to him.  Pt reports she is doing better walking, HHPT coming.    Objective:   Filed Vitals:   08/08/15 1328  BP: 100/80  Pulse: 83  Resp: 20    ROS  Physical Exam  Constitutional: She is oriented to person, place, and time. She appears well-developed and well-nourished.  Cardiovascular: Normal rate and regular rhythm.   Respiratory: Effort normal and breath sounds normal.  GI: Soft.  Musculoskeletal: Normal range of motion.  Neurological: She is alert and oriented to person, place, and time.  Skin: Skin is warm and dry.  Psychiatric: She has a normal mood and affect. Her behavior is normal. Judgment and thought content normal.    Current Medications:  Reviewed with daughter (manages pt's medications) Current Outpatient Prescriptions  Medication Sig Dispense Refill  . fluticasone (FLONASE) 50 MCG/ACT nasal spray Place into both nostrils daily.    . insulin NPH-regular Human (HUMULIN 70/30) (70-30) 100 UNIT/ML injection 17 units with breakfast and 15 units with supper. 10 mL 11  . levothyroxine (SYNTHROID, LEVOTHROID) 88 MCG tablet Take 1 tablet (88 mcg total) by mouth daily. 90 tablet 3  . metFORMIN  (GLUCOPHAGE) 500 MG tablet Take 1 tablet (500 mg total) by mouth 2 (two) times daily with a meal. 180 tablet 3  . pantoprazole (PROTONIX) 40 MG tablet Take 40 mg by mouth 2 (two) times daily.    . diphenhydrAMINE (BENADRYL) 25 MG tablet Take 1 tablet (25 mg total) by mouth every 8 (eight) hours as needed. (Patient not taking: Reported on 08/08/2015) 30 tablet 0   No current facility-administered medications for this visit.    Functional Status:   In your present state of health, do you have any difficulty performing the following activities: 06/08/2015 11/06/2014  Hearing? Tempie Donning  Vision? N N  Difficulty concentrating or making decisions? N Y  Walking or climbing stairs? N N  Dressing or bathing? N N  Doing errands, shopping? Y N  Preparing Food and eating ? - N  Using the Toilet? - N  In the past six months, have you accidently leaked urine? - N  Do you have problems with loss of bowel control? - N  Managing your Medications? - N  Housekeeping or managing your Housekeeping? - N    Fall/Depression Screening:    PHQ 2/9 Scores 08/15/2014 07/26/2012 06/15/2012  PHQ - 2 Score 0 0 3    Assessment:  DM- view of pt's sugar recordings =this am 398 (after insulin), recheck 2.5 hours later - 485 (pt had 2 pancakes, sugar free syrup,2 bacon  for breakfast).  Last night 146.  Ranges 57-550.     Plan:  Pt to continue to monitor sugars, record, bring to upcoming MD visits.             Pt to f/u with Dr. Gilford Rile 1/13.            Pt to f/u with Endocrinologist 1/24.             RN CM to continue to provide pt with community nurse case management services, next home visit  2/10.  THN CM Care Plan Problem One        Most Recent Value   Care Plan Problem One  Elevated blood sugars    Role Documenting the Problem One  Care Management Coordinator   Care Plan for Problem One  Active   THN Long Term Goal (31-90 days)  Pt's blood sugars would be within norm in the next 60 days    THN Long Term Goal Start  Date  08/02/15   Interventions for Problem One Long Term Goal  Reviewed with daughter monitoring pt's intake of carbohydrates, sweets, calling MD when sugars continue to be elevated    THN CM Short Term Goal #1 (0-30 days)  Daughter will call MD office if sugars remain high in the next 30 days    THN CM Short Term Goal #1 Start Date  08/02/15   Interventions for Short Term Goal #1  Discussed with daughter importance of calling MD to report high blood sugars, MD can adjust insulin.     THN CM Short Term Goal #2 (0-30 days)  Pt would take insulin correctly in the next 30 days    THN CM Short Term Goal #2 Start Date  08/08/15   Interventions for Short Term Goal #2  Discussed with pt taking her insulin 30 minutes before a meal for effectiveness      [name]   [place] [facility]  [phone]

## 2015-08-10 ENCOUNTER — Encounter: Payer: Self-pay | Admitting: Internal Medicine

## 2015-08-10 ENCOUNTER — Ambulatory Visit (INDEPENDENT_AMBULATORY_CARE_PROVIDER_SITE_OTHER): Payer: Medicare Other | Admitting: Internal Medicine

## 2015-08-10 VITALS — BP 109/66 | HR 87 | Temp 98.1°F

## 2015-08-10 DIAGNOSIS — E1129 Type 2 diabetes mellitus with other diabetic kidney complication: Secondary | ICD-10-CM

## 2015-08-10 MED ORDER — INSULIN NPH ISOPHANE & REGULAR (70-30) 100 UNIT/ML ~~LOC~~ SUSP: SUBCUTANEOUS | Status: DC

## 2015-08-10 NOTE — Progress Notes (Signed)
Pre visit review using our clinic review tool, if applicable. No additional management support is needed unless otherwise documented below in the visit note. 

## 2015-08-10 NOTE — Assessment & Plan Note (Signed)
BG continue to be elevated. Over the last 5 years, her blood sugars have been very difficult to control. She has been on long acting insulin, NPH, regular insulin and now 70-30. We have also tried Metformin, Glipizide, Trajenta. Her BG have varied widely from 20s-over 500. Suspect compliance with medication and diet partially responsible. We have set up home health and nursing instruction with no improvement. She was previously followed by Dr. Howell Rucks and will now see [name] at [facility]. Will increase Humalin 70-30 to 20units with breakfast and 15units with dinner. Continue Metformin. Follow up in 4 weeks and prn.

## 2015-08-10 NOTE — Patient Instructions (Signed)
Increase Humalin 70-30 to 20 units with breakfast and continue 15units with supper.  Follow up with [name] as scheduled.

## 2015-08-10 NOTE — Progress Notes (Signed)
Subjective:    Patient ID: [name] [name], female    DOB: Dec 21, 1931, 82 y.o.   MRN: [id number]  HPI  82YO female presents for follow up.  Last visit 1/6, added Metformin and increased Humalin 70-30 17units with breakfast and 15units with supper.  She brings a record of BG today, which range from 140-over 500. No recent low BG. She reports compliance with insulin. She continues to live with her daughter who [name] been making healthy meals for her. She is frustrated by poor BG control. [name] tried several medications with no improvement. She continues to have urinary frequency with high BG.  Wt Readings from Last 3 Encounters:  08/08/15 154 lb (69.854 kg)  08/03/15 154 lb 2 oz (69.911 kg)  07/17/15 161 lb (73.029 kg)   BP Readings from Last 3 Encounters:  08/10/15 109/66  08/08/15 100/80  08/03/15 108/66    Past Medical History  Diagnosis Date  . Diabetes mellitus   . Cancer [facility])     2004 Right breast, found on mammogram, radiation therapy, Dr. Bryson Ha, uterine cancer,   . OSA (obstructive sleep apnea)   . Osteoporosis 02/21/09    DEXA scan showed osteoporosis with left femur T-score -2.8.  Marland Kitchen Thyroid disease     Hypothyroid  . Hyperlipidemia   . CAD (coronary artery disease)   . Anxiety state, unspecified   . Vitamin D deficiency   . Esophageal reflux   . Myocardial infarction [facility])     Cath negative except for 40% occlusion LAD.  Pt not candidate for betablocker or ACEI because of hypotension  . Hypotension   . Presbyacusis   . COPD (chronic obstructive pulmonary disease) ([place])   . Hearing loss   . IBS (irritable bowel syndrome)   . Heart burn    Family History  Problem Relation Age of Onset  . Diabetes Mother   . Heart attack Father    Past Surgical History  Procedure Laterality Date  . Abdominal hysterectomy      uterine cancer  . Breast surgery    . Colon surgery  2013    done at [place] History  . Marital Status: Single      Spouse Name: N/A  . Number of Children: N/A  . Years of Education: N/A   Social History Main Topics  . Smoking status: Never Smoker   . Smokeless tobacco: Never Used  . Alcohol Use: No  . Drug Use: No  . Sexual Activity: Not Asked   Other Topics Concern  . None   Social History Narrative    Review of Systems  Constitutional: Negative for fever, chills, appetite change, fatigue and unexpected weight change.  Eyes: Negative for visual disturbance.  Respiratory: Negative for shortness of breath.   Cardiovascular: Negative for chest pain, palpitations and leg swelling.  Gastrointestinal: Negative for abdominal pain, diarrhea and constipation.  Genitourinary: Positive for frequency. Negative for dysuria, urgency, flank pain and pelvic pain.  Musculoskeletal: Negative for myalgias and arthralgias.  Skin: Negative for color change and rash.  Hematological: Negative for adenopathy. Does not bruise/bleed easily.  Psychiatric/Behavioral: Negative for sleep disturbance and dysphoric mood. The patient is not nervous/anxious.        Objective:    BP 109/66 mmHg  Pulse 87  Temp(Src) 98.1 F (36.7 C) (Oral)  SpO2 94% Physical Exam  Constitutional: She is oriented to person, place, and time. She appears well-developed and well-nourished. No distress.  HENT:  Head: Normocephalic and atraumatic.  Right Ear: External ear normal.  Left Ear: External ear normal.  Nose: Nose normal.  Mouth/Throat: Oropharynx is clear and moist. No oropharyngeal exudate.  Eyes: Conjunctivae are normal. Pupils are equal, round, and reactive to light. Right eye exhibits no discharge. Left eye exhibits no discharge. No scleral icterus.  Neck: Normal range of motion. Neck supple. No tracheal deviation present. No thyromegaly present.  Cardiovascular: Normal rate, regular rhythm, normal heart sounds and intact distal pulses.  Exam reveals no gallop and no friction rub.   No murmur heard. Pulmonary/Chest:  Effort normal and breath sounds normal. No respiratory distress. [name] no wheezes. [name] no rales. She exhibits no tenderness.  Musculoskeletal: Normal range of motion. She exhibits no edema or tenderness.  Lymphadenopathy:    [name] no cervical adenopathy.  Neurological: She is alert and oriented to person, place, and time. No cranial nerve deficit. She exhibits normal muscle tone. Coordination normal.  Skin: Skin is warm and dry. No rash noted. She is not diaphoretic. No erythema. No pallor.  Psychiatric: [name] a normal mood and affect. Her behavior is normal. Judgment and thought content normal.          Assessment & Plan:   Problem List Items Addressed This Visit      Unprioritized   Type 2 diabetes mellitus with renal manifestations not at goal [facility]) - Primary    BG continue to be elevated. Over the last 5 years, her blood sugars have been very difficult to control. [name] been on long acting insulin, NPH, regular insulin and now 70-30. We have also tried Metformin, Glipizide, Trajenta. Her BG have varied widely from 20s-over 500. Suspect compliance with medication and diet partially responsible. We have set up home health and nursing instruction with no improvement. She was previously followed by Dr. Howell Rucks and will now see [name] at [facility]. Will increase Humalin 70-30 to 20units with breakfast and 15units with dinner. Continue Metformin. Follow up in 4 weeks and prn.      Relevant Medications   insulin NPH-regular Human (HUMULIN 70/30) (70-30) 100 UNIT/ML injection       Return in about 4 weeks (around 09/07/2015) for Recheck.

## 2015-08-13 DIAGNOSIS — I252 Old myocardial infarction: Secondary | ICD-10-CM | POA: Diagnosis not present

## 2015-08-13 DIAGNOSIS — K219 Gastro-esophageal reflux disease without esophagitis: Secondary | ICD-10-CM | POA: Diagnosis not present

## 2015-08-13 DIAGNOSIS — J449 Chronic obstructive pulmonary disease, unspecified: Secondary | ICD-10-CM | POA: Diagnosis not present

## 2015-08-13 DIAGNOSIS — Z794 Long term (current) use of insulin: Secondary | ICD-10-CM | POA: Diagnosis not present

## 2015-08-13 DIAGNOSIS — I2510 Atherosclerotic heart disease of native coronary artery without angina pectoris: Secondary | ICD-10-CM | POA: Diagnosis not present

## 2015-08-13 DIAGNOSIS — N183 Chronic kidney disease, stage 3 (moderate): Secondary | ICD-10-CM | POA: Diagnosis not present

## 2015-08-13 DIAGNOSIS — E039 Hypothyroidism, unspecified: Secondary | ICD-10-CM | POA: Diagnosis not present

## 2015-08-13 DIAGNOSIS — G4733 Obstructive sleep apnea (adult) (pediatric): Secondary | ICD-10-CM | POA: Diagnosis not present

## 2015-08-13 DIAGNOSIS — E1122 Type 2 diabetes mellitus with diabetic chronic kidney disease: Secondary | ICD-10-CM | POA: Diagnosis not present

## 2015-08-13 DIAGNOSIS — E871 Hypo-osmolality and hyponatremia: Secondary | ICD-10-CM | POA: Diagnosis not present

## 2015-08-13 DIAGNOSIS — E1151 Type 2 diabetes mellitus with diabetic peripheral angiopathy without gangrene: Secondary | ICD-10-CM | POA: Diagnosis not present

## 2015-08-13 DIAGNOSIS — Z7984 Long term (current) use of oral hypoglycemic drugs: Secondary | ICD-10-CM | POA: Diagnosis not present

## 2015-08-13 DIAGNOSIS — E1165 Type 2 diabetes mellitus with hyperglycemia: Secondary | ICD-10-CM | POA: Diagnosis not present

## 2015-08-14 DIAGNOSIS — E1151 Type 2 diabetes mellitus with diabetic peripheral angiopathy without gangrene: Secondary | ICD-10-CM | POA: Diagnosis not present

## 2015-08-14 DIAGNOSIS — K219 Gastro-esophageal reflux disease without esophagitis: Secondary | ICD-10-CM | POA: Diagnosis not present

## 2015-08-14 DIAGNOSIS — I2510 Atherosclerotic heart disease of native coronary artery without angina pectoris: Secondary | ICD-10-CM | POA: Diagnosis not present

## 2015-08-14 DIAGNOSIS — Z794 Long term (current) use of insulin: Secondary | ICD-10-CM | POA: Diagnosis not present

## 2015-08-14 DIAGNOSIS — N183 Chronic kidney disease, stage 3 (moderate): Secondary | ICD-10-CM | POA: Diagnosis not present

## 2015-08-14 DIAGNOSIS — E039 Hypothyroidism, unspecified: Secondary | ICD-10-CM | POA: Diagnosis not present

## 2015-08-14 DIAGNOSIS — E1165 Type 2 diabetes mellitus with hyperglycemia: Secondary | ICD-10-CM | POA: Diagnosis not present

## 2015-08-14 DIAGNOSIS — I252 Old myocardial infarction: Secondary | ICD-10-CM | POA: Diagnosis not present

## 2015-08-14 DIAGNOSIS — E871 Hypo-osmolality and hyponatremia: Secondary | ICD-10-CM | POA: Diagnosis not present

## 2015-08-14 DIAGNOSIS — J449 Chronic obstructive pulmonary disease, unspecified: Secondary | ICD-10-CM | POA: Diagnosis not present

## 2015-08-14 DIAGNOSIS — Z7984 Long term (current) use of oral hypoglycemic drugs: Secondary | ICD-10-CM | POA: Diagnosis not present

## 2015-08-14 DIAGNOSIS — G4733 Obstructive sleep apnea (adult) (pediatric): Secondary | ICD-10-CM | POA: Diagnosis not present

## 2015-08-14 DIAGNOSIS — E1122 Type 2 diabetes mellitus with diabetic chronic kidney disease: Secondary | ICD-10-CM | POA: Diagnosis not present

## 2015-08-15 DIAGNOSIS — J449 Chronic obstructive pulmonary disease, unspecified: Secondary | ICD-10-CM | POA: Diagnosis not present

## 2015-08-15 DIAGNOSIS — Z794 Long term (current) use of insulin: Secondary | ICD-10-CM | POA: Diagnosis not present

## 2015-08-15 DIAGNOSIS — E1165 Type 2 diabetes mellitus with hyperglycemia: Secondary | ICD-10-CM | POA: Diagnosis not present

## 2015-08-15 DIAGNOSIS — K219 Gastro-esophageal reflux disease without esophagitis: Secondary | ICD-10-CM | POA: Diagnosis not present

## 2015-08-15 DIAGNOSIS — E1122 Type 2 diabetes mellitus with diabetic chronic kidney disease: Secondary | ICD-10-CM | POA: Diagnosis not present

## 2015-08-15 DIAGNOSIS — N183 Chronic kidney disease, stage 3 (moderate): Secondary | ICD-10-CM | POA: Diagnosis not present

## 2015-08-15 DIAGNOSIS — E1151 Type 2 diabetes mellitus with diabetic peripheral angiopathy without gangrene: Secondary | ICD-10-CM | POA: Diagnosis not present

## 2015-08-15 DIAGNOSIS — E039 Hypothyroidism, unspecified: Secondary | ICD-10-CM | POA: Diagnosis not present

## 2015-08-15 DIAGNOSIS — I2510 Atherosclerotic heart disease of native coronary artery without angina pectoris: Secondary | ICD-10-CM | POA: Diagnosis not present

## 2015-08-15 DIAGNOSIS — Z7984 Long term (current) use of oral hypoglycemic drugs: Secondary | ICD-10-CM | POA: Diagnosis not present

## 2015-08-15 DIAGNOSIS — E871 Hypo-osmolality and hyponatremia: Secondary | ICD-10-CM | POA: Diagnosis not present

## 2015-08-15 DIAGNOSIS — I252 Old myocardial infarction: Secondary | ICD-10-CM | POA: Diagnosis not present

## 2015-08-15 DIAGNOSIS — G4733 Obstructive sleep apnea (adult) (pediatric): Secondary | ICD-10-CM | POA: Diagnosis not present

## 2015-08-16 NOTE — Telephone Encounter (Signed)
Not sure if this was addressed, but patient has been seen 4 times since receiving this message

## 2015-08-21 DIAGNOSIS — E1165 Type 2 diabetes mellitus with hyperglycemia: Secondary | ICD-10-CM | POA: Diagnosis not present

## 2015-08-21 DIAGNOSIS — Z794 Long term (current) use of insulin: Secondary | ICD-10-CM | POA: Diagnosis not present

## 2015-09-04 ENCOUNTER — Telehealth: Payer: Self-pay | Admitting: Internal Medicine

## 2015-09-04 NOTE — Telephone Encounter (Signed)
LMOMTCB

## 2015-09-04 NOTE — Telephone Encounter (Signed)
Called pt to ask how often she was checking her sugar and if she wanted to use [facility] to get her diabetic supplies.

## 2015-09-05 ENCOUNTER — Telehealth: Payer: Self-pay

## 2015-09-05 NOTE — Telephone Encounter (Signed)
Pt states that does not want to use [facility] for diabetic supplies, she is fine using [facility]

## 2015-09-07 ENCOUNTER — Ambulatory Visit: Payer: Self-pay | Admitting: *Deleted

## 2015-09-07 ENCOUNTER — Other Ambulatory Visit: Payer: Self-pay | Admitting: *Deleted

## 2015-09-07 ENCOUNTER — Ambulatory Visit: Payer: Medicare Other | Admitting: Internal Medicine

## 2015-09-07 NOTE — Patient Outreach (Signed)
Attempt made to f/u with pt's granddaughter [name] (on [facility] consent form) as was informed by daughter [name] pt  now staying with her.  HIPPA compliant voice message left with contact number.  If no response, will try again.    [name]   [place] [facility]  [phone]

## 2015-09-07 NOTE — Patient Outreach (Addendum)
Arrived at daughter [name]'s home where pt has been living.  Daughter reports pt  moved in with granddaughter [name] 1/23, she and pt had issues.   Daughter reports pt f/u with endocrinologist 1/24, insulin changed (do not know the name),pt  taking 4 shots a day.   Daughter reports pt was suppose to f/u with MD again 2/7 but pt went to [place], asked daughter to cancel all of her appointments, appointment rescheduled for 3/7.  Daughter reports pt is not compliant with diabetic diet.  Daughter reports son from out of town has a temporary place for pt to stay where she will have a nurse but was not told where it is.   Daughter provided RN CM with address/contact number for pt's granddaughter.     [name]   [place] [facility]  [phone]

## 2015-09-11 ENCOUNTER — Other Ambulatory Visit: Payer: Self-pay | Admitting: *Deleted

## 2015-09-11 NOTE — Patient Outreach (Addendum)
Second attempt made to contact pt, was informed by daughter [name] [name] on 2/10 - pt now staying with granddaughter [name] [name] (on [facility] consent form),granddaughter's number provided.    HIPPA compliant voice message left with contact number.  If no response, will try again.    Addendum- granddaughter's address:  [place], [place]   [place] [facility]  [phone]

## 2015-09-12 ENCOUNTER — Encounter: Payer: Self-pay | Admitting: Internal Medicine

## 2015-09-12 ENCOUNTER — Ambulatory Visit (INDEPENDENT_AMBULATORY_CARE_PROVIDER_SITE_OTHER): Payer: Medicare Other | Admitting: Internal Medicine

## 2015-09-12 VITALS — BP 104/66 | HR 82 | Temp 97.5°F | Ht 65.0 in | Wt 161.0 lb

## 2015-09-12 DIAGNOSIS — E1151 Type 2 diabetes mellitus with diabetic peripheral angiopathy without gangrene: Secondary | ICD-10-CM | POA: Diagnosis not present

## 2015-09-12 DIAGNOSIS — E1165 Type 2 diabetes mellitus with hyperglycemia: Secondary | ICD-10-CM

## 2015-09-12 DIAGNOSIS — IMO0002 Reserved for concepts with insufficient information to code with codable children: Secondary | ICD-10-CM

## 2015-09-12 NOTE — Progress Notes (Signed)
Subjective:    Patient ID: [name] [name], female    DOB: Dec 13, 1931, 82 y.o.   MRN: [id number]  HPI  82YO female presents for follow up.  DM- Doing very well. One low blood sugar of 59. One BG over 350. Daughter giving Lantus injections. Back living on her own, but daughter-in-law making sure she gets appropriate medication and food. Counting carbohydrates. Giving both Lantus insulin and Humalog with meals.  Wt Readings from Last 3 Encounters:  09/12/15 161 lb (73.029 kg)  08/08/15 154 lb (69.854 kg)  08/03/15 154 lb 2 oz (69.911 kg)   BP Readings from Last 3 Encounters:  09/12/15 104/66  08/10/15 109/66  08/08/15 100/80    Past Medical History  Diagnosis Date  . Diabetes mellitus   . Cancer [facility])     2004 Right breast, found on mammogram, radiation therapy, Dr. Bryson Ha, uterine cancer,   . OSA (obstructive sleep apnea)   . Osteoporosis 02/21/09    DEXA scan showed osteoporosis with left femur T-score -2.8.  Marland Kitchen Thyroid disease     Hypothyroid  . Hyperlipidemia   . CAD (coronary artery disease)   . Anxiety state, unspecified   . Vitamin D deficiency   . Esophageal reflux   . Myocardial infarction [facility])     Cath negative except for 40% occlusion LAD.  Pt not candidate for betablocker or ACEI because of hypotension  . Hypotension   . Presbyacusis   . COPD (chronic obstructive pulmonary disease) ([place])   . Hearing loss   . IBS (irritable bowel syndrome)   . Heart burn    Family History  Problem Relation Age of Onset  . Diabetes Mother   . Heart attack Father    Past Surgical History  Procedure Laterality Date  . Abdominal hysterectomy      uterine cancer  . Breast surgery    . Colon surgery  2013    done at [place] History  . Marital Status: Single    Spouse Name: N/A  . Number of Children: N/A  . Years of Education: N/A   Social History Main Topics  . Smoking status: Never Smoker   . Smokeless tobacco: Never Used  . Alcohol  Use: No  . Drug Use: No  . Sexual Activity: Not Asked   Other Topics Concern  . None   Social History Narrative    Review of Systems  Constitutional: Negative for fever, chills, appetite change, fatigue and unexpected weight change.  Eyes: Negative for visual disturbance.  Respiratory: Negative for cough and shortness of breath.   Cardiovascular: Negative for chest pain and leg swelling.  Gastrointestinal: Negative for nausea, vomiting, abdominal pain, diarrhea and constipation.  Musculoskeletal: Positive for gait problem.  Skin: Negative for color change and rash.  Hematological: Negative for adenopathy. Does not bruise/bleed easily.  Psychiatric/Behavioral: Negative for sleep disturbance and dysphoric mood. The patient is not nervous/anxious.        Objective:    BP 104/66 mmHg  Pulse 82  Temp(Src) 97.5 F (36.4 C) (Oral)  Ht 5\' 5"  (1.651 m)  Wt 161 lb (73.029 kg)  BMI 26.79 kg/m2  SpO2 96% Physical Exam  Constitutional: She is oriented to person, place, and time. She appears well-developed and well-nourished. No distress.  HENT:  Head: Normocephalic and atraumatic.  Right Ear: External ear normal.  Left Ear: External ear normal.  Nose: Nose normal.  Mouth/Throat: Oropharynx is clear and moist. No oropharyngeal  exudate.  Eyes: Conjunctivae are normal. Pupils are equal, round, and reactive to light. Right eye exhibits no discharge. Left eye exhibits no discharge. No scleral icterus.  Neck: Normal range of motion. Neck supple. No tracheal deviation present. No thyromegaly present.  Cardiovascular: Normal rate, regular rhythm, normal heart sounds and intact distal pulses.  Exam reveals no gallop and no friction rub.   No murmur heard. Pulmonary/Chest: Effort normal and breath sounds normal. No respiratory distress. [name] no wheezes. [name] no rales. She exhibits no tenderness.  Musculoskeletal: Normal range of motion. She exhibits no edema or tenderness.    Lymphadenopathy:    [name] no cervical adenopathy.  Neurological: She is alert and oriented to person, place, and time. No cranial nerve deficit. She exhibits normal muscle tone. Gait (shuffling) abnormal. Coordination normal.  Skin: Skin is warm and dry. No rash noted. She is not diaphoretic. No erythema. No pallor.  Psychiatric: [name] a normal mood and affect. Her behavior is normal. Judgment and thought content normal.          Assessment & Plan:  Over 49min of which >50% spent in face-to-face contact with patient and daughter-in-law discussing plan of care  Problem List Items Addressed This Visit      Unprioritized   DM (diabetes mellitus) type II uncontrolled, periph vascular disorder ([place]) - Primary    BG much improved. Reviewed notes from [facility] Endocrinology. Will check A1c with labs. Continue current medication. Follow up in 3 months.      Relevant Medications   insulin lispro (HUMALOG) 100 UNIT/ML KiwkPen   insulin glargine (LANTUS) 100 UNIT/ML injection   Other Relevant Orders   Comprehensive metabolic panel   Hemoglobin A1c       Return in about 3 months (around 12/10/2015) for Recheck.

## 2015-09-12 NOTE — Assessment & Plan Note (Signed)
BG much improved. Reviewed notes from [facility] Endocrinology. Will check A1c with labs. Continue current medication. Follow up in 3 months.

## 2015-09-12 NOTE — Progress Notes (Signed)
Pre visit review using our clinic review tool, if applicable. No additional management support is needed unless otherwise documented below in the visit note. 

## 2015-09-12 NOTE — Patient Instructions (Signed)
Continue current medication.  Follow-up in 3 months

## 2015-09-13 ENCOUNTER — Other Ambulatory Visit: Payer: Self-pay | Admitting: *Deleted

## 2015-09-13 LAB — COMPREHENSIVE METABOLIC PANEL
ALK PHOS: 110 U/L (ref 39–117)
ALT: 23 U/L (ref 0–35)
AST: 23 U/L (ref 0–37)
Albumin: 3.9 g/dL (ref 3.5–5.2)
BUN: 26 mg/dL — AB (ref 6–23)
CO2: 28 mEq/L (ref 19–32)
Calcium: 9.6 mg/dL (ref 8.4–10.5)
Chloride: 97 mEq/L (ref 96–112)
Creatinine, Ser: 1.18 mg/dL (ref 0.40–1.20)
GFR: 46.67 mL/min — ABNORMAL LOW (ref >60.00)
GLUCOSE: 447 mg/dL — AB (ref 70–99)
POTASSIUM: 5.1 meq/L (ref 3.5–5.1)
SODIUM: 132 meq/L — AB (ref 135–145)
TOTAL PROTEIN: 7.5 g/dL (ref 6.0–8.3)
Total Bilirubin: 0.5 mg/dL (ref 0.2–1.2)

## 2015-09-13 LAB — HEMOGLOBIN A1C: Hgb A1c MFr Bld: 9.8 % — ABNORMAL HIGH (ref 4.6–6.5)

## 2015-09-13 NOTE — Patient Outreach (Signed)
Received a call from pt's granddaughter [name] [name], reports moved pt into an apartment in [place], son Timmy living with her.  [name] [name] reports with the move, just found RN CM's number. [name] [name] reports pt is doing good, she and her Dad are taking turns staying with pt during the day, at night stays by herself.  [name] [name] reports she is going to get a lady to  come during the day to give her and her dad a break.   [name] [name] reports she has pt on a schedule with her insulin (insulin changed),A1C is down to 9.8    Spoke with pt who reports f/u with diabetic MD, was informed by Dr. Gilford Rile today A1C down to 9.8    As discussed with pt, plan to do a home visit 2/24.     [name]   [place] [facility]  [phone]

## 2015-09-13 NOTE — Patient Outreach (Signed)
Third attempt made to contact pt as view in Epic for 2/15 MD note- pt now living on her own- new address [address] [place]. 79F [name] [name] .   Phone number listed in [place] called (granddaughter [name] on voice message).  HIPPA compliant voice message left with contact number.    Plan to f/u with daughter [name] also.     [name]   [place] [facility]  [phone]

## 2015-09-21 ENCOUNTER — Other Ambulatory Visit: Payer: Self-pay | Admitting: *Deleted

## 2015-09-21 VITALS — BP 96/52 | HR 90 | Resp 24 | Ht 66.0 in | Wt 156.0 lb

## 2015-09-21 DIAGNOSIS — E1065 Type 1 diabetes mellitus with hyperglycemia: Principal | ICD-10-CM

## 2015-09-21 DIAGNOSIS — IMO0002 Reserved for concepts with insufficient information to code with codable children: Secondary | ICD-10-CM

## 2015-09-21 DIAGNOSIS — E1051 Type 1 diabetes mellitus with diabetic peripheral angiopathy without gangrene: Secondary | ICD-10-CM

## 2015-09-21 NOTE — Patient Outreach (Signed)
[facility] ([facility]) Care Management   09/21/2015  [name] [name] 01/01/1932 [id number]  [name] [name] is an 83 y.o. female  Subjective:  Granddaughter reports pt f/u with Dr. Abisogun, now taking 4 insulin shots a day, MD pleased pt's A1C down from 11 to 9.8 , to f/u again with MD 3/8.   Granddaughter reports she moved pt  with her son in an apartment, both she and her Dad take turns checking on her during the day. Granddaughter reports pt's sugar dropped to 50 last night, did not eat her snack.    Granddaughter reports an in home CNA (trial basis) will start 2/27- do 5 hrs./5 days a week with pt as son is at the [employer] [facility] during the day.   Objective:   Filed Vitals:   09/21/15 1320  BP: 96/52  Pulse: 90  Resp: 24    ROS  Physical Exam  Constitutional: She is oriented to person, place, and time. She appears well-developed and well-nourished.  Cardiovascular: Normal rate and regular rhythm.   Respiratory: Effort normal and breath sounds normal.  GI: Soft. Bowel sounds are normal.  Musculoskeletal: Normal range of motion.  Neurological: She is alert and oriented to person, place, and time.  Skin: Skin is warm and dry.  Psychiatric: She has a normal mood and affect. Her behavior is normal. Judgment and thought content normal.    Current Medications:  Reviewed with granddaughter (manages pt's medications) Current Outpatient Prescriptions  Medication Sig Dispense Refill  . fluticasone (FLONASE) 50 MCG/ACT nasal spray Place into both nostrils daily.    . insulin glargine (LANTUS) 100 UNIT/ML injection Inject 24 Units into the skin daily.     . insulin lispro (HUMALOG) 100 UNIT/ML KiwkPen Inject 8 Units into the skin 3 (three) times daily.     . levothyroxine (SYNTHROID, LEVOTHROID) 88 MCG tablet Take 1 tablet (88 mcg total) by mouth daily. 90 tablet 3  . metFORMIN (GLUCOPHAGE) 500 MG tablet Take 1 tablet (500 mg total) by mouth 2 (two) times daily with a meal. 180  tablet 3  . pantoprazole (PROTONIX) 40 MG tablet Take 40 mg by mouth 2 (two) times daily.     No current facility-administered medications for this visit.    Functional Status:   In your present state of health, do you have any difficulty performing the following activities: 06/08/2015 11/06/2014  Hearing? Y Y  Vision? N N  Difficulty concentrating or making decisions? N Y  Walking or climbing stairs? N N  Dressing or bathing? N N  Doing errands, shopping? Y N  Preparing Food and eating ? - N  Using the Toilet? - N  In the past six months, have you accidently leaked urine? - N  Do you have problems with loss of bowel control? - N  Managing your Medications? - N  Housekeeping or managing your Housekeeping? - N    Fall/Depression Screening:    PHQ 2/9 Scores 08/15/2014 07/26/2012 06/15/2012  PHQ - 2 Score 0 0 3    Assessment:  DM- view of pt's glucometer readings= today sugar 132, am ranges 62-377, pm 46-518/Hi reading.  Several lows after lunch.   Provided pt with new [facility] calendar to record sugars/bring results to MD visit.                          Advanced directives- pt not sure if have Living Will, papers provided to granddaughter to review for                               Pt.   Plan:  Pt to continue to check her sugar 4 times a day, record, let someone when feel  sugars are low.             Pt to f/u with Dr. Abisogun 3/8.            As discussed with pt and granddaughter, plan to discharge pt from community nurse case management               Services and as agreed to transfer  to [facility] health coach.   [facility] CM Care Plan Problem One        Most Recent Value   Care Plan Problem One  Elevated blood sugars    Role Documenting the Problem One  Care Management Coordinator   Care Plan for Problem One  Active   [facility] Long Term Goal (31-90 days)  Pt's blood sugars would be within norm in the next 60 days    [facility] Long Term Goal Start Date  08/02/15   Interventions for Problem One Long  Term Goal  Reviewed with granddaughter to continue to monitor pt for low readings.    [facility] CM Short Term Goal #1 (0-30 days)  Daughter will call MD office if sugars remain high in the next 30 days    [facility] CM Short Term Goal #1 Start Date  08/02/15   [facility] CM Short Term Goal #1 Met Date  -- [not met, pt no longer staying with daughter ]   Interventions for Short Term Goal #1  Discussed with daughter importance of calling MD to report high blood sugars, MD can adjust insulin.     [facility] CM Short Term Goal #2 (0-30 days)  Pt would take insulin correctly in the next 30 days    [facility] CM Short Term Goal #2 Start Date  08/08/15   [facility] CM Short Term Goal #2 Met Date  -- [not met in time frame, pt moved]   Interventions for Short Term Goal #2  Discussed with pt taking her insulin 30 minutes before a meal for effectiveness    [facility] CM Short Term Goal #3 (0-30 days)  Pt's incidents of low blood sugars would decrease within the next 30 days    [facility] CM Short Term Goal #3 Start Date  09/21/15   Interventions for Short Tern Goal #3  Reinforced with granddaughter having snacks available for pt when she feels sugar is low.        [name]   Pierzchala RN CCM [facility] Care Management  [phone]       

## 2015-09-28 ENCOUNTER — Other Ambulatory Visit: Payer: Self-pay | Admitting: *Deleted

## 2015-09-28 NOTE — Patient Outreach (Signed)
[facility]) Care Management  09/28/2015  KYNNEDI DER 1931/08/22 [id number]   RN Health Coach telephone call to patient.  Hipaa compliance verified. Spoke with [place] patient granddaughter. The patient phone is out of order and she is not at the granddaughter house.  Granddaughter is taking phone to be fixed. Granddaughter requested that I call the patient on Monday.  Johny Shock, BSN, RN [facility] Care Management RN Health Coach Phone: [place] complies with applicable Federal civil rights laws and does not discriminate on the basis of race, color, national origin, age, disability, or sex. Espaol (Spanish)  [place] cumple con las leyes federales de derechos civiles aplicables y no discrimina por motivos de raza, color, nacionalidad, edad, discapacidad o sexo.    [place] ([place])  [place] tun th? lu?t dn quy?n hi?n hnh c?a Lin bang v khng phn bi?t ?i x? d?a trn ch?ng t?c, mu da, ngu?n g?c qu?c gia, ? tu?i, khuy?t t?t, ho?c gi?i tnh.    (Arabic)    [place]                      .

## 2015-10-01 DIAGNOSIS — Z79899 Other long term (current) drug therapy: Secondary | ICD-10-CM | POA: Diagnosis not present

## 2015-10-01 DIAGNOSIS — E1165 Type 2 diabetes mellitus with hyperglycemia: Secondary | ICD-10-CM | POA: Diagnosis not present

## 2015-10-01 DIAGNOSIS — Z794 Long term (current) use of insulin: Secondary | ICD-10-CM | POA: Diagnosis not present

## 2015-10-01 DIAGNOSIS — E162 Hypoglycemia, unspecified: Secondary | ICD-10-CM | POA: Diagnosis not present

## 2015-10-05 ENCOUNTER — Telehealth: Payer: Self-pay | Admitting: Internal Medicine

## 2015-10-05 NOTE — Telephone Encounter (Addendum)
Called and left a voicemail for patient to callback and see where she was getting her diabetic supplies.

## 2015-10-08 NOTE — Telephone Encounter (Signed)
LMOMTCB

## 2015-10-10 ENCOUNTER — Encounter: Payer: Self-pay | Admitting: Internal Medicine

## 2015-10-10 ENCOUNTER — Ambulatory Visit (INDEPENDENT_AMBULATORY_CARE_PROVIDER_SITE_OTHER): Payer: Medicare Other | Admitting: Internal Medicine

## 2015-10-10 ENCOUNTER — Ambulatory Visit
Admission: RE | Admit: 2015-10-10 | Discharge: 2015-10-10 | Disposition: A | Discharge status: Home / Self Care | Payer: Medicare Other | Source: Ambulatory Visit | Attending: Internal Medicine | Admitting: Internal Medicine

## 2015-10-10 VITALS — BP 131/75 | HR 93 | Temp 98.0°F

## 2015-10-10 DIAGNOSIS — J189 Pneumonia, unspecified organism: Secondary | ICD-10-CM

## 2015-10-10 DIAGNOSIS — R05 Cough: Secondary | ICD-10-CM | POA: Insufficient documentation

## 2015-10-10 MED ORDER — INSULIN GLARGINE 100 UNIT/ML ~~LOC~~ SOLN: 24.0000 [IU] | Freq: Every day | SUBCUTANEOUS | Status: DC

## 2015-10-10 MED ORDER — LEVOFLOXACIN 250 MG PO TABS: ORAL_TABLET | ORAL | Status: DC

## 2015-10-10 MED ORDER — HYDROCODONE-HOMATROPINE 5-1.5 MG/5ML PO SYRP: 5.0000 mL | ORAL_SOLUTION | Freq: Three times a day (TID) | ORAL | Status: DC | PRN

## 2015-10-10 MED ORDER — INSULIN LISPRO 100 UNIT/ML (KWIKPEN): 8.0000 [IU] | PEN_INJECTOR | Freq: Three times a day (TID) | SUBCUTANEOUS | Status: DC

## 2015-10-10 NOTE — Progress Notes (Signed)
Subjective:    Patient ID: [name] [name], female    DOB: [birth year], 82 y.o.   MRN: [id number]  HPI  82YO female presents for acute visit.  Cough - Started about 3 days ago. No known fever or chills. Cough is productive of yellow mucous. Feeling short of breath. Chest feels tight. Taking some Benadryl with no improvement.  Wt Readings from Last 3 Encounters:  09/21/15 156 lb (70.761 kg)  09/12/15 161 lb (73.029 kg)  08/08/15 154 lb (69.854 kg)   BP Readings from Last 3 Encounters:  10/10/15 131/75  09/21/15 96/52  09/12/15 104/66    Past Medical History  Diagnosis Date  . Diabetes mellitus   . Cancer [facility])     2004 Right breast, found on mammogram, radiation therapy, Dr. Bryson Ha, uterine cancer,   . OSA (obstructive sleep apnea)   . Osteoporosis 02/21/09    DEXA scan showed osteoporosis with left femur T-score -2.8.  Marland Kitchen Thyroid disease     Hypothyroid  . Hyperlipidemia   . CAD (coronary artery disease)   . Anxiety state, unspecified   . Vitamin D deficiency   . Esophageal reflux   . Myocardial infarction [facility])     Cath negative except for 40% occlusion LAD.  Pt not candidate for betablocker or ACEI because of hypotension  . Hypotension   . Presbyacusis   . COPD (chronic obstructive pulmonary disease) ([place])   . Hearing loss   . IBS (irritable bowel syndrome)   . Heart burn    Family History  Problem Relation Age of Onset  . Diabetes Mother   . Heart attack Father    Past Surgical History  Procedure Laterality Date  . Abdominal hysterectomy      uterine cancer  . Breast surgery    . Colon surgery  2013    done at [place] History  . Marital Status: Single    Spouse Name: N/A  . Number of Children: N/A  . Years of Education: N/A   Social History Main Topics  . Smoking status: Never Smoker   . Smokeless tobacco: Never Used  . Alcohol Use: No  . Drug Use: No  . Sexual Activity: Not on file   Other Topics Concern  . Not on  file   Social History Narrative    Review of Systems  Constitutional: Positive for fatigue. Negative for fever, chills and unexpected weight change.  HENT: Positive for congestion. Negative for ear discharge, ear pain, facial swelling, hearing loss, mouth sores, nosebleeds, postnasal drip, rhinorrhea, sinus pressure, sneezing, sore throat, tinnitus, trouble swallowing and voice change.   Eyes: Negative for pain, discharge, redness and visual disturbance.  Respiratory: Positive for cough, chest tightness and shortness of breath. Negative for wheezing and stridor.   Cardiovascular: Negative for chest pain, palpitations and leg swelling.  Musculoskeletal: Negative for myalgias, arthralgias, neck pain and neck stiffness.  Skin: Negative for color change and rash.  Neurological: Negative for dizziness, weakness, light-headedness and headaches.  Hematological: Negative for adenopathy.  Psychiatric/Behavioral: Positive for sleep disturbance.       Objective:    BP 131/75 mmHg  Pulse 93  Temp(Src) 98 F (36.7 C)  SpO2 94% Physical Exam  Constitutional: She is oriented to person, place, and time. She appears well-developed and well-nourished. No distress.  HENT:  Head: Normocephalic and atraumatic.  Right Ear: External ear normal.  Left Ear: External ear normal.  Nose: Nose normal.  Mouth/Throat:  Oropharynx is clear and moist. No oropharyngeal exudate.  Eyes: Conjunctivae are normal. Pupils are equal, round, and reactive to light. Right eye exhibits no discharge. Left eye exhibits no discharge. No scleral icterus.  Neck: Normal range of motion. Neck supple. No tracheal deviation present. No thyromegaly present.  Cardiovascular: Normal rate, regular rhythm, normal heart sounds and intact distal pulses.  Exam reveals no gallop and no friction rub.   No murmur heard. Pulmonary/Chest: Effort normal. No accessory muscle usage. No tachypnea. No respiratory distress. [name] decreased breath  sounds in the left upper field and the left middle field. [name] no wheezes. [name] rhonchi in the left upper field and the left middle field. [name] no rales. She exhibits no tenderness.  Musculoskeletal: Normal range of motion. She exhibits no edema or tenderness.  Lymphadenopathy:    [name] no cervical adenopathy.  Neurological: She is alert and oriented to person, place, and time. No cranial nerve deficit. She exhibits normal muscle tone. Coordination normal.  Skin: Skin is warm and dry. No rash noted. She is not diaphoretic. No erythema. No pallor.  Psychiatric: [name] a normal mood and affect. Her behavior is normal. Judgment and thought content normal.          Assessment & Plan:   Problem List Items Addressed This Visit      Unprioritized   CAP (community acquired pneumonia) - Primary    Symptoms and exam are concerning for left upper lobe pneumonia. Will start Levaquin (renal dosed). Hycodan for cough. Chest xray today. Follow up in 1 week for recheck and sooner as needed.      Relevant Medications   levofloxacin (LEVAQUIN) 250 MG tablet   HYDROcodone-homatropine (HYCODAN) 5-1.5 MG/5ML syrup   Other Relevant Orders   DG Chest 2 View       Return in about 1 week (around 10/17/2015) for Recheck.  Ronette Deter, MD Internal Medicine [facility]

## 2015-10-10 NOTE — Assessment & Plan Note (Signed)
Symptoms and exam are concerning for left upper lobe pneumonia. Will start Levaquin (renal dosed). Hycodan for cough. Chest xray today. Follow up in 1 week for recheck and sooner as needed.

## 2015-10-10 NOTE — Patient Instructions (Signed)
Please go to [facility] for a chest xray.  Start Levaquin to treat infection in your lungs.  Use Hycodan as needed for cough. This medication may make you drowsy.

## 2015-10-15 ENCOUNTER — Ambulatory Visit: Payer: Medicare Other | Admitting: Internal Medicine

## 2015-10-30 DIAGNOSIS — R6889 Other general symptoms and signs: Secondary | ICD-10-CM | POA: Diagnosis not present

## 2015-10-30 DIAGNOSIS — R410 Disorientation, unspecified: Secondary | ICD-10-CM | POA: Diagnosis not present

## 2015-10-30 DIAGNOSIS — R4182 Altered mental status, unspecified: Secondary | ICD-10-CM | POA: Diagnosis not present

## 2015-11-15 ENCOUNTER — Other Ambulatory Visit: Payer: Self-pay

## 2015-11-15 NOTE — Patient Outreach (Signed)
[facility]) Care Management  11/15/2015  [name] [name] 09/08/1931 [id number]  RNCM (covering) called to complete health coach assessment. No answer. HIPPA compliant message left.  Plan: update assigned Health Coach, Johny Shock.  Thea Silversmith, RN, MSN, [place] Coordinator Cell: [phone]

## 2015-12-12 ENCOUNTER — Ambulatory Visit: Payer: Medicare Other | Admitting: Internal Medicine

## 2015-12-12 DIAGNOSIS — Z0289 Encounter for other administrative examinations: Secondary | ICD-10-CM

## 2016-01-04 ENCOUNTER — Ambulatory Visit (INDEPENDENT_AMBULATORY_CARE_PROVIDER_SITE_OTHER): Payer: Medicare Other | Admitting: Internal Medicine

## 2016-01-04 ENCOUNTER — Encounter: Payer: Self-pay | Admitting: Internal Medicine

## 2016-01-04 VITALS — BP 116/60 | HR 92 | Ht 66.0 in | Wt 161.0 lb

## 2016-01-04 DIAGNOSIS — E1122 Type 2 diabetes mellitus with diabetic chronic kidney disease: Secondary | ICD-10-CM | POA: Diagnosis not present

## 2016-01-04 DIAGNOSIS — E1165 Type 2 diabetes mellitus with hyperglycemia: Secondary | ICD-10-CM

## 2016-01-04 DIAGNOSIS — IMO0002 Reserved for concepts with insufficient information to code with codable children: Secondary | ICD-10-CM

## 2016-01-04 DIAGNOSIS — E1151 Type 2 diabetes mellitus with diabetic peripheral angiopathy without gangrene: Secondary | ICD-10-CM | POA: Diagnosis not present

## 2016-01-04 DIAGNOSIS — Z602 Problems related to living alone: Secondary | ICD-10-CM

## 2016-01-04 DIAGNOSIS — N183 Chronic kidney disease, stage 3 (moderate): Secondary | ICD-10-CM

## 2016-01-04 DIAGNOSIS — E039 Hypothyroidism, unspecified: Secondary | ICD-10-CM | POA: Diagnosis not present

## 2016-01-04 LAB — COMPREHENSIVE METABOLIC PANEL
ALT: 12 U/L (ref 0–35)
AST: 14 U/L (ref 0–37)
Albumin: 4 g/dL (ref 3.5–5.2)
Alkaline Phosphatase: 85 U/L (ref 39–117)
BILIRUBIN TOTAL: 0.5 mg/dL (ref 0.2–1.2)
BUN: 17 mg/dL (ref 6–23)
CALCIUM: 9.4 mg/dL (ref 8.4–10.5)
CHLORIDE: 102 meq/L (ref 96–112)
CO2: 30 meq/L (ref 19–32)
CREATININE: 1.01 mg/dL (ref 0.40–1.20)
GFR: 55.8 mL/min — ABNORMAL LOW (ref >60.00)
Glucose, Bld: 162 mg/dL — ABNORMAL HIGH (ref 70–99)
Potassium: 4.2 mEq/L (ref 3.5–5.1)
SODIUM: 137 meq/L (ref 135–145)
Total Protein: 6.8 g/dL (ref 6.0–8.3)

## 2016-01-04 LAB — HEMOGLOBIN A1C: Hgb A1c MFr Bld: 9.1 % — ABNORMAL HIGH (ref 4.6–6.5)

## 2016-01-04 LAB — TSH: TSH: 7.77 u[IU]/mL — ABNORMAL HIGH (ref 0.35–4.50)

## 2016-01-04 MED ORDER — LEVOTHYROXINE SODIUM 88 MCG PO TABS: 88.0000 ug | ORAL_TABLET | Freq: Every day | ORAL | Status: DC

## 2016-01-04 MED ORDER — PANTOPRAZOLE SODIUM 40 MG PO TBEC: 40.0000 mg | DELAYED_RELEASE_TABLET | Freq: Two times a day (BID) | ORAL | Status: DC

## 2016-01-04 NOTE — Assessment & Plan Note (Signed)
Will check TSH with labs. Continue Levothyroxine. 

## 2016-01-04 NOTE — Assessment & Plan Note (Signed)
Will check renal function with labs. 

## 2016-01-04 NOTE — Patient Instructions (Signed)
Labs today.  Continue current medications.  Follow up in 3 months. 

## 2016-01-04 NOTE — Progress Notes (Signed)
Subjective:    Patient ID: [name] [name], female    DOB: 1932-07-26, 82 y.o.   MRN: [id number]  HPI  82YO female presents for follow up.  DM - BG continue to be up and down. Some BG above 250. Few below 70, however rare. Now back living alone. 2nd story apartment. Looking for 1st floor apartment. Much less support. Son lives with her, but he is disabled.  Generally, feeling well. No other concerns today.  Wt Readings from Last 3 Encounters:  01/04/16 161 lb (73.029 kg)  09/21/15 156 lb (70.761 kg)  09/12/15 161 lb (73.029 kg)   BP Readings from Last 3 Encounters:  01/04/16 116/60  10/10/15 131/75  09/21/15 96/52    Past Medical History  Diagnosis Date  . Diabetes mellitus   . Cancer [facility])     2004 Right breast, found on mammogram, radiation therapy, Dr. Bryson Ha, uterine cancer,   . OSA (obstructive sleep apnea)   . Osteoporosis 02/21/09    DEXA scan showed osteoporosis with left femur T-score -2.8.  Marland Kitchen Thyroid disease     Hypothyroid  . Hyperlipidemia   . CAD (coronary artery disease)   . Anxiety state, unspecified   . Vitamin D deficiency   . Esophageal reflux   . Myocardial infarction [facility])     Cath negative except for 40% occlusion LAD.  Pt not candidate for betablocker or ACEI because of hypotension  . Hypotension   . Presbyacusis   . COPD (chronic obstructive pulmonary disease) ([place])   . Hearing loss   . IBS (irritable bowel syndrome)   . Heart burn    Family History  Problem Relation Age of Onset  . Diabetes Mother   . Heart attack Father    Past Surgical History  Procedure Laterality Date  . Abdominal hysterectomy      uterine cancer  . Breast surgery    . Colon surgery  2013    done at [place] History  . Marital Status: Single    Spouse Name: N/A  . Number of Children: N/A  . Years of Education: N/A   Social History Main Topics  . Smoking status: Never Smoker   . Smokeless tobacco: Never Used  . Alcohol Use: No   . Drug Use: No  . Sexual Activity: Not Asked   Other Topics Concern  . None   Social History Narrative    Review of Systems  Constitutional: Negative for fever, chills, appetite change, fatigue and unexpected weight change.  Eyes: Negative for visual disturbance.  Respiratory: Negative for shortness of breath.   Cardiovascular: Negative for chest pain, palpitations and leg swelling.  Gastrointestinal: Negative for nausea, vomiting, abdominal pain, diarrhea and constipation.  Musculoskeletal: Positive for gait problem (unsteady). Negative for myalgias and arthralgias.  Skin: Negative for color change and rash.  Hematological: Negative for adenopathy. Does not bruise/bleed easily.  Psychiatric/Behavioral: Negative for sleep disturbance and dysphoric mood. The patient is not nervous/anxious.        Objective:    BP 116/60 mmHg  Pulse 92  Ht 5\' 6"  (1.676 m)  Wt 161 lb (73.029 kg)  BMI 26.00 kg/m2  SpO2 97% Physical Exam  Constitutional: She is oriented to person, place, and time. She appears well-developed and well-nourished. No distress.  HENT:  Head: Normocephalic and atraumatic.  Right Ear: External ear normal.  Left Ear: External ear normal.  Nose: Nose normal.  Mouth/Throat: Oropharynx is clear and moist.  Eyes: Conjunctivae are normal. Pupils are equal, round, and reactive to light. Right eye exhibits no discharge. Left eye exhibits no discharge. No scleral icterus.  Neck: Normal range of motion. Neck supple. No tracheal deviation present. No thyromegaly present.  Cardiovascular: Normal rate, regular rhythm, normal heart sounds and intact distal pulses.  Exam reveals no gallop and no friction rub.   No murmur heard. Pulmonary/Chest: Effort normal and breath sounds normal. No respiratory distress. [name] no wheezes. [name] no rales. She exhibits no tenderness.  Musculoskeletal: Normal range of motion. She exhibits no edema or tenderness.  Lymphadenopathy:    [name]  no cervical adenopathy.  Neurological: She is alert and oriented to person, place, and time. No cranial nerve deficit. She exhibits normal muscle tone. Coordination normal.  Skin: Skin is warm and dry. No rash noted. She is not diaphoretic. No erythema. No pallor.  Psychiatric: [name] a normal mood and affect. Her behavior is normal. Judgment and thought content normal.          Assessment & Plan:   Problem List Items Addressed This Visit      Unprioritized   CKD stage 3 due to type 2 diabetes mellitus ([place])    Will check renal function with labs.      Relevant Medications   LANTUS SOLOSTAR 100 UNIT/ML Solostar Pen   DM (diabetes mellitus) type II uncontrolled, periph vascular disorder (HCC) - Primary    BG variable and less well controlled with new living situation. Will recheck A1c with labs. Continue current medications.      Relevant Medications   LANTUS SOLOSTAR 100 UNIT/ML Solostar Pen   Other Relevant Orders   Comprehensive metabolic panel   Hemoglobin A1c   Elderly person living alone    She is now living alone, except for when her disabled son stays with her. Encouraged her to look into options including living with her granddaughter or son. Encouraged her to look into a 1st floor apartment. She is at high risk of falls and [name] very labile BG.      Hypothyroidism    Will check TSH with labs. Continue Levothyroxine.      Relevant Medications   levothyroxine (SYNTHROID, LEVOTHROID) 88 MCG tablet   Other Relevant Orders   TSH       Return in about 3 months (around 04/05/2016) for Recheck of Diabetes.  Ronette Deter, MD Internal Medicine [facility]

## 2016-01-04 NOTE — Assessment & Plan Note (Signed)
BG variable and less well controlled with new living situation. Will recheck A1c with labs. Continue current medications.

## 2016-01-04 NOTE — Assessment & Plan Note (Signed)
She is now living alone, except for when her disabled son stays with her. Encouraged her to look into options including living with her granddaughter or son. Encouraged her to look into a 1st floor apartment. She is at high risk of falls and has very labile BG.

## 2016-01-10 ENCOUNTER — Telehealth: Payer: Self-pay | Admitting: Internal Medicine

## 2016-01-10 ENCOUNTER — Telehealth: Payer: Self-pay

## 2016-01-10 DIAGNOSIS — R7989 Other specified abnormal findings of blood chemistry: Secondary | ICD-10-CM

## 2016-01-10 NOTE — Telephone Encounter (Signed)
Pt called returning your call regarding lab results.    Call pt @ [phone]. Thank you!

## 2016-01-10 NOTE — Telephone Encounter (Signed)
Lab ordered placed

## 2016-03-11 ENCOUNTER — Encounter: Payer: Self-pay | Admitting: Emergency Medicine

## 2016-03-11 ENCOUNTER — Emergency Department
Admission: EM | Admit: 2016-03-11 | Discharge: 2016-03-11 | Disposition: A | Discharge status: Home / Self Care | Payer: Medicare Other | Attending: Emergency Medicine | Admitting: Emergency Medicine

## 2016-03-11 ENCOUNTER — Emergency Department: Payer: Medicare Other

## 2016-03-11 DIAGNOSIS — J449 Chronic obstructive pulmonary disease, unspecified: Secondary | ICD-10-CM | POA: Diagnosis not present

## 2016-03-11 DIAGNOSIS — E039 Hypothyroidism, unspecified: Secondary | ICD-10-CM | POA: Diagnosis not present

## 2016-03-11 DIAGNOSIS — E1122 Type 2 diabetes mellitus with diabetic chronic kidney disease: Secondary | ICD-10-CM | POA: Diagnosis not present

## 2016-03-11 DIAGNOSIS — Z79899 Other long term (current) drug therapy: Secondary | ICD-10-CM | POA: Insufficient documentation

## 2016-03-11 DIAGNOSIS — J189 Pneumonia, unspecified organism: Secondary | ICD-10-CM | POA: Diagnosis not present

## 2016-03-11 DIAGNOSIS — Z853 Personal history of malignant neoplasm of breast: Secondary | ICD-10-CM | POA: Insufficient documentation

## 2016-03-11 DIAGNOSIS — Z7984 Long term (current) use of oral hypoglycemic drugs: Secondary | ICD-10-CM | POA: Insufficient documentation

## 2016-03-11 DIAGNOSIS — Z794 Long term (current) use of insulin: Secondary | ICD-10-CM | POA: Diagnosis not present

## 2016-03-11 DIAGNOSIS — N183 Chronic kidney disease, stage 3 (moderate): Secondary | ICD-10-CM | POA: Insufficient documentation

## 2016-03-11 DIAGNOSIS — E1165 Type 2 diabetes mellitus with hyperglycemia: Secondary | ICD-10-CM | POA: Insufficient documentation

## 2016-03-11 DIAGNOSIS — I2510 Atherosclerotic heart disease of native coronary artery without angina pectoris: Secondary | ICD-10-CM | POA: Insufficient documentation

## 2016-03-11 LAB — URINALYSIS COMPLETE WITH MICROSCOPIC (ARMC ONLY)
BILIRUBIN URINE: NEGATIVE
Bacteria, UA: NONE SEEN
Hgb urine dipstick: NEGATIVE
Leukocytes, UA: NEGATIVE
Nitrite: NEGATIVE
PROTEIN: NEGATIVE mg/dL
RBC / HPF: NONE SEEN RBC/hpf (ref 0–5)
Specific Gravity, Urine: 1.018 (ref 1.005–1.030)
pH: 5 (ref 5.0–8.0)

## 2016-03-11 LAB — CBC
HCT: 39.3 % (ref 35.0–47.0)
Hemoglobin: 13.2 g/dL (ref 12.0–16.0)
MCH: 32.4 pg (ref 26.0–34.0)
MCHC: 33.5 g/dL (ref 32.0–36.0)
MCV: 96.6 fL (ref 80.0–100.0)
PLATELETS: 281 10*3/uL (ref 150–440)
RBC: 4.06 MIL/uL (ref 3.80–5.20)
RDW: 13.7 % (ref 11.5–14.5)
WBC: 6.1 10*3/uL (ref 3.6–11.0)

## 2016-03-11 LAB — BLOOD GAS, VENOUS
Acid-Base Excess: 2.2 mmol/L (ref 0.0–3.0)
Bicarbonate: 27.8 mEq/L (ref 21.0–28.0)
PCO2 VEN: 46 mmHg (ref 44.0–60.0)
Patient temperature: 37
pH, Ven: 7.39 (ref 7.320–7.430)

## 2016-03-11 LAB — BASIC METABOLIC PANEL
Anion gap: 12 (ref 5–15)
BUN: 29 mg/dL — AB (ref 6–20)
CALCIUM: 9.9 mg/dL (ref 8.9–10.3)
CHLORIDE: 97 mmol/L — AB (ref 101–111)
CO2: 22 mmol/L (ref 22–32)
CREATININE: 1.41 mg/dL — AB (ref 0.44–1.00)
GFR calc Af Amer: 39 mL/min — ABNORMAL LOW (ref >60)
GFR calc non Af Amer: 34 mL/min — ABNORMAL LOW (ref >60)
Glucose, Bld: 313 mg/dL — ABNORMAL HIGH (ref 65–99)
Potassium: 4.2 mmol/L (ref 3.5–5.1)
SODIUM: 131 mmol/L — AB (ref 135–145)

## 2016-03-11 LAB — GLUCOSE, CAPILLARY: GLUCOSE-CAPILLARY: 193 mg/dL — AB (ref 65–99)

## 2016-03-11 LAB — TROPONIN I

## 2016-03-11 MED ORDER — AZITHROMYCIN 500 MG PO TABS
500.0000 mg | ORAL_TABLET | Freq: Once | ORAL | Status: AC
  Administered 2016-03-11: 500 mg via ORAL
  Filled 2016-03-11: qty 1

## 2016-03-11 MED ORDER — ONDANSETRON HCL 4 MG/2ML IJ SOLN
4.0000 mg | Freq: Once | INTRAMUSCULAR | Status: AC
  Administered 2016-03-11: 4 mg via INTRAVENOUS
  Filled 2016-03-11: qty 2

## 2016-03-11 MED ORDER — SODIUM CHLORIDE 0.9 % IV BOLUS (SEPSIS)
1000.0000 mL | Freq: Once | INTRAVENOUS | Status: AC
  Administered 2016-03-11: 1000 mL via INTRAVENOUS

## 2016-03-11 MED ORDER — AZITHROMYCIN 250 MG PO TABS
ORAL_TABLET | ORAL | 0 refills | Status: DC
Start: 2016-03-11 — End: 2016-04-09

## 2016-03-11 NOTE — ED Provider Notes (Signed)
[facility] Emergency Department Provider Note  ____________________________________________  Time seen: Approximately 7:34 PM  I have reviewed the triage vital signs and the nursing notes.   HISTORY  Chief Complaint Hyperglycemia   HPI [name] [name] is a 82 y.o. female history of CAD, breast cancer, COPD, IDDM2, hyperlipidemia, hypothyroidism who presents for evaluation of fatigue and elevated sugars. Patient reports that since yesterday she's been feeling very tired. Today she had multiple episodes of nonbloody nonbilious emesis. She also reports polydipsia and polyuria. She denies headache, changes in vision, chest pain, shortness of breath, fever, abdominal pain, diarrhea, constipation, dysuria, hematuria. She reports that her sugars were in the 400s today which prompted her family to call the ambulance. She denies any changes to her current regimen of insulin. She reports she's been taking her medications as prescribed.  Past Medical History:  Diagnosis Date  . Anxiety state, unspecified   . CAD (coronary artery disease)   . Cancer [facility])    2004 Right breast, found on mammogram, radiation therapy, Dr. Bryson Ha, uterine cancer,   . COPD (chronic obstructive pulmonary disease) ([place])   . Diabetes mellitus   . Esophageal reflux   . Hearing loss   . Heart burn   . Hyperlipidemia   . Hypotension   . IBS (irritable bowel syndrome)   . Myocardial infarction [facility])    Cath negative except for 40% occlusion LAD.  Pt not candidate for betablocker or ACEI because of hypotension  . OSA (obstructive sleep apnea)   . Osteoporosis 02/21/09   DEXA scan showed osteoporosis with left femur T-score -2.8.  Marland Kitchen Presbyacusis   . Thyroid disease    Hypothyroid  . Vitamin D deficiency     Patient Active Problem List   Diagnosis Date Noted  . Elderly person living alone 01/04/2016  . Long term current use of insulin ([place]) 03/08/2014  . Type 2 diabetes mellitus with  renal manifestations not at goal [facility]) 02/22/2014  . CKD stage 3 due to type 2 diabetes mellitus ([place]) 02/22/2014  . Hypothyroidism 09/23/2012  . GERD (gastroesophageal reflux disease) 09/23/2012  . Anxiety 07/26/2012  . Hyperlipidemia with target LDL less than 70 12/03/2011  . Mesenteric ischemia 07/16/2011  . DM (diabetes mellitus) type II uncontrolled, periph vascular disorder ([place]) 04/03/2011  . CAD, NATIVE VESSEL 12/13/2009    Past Surgical History:  Procedure Laterality Date  . ABDOMINAL HYSTERECTOMY     uterine cancer  . BREAST SURGERY    . COLON SURGERY  2013   done at [facility]    Prior to Admission medications   Medication Sig Start Date End Date Taking? Authorizing Provider  azithromycin (ZITHROMAX) 250 MG tablet 1 tablet a day 03/11/16   Rudene Re, MD  fluticasone [facility]) 50 MCG/ACT nasal spray Place into both nostrils daily.    Historical Provider, MD  insulin lispro (HUMALOG) 100 UNIT/ML KiwkPen Inject 0.08 mLs (8 Units total) into the skin 3 (three) times daily. 10/10/15 10/09/16  Jackolyn Confer, MD  LANTUS SOLOSTAR 100 UNIT/ML Solostar Pen INJECT 24 UNITS SUBCUTANEOUSLY NIGHTLY. 10/30/15   Historical Provider, MD  levothyroxine (SYNTHROID, LEVOTHROID) 88 MCG tablet Take 1 tablet (88 mcg total) by mouth daily. 01/04/16   Jackolyn Confer, MD  metFORMIN (GLUCOPHAGE) 500 MG tablet Take 1 tablet (500 mg total) by mouth 2 (two) times daily with a meal. 08/03/15   Jackolyn Confer, MD  pantoprazole (PROTONIX) 40 MG tablet Take 1 tablet (40 mg total) by mouth  2 (two) times daily. 01/04/16   Jackolyn Confer, MD    Allergies Penicillins  Family History  Problem Relation Age of Onset  . Diabetes Mother   . Heart attack Father     Social History Social History  Substance Use Topics  . Smoking status: Never Smoker  . Smokeless tobacco: Never Used  . Alcohol use No    Review of Systems  Constitutional: Negative for fever. + fatigue Eyes: Negative for visual  changes. ENT: Negative for sore throat. Cardiovascular: Negative for chest pain. Respiratory: Negative for shortness of breath. Gastrointestinal: Negative for abdominal pain, vomiting or diarrhea. Genitourinary: Negative for dysuria. Musculoskeletal: Negative for back pain. Skin: Negative for rash. Neurological: Negative for headaches, weakness or numbness.  ____________________________________________   PHYSICAL EXAM:  VITAL SIGNS: ED Triage Vitals  Enc Vitals Group     BP 03/11/16 1922 118/90     Pulse Rate 03/11/16 1922 95     Resp --      Temp 03/11/16 1922 97.8 F (36.6 C)     Temp Source 03/11/16 1922 Oral     SpO2 03/11/16 1922 96 %     Weight 03/11/16 1923 152 lb 14.4 oz (69.4 kg)     Height 03/11/16 1923 5\' 6"  (1.676 m)     Head Circumference --      Peak Flow --      Pain Score 03/11/16 1923 0     Pain Loc --      Pain Edu? --      Excl. in [place]? --     Constitutional: Alert and oriented. Well appearing and in no apparent distress. HEENT:      Head: Normocephalic and atraumatic.         Eyes: Conjunctivae are normal. Sclera is non-icteric. EOMI. PERRL      Mouth/Throat: Mucous membranes are dry.       Neck: Supple with no signs of meningismus. Cardiovascular: Regular rate and rhythm. No murmurs, gallops, or rubs. 2+ symmetrical distal pulses are present in all extremities. No JVD. Respiratory: Normal respiratory effort. Lungs are clear to auscultation bilaterally. No wheezes, crackles, or rhonchi.  Gastrointestinal: Soft, non tender, and non distended with positive bowel sounds. No rebound or guarding. Genitourinary: No CVA tenderness. Musculoskeletal: Nontender with normal range of motion in all extremities. No edema, cyanosis, or erythema of extremities. Neurologic: Normal speech and language. Face is symmetric. Moving all extremities. No gross focal neurologic deficits are appreciated. Skin: Skin is warm, dry and intact. No rash noted. Psychiatric: Mood and  affect are normal. Speech and behavior are normal.  ____________________________________________   LABS (all labs ordered are listed, but only abnormal results are displayed)  Labs Reviewed  BASIC METABOLIC PANEL - Abnormal; Notable for the following:       Result Value   Sodium 131 (*)    Chloride 97 (*)    Glucose, Bld 313 (*)    BUN 29 (*)    Creatinine, Ser 1.41 (*)    GFR calc non Af Amer 34 (*)    GFR calc Af Amer 39 (*)    All other components within normal limits  URINALYSIS COMPLETEWITH MICROSCOPIC ([facility] ONLY) - Abnormal; Notable for the following:    Color, Urine YELLOW (*)    APPearance CLEAR (*)    Glucose, UA >500 (*)    Ketones, ur 2+ (*)    Squamous Epithelial / LPF 0-5 (*)    All other components within normal limits  BLOOD GAS, VENOUS - Abnormal; Notable for the following:    pO2, Ven <31.0 (*)    All other components within normal limits  GLUCOSE, CAPILLARY - Abnormal; Notable for the following:    Glucose-Capillary 193 (*)    All other components within normal limits  URINE CULTURE  CBC  TROPONIN I  CBG MONITORING, ED  CBG MONITORING, ED   ____________________________________________  EKG  ED ECG REPORT I, Rudene Re, the attending physician, personally viewed and interpreted this ECG. Sinus rhythm, rate of 93, normal intervals, normal axis, no ST elevations or depressions. ____________________________________________  RADIOLOGY  CXR:  1. Posterior medial right lower lobe pneumonia. 2. Emphysema. 3. Atherosclerosis of the thoracic aorta.  ____________________________________________   PROCEDURES  Procedure(s) performed: None Procedures Critical Care performed:  None ____________________________________________   INITIAL IMPRESSION / ASSESSMENT AND PLAN / ED COURSE  82 y.o. female history of CAD, breast cancer, COPD, IDDM2, hyperlipidemia, hypothyroidism who presents for evaluation of 2 days of fatigue, polyuria, polydipsia,  and elevated sugars. Patient is well-appearing, in no distress, she has dry mouth mucous membranes, her pulse is 95, remaining of her vital signs are within normal limits, her physical exam is otherwise reassuring. EKG with no evidence of ischemia. BG per EMS 413. We'll check labs to rule out DKA, UA to evaluate for evidence of urinary tract infection, chest x-ray to look for infection. Will get troponin to rule out ACS. We'll give her IV fluids.  Clinical Course  Comment By Time  Patient with PNA on CXR. Normal WBC and afebrile. No evidence of DKA. Will treat CAPNA with azithromycin. UA pending.  Rudene Re, MD 08/15 2023  UA with no evidence of infection. Patient with no evidence of acidosis. Patient's grandson is at the bedside and reports the patient is back to her baseline. There is vital signs documented from 10:30 PM of 60/49 however blood pressure cuff was off patient's arm. I repeated the blood pressure at this time and is 110/56, her pulse is 76, and she remains afebrile. Patient was started on azithromycin and will discharged home on 4 more days. She will follow-up with her primary care doctor. Her grandson is here and feels comfortable with the plan and with taking patient home. Rudene Re, MD 08/15 2325   Repeat BG 193 after IVF. Patient reports that she feels back to baseline.  Pertinent labs & imaging results that were available during my care of the patient were reviewed by me and considered in my medical decision making (see chart for details).    ____________________________________________   FINAL CLINICAL IMPRESSION(S) / ED DIAGNOSES  Final diagnoses:  Community acquired pneumonia  Type 2 diabetes mellitus with hyperglycemia, unspecified long term insulin use status ([place])      NEW MEDICATIONS STARTED DURING THIS VISIT:  New Prescriptions   AZITHROMYCIN (ZITHROMAX) 250 MG TABLET    1 tablet a day     Note:  This document was prepared using Dragon voice  recognition software and may include unintentional dictation errors.    Rudene Re, MD 03/11/16 2328

## 2016-03-11 NOTE — ED Triage Notes (Signed)
Per [facility]: Pt. Reports fatigue, increased thirst and urination, and is disoriented per baseline. CBG with EMS was above 413. Pt. Is a known diabetic taking metformin and lantus

## 2016-03-11 NOTE — ED Notes (Signed)
Pt. Verbalizes understanding of d/c instructions, prescriptions, and follow-up. VS stable and pain controlled per pt.  Pt. In NAD at time of d/c and denies further concerns regarding this visit. Pt.wheeled Out of the unit by family and RN in good spirits. Pt advised to return to the ED at any time for emergent concerns, or for new/worsening symptoms.

## 2016-03-11 NOTE — Discharge Instructions (Signed)

## 2016-03-13 LAB — URINE CULTURE: Culture: NO GROWTH

## 2016-03-17 ENCOUNTER — Other Ambulatory Visit: Payer: Self-pay | Admitting: *Deleted

## 2016-03-17 NOTE — Patient Outreach (Signed)
[place] [facility]) Care Management  03/17/2016  [name] [name] 1931/11/11 [id number]  RN [place] Coach attempted  Follow up outreach call to patient.  Patient was unavailable. HIPPA compliance voicemail message was left with return callback number.   Plan: RN will call patient again within 14 days.    [place] Care Management [phone]

## 2016-03-20 ENCOUNTER — Encounter: Payer: Self-pay | Admitting: Family Medicine

## 2016-03-20 ENCOUNTER — Ambulatory Visit (INDEPENDENT_AMBULATORY_CARE_PROVIDER_SITE_OTHER): Payer: Medicare Other | Admitting: Family Medicine

## 2016-03-20 DIAGNOSIS — J189 Pneumonia, unspecified organism: Secondary | ICD-10-CM | POA: Insufficient documentation

## 2016-03-20 DIAGNOSIS — E1165 Type 2 diabetes mellitus with hyperglycemia: Secondary | ICD-10-CM

## 2016-03-20 DIAGNOSIS — I2510 Atherosclerotic heart disease of native coronary artery without angina pectoris: Secondary | ICD-10-CM | POA: Diagnosis not present

## 2016-03-20 DIAGNOSIS — E1151 Type 2 diabetes mellitus with diabetic peripheral angiopathy without gangrene: Secondary | ICD-10-CM | POA: Diagnosis not present

## 2016-03-20 DIAGNOSIS — IMO0002 Reserved for concepts with insufficient information to code with codable children: Secondary | ICD-10-CM

## 2016-03-20 NOTE — Progress Notes (Signed)
Subjective:  Patient ID: [name] [name], female    DOB: 1931/11/09  Age: 82 y.o. MRN: [id number]  CC: ED follow up  HPI:  82 year old female with DM 2 uncontrolled with complications, CAD, hyperlipidemia, CKD presents for ED follow-up.  Patient was seen in the ED on 8/14. She presented with fatigue/weakness and elevated blood sugar. After workup, she was found to have been acquired pneumonia on x-ray. Was not in DKA. She was treated IV fluids and azithromycin and was discharged home in stable condition.  She presents today for follow-up. She is accompanied by her great granddaughter in Sports coach. Patient states that she's been weak but is otherwise doing okay. Great granddaughter in law notes that she's had dark urine and [name] been feeding frequently. She is concerned about UTI. No fevers or chills. No reported dysuria currently. No reports of shortness of breath. She's completed her course of antibiotics. No other complaints or concerns at this time.  Social Hx   Social History   Social History  . Marital status: Single    Spouse name: N/A  . Number of children: N/A  . Years of education: N/A   Social History Main Topics  . Smoking status: Never Smoker  . Smokeless tobacco: Never Used  . Alcohol use No  . Drug use: No  . Sexual activity: Not on file   Other Topics Concern  . Not on file   Social History Narrative  . No narrative on file   Review of Systems  Constitutional: Positive for fatigue.  Respiratory: Negative for shortness of breath.   Neurological: Positive for weakness.   Objective:  BP 102/64   Pulse 88   Temp 98.2 F (36.8 C) (Oral)   Ht 5\' 6"  (1.676 m)   SpO2 91%   BP/Weight 03/20/2016 0000000 [id number]  Systolic BP A999333 0000000 [id number]  Diastolic BP 64 93 60  Wt. (Lbs) - 152.9 161  BMI - 24.68 26   Physical Exam  Constitutional:  Elderly female in acute distress.  Cardiovascular: Normal rate and regular rhythm.   Pulmonary/Chest: Effort normal.  Bibasilar  crackles.  Abdominal: Soft. She exhibits no distension. There is no tenderness.  Neurological: She is alert.  Psychiatric: [name] a normal mood and affect.  Vitals reviewed.  Lab Results  Component Value Date   WBC 6.1 03/11/2016   HGB 13.2 03/11/2016   HCT 39.3 03/11/2016   PLT 281 03/11/2016   GLUCOSE 313 (H) 03/11/2016   CHOL 307 (H) 12/30/2013   TRIG 92.0 12/30/2013   HDL 123.40 12/30/2013   LDLDIRECT 138.1 06/20/2013   LDLCALC 165 (H) 12/30/2013   ALT 12 01/04/2016   AST 14 01/04/2016   NA 131 (L) 03/11/2016   K 4.2 03/11/2016   CL 97 (L) 03/11/2016   CREATININE 1.41 (H) 03/11/2016   BUN 29 (H) 03/11/2016   CO2 22 03/11/2016   TSH 7.77 (H) 01/04/2016   HGBA1C 9.1 (H) 01/04/2016   MICROALBUR 1.7 09/27/2013    Assessment & Plan:   Problem List Items Addressed This Visit    CAD, NATIVE VESSEL    Patient with a documented history of MI. Unsure why patient is not on secondary prevention with aspirin and statin. Records indicated she is not on a cervical block her as she could not tolerate due to hypotension. Advised family member to inform daughter to come with her at next visit to discuss.      DM (diabetes mellitus) type II uncontrolled, periph vascular  disorder ([place])    Diabetes is uncontrolled and [name] never been in control. Advised family member that she needs close follow-up with endocrinology.      CAP (community acquired pneumonia)    New problem. [name] completed antibiotic course. Exam nonfocal. We'll obtain x-ray for reevaluation in 2 weeks.       Other Visit Diagnoses   None.    Follow-up: 2 weeks.  [place]

## 2016-03-20 NOTE — Patient Instructions (Signed)
Continue her current meds.  Schedule follow up with [name].  Follow up with me in 2 weeks.  Take care  Dr. Lacinda Axon

## 2016-03-20 NOTE — Progress Notes (Signed)
Pre visit review using our clinic review tool, if applicable. No additional management support is needed unless otherwise documented below in the visit note. 

## 2016-03-20 NOTE — Assessment & Plan Note (Signed)
Diabetes is uncontrolled and has never been in control. Advised family member that she needs close follow-up with endocrinology.

## 2016-03-20 NOTE — Assessment & Plan Note (Signed)
New problem. Has completed antibiotic course. Exam nonfocal. We'll obtain x-ray for reevaluation in 2 weeks.

## 2016-03-20 NOTE — Assessment & Plan Note (Addendum)
Patient with a documented history of MI. Unsure why patient is not on secondary prevention with aspirin and statin. Records indicated she is not on a beta blocker/ACEI as she could not tolerate due to hypotension. Advised family member to inform daughter to come with her at next visit to discuss.

## 2016-04-01 ENCOUNTER — Encounter: Payer: Self-pay | Admitting: *Deleted

## 2016-04-01 ENCOUNTER — Other Ambulatory Visit: Payer: Self-pay | Admitting: *Deleted

## 2016-04-01 NOTE — Patient Outreach (Signed)
[place] [facility]) Care Management  04/01/2016  [name] [name] Aug 10, 1931 [id number]   RN Health Coach  attempted  2nd Follow up outreach call to patient.  Patient was unavailable. HIPPA compliance voicemail message was left with return callback number.  Plan: RN will call patient again within 14 days.    [place] Care Management [phone]

## 2016-04-01 NOTE — Patient Outreach (Signed)
[place] [facility]) Care Management  04/01/2016   [name] [name] 09-Jan-1932 [id number]  Subjective: RN Health Coach telephone call to patient.  Hipaa compliance verified. Per patient she had went to the dr 03/20/2016. Patient fasting blood sugar this morning was 104. Patient stated she is having 4-5 episodes of low blood sugar a week. Patient stated she never eats a snack. She is taking her oral agent and insulin as ordered. Patient stated she feel woozy and out of sorts when her blood sugar is low so she knows she needs to eat something.  Patient stated when it is high she feels dizzy so she drinks plenty of water. Patient lives with son and he has a caregiver to come in also.  Patient stated sometimes she has trouble remembering things. Patient has bilateral hearing aids and ambulates with a cane. Patient stated she doesn't go to a foot Dr. She goes to a nail salon and they trim her toenails. Patient has agreed to follow up outreach calls.    Objective:   Current Medications:  Current Outpatient Prescriptions  Medication Sig Dispense Refill  . azithromycin (ZITHROMAX) 250 MG tablet 1 tablet a day 4 each 0  . fluticasone (FLONASE) 50 MCG/ACT nasal spray Place into both nostrils daily.    . insulin lispro (HUMALOG) 100 UNIT/ML KiwkPen Inject 0.08 mLs (8 Units total) into the skin 3 (three) times daily. 15 mL 6  . LANTUS SOLOSTAR 100 UNIT/ML Solostar Pen INJECT 24 UNITS SUBCUTANEOUSLY NIGHTLY.  12  . levothyroxine (SYNTHROID, LEVOTHROID) 88 MCG tablet Take 1 tablet (88 mcg total) by mouth daily. 90 tablet 3  . metFORMIN (GLUCOPHAGE) 500 MG tablet Take 1 tablet (500 mg total) by mouth 2 (two) times daily with a meal. 180 tablet 3  . pantoprazole (PROTONIX) 40 MG tablet Take 1 tablet (40 mg total) by mouth 2 (two) times daily. 180 tablet 3   No current facility-administered medications for this visit.     Functional Status:  In your present state of health, do you have any  difficulty performing the following activities: 04/01/2016 06/08/2015  Hearing? Tempie Donning  Vision? N N  Difficulty concentrating or making decisions? Y N  Walking or climbing stairs? Y N  Dressing or bathing? N N  Doing errands, shopping? Tempie Donning  Preparing Food and eating ? N -  Using the Toilet? N -  In the past six months, have you accidently leaked urine? N -  Do you have problems with loss of bowel control? N -  Managing your Medications? Y -  Managing your Finances? Y -  Housekeeping or managing your Housekeeping? Y -  Some recent data might be hidden    Fall/Depression Screening: PHQ 2/9 Scores 04/01/2016 01/04/2016 08/15/2014 07/26/2012 06/15/2012  PHQ - 2 Score 0 0 0 0 3   THN CM Care Plan Problem One   Flowsheet Row Most Recent Value  Care Plan Problem One  Knowledge Deficit in Self Management of Diabetes  Role Documenting the Problem One  [place] for Problem One  Active  THN Long Term Goal (31-90 days)  Patient will not have any hypoglycemic reactions within the next 90 days  THN Long Term Goal Start Date  04/01/16  Interventions for Problem One Long Term Goal  RN discussed with patient about healthy snacks between meals and at HS. RN discussed with patient when to check blood sugars. RN will sent patient picture chart of hyper and hypoglycemic reactions. RN  sent patient educational material on treating high and low blood sugar  THN CM Short Term Goal #1 (0-30 days)  Patient and caregiver will be able to describe healthy snacks patient can eat  THN CM Short Term Goal #1 Start Date  04/01/16  Interventions for Short Term Goal #1  RN sent a list of healthy snacks patient can eat. RN discussed when to eat the snacks  THN CM Short Term Goal #2 (0-30 days)  Patient will report trying to do some chair exercises within the next 30 days  Interventions for Short Term Goal #2  RN sent patient a picture chart of chair exercises she can try to do.   THN CM Short Term Goal #3 (0-30  days)  Patient will be able to understand what the A1C means within the next 30 days  THN CM Short Term Goal #3 Start Date  04/01/16  Interventions for Short Tern Goal #3  RN discussed with patient what her A1c was and what it means. RN sent educational material for caregiver and son to look at also with patient. RN will follow up       Assessment:  Patient is having 4-5 episodes of hypoglycemia a week Patient does not know what the A1C means Patient is not eating snacks between meals Patient will benefit from [place] telephonic outreach for education and support for diabetes self management.  Plan:  RN sent educational material on what A1C means RN sent educational material on Eating Healthy Snacks RN sent a picture chart of hypo and hyperglycemia reactions RN sent educational material on treating high and low blood glucose RN sent picture sheet on chair exercises RN will follow up outreach in the month of October RN will send the physician a barriers letter and assessment  [place] Management [phone]

## 2016-04-09 ENCOUNTER — Encounter: Payer: Self-pay | Admitting: Family Medicine

## 2016-04-09 ENCOUNTER — Ambulatory Visit: Payer: Medicare Other | Admitting: Internal Medicine

## 2016-04-09 ENCOUNTER — Ambulatory Visit (INDEPENDENT_AMBULATORY_CARE_PROVIDER_SITE_OTHER): Payer: Medicare Other | Admitting: Family Medicine

## 2016-04-09 VITALS — BP 99/67 | HR 87 | Temp 97.9°F | Resp 94 | Wt 148.4 lb

## 2016-04-09 DIAGNOSIS — E1165 Type 2 diabetes mellitus with hyperglycemia: Secondary | ICD-10-CM | POA: Diagnosis not present

## 2016-04-09 DIAGNOSIS — I2510 Atherosclerotic heart disease of native coronary artery without angina pectoris: Secondary | ICD-10-CM | POA: Diagnosis not present

## 2016-04-09 DIAGNOSIS — N183 Chronic kidney disease, stage 3 unspecified: Secondary | ICD-10-CM

## 2016-04-09 DIAGNOSIS — E1151 Type 2 diabetes mellitus with diabetic peripheral angiopathy without gangrene: Secondary | ICD-10-CM

## 2016-04-09 DIAGNOSIS — E039 Hypothyroidism, unspecified: Secondary | ICD-10-CM

## 2016-04-09 DIAGNOSIS — E1122 Type 2 diabetes mellitus with diabetic chronic kidney disease: Secondary | ICD-10-CM | POA: Diagnosis not present

## 2016-04-09 DIAGNOSIS — IMO0002 Reserved for concepts with insufficient information to code with codable children: Secondary | ICD-10-CM

## 2016-04-09 DIAGNOSIS — I739 Peripheral vascular disease, unspecified: Secondary | ICD-10-CM

## 2016-04-09 DIAGNOSIS — E785 Hyperlipidemia, unspecified: Secondary | ICD-10-CM | POA: Diagnosis not present

## 2016-04-09 MED ORDER — ATORVASTATIN CALCIUM 40 MG PO TABS: 40.0000 mg | ORAL_TABLET | Freq: Every day | ORAL | 3 refills | Status: DC

## 2016-04-09 MED ORDER — ASPIRIN EC 81 MG PO TBEC: 81.0000 mg | DELAYED_RELEASE_TABLET | Freq: Every day | ORAL | 3 refills | Status: DC

## 2016-04-09 NOTE — Assessment & Plan Note (Signed)
Stable but patient on no secondary preventative measures. I have reviewed the chart and I am unsure why. Discussed this with patient and daughter today. They would like to proceed with my recommendations. Starting Aspirin and statin. Cannot tolerate ACE inhibitor or beta blocker as patient has had hypertension before and her pressure is low today.

## 2016-04-09 NOTE — Progress Notes (Signed)
Pre visit review using our clinic review tool, if applicable. No additional management support is needed unless otherwise documented below in the visit note. 

## 2016-04-09 NOTE — Assessment & Plan Note (Signed)
Uncontrolled and untreated. Starting statin after discussion today. Starting patient on Lipitor 40 mg daily. Lipid panel today.

## 2016-04-09 NOTE — Assessment & Plan Note (Signed)
New problem. ? Claudication (unsure from history). Given comorbidities and risk factors, patient likely has underlying peripheral artery disease. Sending to vascular for evaluation.

## 2016-04-09 NOTE — Patient Instructions (Signed)
Take the aspirin and lipitor daily.  We will call with your appt to see Vascular.  Be sure that she sees the eye doctor and the diabetes doctor.  Follow up in 3 months.

## 2016-04-09 NOTE — Assessment & Plan Note (Signed)
Uncontrolled. Advised the daughter that she needs close follow-up with endocrinology. Attempted to obtain a urine microalbumin the patient was unable to urinate. Patient declined pneumococcal vaccine. Foot exam performed today. Patient has evidence of neuropathy. Patient likely has at least some element of peripheral vascular disease as well.

## 2016-04-09 NOTE — Progress Notes (Signed)
Subjective:  Patient ID: [name] [name], female    DOB: 08-09-1931  Age: 82 y.o. MRN: [id number]  CC: Follow up  HPI:  81 year old female with CAD s/p MI, DM-2 uncontrolled, CKD, HLD presents for follow up.  CAD s/p MI  Patient accompanied by her daughter today.  Patient is on no medications for secondary prevention.  We'll discuss with daughter today.  Patient is currently stable. No chest pain, SOB.  DM-2  Uncontrolled.  Fasting this morning was in the 260s.  She is currently on metformin, Lantus, and Humalog.  [name] not seen her endocrinologist since March.  In need of A1C.  HLD  Uncontrolled.  Last lipid panel was in 2015 - LDL was 165.  She is on no treatment. Once again (as documented previously) I am unsure why. Will discuss with daughter and patient today.   Social Hx   Social History   Social History  . Marital status: Single    Spouse name: N/A  . Number of children: N/A  . Years of education: N/A   Social History Main Topics  . Smoking status: Never Smoker  . Smokeless tobacco: Never Used  . Alcohol use No  . Drug use: No  . Sexual activity: Not Asked   Other Topics Concern  . None   Social History Narrative  . None    Review of Systems  Constitutional: Negative.   Respiratory: Negative.   Cardiovascular: Negative.   Musculoskeletal:       Pain in calf/lower leg with exertion. ? Claudication.   Objective:  BP 99/67 (BP Location: Left Arm, Patient Position: Sitting, Cuff Size: Normal)   Pulse 87   Temp 97.9 F (36.6 C) (Oral)   Resp (!) 94   Wt 148 lb 6 oz (67.3 kg)   BMI 23.95 kg/m   BP/Weight 04/09/2016 03/20/2016 6/38/7564  Systolic BP 99 332 951  Diastolic BP 67 64 93  Wt. (Lbs) 148.38 - 152.9  BMI 23.95 - 24.68    Physical Exam  Constitutional: She appears well-developed. No distress.  Cardiovascular: Normal rate and regular rhythm.   Pulmonary/Chest: Effort normal. [name] no wheezes. [name] no rales.    Abdominal: Soft. She exhibits no distension. There is no tenderness. There is no rebound and no guarding.  Neurological: She is alert.  Psychiatric: [name] a normal mood and affect.  Vitals reviewed.   Lab Results  Component Value Date   WBC 6.1 03/11/2016   HGB 13.2 03/11/2016   HCT 39.3 03/11/2016   PLT 281 03/11/2016   GLUCOSE 313 (H) 03/11/2016   CHOL 307 (H) 12/30/2013   TRIG 92.0 12/30/2013   HDL 123.40 12/30/2013   LDLDIRECT 138.1 06/20/2013   LDLCALC 165 (H) 12/30/2013   ALT 12 01/04/2016   AST 14 01/04/2016   NA 131 (L) 03/11/2016   K 4.2 03/11/2016   CL 97 (L) 03/11/2016   CREATININE 1.41 (H) 03/11/2016   BUN 29 (H) 03/11/2016   CO2 22 03/11/2016   TSH 7.77 (H) 01/04/2016   HGBA1C 9.1 (H) 01/04/2016   MICROALBUR 1.7 09/27/2013    Assessment & Plan:   Problem List Items Addressed This Visit    CAD, NATIVE VESSEL - Primary    Stable but patient on no secondary preventative measures. I have reviewed the chart and I am unsure why. Discussed this with patient and daughter today. They would like to proceed with my recommendations. Starting Aspirin and statin. Cannot tolerate  ACE inhibitor or beta blocker as patient [name] had hypertension before and her pressure is low today.      Relevant Medications   aspirin EC 81 MG tablet   atorvastatin (LIPITOR) 40 MG tablet   DM (diabetes mellitus) type II uncontrolled, periph vascular disorder (HCC)    Uncontrolled. Advised the daughter that she needs close follow-up with endocrinology. Attempted to obtain a urine microalbumin the patient was unable to urinate. Patient declined pneumococcal vaccine. Foot exam performed today. Patient [name] evidence of neuropathy. Patient likely [name] at least some element of peripheral vascular disease as well.      Relevant Medications   aspirin EC 81 MG tablet   atorvastatin (LIPITOR) 40 MG tablet   Other Relevant Orders   HgB A1c   Urine Microalbumin w/creat. ratio   Hyperlipidemia  with target LDL less than 70    Uncontrolled and untreated. Starting statin after discussion today. Starting patient on Lipitor 40 mg daily. Lipid panel today.      Relevant Medications   aspirin EC 81 MG tablet   atorvastatin (LIPITOR) 40 MG tablet   Other Relevant Orders   Lipid Profile   Hypothyroidism   Relevant Orders   TSH   CKD stage 3 due to type 2 diabetes mellitus (HCC)   Relevant Medications   aspirin EC 81 MG tablet   atorvastatin (LIPITOR) 40 MG tablet   Other Relevant Orders   CBC   Comp Met (CMET)   Peripheral vascular disease ([place])    New problem. ? Claudication (unsure from history). Given comorbidities and risk factors, patient likely [name] underlying peripheral artery disease. Sending to vascular for evaluation.      Relevant Medications   aspirin EC 81 MG tablet   atorvastatin (LIPITOR) 40 MG tablet   Other Relevant Orders   Ambulatory referral to Vascular Surgery    Other Visit Diagnoses   None.     Meds ordered this encounter  Medications  . aspirin EC 81 MG tablet    Sig: Take 1 tablet (81 mg total) by mouth daily.    Dispense:  90 tablet    Refill:  3  . atorvastatin (LIPITOR) 40 MG tablet    Sig: Take 1 tablet (40 mg total) by mouth daily.    Dispense:  90 tablet    Refill:  3    Follow-up: Return in about 3 months (around 07/09/2016).  [place]

## 2016-04-10 LAB — COMPREHENSIVE METABOLIC PANEL
ALT: 22 U/L (ref 0–35)
AST: 24 U/L (ref 0–37)
Albumin: 3.8 g/dL (ref 3.5–5.2)
Alkaline Phosphatase: 127 U/L — ABNORMAL HIGH (ref 39–117)
BILIRUBIN TOTAL: 0.6 mg/dL (ref 0.2–1.2)
BUN: 21 mg/dL (ref 6–23)
CO2: 26 meq/L (ref 19–32)
Calcium: 9.4 mg/dL (ref 8.4–10.5)
Chloride: 101 mEq/L (ref 96–112)
Creatinine, Ser: 1.11 mg/dL (ref 0.40–1.20)
GFR: 50.01 mL/min — AB (ref >60.00)
GLUCOSE: 111 mg/dL — AB (ref 70–99)
Potassium: 4.2 mEq/L (ref 3.5–5.1)
Sodium: 135 mEq/L (ref 135–145)
Total Protein: 7.3 g/dL (ref 6.0–8.3)

## 2016-04-10 LAB — HEMOGLOBIN A1C: Hgb A1c MFr Bld: 10.8 % — ABNORMAL HIGH (ref 4.6–6.5)

## 2016-04-10 LAB — LIPID PANEL
CHOL/HDL RATIO: 3
Cholesterol: 343 mg/dL — ABNORMAL HIGH (ref 0–200)
HDL: 116.5 mg/dL (ref >39.00)
LDL CALC: 195 mg/dL — AB (ref 0–99)
NONHDL: 226.72
TRIGLYCERIDES: 157 mg/dL — AB (ref 0.0–149.0)
VLDL: 31.4 mg/dL (ref 0.0–40.0)

## 2016-04-10 LAB — CBC
HCT: 37.6 % (ref 36.0–46.0)
HEMOGLOBIN: 12.7 g/dL (ref 12.0–15.0)
MCHC: 33.7 g/dL (ref 30.0–36.0)
MCV: 95.5 fl (ref 78.0–100.0)
Platelets: 288 10*3/uL (ref 150.0–400.0)
RBC: 3.93 Mil/uL (ref 3.87–5.11)
RDW: 14.6 % (ref 11.5–15.5)
WBC: 6.6 10*3/uL (ref 4.0–10.5)

## 2016-04-10 LAB — TSH: TSH: 7.11 u[IU]/mL — AB (ref 0.35–4.50)

## 2016-04-11 ENCOUNTER — Other Ambulatory Visit: Payer: Self-pay | Admitting: Family Medicine

## 2016-04-11 ENCOUNTER — Other Ambulatory Visit: Payer: Medicare Other

## 2016-04-11 DIAGNOSIS — E1151 Type 2 diabetes mellitus with diabetic peripheral angiopathy without gangrene: Secondary | ICD-10-CM | POA: Diagnosis not present

## 2016-04-11 DIAGNOSIS — E1165 Type 2 diabetes mellitus with hyperglycemia: Secondary | ICD-10-CM | POA: Diagnosis not present

## 2016-04-11 DIAGNOSIS — IMO0002 Reserved for concepts with insufficient information to code with codable children: Secondary | ICD-10-CM

## 2016-04-11 DIAGNOSIS — E039 Hypothyroidism, unspecified: Secondary | ICD-10-CM

## 2016-04-11 LAB — MICROALBUMIN / CREATININE URINE RATIO
CREATININE, U: 198.3 mg/dL
MICROALB UR: 1.3 mg/dL (ref 0.0–1.9)
Microalb Creat Ratio: 0.7 mg/g (ref 0.0–30.0)

## 2016-04-11 MED ORDER — LEVOTHYROXINE SODIUM 100 MCG PO TABS: 100.0000 ug | ORAL_TABLET | Freq: Every day | ORAL | 0 refills | Status: DC

## 2016-04-15 ENCOUNTER — Ambulatory Visit: Payer: Self-pay | Admitting: *Deleted

## 2016-04-30 ENCOUNTER — Telehealth: Payer: Self-pay | Admitting: Family Medicine

## 2016-04-30 ENCOUNTER — Other Ambulatory Visit: Payer: Self-pay | Admitting: Family Medicine

## 2016-04-30 DIAGNOSIS — H9190 Unspecified hearing loss, unspecified ear: Secondary | ICD-10-CM

## 2016-04-30 NOTE — Telephone Encounter (Signed)
The patient needs an appointment with a new ENT for replacement hearing aids. Her former ENT went out of business. She gave her grandson's fiance permission to talk with the staff regarding this issue. She will be present when you call her grandson's fiance is helping with her care.

## 2016-04-30 NOTE — Telephone Encounter (Signed)
Referral place 

## 2016-04-30 NOTE — Telephone Encounter (Signed)
What ENT do you recommend and can we place referral?

## 2016-04-30 NOTE — Telephone Encounter (Signed)
Please advise, thanks.

## 2016-05-01 ENCOUNTER — Other Ambulatory Visit: Payer: Self-pay | Admitting: *Deleted

## 2016-05-01 NOTE — Patient Outreach (Signed)
[facility]) Care Management  05/01/2016  [name] [name] 1931/12/05 [id number]    RN Health Coach  attempted #1 Follow up outreach call to patient.  Patient was unavailable. HIPPA compliance voicemail message was left with return callback number.  Plan: RN will call patient again within 14 days.    [facility] [phone]

## 2016-05-06 ENCOUNTER — Encounter: Payer: Self-pay | Admitting: Family Medicine

## 2016-05-13 ENCOUNTER — Ambulatory Visit: Payer: Self-pay | Admitting: *Deleted

## 2016-05-19 LAB — HM DIABETES EYE EXAM

## 2016-05-20 ENCOUNTER — Encounter: Payer: Self-pay | Admitting: Family Medicine

## 2016-05-29 ENCOUNTER — Encounter (INDEPENDENT_AMBULATORY_CARE_PROVIDER_SITE_OTHER): Payer: Self-pay

## 2016-05-29 ENCOUNTER — Ambulatory Visit (INDEPENDENT_AMBULATORY_CARE_PROVIDER_SITE_OTHER): Payer: Self-pay | Admitting: Vascular Surgery

## 2016-05-29 ENCOUNTER — Encounter (INDEPENDENT_AMBULATORY_CARE_PROVIDER_SITE_OTHER): Payer: Medicare Other

## 2016-06-02 ENCOUNTER — Telehealth: Payer: Self-pay | Admitting: *Deleted

## 2016-06-02 NOTE — Telephone Encounter (Signed)
Pt had Insurance lapse until Dec 1, she requested Humalog samples if possible  Pt contact [phone]

## 2016-06-02 NOTE — Telephone Encounter (Signed)
Please advise 

## 2016-06-03 NOTE — Telephone Encounter (Signed)
I do not have humalog samples.

## 2016-06-03 NOTE — Telephone Encounter (Signed)
Pt needs insulin and is okay with switching to something you have samples of.

## 2016-06-03 NOTE — Telephone Encounter (Signed)
Pt had me speak to her house mate [name] who takes care of her medication. [name] was told to talk with pharmacy about switching to lanyus and a regular insulin to help financial

## 2016-06-03 NOTE — Telephone Encounter (Signed)
She should discuss with pharmacy. She is on lantus and novolog. She can consider switch to lantus and regular insulin.

## 2016-07-08 ENCOUNTER — Other Ambulatory Visit: Payer: Self-pay | Admitting: Family Medicine

## 2016-07-08 DIAGNOSIS — E039 Hypothyroidism, unspecified: Secondary | ICD-10-CM

## 2016-07-15 ENCOUNTER — Encounter: Payer: Self-pay | Admitting: Emergency Medicine

## 2016-07-15 ENCOUNTER — Inpatient Hospital Stay
Admission: EM | Admit: 2016-07-15 | Discharge: 2016-07-17 | DRG: 066 | Disposition: A | Discharge status: Skilled Nursing Facility | Payer: Medicare Other | Attending: Internal Medicine | Admitting: Internal Medicine

## 2016-07-15 ENCOUNTER — Emergency Department: Payer: Medicare Other

## 2016-07-15 DIAGNOSIS — Z880 Allergy status to penicillin: Secondary | ICD-10-CM

## 2016-07-15 DIAGNOSIS — K589 Irritable bowel syndrome without diarrhea: Secondary | ICD-10-CM | POA: Diagnosis present

## 2016-07-15 DIAGNOSIS — I69351 Hemiplegia and hemiparesis following cerebral infarction affecting right dominant side: Secondary | ICD-10-CM

## 2016-07-15 DIAGNOSIS — I679 Cerebrovascular disease, unspecified: Secondary | ICD-10-CM | POA: Diagnosis not present

## 2016-07-15 DIAGNOSIS — I10 Essential (primary) hypertension: Secondary | ICD-10-CM | POA: Diagnosis present

## 2016-07-15 DIAGNOSIS — I639 Cerebral infarction, unspecified: Principal | ICD-10-CM | POA: Diagnosis present

## 2016-07-15 DIAGNOSIS — E039 Hypothyroidism, unspecified: Secondary | ICD-10-CM | POA: Diagnosis present

## 2016-07-15 DIAGNOSIS — E1165 Type 2 diabetes mellitus with hyperglycemia: Secondary | ICD-10-CM | POA: Diagnosis present

## 2016-07-15 DIAGNOSIS — M810 Age-related osteoporosis without current pathological fracture: Secondary | ICD-10-CM | POA: Diagnosis present

## 2016-07-15 DIAGNOSIS — M6281 Muscle weakness (generalized): Secondary | ICD-10-CM | POA: Diagnosis not present

## 2016-07-15 DIAGNOSIS — E119 Type 2 diabetes mellitus without complications: Secondary | ICD-10-CM | POA: Diagnosis not present

## 2016-07-15 DIAGNOSIS — R531 Weakness: Secondary | ICD-10-CM | POA: Diagnosis present

## 2016-07-15 DIAGNOSIS — I252 Old myocardial infarction: Secondary | ICD-10-CM | POA: Diagnosis not present

## 2016-07-15 DIAGNOSIS — Z794 Long term (current) use of insulin: Secondary | ICD-10-CM | POA: Diagnosis not present

## 2016-07-15 DIAGNOSIS — K219 Gastro-esophageal reflux disease without esophagitis: Secondary | ICD-10-CM | POA: Diagnosis not present

## 2016-07-15 DIAGNOSIS — G4733 Obstructive sleep apnea (adult) (pediatric): Secondary | ICD-10-CM | POA: Diagnosis present

## 2016-07-15 DIAGNOSIS — E785 Hyperlipidemia, unspecified: Secondary | ICD-10-CM | POA: Diagnosis not present

## 2016-07-15 DIAGNOSIS — I2510 Atherosclerotic heart disease of native coronary artery without angina pectoris: Secondary | ICD-10-CM | POA: Diagnosis present

## 2016-07-15 DIAGNOSIS — J449 Chronic obstructive pulmonary disease, unspecified: Secondary | ICD-10-CM | POA: Diagnosis present

## 2016-07-15 DIAGNOSIS — Z7982 Long term (current) use of aspirin: Secondary | ICD-10-CM

## 2016-07-15 DIAGNOSIS — I69398 Other sequelae of cerebral infarction: Secondary | ICD-10-CM | POA: Diagnosis not present

## 2016-07-15 DIAGNOSIS — R200 Anesthesia of skin: Secondary | ICD-10-CM | POA: Diagnosis not present

## 2016-07-15 DIAGNOSIS — I4891 Unspecified atrial fibrillation: Secondary | ICD-10-CM | POA: Diagnosis not present

## 2016-07-15 DIAGNOSIS — I6523 Occlusion and stenosis of bilateral carotid arteries: Secondary | ICD-10-CM | POA: Diagnosis not present

## 2016-07-15 DIAGNOSIS — Z7902 Long term (current) use of antithrombotics/antiplatelets: Secondary | ICD-10-CM | POA: Diagnosis not present

## 2016-07-15 DIAGNOSIS — R29701 NIHSS score 1: Secondary | ICD-10-CM | POA: Diagnosis present

## 2016-07-15 DIAGNOSIS — Z8542 Personal history of malignant neoplasm of other parts of uterus: Secondary | ICD-10-CM | POA: Diagnosis not present

## 2016-07-15 DIAGNOSIS — R42 Dizziness and giddiness: Secondary | ICD-10-CM | POA: Diagnosis not present

## 2016-07-15 DIAGNOSIS — Z7401 Bed confinement status: Secondary | ICD-10-CM | POA: Diagnosis not present

## 2016-07-15 LAB — PROTIME-INR
INR: 0.94
Prothrombin Time: 12.6 seconds (ref 11.4–15.2)

## 2016-07-15 LAB — CBC WITH DIFFERENTIAL/PLATELET
BASOS ABS: 0.1 10*3/uL (ref 0–0.1)
BASOS PCT: 2 %
EOS ABS: 0.3 10*3/uL (ref 0–0.7)
EOS PCT: 6 %
HCT: 33.5 % — ABNORMAL LOW (ref 35.0–47.0)
Hemoglobin: 11.7 g/dL — ABNORMAL LOW (ref 12.0–16.0)
Lymphocytes Relative: 28 %
Lymphs Abs: 1.1 10*3/uL (ref 1.0–3.6)
MCH: 33.4 pg (ref 26.0–34.0)
MCHC: 35 g/dL (ref 32.0–36.0)
MCV: 95.6 fL (ref 80.0–100.0)
MONO ABS: 0.3 10*3/uL (ref 0.2–0.9)
MONOS PCT: 6 %
Neutro Abs: 2.4 10*3/uL (ref 1.4–6.5)
Neutrophils Relative %: 58 %
PLATELETS: 243 10*3/uL (ref 150–440)
RBC: 3.51 MIL/uL — ABNORMAL LOW (ref 3.80–5.20)
RDW: 14.3 % (ref 11.5–14.5)
WBC: 4.1 10*3/uL (ref 3.6–11.0)

## 2016-07-15 LAB — LIPID PANEL
Cholesterol: 247 mg/dL — ABNORMAL HIGH (ref 0–200)
HDL: 130 mg/dL (ref >40)
LDL CALC: 103 mg/dL — AB (ref 0–99)
TRIGLYCERIDES: 71 mg/dL (ref <150)
Total CHOL/HDL Ratio: 1.9 RATIO
VLDL: 14 mg/dL (ref 0–40)

## 2016-07-15 LAB — GLUCOSE, CAPILLARY
GLUCOSE-CAPILLARY: 278 mg/dL — AB (ref 65–99)
Glucose-Capillary: 165 mg/dL — ABNORMAL HIGH (ref 65–99)

## 2016-07-15 LAB — APTT: APTT: 28 s (ref 24–36)

## 2016-07-15 LAB — COMPREHENSIVE METABOLIC PANEL
ALBUMIN: 3.9 g/dL (ref 3.5–5.0)
ALT: 45 U/L (ref 14–54)
ANION GAP: 9 (ref 5–15)
AST: 69 U/L — AB (ref 15–41)
Alkaline Phosphatase: 121 U/L (ref 38–126)
BUN: 16 mg/dL (ref 6–20)
CHLORIDE: 100 mmol/L — AB (ref 101–111)
CO2: 26 mmol/L (ref 22–32)
Calcium: 9.5 mg/dL (ref 8.9–10.3)
Creatinine, Ser: 0.92 mg/dL (ref 0.44–1.00)
GFR calc Af Amer: >60 mL/min (ref >60)
GFR, EST NON AFRICAN AMERICAN: 56 mL/min — AB (ref >60)
GLUCOSE: 174 mg/dL — AB (ref 65–99)
POTASSIUM: 4.3 mmol/L (ref 3.5–5.1)
Sodium: 135 mmol/L (ref 135–145)
Total Bilirubin: 0.5 mg/dL (ref 0.3–1.2)
Total Protein: 7.5 g/dL (ref 6.5–8.1)

## 2016-07-15 LAB — TROPONIN I

## 2016-07-15 MED ORDER — ACETAMINOPHEN 325 MG PO TABS: 650.0000 mg | ORAL_TABLET | ORAL | Status: DC | PRN

## 2016-07-15 MED ORDER — INSULIN ASPART 100 UNIT/ML ~~LOC~~ SOLN
0.0000 [IU] | Freq: Three times a day (TID) | SUBCUTANEOUS | Status: DC
  Administered 2016-07-16 – 2016-07-17 (×5): 5 [IU] via SUBCUTANEOUS
  Administered 2016-07-17: 2 [IU] via SUBCUTANEOUS
  Filled 2016-07-15 (×3): qty 5
  Filled 2016-07-15: qty 2
  Filled 2016-07-15 (×2): qty 5

## 2016-07-15 MED ORDER — ASPIRIN 325 MG PO TABS
325.0000 mg | ORAL_TABLET | Freq: Every day | ORAL | Status: DC
  Administered 2016-07-16: 325 mg via ORAL
  Filled 2016-07-15: qty 1

## 2016-07-15 MED ORDER — INSULIN ASPART 100 UNIT/ML ~~LOC~~ SOLN
0.0000 [IU] | Freq: Every day | SUBCUTANEOUS | Status: DC
  Administered 2016-07-15: 2 [IU] via SUBCUTANEOUS
  Administered 2016-07-16: 5 [IU] via SUBCUTANEOUS
  Filled 2016-07-15: qty 2
  Filled 2016-07-15: qty 5

## 2016-07-15 MED ORDER — ASPIRIN 81 MG PO CHEW
324.0000 mg | CHEWABLE_TABLET | Freq: Once | ORAL | Status: AC
  Administered 2016-07-15: 324 mg via ORAL
  Filled 2016-07-15: qty 4

## 2016-07-15 MED ORDER — STROKE: EARLY STAGES OF RECOVERY BOOK
Freq: Once | Status: AC
  Administered 2016-07-15: 19:00:00

## 2016-07-15 MED ORDER — ACETAMINOPHEN 160 MG/5ML PO SOLN: 650.0000 mg | ORAL | Status: DC | PRN

## 2016-07-15 MED ORDER — ACETAMINOPHEN 650 MG RE SUPP: 650.0000 mg | RECTAL | Status: DC | PRN

## 2016-07-15 MED ORDER — INSULIN ASPART 100 UNIT/ML ~~LOC~~ SOLN
0.0000 [IU] | Freq: Three times a day (TID) | SUBCUTANEOUS | Status: DC
  Administered 2016-07-15: 3 [IU] via SUBCUTANEOUS
  Filled 2016-07-15: qty 3

## 2016-07-15 MED ORDER — ENOXAPARIN SODIUM 40 MG/0.4ML ~~LOC~~ SOLN
40.0000 mg | SUBCUTANEOUS | Status: DC
  Administered 2016-07-15 – 2016-07-16 (×2): 40 mg via SUBCUTANEOUS
  Filled 2016-07-15 (×2): qty 0.4

## 2016-07-15 MED ORDER — ASPIRIN 300 MG RE SUPP: 300.0000 mg | Freq: Every day | RECTAL | Status: DC

## 2016-07-15 NOTE — ED Notes (Signed)
This RN attempted to call MRI 3 times and on two different numbers with no answer. Message left on answering machine. Will continue to call.

## 2016-07-15 NOTE — ED Notes (Signed)
Pt requested food, pt has passed swallow screen (see chart). Pt currently eating sandwich tray without difficulty.

## 2016-07-15 NOTE — ED Provider Notes (Signed)
[facility] Emergency Department Provider Note   ____________________________________________   First MD Initiated Contact with Patient 07/15/16 1524     (approximate)  I have reviewed the triage vital signs and the nursing notes.   HISTORY  Chief Complaint Numbness    HPI [name] [name] is a 82 y.o. female here for evaluation of weakness.  Last night patient reports when she was getting ready to go to bed evening she began feeling like something was a little wrong with her right hand and arm. When she woke up this morning she felt her symptoms were worse and she was not able to lift the arm normally and can't pick up a fork. She does have tingling in both feet, but reports this is chronic. She has some lites tingling sensation in the right arm. Has not had any trouble speaking or swallowing. But she continues to have persistent feeling of weakness and difficulty using the right hand  She takes a baby aspirin daily. She denies any history of stroke.  Past Medical History:  Diagnosis Date  . Anxiety state, unspecified   . CAD (coronary artery disease)   . Cancer [facility])    2004 Right breast, found on mammogram, radiation therapy, Dr. Bryson Ha, uterine cancer,   . COPD (chronic obstructive pulmonary disease) ([place])   . Diabetes mellitus   . Esophageal reflux   . Heart burn   . Hyperlipidemia   . IBS (irritable bowel syndrome)   . Myocardial infarction    Cath negative except for 40% occlusion LAD.  Pt not candidate for betablocker or ACEI because of hypotension  . OSA (obstructive sleep apnea)   . Osteoporosis 02/21/09   DEXA scan showed osteoporosis with left femur T-score -2.8.  Marland Kitchen Presbyacusis   . Thyroid disease    Hypothyroid  . Vitamin D deficiency     Patient Active Problem List   Diagnosis Date Noted  . Peripheral vascular disease ([place]) 04/09/2016  . Elderly person living alone 01/04/2016  . Long term current use of insulin ([place])  03/08/2014  . CKD stage 3 due to type 2 diabetes mellitus ([place]) 02/22/2014  . Hypothyroidism 09/23/2012  . GERD (gastroesophageal reflux disease) 09/23/2012  . Anxiety 07/26/2012  . Hyperlipidemia with target LDL less than 70 12/03/2011  . DM (diabetes mellitus) type II uncontrolled, periph vascular disorder ([place]) 04/03/2011  . CAD, NATIVE VESSEL 12/13/2009    Past Surgical History:  Procedure Laterality Date  . ABDOMINAL HYSTERECTOMY     uterine cancer  . BREAST SURGERY    . COLON SURGERY  2013   done at [facility]    Prior to Admission medications   Medication Sig Start Date End Date Taking? Authorizing Provider  aspirin EC 81 MG tablet Take 1 tablet (81 mg total) by mouth daily. 04/09/16   [name], DO  atorvastatin (LIPITOR) 40 MG tablet Take 1 tablet (40 mg total) by mouth daily. 04/09/16   Jayce G Cook, DO  fluticasone (FLONASE) 50 MCG/ACT nasal spray Place into both nostrils daily.    Historical Provider, MD  insulin lispro (HUMALOG) 100 UNIT/ML KiwkPen Inject 0.08 mLs (8 Units total) into the skin 3 (three) times daily. 10/10/15 10/09/16  Jackolyn Confer, MD  LANTUS SOLOSTAR 100 UNIT/ML Solostar Pen INJECT 24 UNITS SUBCUTANEOUSLY NIGHTLY. 10/30/15   Historical Provider, MD  levothyroxine (SYNTHROID, LEVOTHROID) 100 MCG tablet TAKE 1 TABLET(100 MCG) BY MOUTH DAILY 07/08/16   [name], DO  metFORMIN (GLUCOPHAGE) 500  MG tablet Take 1 tablet (500 mg total) by mouth 2 (two) times daily with a meal. 08/03/15   Jackolyn Confer, MD  pantoprazole (PROTONIX) 40 MG tablet Take 1 tablet (40 mg total) by mouth 2 (two) times daily. 01/04/16   Jackolyn Confer, MD    Allergies Penicillins  Family History  Problem Relation Age of Onset  . Diabetes Mother   . Heart attack Father     Social History Social History  Substance Use Topics  . Smoking status: Never Smoker  . Smokeless tobacco: Never Used  . Alcohol use No    Review of Systems Constitutional: No fever/chills Eyes: No  visual changes. ENT: No sore throat. Cardiovascular: Denies chest pain. Respiratory: Denies shortness of breath. Gastrointestinal: No abdominal pain.  No nausea, no vomiting.  No diarrhea.  No constipation. Genitourinary: Negative for dysuria. Musculoskeletal: Negative for back pain. Skin: Negative for rash. Neurological: Negative for headaches.  10-point ROS otherwise negative.  ____________________________________________   PHYSICAL EXAM:  VITAL SIGNS: ED Triage Vitals [07/15/16 1217]  Enc Vitals Group     BP 129/64     Pulse Rate 84     Resp 18     Temp 98 F (36.7 C)     Temp Source Oral     SpO2 95 %     Weight 150 lb (68 kg)     Height 5\' 6"  (1.676 m)     Head Circumference      Peak Flow      Pain Score      Pain Loc      Pain Edu?      Excl. in [place]?     Constitutional: Alert and oriented. Well appearing and in no acute distress. Eyes: Conjunctivae are normal. PERRL. EOMI. Head: Atraumatic. Nose: No congestion/rhinnorhea. Mouth/Throat: Mucous membranes are moist.  Oropharynx non-erythematous. Neck: No stridor.   Cardiovascular: Normal rate, regular rhythm. Grossly normal heart sounds.  Good peripheral circulation. Respiratory: Normal respiratory effort.  No retractions. Lungs CTAB. Gastrointestinal: Soft and nontender. No distention. Musculoskeletal: No lower extremity tenderness nor edema.  No joint effusions. Neurologic:  Normal speech and language.  NIH score equals 3, performed by me at bedside. The patient has no pronator drift. The patient has normal cranial nerve exam. Extraocular movements are normal. Visual fields are normal. Patient has 5 out of 5 strength in all extremities except poor coordination and weakness in the right upper arm about 4 out of 5 compared to the left. There is no numbness or gross, acute sensory abnormality in the extremities bilaterally except for some slight change in sensation over the right. No speech disturbance. No  dysarthria. No aphasia. No ataxia except some slight difficulty using the right hand with fine motor movement. Normal finger nose finger bilat. Patient speaking in full and clear sentences.   Skin:  Skin is warm, dry and intact. No rash noted. Psychiatric: Mood and affect are normal. Speech and behavior are normal.  ____________________________________________   LABS (all labs ordered are listed, but only abnormal results are displayed)  Labs Reviewed  CBC WITH DIFFERENTIAL/PLATELET - Abnormal; Notable for the following:       Result Value   RBC 3.51 (*)    Hemoglobin 11.7 (*)    HCT 33.5 (*)    All other components within normal limits  COMPREHENSIVE METABOLIC PANEL - Abnormal; Notable for the following:    Chloride 100 (*)    Glucose, Bld 174 (*)  AST 69 (*)    GFR calc non Af Amer 56 (*)    All other components within normal limits  TROPONIN I  URINALYSIS, COMPLETE (UACMP) WITH MICROSCOPIC  PROTIME-INR  APTT   ____________________________________________  EKG  ED ECG REPORT I, Maritssa Haughton, the attending physician, personally viewed and interpreted this ECG.  Date: 07/15/2016 EKG Time: 1235 Rate: 80 Rhythm: normal sinus rhythm QRS Axis: normal Intervals: normal ST/T Wave abnormalities: normal Conduction Disturbances: none Narrative Interpretation: unremarkable  ____________________________________________  RADIOLOGY  Ct Head Wo Contrast  Result Date: 07/15/2016 CLINICAL DATA:  Right arm numbness. EXAM: CT HEAD WITHOUT CONTRAST TECHNIQUE: Contiguous axial images were obtained from the base of the skull through the vertex without intravenous contrast. COMPARISON:  CT head 06/28/2011 FINDINGS: Brain: Moderate atrophy with progression. Ventricular enlargement with progression and felt to be due to atrophy. Extensive white matter hypodensity diffusely with progression since the prior study. No underlying mass or hemorrhage. No acute cortical infarction. No  shift of the midline structures. Vascular: No hyperdense vessel or unexpected calcification. Skull: Negative Sinuses/Orbits: Prior sinus surgery. Paranasal sinuses clear. Bilateral lens replacement. No orbital lesion. Other: None IMPRESSION: Progression of atrophy. Progression of white matter hypodensity diffusely most likely due to chronic microvascular ischemia. No acute abnormality. Electronically Signed   By: Franchot Gallo M.D.   On: 07/15/2016 13:08    ____________________________________________   PROCEDURES  Procedure(s) performed: None  Procedures  Critical Care performed: Yes, see critical care note(s)  CRITICAL CARE Performed by: Delman Kitten   Total critical care time: 35 minutes  Critical care time was exclusive of separately billable procedures and treating other patients.  Critical care was necessary to treat or prevent imminent or life-threatening deterioration.  Critical care was time spent personally by me on the following activities: development of treatment plan with patient and/or surrogate as well as nursing, discussions with consultants, evaluation of patient's response to treatment, examination of patient, obtaining history from patient or surrogate, ordering and performing treatments and interventions, ordering and review of laboratory studies, ordering and review of radiographic studies, pulse oximetry and re-evaluation of patient's condition.  Patient presents for evaluation of an acute neurologic deficit. Patient seen and evaluated by me, she does have an acute neurologic deficit that primarily involves the right upper extremity. I am highly suspicious she has had an ischemic stroke. The patient was evaluated for inclusion/exclusion criteria for TPA, but notably her symptoms started last night when she went to bed and were noted again when she woke up this morning. She is beyond the foreign half hour window for TPA, and I discussed with Dr. Doy Mince who recommends  aspirin and admission for a stroke workup. ____________________________________________   INITIAL IMPRESSION / [place] / ED COURSE  Pertinent labs & imaging results that were available during my care of the patient were reviewed by me and considered in my medical decision making (see chart for details).  Patient presents with acute deficit involving the right upper extremity. Outside the TPA window. Symptoms started last night. Discussed with Dr. Doy Mince, we will admit her. My working diagnosis at this time is likely a small ischemic stroke.  No acute cardiac or pulmonary symptoms. No vascular deficits.  Clinical Course      ____________________________________________   FINAL CLINICAL IMPRESSION(S) / ED DIAGNOSES  Final diagnoses:  Muscle right arm weakness  Ischemic stroke (HCC)      NEW MEDICATIONS STARTED DURING THIS VISIT:  New Prescriptions   No medications on  file     Note:  This document was prepared using Dragon voice recognition software and may include unintentional dictation errors.     Delman Kitten, MD 07/15/16 [phone]

## 2016-07-15 NOTE — H&P (Signed)
[facility] NAME: [name] [name]    MR#:  [id number]  DATE OF BIRTH:  [birth year]  DATE OF ADMISSION:  07/15/2016  PRIMARY CARE PHYSICIAN: [name], DO   REQUESTING/REFERRING PHYSICIAN: Dr. [name]  CHIEF COMPLAINT:  Right upper and lower extremity weakness since yesterday  HISTORY OF PRESENT ILLNESS:  [name] [name]  is a 82 y.o. female with a known history of Hypertension, CAD, diabetes comes to the emergency room accompanied by her daughter with complaints of right upper extremity weakness numbness and heaviness since yesterday. Patient noticed symptoms before going to bed yesterday woke up feeling the same and started having weakness in her right lower extremity as well. Denies any difficulty swallowing vision problem or any symptoms on the left side. Given her risk factors for stroke she's being evaluated for possible acute CVA. CT head negative for CVA. She received at 324 mg of aspirin in the emergency room. Patient otherwise is hemodynamically stable.  PAST MEDICAL HISTORY:   Past Medical History:  Diagnosis Date  . Anxiety state, unspecified   . CAD (coronary artery disease)   . Cancer [facility])    2004 Right breast, found on mammogram, radiation therapy, Dr. Bryson Ha, uterine cancer,   . COPD (chronic obstructive pulmonary disease) ([facility])   . Diabetes mellitus   . Esophageal reflux   . Heart burn   . Hyperlipidemia   . IBS (irritable bowel syndrome)   . Myocardial infarction    Cath negative except for 40% occlusion LAD.  Pt not candidate for betablocker or ACEI because of hypotension  . OSA (obstructive sleep apnea)   . Osteoporosis 02/21/09   DEXA scan showed osteoporosis with left femur T-score -2.8.  Marland Kitchen Presbyacusis   . Thyroid disease    Hypothyroid  . Vitamin D deficiency     PAST SURGICAL HISTOIRY:   Past Surgical History:  Procedure Laterality Date  . ABDOMINAL HYSTERECTOMY     uterine cancer  .  BREAST SURGERY    . COLON SURGERY  2013   done at [facility]:   Social History  Substance Use Topics  . Smoking status: Never Smoker  . Smokeless tobacco: Never Used  . Alcohol use No    FAMILY HISTORY:   Family History  Problem Relation Age of Onset  . Diabetes Mother   . Heart attack Father     DRUG ALLERGIES:   Allergies  Allergen Reactions  . Penicillins Other (See Comments)    Reaction:  Unknown    REVIEW OF SYSTEMS:  Review of Systems  Constitutional: Negative for chills, fever and weight loss.  HENT: Negative for ear discharge, ear pain and nosebleeds.   Eyes: Negative for blurred vision, pain and discharge.  Respiratory: Negative for sputum production, shortness of breath, wheezing and stridor.   Cardiovascular: Negative for chest pain, palpitations, orthopnea and PND.  Gastrointestinal: Negative for abdominal pain, diarrhea, nausea and vomiting.  Genitourinary: Negative for frequency and urgency.  Musculoskeletal: Negative for back pain and joint pain.  Neurological: Positive for focal weakness and weakness. Negative for sensory change and speech change.  Psychiatric/Behavioral: Negative for depression and hallucinations. The patient is not nervous/anxious.      MEDICATIONS AT HOME:   Prior to Admission medications   Medication Sig Start Date End Date Taking? Authorizing Provider  aspirin EC 81 MG tablet Take 1 tablet (81 mg total) by mouth daily. 04/09/16   Barnie Del  Cook, DO  atorvastatin (LIPITOR) 40 MG tablet Take 1 tablet (40 mg total) by mouth daily. 04/09/16   Jayce G Cook, DO  fluticasone (FLONASE) 50 MCG/ACT nasal spray Place into both nostrils daily.    Historical Provider, MD  insulin lispro (HUMALOG) 100 UNIT/ML KiwkPen Inject 0.08 mLs (8 Units total) into the skin 3 (three) times daily. 10/10/15 10/09/16  Jackolyn Confer, MD  LANTUS SOLOSTAR 100 UNIT/ML Solostar Pen INJECT 24 UNITS SUBCUTANEOUSLY NIGHTLY. 10/30/15   Historical Provider,  MD  levothyroxine (SYNTHROID, LEVOTHROID) 100 MCG tablet TAKE 1 TABLET(100 MCG) BY MOUTH DAILY 07/08/16   [name], DO  metFORMIN (GLUCOPHAGE) 500 MG tablet Take 1 tablet (500 mg total) by mouth 2 (two) times daily with a meal. 08/03/15   Jackolyn Confer, MD  pantoprazole (PROTONIX) 40 MG tablet Take 1 tablet (40 mg total) by mouth 2 (two) times daily. 01/04/16   Jackolyn Confer, MD      VITAL SIGNS:  Blood pressure 118/71, pulse 69, temperature 98 F (36.7 C), temperature source Oral, resp. rate 11, height 5\' 6"  (1.676 m), weight 68 kg (150 lb), SpO2 95 %.  PHYSICAL EXAMINATION:  GENERAL:  82 y.o.-year-old patient lying in the bed with no acute distress.  EYES: Pupils equal, round, reactive to light and accommodation. No scleral icterus. Extraocular muscles intact.  HEENT: Head atraumatic, normocephalic. Oropharynx and nasopharynx clear.  NECK:  Supple, no jugular venous distention. No thyroid enlargement, no tenderness.  LUNGS: Normal breath sounds bilaterally, no wheezing, rales,rhonchi or crepitation. No use of accessory muscles of respiration.  CARDIOVASCULAR: S1, S2 normal. No murmurs, rubs, or gallops.  ABDOMEN: Soft, nontender, nondistended. Bowel sounds present. No organomegaly or mass.  EXTREMITIES: No pedal edema, cyanosis, or clubbing.  NEUROLOGIC: Cranial nerves II through XII are intact. Muscle strength 5/5 in all extremities.Patient has right upper extremity weakness 4+ over 5. Decreased sensation on the right than the left  Gait not checked.  PSYCHIATRIC: The patient is alert and oriented x 3.  SKIN: No obvious rash, lesion, or ulcer.   LABORATORY PANEL:   CBC  Recent Labs Lab 07/15/16 1228  WBC 4.1  HGB 11.7*  HCT 33.5*  PLT 243   ------------------------------------------------------------------------------------------------------------------  Chemistries   Recent Labs Lab 07/15/16 1228  NA 135  K 4.3  CL 100*  CO2 26  GLUCOSE 174*  BUN 16   CREATININE 0.92  CALCIUM 9.5  AST 69*  ALT 45  ALKPHOS 121  BILITOT 0.5   ------------------------------------------------------------------------------------------------------------------  Cardiac Enzymes  Recent Labs Lab 07/15/16 1228  TROPONINI <0.03   ------------------------------------------------------------------------------------------------------------------  RADIOLOGY:  Ct Head Wo Contrast  Result Date: 07/15/2016 CLINICAL DATA:  Right arm numbness. EXAM: CT HEAD WITHOUT CONTRAST TECHNIQUE: Contiguous axial images were obtained from the base of the skull through the vertex without intravenous contrast. COMPARISON:  CT head 06/28/2011 FINDINGS: Brain: Moderate atrophy with progression. Ventricular enlargement with progression and felt to be due to atrophy. Extensive white matter hypodensity diffusely with progression since the prior study. No underlying mass or hemorrhage. No acute cortical infarction. No shift of the midline structures. Vascular: No hyperdense vessel or unexpected calcification. Skull: Negative Sinuses/Orbits: Prior sinus surgery. Paranasal sinuses clear. Bilateral lens replacement. No orbital lesion. Other: None IMPRESSION: Progression of atrophy. Progression of white matter hypodensity diffusely most likely due to chronic microvascular ischemia. No acute abnormality. Electronically Signed   By: Franchot Gallo M.D.   On: 07/15/2016 13:08    EKG:  Normal sinus  rhythm  IMPRESSION AND PLAN:   [name] [name]  is a 82 y.o. female with a known history of Hypertension, CAD, diabetes comes to the emergency room accompanied by her daughter with complaints of right upper extremity weakness numbness and heaviness since yesterday. Patient noticed symptoms before going to bed yesterday woke up feeling the same and started having weakness in her right lower extremity as well. Denies any difficulty swallowing vision problem or any symptoms on the left side. Given her  risk factors for stroke she's being evaluated for possible acute CVA.  1. Right-sided weakness upper more than lower acute onset -Patient is being admitted for possible acute stroke -CT head negative -Continue aspirin for neurology recommendation -Get MRI/MRA, ultrasound carotid Doppler, echo -Physical therapy, occupational therapy, speech therapy  2. Hypertension continue home meds  3. Uncontrolled type 2 diabetes -Continue Lantus and oral meds -Sliding-scale insulin  4. DVT prophylaxis subcutaneous Lovenox  5. Hyperlipidemia continue statins  Was discussed with patient and patient's daughter was present to ER.  All the records are reviewed and case discussed with ED provider. Management plans discussed with the patient, family and they are in agreement.  CODE STATUS: Full  TOTAL TIME TAKING CARE OF THIS PATIENT: 50 minutes.    Daved Mcfann M.D on 07/15/2016 at 4:12 PM  Between 7am to 6pm - Pager - [phone]  After 6pm go to [email or web address] - password [email or web address] [facility]  Office  [phone]  CC: Primary care physician; [name], DO

## 2016-07-15 NOTE — ED Triage Notes (Signed)
Brought in via ems from home  Developed numbness to feet last pm.. Then states her right arm feels heavy   Swimmy headed  Denies any pain  FSBS 193

## 2016-07-16 ENCOUNTER — Inpatient Hospital Stay: Payer: Medicare Other

## 2016-07-16 ENCOUNTER — Ambulatory Visit (INDEPENDENT_AMBULATORY_CARE_PROVIDER_SITE_OTHER): Payer: Self-pay | Admitting: Vascular Surgery

## 2016-07-16 ENCOUNTER — Inpatient Hospital Stay
Admit: 2016-07-16 | Discharge: 2016-07-16 | Disposition: A | Discharge status: Home / Self Care | Payer: Medicare Other | Attending: Internal Medicine | Admitting: Internal Medicine

## 2016-07-16 ENCOUNTER — Encounter (INDEPENDENT_AMBULATORY_CARE_PROVIDER_SITE_OTHER): Payer: Medicare Other

## 2016-07-16 ENCOUNTER — Encounter (INDEPENDENT_AMBULATORY_CARE_PROVIDER_SITE_OTHER): Payer: Self-pay

## 2016-07-16 DIAGNOSIS — I639 Cerebral infarction, unspecified: Principal | ICD-10-CM

## 2016-07-16 LAB — URINALYSIS, COMPLETE (UACMP) WITH MICROSCOPIC
BILIRUBIN URINE: NEGATIVE
Glucose, UA: >=500 mg/dL — AB
Hgb urine dipstick: NEGATIVE
Ketones, ur: NEGATIVE mg/dL
NITRITE: NEGATIVE
Protein, ur: NEGATIVE mg/dL
SPECIFIC GRAVITY, URINE: 1.002 — AB (ref 1.005–1.030)
pH: 6 (ref 5.0–8.0)

## 2016-07-16 LAB — LIPID PANEL
CHOL/HDL RATIO: 2 ratio
Cholesterol: 234 mg/dL — ABNORMAL HIGH (ref 0–200)
HDL: 116 mg/dL (ref >40)
LDL Cholesterol: 96 mg/dL (ref 0–99)
Triglycerides: 111 mg/dL (ref <150)
VLDL: 22 mg/dL (ref 0–40)

## 2016-07-16 LAB — GLUCOSE, CAPILLARY
GLUCOSE-CAPILLARY: 287 mg/dL — AB (ref 65–99)
GLUCOSE-CAPILLARY: 289 mg/dL — AB (ref 65–99)
Glucose-Capillary: 284 mg/dL — ABNORMAL HIGH (ref 65–99)
Glucose-Capillary: 405 mg/dL — ABNORMAL HIGH (ref 65–99)
Glucose-Capillary: 396 mg/dL — ABNORMAL HIGH (ref 65–99)

## 2016-07-16 MED ORDER — CLOPIDOGREL BISULFATE 75 MG PO TABS
75.0000 mg | ORAL_TABLET | Freq: Every day | ORAL | Status: DC
  Administered 2016-07-16 – 2016-07-17 (×2): 75 mg via ORAL
  Filled 2016-07-16 (×2): qty 1

## 2016-07-16 MED ORDER — PANTOPRAZOLE SODIUM 40 MG PO TBEC
40.0000 mg | DELAYED_RELEASE_TABLET | Freq: Two times a day (BID) | ORAL | Status: DC
  Administered 2016-07-16 – 2016-07-17 (×3): 40 mg via ORAL
  Filled 2016-07-16 (×3): qty 1

## 2016-07-16 MED ORDER — LEVOTHYROXINE SODIUM 100 MCG PO TABS
100.0000 ug | ORAL_TABLET | Freq: Every day | ORAL | Status: DC
  Administered 2016-07-17: 05:00:00 100 ug via ORAL
  Filled 2016-07-16: qty 1

## 2016-07-16 MED ORDER — METFORMIN HCL 500 MG PO TABS
500.0000 mg | ORAL_TABLET | Freq: Two times a day (BID) | ORAL | Status: DC
  Administered 2016-07-16 – 2016-07-17 (×3): 500 mg via ORAL
  Filled 2016-07-16 (×4): qty 1

## 2016-07-16 MED ORDER — INSULIN GLARGINE 100 UNIT/ML ~~LOC~~ SOLN
24.0000 [IU] | Freq: Every day | SUBCUTANEOUS | Status: DC
  Administered 2016-07-16: 24 [IU] via SUBCUTANEOUS
  Filled 2016-07-16 (×2): qty 0.24

## 2016-07-16 MED ORDER — ATORVASTATIN CALCIUM 20 MG PO TABS
40.0000 mg | ORAL_TABLET | Freq: Every day | ORAL | Status: DC
  Administered 2016-07-16 – 2016-07-17 (×2): 40 mg via ORAL
  Filled 2016-07-16 (×2): qty 2

## 2016-07-16 MED ORDER — ASPIRIN EC 81 MG PO TBEC
81.0000 mg | DELAYED_RELEASE_TABLET | Freq: Every day | ORAL | Status: DC
  Administered 2016-07-17: 11:00:00 81 mg via ORAL
  Filled 2016-07-16: qty 1

## 2016-07-16 NOTE — Progress Notes (Addendum)
BS 405.  Call out to [facility] for orders. Dorna Bloom RN Received call from Dr Ara Kussmaul and to give 5 units coverage and repeat BS in an hour. Dorna Bloom RN

## 2016-07-16 NOTE — Consult Note (Signed)
Referring Physician: Tressia Miners    Chief Complaint: Confusion  HPI: [name] [name] is an 82 y.o. female with multiple medical problems who reports some time Monday afternoon started to have difficulty controlling her right arm.  The next day her right arm difficulties continued and she noted weakness in her right leg as well.  Her daughter brought her to the ED for evaluation.  Initial NIHSS of 1.    Date last known well: Date: 07/14/2016 Time last known well: Unable to determine tPA Given: No: Outside time window  Past Medical History:  Diagnosis Date  . Anxiety state, unspecified   . CAD (coronary artery disease)   . Cancer [facility])    2004 Right breast, found on mammogram, radiation therapy, Dr. Bryson Ha, uterine cancer,   . COPD (chronic obstructive pulmonary disease) ([place])   . Diabetes mellitus   . Esophageal reflux   . Heart burn   . Hyperlipidemia   . IBS (irritable bowel syndrome)   . Myocardial infarction    Cath negative except for 40% occlusion LAD.  Pt not candidate for betablocker or ACEI because of hypotension  . OSA (obstructive sleep apnea)   . Osteoporosis 02/21/09   DEXA scan showed osteoporosis with left femur T-score -2.8.  Marland Kitchen Presbyacusis   . Thyroid disease    Hypothyroid  . Vitamin D deficiency     Past Surgical History:  Procedure Laterality Date  . ABDOMINAL HYSTERECTOMY     uterine cancer  . BREAST SURGERY    . COLON SURGERY  2013   done at [facility]    Family History  Problem Relation Age of Onset  . Diabetes Mother   . Heart attack Father    Social History:  reports that she has never smoked. She has never used smokeless tobacco. She reports that she does not drink alcohol or use drugs.  Allergies:  Allergies  Allergen Reactions  . Penicillins Rash    Has patient had a PCN reaction causing immediate rash, facial/tongue/throat swelling, SOB or lightheadedness with hypotension: No Has patient had a PCN reaction causing severe rash involving mucus  membranes or skin necrosis: No Has patient had a PCN reaction that required hospitalization No Has patient had a PCN reaction occurring within the last 10 years: No If all of the above answers are "NO", then may proceed with Cephalosporin use.     Medications:  I have reviewed the patient's current medications. Prior to Admission:  Prescriptions Prior to Admission  Medication Sig Dispense Refill Last Dose  . aspirin EC 81 MG tablet Take 1 tablet (81 mg total) by mouth daily. 90 tablet 3 07/14/2016 at Unknown time  . atorvastatin (LIPITOR) 40 MG tablet Take 1 tablet (40 mg total) by mouth daily. 90 tablet 3 07/14/2016 at Unknown time  . fluticasone (FLONASE) 50 MCG/ACT nasal spray Place 2 sprays into both nostrils daily.    07/14/2016 at Unknown time  . insulin lispro (HUMALOG) 100 UNIT/ML KiwkPen Inject 0.08 mLs (8 Units total) into the skin 3 (three) times daily. (Patient taking differently: Inject 9 Units into the skin 3 (three) times daily. ) 15 mL 6 07/14/2016 at Unknown time  . LANTUS SOLOSTAR 100 UNIT/ML Solostar Pen INJECT 24 UNITS SUBCUTANEOUSLY NIGHTLY.  12 07/14/2016 at Unknown time  . levothyroxine (SYNTHROID, LEVOTHROID) 100 MCG tablet TAKE 1 TABLET(100 MCG) BY MOUTH DAILY 90 tablet 1 07/14/2016 at Unknown time  . metFORMIN (GLUCOPHAGE) 500 MG tablet Take 1 tablet (500 mg total) by mouth 2 (  two) times daily with a meal. 180 tablet 3 07/14/2016 at Unknown time  . pantoprazole (PROTONIX) 40 MG tablet Take 1 tablet (40 mg total) by mouth 2 (two) times daily. 180 tablet 3 07/14/2016 at Unknown time   Scheduled: . aspirin  300 mg Rectal Daily   Or  . aspirin  325 mg Oral Daily  . atorvastatin  40 mg Oral q1800  . enoxaparin (LOVENOX) injection  40 mg Subcutaneous Q24H  . insulin aspart  0-5 Units Subcutaneous QHS  . insulin aspart  0-9 Units Subcutaneous TID WC    ROS: History obtained from the patient  General ROS: negative for - chills, fatigue, fever, night sweats, weight  gain or weight loss Psychological ROS: negative for - behavioral disorder, hallucinations, memory difficulties, mood swings or suicidal ideation Ophthalmic ROS: negative for - blurry vision, double vision, eye pain or loss of vision ENT ROS: negative for - epistaxis, nasal discharge, oral lesions, sore throat, tinnitus or vertigo Allergy and Immunology ROS: negative for - hives or itchy/watery eyes Hematological and Lymphatic ROS: negative for - bleeding problems, bruising or swollen lymph nodes Endocrine ROS: negative for - galactorrhea, hair pattern changes, polydipsia/polyuria or temperature intolerance Respiratory ROS: negative for - cough, hemoptysis, shortness of breath or wheezing Cardiovascular ROS: negative for - chest pain, dyspnea on exertion, edema or irregular heartbeat Gastrointestinal ROS: negative for - abdominal pain, diarrhea, hematemesis, nausea/vomiting or stool incontinence Genito-Urinary ROS: negative for - dysuria, hematuria, incontinence or urinary frequency/urgency Musculoskeletal ROS: negative for - joint swelling or muscular weakness Neurological ROS: as noted in HPI Dermatological ROS: negative for rash and skin lesion changes  Physical Examination: Blood pressure (!) 117/58, pulse 70, temperature 97.8 F (36.6 C), temperature source Oral, resp. rate (!) 23, height 5\' 6"  (1.676 m), weight 66.9 kg (147 lb 9 oz), SpO2 95 %.  HEENT-  Normocephalic, no lesions, without obvious abnormality.  Normal external eye and conjunctiva.  Normal TM's bilaterally.  Normal auditory canals and external ears. Normal external nose, mucus membranes and septum.  Normal pharynx. Cardiovascular- S1, S2 normal, pulses palpable throughout   Lungs- chest clear, no wheezing, rales, normal symmetric air entry Abdomen- soft, non-tender; bowel sounds normal; no masses,  no organomegaly Extremities- no edema Lymph-no adenopathy palpable Musculoskeletal-no joint tenderness, deformity or  swelling Skin-warm and dry, no hyperpigmentation, vitiligo, or suspicious lesions  Neurological Examination Mental Status: Alert, oriented, thought content appropriate.  Speech fluent without evidence of aphasia.  Able to follow 3 step commands without difficulty. Cranial Nerves: II: Discs flat bilaterally; Visual fields grossly normal, pupils equal, round, reactive to light and accommodation III,IV, VI: ptosis not present, extra-ocular motions intact bilaterally V,VII: mild right facial droop, facial light touch sensation decreased on the right VIII: hearing normal bilaterally IX,X: gag reflex present XI: bilateral shoulder shrug XII: midline tongue extension Motor: Right : Upper extremity   5-/5 With weak hand grip   Left:     Upper extremity   5/5  Lower extremity   5-/5       Lower extremity   5-/5 Tone and bulk:normal tone throughout; no atrophy noted Sensory: Pinprick and light touch decreased in the RUE Deep Tendon Reflexes: 2+ in the upper extremities, 1+ at the knees and absent at the ankles.   Plantars: Right: upgoing   Left: downgoing Cerebellar: Mild dysmetria with right finger-to-nose testing and normal heel-to-shin testing bilaterally Gait: not tested due to safety concerns    Laboratory Studies:  Basic Metabolic Panel:  Recent Labs Lab 07/15/16 1228  NA 135  K 4.3  CL 100*  CO2 26  GLUCOSE 174*  BUN 16  CREATININE 0.92  CALCIUM 9.5    Liver Function Tests:  Recent Labs Lab 07/15/16 1228  AST 69*  ALT 45  ALKPHOS 121  BILITOT 0.5  PROT 7.5  ALBUMIN 3.9   No results for input(s): LIPASE, AMYLASE in the last 168 hours. No results for input(s): AMMONIA in the last 168 hours.  CBC:  Recent Labs Lab 07/15/16 1228  WBC 4.1  NEUTROABS 2.4  HGB 11.7*  HCT 33.5*  MCV 95.6  PLT 243    Cardiac Enzymes:  Recent Labs Lab 07/15/16 1228  TROPONINI <0.03    BNP: Invalid input(s): POCBNP  CBG:  Recent Labs Lab 07/15/16 1709  07/15/16 2104 07/16/16 0741 07/16/16 1159  GLUCAP 165* 278* 284* 287*    Microbiology: Results for orders placed or performed during the hospital encounter of 03/11/16  Urine culture     Status: None   Collection Time: 03/11/16  8:02 PM  Result Value Ref Range Status   Specimen Description URINE, RANDOM  Final   Special Requests NONE  Final   Culture NO GROWTH Performed at [facility]   Final   Report Status 03/13/2016 FINAL  Final    Coagulation Studies:  Recent Labs  07/15/16 1228  LABPROT 12.6  INR 0.94    Urinalysis:  Recent Labs Lab 07/16/16 0030  COLORURINE YELLOW*  LABSPEC 1.002*  PHURINE 6.0  GLUCOSEU >=500*  HGBUR NEGATIVE  BILIRUBINUR NEGATIVE  KETONESUR NEGATIVE  PROTEINUR NEGATIVE  NITRITE NEGATIVE  LEUKOCYTESUR TRACE*    Lipid Panel:    Component Value Date/Time   CHOL 234 (H) 07/16/2016 1158   TRIG 111 07/16/2016 1158   HDL 116 07/16/2016 1158   CHOLHDL 2.0 07/16/2016 1158   VLDL 22 07/16/2016 1158   LDLCALC 96 07/16/2016 1158    HgbA1C:  Lab Results  Component Value Date   HGBA1C 10.8 (H) 04/09/2016    Urine Drug Screen:  No results found for: LABOPIA, COCAINSCRNUR, LABBENZ, AMPHETMU, THCU, LABBARB  Alcohol Level: No results for input(s): ETH in the last 168 hours.  Other results: EKG: normal sinus rhythm at 77 bpm.  Imaging: Ct Head Wo Contrast  Result Date: 07/15/2016 CLINICAL DATA:  Right arm numbness. EXAM: CT HEAD WITHOUT CONTRAST TECHNIQUE: Contiguous axial images were obtained from the base of the skull through the vertex without intravenous contrast. COMPARISON:  CT head 06/28/2011 FINDINGS: Brain: Moderate atrophy with progression. Ventricular enlargement with progression and felt to be due to atrophy. Extensive white matter hypodensity diffusely with progression since the prior study. No underlying mass or hemorrhage. No acute cortical infarction. No shift of the midline structures. Vascular: No hyperdense vessel  or unexpected calcification. Skull: Negative Sinuses/Orbits: Prior sinus surgery. Paranasal sinuses clear. Bilateral lens replacement. No orbital lesion. Other: None IMPRESSION: Progression of atrophy. Progression of white matter hypodensity diffusely most likely due to chronic microvascular ischemia. No acute abnormality. Electronically Signed   By: Franchot Gallo M.D.   On: 07/15/2016 13:08   Mr Brain Wo Contrast  Result Date: 07/16/2016 CLINICAL DATA:  CVA.  Right-sided weakness EXAM: MRI HEAD WITHOUT CONTRAST MRA HEAD WITHOUT CONTRAST TECHNIQUE: Multiplanar, multiecho pulse sequences of the brain and surrounding structures were obtained without intravenous contrast. Angiographic images of the head were obtained using MRA technique without contrast. COMPARISON:  None. CT head 07/15/2016 FINDINGS: MRI HEAD FINDINGS Brain: 1 cm  area of restricted diffusion in the left posterior frontal cortex over the convexity compatible with acute infarct. This is in the motor strip and consistent with the patient's right arm and hand weakness. No other areas of acute infarct Advanced atrophy. Extensive chronic microvascular ischemic changes throughout the white matter and pons. Small chronic infarcts in the left cerebellum. Negative for hemorrhage or mass. Pituitary normal in size. Vascular: Normal arterial flow voids. Skull and upper cervical spine: Negative Sinuses/Orbits: Negative Other: None MRA HEAD FINDINGS Suboptimal exam. Signal loss is present in the circle of Willis due to artifact and tortuosity of vessels. There is significant area of signal loss in the middle cerebral arteries and anterior cerebral arteries proximally. Both vertebral arteries are patent to the basilar. Basilar is widely patent. Superior cerebellar and posterior cerebral arteries are patent bilaterally. Both internal carotid arteries are patent. Limited evaluation of the anterior and middle cerebral arteries which appear patent. IMPRESSION: 1 cm  acute infarct in the left motor strip consistent with the patient's right arm weakness. Advanced atrophy and advanced chronic microvascular ischemia Suboptimal intracranial MRA due to artifact and vessel tortuosity. Electronically Signed   By: Franchot Gallo M.D.   On: 07/16/2016 10:33   US Carotid Bilateral (at [facility] Only)  Result Date: 07/16/2016 CLINICAL DATA:  82 year old female with history of cerebral vascular accident. Cardiovascular risk factors include known prior stroke/ TIA, coronary artery disease, hyperlipidemia, diabetes. EXAM: BILATERAL CAROTID DUPLEX ULTRASOUND TECHNIQUE: Pearline Cables scale imaging, color Doppler and duplex ultrasound were performed of bilateral carotid and vertebral arteries in the neck. COMPARISON:  No prior duplex FINDINGS: Criteria: Quantification of carotid stenosis is based on velocity parameters that correlate the residual internal carotid diameter with NASCET-based stenosis levels, using the diameter of the distal internal carotid lumen as the denominator for stenosis measurement. The following velocity measurements were obtained: RIGHT ICA:  Systolic 87 cm/sec, Diastolic 16 cm/sec CCA:  69 cm/sec SYSTOLIC ICA/CCA RATIO:  1.3 ECA:  62 cm/sec LEFT ICA:  Systolic 98 cm/sec, Diastolic 17 cm/sec CCA:  56 cm/sec SYSTOLIC ICA/CCA RATIO:  1.8 ECA:  79 cm/sec Right Brachial SBP: Not acquired Left Brachial SBP: Not acquired RIGHT CAROTID ARTERY: No significant calcified disease of the right common carotid artery. Intermediate waveform maintained. Minimal plaque without significant calcifications at the right carotid bifurcation. Low resistance waveform of the right ICA. No significant tortuosity. RIGHT VERTEBRAL ARTERY: Antegrade flow with low resistance waveform. LEFT CAROTID ARTERY: No significant calcified disease of the left common carotid artery. Intermediate waveform maintained. Minimal plaque at the left carotid bifurcation without significant calcifications. Low resistance  waveform of the left ICA. LEFT VERTEBRAL ARTERY:  Antegrade flow with low resistance waveform. IMPRESSION: Color duplex indicates minimal homogeneous plaque, with no hemodynamically significant stenosis by duplex criteria in the extracranial cerebrovascular circulation. Signed, Dulcy Fanny. Earleen Newport, DO [facility] Electronically Signed   By: Corrie Mckusick D.O.   On: 07/16/2016 11:10   Mr Jodene Nam Head/brain [id number] Cm  Result Date: 07/16/2016 CLINICAL DATA:  CVA.  Right-sided weakness EXAM: MRI HEAD WITHOUT CONTRAST MRA HEAD WITHOUT CONTRAST TECHNIQUE: Multiplanar, multiecho pulse sequences of the brain and surrounding structures were obtained without intravenous contrast. Angiographic images of the head were obtained using MRA technique without contrast. COMPARISON:  None. CT head 07/15/2016 FINDINGS: MRI HEAD FINDINGS Brain: 1 cm area of restricted diffusion in the left posterior frontal cortex over the convexity compatible with acute infarct. This is in the motor strip and  consistent with the patient's right arm and hand weakness. No other areas of acute infarct Advanced atrophy. Extensive chronic microvascular ischemic changes throughout the white matter and pons. Small chronic infarcts in the left cerebellum. Negative for hemorrhage or mass. Pituitary normal in size. Vascular: Normal arterial flow voids. Skull and upper cervical spine: Negative Sinuses/Orbits: Negative Other: None MRA HEAD FINDINGS Suboptimal exam. Signal loss is present in the circle of Willis due to artifact and tortuosity of vessels. There is significant area of signal loss in the middle cerebral arteries and anterior cerebral arteries proximally. Both vertebral arteries are patent to the basilar. Basilar is widely patent. Superior cerebellar and posterior cerebral arteries are patent bilaterally. Both internal carotid arteries are patent. Limited evaluation of the anterior and middle cerebral  arteries which appear patent. IMPRESSION: 1 cm acute infarct in the left motor strip consistent with the patient's right arm weakness. Advanced atrophy and advanced chronic microvascular ischemia Suboptimal intracranial MRA due to artifact and vessel tortuosity. Electronically Signed   By: Franchot Gallo M.D.   On: 07/16/2016 10:33    Assessment: 82 y.o. female with multiple medical problems who presents with a right hemiparesis.  On ASA at home.  MRI of the brain reviewed and shows a small left motor strip acute infarct.  Concern is for an embolic etiology.  Carotid dopplers show no evidence of hemodynamically significant stenosis.  Echocardiogram pending.  A1c 10.8 three months ago, LDL 103.  Stroke Risk Factors - diabetes mellitus and hyperlipidemia  Plan: 1. Statin for lipid management with target LDL<70. 2. MRI, MRA  of the brain without contrast 3. PT consult, OT consult, Speech consult 4. Prophylactic therapy-ASA 81mg , Plavix 75mg  daily 5. NPO until RN stroke swallow screen 6. Telemetry monitoring 7. Frequent neuro checks 8. Holter/LINQ as an outpatient     Alexis Goodell, MD Neurology [phone] 07/16/2016, 12:32 PM

## 2016-07-16 NOTE — Progress Notes (Signed)
PT Cancellation Note  Patient Details Name: DANIELLY HINA MRN: [id number] DOB: 1931-12-02   Cancelled Treatment:    Reason Eval/Treat Not Completed: Patient at procedure or test/unavailable (Consult received and chart reviewed.  Patient currently off unit for diagnostic testing. Will re-attempt at later time/date as patient available and medically appropriate.)   Francia Verry H. Owens Shark, PT, DPT, NCS 07/16/16, 9:47 AM [phone]

## 2016-07-16 NOTE — Evaluation (Signed)
Occupational Therapy Evaluation Patient Details Name: CAMREE CABA MRN: [id number] DOB: [birth year] Today's Date: 07/16/2016    History of Present Illness Pt. is an 82 y.o. female who was admitted to [facility] for workup following a CVA. Imaging showed an acute infarct affecting the left motor strip.   Clinical Impression   Pt. Is an 82 y.o female who was admitted to [facility] for workup following CVA. Pt. presents with impaired dominant RUE strength, coordination, apraxia, inconsistent sensory and proprioceptive awareness, and decreased activity tolerance which limit her ability to complete ADL tasks. Pt. Could benefit from skilled OT services for ADL training, neuromuscular re-ed., UE there. Ex, and pt. education about home modification/DME. Pt. Could benefit from SNF  Level of care upon discharge, and follow-up OT services.    Follow Up Recommendations    SNF   Equipment Recommendations       Recommendations for Other Services       Precautions / Restrictions Restrictions Weight Bearing Restrictions: No                                                     ADL Overall ADL's : Needs assistance/impaired                                       General ADL Comments: Pt. has difficulty using her right hand to complete self-feeding, and grooming tasks secondary to apraxia.     Vision     Perception     Praxis      Pertinent Vitals/Pain Pain Assessment: 0-10 Pain Score:  (Positive intermittent sharp pain radiating  down LEs. Chronic componenet to it.)     Hand Dominance     Extremity/Trunk Assessment Upper Extremity Assessment Upper Extremity Assessment: RUE deficits/detail RUE Deficits / Details: RUE strength: RUE shoulde flexion, abduction 3/5, elbow flexion/extension 4/5, wrist flexion, extension 4-/5 Grip strength: Right: 2#, Left: 17# RUE Sensation: decreased light touch;decreased proprioception RUE Coordination: decreased fine  motor;decreased gross motor (Apraxia)           Communication     Cognition Arousal/Alertness: Awake/alert Behavior During Therapy: WFL for tasks assessed/performed Overall Cognitive Status: Within Functional Limits for tasks assessed                     General Comments       Exercises       Shoulder Instructions      Home Living Family/patient expects to be discharged to:: Private residence Living Arrangements: Children Available Help at Discharge: Family;Available PRN/intermittently Type of Home: House Home Access: Stairs to enter Entrance Stairs-Number of Steps: 3   Home Layout: One level (Recently moved)     Bathroom Shower/Tub: Tub/shower unit Shower/tub characteristics: Curtain Bathroom Toilet: Handicapped height Bathroom Accessibility: No   Home Equipment: Environmental consultant - 2 wheels;Shower seat;Cane - single point          Prior Functioning/Environment Level of Independence: Independent        Comments: Pt. has not driven in the past 2-3 months.        OT Problem List: Decreased strength;Decreased range of motion;Pain   OT Treatment/Interventions:      OT Goals(Current goals can be found in the care plan section) Acute  Rehab OT Goals Patient Stated Goal: To regain independence OT Goal Formulation: With patient Potential to Achieve Goals: Good  OT Frequency:     Barriers to D/C:            Co-evaluation              End of Session    Activity Tolerance: Patient limited by fatigue Patient left: in bed;with call bell/phone within reach;with bed alarm set   Time: 1400-1430 OT Time Calculation (min): 30 min Charges:  OT General Charges $OT Visit: 1 Procedure OT Evaluation $OT Eval Moderate Complexity: 1 Procedure G-Codes:    Harrel Carina, MS, OTR/L 07/16/2016, 3:49 PM

## 2016-07-16 NOTE — Progress Notes (Signed)
Pt is out of her room for tests. Chart reviewed. Pt is currently on a heart healthy diet and passed swallowing screen. Reattempt eval when Pt is available.

## 2016-07-16 NOTE — Evaluation (Signed)
Clinical/Bedside Swallow Evaluation Patient Details  Name: [name] [name] MRN: [id number] Date of Birth: 1931/10/27  Today's Date: 07/16/2016 Time: SLP Start Time (ACUTE ONLY): 1225 SLP Stop Time (ACUTE ONLY): 1257 SLP Time Calculation (min) (ACUTE ONLY): 32 min  Past Medical History:  Past Medical History:  Diagnosis Date  . Anxiety state, unspecified   . CAD (coronary artery disease)   . Cancer [facility])    2004 Right breast, found on mammogram, radiation therapy, Dr. Bryson Ha, uterine cancer,   . COPD (chronic obstructive pulmonary disease) ([place])   . Diabetes mellitus   . Esophageal reflux   . Heart burn   . Hyperlipidemia   . IBS (irritable bowel syndrome)   . Myocardial infarction    Cath negative except for 40% occlusion LAD.  Pt not candidate for betablocker or ACEI because of hypotension  . OSA (obstructive sleep apnea)   . Osteoporosis 02/21/09   DEXA scan showed osteoporosis with left femur T-score -2.8.  Marland Kitchen Presbyacusis   . Thyroid disease    Hypothyroid  . Vitamin D deficiency    Past Surgical History:  Past Surgical History:  Procedure Laterality Date  . ABDOMINAL HYSTERECTOMY     uterine cancer  . BREAST SURGERY    . COLON SURGERY  2013   done at [facility]   HPI: Per admitting history and physical:   [name] [name]  is a 82 y.o. female with a known history of Hypertension, CAD, diabetes comes to the emergency room accompanied by her daughter with complaints of right upper extremity weakness numbness and heaviness.  Assessment / Plan / Recommendation Clinical Impression  Pt presents with mostly functinoal swallowing abilities. No overt s/s of aspiration with any tested consistency. Pt presented with mild delayed oral tranist with solids but was able to clear all material adequately. Vocal quality remained clear ,laryngeal elevation appeared adequate. Pt was leaning to the right and presents with right  side weakness however, oral motor structiures functioned  adequately. Speech was 100 percent intelligible and appropriate during the assessment. rec continue with regular heart healthy diet. F/u 1-3 days with toleration of diet. No ST indicated at discharge.    Aspiration Risk  No limitations    Diet Recommendation Regular   Liquid Administration via: Cup;Straw Medication Administration: Whole meds with liquid Supervision: Comment (May need some assistance with meals) Compensations: Small sips/bites Postural Changes: Seated upright at 90 degrees    Other  Recommendations Oral Care Recommendations: Patient independent with oral care   Follow up Recommendations        Frequency and Duration min 1 x/week  1 week       Prognosis        Swallow Study   General Date of Onset: 07/15/16 Type of Study: Bedside Swallow Evaluation Diet Prior to this Study: Regular Temperature Spikes Noted: No Respiratory Status: Room air History of Recent Intubation: No Behavior/Cognition: Alert;Cooperative;Pleasant mood Oral Cavity Assessment: Within Functional Limits Oral Care Completed by SLP: No Oral Cavity - Dentition: Adequate natural dentition Vision: Functional for self-feeding Self-Feeding Abilities: Needs assist Patient Positioning: Upright in bed Baseline Vocal Quality: Normal    Oral/Motor/Sensory Function Overall Oral Motor/Sensory Function: Within functional limits   Ice Chips Ice chips: Not tested Presentation: Cup   Thin Liquid Thin Liquid: Within functional limits Presentation: Cup;Straw    Nectar Thick Nectar Thick Liquid: Not tested   Honey Thick Honey Thick Liquid: Not tested   Puree Puree: Within functional limits Presentation: Spoon   Solid  GO   Solid: Within functional limits Presentation: Self Fed        Lucila Maine 07/16/2016,12:58 PM

## 2016-07-16 NOTE — Progress Notes (Signed)
PT Cancellation Note  Patient Details Name: [name] [name] MRN: [id number] DOB: 1932/04/19   Cancelled Treatment:    Reason Eval/Treat Not Completed: Patient at procedure or test/unavailable (Patient still off unit for testing.  Will re-attempt at later time/date as appropriate.)   Anjalina Bergevin H. Owens Shark, PT, DPT, NCS 07/16/16, 11:34 AM [phone]

## 2016-07-16 NOTE — Plan of Care (Signed)
Problem: Education: Goal: Knowledge of secondary prevention will improve Outcome: Progressing DM educator is consulted for better glucose control.  Discussed insulin and checking bs at home.  Pt states that she does use insulin at home. Goal: Knowledge of patient specific risk factors addressed and post discharge goals established will improve Outcome: Progressing DM, HLD.  Pt aware she will be discharged on statin and insulin.  Problem: Nutrition: Goal: Risk of aspiration will decrease Outcome: Completed/Met Date Met: 07/16/16 Per SLP  Problem: Tissue Perfusion: Goal: Cerebral tissue perfusion will improve (applicable to all stroke diagnoses) Outcome: Progressing No new signs of CVA

## 2016-07-16 NOTE — Progress Notes (Signed)
*  PRELIMINARY RESULTS* Echocardiogram 2D Echocardiogram has been performed.  [name] [name] 07/16/2016, 11:37 AM

## 2016-07-16 NOTE — Plan of Care (Signed)
Problem: SLP Dysphagia Goals Goal: Misc Dysphagia Goal Pt will tolerate least restrictive diet considered safe without s/s of aspiration

## 2016-07-16 NOTE — Progress Notes (Signed)
Inpatient Diabetes Program Recommendations  AACE/ADA: New Consensus Statement on Inpatient Glycemic Control (2015)  Target Ranges:  Prepandial:   less than 140 mg/dL      Peak postprandial:   less than 180 mg/dL (1-2 hours)      Critically ill patients:  140 - 180 mg/dL   Lab Results  Component Value Date   GLUCAP 287 (H) 07/16/2016   HGBA1C 10.8 (H) 04/09/2016    Review of Glycemic ControlResults for VELINDA, ROSENKRANTZ (MRN [id number]) as of 07/16/2016 12:27  Ref. Range 07/15/2016 17:09 07/15/2016 21:04 07/16/2016 07:41 07/16/2016 11:59  Glucose-Capillary Latest Ref Range: 65 - 99 mg/dL 165 (H) 278 (H) 284 (H) 287 (H)   Diabetes history: Type 2 diabetes Outpatient Diabetes medications: Humalog 9 units tid with meals, Metformin 500 mg bid, Metformin 500 mg bid, Lantus 24 units q HS Current orders for Inpatient glycemic control:  Novolog sensitive tid with meals and HS  Inpatient Diabetes Program Recommendations:   Please consider restarting Lantus 24 units q HS.  Sent text page to Dr. Tressia Miners.  Thanks, Adah Perl, RN, BC-ADM Inpatient Diabetes Coordinator Pager [phone] (8a-5p)

## 2016-07-16 NOTE — Progress Notes (Signed)
[facility] [place] NAME: [name] [name]    MR#:  [id number]  DATE OF BIRTH:  1932-02-22  SUBJECTIVE:  CHIEF COMPLAINT:   Chief Complaint  Patient presents with  . Numbness   - Admitted with right arm weakness and MRI noted acute stroke. - awaiting PT/OT consults  REVIEW OF SYSTEMS:  Review of Systems  Constitutional: Negative for chills, fever, malaise/fatigue and weight loss.  HENT: Negative for ear discharge, ear pain, hearing loss and nosebleeds.   Eyes: Negative for blurred vision, double vision and photophobia.  Respiratory: Negative for cough, hemoptysis, shortness of breath and wheezing.   Cardiovascular: Negative for chest pain, palpitations, orthopnea and leg swelling.  Gastrointestinal: Negative for abdominal pain, constipation, diarrhea, heartburn, melena, nausea and vomiting.  Genitourinary: Negative for dysuria.  Musculoskeletal: Negative for back pain, myalgias and neck pain.  Skin: Negative for rash.  Neurological: Positive for focal weakness. Negative for dizziness, sensory change, speech change and headaches.  Endo/Heme/Allergies: Does not bruise/bleed easily.  Psychiatric/Behavioral: Negative for depression.    DRUG ALLERGIES:   Allergies  Allergen Reactions  . Penicillins Rash    Has patient had a PCN reaction causing immediate rash, facial/tongue/throat swelling, SOB or lightheadedness with hypotension: No Has patient had a PCN reaction causing severe rash involving mucus membranes or skin necrosis: No Has patient had a PCN reaction that required hospitalization No Has patient had a PCN reaction occurring within the last 10 years: No If all of the above answers are "NO", then may proceed with Cephalosporin use.     VITALS:  Blood pressure (!) 117/58, pulse 70, temperature 97.8 F (36.6 C), temperature source Oral, resp. rate (!) 23, height 5\' 6"  (1.676 m), weight 66.9 kg (147 lb 9 oz), SpO2 95  %.  PHYSICAL EXAMINATION:  Physical Exam  GENERAL:  82 y.o.-year-old patient lying in the bed with no acute distress.  EYES: Pupils equal, round, reactive to light and accommodation. No scleral icterus. Extraocular muscles intact.  HEENT: Head atraumatic, normocephalic. Oropharynx and nasopharynx clear.  NECK:  Supple, no jugular venous distention. No thyroid enlargement, no tenderness.  LUNGS: Normal breath sounds bilaterally, no wheezing, rales,rhonchi or crepitation. No use of accessory muscles of respiration.  CARDIOVASCULAR: S1, S2 normal. No murmurs, rubs, or gallops.  ABDOMEN: Soft, nontender, nondistended. Bowel sounds present. No organomegaly or mass.  EXTREMITIES: No pedal edema, cyanosis, or clubbing.  NEUROLOGIC: Cranial nerves II through XII are intact. Muscle strength 5/5 in all extremities except right hand grip is decreased. Sensation intact. Gait not checked.  PSYCHIATRIC: The patient is alert and oriented x 3.  SKIN: No obvious rash, lesion, or ulcer.    LABORATORY PANEL:   CBC  Recent Labs Lab 07/15/16 1228  WBC 4.1  HGB 11.7*  HCT 33.5*  PLT 243   ------------------------------------------------------------------------------------------------------------------  Chemistries   Recent Labs Lab 07/15/16 1228  NA 135  K 4.3  CL 100*  CO2 26  GLUCOSE 174*  BUN 16  CREATININE 0.92  CALCIUM 9.5  AST 69*  ALT 45  ALKPHOS 121  BILITOT 0.5   ------------------------------------------------------------------------------------------------------------------  Cardiac Enzymes  Recent Labs Lab 07/15/16 1228  TROPONINI <0.03   ------------------------------------------------------------------------------------------------------------------  RADIOLOGY:  Ct Head Wo Contrast  Result Date: 07/15/2016 CLINICAL DATA:  Right arm numbness. EXAM: CT HEAD WITHOUT CONTRAST TECHNIQUE: Contiguous axial images were obtained from the base of the skull through the  vertex without intravenous contrast. COMPARISON:  CT head 06/28/2011 FINDINGS:  Brain: Moderate atrophy with progression. Ventricular enlargement with progression and felt to be due to atrophy. Extensive white matter hypodensity diffusely with progression since the prior study. No underlying mass or hemorrhage. No acute cortical infarction. No shift of the midline structures. Vascular: No hyperdense vessel or unexpected calcification. Skull: Negative Sinuses/Orbits: Prior sinus surgery. Paranasal sinuses clear. Bilateral lens replacement. No orbital lesion. Other: None IMPRESSION: Progression of atrophy. Progression of white matter hypodensity diffusely most likely due to chronic microvascular ischemia. No acute abnormality. Electronically Signed   By: Franchot Gallo M.D.   On: 07/15/2016 13:08   Mr Brain Wo Contrast  Result Date: 07/16/2016 CLINICAL DATA:  CVA.  Right-sided weakness EXAM: MRI HEAD WITHOUT CONTRAST MRA HEAD WITHOUT CONTRAST TECHNIQUE: Multiplanar, multiecho pulse sequences of the brain and surrounding structures were obtained without intravenous contrast. Angiographic images of the head were obtained using MRA technique without contrast. COMPARISON:  None. CT head 07/15/2016 FINDINGS: MRI HEAD FINDINGS Brain: 1 cm area of restricted diffusion in the left posterior frontal cortex over the convexity compatible with acute infarct. This is in the motor strip and consistent with the patient's right arm and hand weakness. No other areas of acute infarct Advanced atrophy. Extensive chronic microvascular ischemic changes throughout the white matter and pons. Small chronic infarcts in the left cerebellum. Negative for hemorrhage or mass. Pituitary normal in size. Vascular: Normal arterial flow voids. Skull and upper cervical spine: Negative Sinuses/Orbits: Negative Other: None MRA HEAD FINDINGS Suboptimal exam. Signal loss is present in the circle of Willis due to artifact and tortuosity of vessels.  There is significant area of signal loss in the middle cerebral arteries and anterior cerebral arteries proximally. Both vertebral arteries are patent to the basilar. Basilar is widely patent. Superior cerebellar and posterior cerebral arteries are patent bilaterally. Both internal carotid arteries are patent. Limited evaluation of the anterior and middle cerebral arteries which appear patent. IMPRESSION: 1 cm acute infarct in the left motor strip consistent with the patient's right arm weakness. Advanced atrophy and advanced chronic microvascular ischemia Suboptimal intracranial MRA due to artifact and vessel tortuosity. Electronically Signed   By: Franchot Gallo M.D.   On: 07/16/2016 10:33   US Carotid Bilateral (at [facility] Only)  Result Date: 07/16/2016 CLINICAL DATA:  82 year old female with history of cerebral vascular accident. Cardiovascular risk factors include known prior stroke/ TIA, coronary artery disease, hyperlipidemia, diabetes. EXAM: BILATERAL CAROTID DUPLEX ULTRASOUND TECHNIQUE: Pearline Cables scale imaging, color Doppler and duplex ultrasound were performed of bilateral carotid and vertebral arteries in the neck. COMPARISON:  No prior duplex FINDINGS: Criteria: Quantification of carotid stenosis is based on velocity parameters that correlate the residual internal carotid diameter with NASCET-based stenosis levels, using the diameter of the distal internal carotid lumen as the denominator for stenosis measurement. The following velocity measurements were obtained: RIGHT ICA:  Systolic 87 cm/sec, Diastolic 16 cm/sec CCA:  69 cm/sec SYSTOLIC ICA/CCA RATIO:  1.3 ECA:  62 cm/sec LEFT ICA:  Systolic 98 cm/sec, Diastolic 17 cm/sec CCA:  56 cm/sec SYSTOLIC ICA/CCA RATIO:  1.8 ECA:  79 cm/sec Right Brachial SBP: Not acquired Left Brachial SBP: Not acquired RIGHT CAROTID ARTERY: No significant calcified disease of the right common carotid artery. Intermediate waveform maintained. Minimal plaque without  significant calcifications at the right carotid bifurcation. Low resistance waveform of the right ICA. No significant tortuosity. RIGHT VERTEBRAL ARTERY: Antegrade flow with low resistance waveform. LEFT CAROTID ARTERY: No significant calcified disease of the left common carotid artery. Intermediate  waveform maintained. Minimal plaque at the left carotid bifurcation without significant calcifications. Low resistance waveform of the left ICA. LEFT VERTEBRAL ARTERY:  Antegrade flow with low resistance waveform. IMPRESSION: Color duplex indicates minimal homogeneous plaque, with no hemodynamically significant stenosis by duplex criteria in the extracranial cerebrovascular circulation. Signed, Dulcy Fanny. Earleen Newport, DO [facility] Electronically Signed   By: Corrie Mckusick D.O.   On: 07/16/2016 11:10   Mr Jodene Nam Head/brain [id number] Cm  Result Date: 07/16/2016 CLINICAL DATA:  CVA.  Right-sided weakness EXAM: MRI HEAD WITHOUT CONTRAST MRA HEAD WITHOUT CONTRAST TECHNIQUE: Multiplanar, multiecho pulse sequences of the brain and surrounding structures were obtained without intravenous contrast. Angiographic images of the head were obtained using MRA technique without contrast. COMPARISON:  None. CT head 07/15/2016 FINDINGS: MRI HEAD FINDINGS Brain: 1 cm area of restricted diffusion in the left posterior frontal cortex over the convexity compatible with acute infarct. This is in the motor strip and consistent with the patient's right arm and hand weakness. No other areas of acute infarct Advanced atrophy. Extensive chronic microvascular ischemic changes throughout the white matter and pons. Small chronic infarcts in the left cerebellum. Negative for hemorrhage or mass. Pituitary normal in size. Vascular: Normal arterial flow voids. Skull and upper cervical spine: Negative Sinuses/Orbits: Negative Other: None MRA HEAD FINDINGS Suboptimal exam. Signal loss is present in the circle  of Willis due to artifact and tortuosity of vessels. There is significant area of signal loss in the middle cerebral arteries and anterior cerebral arteries proximally. Both vertebral arteries are patent to the basilar. Basilar is widely patent. Superior cerebellar and posterior cerebral arteries are patent bilaterally. Both internal carotid arteries are patent. Limited evaluation of the anterior and middle cerebral arteries which appear patent. IMPRESSION: 1 cm acute infarct in the left motor strip consistent with the patient's right arm weakness. Advanced atrophy and advanced chronic microvascular ischemia Suboptimal intracranial MRA due to artifact and vessel tortuosity. Electronically Signed   By: Franchot Gallo M.D.   On: 07/16/2016 10:33    EKG:   Orders placed or performed during the hospital encounter of 07/15/16  . ED EKG  . ED EKG    ASSESSMENT AND PLAN:    Koral Orchard  is a 82 y.o. female with a known history of Hypertension, CAD, diabetes comes to the emergency room accompanied by her daughter with complaints of right upper extremity weakness numbness and heaviness.  1. Acute CVA- with Right-arm weakness, MRI with acute left sided infarct affecting the motor strip -MRA without any remarkable pathology. -Carotid Dopplers and echocardiogram are pending. -Appreciate neurology consult -Started on aspirin, Plavix. Also on high intensity statin. -Physical therapy, occupational therapy, speech therapy  2. Hypertension -not on any medications at home. Monitor for now  3. Uncontrolled type 2 diabetes -Continue Lantus and sliding scale insulin  4. DVT prophylaxis subcutaneous Lovenox  5. Hyperlipidemia -continue statin  Physical and occupational therapy consult pending     All the records are reviewed and case discussed with Care Management/Social Workerr. Management plans discussed with the patient, family and they are in agreement.  CODE STATUS: Full Code  TOTAL  TIME TAKING CARE OF THIS PATIENT: 37 minutes.   POSSIBLE D/C IN 1-2 DAYS, DEPENDING ON CLINICAL CONDITION.   [name] M.D on 07/16/2016 at 2:17 PM  Between 7am to 6pm - Pager - [phone]  After 6pm go to [email or web address] - password [email or web address] [facility]  Office  [phone]  CC:  Primary care physician; [name], DO

## 2016-07-16 NOTE — Care Management Important Message (Signed)
Important Message  Patient Details  Name: ROQUEL CARNATHAN MRN: [id number] Date of Birth: [birth year]   Medicare Important Message Given:  Yes    Shelbie Ammons, RN 07/16/2016, 11:33 AM

## 2016-07-17 DIAGNOSIS — I10 Essential (primary) hypertension: Secondary | ICD-10-CM | POA: Diagnosis not present

## 2016-07-17 DIAGNOSIS — I679 Cerebrovascular disease, unspecified: Secondary | ICD-10-CM | POA: Diagnosis not present

## 2016-07-17 DIAGNOSIS — E785 Hyperlipidemia, unspecified: Secondary | ICD-10-CM | POA: Diagnosis not present

## 2016-07-17 DIAGNOSIS — E118 Type 2 diabetes mellitus with unspecified complications: Secondary | ICD-10-CM | POA: Diagnosis not present

## 2016-07-17 DIAGNOSIS — E119 Type 2 diabetes mellitus without complications: Secondary | ICD-10-CM | POA: Diagnosis not present

## 2016-07-17 DIAGNOSIS — E1165 Type 2 diabetes mellitus with hyperglycemia: Secondary | ICD-10-CM | POA: Diagnosis not present

## 2016-07-17 DIAGNOSIS — Z794 Long term (current) use of insulin: Secondary | ICD-10-CM | POA: Diagnosis not present

## 2016-07-17 DIAGNOSIS — M6281 Muscle weakness (generalized): Secondary | ICD-10-CM | POA: Diagnosis not present

## 2016-07-17 DIAGNOSIS — Z7982 Long term (current) use of aspirin: Secondary | ICD-10-CM | POA: Diagnosis not present

## 2016-07-17 DIAGNOSIS — I69398 Other sequelae of cerebral infarction: Secondary | ICD-10-CM | POA: Diagnosis not present

## 2016-07-17 DIAGNOSIS — I6350 Cerebral infarction due to unspecified occlusion or stenosis of unspecified cerebral artery: Secondary | ICD-10-CM | POA: Diagnosis not present

## 2016-07-17 DIAGNOSIS — E039 Hypothyroidism, unspecified: Secondary | ICD-10-CM | POA: Diagnosis not present

## 2016-07-17 DIAGNOSIS — Z8673 Personal history of transient ischemic attack (TIA), and cerebral infarction without residual deficits: Secondary | ICD-10-CM | POA: Diagnosis not present

## 2016-07-17 DIAGNOSIS — I2510 Atherosclerotic heart disease of native coronary artery without angina pectoris: Secondary | ICD-10-CM | POA: Diagnosis not present

## 2016-07-17 DIAGNOSIS — I639 Cerebral infarction, unspecified: Secondary | ICD-10-CM | POA: Diagnosis not present

## 2016-07-17 DIAGNOSIS — K219 Gastro-esophageal reflux disease without esophagitis: Secondary | ICD-10-CM | POA: Diagnosis not present

## 2016-07-17 DIAGNOSIS — Z7902 Long term (current) use of antithrombotics/antiplatelets: Secondary | ICD-10-CM | POA: Diagnosis not present

## 2016-07-17 DIAGNOSIS — Z7401 Bed confinement status: Secondary | ICD-10-CM | POA: Diagnosis not present

## 2016-07-17 LAB — BASIC METABOLIC PANEL
ANION GAP: 8 (ref 5–15)
BUN: 17 mg/dL (ref 6–20)
CALCIUM: 9.1 mg/dL (ref 8.9–10.3)
CO2: 24 mmol/L (ref 22–32)
Chloride: 102 mmol/L (ref 101–111)
Creatinine, Ser: 1.04 mg/dL — ABNORMAL HIGH (ref 0.44–1.00)
GFR, EST AFRICAN AMERICAN: 56 mL/min — AB (ref >60)
GFR, EST NON AFRICAN AMERICAN: 49 mL/min — AB (ref >60)
GLUCOSE: 259 mg/dL — AB (ref 65–99)
Potassium: 4.1 mmol/L (ref 3.5–5.1)
SODIUM: 134 mmol/L — AB (ref 135–145)

## 2016-07-17 LAB — GLUCOSE, CAPILLARY
GLUCOSE-CAPILLARY: 264 mg/dL — AB (ref 65–99)
GLUCOSE-CAPILLARY: 283 mg/dL — AB (ref 65–99)
GLUCOSE-CAPILLARY: 298 mg/dL — AB (ref 65–99)
Glucose-Capillary: 185 mg/dL — ABNORMAL HIGH (ref 65–99)

## 2016-07-17 MED ORDER — CLOPIDOGREL BISULFATE 75 MG PO TABS: 75.0000 mg | ORAL_TABLET | Freq: Every day | ORAL | 2 refills | Status: DC

## 2016-07-17 NOTE — Progress Notes (Signed)
Pt discharged via [facility] to [facility]. This Probation officer gave report to Therapist, sports at [facility]. IV and tele removed, pt being discharged on room air, no distress noted. Ammie Dalton, RN

## 2016-07-17 NOTE — Progress Notes (Signed)
Inpatient Diabetes Program Recommendations  AACE/ADA: New Consensus Statement on Inpatient Glycemic Control (2015)  Target Ranges:  Prepandial:   less than 140 mg/dL      Peak postprandial:   less than 180 mg/dL (1-2 hours)      Critically ill patients:  140 - 180 mg/dL   Lab Results  Component Value Date   GLUCAP 283 (H) 07/17/2016   HGBA1C 10.8 (H) 04/09/2016    Review of Glycemic Control   Results for JONASIA, CARLOZZI (MRN [id number]) as of 07/17/2016 14:16  Ref. Range 07/16/2016 21:24 07/16/2016 22:49 07/17/2016 00:04 07/17/2016 07:12 07/17/2016 11:57  Glucose-Capillary Latest Ref Range: 65 - 99 mg/dL 405 (H) 396 (H) 298 (H) 185 (H) 283 (H)   Diabetes history: Type 2 diabetes Outpatient Diabetes medications: Humalog 9 units tid with meals, Metformin 500 mg bid, Lantus 24 units q HS Current orders for Inpatient glycemic control: Novolog sensitive tid with meals and HS, Metformin 500mg  bid, Lantus 24 units qhs  Inpatient Diabetes Program Recommendations:  Since the patient is eating and blood sugars remain elevated despite current Novolog correction, consider adding mealtime Novolog 6 units tid- continue Novolog correction as ordered (hold mealtime insulin if patient eats less than 50%)  Gentry Fitz, RN, BA, [place], CDE Diabetes Coordinator Inpatient Diabetes Program  [phone] (Team Pager) [phone] ([place]) 07/17/2016 2:17 PM

## 2016-07-17 NOTE — Clinical Social Work Note (Signed)
Clinical Social Work Assessment  Patient Details  Name: [name] MRN: [id number] Date of Birth: Jun 15, 1932  Date of referral:  07/17/16               Reason for consult:  Facility Placement                Permission sought to share information with:  Facility Sport and exercise psychologist, Family Supports Permission granted to share information::  Yes, Verbal Permission Granted  Name::     [name],[name] Daughter [phone]  [phone]   Agency::  SNF Admissions  Relationship::     Contact Information:     Housing/Transportation Living arrangements for the past 2 months:  Single Family Home Source of Information:  Patient, Adult Children Patient Interpreter Needed:  None Criminal Activity/Legal Involvement Pertinent to Current Situation/Hospitalization:  No - Comment as needed Significant Relationships:  Adult Children Lives with:  Self Do you feel safe going back to the place where you live?  No Need for family participation in patient care:  Yes (Comment)  Care giving concerns: Patient and family feel she needs short term rehab before returning back home.   Social Worker assessment / plan:  Patient is a 82 year old female who is alert and oriented x4 but hard of hearing.  Patient and daughter state she has been to rehab before and are aware of what to expect.  CSW explained to patient's family how insurance will pay for the stay and what to expect towards discharge back home.  Paitent and her family did not express any other questions or concerns and gave CSW permission to begin bed search in [place]  Employment status:  Retired Forensic scientist:  Managed Medicare PT Recommendations:  [place] / Referral to community resources:  [place]  Patient/Family's Response to care:  Patient and family are in agreement to going to SNF for short term rehab.  Patient/Family's Understanding of and Emotional Response to Diagnosis,  Current Treatment, and Prognosis:  Patient and family are aware of current treatment plan and prognosis.  Emotional Assessment Appearance:  Appears stated age Attitude/Demeanor/Rapport:    Affect (typically observed):  Appropriate, Calm Orientation:  Oriented to Self, Oriented to Place, Oriented to  Time, Oriented to Situation Alcohol / Substance use:  Not Applicable Psych involvement (Current and /or in the community):  No (Comment)  Discharge Needs  Concerns to be addressed:  Lack of Support Readmission within the last 30 days:  No Current discharge risk:  Lives alone Barriers to Discharge:  No Barriers Identified   Anell Barr 07/17/2016, 4:59 PM

## 2016-07-17 NOTE — Discharge Summary (Signed)
[name] [name] at [place] NAME: [name] [name]    MR#:  [id number]  DATE OF BIRTH:  12/04/1931  DATE OF ADMISSION:  07/15/2016   ADMITTING PHYSICIAN: Fritzi Mandes, MD  DATE OF DISCHARGE: 07/17/16  PRIMARY CARE PHYSICIAN: [name], DO   ADMISSION DIAGNOSIS:   Muscle right arm weakness [M62.81] Ischemic stroke ([name] [name]) [I63.9]  DISCHARGE DIAGNOSIS:   Active Problems:   Acute right-sided weakness   SECONDARY DIAGNOSIS:   Past Medical History:  Diagnosis Date  . Anxiety state, unspecified   . CAD (coronary artery disease)   . Cancer [facility])    2004 Right breast, found on mammogram, radiation therapy, Dr. Bryson Ha, uterine cancer,   . COPD (chronic obstructive pulmonary disease) ([place])   . Diabetes mellitus   . Esophageal reflux   . Heart burn   . Hyperlipidemia   . IBS (irritable bowel syndrome)   . Myocardial infarction    Cath negative except for 40% occlusion LAD.  Pt not candidate for betablocker or ACEI because of hypotension  . OSA (obstructive sleep apnea)   . Osteoporosis 02/21/09   DEXA scan showed osteoporosis with left femur T-score -2.8.  [name] [name] Presbyacusis   . Thyroid disease    Hypothyroid  . Vitamin D deficiency     HOSPITAL COURSE:   [name] a 82 y.o. femalewith a known history of Hypertension, CAD, diabetes comes to the emergency room accompanied by her daughter with complaints of right upper extremity weakness numbness and heaviness.  1. Acute CVA- with Right-arm weakness, MRI with acute left sided infarct affecting the motor strip -MRA without any remarkable pathology. -Carotid Dopplers with no significant stenosis and echocardiogram are pending. -Appreciate neurology consult -Started on aspirin, Plavix. Also on high intensity statin. -Physical therapy, occupational therapy, speech therapy-into rehabilitation  2. Hypertension -not on any medications at home. Monitor for now -Blood pressure is  well controlled  3. Uncontrolled type 2 diabetes -Continue Lantus and NovoLog prior to meals  4. Hyperlipidemia -continue statin  Will be discharged to rehabilitation today  DISCHARGE CONDITIONS:   Guarded  CONSULTS OBTAINED:   Treatment Team:  Alexis Goodell, MD  DRUG ALLERGIES:   Allergies  Allergen Reactions  . Penicillins Rash    Has patient had a PCN reaction causing immediate rash, facial/tongue/throat swelling, SOB or lightheadedness with hypotension: No Has patient had a PCN reaction causing severe rash involving mucus membranes or skin necrosis: No Has patient had a PCN reaction that required hospitalization No Has patient had a PCN reaction occurring within the last 10 years: No If all of the above answers are "NO", then may proceed with Cephalosporin use.    DISCHARGE MEDICATIONS:   Allergies as of 07/17/2016      Reactions   Penicillins Rash   Has patient had a PCN reaction causing immediate rash, facial/tongue/throat swelling, SOB or lightheadedness with hypotension: No Has patient had a PCN reaction causing severe rash involving mucus membranes or skin necrosis: No Has patient had a PCN reaction that required hospitalization No Has patient had a PCN reaction occurring within the last 10 years: No If all of the above answers are "NO", then may proceed with Cephalosporin use.      Medication List    TAKE these medications   aspirin EC 81 MG tablet Take 1 tablet (81 mg total) by mouth daily.   atorvastatin 40 MG tablet Commonly known as:  LIPITOR Take 1 tablet (40 mg total)  by mouth daily.   clopidogrel 75 MG tablet Commonly known as:  PLAVIX Take 1 tablet (75 mg total) by mouth daily. Start taking on:  07/18/2016   fluticasone 50 MCG/ACT nasal spray Commonly known as:  FLONASE Place 2 sprays into both nostrils daily.   insulin lispro 100 UNIT/ML KiwkPen Commonly known as:  HUMALOG Inject 0.08 mLs (8 Units total) into the skin 3 (three)  times daily. What changed:  how much to take   LANTUS SOLOSTAR 100 UNIT/ML Solostar Pen Generic drug:  Insulin Glargine INJECT 24 UNITS SUBCUTANEOUSLY NIGHTLY.   levothyroxine 100 MCG tablet Commonly known as:  SYNTHROID, LEVOTHROID TAKE 1 TABLET(100 MCG) BY MOUTH DAILY   metFORMIN 500 MG tablet Commonly known as:  GLUCOPHAGE Take 1 tablet (500 mg total) by mouth 2 (two) times daily with a meal.   pantoprazole 40 MG tablet Commonly known as:  PROTONIX Take 1 tablet (40 mg total) by mouth 2 (two) times daily.        DISCHARGE INSTRUCTIONS:   1. PCP f/u in 1 week  DIET:   Cardiac diet  ACTIVITY:   Activity as tolerated  OXYGEN:   Home Oxygen: No.  Oxygen Delivery: room air  DISCHARGE LOCATION:   nursing home   If you experience worsening of your admission symptoms, develop shortness of breath, life threatening emergency, suicidal or homicidal thoughts you must seek medical attention immediately by calling 911 or calling your MD immediately  if symptoms less severe.  You Must read complete instructions/literature along with all the possible adverse reactions/side effects for all the Medicines you take and that have been prescribed to you. Take any new Medicines after you have completely understood and accpet all the possible adverse reactions/side effects.   Please note  You were cared for by a hospitalist during your hospital stay. If you have any questions about your discharge medications or the care you received while you were in the hospital after you are discharged, you can call the unit and asked to speak with the hospitalist on call if the hospitalist that took care of you is not available. Once you are discharged, your primary care physician will handle any further medical issues. Please note that NO REFILLS for any discharge medications will be authorized once you are discharged, as it is imperative that you return to your primary care physician (or establish a  relationship with a primary care physician if you do not have one) for your aftercare needs so that they can reassess your need for medications and monitor your lab values.    On the day of Discharge:  VITAL SIGNS:   Blood pressure 117/61, pulse 96, temperature 97.9 F (36.6 C), temperature source Oral, resp. rate 18, height 5\' 6"  (1.676 m), weight 66.9 kg (147 lb 9 oz), SpO2 96 %.  PHYSICAL EXAMINATION:    GENERAL:  82 y.o.-year-old patient lying in the bed with no acute distress.  EYES: Pupils equal, round, reactive to light and accommodation. No scleral icterus. Extraocular muscles intact.  HEENT: Head atraumatic, normocephalic. Oropharynx and nasopharynx clear.  NECK:  Supple, no jugular venous distention. No thyroid enlargement, no tenderness.  LUNGS: Normal breath sounds bilaterally, no wheezing, rales,rhonchi or crepitation. No use of accessory muscles of respiration.  CARDIOVASCULAR: S1, S2 normal. No murmurs, rubs, or gallops.  ABDOMEN: Soft, nontender, nondistended. Bowel sounds present. No organomegaly or mass.  EXTREMITIES: No pedal edema, cyanosis, or clubbing.  NEUROLOGIC: Cranial nerves II through XII are intact. Muscle strength  5/5 in all extremities except right hand grip is decreased. Sensation intact. Gait not checked.  PSYCHIATRIC: The patient is alert and oriented x 3.  SKIN: No obvious rash, lesion, or ulcer.   DATA REVIEW:   CBC  Recent Labs Lab 07/15/16 1228  WBC 4.1  HGB 11.7*  HCT 33.5*  PLT 243    Chemistries   Recent Labs Lab 07/15/16 1228 07/17/16 0940  NA 135 134*  K 4.3 4.1  CL 100* 102  CO2 26 24  GLUCOSE 174* 259*  BUN 16 17  CREATININE 0.92 1.04*  CALCIUM 9.5 9.1  AST 69*  --   ALT 45  --   ALKPHOS 121  --   BILITOT 0.5  --      Microbiology Results  Results for orders placed or performed during the hospital encounter of 03/11/16  Urine culture     Status: None   Collection Time: 03/11/16  8:02 PM  Result Value Ref Range  Status   Specimen Description URINE, RANDOM  Final   Special Requests NONE  Final   Culture NO GROWTH Performed at [facility]   Final   Report Status 03/13/2016 FINAL  Final    RADIOLOGY:  No results found.   Management plans discussed with the patient, family and they are in agreement.  CODE STATUS:     Code Status Orders        Start     Ordered   07/15/16 1837  Full code  Continuous     07/15/16 1836    Code Status History    Date Active Date Inactive Code Status Order ID Comments User Context   06/08/2015  9:01 PM 06/11/2015  9:20 PM Full Code [id number]  Henreitta Leber, MD Inpatient   06/08/2015  5:16 PM 06/08/2015  9:01 PM Full Code [id number]  Henreitta Leber, MD ED      TOTAL TIME TAKING CARE OF THIS PATIENT: 37 minutes.    [name] M.D on 07/17/2016 at 2:51 PM  Between 7am to 6pm - Pager - [phone]  After 6pm go to [email or web address] - Proofreader  [facility]  Office  [phone]  CC: Primary care physician; [name], DO   Note: This dictation was prepared with Dragon dictation along with smaller phrase technology. Any transcriptional errors that result from this process are unintentional.

## 2016-07-17 NOTE — NC FL2 (Signed)
[place] LEVEL OF CARE SCREENING TOOL     IDENTIFICATION  Patient Name: [name] [name] Birthdate: 1932/03/10 Sex: female Admission Date (Current Location): 07/15/2016  [place] and [place] Number:  Engineering geologist and Address:  [facility], [address] [place], [place] [address]      Provider Number: [id number]  Attending Physician Name and Address:  [name], MD  Relative Name and Phone Number:  Barnard,[name] [phone]  [phone]     Current Level of Care: Hospital Recommended Level of Care: [place] Prior Approval Number:    Date Approved/Denied:   PASRR Number: [id number]  Discharge Plan: SNF    Current Diagnoses: Patient Active Problem List   Diagnosis Date Noted  . Acute right-sided weakness 07/15/2016  . Peripheral vascular disease ([place]) 04/09/2016  . Elderly person living alone 01/04/2016  . Long term current use of insulin ([place]) 03/08/2014  . CKD stage 3 due to type 2 diabetes mellitus ([place]) 02/22/2014  . Hypothyroidism 09/23/2012  . GERD (gastroesophageal reflux disease) 09/23/2012  . Anxiety 07/26/2012  . Hyperlipidemia with target LDL less than 70 12/03/2011  . DM (diabetes mellitus) type II uncontrolled, periph vascular disorder ([place]) 04/03/2011  . CAD, NATIVE VESSEL 12/13/2009    Orientation RESPIRATION BLADDER Height & Weight     Self, Time, Situation, Place  Normal Continent Weight: 147 lb 9 oz (66.9 kg) Height:  5\' 6"  (167.6 cm)  BEHAVIORAL SYMPTOMS/MOOD NEUROLOGICAL BOWEL NUTRITION STATUS      Continent Diet (Carb modified diet)  AMBULATORY STATUS COMMUNICATION OF NEEDS Skin   Limited Assist Verbally Normal                       Personal Care Assistance Level of Assistance  Bathing, Dressing, Feeding Bathing Assistance: Limited assistance Feeding assistance: Independent Dressing Assistance: Limited assistance     Functional Limitations Info   Sight, Hearing, Speech Sight Info: Adequate Hearing Info: Adequate Speech Info: Adequate    SPECIAL CARE FACTORS FREQUENCY  OT (By licensed OT), PT (By licensed PT)     PT Frequency: 5x a week OT Frequency: 5x a week            Contractures Contractures Info: Not present    Additional Factors Info  Code Status, Allergies, Insulin Sliding Scale Code Status Info: Full  Allergies Info: PENICILLINS    Insulin Sliding Scale Info: insulin aspart (novoLOG) injection 0-5 Units       Current Medications (07/17/2016):  This is the current hospital active medication list Current Facility-Administered Medications  Medication Dose Route Frequency Provider Last Rate Last Dose  . acetaminophen (TYLENOL) tablet 650 mg  650 mg Oral Q4H PRN Fritzi Mandes, MD       Or  . acetaminophen (TYLENOL) solution 650 mg  650 mg Per Tube Q4H PRN Fritzi Mandes, MD       Or  . acetaminophen (TYLENOL) suppository 650 mg  650 mg Rectal Q4H PRN Fritzi Mandes, MD      . aspirin EC tablet 81 mg  81 mg Oral Daily [name], MD   81 mg at 07/17/16 1108  . atorvastatin (LIPITOR) tablet 40 mg  40 mg Oral q1800 [name], MD   40 mg at 07/16/16 1724  . clopidogrel (PLAVIX) tablet 75 mg  75 mg Oral Daily [name], MD   75 mg at 07/17/16 1108  . enoxaparin (LOVENOX) injection 40 mg  40 mg Subcutaneous Q24H  Fritzi Mandes, MD   40 mg at 07/16/16 2152  . insulin aspart (novoLOG) injection 0-5 Units  0-5 Units Subcutaneous QHS Demetrios Loll, MD   5 Units at 07/16/16 2152  . insulin aspart (novoLOG) injection 0-9 Units  0-9 Units Subcutaneous TID WC Demetrios Loll, MD   5 Units at 07/17/16 1206  . insulin glargine (LANTUS) injection 24 Units  24 Units Subcutaneous QHS [name], MD   24 Units at 07/16/16 2156  . levothyroxine (SYNTHROID, LEVOTHROID) tablet 100 mcg  100 mcg Oral QAC breakfast [name], MD   100 mcg at 07/17/16 0526  . metFORMIN (GLUCOPHAGE) tablet 500 mg  500 mg Oral BID WC [name], MD   500 mg at 07/17/16 0805  . pantoprazole (PROTONIX) EC tablet 40 mg  40 mg Oral BID [name], MD   40 mg at 07/17/16 1108     Discharge Medications: Please see discharge summary for a list of discharge medications.  Relevant Imaging Results:  Relevant Lab Results:   Additional Information SSN [id number]  Ross Ludwig, [place]

## 2016-07-17 NOTE — Clinical Social Work Note (Signed)
Patient to be d/c'ed today to [facility].  Patient and family agreeable to plans will transport via ems RN to call report to [phone] room 412.  Evette Cristal, MSW, LCSWA Mon-Fri 8a-4:30p [phone]

## 2016-07-17 NOTE — Evaluation (Signed)
Physical Therapy Evaluation Patient Details Name: [name] [name] MRN: [id number] DOB: 1931/11/06 Today's Date: 07/17/2016   History of Present Illness  presented to ER secondary to R UE, LE weakness; admitted with acute CVA. MRI significant for acute infarct to L motor strip.  Clinical Impression  Patient with fair strength in R UE/LE, but with noted delay in motor planning, coordination and reaction time (UE > LE).  Denies sensory deficit and functionally utilizes R hemi-body appropriately (no inattention noted).  Speech very effortful at times, but able to communicate needs and answer questions appropriately. Completes all mobility with RW and min assist +1; notably fatigued with minimal exertion.  Mild sway to R side, requiring min assist for balance correction.  Reports of dizziness with transition to upright; generally hypotensive, but no significant orthostasis noted during session. Would benefit from skilled PT to address above deficits and promote optimal return to PLOF; recommend transition to STR upon discharge from acute hospitalization.     Follow Up Recommendations SNF    Equipment Recommendations  Rolling walker with 5" wheels    Recommendations for Other Services       Precautions / Restrictions Precautions Precautions: Fall Restrictions Weight Bearing Restrictions: No      Mobility  Bed Mobility Overal bed mobility: Needs Assistance Bed Mobility: Supine to Sit     Supine to sit: Min assist        Transfers Overall transfer level: Needs assistance Equipment used: Rolling walker (2 wheeled) Transfers: Sit to/from Stand Sit to Stand: Min assist         General transfer comment: pulls upon RW with bilat UEs  Ambulation/Gait Ambulation/Gait assistance: Min assist Ambulation Distance (Feet): 5 Feet Assistive device: Rolling walker (2 wheeled)       General Gait Details: broad BOS with short, choppy steps  Stairs            Wheelchair  Mobility    Modified Rankin (Stroke Patients Only)       Balance Overall balance assessment: Needs assistance Sitting-balance support: No upper extremity supported;Feet supported Sitting balance-Leahy Scale: Good     Standing balance support: Bilateral upper extremity supported Standing balance-Leahy Scale: Fair                               Pertinent Vitals/Pain Pain Assessment: No/denies pain    Home Living Family/patient expects to be discharged to:: Private residence Living Arrangements: Children (son has history of stroke; patient/son "help each other") Available Help at Discharge: Family;Available PRN/intermittently Type of Home: House Home Access: Stairs to enter Entrance Stairs-Rails: Psychiatric nurse of Steps: 3 Home Layout: One level Home Equipment: Walker - 2 wheels;Shower seat;Cane - single point      Prior Function           Comments: Mod indep with SPC for ADLs and household mobility; + driving and community errands; denies fall history.     Hand Dominance        Extremity/Trunk Assessment   Upper Extremity Assessment Upper Extremity Assessment:  (bilat UE grossly at least 4-/5 throughout; R UE with decreased coordination, motor control requiring increaesd time/effort to complete functional tasks; denies sensory deficit)    Lower Extremity Assessment Lower Extremity Assessment:  (R LE grossly at least 4/5 throughout; denies sensory deficit)       Communication      Cognition Arousal/Alertness: Awake/alert Behavior During Therapy: [facility] for tasks assessed/performed Overall Cognitive  Status: Within Functional Limits for tasks assessed (limited insight into deficits and overall need for assist)                      General Comments      Exercises Other Exercises Other Exercises: Toilet transfer, SPT with RW, min assist; cuing for hand placement, safety awareness.  Sit/stand from [facility] with RW, cga/min  assist; standing balance for hygiene and clothing managemetn wiht RW, cga/min assist.   Assessment/Plan    PT Assessment Patient needs continued PT services  PT Problem List Decreased strength;Decreased cognition;Decreased balance;Decreased activity tolerance;Decreased coordination;Decreased safety awareness;Decreased mobility;Decreased knowledge of precautions          PT Treatment Interventions DME instruction;Gait training;Stair training;Functional mobility training;Therapeutic activities;Therapeutic exercise;Balance training;Patient/family education;Neuromuscular re-education    PT Goals (Current goals can be found in the Care Plan section)  Acute Rehab PT Goals Patient Stated Goal: to do it myself PT Goal Formulation: With patient Time For Goal Achievement: 07/31/16 Potential to Achieve Goals: Good    Frequency 7X/week   Barriers to discharge Decreased caregiver support      Co-evaluation               End of Session Equipment Utilized During Treatment: Gait belt Activity Tolerance: Patient limited by fatigue Patient left: in chair;with call bell/phone within reach;with chair alarm set Nurse Communication: Mobility status         Time: 1030-1055 PT Time Calculation (min) (ACUTE ONLY): 25 min   Charges:   PT Evaluation $PT Eval Moderate Complexity: 1 Procedure PT Treatments $Therapeutic Activity: 8-22 mins   PT G Codes:        Stacia Feazell H. Owens Shark, PT, DPT, NCS 07/17/16, 1:38 PM [phone]

## 2016-07-17 NOTE — Clinical Social Work Placement (Signed)
   CLINICAL SOCIAL WORK PLACEMENT  NOTE  Date:  07/17/2016  Patient Details  Name: [name] [name] MRN: [id number] Date of Birth: [birth year]  Clinical Social Work is seeking post-discharge placement for this patient at the [place] level of care (*CSW will initial, date and re-position this form in  chart as items are completed):  Yes   Patient/family provided with [place] Work Department's list of facilities offering this level of care within the geographic area requested by the patient (or if unable, by the patient's family).  Yes   Patient/family informed of their freedom to choose among providers that offer the needed level of care, that participate in Medicare, Medicaid or managed care program needed by the patient, have an available bed and are willing to accept the patient.  Yes   Patient/family informed of [facility]'s ownership interest in [facility] and [facility], as well as of the fact that they are under no obligation to receive care at these facilities.  PASRR submitted to EDS on 07/17/16     PASRR number received on       Existing PASRR number confirmed on 07/17/16     FL2 transmitted to all facilities in geographic area requested by pt/family on 07/17/16     FL2 transmitted to all facilities within larger geographic area on       Patient informed that his/her managed care company has contracts with or will negotiate with certain facilities, including the following:        Yes   Patient/family informed of bed offers received.  Patient chooses bed at [facility]     Physician recommends and patient chooses bed at      Patient to be transferred to [facility] on 07/17/16.  Patient to be transferred to facility by [facility]     Patient family notified on 07/17/16 of transfer.  Name of family member notified:  Safran,[name] [phone]  [phone]       PHYSICIAN Please sign FL2     Additional Comment:    _______________________________________________ Ross Ludwig, LCSWA 07/17/2016, 3:21 PM

## 2016-07-18 DIAGNOSIS — I10 Essential (primary) hypertension: Secondary | ICD-10-CM | POA: Diagnosis not present

## 2016-07-18 DIAGNOSIS — E119 Type 2 diabetes mellitus without complications: Secondary | ICD-10-CM | POA: Diagnosis not present

## 2016-07-18 DIAGNOSIS — I6350 Cerebral infarction due to unspecified occlusion or stenosis of unspecified cerebral artery: Secondary | ICD-10-CM | POA: Diagnosis not present

## 2016-07-18 LAB — HEMOGLOBIN A1C
HEMOGLOBIN A1C: 9.5 % — AB (ref 4.8–5.6)
Mean Plasma Glucose: 226 mg/dL

## 2016-07-18 LAB — ECHOCARDIOGRAM COMPLETE
Height: 66 in
Weight: 2361 oz

## 2016-07-23 DIAGNOSIS — E118 Type 2 diabetes mellitus with unspecified complications: Secondary | ICD-10-CM | POA: Diagnosis not present

## 2016-07-23 DIAGNOSIS — E1165 Type 2 diabetes mellitus with hyperglycemia: Secondary | ICD-10-CM | POA: Diagnosis not present

## 2016-07-23 DIAGNOSIS — Z8673 Personal history of transient ischemic attack (TIA), and cerebral infarction without residual deficits: Secondary | ICD-10-CM | POA: Diagnosis not present

## 2016-07-23 DIAGNOSIS — Z794 Long term (current) use of insulin: Secondary | ICD-10-CM | POA: Diagnosis not present

## 2016-07-29 ENCOUNTER — Encounter: Payer: Self-pay | Admitting: *Deleted

## 2016-07-29 DIAGNOSIS — I639 Cerebral infarction, unspecified: Secondary | ICD-10-CM

## 2016-08-05 ENCOUNTER — Other Ambulatory Visit: Payer: Self-pay | Admitting: *Deleted

## 2016-08-05 NOTE — Patient Outreach (Addendum)
[facility]) Care Management  08/05/2016  BURDETTE BUETI 1932/03/25 [id number]    Phone call to discharge planner Drue Novel at [place] to check status of patient's stay in rehab before scheduled visit.  This Education officer, museum was informed that patient was discharged today home with daughter with [facility] through [facility].  Patient also given diabetes education before discharge home related to using insulin supplies at home and checking blood sugars regularly.   Plan:  This social worker will inform RNCM of patient's discharge for transition of care.   Sheralyn Boatman [facility] Care Management [phone]

## 2016-08-06 ENCOUNTER — Other Ambulatory Visit: Payer: Self-pay | Admitting: *Deleted

## 2016-08-06 NOTE — Patient Outreach (Signed)
Attempt made to contact pt as part of transition of care.   Was informed by Chrystal THN  LCSW that pt discharged from [facility] 08/05/16 where pt received rehab following recent hospitalization 12/19-12/21 Ischemic stroke, acute right arm weakness.    HIPAA compliant voice message left with contact name and number.     Plan:  If no response to voice message left, plan to call again tomorrow.     [name]   [place] Care Management  [phone]

## 2016-08-07 ENCOUNTER — Encounter: Payer: Self-pay | Admitting: *Deleted

## 2016-08-07 ENCOUNTER — Other Ambulatory Visit: Payer: Self-pay | Admitting: *Deleted

## 2016-08-07 NOTE — Patient Outreach (Signed)
Transition of care call completed (pt's home number) after  first calling  daughter's phone number (no answer), pt's cell number (invalid).   Spoke with pt, HIPAA/identity verified, discussed purpose of call to follow  up post SNF and recent hospitalization discharges.  RN CM reviewed [facility] services provided to pt in the past to which pt did recall [facility] consent on file).    Pt reports doing good since discharge, lives with son plus  granddaughter [name] stays with her during the day.  Pt reports granddaughter had to leave because she was sick but she  handles her MD appointments, transportation, medications.   RN CM discussed with pt home health services ordered post discharge to which pt reports granddaughter would know that.   Pt requested RN CM call back when granddaughter is here, she can answer these questions.    Plan:  As discussed with pt, RN CM to follow up again  tomorrow telephonically- speak with granddaughter to  Complete transition of care.            As discussed with pt, plan to do home visit next week (part of transition of care).             [place] barrier letter sent to Dr. Thersa Salt, DO - informing of [facility] involvement.    [name]   [place] Care Management  [phone]

## 2016-08-08 ENCOUNTER — Other Ambulatory Visit: Payer: Self-pay | Admitting: *Deleted

## 2016-08-08 NOTE — Patient Outreach (Signed)
Transition of care call successful,follow up with pt to complete transition of care.   Spoke with pt, HIPAA/identity verified.  Pt reports cannot talk to granddaughter [name] [name]- sick, can talk to daughter [name] [name].  Spoke with daughter [name] [name] (on consent form), discussed purpose of follow up call, following pt  for transition of care.  Daughter did remember RN CM providing pt with [facility] services in the past.   RN CM inquired of daughter about home health services, follow up MD visit and discharge medications.   Daughter reports her daughter [name] [name] would know about MD follow up appointment, does not know about home health.    Patient was recently discharged from hospital and all medications have been reviewed.  Review of pt's  discharge medications with daughter showed pt was given a prescription for Plavix but not filled to which daughter agreed to have filled.  Daughter reports pt's sugars are bad, today was 72 prior to eating/ gave pt 1 piece of toast,2 eggs, 2 [place] bacon, little orange juice, coffee with cream, recheck of sugar- 105.   Daughter reports pt's sugars since SNF discharge- run low in am, high at night (400,501), trying to get them back to what they were prior to hospitalization.     Plan:  As discussed with daughter/pt yesterday, plan to follow up again next week- home visit.   [name]   [place] [facility]  [phone]

## 2016-08-09 DIAGNOSIS — E119 Type 2 diabetes mellitus without complications: Secondary | ICD-10-CM | POA: Diagnosis not present

## 2016-08-09 DIAGNOSIS — I2510 Atherosclerotic heart disease of native coronary artery without angina pectoris: Secondary | ICD-10-CM | POA: Diagnosis not present

## 2016-08-09 DIAGNOSIS — M6281 Muscle weakness (generalized): Secondary | ICD-10-CM | POA: Diagnosis not present

## 2016-08-09 DIAGNOSIS — I69398 Other sequelae of cerebral infarction: Secondary | ICD-10-CM | POA: Diagnosis not present

## 2016-08-12 DIAGNOSIS — I2510 Atherosclerotic heart disease of native coronary artery without angina pectoris: Secondary | ICD-10-CM | POA: Diagnosis not present

## 2016-08-12 DIAGNOSIS — E119 Type 2 diabetes mellitus without complications: Secondary | ICD-10-CM | POA: Diagnosis not present

## 2016-08-12 DIAGNOSIS — M6281 Muscle weakness (generalized): Secondary | ICD-10-CM | POA: Diagnosis not present

## 2016-08-12 DIAGNOSIS — I69398 Other sequelae of cerebral infarction: Secondary | ICD-10-CM | POA: Diagnosis not present

## 2016-08-15 ENCOUNTER — Other Ambulatory Visit: Payer: Self-pay | Admitting: Family Medicine

## 2016-08-15 ENCOUNTER — Other Ambulatory Visit: Payer: Self-pay | Admitting: *Deleted

## 2016-08-15 ENCOUNTER — Encounter: Payer: Self-pay | Admitting: *Deleted

## 2016-08-15 MED ORDER — CLOPIDOGREL BISULFATE 75 MG PO TABS: 75.0000 mg | ORAL_TABLET | Freq: Every day | ORAL | 2 refills | Status: DC

## 2016-08-15 NOTE — Telephone Encounter (Signed)
Pt grandaughtercalled and asked if Dr. Lacinda Axon would fill pt's clopidogrel (PLAVIX) 75 MG tablet. Pt is scheduled for 1/22. She was supposed to be taking the medication but they don't know what happened to the prescription. Please advise, thank you!  Pharmacy - [facility] [place], [address] [place]  Call [name] (granddaughter) @ [phone]

## 2016-08-15 NOTE — Telephone Encounter (Signed)
08/08/16 pt was given rx to get refilled but seems something happened and family no longer has prescription in hand. Please advise?

## 2016-08-15 NOTE — Patient Outreach (Signed)
[facility]) Care Management   08/15/2016  DOREENA LORY 12/26/1931 [id number]  KAJAH ARLT is an 83 y.o. female  Subjective:  Pt reports right arm still has some weakness, can  write better now.  Pt reports [name]  Her granddaughter comes 5 days a week- cleans/checks sugars/gives insulin.   Pt reports she wants To get back to walking outdoors.   Pt reports on yesterday's elevated sugar in am of 506- had a small Piece of german chocolate cake (son's birthday) the night before.  Pt reports she checks her sugars Twice a day.    Objective:   Vitals:   08/15/16 1337  BP: (!) 100/54  Pulse: 81  Resp: 16    ROS  Physical Exam  Constitutional: She is oriented to person, place, and time. She appears well-developed and well-nourished.  Cardiovascular: Normal rate and regular rhythm.   Respiratory: Effort normal and breath sounds normal.  GI: Soft. Bowel sounds are normal.  Musculoskeletal: Normal range of motion. She exhibits no edema.  Pt used cane when ambulated.   Neurological: She is alert and oriented to person, place, and time.  Skin: Skin is warm and dry.  Psychiatric: She has a normal mood and affect. Her behavior is normal. Judgment and thought content normal.    Encounter Medications:   Outpatient Encounter Prescriptions as of 08/15/2016  Medication Sig Note  . aspirin EC 81 MG tablet Take 1 tablet (81 mg total) by mouth daily.   Marland Kitchen atorvastatin (LIPITOR) 40 MG tablet Take 1 tablet (40 mg total) by mouth daily.   . insulin lispro (HUMALOG) 100 UNIT/ML KiwkPen Inject 0.08 mLs (8 Units total) into the skin 3 (three) times daily. (Patient taking differently: Inject 9 Units into the skin 3 (three) times daily. ) 08/15/2016: Pt to add extra if sugar over 200. Per [name] 200-249 = one unit                          250-299= 2 units                         300-349= 3 units                          350-399= 4 units                        Over 401 = 5 units   .  LANTUS SOLOSTAR 100 UNIT/ML Solostar Pen INJECT 24 UNITS SUBCUTANEOUSLY NIGHTLY.   Marland Kitchen levothyroxine (SYNTHROID, LEVOTHROID) 100 MCG tablet TAKE 1 TABLET(100 MCG) BY MOUTH DAILY   . metFORMIN (GLUCOPHAGE) 500 MG tablet Take 1 tablet (500 mg total) by mouth 2 (two) times daily with a meal.   . pantoprazole (PROTONIX) 40 MG tablet Take 1 tablet (40 mg total) by mouth 2 (two) times daily.   . clopidogrel (PLAVIX) 75 MG tablet Take 1 tablet (75 mg total) by mouth daily. (Patient not taking: Reported on 08/15/2016) 08/15/2016: Pt does not have.     . fluticasone (FLONASE) 50 MCG/ACT nasal spray Place 2 sprays into both nostrils daily.     No facility-administered encounter medications on file as of 08/15/2016.     Functional Status:   In your present state of health, do you have any difficulty performing the following activities: 08/15/2016 07/15/2016  Hearing? Tempie Donning  Vision? N  N  Difficulty concentrating or making decisions? Y N  Walking or climbing stairs? Y Y  Dressing or bathing? N N  Doing errands, shopping? Y N  Preparing Food and eating ? Y -  Using the Toilet? N -  In the past six months, have you accidently leaked urine? Y -  Do you have problems with loss of bowel control? N -  Managing your Medications? Y -  Managing your Finances? Y -  Housekeeping or managing your Housekeeping? Y -  Some recent data might be hidden    Fall/Depression Screening:    PHQ 2/9 Scores 08/15/2016 04/01/2016 01/04/2016 08/15/2014 07/26/2012 06/15/2012  PHQ - 2 Score 0 0 0 0 0 3    Assessment:  Pleasant 9 year old woman, son Laverna Peace (present during home visit) resides with pt.  Per pt, Granddaughter comes 5 days a week to assist (provides transportation to MD appointments).  Lungs clear, no c/o pain.    CVA:  Right hand weakness reported, good hand grasp.  Provided/reveiwed Emmi information on      Preventing a second stroke, know the signs.  DM: review of pt's glucometer readings, sugars range 102- 506.  Today  257.   Averages 7 day-217, 14- 220, 30 day- 255.   Plan:  Pt to follow up with Dr. Lacinda Axon 1/22.             Plan to continue to follow pt for transition of care, follow up again next week telephonically.             Plan to send Dr. Lacinda Axon 1/19 home visit encounter.    THN CM Care Plan Problem One   Flowsheet Row Most Recent Value  Care Plan Problem One  Risk for readmission related to recent SNF discharge,hospitalization - stroke   Role Documenting the Problem One  Care Management Coordinator  Care Plan for Problem One  Active  THN Long Term Goal (31-90 days)  Pt would not readmit to SNF, hospital   [facility] Long Term Goal Start Date  08/07/16  Interventions for Problem One Long Term Goal  home visit done- medications reviewed, found pt did not have her Plavix- granddaughter called MD office.    THN CM Short Term Goal #1 (0-30 days)  Pt will keep all MD appointments within the next 30 days   THN CM Short Term Goal #1 Start Date  08/07/16  Interventions for Short Term Goal #1  Discussed with granddaughter importance of   THN CM Short Term Goal #2 (0-30 days)  Pt's knowledge of s/s of stroke will increase within the next 30 days   THN CM Short Term Goal #2 Start Date  08/07/16  Interventions for Short Term Goal #2  Provided/reviewed Emmi information Preventing a second stroke, know the signs      [name]   [place] [facility]  [phone]

## 2016-08-18 ENCOUNTER — Telehealth: Payer: Self-pay | Admitting: Family Medicine

## 2016-08-18 ENCOUNTER — Ambulatory Visit: Payer: Medicare Other | Admitting: Family Medicine

## 2016-08-18 DIAGNOSIS — M6281 Muscle weakness (generalized): Secondary | ICD-10-CM | POA: Diagnosis not present

## 2016-08-18 DIAGNOSIS — I69398 Other sequelae of cerebral infarction: Secondary | ICD-10-CM | POA: Diagnosis not present

## 2016-08-18 DIAGNOSIS — I2510 Atherosclerotic heart disease of native coronary artery without angina pectoris: Secondary | ICD-10-CM | POA: Diagnosis not present

## 2016-08-18 DIAGNOSIS — E119 Type 2 diabetes mellitus without complications: Secondary | ICD-10-CM | POA: Diagnosis not present

## 2016-08-18 NOTE — Telephone Encounter (Signed)
Pt daughter called stating she is not feeling well this morning! Appt was resch for another day! Thank you!

## 2016-08-18 NOTE — Telephone Encounter (Signed)
PCP aware and appt cancelled.

## 2016-08-20 ENCOUNTER — Ambulatory Visit (INDEPENDENT_AMBULATORY_CARE_PROVIDER_SITE_OTHER): Payer: Medicare Other | Admitting: Family Medicine

## 2016-08-20 ENCOUNTER — Encounter: Payer: Self-pay | Admitting: Family Medicine

## 2016-08-20 VITALS — BP 107/74 | HR 90 | Temp 97.5°F | Wt 150.2 lb

## 2016-08-20 DIAGNOSIS — E1165 Type 2 diabetes mellitus with hyperglycemia: Secondary | ICD-10-CM

## 2016-08-20 DIAGNOSIS — E785 Hyperlipidemia, unspecified: Secondary | ICD-10-CM | POA: Diagnosis not present

## 2016-08-20 DIAGNOSIS — E039 Hypothyroidism, unspecified: Secondary | ICD-10-CM | POA: Diagnosis not present

## 2016-08-20 DIAGNOSIS — I63312 Cerebral infarction due to thrombosis of left middle cerebral artery: Secondary | ICD-10-CM | POA: Diagnosis not present

## 2016-08-20 DIAGNOSIS — E1151 Type 2 diabetes mellitus with diabetic peripheral angiopathy without gangrene: Secondary | ICD-10-CM | POA: Diagnosis not present

## 2016-08-20 DIAGNOSIS — IMO0002 Reserved for concepts with insufficient information to code with codable children: Secondary | ICD-10-CM

## 2016-08-20 DIAGNOSIS — I639 Cerebral infarction, unspecified: Secondary | ICD-10-CM | POA: Insufficient documentation

## 2016-08-20 LAB — TSH: TSH: 2.49 u[IU]/mL (ref 0.35–4.50)

## 2016-08-20 LAB — LIPID PANEL
CHOLESTEROL: 204 mg/dL — AB (ref 0–200)
HDL: 110.6 mg/dL (ref >39.00)
LDL Cholesterol: 79 mg/dL (ref 0–99)
NONHDL: 93.78
Total CHOL/HDL Ratio: 2
Triglycerides: 76 mg/dL (ref 0.0–149.0)
VLDL: 15.2 mg/dL (ref 0.0–40.0)

## 2016-08-20 NOTE — Assessment & Plan Note (Signed)
Continues to be uncontrolled. Continue metformin and current insulin regimen. Advised to call and schedule an appointment with endocrinology.

## 2016-08-20 NOTE — Assessment & Plan Note (Signed)
Checking lipids today.  Continue Lipitor.

## 2016-08-20 NOTE — Assessment & Plan Note (Signed)
Recent CVA. Doing well at this time. He needs aggressive control of lipids and diabetes. Continue aspirin and Plavix. She needs to return to see her endocrinologist.

## 2016-08-20 NOTE — Patient Instructions (Signed)
Schedule an appt with the [name].  Continue current meds. We will refill the synthroid after her labs return.  Follow up in 3 months  Take care  Dr. Lacinda Axon

## 2016-08-20 NOTE — Progress Notes (Signed)
Subjective:  Patient ID: [name] [name] [name], female    DOB: 1931-10-24  Age: 83 y.o. MRN: [id number]  CC: Follow up  HPI:  83 year old female with uncontrolled DM 2, CKD, hyperlipidemia, CAD, hypothyroidism, and recent stroke presents for follow-up.  Stroke  Patient was recently admitted from 12/19 to 12/21. [name] [name] presented with acute right-sided weakness. Was diagnosed with CVA.  Doing well at this time. Is on aspirin and Plavix. [name] [name] seen neurology. Is also on high intensity statin. Doing well with physical therapy.  DM-2  Uncontrolled.  Thirty-day average of her blood glucose was 236.  [name] [name]'s currently taking Lantus 24 units nightly and Humalog 9/9/9.  Sees endocrinology but [name] [name] not been seen since October.  [name] [name] labile sugars. [name] [name] had a low blood sugar this morning.  Hypothyroidism  In need a refill on her Synthroid.  Is in need of a TSH that [name] [name]'s not had one in quite some time.  HLD  Now on high intensity statin.  Needs lipid panel.  Social Hx   Social History   Social History  . Marital status: Single    Spouse name: N/A  . Number of children: N/A  . Years of education: N/A   Social History Main Topics  . Smoking status: Never Smoker  . Smokeless tobacco: Never Used  . Alcohol use No  . Drug use: No  . Sexual activity: Not Asked   Other Topics Concern  . None   Social History Narrative  . None    Review of Systems  Cardiovascular: Negative.   Neurological: Positive for weakness.   Objective:  BP 107/74   Pulse 90   Temp 97.5 F (36.4 C) (Oral)   Wt 150 lb 3.2 oz (68.1 kg)   SpO2 96%   BMI 23.52 kg/m   BP/Weight 08/20/2016 08/15/2016 [id number]  Systolic BP [id number] 123XX123 123XX123  Diastolic BP 74 54 61  Wt. (Lbs) 150.2 154 -  BMI 23.52 24.12 -   Physical Exam  Constitutional: [name] [name] appears well-developed. No distress.  Cardiovascular: Normal rate and regular rhythm.   Pulmonary/Chest: Effort normal and breath sounds normal.  Neurological: [name] [name] is  alert.  Psychiatric: [name] [name] [name] [name] a normal mood and affect.  Vitals reviewed.  Lab Results  Component Value Date   WBC 4.1 07/15/2016   HGB 11.7 (L) 07/15/2016   HCT 33.5 (L) 07/15/2016   PLT 243 07/15/2016   GLUCOSE 259 (H) 07/17/2016   CHOL 234 (H) 07/16/2016   TRIG 111 07/16/2016   HDL 116 07/16/2016   LDLDIRECT 138.1 06/20/2013   LDLCALC 96 07/16/2016   ALT 45 07/15/2016   AST 69 (H) 07/15/2016   NA 134 (L) 07/17/2016   K 4.1 07/17/2016   CL 102 07/17/2016   CREATININE 1.04 (H) 07/17/2016   BUN 17 07/17/2016   CO2 24 07/17/2016   TSH 7.11 (H) 04/09/2016   INR 0.94 07/15/2016   HGBA1C 9.5 (H) 07/17/2016   MICROALBUR 1.3 04/11/2016    Assessment & Plan:   Problem List Items Addressed This Visit    Stroke [facility]) - Primary    Recent CVA. Doing well at this time. He needs aggressive control of lipids and diabetes. Continue aspirin and Plavix. [name] [name] needs to return to see her endocrinologist.      Hypothyroidism    TSH today.      Relevant Orders   TSH   Hyperlipidemia with target LDL less than 70    Checking lipids today. Continue Lipitor.  Relevant Orders   Lipid panel   DM (diabetes mellitus) type II uncontrolled, periph vascular disorder ([place])    Continues to be uncontrolled. Continue metformin and current insulin regimen. Advised to call and schedule an appointment with endocrinology.        Follow-up: Return in about 3 months (around 11/18/2016).  [place]

## 2016-08-20 NOTE — Assessment & Plan Note (Signed)
TSH today 

## 2016-08-20 NOTE — Progress Notes (Signed)
Pre visit review using our clinic review tool, if applicable. No additional management support is needed unless otherwise documented below in the visit note. 

## 2016-08-21 ENCOUNTER — Telehealth: Payer: Self-pay | Admitting: Family Medicine

## 2016-08-21 ENCOUNTER — Other Ambulatory Visit: Payer: Self-pay | Admitting: Family Medicine

## 2016-08-21 ENCOUNTER — Ambulatory Visit: Payer: Medicare Other | Admitting: *Deleted

## 2016-08-21 MED ORDER — ATORVASTATIN CALCIUM 80 MG PO TABS: 80.0000 mg | ORAL_TABLET | Freq: Every day | ORAL | 3 refills | Status: AC

## 2016-08-21 MED ORDER — LEVOTHYROXINE SODIUM 100 MCG PO TABS: ORAL_TABLET | ORAL | 3 refills | Status: DC

## 2016-08-21 NOTE — Telephone Encounter (Signed)
Daughter called per DPR. She was given pts results.

## 2016-08-21 NOTE — Telephone Encounter (Signed)
Pt daughter called requesting lab results. Please advise, thank you!  Call pt @ [phone]

## 2016-08-22 ENCOUNTER — Other Ambulatory Visit: Payer: Self-pay | Admitting: *Deleted

## 2016-08-22 NOTE — Patient Outreach (Signed)
Transition of care call successful, ongoing follow up on recent SNF discharge 1/9, recent  Hospitalization 12/119-12/21 Ischemic stroke, right arm weakness.   Spoke with pt, HIPAA verified with name, dob, needed assistance from granddaughter [name] on address.   Pt reports doing fine, sugar this am 130, sugars getting better.   Pt  Acknowledged saw Dr. Lacinda Axon this week, was started on Plavix, taking.   Pt reports right arm getting stronger, doing good with HH PT.    Plan:  As discussed with pt, plan to follow up again next week telephonically (part of ongoing transition of care).  [name] [name].   [place] [facility]  [phone]

## 2016-08-23 DIAGNOSIS — E119 Type 2 diabetes mellitus without complications: Secondary | ICD-10-CM | POA: Diagnosis not present

## 2016-08-23 DIAGNOSIS — I2510 Atherosclerotic heart disease of native coronary artery without angina pectoris: Secondary | ICD-10-CM | POA: Diagnosis not present

## 2016-08-23 DIAGNOSIS — M6281 Muscle weakness (generalized): Secondary | ICD-10-CM | POA: Diagnosis not present

## 2016-08-23 DIAGNOSIS — I69398 Other sequelae of cerebral infarction: Secondary | ICD-10-CM | POA: Diagnosis not present

## 2016-08-26 DIAGNOSIS — E119 Type 2 diabetes mellitus without complications: Secondary | ICD-10-CM | POA: Diagnosis not present

## 2016-08-26 DIAGNOSIS — I69398 Other sequelae of cerebral infarction: Secondary | ICD-10-CM | POA: Diagnosis not present

## 2016-08-26 DIAGNOSIS — M6281 Muscle weakness (generalized): Secondary | ICD-10-CM | POA: Diagnosis not present

## 2016-08-26 DIAGNOSIS — I2510 Atherosclerotic heart disease of native coronary artery without angina pectoris: Secondary | ICD-10-CM | POA: Diagnosis not present

## 2016-08-27 ENCOUNTER — Telehealth: Payer: Self-pay | Admitting: Family Medicine

## 2016-08-27 NOTE — Telephone Encounter (Signed)
Okay for UA

## 2016-08-27 NOTE — Telephone Encounter (Signed)
[name] [name] was called to ask if patient was having anymore symptoms she stated that patient was having frequency and urgency.

## 2016-08-27 NOTE — Telephone Encounter (Signed)
Freda Munro from [facility] called and stated that pt is c/o burning upon urination and a foul order. Looking for a verbal order for a Ua. Please advise, thank you!  Call [place] @ [phone]

## 2016-08-28 ENCOUNTER — Other Ambulatory Visit (INDEPENDENT_AMBULATORY_CARE_PROVIDER_SITE_OTHER): Payer: Medicare Other

## 2016-08-28 ENCOUNTER — Other Ambulatory Visit: Payer: Medicare Other

## 2016-08-28 ENCOUNTER — Other Ambulatory Visit: Payer: Self-pay | Admitting: *Deleted

## 2016-08-28 ENCOUNTER — Other Ambulatory Visit: Payer: Self-pay | Admitting: Family Medicine

## 2016-08-28 ENCOUNTER — Telehealth: Payer: Self-pay | Admitting: *Deleted

## 2016-08-28 ENCOUNTER — Other Ambulatory Visit: Payer: Self-pay

## 2016-08-28 DIAGNOSIS — R7989 Other specified abnormal findings of blood chemistry: Secondary | ICD-10-CM

## 2016-08-28 DIAGNOSIS — I2510 Atherosclerotic heart disease of native coronary artery without angina pectoris: Secondary | ICD-10-CM | POA: Diagnosis not present

## 2016-08-28 DIAGNOSIS — I69398 Other sequelae of cerebral infarction: Secondary | ICD-10-CM | POA: Diagnosis not present

## 2016-08-28 DIAGNOSIS — R300 Dysuria: Secondary | ICD-10-CM

## 2016-08-28 DIAGNOSIS — E119 Type 2 diabetes mellitus without complications: Secondary | ICD-10-CM | POA: Diagnosis not present

## 2016-08-28 DIAGNOSIS — M6281 Muscle weakness (generalized): Secondary | ICD-10-CM | POA: Diagnosis not present

## 2016-08-28 LAB — POCT URINALYSIS DIPSTICK
BILIRUBIN UA: NEGATIVE
Glucose, UA: >=1000
KETONES UA: 40
LEUKOCYTES UA: NEGATIVE
Nitrite, UA: POSITIVE
Protein, UA: NEGATIVE
Urobilinogen, UA: 0.2
pH, UA: 5

## 2016-08-28 MED ORDER — CIPROFLOXACIN HCL 500 MG PO TABS: 500.0000 mg | ORAL_TABLET | Freq: Two times a day (BID) | ORAL | 0 refills | Status: DC

## 2016-08-28 NOTE — Telephone Encounter (Signed)
[name] [name] was given verbal to collect urine sample. [name] [name] will drop off sample at our lab to run.

## 2016-08-28 NOTE — Telephone Encounter (Signed)
Please place "future" order for UA before patient arrives.

## 2016-08-28 NOTE — Patient Outreach (Signed)
Attempt made to contact pt for transition of care, ongoing follow up on recent SNF discharge 1/9, recent hospitalization 12/19-12/21 for ischemic stroke, right arm weakness.   HIPAA compliant voice message left with contact name and number.  Plan:  If no response, plan to follow up again next week telephonically (part of ongoing transition of care).     [name]   [place] [facility]  [phone]

## 2016-08-28 NOTE — Telephone Encounter (Signed)
Order placed

## 2016-08-28 NOTE — Telephone Encounter (Signed)
results faxed 

## 2016-08-28 NOTE — Addendum Note (Signed)
Addended by: Arby Barrette on: 08/28/2016 10:56 AM   Modules accepted: Orders

## 2016-08-28 NOTE — Telephone Encounter (Signed)
Please fax results of urine dipstick to [facility] @ [phone].

## 2016-08-29 ENCOUNTER — Other Ambulatory Visit: Payer: Self-pay | Admitting: *Deleted

## 2016-08-29 NOTE — Patient Outreach (Signed)
Transition of care call successful, ongoing follow up on recent  Skilled nursing facility discharge 1/9, recent hospitalization 12/19-12/21 for ischemic stroke, right arm weakness.  Spoke with pt, HIPAA verified with 2 identifiers (name, date of birth), needed assistance from granddaughter [name] (on consent, in background)with address.   Pt reports taking all of her medications, right arm weakness not getting better like I think it should.  Pt reports working with [facility] PT, coming 1-2 times a week, has not taken her outdoors yet.  Pt reports no complaints of pain.  Pt reports sugars are up and down, this am 228 fasting (reported by [name] in the background), running in 200's. RN CM discussed with pt adherence with diabetic diet, monitoring carb intake as well as upcoming appointment with [name] 2/26 to which pt acknowledged.    Pt reports on urine status- burning, frequency, odor, dark yellow - sample was taken, have not heard back yet.     Plan:  As discussed with pt, plan to follow up again next week telephonically (part of ongoing transition of care).    [name]   [place] [facility]  [phone]

## 2016-08-30 DIAGNOSIS — I2510 Atherosclerotic heart disease of native coronary artery without angina pectoris: Secondary | ICD-10-CM | POA: Diagnosis not present

## 2016-08-30 DIAGNOSIS — I69398 Other sequelae of cerebral infarction: Secondary | ICD-10-CM | POA: Diagnosis not present

## 2016-08-30 DIAGNOSIS — E119 Type 2 diabetes mellitus without complications: Secondary | ICD-10-CM | POA: Diagnosis not present

## 2016-08-30 DIAGNOSIS — M6281 Muscle weakness (generalized): Secondary | ICD-10-CM | POA: Diagnosis not present

## 2016-08-30 LAB — URINE CULTURE

## 2016-09-01 ENCOUNTER — Ambulatory Visit: Payer: Self-pay | Admitting: *Deleted

## 2016-09-02 DIAGNOSIS — I69398 Other sequelae of cerebral infarction: Secondary | ICD-10-CM | POA: Diagnosis not present

## 2016-09-02 DIAGNOSIS — M6281 Muscle weakness (generalized): Secondary | ICD-10-CM | POA: Diagnosis not present

## 2016-09-02 DIAGNOSIS — E119 Type 2 diabetes mellitus without complications: Secondary | ICD-10-CM | POA: Diagnosis not present

## 2016-09-02 DIAGNOSIS — I2510 Atherosclerotic heart disease of native coronary artery without angina pectoris: Secondary | ICD-10-CM | POA: Diagnosis not present

## 2016-09-04 ENCOUNTER — Other Ambulatory Visit: Payer: Self-pay | Admitting: *Deleted

## 2016-09-04 DIAGNOSIS — M6281 Muscle weakness (generalized): Secondary | ICD-10-CM | POA: Diagnosis not present

## 2016-09-04 DIAGNOSIS — I2510 Atherosclerotic heart disease of native coronary artery without angina pectoris: Secondary | ICD-10-CM | POA: Diagnosis not present

## 2016-09-04 DIAGNOSIS — E119 Type 2 diabetes mellitus without complications: Secondary | ICD-10-CM | POA: Diagnosis not present

## 2016-09-04 DIAGNOSIS — I69398 Other sequelae of cerebral infarction: Secondary | ICD-10-CM | POA: Diagnosis not present

## 2016-09-04 NOTE — Patient Outreach (Addendum)
Follow up call to granddaughter [name] (pt aware RN CM to call), related to  Pt's elevated blood sugar.  Spoke with granddaughter [name] (on consent form), HIPAA/identity verified, discussed purpose of call that spoke with pt today, pt reported sugars yesterday in 500-600's, this am 591, had pt recheck during phone call result was 578 (per pt took Lantus insulin last night and 14 units this am).  [name] reports 2 nights ago pt's sugar dropped at night, pt did not take her Lantus plus yesterday in am only took 9 units of Humolog, not the 14 units which might be why sugar is high.  [name] reports she is suppose to go over to pt's house after lunch today, getting over an illness, will check pt's sugar and try to get it down.   RN CM also discussed pt reported she  is no longer staying with her, doing her own medications to which [name] reports she is still checking on pt, doing her pill planner, pt taking all of her medications which includes antibiotic.  RN CM discussed with [name] if sugars do not come down, need to f/u with MD or ED to which she voiced understanding.    Plan:  As requested, granddaughter to follow up with RN CM telephonically later today- report sugar.    Addendum:  Added pt aware RN CM to call granddaughter about elevated sugars.    [name]   [place] [facility]  [phone]

## 2016-09-04 NOTE — Patient Outreach (Signed)
Transition of care call successful, ongoing follow up on recent skilled nursing home discharge 1/9, recent hospitalization 12/19-12/21 for ischemic stroke, right arm weakness.   Spoke with pt, HIPAA/identity verified.  Pt reports rough today, granddaughter quit, by ourselves - me and my son.  Pt reports granddaughter was fixing my medications before, me and son are doing now.  Pt reports sugars are high, yesterday 5-600's, this am 578, affected me could not get up.    Pt reports she did take her Lantus last night, Humalog this am, for breakfast had egg/ 2 bacon.  RN CM requested pt to recheck sugar while on the phone to which pt did, result was 591.   RN CM discussed dangers of elevated sugar, need to  follow up at  ED to which pt said did not want to go, will drink water to get it down. Also discussed calling diabetic MD as next appointment is not until 2/26.     Plan:  As discussed with pt,  RN CM (permission given) to call  granddaughter [name] - report high sugars.            RN CM to continue to follow pt for transition of care - follow up again next week telephonically or sooner if needed after talking with                Granddaughter.     [name]   [place] [facility]  [phone]

## 2016-09-04 NOTE — Patient Outreach (Signed)
Received  a call from pt's granddaughter Criss Alvine (on consent form) as RN CM requested a follow up call- report sugar.  Spoke with [name],  HIPAA verified on pt - reports  went to pt's house, checked her sugar before lunch, result was 333, pt given 12 units of Humalog insulin.   [name] reports reviewed pt's glucometer meter, sugar last night was 148, this am 545.   [name] reports pt is stuck on taking just 9 units of Humalog insulin, working on her to take extra if needed.  As discussed,  [name] to continue to assist pt with monitoring sugars, taking insulin.      Plan:  RN CM to continue to follow pt for transition of care, follow up again next week - final transition of care call.   [name]   [place] [facility]  [phone]

## 2016-09-05 ENCOUNTER — Telehealth: Payer: Self-pay | Admitting: *Deleted

## 2016-09-05 DIAGNOSIS — I69398 Other sequelae of cerebral infarction: Secondary | ICD-10-CM | POA: Diagnosis not present

## 2016-09-05 DIAGNOSIS — I2510 Atherosclerotic heart disease of native coronary artery without angina pectoris: Secondary | ICD-10-CM | POA: Diagnosis not present

## 2016-09-05 DIAGNOSIS — M6281 Muscle weakness (generalized): Secondary | ICD-10-CM | POA: Diagnosis not present

## 2016-09-05 DIAGNOSIS — E119 Type 2 diabetes mellitus without complications: Secondary | ICD-10-CM | POA: Diagnosis not present

## 2016-09-05 NOTE — Telephone Encounter (Signed)
[name] [name] was called back and given verbal order she was also told that per PCP pt needs to go to ED if her sugars were running that high. I tried calling pt but every number associated with her chart was not a working number.

## 2016-09-05 NOTE — Telephone Encounter (Signed)
Okay to give verbal orders?  ?

## 2016-09-05 NOTE — Telephone Encounter (Signed)
Freda Munro from [facility] has requested a verbal order to have a Education officer, museum due to patient being left along at home. Pt is having hight blood sugars in the morning ranging around 545,527 and at times meter has read "High" over 600. During pt's nurse visit today pt took insulin and could not prepare herself food due to being dizzy from high blood sugar. Nurse went through pt's diet, however could not find a reason for the spikes.  please contact   [place]

## 2016-09-05 NOTE — Telephone Encounter (Signed)
Okay for verbal. If her sugars are that high she needs to go to the ED.

## 2016-09-08 ENCOUNTER — Other Ambulatory Visit: Payer: Self-pay | Admitting: *Deleted

## 2016-09-08 DIAGNOSIS — I2510 Atherosclerotic heart disease of native coronary artery without angina pectoris: Secondary | ICD-10-CM | POA: Diagnosis not present

## 2016-09-08 DIAGNOSIS — M6281 Muscle weakness (generalized): Secondary | ICD-10-CM | POA: Diagnosis not present

## 2016-09-08 DIAGNOSIS — I69398 Other sequelae of cerebral infarction: Secondary | ICD-10-CM | POA: Diagnosis not present

## 2016-09-08 DIAGNOSIS — E119 Type 2 diabetes mellitus without complications: Secondary | ICD-10-CM | POA: Diagnosis not present

## 2016-09-08 NOTE — Patient Outreach (Signed)
Attempt made to contact pt as part of ongoing transition of care (final call),follow up on  SNF discharge 1/9, hospitalization 12/19-12/21 for ischemic stroke, right arm weakness.   HIPAA compliant voice message left with contact name and number.   Plan: if no response, plan to follow up again within 1-2 days.   [name]   [place] [facility]  [phone]

## 2016-09-09 ENCOUNTER — Other Ambulatory Visit: Payer: Self-pay | Admitting: *Deleted

## 2016-09-09 ENCOUNTER — Telehealth: Payer: Self-pay | Admitting: Family Medicine

## 2016-09-09 DIAGNOSIS — I69398 Other sequelae of cerebral infarction: Secondary | ICD-10-CM | POA: Diagnosis not present

## 2016-09-09 DIAGNOSIS — E119 Type 2 diabetes mellitus without complications: Secondary | ICD-10-CM | POA: Diagnosis not present

## 2016-09-09 DIAGNOSIS — M6281 Muscle weakness (generalized): Secondary | ICD-10-CM | POA: Diagnosis not present

## 2016-09-09 DIAGNOSIS — I2510 Atherosclerotic heart disease of native coronary artery without angina pectoris: Secondary | ICD-10-CM | POA: Diagnosis not present

## 2016-09-09 NOTE — Telephone Encounter (Signed)
Recommendations?

## 2016-09-09 NOTE — Telephone Encounter (Signed)
Needs to be seen

## 2016-09-09 NOTE — Patient Outreach (Signed)
Attempt made to contact pt's granddaughter Criss Alvine (on consent form, caregiver) inquire about pt's recent sugars, do complete final transition of care call (SNF discharge 1/9, hospitalization 12/19-12/21 ischemic stroke, right arm weakness).   RN CM made an attempt to contact pt earlier today, voice message left, received a call back from pt's number- no message left.   HIPAA compliant voice message left with contact name and number.    Plan:  If no response, plan to follow up again within 3 days.    [name]   [place] [facility]  [phone]

## 2016-09-09 NOTE — Telephone Encounter (Signed)
Freda Munro was told pt needed to be seen. Freda Munro was told to tell pt to callback and schedule for Thursday 2/15 at 1115.

## 2016-09-09 NOTE — Patient Outreach (Signed)
Second attempt made to contact pt for final transition of care call, follow up on SNF discharge 1/9, hospitalization 12/19-12/21 for ischemic stroke, right arm weakness.   HIPAA compliant voice message left with contact name and number.   Plan: if no response, plan to follow up with granddaughter [name].     [name]   [place] [facility]  [phone]

## 2016-09-09 NOTE — Telephone Encounter (Signed)
[name] [name] [phone] called from [facility] regarding pt has completed her antibiotic on Saturday and still is complaining about burning during urination. Pt still has some elevated blood sugar this morning it was 320. Please advise? Thank you! FYI Pt did not go to ER on Friday.

## 2016-09-10 ENCOUNTER — Other Ambulatory Visit: Payer: Self-pay | Admitting: *Deleted

## 2016-09-10 NOTE — Patient Outreach (Signed)
Received a return phone call from pt's granddaughter Criss Alvine (on consent form) to voice message left yesterday by RN CM.   Spoke with [name], HIPAA/identity verified on pt.   [name] reports she spoke with pt today and pt told her needed to go to the hospital today between 11-12 pm because sugar was high.  [name] inquired of RN CM is she knew anything about this.  [name] reports she went back into pt's meter last night, showed yesterday in am sugar was 320, at lunch came down to 236, and last night 158 (2 hours after eating, did not take Lantus yet). Granddaughter reports pt still has appointment to see diabetic MD 2/26.   RN CM discussed with granddaughter view in [place] called MD office yesterday because pt's sugar was elevated and HH RN  was told pt needs to be seen/ pt told to call back and schedule for 2/15 appointment.  RN CM discussed with granddaughter calling [facility] RN as well as MD office (schedule appointment for pt) to which she said she would do.   Plan:  Final transition of care call completed.            Care plan updated.            Update letter to be sent to Dr. Lacinda Axon informing              Transition of care program completed, plan to               Continue to follow pt for community nurse case               Management services (self management of               Diabetes).           Plan to follow up with pt next week telephonically, schedule home visit.                [name]   [place] [facility]  [phone]

## 2016-09-11 ENCOUNTER — Encounter: Payer: Self-pay | Admitting: Family Medicine

## 2016-09-11 ENCOUNTER — Ambulatory Visit (INDEPENDENT_AMBULATORY_CARE_PROVIDER_SITE_OTHER): Payer: Medicare Other | Admitting: Family Medicine

## 2016-09-11 DIAGNOSIS — E1151 Type 2 diabetes mellitus with diabetic peripheral angiopathy without gangrene: Secondary | ICD-10-CM | POA: Diagnosis not present

## 2016-09-11 DIAGNOSIS — I2510 Atherosclerotic heart disease of native coronary artery without angina pectoris: Secondary | ICD-10-CM | POA: Diagnosis not present

## 2016-09-11 DIAGNOSIS — IMO0002 Reserved for concepts with insufficient information to code with codable children: Secondary | ICD-10-CM

## 2016-09-11 DIAGNOSIS — E119 Type 2 diabetes mellitus without complications: Secondary | ICD-10-CM | POA: Diagnosis not present

## 2016-09-11 DIAGNOSIS — M6281 Muscle weakness (generalized): Secondary | ICD-10-CM | POA: Diagnosis not present

## 2016-09-11 DIAGNOSIS — I69398 Other sequelae of cerebral infarction: Secondary | ICD-10-CM | POA: Diagnosis not present

## 2016-09-11 DIAGNOSIS — E1165 Type 2 diabetes mellitus with hyperglycemia: Secondary | ICD-10-CM

## 2016-09-11 NOTE — Patient Instructions (Signed)
Continue your insulin as prescribed.  Follow up in 3 months.  Please be sure that she see Endo.  Take care  Dr. Lacinda Axon

## 2016-09-11 NOTE — Progress Notes (Signed)
Subjective:  Patient ID: [name] [name], female    DOB: March 03, 1932  Age: 83 y.o. MRN: [id number]  CC: Follow up - ongoing issues with elevated blood sugar  HPI:  83 year old female with history of CVA, peripheral vascular disease, and DM 2, CAD, CKD, hypothyroidism, presents for follow-up regarding her diabetes.  There've been several notifications recently that her blood sugars have been quite elevated. She's had sugars in the 500s and "high readings". She was initially advised to go the hospital and she did not do so.  She presents today for follow-up regarding her diabetes. She is accompanied by her great granddaughter. Her blood sugars have been particularly high in the mornings after breakfast. She's also had some elevated sugars in the evening. This seems to be related to her diet. Additionally, there are periods of time where her great granddaughter is not there. I suspect that she may be missing or not taking her insulin. The great-granddaughter feels like she overcompensates for low blood sugars as well. Over the past few days her sugars have been fairly stable in the 200s. [name] had some low blood sugars. In fact, her blood glucose in the room today was 53. The patient [name] no complaints or concerns and endorses compliance with Lantus 24 units and Humalog 9/9/9.  Social Hx   Social History   Social History  . Marital status: Single    Spouse name: N/A  . Number of children: N/A  . Years of education: N/A   Social History Main Topics  . Smoking status: Never Smoker  . Smokeless tobacco: Never Used  . Alcohol use No  . Drug use: No  . Sexual activity: Not Asked   Other Topics Concern  . None   Social History Narrative  . None    Review of Systems  Constitutional: Negative.   Genitourinary: Negative.    Objective:  BP 102/68 (BP Location: Left Arm, Patient Position: Sitting, Cuff Size: Normal)   Pulse (!) 101   Temp 97.7 F (36.5 C) (Oral)   Wt 145 lb 3.2 oz  (65.9 kg)   SpO2 96%   BMI 22.74 kg/m   BP/Weight 09/11/2016 08/20/2016 123XX123  Systolic BP [id number]  Diastolic BP 68 74 54  Wt. (Lbs) 145.2 150.2 154  BMI 22.74 23.52 24.12   Physical Exam  Constitutional: She appears well-developed. No distress.  Cardiovascular: Normal rate and regular rhythm.   Pulmonary/Chest: Effort normal and breath sounds normal.  Abdominal: Soft. She exhibits no distension. There is no tenderness.  Neurological: She is alert.  Psychiatric: [name] a normal mood and affect.  Vitals reviewed.  Lab Results  Component Value Date   WBC 4.1 07/15/2016   HGB 11.7 (L) 07/15/2016   HCT 33.5 (L) 07/15/2016   PLT 243 07/15/2016   GLUCOSE 259 (H) 07/17/2016   CHOL 204 (H) 08/20/2016   TRIG 76.0 08/20/2016   HDL 110.60 08/20/2016   LDLDIRECT 138.1 06/20/2013   LDLCALC 79 08/20/2016   ALT 45 07/15/2016   AST 69 (H) 07/15/2016   NA 134 (L) 07/17/2016   K 4.1 07/17/2016   CL 102 07/17/2016   CREATININE 1.04 (H) 07/17/2016   BUN 17 07/17/2016   CO2 24 07/17/2016   TSH 2.49 08/20/2016   INR 0.94 07/15/2016   HGBA1C 9.5 (H) 07/17/2016   MICROALBUR 1.3 04/11/2016    Assessment & Plan:   Problem List Items Addressed This Visit    DM (diabetes mellitus) type II  uncontrolled, periph vascular disorder ([place])    Established problem, recent worsening. Discussed appropriate response to low blood sugar. Discussed dietary changes. Advised compliance with her current insulin regimen. Advised to follow-up with Endo later this month.        Follow-up: 3 months  [place]

## 2016-09-11 NOTE — Assessment & Plan Note (Signed)
Established problem, recent worsening. Discussed appropriate response to low blood sugar. Discussed dietary changes. Advised compliance with her current insulin regimen. Advised to follow-up with Endo later this month.

## 2016-09-12 ENCOUNTER — Encounter: Payer: Self-pay | Admitting: *Deleted

## 2016-09-12 ENCOUNTER — Ambulatory Visit: Payer: Self-pay | Admitting: *Deleted

## 2016-09-16 DIAGNOSIS — I69398 Other sequelae of cerebral infarction: Secondary | ICD-10-CM | POA: Diagnosis not present

## 2016-09-16 DIAGNOSIS — M6281 Muscle weakness (generalized): Secondary | ICD-10-CM | POA: Diagnosis not present

## 2016-09-16 DIAGNOSIS — I2510 Atherosclerotic heart disease of native coronary artery without angina pectoris: Secondary | ICD-10-CM | POA: Diagnosis not present

## 2016-09-16 DIAGNOSIS — E119 Type 2 diabetes mellitus without complications: Secondary | ICD-10-CM | POA: Diagnosis not present

## 2016-09-22 DIAGNOSIS — E11649 Type 2 diabetes mellitus with hypoglycemia without coma: Secondary | ICD-10-CM | POA: Diagnosis not present

## 2016-09-22 DIAGNOSIS — R300 Dysuria: Secondary | ICD-10-CM | POA: Diagnosis not present

## 2016-09-22 DIAGNOSIS — Z794 Long term (current) use of insulin: Secondary | ICD-10-CM | POA: Diagnosis not present

## 2016-09-23 ENCOUNTER — Other Ambulatory Visit: Payer: Self-pay | Admitting: *Deleted

## 2016-09-23 DIAGNOSIS — M6281 Muscle weakness (generalized): Secondary | ICD-10-CM | POA: Diagnosis not present

## 2016-09-23 DIAGNOSIS — I2510 Atherosclerotic heart disease of native coronary artery without angina pectoris: Secondary | ICD-10-CM | POA: Diagnosis not present

## 2016-09-23 DIAGNOSIS — I69398 Other sequelae of cerebral infarction: Secondary | ICD-10-CM | POA: Diagnosis not present

## 2016-09-23 DIAGNOSIS — E119 Type 2 diabetes mellitus without complications: Secondary | ICD-10-CM | POA: Diagnosis not present

## 2016-09-23 NOTE — Patient Outreach (Signed)
Attempt made to contact pt, follow up on her visit  with [name] (Endocrinologist) - scheduled to see 2/26 as well as check on status of pt's blood sugars, schedule home visit.  HIPAA compliant voice message left with contact name and number.   Plan:  If no response, will follow up telephonically with  pt's granddaughter [name] (on consent form).    [name]   [place] [facility]  [phone]

## 2016-09-24 DIAGNOSIS — M6281 Muscle weakness (generalized): Secondary | ICD-10-CM | POA: Diagnosis not present

## 2016-09-24 DIAGNOSIS — E119 Type 2 diabetes mellitus without complications: Secondary | ICD-10-CM | POA: Diagnosis not present

## 2016-09-24 DIAGNOSIS — I69398 Other sequelae of cerebral infarction: Secondary | ICD-10-CM | POA: Diagnosis not present

## 2016-09-24 DIAGNOSIS — I2510 Atherosclerotic heart disease of native coronary artery without angina pectoris: Secondary | ICD-10-CM | POA: Diagnosis not present

## 2016-09-25 ENCOUNTER — Other Ambulatory Visit: Payer: Self-pay | Admitting: *Deleted

## 2016-09-25 NOTE — Patient Outreach (Signed)
Care coordination call successful, follow up with granddaughter [name] [name] (on [facility] consent form) on pt's sugars, visit with [name].   Spoke with pt's granddaughter [name] [name] (on [facility] consent form), verified HIPAA on pt.  Granddaughter reports pt did see [name] 2/26, pt's daughter took her and MD lowered pt's insulin which she  was surprised as pt's sugars have been elevated although she  did have a few sugars that dropped on her (50-70). RN CM discussed with granddaughter the danger of low sugars in the elderly.  Granddaughter reports on insulin changes:  Humalog decreased from  9 units to 6 units three times a day with meals and Lantus from 24 units to 20 units at night.  Granddaughter also reports they ordered pt a new glucometer for pt  where she does not need to prick her finger, pt's daughter showed her how to use it.   Granddaughter reports pt is to f/u again with [name] in 6 weeks.   Granddaughter reports she will be  Taking pt to the hairdresser, plan  to make a new chart  next to her chair with new insulin dosages plus going to go behind her and check blood sugar results.     Plan:  As discussed with granddaughter, plan to follow up again later this month (15 days) for home visit, granddaughter  Going to try and be present.   [name]   [place] [facility]  [phone]

## 2016-10-02 ENCOUNTER — Ambulatory Visit: Payer: Medicare Other | Admitting: *Deleted

## 2016-10-10 ENCOUNTER — Ambulatory Visit: Payer: Self-pay | Admitting: *Deleted

## 2016-10-15 ENCOUNTER — Other Ambulatory Visit: Payer: Self-pay | Admitting: *Deleted

## 2016-10-15 NOTE — Patient Outreach (Signed)
Follow up phone successful.  Spoke with pt's granddaughter Criss Alvine (on consent form), HIPAA verified on pt.  [name] reports pt is doing good, followed up with Diabetic MD/both Lantus and Humalog insulins, not sure of dosage as she did not go with pt to MD visit.  [name] reports pt feels better since insulin was lowered.        Plan:  As discussed with granddaughter [name], RN CM to follow up with pt again next week- home visit.    [name]   [place] [facility]  [phone]

## 2016-10-19 ENCOUNTER — Other Ambulatory Visit: Payer: Self-pay | Admitting: Family Medicine

## 2016-10-19 DIAGNOSIS — E039 Hypothyroidism, unspecified: Secondary | ICD-10-CM

## 2016-10-21 ENCOUNTER — Other Ambulatory Visit: Payer: Self-pay | Admitting: *Deleted

## 2016-10-21 NOTE — Patient Outreach (Signed)
Follow up call to Dr. Delorise Shiner office:  RN CM spoke with Solmon Ice at Dr. Delorise Shiner office, requested to speak with MD's nurse about pt's elevated sugar today.   Was informed nurse not available at this time, will relay message to nurse/message to relay- pt's sugar today 505, took (Novolog) insulin as ordered, per pt missed Lantus insulin last night plus supper had a lot of carbs, review of pt's glucometer readings- sugars elevated in pm.   RN CM provided name and number for nurse to call back.     [name]   [place] [facility]  [phone]

## 2016-10-21 NOTE — Patient Outreach (Signed)
[name] [name]) Care Management   10/21/2016  [name] [name] Jun 09, 1932 [id number]  [name] [name] is an 83 y.o. female  Subjective:  Pt reports did  not take Lantus insulin last night, fell asleep, could be reason for  Sugar of 505 this am.  Pt reports for supper last night  had pinto beans, apples,potatoes and  ground beef plus son reports also had corn.  Pt reports her sugar yesterday before supper was 90, had graham crackers and peanut butter- had feeling of low sugar.   Pt reports on recent  Visit with [name]- insulin changes.  Son reports he reviewed the changes, assists pt  With how much sliding scale Novolog to take with meals.   Pt reports she [facility]  [facility], waiting for a letter to say when she starts.  Son reports he goes to [place] it will help pt be more active.          Objective:   Vitals:   10/21/16 1123  BP: (!) 102/52  Pulse: 88  Resp: 20    ROS  Physical Exam  Constitutional: She appears well-developed and well-nourished.  Cardiovascular: Normal rate and regular rhythm.   Respiratory: Effort normal and breath sounds normal.  GI: Soft. Bowel sounds are normal.  Musculoskeletal: She exhibits no edema.  Neurological: She is alert.  Forgetful   Skin: Skin is warm and dry.  Psychiatric: She has a normal mood and affect. Her behavior is normal. Judgment and thought content normal.    Encounter Medications:   Outpatient Encounter Prescriptions as of 10/21/2016  Medication Sig Note  . aspirin EC 81 MG tablet Take 1 tablet (81 mg total) by mouth daily.   Marland Kitchen atorvastatin (LIPITOR) 80 MG tablet Take 1 tablet (80 mg total) by mouth daily.   . clopidogrel (PLAVIX) 75 MG tablet Take 1 tablet (75 mg total) by mouth daily.   . insulin aspart (NOVOLOG) 100 UNIT/ML injection Inject into the skin. Pt to take 6 units with breakfast, 9 units with lunch and supper.  If sugar is running high before a meal, take extra insulin per  sliding scale:  200- 249 one unit, 250-299 two units, 300-349 three units, 350-399 four units, >400 five units.   Marland Kitchen LANTUS SOLOSTAR 100 UNIT/ML Solostar Pen INJECT 24 UNITS SUBCUTANEOUSLY NIGHTLY. 10/21/2016: Pt taking 20 units at night.   . levothyroxine (SYNTHROID, LEVOTHROID) 100 MCG tablet TAKE 1 TABLET(100 MCG) BY MOUTH DAILY   . metFORMIN (GLUCOPHAGE) 500 MG tablet Take 1 tablet (500 mg total) by mouth 2 (two) times daily with a meal.   . pantoprazole (PROTONIX) 40 MG tablet Take 1 tablet (40 mg total) by mouth 2 (two) times daily.   . fluticasone (FLONASE) 50 MCG/ACT nasal spray Place 2 sprays into both nostrils daily.    Marland Kitchen glucagon 1 MG injection Inject 1 mg into the vein once as needed. 10/21/2016: Available if needed.   . insulin lispro (HUMALOG) 100 UNIT/ML KiwkPen Inject 0.08 mLs (8 Units total) into the skin 3 (three) times daily. (Patient taking differently: Inject 9 Units into the skin 3 (three) times daily. ) 08/15/2016: Pt to add extra if sugar over 200. Per [name] 200-249 = one unit                          250-299= 2 units  300-349= 3 units                          350-399= 4 units                        Over 401 = 5 units   . levothyroxine (SYNTHROID, LEVOTHROID) 88 MCG tablet TAKE 1 TABLET(88 MCG) BY MOUTH DAILY (Patient not taking: Reported on 10/21/2016)   . metFORMIN (GLUCOPHAGE) 500 MG tablet TAKE 1 TABLET(500 MG) BY MOUTH TWICE DAILY WITH A MEAL 10/21/2016: Repeat    No facility-administered encounter medications on file as of 10/21/2016.     Functional Status:   In your present state of health, do you have any difficulty performing the following activities: 08/15/2016 07/15/2016  Hearing? Tempie Donning  Vision? N N  Difficulty concentrating or making decisions? Y N  Walking or climbing stairs? Y Y  Dressing or bathing? N N  Doing errands, shopping? Y N  Preparing Food and eating ? Y -  Using the Toilet? N -  In the past six months, have you accidently  leaked urine? Y -  Do you have problems with loss of bowel control? N -  Managing your Medications? Y -  Managing your Finances? Y -  Housekeeping or managing your Housekeeping? Y -  Some recent data might be hidden    Fall/Depression Screening:    PHQ 2/9 Scores 08/15/2016 04/01/2016 01/04/2016 08/15/2014 07/26/2012 06/15/2012  PHQ - 2 Score 0 0 0 0 0 3    Assessment:  Pleasant 61 year old woman, son lives with her. Granddaughter close by, assists as Needed but son main caregiver.   Upon arrival, pt sitting in chair, informed got up late today- checked Sugar during visit- 505 (forgot to take Lantus insulin last night). DM:  Review of pt's glucometer readings- earlier this am 524, checked 1.5 hours later during home visit          505.  Ranges: in am 72-524, pm 94-577/several registered Highs.  Observed pt administering her           Novolog insulin this am (6 units +5 extra= 11 units), noted pt did not keep insulin pen in long enough/          Small mount of insulin leaked to which discussed with pt/voiced understanding.           Diet- reviewed with pt importance of monitoring carb intake- elevate sugars.    Plan:  Pt to follow up with [name] 4/4.             RN CM to call [name], inform of elevated sugar today/missed yesterday's nighttime insulin,                        Elevated pm sugars.             With pt being accepted into [facility], plan to call pt  within next 2 weeks to confirm              Starting date (once pt starts in program,no longer eligible for [facility] program- to discharge).               THN CM Care Plan Problem One     Most Recent Value  Care Plan Problem One  DM- uncontrolled   Role Documenting the Problem One  Care Management Coordinator  Care Plan for Problem One  Active  THN Long Term Goal (31-90 days)  Pt would see a one point drop in A1C in the next 60 days   THN Long Term Goal Start Date  09/25/16  Interventions for Problem One Long Term Goal   Reviewed with pt/son   THN CM Short Term Goal #1 (0-30 days)  Improvement seen with pt checking sugars within the next 20 days   THN CM Short Term Goal #1 Start Date  09/25/16  [name] CM Short Term Goal #1 Met Date  10/21/16  THN CM Short Term Goal #2 (0-30 days)  Pt would not have any low sugars in the next 20 days   THN CM Short Term Goal #2 Start Date  09/25/16  [facility] CM Short Term Goal #2 Met Date  10/21/16  THN CM Short Term Goal #3 (0-30 days)  Pt would have better adherence with taking Lantus insulin at night as ordered.   THN CM Short Term Goal #3 Start Date  10/21/16  Interventions for Short Tern Goal #3  Reinforced with son to continue to give pt reminders to take insulin as ordered, night time one she misses at times.      [name]   [place] [facility]  [phone]

## 2016-10-31 ENCOUNTER — Other Ambulatory Visit: Payer: Self-pay | Admitting: *Deleted

## 2016-10-31 NOTE — Patient Outreach (Signed)
Follow up phone call successful.  Spoke with pt, HIPAA/identity verified (name,date of birth) but needed assistance with address (states cannot remember).   Pt reports doing good, blood sugar this am was 208, cannot remember what yesterday's sugar was.  Pt reports son still gives reminders about taking insulin, how much sliding scale.  Pt reports son is not here now,gone to [facility], goes twice a week will be home at 3 pm. Pt reports she got up at 9 am this am, son was not present, did take her insulin/ does not know how much.  RN CM reviewed insulin orders with her (take Novolog 6 units at breakfast)  to which pt reports that is what she took.  Pt reports she does check her sugar before lunch to which RN CM reviewed with pt to  take 9 units Novolog  at lunch to which pt voiced understanding.   Pt reports she will be starting [facility] in 1-2 weeks.  Pt reports she was sick last night, vomited all over and does not know why, feels better now.   Discussed with pt upcoming appointment with [name] 4/9 to which pt reports granddaughter will be taking her.   RN CM discussed with pt to bring her glucometer to upcoming MD visit for MD to review sugars to which pt voiced understanding.    Plan: As discussed with pt, plan to follow up again telephonically  within next 2 weeks to see if started with [facility] program/follow up on visit with [name].     [name]   [place] [facility]  [phone]

## 2016-11-05 DIAGNOSIS — R404 Transient alteration of awareness: Secondary | ICD-10-CM | POA: Diagnosis not present

## 2016-11-06 ENCOUNTER — Inpatient Hospital Stay: Payer: Medicare Other

## 2016-11-06 ENCOUNTER — Inpatient Hospital Stay
Admission: EM | Admit: 2016-11-06 | Discharge: 2016-11-14 | DRG: 637 | Disposition: A | Discharge status: Skilled Nursing Facility | Payer: Medicare Other | Attending: Internal Medicine | Admitting: Internal Medicine

## 2016-11-06 ENCOUNTER — Encounter: Payer: Self-pay | Admitting: Emergency Medicine

## 2016-11-06 ENCOUNTER — Emergency Department: Payer: Medicare Other

## 2016-11-06 DIAGNOSIS — Z880 Allergy status to penicillin: Secondary | ICD-10-CM

## 2016-11-06 DIAGNOSIS — D62 Acute posthemorrhagic anemia: Secondary | ICD-10-CM | POA: Diagnosis present

## 2016-11-06 DIAGNOSIS — A499 Bacterial infection, unspecified: Secondary | ICD-10-CM | POA: Diagnosis not present

## 2016-11-06 DIAGNOSIS — Z743 Need for continuous supervision: Secondary | ICD-10-CM | POA: Diagnosis not present

## 2016-11-06 DIAGNOSIS — M810 Age-related osteoporosis without current pathological fracture: Secondary | ICD-10-CM | POA: Diagnosis present

## 2016-11-06 DIAGNOSIS — E43 Unspecified severe protein-calorie malnutrition: Secondary | ICD-10-CM | POA: Diagnosis not present

## 2016-11-06 DIAGNOSIS — R042 Hemoptysis: Secondary | ICD-10-CM | POA: Diagnosis not present

## 2016-11-06 DIAGNOSIS — Z4659 Encounter for fitting and adjustment of other gastrointestinal appliance and device: Secondary | ICD-10-CM

## 2016-11-06 DIAGNOSIS — F411 Generalized anxiety disorder: Secondary | ICD-10-CM | POA: Diagnosis present

## 2016-11-06 DIAGNOSIS — N183 Chronic kidney disease, stage 3 (moderate): Secondary | ICD-10-CM | POA: Diagnosis not present

## 2016-11-06 DIAGNOSIS — Z794 Long term (current) use of insulin: Secondary | ICD-10-CM | POA: Diagnosis not present

## 2016-11-06 DIAGNOSIS — Z66 Do not resuscitate: Secondary | ICD-10-CM | POA: Diagnosis not present

## 2016-11-06 DIAGNOSIS — E860 Dehydration: Secondary | ICD-10-CM | POA: Diagnosis not present

## 2016-11-06 DIAGNOSIS — R401 Stupor: Secondary | ICD-10-CM | POA: Diagnosis not present

## 2016-11-06 DIAGNOSIS — G9340 Encephalopathy, unspecified: Secondary | ICD-10-CM

## 2016-11-06 DIAGNOSIS — G9341 Metabolic encephalopathy: Secondary | ICD-10-CM | POA: Diagnosis not present

## 2016-11-06 DIAGNOSIS — E872 Acidosis, unspecified: Secondary | ICD-10-CM | POA: Diagnosis present

## 2016-11-06 DIAGNOSIS — E870 Hyperosmolality and hypernatremia: Secondary | ICD-10-CM | POA: Diagnosis not present

## 2016-11-06 DIAGNOSIS — E1110 Type 2 diabetes mellitus with ketoacidosis without coma: Secondary | ICD-10-CM | POA: Diagnosis not present

## 2016-11-06 DIAGNOSIS — G4733 Obstructive sleep apnea (adult) (pediatric): Secondary | ICD-10-CM | POA: Diagnosis present

## 2016-11-06 DIAGNOSIS — K589 Irritable bowel syndrome without diarrhea: Secondary | ICD-10-CM | POA: Diagnosis not present

## 2016-11-06 DIAGNOSIS — K922 Gastrointestinal hemorrhage, unspecified: Secondary | ICD-10-CM | POA: Diagnosis present

## 2016-11-06 DIAGNOSIS — R7881 Bacteremia: Secondary | ICD-10-CM | POA: Diagnosis not present

## 2016-11-06 DIAGNOSIS — J440 Chronic obstructive pulmonary disease with acute lower respiratory infection: Secondary | ICD-10-CM | POA: Diagnosis present

## 2016-11-06 DIAGNOSIS — I248 Other forms of acute ischemic heart disease: Secondary | ICD-10-CM | POA: Diagnosis present

## 2016-11-06 DIAGNOSIS — E878 Other disorders of electrolyte and fluid balance, not elsewhere classified: Secondary | ICD-10-CM | POA: Diagnosis not present

## 2016-11-06 DIAGNOSIS — E875 Hyperkalemia: Secondary | ICD-10-CM | POA: Diagnosis not present

## 2016-11-06 DIAGNOSIS — J90 Pleural effusion, not elsewhere classified: Secondary | ICD-10-CM | POA: Diagnosis not present

## 2016-11-06 DIAGNOSIS — K219 Gastro-esophageal reflux disease without esophagitis: Secondary | ICD-10-CM | POA: Diagnosis present

## 2016-11-06 DIAGNOSIS — Z8542 Personal history of malignant neoplasm of other parts of uterus: Secondary | ICD-10-CM

## 2016-11-06 DIAGNOSIS — R1312 Dysphagia, oropharyngeal phase: Secondary | ICD-10-CM | POA: Diagnosis not present

## 2016-11-06 DIAGNOSIS — D509 Iron deficiency anemia, unspecified: Secondary | ICD-10-CM | POA: Diagnosis present

## 2016-11-06 DIAGNOSIS — J309 Allergic rhinitis, unspecified: Secondary | ICD-10-CM | POA: Diagnosis not present

## 2016-11-06 DIAGNOSIS — E039 Hypothyroidism, unspecified: Secondary | ICD-10-CM | POA: Diagnosis present

## 2016-11-06 DIAGNOSIS — E1122 Type 2 diabetes mellitus with diabetic chronic kidney disease: Secondary | ICD-10-CM | POA: Diagnosis not present

## 2016-11-06 DIAGNOSIS — Y95 Nosocomial condition: Secondary | ICD-10-CM | POA: Diagnosis present

## 2016-11-06 DIAGNOSIS — E559 Vitamin D deficiency, unspecified: Secondary | ICD-10-CM | POA: Diagnosis not present

## 2016-11-06 DIAGNOSIS — I700 Atherosclerosis of aorta: Secondary | ICD-10-CM | POA: Diagnosis not present

## 2016-11-06 DIAGNOSIS — H9110 Presbycusis, unspecified ear: Secondary | ICD-10-CM | POA: Diagnosis not present

## 2016-11-06 DIAGNOSIS — E871 Hypo-osmolality and hyponatremia: Secondary | ICD-10-CM | POA: Diagnosis present

## 2016-11-06 DIAGNOSIS — B957 Other staphylococcus as the cause of diseases classified elsewhere: Secondary | ICD-10-CM | POA: Diagnosis present

## 2016-11-06 DIAGNOSIS — Z4682 Encounter for fitting and adjustment of non-vascular catheter: Secondary | ICD-10-CM | POA: Diagnosis not present

## 2016-11-06 DIAGNOSIS — J189 Pneumonia, unspecified organism: Secondary | ICD-10-CM | POA: Diagnosis not present

## 2016-11-06 DIAGNOSIS — R262 Difficulty in walking, not elsewhere classified: Secondary | ICD-10-CM | POA: Diagnosis not present

## 2016-11-06 DIAGNOSIS — E785 Hyperlipidemia, unspecified: Secondary | ICD-10-CM | POA: Diagnosis not present

## 2016-11-06 DIAGNOSIS — Z8249 Family history of ischemic heart disease and other diseases of the circulatory system: Secondary | ICD-10-CM

## 2016-11-06 DIAGNOSIS — Z7902 Long term (current) use of antithrombotics/antiplatelets: Secondary | ICD-10-CM | POA: Diagnosis not present

## 2016-11-06 DIAGNOSIS — R4182 Altered mental status, unspecified: Secondary | ICD-10-CM | POA: Diagnosis present

## 2016-11-06 DIAGNOSIS — I252 Old myocardial infarction: Secondary | ICD-10-CM

## 2016-11-06 DIAGNOSIS — Z7951 Long term (current) use of inhaled steroids: Secondary | ICD-10-CM

## 2016-11-06 DIAGNOSIS — R739 Hyperglycemia, unspecified: Secondary | ICD-10-CM

## 2016-11-06 DIAGNOSIS — I358 Other nonrheumatic aortic valve disorders: Secondary | ICD-10-CM | POA: Diagnosis not present

## 2016-11-06 DIAGNOSIS — M6281 Muscle weakness (generalized): Secondary | ICD-10-CM | POA: Diagnosis not present

## 2016-11-06 DIAGNOSIS — Z8673 Personal history of transient ischemic attack (TIA), and cerebral infarction without residual deficits: Secondary | ICD-10-CM

## 2016-11-06 DIAGNOSIS — I2510 Atherosclerotic heart disease of native coronary artery without angina pectoris: Secondary | ICD-10-CM | POA: Diagnosis present

## 2016-11-06 DIAGNOSIS — N179 Acute kidney failure, unspecified: Secondary | ICD-10-CM | POA: Diagnosis present

## 2016-11-06 DIAGNOSIS — Z90710 Acquired absence of both cervix and uterus: Secondary | ICD-10-CM

## 2016-11-06 DIAGNOSIS — R7989 Other specified abnormal findings of blood chemistry: Secondary | ICD-10-CM

## 2016-11-06 DIAGNOSIS — R740 Nonspecific elevation of levels of transaminase and lactic acid dehydrogenase [LDH]: Secondary | ICD-10-CM | POA: Diagnosis present

## 2016-11-06 DIAGNOSIS — E1111 Type 2 diabetes mellitus with ketoacidosis with coma: Principal | ICD-10-CM | POA: Diagnosis present

## 2016-11-06 DIAGNOSIS — Z7982 Long term (current) use of aspirin: Secondary | ICD-10-CM | POA: Diagnosis not present

## 2016-11-06 DIAGNOSIS — Z79899 Other long term (current) drug therapy: Secondary | ICD-10-CM

## 2016-11-06 DIAGNOSIS — E1310 Other specified diabetes mellitus with ketoacidosis without coma: Secondary | ICD-10-CM | POA: Diagnosis not present

## 2016-11-06 DIAGNOSIS — Z833 Family history of diabetes mellitus: Secondary | ICD-10-CM

## 2016-11-06 DIAGNOSIS — E1165 Type 2 diabetes mellitus with hyperglycemia: Secondary | ICD-10-CM | POA: Diagnosis not present

## 2016-11-06 DIAGNOSIS — D649 Anemia, unspecified: Secondary | ICD-10-CM | POA: Diagnosis not present

## 2016-11-06 LAB — BASIC METABOLIC PANEL
ANION GAP: 9 (ref 5–15)
ANION GAP: 8 (ref 5–15)
Anion gap: UNDETERMINED (ref 5–15)
Anion gap: 21 — ABNORMAL HIGH (ref 5–15)
Anion gap: 7 (ref 5–15)
BUN: 49 mg/dL — AB (ref 6–20)
BUN: 47 mg/dL — ABNORMAL HIGH (ref 6–20)
BUN: 32 mg/dL — AB (ref 6–20)
BUN: 41 mg/dL — AB (ref 6–20)
BUN: 44 mg/dL — AB (ref 6–20)
CHLORIDE: 102 mmol/L (ref 101–111)
CHLORIDE: 112 mmol/L — AB (ref 101–111)
CO2: <7 mmol/L — ABNORMAL LOW (ref 22–32)
CO2: 9 mmol/L — ABNORMAL LOW (ref 22–32)
CO2: 14 mmol/L — ABNORMAL LOW (ref 22–32)
CO2: 20 mmol/L — ABNORMAL LOW (ref 22–32)
CO2: 19 mmol/L — ABNORMAL LOW (ref 22–32)
CREATININE: 2.85 mg/dL — AB (ref 0.44–1.00)
Calcium: 8.4 mg/dL — ABNORMAL LOW (ref 8.9–10.3)
Calcium: 8.2 mg/dL — ABNORMAL LOW (ref 8.9–10.3)
Calcium: 7 mg/dL — ABNORMAL LOW (ref 8.9–10.3)
Calcium: 8.8 mg/dL — ABNORMAL LOW (ref 8.9–10.3)
Calcium: 8.5 mg/dL — ABNORMAL LOW (ref 8.9–10.3)
Chloride: 123 mmol/L — ABNORMAL HIGH (ref 101–111)
Chloride: 121 mmol/L — ABNORMAL HIGH (ref 101–111)
Chloride: 122 mmol/L — ABNORMAL HIGH (ref 101–111)
Creatinine, Ser: 2.6 mg/dL — ABNORMAL HIGH (ref 0.44–1.00)
Creatinine, Ser: 1.6 mg/dL — ABNORMAL HIGH (ref 0.44–1.00)
Creatinine, Ser: 1.82 mg/dL — ABNORMAL HIGH (ref 0.44–1.00)
Creatinine, Ser: 1.78 mg/dL — ABNORMAL HIGH (ref 0.44–1.00)
GFR calc Af Amer: 17 mL/min — ABNORMAL LOW (ref >60)
GFR calc Af Amer: 19 mL/min — ABNORMAL LOW (ref >60)
GFR calc Af Amer: 29 mL/min — ABNORMAL LOW (ref >60)
GFR calc Af Amer: 29 mL/min — ABNORMAL LOW (ref >60)
GFR calc non Af Amer: 16 mL/min — ABNORMAL LOW (ref >60)
GFR, EST AFRICAN AMERICAN: 33 mL/min — AB (ref >60)
GFR, EST NON AFRICAN AMERICAN: 14 mL/min — AB (ref >60)
GFR, EST NON AFRICAN AMERICAN: 29 mL/min — AB (ref >60)
GFR, EST NON AFRICAN AMERICAN: 25 mL/min — AB (ref >60)
GFR, EST NON AFRICAN AMERICAN: 25 mL/min — AB (ref >60)
GLUCOSE: 1066 mg/dL — AB (ref 65–99)
GLUCOSE: 687 mg/dL — AB (ref 65–99)
Glucose, Bld: 302 mg/dL — ABNORMAL HIGH (ref 65–99)
Glucose, Bld: 143 mg/dL — ABNORMAL HIGH (ref 65–99)
Glucose, Bld: 195 mg/dL — ABNORMAL HIGH (ref 65–99)
POTASSIUM: 2.5 mmol/L — AB (ref 3.5–5.1)
POTASSIUM: 3.6 mmol/L (ref 3.5–5.1)
POTASSIUM: 4.7 mmol/L (ref 3.5–5.1)
Potassium: 6.1 mmol/L — ABNORMAL HIGH (ref 3.5–5.1)
Potassium: 3.5 mmol/L (ref 3.5–5.1)
SODIUM: 137 mmol/L (ref 135–145)
SODIUM: 142 mmol/L (ref 135–145)
SODIUM: 146 mmol/L — AB (ref 135–145)
SODIUM: 149 mmol/L — AB (ref 135–145)
SODIUM: 148 mmol/L — AB (ref 135–145)

## 2016-11-06 LAB — GLUCOSE, CAPILLARY
GLUCOSE-CAPILLARY: 390 mg/dL — AB (ref 65–99)
GLUCOSE-CAPILLARY: 138 mg/dL — AB (ref 65–99)
GLUCOSE-CAPILLARY: 128 mg/dL — AB (ref 65–99)
GLUCOSE-CAPILLARY: 134 mg/dL — AB (ref 65–99)
GLUCOSE-CAPILLARY: 156 mg/dL — AB (ref 65–99)
Glucose-Capillary: >600 mg/dL (ref 65–99)
Glucose-Capillary: >600 mg/dL (ref 65–99)
Glucose-Capillary: >600 mg/dL (ref 65–99)
Glucose-Capillary: >600 mg/dL (ref 65–99)
Glucose-Capillary: >600 mg/dL (ref 65–99)
Glucose-Capillary: 545 mg/dL (ref 65–99)
Glucose-Capillary: 460 mg/dL — ABNORMAL HIGH (ref 65–99)
Glucose-Capillary: 376 mg/dL — ABNORMAL HIGH (ref 65–99)
Glucose-Capillary: 310 mg/dL — ABNORMAL HIGH (ref 65–99)
Glucose-Capillary: 292 mg/dL — ABNORMAL HIGH (ref 65–99)
Glucose-Capillary: 163 mg/dL — ABNORMAL HIGH (ref 65–99)
Glucose-Capillary: 149 mg/dL — ABNORMAL HIGH (ref 65–99)
Glucose-Capillary: 145 mg/dL — ABNORMAL HIGH (ref 65–99)
Glucose-Capillary: 192 mg/dL — ABNORMAL HIGH (ref 65–99)

## 2016-11-06 LAB — CBC
HCT: 36.5 % (ref 35.0–47.0)
HEMATOCRIT: 39.2 % (ref 35.0–47.0)
HEMOGLOBIN: 10.5 g/dL — AB (ref 12.0–16.0)
HEMOGLOBIN: 11.5 g/dL — AB (ref 12.0–16.0)
MCH: 32.1 pg (ref 26.0–34.0)
MCH: 32.5 pg (ref 26.0–34.0)
MCHC: 28.9 g/dL — AB (ref 32.0–36.0)
MCHC: 29.2 g/dL — ABNORMAL LOW (ref 32.0–36.0)
MCV: 111.1 fL — AB (ref 80.0–100.0)
MCV: 111.1 fL — ABNORMAL HIGH (ref 80.0–100.0)
Platelets: 289 10*3/uL (ref 150–440)
Platelets: 290 10*3/uL (ref 150–440)
RBC: 3.28 MIL/uL — AB (ref 3.80–5.20)
RBC: 3.53 MIL/uL — ABNORMAL LOW (ref 3.80–5.20)
RDW: 16.1 % — ABNORMAL HIGH (ref 11.5–14.5)
RDW: 16.1 % — ABNORMAL HIGH (ref 11.5–14.5)
WBC: 15.9 10*3/uL — ABNORMAL HIGH (ref 3.6–11.0)
WBC: 18.3 10*3/uL — ABNORMAL HIGH (ref 3.6–11.0)

## 2016-11-06 LAB — COMPREHENSIVE METABOLIC PANEL
ALK PHOS: 112 U/L (ref 38–126)
ALT: 25 U/L (ref 14–54)
AST: 32 U/L (ref 15–41)
Albumin: 3.8 g/dL (ref 3.5–5.0)
Anion gap: UNDETERMINED (ref 5–15)
BUN: 49 mg/dL — ABNORMAL HIGH (ref 6–20)
CALCIUM: 9.2 mg/dL (ref 8.9–10.3)
CHLORIDE: 93 mmol/L — AB (ref 101–111)
CO2: <7 mmol/L — ABNORMAL LOW (ref 22–32)
CREATININE: 2.86 mg/dL — AB (ref 0.44–1.00)
GFR, EST AFRICAN AMERICAN: 17 mL/min — AB (ref >60)
GFR, EST NON AFRICAN AMERICAN: 14 mL/min — AB (ref >60)
Glucose, Bld: 1148 mg/dL (ref 65–99)
Potassium: 6.4 mmol/L (ref 3.5–5.1)
Sodium: 132 mmol/L — ABNORMAL LOW (ref 135–145)
TOTAL PROTEIN: 7.5 g/dL (ref 6.5–8.1)
Total Bilirubin: 1.9 mg/dL — ABNORMAL HIGH (ref 0.3–1.2)

## 2016-11-06 LAB — BLOOD CULTURE ID PANEL (REFLEXED)
ACINETOBACTER BAUMANNII: NOT DETECTED
CANDIDA KRUSEI: NOT DETECTED
CANDIDA PARAPSILOSIS: NOT DETECTED
CANDIDA TROPICALIS: NOT DETECTED
Candida albicans: NOT DETECTED
Candida glabrata: NOT DETECTED
ENTEROCOCCUS SPECIES: NOT DETECTED
ESCHERICHIA COLI: NOT DETECTED
Enterobacter cloacae complex: NOT DETECTED
Enterobacteriaceae species: NOT DETECTED
Haemophilus influenzae: NOT DETECTED
Klebsiella oxytoca: NOT DETECTED
Klebsiella pneumoniae: NOT DETECTED
LISTERIA MONOCYTOGENES: NOT DETECTED
METHICILLIN RESISTANCE: DETECTED — AB
Neisseria meningitidis: NOT DETECTED
PROTEUS SPECIES: NOT DETECTED
Pseudomonas aeruginosa: NOT DETECTED
SERRATIA MARCESCENS: NOT DETECTED
STREPTOCOCCUS PYOGENES: NOT DETECTED
Staphylococcus aureus (BCID): NOT DETECTED
Staphylococcus species: DETECTED — AB
Streptococcus agalactiae: NOT DETECTED
Streptococcus pneumoniae: NOT DETECTED
Streptococcus species: NOT DETECTED

## 2016-11-06 LAB — URINALYSIS, COMPLETE (UACMP) WITH MICROSCOPIC
Bilirubin Urine: NEGATIVE
Glucose, UA: >=500 mg/dL — AB
Hgb urine dipstick: NEGATIVE
Ketones, ur: 20 mg/dL — AB
Leukocytes, UA: NEGATIVE
Nitrite: NEGATIVE
PROTEIN: NEGATIVE mg/dL
RBC / HPF: NONE SEEN RBC/hpf (ref 0–5)
SPECIFIC GRAVITY, URINE: 1.02 (ref 1.005–1.030)
pH: 5 (ref 5.0–8.0)

## 2016-11-06 LAB — BLOOD GAS, ARTERIAL
FIO2: 0.21
PH ART: 7.04 — AB (ref 7.350–7.450)
Patient temperature: 37
pCO2 arterial: 19 mmHg — CL (ref 32.0–48.0)
pO2, Arterial: 119 mmHg — ABNORMAL HIGH (ref 83.0–108.0)

## 2016-11-06 LAB — PHOSPHORUS: PHOSPHORUS: 1.6 mg/dL — AB (ref 2.5–4.6)

## 2016-11-06 LAB — LACTIC ACID, PLASMA
LACTIC ACID, VENOUS: 5.8 mmol/L — AB (ref 0.5–1.9)
LACTIC ACID, VENOUS: 2.9 mmol/L — AB (ref 0.5–1.9)
Lactic Acid, Venous: 7.2 mmol/L (ref 0.5–1.9)
Lactic Acid, Venous: 4.8 mmol/L (ref 0.5–1.9)

## 2016-11-06 LAB — BETA-HYDROXYBUTYRIC ACID

## 2016-11-06 LAB — TROPONIN I
TROPONIN I: 0.05 ng/mL — AB (ref <0.03)
TROPONIN I: 0.08 ng/mL — AB (ref <0.03)
Troponin I: 0.21 ng/mL (ref <0.03)

## 2016-11-06 LAB — BRAIN NATRIURETIC PEPTIDE: B NATRIURETIC PEPTIDE 5: 313 pg/mL — AB (ref 0.0–100.0)

## 2016-11-06 LAB — POCT GASTRIC OCCULT BLOOD (1-CARD TO LAB): OCCULT BLOOD, GASTRIC: POSITIVE — AB

## 2016-11-06 LAB — MAGNESIUM
Magnesium: 2.2 mg/dL (ref 1.7–2.4)
Magnesium: 1.6 mg/dL — ABNORMAL LOW (ref 1.7–2.4)

## 2016-11-06 LAB — LIPASE, BLOOD: LIPASE: 25 U/L (ref 11–51)

## 2016-11-06 LAB — PROTIME-INR
INR: 1.15
Prothrombin Time: 14.8 seconds (ref 11.4–15.2)

## 2016-11-06 LAB — MRSA PCR SCREENING: MRSA BY PCR: NEGATIVE

## 2016-11-06 LAB — PROCALCITONIN: Procalcitonin: 1.37 ng/mL

## 2016-11-06 LAB — ALBUMIN: ALBUMIN: 3.3 g/dL — AB (ref 3.5–5.0)

## 2016-11-06 MED ORDER — SODIUM CHLORIDE 0.9 % IV BOLUS (SEPSIS)
1000.0000 mL | INTRAVENOUS | Status: AC
  Administered 2016-11-06: 1000 mL via INTRAVENOUS

## 2016-11-06 MED ORDER — MORPHINE SULFATE (PF) 4 MG/ML IV SOLN
2.0000 mg | INTRAVENOUS | Status: DC | PRN
  Administered 2016-11-06: 2 mg via INTRAVENOUS

## 2016-11-06 MED ORDER — DEXTROSE 50 % IV SOLN: 25.0000 mL | Freq: Once | INTRAVENOUS | Status: DC

## 2016-11-06 MED ORDER — DEXTROSE-NACL 5-0.45 % IV SOLN
INTRAVENOUS | Status: DC
  Administered 2016-11-06: 22:00:00 via INTRAVENOUS

## 2016-11-06 MED ORDER — VANCOMYCIN HCL IN DEXTROSE 750-5 MG/150ML-% IV SOLN
750.0000 mg | INTRAVENOUS | Status: DC
  Administered 2016-11-07: 750 mg via INTRAVENOUS
  Filled 2016-11-06: qty 150

## 2016-11-06 MED ORDER — SODIUM CHLORIDE 0.9 % IV SOLN
INTRAVENOUS | Status: DC
  Administered 2016-11-06: 23:00:00 via INTRAVENOUS

## 2016-11-06 MED ORDER — SODIUM CHLORIDE 0.9 % IV BOLUS (SEPSIS)
1000.0000 mL | Freq: Once | INTRAVENOUS | Status: AC
  Administered 2016-11-06: 1000 mL via INTRAVENOUS

## 2016-11-06 MED ORDER — HEPARIN SODIUM (PORCINE) 5000 UNIT/ML IJ SOLN
5000.0000 [IU] | Freq: Three times a day (TID) | INTRAMUSCULAR | Status: DC
  Administered 2016-11-06 – 2016-11-10 (×13): 5000 [IU] via SUBCUTANEOUS
  Filled 2016-11-06 (×13): qty 1

## 2016-11-06 MED ORDER — ONDANSETRON HCL 4 MG/2ML IJ SOLN
4.0000 mg | Freq: Four times a day (QID) | INTRAMUSCULAR | Status: DC | PRN
  Administered 2016-11-06 – 2016-11-08 (×2): 4 mg via INTRAVENOUS
  Filled 2016-11-06 (×2): qty 2

## 2016-11-06 MED ORDER — SODIUM CHLORIDE 0.9 % IV SOLN: INTRAVENOUS | Status: DC

## 2016-11-06 MED ORDER — POTASSIUM CHLORIDE 20 MEQ PO PACK
40.0000 meq | PACK | Freq: Once | ORAL | Status: AC
Start: 2016-11-06 — End: 2016-11-06
  Administered 2016-11-06: 40 meq via ORAL
  Filled 2016-11-06: qty 2

## 2016-11-06 MED ORDER — SODIUM CHLORIDE 0.9 % IV SOLN
INTRAVENOUS | Status: DC
  Administered 2016-11-06: 03:00:00 via INTRAVENOUS

## 2016-11-06 MED ORDER — INSULIN GLARGINE 100 UNIT/ML ~~LOC~~ SOLN
22.0000 [IU] | Freq: Every day | SUBCUTANEOUS | Status: DC
  Administered 2016-11-06 – 2016-11-09 (×4): 22 [IU] via SUBCUTANEOUS
  Filled 2016-11-06 (×5): qty 0.22

## 2016-11-06 MED ORDER — MORPHINE SULFATE (PF) 4 MG/ML IV SOLN
2.0000 mg | INTRAVENOUS | Status: DC | PRN
  Administered 2016-11-06: 2 mg via INTRAVENOUS
  Filled 2016-11-06: qty 1

## 2016-11-06 MED ORDER — SODIUM CHLORIDE 0.9 % IV SOLN
30.0000 meq | Freq: Once | INTRAVENOUS | Status: AC
  Administered 2016-11-06: 30 meq via INTRAVENOUS
  Filled 2016-11-06: qty 15

## 2016-11-06 MED ORDER — MORPHINE SULFATE (PF) 4 MG/ML IV SOLN
1.0000 mg | INTRAVENOUS | Status: DC | PRN
  Administered 2016-11-06 – 2016-11-08 (×4): 1 mg via INTRAVENOUS
  Filled 2016-11-06 (×5): qty 1

## 2016-11-06 MED ORDER — PANTOPRAZOLE SODIUM 40 MG IV SOLR
40.0000 mg | Freq: Two times a day (BID) | INTRAVENOUS | Status: DC
  Administered 2016-11-06 – 2016-11-10 (×10): 40 mg via INTRAVENOUS
  Filled 2016-11-06 (×9): qty 40

## 2016-11-06 MED ORDER — KCL IN DEXTROSE-NACL 20-5-0.45 MEQ/L-%-% IV SOLN
INTRAVENOUS | Status: DC
  Administered 2016-11-06: 18:00:00 via INTRAVENOUS
  Filled 2016-11-06 (×3): qty 1000

## 2016-11-06 MED ORDER — CLINDAMYCIN PHOSPHATE 600 MG/50ML IV SOLN
600.0000 mg | Freq: Three times a day (TID) | INTRAVENOUS | Status: DC
  Administered 2016-11-06 – 2016-11-07 (×4): 600 mg via INTRAVENOUS
  Filled 2016-11-06 (×6): qty 50

## 2016-11-06 MED ORDER — SODIUM CHLORIDE 0.9 % IV SOLN
INTRAVENOUS | Status: DC
  Administered 2016-11-06 (×2): via INTRAVENOUS

## 2016-11-06 MED ORDER — SODIUM CHLORIDE 0.9 % IV SOLN: 30.0000 meq | Freq: Once | INTRAVENOUS | Status: DC

## 2016-11-06 MED ORDER — INSULIN REGULAR HUMAN 100 UNIT/ML IJ SOLN: 10.0000 [IU] | Freq: Once | INTRAMUSCULAR | Status: DC

## 2016-11-06 MED ORDER — INSULIN ASPART 100 UNIT/ML ~~LOC~~ SOLN
0.0000 [IU] | SUBCUTANEOUS | Status: DC
  Administered 2016-11-07: 5 [IU] via SUBCUTANEOUS
  Administered 2016-11-07: 8 [IU] via SUBCUTANEOUS
  Administered 2016-11-07: 2 [IU] via SUBCUTANEOUS
  Administered 2016-11-08 (×5): 3 [IU] via SUBCUTANEOUS
  Administered 2016-11-09: 17:00:00 2 [IU] via SUBCUTANEOUS
  Administered 2016-11-09: 8 [IU] via SUBCUTANEOUS
  Administered 2016-11-09 – 2016-11-10 (×3): 2 [IU] via SUBCUTANEOUS
  Filled 2016-11-06: qty 3
  Filled 2016-11-06 (×2): qty 8
  Filled 2016-11-06 (×2): qty 2
  Filled 2016-11-06 (×3): qty 3
  Filled 2016-11-06: qty 5
  Filled 2016-11-06: qty 2
  Filled 2016-11-06 (×2): qty 3
  Filled 2016-11-06: qty 2

## 2016-11-06 MED ORDER — SODIUM CHLORIDE 0.9 % IV BOLUS (SEPSIS)
250.0000 mL | Freq: Once | INTRAVENOUS | Status: AC
  Administered 2016-11-06: 250 mL via INTRAVENOUS

## 2016-11-06 MED ORDER — PANTOPRAZOLE SODIUM 40 MG PO PACK
40.0000 mg | PACK | Freq: Two times a day (BID) | ORAL | Status: DC
  Administered 2016-11-06: 40 mg
  Filled 2016-11-06 (×2): qty 20

## 2016-11-06 MED ORDER — DEXTROSE-NACL 5-0.45 % IV SOLN
INTRAVENOUS | Status: DC
  Administered 2016-11-06: 15:00:00 via INTRAVENOUS

## 2016-11-06 MED ORDER — MORPHINE SULFATE (PF) 4 MG/ML IV SOLN
INTRAVENOUS | Status: AC
  Filled 2016-11-06: qty 1

## 2016-11-06 MED ORDER — ORAL CARE MOUTH RINSE
15.0000 mL | Freq: Two times a day (BID) | OROMUCOSAL | Status: DC
  Administered 2016-11-06 – 2016-11-14 (×10): 15 mL via OROMUCOSAL

## 2016-11-06 MED ORDER — VANCOMYCIN HCL IN DEXTROSE 750-5 MG/150ML-% IV SOLN
750.0000 mg | Freq: Once | INTRAVENOUS | Status: AC
  Administered 2016-11-06: 750 mg via INTRAVENOUS
  Filled 2016-11-06: qty 150

## 2016-11-06 MED ORDER — SODIUM CHLORIDE 0.9 % IV SOLN
0.0000 ug/min | INTRAVENOUS | Status: DC
  Filled 2016-11-06: qty 1

## 2016-11-06 MED ORDER — SODIUM CHLORIDE 0.9 % IV SOLN
INTRAVENOUS | Status: DC
  Administered 2016-11-06: 5.4 [IU]/h via INTRAVENOUS
  Filled 2016-11-06: qty 2.5

## 2016-11-06 NOTE — H&P (Signed)
[facility] [place] NAME: [name] [name]    MR#:  [id number]  DATE OF BIRTH:  [birth year]  DATE OF ADMISSION:  11/06/2016  PRIMARY CARE PHYSICIAN: [name], DO   REQUESTING/REFERRING PHYSICIAN:   CHIEF COMPLAINT:   Chief Complaint  Patient presents with  . Hyperglycemia  . Altered Mental Status    HISTORY OF PRESENT ILLNESS: [name] [name]  is a 83 y.o. female with a known history of Coronary artery disease, COPD, diabetes mellitus, uterine cancer, hyperlipidemia, irritable bowel syndrome, sleep apnea, osteoporosis, hypothyroidism, CVA presented to the emergency room with confusion and change in mental status. Patient was evaluated in the emergency room her blood sugar was very elevated. Patient lives with her daughter at home. Patient is not oriented to time place and person unable to give any history. She is dry and dehydrated. Her blood sugar in the emergency room was more than thousand milligrams per deciliter. Beta hydroxybutyrate test was pending. Patient was severely acidotic on ABG. Her CO2 was less than 7 and creatinine was 2.86. Bedside lactate level was also elevated. Patient was given 2 L of IV normal saline bolus in the emergency room and started on IV insulin drip protocol. Case was discussed with intensivist attending on call who recommended IV insulin drip and IV fluid hydration and admission to the ICU. Not much history could be obtained from the patient.  PAST MEDICAL HISTORY:   Past Medical History:  Diagnosis Date  . Anxiety state, unspecified   . CAD (coronary artery disease)   . Cancer [facility])    2004 Right breast, found on mammogram, radiation therapy, Dr. Bryson Ha, uterine cancer,   . COPD (chronic obstructive pulmonary disease) ([place])   . Diabetes mellitus   . Esophageal reflux   . Heart burn   . Hyperlipidemia   . IBS (irritable bowel syndrome)   . Myocardial infarction    Cath negative except for 40%  occlusion LAD.  Pt not candidate for betablocker or ACEI because of hypotension  . OSA (obstructive sleep apnea)   . Osteoporosis 02/21/09   DEXA scan showed osteoporosis with left femur T-score -2.8.  Marland Kitchen Presbyacusis   . Stroke ([place])   . Thyroid disease    Hypothyroid  . Vitamin D deficiency     PAST SURGICAL HISTORY: Past Surgical History:  Procedure Laterality Date  . ABDOMINAL HYSTERECTOMY     uterine cancer  . BREAST SURGERY    . COLON SURGERY  2013   done at [place]:  Social History  Substance Use Topics  . Smoking status: Never Smoker  . Smokeless tobacco: Never Used  . Alcohol use No    FAMILY HISTORY:  Family History  Problem Relation Age of Onset  . Diabetes Mother   . Heart attack Father     DRUG ALLERGIES:  Allergies  Allergen Reactions  . Penicillins Rash    Has patient had a PCN reaction causing immediate rash, facial/tongue/throat swelling, SOB or lightheadedness with hypotension: No Has patient had a PCN reaction causing severe rash involving mucus membranes or skin necrosis: No Has patient had a PCN reaction that required hospitalization No Has patient had a PCN reaction occurring within the last 10 years: No If all of the above answers are "NO", then may proceed with Cephalosporin use.     REVIEW OF SYSTEMS:  Could not be obtained secondary to altered mental status and confusion.  MEDICATIONS AT HOME:  Prior to Admission medications   Medication Sig Start Date End Date Taking? Authorizing Provider  aspirin EC 81 MG tablet Take 1 tablet (81 mg total) by mouth daily. 04/09/16  Yes Jayce G Cook, DO  atorvastatin (LIPITOR) 80 MG tablet Take 1 tablet (80 mg total) by mouth daily. 08/21/16  Yes [name], DO  clopidogrel (PLAVIX) 75 MG tablet Take 1 tablet (75 mg total) by mouth daily. 08/15/16  Yes Jayce G Cook, DO  fluticasone (FLONASE) 50 MCG/ACT nasal spray Place 2 sprays into both nostrils daily.    Yes Historical Provider, MD   insulin aspart (NOVOLOG) 100 UNIT/ML injection Inject into the skin. Pt to take 8 units with breakfast, 10 units with lunch and  12 units supper.  If sugar is running high before a meal, take extra insulin per sliding scale:  200- 249 one unit, 250-299 two units, 300-349 three units, 350-399 four units, >400 five units.   Yes Historical Provider, MD  insulin glargine (LANTUS) 100 UNIT/ML injection Inject 22 Units into the skin at bedtime.   Yes Historical Provider, MD  levothyroxine (SYNTHROID, LEVOTHROID) 88 MCG tablet Take 88 mcg by mouth daily before breakfast.   Yes Historical Provider, MD  metFORMIN (GLUCOPHAGE) 500 MG tablet TAKE 1 TABLET(500 MG) BY MOUTH TWICE DAILY WITH A MEAL 10/20/16  Yes Jayce G Cook, DO  pantoprazole (PROTONIX) 40 MG tablet Take 1 tablet (40 mg total) by mouth 2 (two) times daily. 01/04/16  Yes Jackolyn Confer, MD  glucagon 1 MG injection Inject 1 mg into the vein once as needed.    Historical Provider, MD  levothyroxine (SYNTHROID, LEVOTHROID) 100 MCG tablet TAKE 1 TABLET(100 MCG) BY MOUTH DAILY 08/21/16   [name], DO  levothyroxine (SYNTHROID, LEVOTHROID) 88 MCG tablet TAKE 1 TABLET(88 MCG) BY MOUTH DAILY Patient not taking: Reported on 10/21/2016 10/20/16   [name], DO  metFORMIN (GLUCOPHAGE) 500 MG tablet Take 1 tablet (500 mg total) by mouth 2 (two) times daily with a meal. 08/03/15   Jackolyn Confer, MD      PHYSICAL EXAMINATION:   VITAL SIGNS: Blood pressure (!) 93/50, pulse (!) 123, temperature 98 F (36.7 C), temperature source Oral, resp. rate (!) 26, height 5\' 5"  (1.651 m), weight 63.2 kg (139 lb 4.8 oz), SpO2 100 %.  GENERAL:  83 y.o.-year-old patient lying in the bed disoriented  EYES: Pupils equal, round, reactive to light and accommodation. No scleral icterus. Extraocular muscles intact.  HEENT: Head atraumatic, normocephalic. Oropharynx dry and nasopharynx clear.  NECK:  Supple, no jugular venous distention. No thyroid enlargement, no  tenderness.  LUNGS: Normal breath sounds bilaterally, no wheezing, rales,rhonchi or crepitation. No use of accessory muscles of respiration.  CARDIOVASCULAR: S1, S2 tachycardia noted. No murmurs, rubs, or gallops.  ABDOMEN: Soft, nontender, nondistended. Bowel sounds present. No organomegaly or mass.  EXTREMITIES: No pedal edema, cyanosis, or clubbing.  NEUROLOGIC: Not oriented to time, place and person. Moves all extremities. Complete nervous system exam not possible.  Gait not checked.  PSYCHIATRIC: could not be assessed SKIN: No obvious rash, lesion, or ulcer.   LABORATORY PANEL:   CBC  Recent Labs Lab 11/06/16 0008  WBC 15.9*  HGB 11.5*  HCT 39.2  PLT 290  MCV 111.1*  MCH 32.5  MCHC 29.2*  RDW 16.1*   ------------------------------------------------------------------------------------------------------------------  Chemistries   Recent Labs Lab 11/06/16 0008  NA 132*  K 6.4*  CL 93*  CO2 <7*  GLUCOSE 1,148*  BUN 49*  CREATININE 2.86*  CALCIUM 9.2  MG 2.2  AST 32  ALT 25  ALKPHOS 112  BILITOT 1.9*   ------------------------------------------------------------------------------------------------------------------ estimated creatinine clearance is 13.6 mL/min (A) (by C-G formula based on SCr of 2.86 mg/dL (H)). ------------------------------------------------------------------------------------------------------------------ No results for input(s): TSH, T4TOTAL, T3FREE, THYROIDAB in the last 72 hours.  Invalid input(s): FREET3   Coagulation profile No results for input(s): INR, PROTIME in the last 168 hours. ------------------------------------------------------------------------------------------------------------------- No results for input(s): DDIMER in the last 72 hours. -------------------------------------------------------------------------------------------------------------------  Cardiac Enzymes  Recent Labs Lab 11/06/16 0008  TROPONINI  0.05*   ------------------------------------------------------------------------------------------------------------------ Invalid input(s): POCBNP  ---------------------------------------------------------------------------------------------------------------  Urinalysis    Component Value Date/Time   COLORURINE YELLOW (A) 11/06/2016 0008   APPEARANCEUR HAZY (A) 11/06/2016 0008   APPEARANCEUR Clear 10/15/2012 1929   LABSPEC 1.020 11/06/2016 0008   LABSPEC 1.029 10/15/2012 1929   PHURINE 5.0 11/06/2016 0008   GLUCOSEU >=500 (A) 11/06/2016 0008   GLUCOSEU >=1000 (A) 07/20/2015 1418   HGBUR NEGATIVE 11/06/2016 0008   BILIRUBINUR NEGATIVE 11/06/2016 0008   BILIRUBINUR negative 08/28/2016 1404   BILIRUBINUR Negative 10/15/2012 1929   KETONESUR 20 (A) 11/06/2016 0008   PROTEINUR NEGATIVE 11/06/2016 0008   UROBILINOGEN 0.2 08/28/2016 1404   UROBILINOGEN 0.2 07/20/2015 1418   NITRITE NEGATIVE 11/06/2016 0008   LEUKOCYTESUR NEGATIVE 11/06/2016 0008   LEUKOCYTESUR Negative 10/15/2012 1929     RADIOLOGY: Dg Chest Port 1 View  Result Date: 11/06/2016 CLINICAL DATA:  Sepsis and hyperglycemia EXAM: PORTABLE CHEST 1 VIEW COMPARISON:  None. FINDINGS: The heart size and mediastinal contours are within normal limits. There is calcific aortic atherosclerosis. Both lungs are clear. The visualized skeletal structures are unremarkable. IMPRESSION: No active disease.  Aortic atherosclerosis. Electronically Signed   By: Ulyses Jarred M.D.   On: 11/06/2016 01:01    EKG: Orders placed or performed during the hospital encounter of 11/06/16  . EKG 12-Lead  . EKG 12-Lead  . EKG 12-Lead  . EKG 12-Lead    IMPRESSION AND PLAN: 83 year old elderly female patient with history of coronary artery disease, diabetes mellitus, hypothyroidism, osteoporosis, uterine cancer, CVA presented to the emergency room with confusion and disorientation. Her blood sugar level was very elevated. Admitting diagnosis 1.   Acute encephalopathy secondary to metabolic cause 2. Hyperosmolar hyperglycemia 3. hyperkalemia 4. Diabetic ketoacidosis 5. Hyponatremia 6. Uncontrolled diabetes mellitus 7. Acute renal failure 8. Elevated lactate level secondary to dehydration Treatment plan Admit patient to ICU Start patient on IV insulin drip based on protocol Aggressive IV fluid hydration Keep patient nothing by mouth DVT prophylaxis subcutaneous heparin Monitor electrolytes and renal function Follow up lactate level Intensivist consultation CODE STATUS full code Condition of the patient guarded Total critical care time spent in seeing the patient reviewing chart coordination of care 58 minutes  All the records are reviewed and case discussed with ED provider. Management plans discussed with the patient, family and they are in agreement.  CODE STATUS:FULL CODE Code Status History    Date Active Date Inactive Code Status Order ID Comments User Context   07/15/2016  6:36 PM 07/17/2016 10:11 PM Full Code [id number]  Fritzi Mandes, MD Inpatient   06/08/2015  9:01 PM 06/11/2015  9:20 PM Full Code [id number]  Henreitta Leber, MD Inpatient   06/08/2015  5:16 PM 06/08/2015  9:01 PM Full Code [id number]  Henreitta Leber, MD ED       TOTAL TIME TAKING CARE OF THIS PATIENT: 58 minutes.  Saundra Shelling M.D on 11/06/2016 at 2:38 AM  Between 7am to 6pm - Pager - [phone]  After 6pm go to [email or web address] - password [email or web address] [place] [facility]  Office  [phone]  CC: Primary care physician; [name], DO

## 2016-11-06 NOTE — Progress Notes (Signed)
ANTIBIOTIC CONSULT NOTE - INITIAL  Pharmacy Consult for Vancomycin Indication: bacteremia  Allergies  Allergen Reactions  . Penicillins Rash    Has patient had a PCN reaction causing immediate rash, facial/tongue/throat swelling, SOB or lightheadedness with hypotension: No Has patient had a PCN reaction causing severe rash involving mucus membranes or skin necrosis: No Has patient had a PCN reaction that required hospitalization No Has patient had a PCN reaction occurring within the last 10 years: No If all of the above answers are "NO", then may proceed with Cephalosporin use.     Patient Measurements: Height: 5\' 5"  (165.1 cm) Weight: 139 lb 1.8 oz (63.1 kg) IBW/kg (Calculated) : 57 Adjusted Body Weight: 59.4 kg   Vital Signs: Temp: 98.5 F (36.9 C) (04/12 1533) Temp Source: Axillary (04/12 1533) BP: 91/57 (04/12 1730) Pulse Rate: 98 (04/12 1730) Intake/Output from previous day: 04/11 0701 - 04/12 0700 In: 150 [P.O.:150] Out: 1 [Urine:1] Intake/Output from this shift: No intake/output data recorded.  Labs:  Recent Labs  11/06/16 0008 11/06/16 0402 11/06/16 0817 11/06/16 1226 11/06/16 1619  WBC 15.9* 18.3*  --   --   --   HGB 11.5* 10.5*  --   --   --   PLT 290 289  --   --   --   CREATININE 2.86* 2.85* 2.60* 1.60* 1.82*   Estimated Creatinine Clearance: 21.4 mL/min (A) (by C-G formula based on SCr of 1.82 mg/dL (H)). No results for input(s): VANCOTROUGH, VANCOPEAK, VANCORANDOM, GENTTROUGH, GENTPEAK, GENTRANDOM, TOBRATROUGH, TOBRAPEAK, TOBRARND, AMIKACINPEAK, AMIKACINTROU, AMIKACIN in the last 72 hours.   Microbiology: Recent Results (from the past 720 hour(s))  Blood Culture (routine x 2)     Status: None (Preliminary result)   Collection Time: 11/06/16  1:06 AM  Result Value Ref Range Status   Specimen Description BLOOD L AC  Final   Special Requests Blood Culture adequate volume  Final   Culture NO GROWTH < 12 HOURS  Final   Report Status PENDING   Incomplete  Blood Culture (routine x 2)     Status: None (Preliminary result)   Collection Time: 11/06/16  1:06 AM  Result Value Ref Range Status   Specimen Description BLOOD R AC  Final   Special Requests Blood Culture adequate volume  Final   Culture  Setup Time   Final    Organism ID to follow GRAM POSITIVE COCCI ANAEROBIC BOTTLE ONLY CRITICAL RESULT CALLED TO, READ BACK BY AND VERIFIED WITH: Jullia Mulligan AT [id number] ON 11/06/16 BY SNJ    Culture GRAM POSITIVE COCCI  Final   Report Status PENDING  Incomplete  Blood Culture ID Panel (Reflexed)     Status: Abnormal   Collection Time: 11/06/16  1:06 AM  Result Value Ref Range Status   Enterococcus species NOT DETECTED NOT DETECTED Final   Listeria monocytogenes NOT DETECTED NOT DETECTED Final   Staphylococcus species DETECTED (A) NOT DETECTED Final    Comment: Methicillin (oxacillin) resistant coagulase negative staphylococcus. Possible blood culture contaminant (unless isolated from more than one blood culture draw or clinical case suggests pathogenicity). No antibiotic treatment is indicated for blood  culture contaminants. CRITICAL RESULT CALLED TO, READ BACK BY AND VERIFIED WITH: Alijah Akram AT [id number] ON 11/06/16 BY SNJ    Staphylococcus aureus NOT DETECTED NOT DETECTED Final   Methicillin resistance DETECTED (A) NOT DETECTED Final    Comment: CRITICAL RESULT CALLED TO, READ BACK BY AND VERIFIED WITH: Laela Deviney AT [phone] ON 11/06/16 BY SNJ  Streptococcus species NOT DETECTED NOT DETECTED Final   Streptococcus agalactiae NOT DETECTED NOT DETECTED Final   Streptococcus pneumoniae NOT DETECTED NOT DETECTED Final   Streptococcus pyogenes NOT DETECTED NOT DETECTED Final   Acinetobacter baumannii NOT DETECTED NOT DETECTED Final   Enterobacteriaceae species NOT DETECTED NOT DETECTED Final   Enterobacter cloacae complex NOT DETECTED NOT DETECTED Final   Escherichia coli NOT DETECTED NOT DETECTED Final   Klebsiella oxytoca NOT DETECTED  NOT DETECTED Final   Klebsiella pneumoniae NOT DETECTED NOT DETECTED Final   Proteus species NOT DETECTED NOT DETECTED Final   Serratia marcescens NOT DETECTED NOT DETECTED Final   Haemophilus influenzae NOT DETECTED NOT DETECTED Final   Neisseria meningitidis NOT DETECTED NOT DETECTED Final   Pseudomonas aeruginosa NOT DETECTED NOT DETECTED Final   Candida albicans NOT DETECTED NOT DETECTED Final   Candida glabrata NOT DETECTED NOT DETECTED Final   Candida krusei NOT DETECTED NOT DETECTED Final   Candida parapsilosis NOT DETECTED NOT DETECTED Final   Candida tropicalis NOT DETECTED NOT DETECTED Final  MRSA PCR Screening     Status: None   Collection Time: 11/06/16  3:28 AM  Result Value Ref Range Status   MRSA by PCR NEGATIVE NEGATIVE Final    Comment:        The GeneXpert MRSA Assay (FDA approved for NASAL specimens only), is one component of a comprehensive MRSA colonization surveillance program. It is not intended to diagnose MRSA infection nor to guide or monitor treatment for MRSA infections.     Medical History: Past Medical History:  Diagnosis Date  . Anxiety state, unspecified   . CAD (coronary artery disease)   . Cancer [facility])    2004 Right breast, found on mammogram, radiation therapy, Dr. Bryson Ha, uterine cancer,   . COPD (chronic obstructive pulmonary disease) ([place])   . Diabetes mellitus   . Esophageal reflux   . Heart burn   . Hyperlipidemia   . IBS (irritable bowel syndrome)   . Myocardial infarction    Cath negative except for 40% occlusion LAD.  Pt not candidate for betablocker or ACEI because of hypotension  . OSA (obstructive sleep apnea)   . Osteoporosis 02/21/09   DEXA scan showed osteoporosis with left femur T-score -2.8.  Marland Kitchen Presbyacusis   . Stroke ([place])   . Thyroid disease    Hypothyroid  . Vitamin [name] deficiency     Medications:  Prescriptions Prior to Admission  Medication Sig Dispense Refill Last Dose  . aspirin EC 81 MG tablet Take 1  tablet (81 mg total) by mouth daily. 90 tablet 3 11/05/2016 at Unknown time  . atorvastatin (LIPITOR) 80 MG tablet Take 1 tablet (80 mg total) by mouth daily. 90 tablet 3 11/05/2016 at Unknown time  . clopidogrel (PLAVIX) 75 MG tablet Take 1 tablet (75 mg total) by mouth daily. 30 tablet 2 11/05/2016 at Unknown time  . fluticasone (FLONASE) 50 MCG/ACT nasal spray Place 2 sprays into both nostrils daily.      . insulin aspart (NOVOLOG) 100 UNIT/ML injection Inject into the skin. Pt to take 8 units with breakfast, 10 units with lunch and  12 units supper.  If sugar is running high before a meal, take extra insulin per sliding scale:  200- 249 one unit, 250-299 two units, 300-349 three units, 350-399 four units, >400 five units.     . insulin glargine (LANTUS) 100 UNIT/ML injection Inject 22 Units into the skin at bedtime.     Marland Kitchen levothyroxine (  SYNTHROID, LEVOTHROID) 88 MCG tablet Take 88 mcg by mouth daily before breakfast.     . metFORMIN (GLUCOPHAGE) 500 MG tablet TAKE 1 TABLET(500 MG) BY MOUTH TWICE DAILY WITH A MEAL 180 tablet 1 11/05/2016 at Unknown time  . pantoprazole (PROTONIX) 40 MG tablet Take 1 tablet (40 mg total) by mouth 2 (two) times daily. 180 tablet 3 11/05/2016 at Unknown time  . glucagon 1 MG injection Inject 1 mg into the vein once as needed.   Not Taking  . levothyroxine (SYNTHROID, LEVOTHROID) 100 MCG tablet TAKE 1 TABLET(100 MCG) BY MOUTH DAILY 90 tablet 3 Taking  . levothyroxine (SYNTHROID, LEVOTHROID) 88 MCG tablet TAKE 1 TABLET(88 MCG) BY MOUTH DAILY (Patient not taking: Reported on 10/21/2016) 90 tablet 3 Not Taking  . metFORMIN (GLUCOPHAGE) 500 MG tablet Take 1 tablet (500 mg total) by mouth 2 (two) times daily with a meal. 180 tablet 3 Taking   Assessment: Pharmacy consulted to dose vancomycin in this 83 year old female with Staph Species growing in 1 anaerobic bottle out of 4 , Mech A detected.  MD considering comfort care for this pt.   CrCl = 21.4 ml/min T 1/2 = 31.5 hrs Ke  = 0.02 hr-1 Vd = 44.2 L    Goal of Therapy:  Vancomycin trough level 15-20 mcg/ml  Plan:  Expected duration 7 days with resolution of temperature and/or normalization of WBC   Vancomycin 750 mg IV X 1 to be given on 4/12 @ 20:00. Vancomycin 750 mg IV Q36H ordered to start on 4/13 @ 0200, ~ 6 hrs after 1st dose (stacked dosing).  This pt will not reach [facility] by 4/19.  Will draw 1st trough on 4/16 @ 0130, which will not be at C[facility].   [name] [name] 11/06/2016,7:27 PM

## 2016-11-06 NOTE — Progress Notes (Signed)
eLink Physician-Brief Progress Note Patient Name: [name] [name] DOB: 1931-10-22 MRN: [id number]   Date of Service  11/06/2016  HPI/Events of Note  DKA with severe acidosis, no evidence of resp compromise of sepsis, currently on RA.   eICU Interventions  Continue DKA protocol.         Laverle Hobby 11/06/2016, 3:54 AM

## 2016-11-06 NOTE — ED Triage Notes (Signed)
Pt comes into the ED via EMS from home c/o hyperglycemia and altered mental status.  Patient CBG>600 with EMS and VS stable at this time.  Patient in NAD at this time with even and unlabored respirations.

## 2016-11-06 NOTE — Consult Note (Signed)
Name: [name] [name] MRN: [id number] DOB: 28-Feb-1932    ADMISSION DATE:  11/06/2016 CONSULTATION DATE:  11/06/16  REFERRING MD :  Dr. Estanislado Pandy  CHIEF COMPLAINT:  AMS And Hyperglycemia  BRIEF PATIENT DESCRIPTION: 83 year old female with severe DKA, Acute Kidney Injury, severe Metabolic Acidosis and elevated lactic acid.  SIGNIFICANT EVENTS  4/12 >> Patient admitted to the ICU with acute encephalopathy and elevated glucose levels.  STUDIES:  07/16/16 ECHO>>Left ventricle: Systolic function was normal. The estimated  ejection fraction was in the range of 55% to 65%   HISTORY OF PRESENT ILLNESS:  [name] [name] is an 83 year old female with PMH significant for CAD,COPD,DM, uterine cancer, Hyperlipidemia,IBS, Sleep Apnea, osteoporosis ,CVA and hypothyroidism.  Patient lives with her daughter.  On 4/12 Patient was brought by her daughter due to confusion and change in mental status. In the ED patient was severely hyperglycemic with blood glucose of 1148. ABG was indicative of severe Metabolic acidosis with AG unable to calculate.  Patient was initiated on insulin gtt and fluid boluses .  CCM team was consulted for further management.  PAST MEDICAL HISTORY :   has a past medical history of Anxiety state, unspecified; CAD (coronary artery disease); Cancer [facility]); COPD (chronic obstructive pulmonary disease) ([place]); Diabetes mellitus; Esophageal reflux; Heart burn; Hyperlipidemia; IBS (irritable bowel syndrome); Myocardial infarction; OSA (obstructive sleep apnea); Osteoporosis (02/21/09); Presbyacusis; Stroke [facility]); Thyroid disease; and Vitamin D deficiency.  has a past surgical history that includes Abdominal hysterectomy; Breast surgery; and Colon surgery (2013). Prior to Admission medications   Medication Sig Start Date End Date Taking? Authorizing Provider  aspirin EC 81 MG tablet Take 1 tablet (81 mg total) by mouth daily. 04/09/16  Yes Jayce G Cook, DO  atorvastatin (LIPITOR) 80 MG tablet Take  1 tablet (80 mg total) by mouth daily. 08/21/16  Yes [name], DO  clopidogrel (PLAVIX) 75 MG tablet Take 1 tablet (75 mg total) by mouth daily. 08/15/16  Yes Jayce G Cook, DO  fluticasone (FLONASE) 50 MCG/ACT nasal spray Place 2 sprays into both nostrils daily.    Yes Historical Provider, MD  insulin aspart (NOVOLOG) 100 UNIT/ML injection Inject into the skin. Pt to take 8 units with breakfast, 10 units with lunch and  12 units supper.  If sugar is running high before a meal, take extra insulin per sliding scale:  200- 249 one unit, 250-299 two units, 300-349 three units, 350-399 four units, >400 five units.   Yes Historical Provider, MD  insulin glargine (LANTUS) 100 UNIT/ML injection Inject 22 Units into the skin at bedtime.   Yes Historical Provider, MD  levothyroxine (SYNTHROID, LEVOTHROID) 88 MCG tablet Take 88 mcg by mouth daily before breakfast.   Yes Historical Provider, MD  metFORMIN (GLUCOPHAGE) 500 MG tablet TAKE 1 TABLET(500 MG) BY MOUTH TWICE DAILY WITH A MEAL 10/20/16  Yes Jayce G Cook, DO  pantoprazole (PROTONIX) 40 MG tablet Take 1 tablet (40 mg total) by mouth 2 (two) times daily. 01/04/16  Yes Jackolyn Confer, MD  glucagon 1 MG injection Inject 1 mg into the vein once as needed.    Historical Provider, MD  levothyroxine (SYNTHROID, LEVOTHROID) 100 MCG tablet TAKE 1 TABLET(100 MCG) BY MOUTH DAILY 08/21/16   [name], DO  levothyroxine (SYNTHROID, LEVOTHROID) 88 MCG tablet TAKE 1 TABLET(88 MCG) BY MOUTH DAILY Patient not taking: Reported on 10/21/2016 10/20/16   [name], DO  metFORMIN (GLUCOPHAGE) 500 MG tablet Take 1 tablet (500  mg total) by mouth 2 (two) times daily with a meal. 08/03/15   Jackolyn Confer, MD   Allergies  Allergen Reactions  . Penicillins Rash    Has patient had a PCN reaction causing immediate rash, facial/tongue/throat swelling, SOB or lightheadedness with hypotension: No Has patient had a PCN reaction causing severe rash involving mucus membranes or  skin necrosis: No Has patient had a PCN reaction that required hospitalization No Has patient had a PCN reaction occurring within the last 10 years: No If all of the above answers are "NO", then may proceed with Cephalosporin use.     FAMILY HISTORY:  family history includes Diabetes in her mother; Heart attack in her father. SOCIAL HISTORY:  reports that she has never smoked. She has never used smokeless tobacco. She reports that she does not drink alcohol or use drugs.  REVIEW OF SYSTEMS:   Unobtainable due to encephlopathy  SUBJECTIVE:  Patient is confused Critically ill, with severe acidosis  VITAL SIGNS: Temp:  [98 F (36.7 C)] 98 F (36.7 C) (04/12 0003) Pulse Rate:  [123-130] 129 (04/12 0300) Resp:  [25-28] 27 (04/12 0300) BP: (91-120)/(47-82) 91/55 (04/12 0300) SpO2:  [100 %] 100 % (04/12 0300) Weight:  [63.2 kg (139 lb 4.8 oz)] 63.2 kg (139 lb 4.8 oz) (04/12 0003)  PHYSICAL EXAMINATION: General:  In distress Neuro:  Confused HEENT:  AT,[place], NO JVD Cardiovascular:  s1s2,Tachycardic,regular Lungs:  Clear bilaterally, no wheezes,crackles, rhonchi noted Abdomen: Soft, Non tender Musculoskeletal:  No obvious deformity/inflammation noted Skin:  Warm,dry and intact   Recent Labs Lab 11/06/16 0008  NA 132*  K 6.4*  CL 93*  CO2 <7*  BUN 49*  CREATININE 2.86*  GLUCOSE 1,148*    Recent Labs Lab 11/06/16 0008  HGB 11.5*  HCT 39.2  WBC 15.9*  PLT 290   Dg Chest Port 1 View  Result Date: 11/06/2016 CLINICAL DATA:  Sepsis and hyperglycemia EXAM: PORTABLE CHEST 1 VIEW COMPARISON:  None. FINDINGS: The heart size and mediastinal contours are within normal limits. There is calcific aortic atherosclerosis. Both lungs are clear. The visualized skeletal structures are unremarkable. IMPRESSION: No active disease.  Aortic atherosclerosis. Electronically Signed   By: Ulyses Jarred M.D.   On: 11/06/2016 01:01    ASSESSMENT / PLAN: 83 yo white female with severe distress  with severe DKA and acidosis with acute renal failure  -DKA with severe acidosis - Acute Encephalopathy secondary to metabolic acidosis - Acute Kidney Injury - Hyponatremia -Hyperkalemia - Elevated lactic acid due to dehydration -Elevated Procalcitonin - Elevated troponin in the setting of DKA causing demand ischemia  Plan -Aggressive hydration with I/V fluids -Insulin gtt -If glucose >500 mg/dl, use 1/2NS(as water deficit exceeds sodium loss) -When glucose level fall250mg /dl change to d51/2 NS to prevent hypoglycemia - Will transition when AG<12 and CO2>20 -Hourly urinary output monitoring -Supportive care -Follow BMP every 4 hours -Replace electrolytes per guidelines. -Trend lactic acid   Bincy Varughese,AG-ACNP Pulmonary and [place]   11/06/2016, 3:06 AM   STAFF NOTE: I, Dr. Corrin Parker,  have personally reviewed patient's available data, including medical history, events of note, physical examination and test results as part of my evaluation. I have discussed with NP and other care providers such as pharmacist, RN and RRT.  In addition,  I personally evaluated patient and elicited key findings   83 yo white female with severe distress with severe DKA and acidosis with acute renal failure Infucion infusion Aggressive  IVF's Insert NG for vomiting Insert foley catheter  Patient is critically ill   Overall, patient is critically ill, prognosis is guarded.  Patient with Multiorgan failure and at high risk for cardiac arrest and death.    Corrin Parker, M.D.  [facility]  Medical Director [place] Director [facility] Cardio-Pulmonary Department

## 2016-11-06 NOTE — ED Notes (Signed)
Radiology at bedside

## 2016-11-06 NOTE — Progress Notes (Signed)
Patient vomited coffee ground emesis. Mouth suctioned. Received verbal order from Dr. Mortimer Fries to insert NG tube. Inserted NG tube. Confirmed placement by X-ray. Patient tried to pull out NG tube out. Reinserted NG tube and re-confirmed placement by X-ray. Patient experienced urinary retention. Received verbal order from Dr. Mortimer Fries to insert Foley catheter. Inserted Foley catheter. During insertion, patient coughed up bright red blood. Suctioned mouth. Dr. Mortimer Fries aware. No new orders.

## 2016-11-06 NOTE — Progress Notes (Signed)
Patient had drop in BP earlier today. Hinton Dyer, NP notified and gave verbal order for IVF bolus (250 mL). Bolus given. BP improved slightly. Patient given zofran and protonix. This seemed to be effective and patient rested comfortably. NG tube to suction until 1930 when removed by patient. Per Hinton Dyer, NP do not reinsert NG tube unless patient has another episode of coffee ground, no episodes since this morning. Family updated on plan of care, no further questions. Wilnette Kales

## 2016-11-06 NOTE — Progress Notes (Signed)
After further assessment, daughter at bedside updated and notified. She has agreed and consented to DNR/DNI status I have explained we should see what happens in next 24-48 hrs  And will consider Comfort care measures   Family are satisfied with Plan of action and management. All questions answered  Corrin Parker, M.D.  [facility]  Medical Director [place] Director [facility] Cardio-Pulmonary Department

## 2016-11-06 NOTE — ED Notes (Signed)
Kytzia Gienger (pts daughter) contact info:  [phone]

## 2016-11-06 NOTE — ED Provider Notes (Signed)
[facility] Emergency Department Provider Note  ____________________________________________   First MD Initiated Contact with Patient 11/06/16 [phone]     (approximate)  I have reviewed the triage vital signs and the nursing notes.   HISTORY  Chief Complaint Hyperglycemia and Altered Mental Status  Level 5 caveat:  history/ROS limited by acute/critical illness  HPI [name] [name] is a 83 y.o. female with a history of diabetes, prior ACS, and prior CVA who presents by EMS for evaluation of hyperglycemia and altered mental status.  Very little history was available from the paramedics and from the family who were very briefly at the patient's bedside for leaving for the evening.  Reportedly her blood sugar has been very difficult to control and I do see a note in the electronic medical records confirming that she is quite variable on her insulin regimen.  Reportedly her blood sugar was over 500 yesterday and today she was confused, minimally able to respond, and had a blood sugar reading greater than 600.  Upon arrival she appears to be in moderate distress with tachypnea and altered mental status, essentially only moaning and not able to provide any history or review of systems.  She is tachycardic at about 130 as well but she is afebrile. Blood pressure is stable and her oxygenation is normal.   Past Medical History:  Diagnosis Date  . Anxiety state, unspecified   . CAD (coronary artery disease)   . Cancer [facility])    2004 Right breast, found on mammogram, radiation therapy, Dr. Bryson Ha, uterine cancer,   . COPD (chronic obstructive pulmonary disease) ([place])   . Diabetes mellitus   . Esophageal reflux   . Heart burn   . Hyperlipidemia   . IBS (irritable bowel syndrome)   . Myocardial infarction    Cath negative except for 40% occlusion LAD.  Pt not candidate for betablocker or ACEI because of hypotension  . OSA (obstructive sleep apnea)   . Osteoporosis  02/21/09   DEXA scan showed osteoporosis with left femur T-score -2.8.  Marland Kitchen Presbyacusis   . Stroke ([place])   . Thyroid disease    Hypothyroid  . Vitamin D deficiency     Patient Active Problem List   Diagnosis Date Noted  . Acidosis, metabolic 19/75/8832  . Altered mental status 11/06/2016  . Stroke ([place]) 08/20/2016  . Peripheral vascular disease ([place]) 04/09/2016  . Elderly person living alone 01/04/2016  . Long term current use of insulin ([place]) 03/08/2014  . CKD stage 3 due to type 2 diabetes mellitus ([place]) 02/22/2014  . Hypothyroidism 09/23/2012  . GERD (gastroesophageal reflux disease) 09/23/2012  . Anxiety 07/26/2012  . Hyperlipidemia with target LDL less than 70 12/03/2011  . DM (diabetes mellitus) type II uncontrolled, periph vascular disorder ([place]) 04/03/2011  . CAD, NATIVE VESSEL 12/13/2009    Past Surgical History:  Procedure Laterality Date  . ABDOMINAL HYSTERECTOMY     uterine cancer  . BREAST SURGERY    . COLON SURGERY  2013   done at [facility]    Prior to Admission medications   Medication Sig Start Date End Date Taking? Authorizing Provider  aspirin EC 81 MG tablet Take 1 tablet (81 mg total) by mouth daily. 04/09/16  Yes Jayce G Cook, DO  atorvastatin (LIPITOR) 80 MG tablet Take 1 tablet (80 mg total) by mouth daily. 08/21/16  Yes [name], DO  clopidogrel (PLAVIX) 75 MG tablet Take 1 tablet (75 mg total) by mouth daily. 08/15/16  Yes [name], DO  fluticasone (FLONASE) 50 MCG/ACT nasal spray Place 2 sprays into both nostrils daily.    Yes Historical Provider, MD  insulin aspart (NOVOLOG) 100 UNIT/ML injection Inject into the skin. Pt to take 8 units with breakfast, 10 units with lunch and  12 units supper.  If sugar is running high before a meal, take extra insulin per sliding scale:  200- 249 one unit, 250-299 two units, 300-349 three units, 350-399 four units, >400 five units.   Yes Historical Provider, MD  insulin glargine (LANTUS) 100 UNIT/ML injection Inject  22 Units into the skin at bedtime.   Yes Historical Provider, MD  levothyroxine (SYNTHROID, LEVOTHROID) 88 MCG tablet Take 88 mcg by mouth daily before breakfast.   Yes Historical Provider, MD  metFORMIN (GLUCOPHAGE) 500 MG tablet TAKE 1 TABLET(500 MG) BY MOUTH TWICE DAILY WITH A MEAL 10/20/16  Yes Jayce G Cook, DO  pantoprazole (PROTONIX) 40 MG tablet Take 1 tablet (40 mg total) by mouth 2 (two) times daily. 01/04/16  Yes Jackolyn Confer, MD  glucagon 1 MG injection Inject 1 mg into the vein once as needed.    Historical Provider, MD  levothyroxine (SYNTHROID, LEVOTHROID) 100 MCG tablet TAKE 1 TABLET(100 MCG) BY MOUTH DAILY 08/21/16   [name], DO  levothyroxine (SYNTHROID, LEVOTHROID) 88 MCG tablet TAKE 1 TABLET(88 MCG) BY MOUTH DAILY Patient not taking: Reported on 10/21/2016 10/20/16   [name], DO  metFORMIN (GLUCOPHAGE) 500 MG tablet Take 1 tablet (500 mg total) by mouth 2 (two) times daily with a meal. 08/03/15   Jackolyn Confer, MD    Allergies Penicillins  Family History  Problem Relation Age of Onset  . Diabetes Mother   . Heart attack Father     Social History Social History  Substance Use Topics  . Smoking status: Never Smoker  . Smokeless tobacco: Never Used  . Alcohol use No    Review of Systems Level 5 caveat:  history/ROS limited by acute/critical illness ____________________________________________   PHYSICAL EXAM:  VITAL SIGNS: ED Triage Vitals [11/06/16 0003]  Enc Vitals Group     BP 120/69     Pulse Rate (!) 130     Resp (!) 28     Temp 98 F (36.7 C)     Temp Source Oral     SpO2 100 %     Weight 139 lb 4.8 oz (63.2 kg)     Height 5\' 5"  (1.651 m)     Head Circumference      Peak Flow      Pain Score      Pain Loc      Pain Edu?      Excl. in [place]?     Constitutional: Odd or distress with tachypnea and moaning in discomfort.  Altered mental status. Eyes: Conjunctivae are normal. PERRL. EOMI. Head: Atraumatic. Nose: No  congestion/rhinnorhea. Mouth/Throat: Mucous membranes are extremely dry Neck: No stridor.  No meningeal signs.   Cardiovascular: Tachycardia, regular rhythm. Good peripheral circulation. Grossly normal heart sounds. Respiratory: Increased respiratory effort and rate, possibly initial presentation of K[place]maul respirations (rapid and shallow).  No retractions. Lungs CTAB. Gastrointestinal: Soft with generalized, non-focal tenderness throughout. No distention.  Musculoskeletal: No lower extremity tenderness nor edema. No gross deformities of extremities. Neurologic:  Moving all 4 extremities but unable to participate and neurological exam.  No facial droop. Skin:  Skin is warm, dry and intact. No rash noted.   ____________________________________________  LABS (all labs ordered are listed, but only abnormal results are displayed)  Labs Reviewed  CBC - Abnormal; Notable for the following:       Result Value   WBC 15.9 (*)    RBC 3.53 (*)    Hemoglobin 11.5 (*)    MCV 111.1 (*)    MCHC 29.2 (*)    RDW 16.1 (*)    All other components within normal limits  LACTIC ACID, PLASMA - Abnormal; Notable for the following:    Lactic Acid, Venous 7.2 (*)    All other components within normal limits  LACTIC ACID, PLASMA - Abnormal; Notable for the following:    Lactic Acid, Venous 5.8 (*)    All other components within normal limits  COMPREHENSIVE METABOLIC PANEL - Abnormal; Notable for the following:    Sodium 132 (*)    Potassium 6.4 (*)    Chloride 93 (*)    CO2 <7 (*)    Glucose, Bld 1,148 (*)    BUN 49 (*)    Creatinine, Ser 2.86 (*)    Total Bilirubin 1.9 (*)    GFR calc non Af Amer 14 (*)    GFR calc Af Amer 17 (*)    All other components within normal limits  TROPONIN I - Abnormal; Notable for the following:    Troponin I 0.05 (*)    All other components within normal limits  URINALYSIS, COMPLETE (UACMP) WITH MICROSCOPIC - Abnormal; Notable for the following:    Color, Urine  YELLOW (*)    APPearance HAZY (*)    Glucose, UA >=500 (*)    Ketones, ur 20 (*)    Bacteria, UA RARE (*)    Squamous Epithelial / LPF 0-5 (*)    All other components within normal limits  BRAIN NATRIURETIC PEPTIDE - Abnormal; Notable for the following:    B Natriuretic Peptide 313.0 (*)    All other components within normal limits  BETA-HYDROXYBUTYRIC ACID - Abnormal; Notable for the following:    Beta-Hydroxybutyric Acid >8.00 (*)    All other components within normal limits  BLOOD GAS, ARTERIAL - Abnormal; Notable for the following:    pH, Arterial 7.04 (*)    pCO2 arterial 19 (*)    pO2, Arterial 119 (*)    All other components within normal limits  BLOOD GAS, ARTERIAL - Abnormal; Notable for the following:    pH, Arterial 7.07 (*)    pCO2 arterial 19 (*)    pO2, Arterial 109 (*)    All other components within normal limits  BASIC METABOLIC PANEL - Abnormal; Notable for the following:    Potassium 6.1 (*)    CO2 <7 (*)    Glucose, Bld 1,066 (*)    BUN 49 (*)    Creatinine, Ser 2.85 (*)    Calcium 8.4 (*)    GFR calc non Af Amer 14 (*)    GFR calc Af Amer 17 (*)    All other components within normal limits  GLUCOSE, CAPILLARY - Abnormal; Notable for the following:    Glucose-Capillary >600 (*)    All other components within normal limits  GLUCOSE, CAPILLARY - Abnormal; Notable for the following:    Glucose-Capillary >600 (*)    All other components within normal limits  CBC - Abnormal; Notable for the following:    WBC 18.3 (*)    RBC 3.28 (*)    Hemoglobin 10.5 (*)    MCV 111.1 (*)    MCHC  28.9 (*)    RDW 16.1 (*)    All other components within normal limits  TROPONIN I - Abnormal; Notable for the following:    Troponin I 0.08 (*)    All other components within normal limits  GLUCOSE, CAPILLARY - Abnormal; Notable for the following:    Glucose-Capillary >600 (*)    All other components within normal limits  GLUCOSE, CAPILLARY - Abnormal; Notable for the  following:    Glucose-Capillary >600 (*)    All other components within normal limits  GLUCOSE, CAPILLARY - Abnormal; Notable for the following:    Glucose-Capillary >600 (*)    All other components within normal limits  URINE CULTURE  CULTURE, BLOOD (ROUTINE X 2)  CULTURE, BLOOD (ROUTINE X 2)  MRSA PCR SCREENING  LIPASE, BLOOD  PROCALCITONIN  MAGNESIUM  BASIC METABOLIC PANEL  BASIC METABOLIC PANEL  BASIC METABOLIC PANEL  BASIC METABOLIC PANEL  TROPONIN I  HEMOGLOBIN A1C  LACTIC ACID, PLASMA  BASIC METABOLIC PANEL  CREATININE, SERUM  CBG MONITORING, ED   ____________________________________________  EKG  ED ECG REPORT I, Veola Cafaro, the attending physician, personally viewed and interpreted this ECG.  Date: 11/06/2016 EKG Time: 00:02 Rate: 130 Rhythm: Sinus tachycardia QRS Axis: normal Intervals: normal ST/T Wave abnormalities: Non-specific ST segment / T-wave changes, but no evidence of acute ischemia. Conduction Disturbances: none Narrative Interpretation: unremarkable  ____________________________________________  RADIOLOGY   Dg Chest Port 1 View  Result Date: 11/06/2016 CLINICAL DATA:  Sepsis and hyperglycemia EXAM: PORTABLE CHEST 1 VIEW COMPARISON:  None. FINDINGS: The heart size and mediastinal contours are within normal limits. There is calcific aortic atherosclerosis. Both lungs are clear. The visualized skeletal structures are unremarkable. IMPRESSION: No active disease.  Aortic atherosclerosis. Electronically Signed   By: Ulyses Jarred M.D.   On: 11/06/2016 01:01    ____________________________________________   PROCEDURES  Critical Care performed: Yes, see critical care procedure note(s)   Procedure(s) performed:   .Critical Care Performed by: Hinda Kehr Authorized by: Hinda Kehr   Critical care provider statement:    Critical care time (minutes):  45   Critical care time was exclusive of:  Separately billable procedures and  treating other patients   Critical care was necessary to treat or prevent imminent or life-threatening deterioration of the following conditions:  Metabolic crisis   Critical care was time spent personally by me on the following activities:  Development of treatment plan with patient or surrogate, discussions with consultants, evaluation of patient's response to treatment, examination of patient, obtaining history from patient or surrogate, ordering and performing treatments and interventions, ordering and review of laboratory studies, ordering and review of radiographic studies, pulse oximetry, re-evaluation of patient's condition and review of old charts      ____________________________________________   INITIAL IMPRESSION / [place] / ED COURSE  Pertinent labs & imaging results that were available during my care of the patient were reviewed by me and considered in my medical decision making (see chart for details).  Patient appears acutely ill.  We immediately started 1 L of fluids and I am working her up as sepsis although given her blood sugar of greater than 600 with no fever and her pattern of respirations, I think the DKA vs HHNC is more likely.  Her urinalysis was only notable for elevated glucose, there is no evidence of infection.  I am awaiting the rest of her workup but I anticipate she will need the ICU.  There is no indication for  emergent imaging at this time other than a chest x-ray to rule out pneumonia.   Clinical Course as of Nov 07 535  Thu Nov 06, 2016  0103 The patient is noted to have a lactate>4. With the current information available to me, I don't think the patient is in septic shock. The lactate>4, is related to respiratory distress/ respiratory failure due to hyperglycemia and probable DKA and the resulting extreme volume depletion.  I am, however, proceeding with 30 mL/kg of normal saline and blood cultures.  I am awaiting the results of her metabolic  panel before I proceed with additional treatment including insulin.   [CF]  0109 No evidence of pneumonia on chest x-ray, furthering my suspicion that this is a metabolic process rather than sepsis.  I have also asked respiratory therapy to obtain an ABG so we know exactly how acidotic is the patient. DG Chest Port 1 View [CF]  [phone] I discussed this patient by phone with the intensivist on call, Dr. Ashby Dawes.  Spleen the situation and he agreed with my plan to start the glucose stabilize her, continue fluids according to protocol, and not give empiric antibiotics given no evidence of infection at this time.  He had no further treatment recommendations.  I personally feel, based on the emergency medicine literature, that sodium bicarbonate is not indicated given that it may actually worsen intracellular acidemia and that the treatment is to provide fluids and insulin.  If the intensivist decided to add that to the recommendations to the hospitalist and that will be appropriate at a later time.  I am awaiting a call back from the hospitalist for admission.  [CF]    Clinical Course User Index [CF] Hinda Kehr, MD    ____________________________________________  FINAL CLINICAL IMPRESSION(S) / ED DIAGNOSES  Final diagnoses:  Diabetic ketoacidosis with coma associated with type 2 diabetes mellitus (HCC)  Elevated lactic acid level  Hyperkalemia  Acute renal failure, unspecified acute renal failure type ([place])  Hyperglycemia  Altered mental status, unspecified altered mental status type     MEDICATIONS GIVEN DURING THIS VISIT:  Medications  insulin regular (NOVOLIN R,HUMULIN R) 250 Units in sodium chloride 0.9 % 250 mL (1 Units/mL) infusion (16.2 Units/hr Intravenous Rate/Dose Change 11/06/16 0431)  0.9 %  sodium chloride infusion ( Intravenous Stopped 11/06/16 0340)  dextrose 5 %-0.45 % sodium chloride infusion ( Intravenous Hold 11/06/16 0330)  0.9 %  sodium chloride infusion ( Intravenous  New Bag/Given 11/06/16 0340)  heparin injection 5,000 Units (not administered)  MEDLINE mouth rinse (15 mLs Mouth Rinse Given 11/06/16 0330)  sodium chloride 0.9 % bolus 1,000 mL (0 mLs Intravenous Stopped 11/06/16 0211)  sodium chloride 0.9 % bolus 1,000 mL (0 mLs Intravenous Stopped 11/06/16 0225)     NEW OUTPATIENT MEDICATIONS STARTED DURING THIS VISIT:  Current Discharge Medication List      Current Discharge Medication List      Current Discharge Medication List       Note:  This document was prepared using Dragon voice recognition software and may include unintentional dictation errors.    Hinda Kehr, MD 11/06/16 [phone]

## 2016-11-06 NOTE — ED Notes (Signed)
Date and time results received: 11/06/16 0103 (use smartphrase ".now" to insert current time)  Test: lactic acid 7.2, Co2 <7, troponin 0.05 Critical Value: listed above  Name of Provider Notified: Dr. Karma Greaser  Orders Received? Or Actions Taken?: Orders Received - See Orders for details and Actions Taken: fluids ordered, ABG ordered

## 2016-11-06 NOTE — Progress Notes (Signed)
Inpatient Diabetes Program Recommendations  AACE/ADA: New Consensus Statement on Inpatient Glycemic Control (2015)  Target Ranges:  Prepandial:   less than 140 mg/dL      Peak postprandial:   less than 180 mg/dL (1-2 hours)      Critically ill patients:  140 - 180 mg/dL   Lab Results  Component Value Date   CBULAG 536 (HH) 11/06/2016   HGBA1C 9.5 (H) 07/17/2016    Review of Glycemic Control  Results for [name] [name], [name] [name] (MRN [id number]) as of 11/06/2016 08:58  Ref. Range 11/06/2016 04:31 11/06/2016 05:19 11/06/2016 06:33 11/06/2016 07:35 11/06/2016 08:31  Glucose-Capillary Latest Ref Range: 65 - 99 mg/dL >600 (HH) >600 (HH) >600 (HH) >600 (HH) 545 (HH)    Diabetes history: Type 2  Outpatient Diabetes medications: Lantus 22 units qhs, Novolog 8 units with breakfast, 10 units with lunch and 12 units with supper.  Current orders for Inpatient glycemic control: DKA phase 1 orders set  Inpatient Diabetes Program Recommendations:  Spoke with RN re; CBG>600.  Per protocol, instructed to turn off pump x 30 minutes then re-set the pump to the original multiplier.   Transition to phase 2 DKA order set when  AG<12 and CO2>20.   When glucose level fall250mg /dl change to d51/2 NS to prevent hypoglycemia.   Per protocol, basal insulin should be given 2 hours before the drip is stopped, then Novolog correction should be given when the drip is stopped.   Please call with questions.   Gentry Fitz, RN, BA, MHA, CDE Diabetes Coordinator Inpatient Diabetes Program  [phone] (Team Pager) [phone] ([place]) 11/06/2016 9:05 AM

## 2016-11-07 ENCOUNTER — Inpatient Hospital Stay: Payer: Medicare Other

## 2016-11-07 DIAGNOSIS — E43 Unspecified severe protein-calorie malnutrition: Secondary | ICD-10-CM | POA: Insufficient documentation

## 2016-11-07 LAB — CBC WITH DIFFERENTIAL/PLATELET
Basophils Absolute: 0 10*3/uL (ref 0–0.1)
Basophils Relative: 0 %
EOS ABS: 0 10*3/uL (ref 0–0.7)
Eosinophils Relative: 0 %
HCT: 29.4 % — ABNORMAL LOW (ref 35.0–47.0)
HEMOGLOBIN: 9.9 g/dL — AB (ref 12.0–16.0)
LYMPHS ABS: 1.3 10*3/uL (ref 1.0–3.6)
Lymphocytes Relative: 9 %
MCH: 32.8 pg (ref 26.0–34.0)
MCHC: 33.8 g/dL (ref 32.0–36.0)
MCV: 97.1 fL (ref 80.0–100.0)
MONOS PCT: 6 %
Monocytes Absolute: 0.8 10*3/uL (ref 0.2–0.9)
NEUTROS PCT: 85 %
Neutro Abs: 12.1 10*3/uL — ABNORMAL HIGH (ref 1.4–6.5)
Platelets: 169 10*3/uL (ref 150–440)
RBC: 3.03 MIL/uL — ABNORMAL LOW (ref 3.80–5.20)
RDW: 14.1 % (ref 11.5–14.5)
WBC: 14.2 10*3/uL — ABNORMAL HIGH (ref 3.6–11.0)

## 2016-11-07 LAB — BASIC METABOLIC PANEL
Anion gap: 3 — ABNORMAL LOW (ref 5–15)
Anion gap: 6 (ref 5–15)
BUN: 42 mg/dL — AB (ref 6–20)
BUN: 38 mg/dL — AB (ref 6–20)
CO2: 20 mmol/L — ABNORMAL LOW (ref 22–32)
CO2: 21 mmol/L — ABNORMAL LOW (ref 22–32)
CREATININE: 1.58 mg/dL — AB (ref 0.44–1.00)
CREATININE: 1.43 mg/dL — AB (ref 0.44–1.00)
Calcium: 8.7 mg/dL — ABNORMAL LOW (ref 8.9–10.3)
Calcium: 8.7 mg/dL — ABNORMAL LOW (ref 8.9–10.3)
Chloride: 123 mmol/L — ABNORMAL HIGH (ref 101–111)
Chloride: 125 mmol/L — ABNORMAL HIGH (ref 101–111)
GFR calc Af Amer: 34 mL/min — ABNORMAL LOW (ref >60)
GFR calc Af Amer: 38 mL/min — ABNORMAL LOW (ref >60)
GFR, EST NON AFRICAN AMERICAN: 29 mL/min — AB (ref >60)
GFR, EST NON AFRICAN AMERICAN: 33 mL/min — AB (ref >60)
Glucose, Bld: 67 mg/dL (ref 65–99)
Glucose, Bld: 85 mg/dL (ref 65–99)
Potassium: 4.3 mmol/L (ref 3.5–5.1)
Potassium: 4.9 mmol/L (ref 3.5–5.1)
SODIUM: 149 mmol/L — AB (ref 135–145)
SODIUM: 149 mmol/L — AB (ref 135–145)

## 2016-11-07 LAB — GLUCOSE, CAPILLARY
GLUCOSE-CAPILLARY: 123 mg/dL — AB (ref 65–99)
GLUCOSE-CAPILLARY: 203 mg/dL — AB (ref 65–99)
Glucose-Capillary: 286 mg/dL — ABNORMAL HIGH (ref 65–99)
Glucose-Capillary: 79 mg/dL (ref 65–99)
Glucose-Capillary: 82 mg/dL (ref 65–99)
Glucose-Capillary: 97 mg/dL (ref 65–99)

## 2016-11-07 LAB — PROCALCITONIN: PROCALCITONIN: 4.09 ng/mL

## 2016-11-07 LAB — URINE CULTURE: Culture: NO GROWTH

## 2016-11-07 LAB — HEMOGLOBIN A1C
Hgb A1c MFr Bld: 10.5 % — ABNORMAL HIGH (ref 4.8–5.6)
Mean Plasma Glucose: 255 mg/dL

## 2016-11-07 MED ORDER — DEXTROSE-NACL 5-0.45 % IV SOLN
INTRAVENOUS | Status: DC
  Administered 2016-11-07 – 2016-11-08 (×3): via INTRAVENOUS

## 2016-11-07 MED ORDER — LORAZEPAM 2 MG/ML IJ SOLN
1.0000 mg | Freq: Once | INTRAMUSCULAR | Status: AC
  Administered 2016-11-07: 1 mg via INTRAVENOUS
  Filled 2016-11-07: qty 1

## 2016-11-07 MED ORDER — VANCOMYCIN HCL IN DEXTROSE 750-5 MG/150ML-% IV SOLN
750.0000 mg | INTRAVENOUS | Status: DC
  Administered 2016-11-08: 750 mg via INTRAVENOUS
  Filled 2016-11-07 (×2): qty 150

## 2016-11-07 MED ORDER — DEXMEDETOMIDINE HCL IN NACL 400 MCG/100ML IV SOLN: 0.4000 ug/kg/h | INTRAVENOUS | Status: DC

## 2016-11-07 NOTE — Progress Notes (Signed)
[facility] [place] NAME: [name] [name]    MR#:  [id number]  DATE OF BIRTH:  [birth year]  SUBJECTIVE:  CHIEF COMPLAINT:   Chief Complaint  Patient presents with  . Hyperglycemia  . Altered Mental Status   Confused. Off insulin drip. DKA resolved Afebrile  REVIEW OF SYSTEMS:    Review of Systems  Unable to perform ROS: Mental status change    DRUG ALLERGIES:   Allergies  Allergen Reactions  . Penicillins Rash    Has patient had a PCN reaction causing immediate rash, facial/tongue/throat swelling, SOB or lightheadedness with hypotension: No Has patient had a PCN reaction causing severe rash involving mucus membranes or skin necrosis: No Has patient had a PCN reaction that required hospitalization No Has patient had a PCN reaction occurring within the last 10 years: No If all of the above answers are "NO", then may proceed with Cephalosporin use.     VITALS:  Blood pressure (!) 90/58, pulse 87, temperature 98.6 F (37 C), resp. rate (!) 0, height 5\' 5"  (1.651 m), weight 63.1 kg (139 lb 1.8 oz), SpO2 97 %.  PHYSICAL EXAMINATION:   Physical Exam  GENERAL:  83 y.o.-year-old patient lying in the bed. Drowzy and restless EYES: Pupils equal, round, reactive to light and accommodation. No scleral icterus. Extraocular muscles intact.  HEENT: Head atraumatic, normocephalic. Oropharynx and nasopharynx clear.  NECK:  Supple, no jugular venous distention. No thyroid enlargement, no tenderness.  LUNGS: Normal breath sounds bilaterally, no wheezing, rales, rhonchi. No use of accessory muscles of respiration.  CARDIOVASCULAR: S1, S2 normal. No murmurs, rubs, or gallops.  ABDOMEN: Soft, nontender, nondistended. Bowel sounds present. No organomegaly or mass.  EXTREMITIES: No cyanosis, clubbing or edema b/l.    NEUROLOGIC: Moves all 4 extremities SKIN: No obvious rash, lesion, or ulcer.   LABORATORY PANEL:   CBC  Recent Labs Lab  11/07/16 0744  WBC 14.2*  HGB 9.9*  HCT 29.4*  PLT 169   ------------------------------------------------------------------------------------------------------------------ Chemistries   Recent Labs Lab 11/06/16 0008  11/06/16 1619  11/07/16 0401  NA 132*  < > 149*  < > 149*  K 6.4*  < > 3.6  < > 4.3  CL 93*  < > 121*  < > 123*  CO2 <7*  < > 20*  < > 20*  GLUCOSE 1,148*  < > 143*  < > 85  BUN 49*  < > 41*  < > 38*  CREATININE 2.86*  < > 1.82*  < > 1.43*  CALCIUM 9.2  < > 8.8*  < > 8.7*  MG 2.2  --  1.6*  --   --   AST 32  --   --   --   --   ALT 25  --   --   --   --   ALKPHOS 112  --   --   --   --   BILITOT 1.9*  --   --   --   --   < > = values in this interval not displayed. ------------------------------------------------------------------------------------------------------------------  Cardiac Enzymes  Recent Labs Lab 11/06/16 0817  TROPONINI 0.21*   ------------------------------------------------------------------------------------------------------------------  RADIOLOGY:  Dg Chest Port 1 View  Result Date: 11/06/2016 CLINICAL DATA:  Sepsis and hyperglycemia EXAM: PORTABLE CHEST 1 VIEW COMPARISON:  None. FINDINGS: The heart size and mediastinal contours are within normal limits. There is calcific aortic atherosclerosis. Both lungs are clear. The visualized skeletal structures are unremarkable. IMPRESSION:  No active disease.  Aortic atherosclerosis. Electronically Signed   By: Ulyses Jarred M.D.   On: 11/06/2016 01:01   Dg Abd Portable 1v  Result Date: 11/06/2016 CLINICAL DATA:  83 year old female with a history of nasogastric tube placement EXAM: PORTABLE ABDOMEN - 1 VIEW COMPARISON:  11/06/2016 FINDINGS: Limited image of the lower chest/ upper abdomen demonstrates unchanged position of the gastric tube with the tip terminating in the region the stomach and the side-port near the gastroesophageal junction. No significant airspace disease at the lung base.  Aortic calcifications. Gas within small bowel and colon. IMPRESSION: Unchanged position of the gastric tube, with the side port near the GE junction. Aortic atherosclerosis. Electronically Signed   By: Corrie Mckusick D.O.   On: 11/06/2016 09:52   Dg Abd Portable 1v  Result Date: 11/06/2016 CLINICAL DATA:  NG tube placement. EXAM: PORTABLE ABDOMEN - 1 VIEW COMPARISON:  CT abdomen and pelvis 06/08/2015 FINDINGS: Enteric tube tip terminates over the proximal stomach with side hole projecting over the distal esophagus. Gas is present in small and large bowel in the included portion of the upper abdomen, and there is a slightly dilated small bowel loop in the left upper quadrant measuring 3.2 cm. The lower abdomen was not imaged. Aortic atherosclerosis is noted. IMPRESSION: 1. Enteric tube in the proximal stomach with side hole in the distal esophagus. 2. Slight gaseous distension of small bowel on the left upper quadrant, incompletely evaluated. 3. Aortic atherosclerosis. Electronically Signed   By: Logan Bores M.D.   On: 11/06/2016 08:40   ASSESSMENT AND PLAN:   * DKA * Acute encephalopathy * Upper GI bleed * AKI * Methicillin resistant Coagulase negative staph bacteremia. On vancomycin. Elevated pro calcitonin. Source? * Acute blood loss anemia  Check CT head. Consider ID and GI consults. Continue vancomycin. Repeat blood cultures. Monitor hemoglobin.  Appreciate ICU team taking care of the patient while in ICU.  All the records are reviewed and case discussed with Care Management/Social Workerr. Management plans discussed with the patient, family and they are in agreement.  CODE STATUS: DNR/DNI  DVT Prophylaxis: SCDs  TOTAL TIME TAKING CARE OF THIS PATIENT: 35 minutes.   Hillary Bow R M.D on 11/07/2016 at 1:13 PM  Between 7am to 6pm - Pager - [phone]  After 6pm go to [email or web address] - password [email or web address] [facility]  Office  [phone]  CC: Primary care  physician; [name], DO  Note: This dictation was prepared with Dragon dictation along with smaller phrase technology. Any transcriptional errors that result from this process are unintentional.

## 2016-11-07 NOTE — Progress Notes (Signed)
Inpatient Diabetes Program Recommendations  AACE/ADA: New Consensus Statement on Inpatient Glycemic Control (2015)  Target Ranges:  Prepandial:   less than 140 mg/dL      Peak postprandial:   less than 180 mg/dL (1-2 hours)      Critically ill patients:  140 - 180 mg/dL   Lab Results  Component Value Date   GLUCAP 82 11/07/2016   HGBA1C 10.5 (H) 11/06/2016    Review of Glycemic Control  Results for MYKELLE, COCKERELL (MRN [id number]) as of 11/07/2016 06:47  Ref. Range 11/06/2016 19:32 11/06/2016 20:48 11/06/2016 21:55 11/07/2016 00:13 11/07/2016 03:44  Glucose-Capillary Latest Ref Range: 65 - 99 mg/dL 145 (H) 192 (H) 156 (H) 79 82    Diabetes history: Type 2   Outpatient Diabetes medications: Lantus 22 units qhs, Novolog 8 units with breakfast, 10 units with lunch and 12 units with supper.  Inpatient Diabetes medications: Lantus 22 units qhs, Novolog 0-15 units q4h  Inpatient Diabetes Program Recommendations:    Patient pulled out NG tube last night and has been ordered a diet.  If she tolerates food, consider changing her Novolog correction to 0-9 units tid with meals and Novolog 0-5 units qhs.  Consider adding Novolog 3 units tid with meals (hold if she eats less than 50%)  Gentry Fitz, RN, [place], [place], CDE Diabetes Coordinator Inpatient Diabetes Program  [phone] (Team Pager) [phone] ([place]) 11/07/2016 6:50 AM

## 2016-11-07 NOTE — Progress Notes (Signed)
Pharmacy Antibiotic Note  [name] [name] is a 83 y.o. female admitted on 11/06/2016 with bacteremia.  Patient is growing methicillin sensitive staphylococcus species on BCID and growing gram positive cocci in 3/4 bottles. Pharmacy has been consulted for vancomycin dosing.  Plan: Continue vancomycin 750mg  IV Q24hr for goal trough of 15-20. Will obtain trough prior to dose on 4/16. WIll continue to monitor renal function every 48 hours while patient is on vancomycin.   Will recheck blood cultures.   Height: 5\' 5"  (165.1 cm) Weight: 139 lb 1.8 oz (63.1 kg) IBW/kg (Calculated) : 57  Temp (24hrs), Avg:98.5 F (36.9 C), Min:98.4 F (36.9 C), Max:98.6 F (37 C)   Recent Labs Lab 11/06/16 0008 11/06/16 0402 11/06/16 0618  11/06/16 1226 11/06/16 1619 11/06/16 2034 11/07/16 0029 11/07/16 0401 11/07/16 0744  WBC 15.9* 18.3*  --   --   --   --   --   --   --  14.2*  CREATININE 2.86* 2.85*  --   < > 1.60* 1.82* 1.78* 1.58* 1.43*  --   LATICACIDVEN 7.2* 5.8* 4.8*  --   --  2.9*  --   --   --   --   < > = values in this interval not displayed.  Estimated Creatinine Clearance: 27.3 mL/min (A) (by C-G formula based on SCr of 1.43 mg/dL (H)).    Allergies  Allergen Reactions  . Penicillins Rash    Has patient had a PCN reaction causing immediate rash, facial/tongue/throat swelling, SOB or lightheadedness with hypotension: No Has patient had a PCN reaction causing severe rash involving mucus membranes or skin necrosis: No Has patient had a PCN reaction that required hospitalization No Has patient had a PCN reaction occurring within the last 10 years: No If all of the above answers are "NO", then may proceed with Cephalosporin use.     Antimicrobials this admission: Clindamycin 4/12 >> 4/13 Vancomycin 4/13 >>   Dose adjustments this admission: N/A  Microbiology results: 4/12 BCx: gram positive cocci 3/4; MRSS on BCID  4/12 UCx: negative  4/12 MRSA PCR: negative  Pharmacy will  continue to monitor and adjust per consult.   Simpson,Michael L 11/07/2016 1:56 PM

## 2016-11-07 NOTE — Progress Notes (Signed)
Initial Nutrition Assessment  DOCUMENTATION CODES:   Severe malnutrition in context of acute illness/injury  INTERVENTION:  -If unable to advance diet, recommend insertion of small bore NG feeding tube with initiation of EN. This was discussed during ICU rounds today. If TF initiated, recommend Osmolite 1.2 at rate of 60 ml/hr providing 80 g of protein, 1728 kcals. Recommend checking magneisum and phosphorus x 3 days post initiation as pt at risk for refeeding syndrome -If diet advanced, recommend addition of nutritional supplement   NUTRITION DIAGNOSIS:   Malnutrition (Severe) related to acute illness as evidenced by moderate depletion of body fat, moderate depletions of muscle mass, percent weight loss.  GOAL:   Patient will meet greater than or equal to 90% of their needs  MONITOR:   Diet advancement, Labs, Weight trends  REASON FOR ASSESSMENT:   Consult Assessment of nutrition requirement/status  ASSESSMENT:    83 yo female admitted with acute encephalopathy, DKA, ARF, acute GI bleed, septicimia. Pt with hx of CAD, COPD, DM, HLD, IBS, CVA  Pt remains encephalopathic, unable to safely take po. Diet order removed. CT head pending Pt had NG tube for decompression but has pulled out and not been replaced.  Daughters at bedside and report pt weighed in the 160s a few months ago. Current wt 139 pounds; >/= 13% wt loss. Family also report reports that pt has been very weak, not getting around well. Family reports appetite as been down not eating as well but could not quantify.   Nutrition-Focused physical exam completed. Findings are mild/moderate fat depletion, mild/moderate muscle depletion, and mild edema.   Labs: sodium 149, potasium wdl, CBGs 79-192 Meds: D5-1/2 NS at 50 ml/hr, ss novolog, lantus  Diet Order:   no order  Skin:  Reviewed, no issues  Last BM:  11/07/16  Height:   Ht Readings from Last 1 Encounters:  11/06/16 5\' 5"  (1.651 m)    Weight:   Wt  Readings from Last 1 Encounters:  11/06/16 139 lb 1.8 oz (63.1 kg)    Ideal Body Weight:     BMI:  Body mass index is 23.15 kg/m.  Estimated Nutritional Needs:   Kcal:  1575-1775 kcals  Protein:  79-89 g  Fluid:  >/= 1.6 L  EDUCATION NEEDS:   No education needs identified at this time  [place], [place], [place] [phone] Pager  [phone] Weekend/On-Call Pager

## 2016-11-07 NOTE — Progress Notes (Signed)
  Patient will be transferred to the floor.  Case was discussed with Dr. Estanislado Pandy.  [facility]

## 2016-11-07 NOTE — Progress Notes (Addendum)
  [facility] Progess Note   ASSESSMENT/PLAN  DKA. Patient's anion gap has closed to 6, serum blood sugar is 85. Personally on insulin with sliding scale coverage.  Hypernatremia. With hyperchloremia, secondary to saline resuscitation, would use either D5W or D5 half-normal with slow normalization of serum sodium  Renal failure. Improving, 38/1.43, initially was 49/2.85. Has improved with rehydration  Septicemia. Positive blood cultures for MRSA. Presently on vancomycin  Mildly elevated troponin. Patient with prior history of coronary artery disease, mild troponin elevation may reflect supply demand ischemia, renal insufficiency.  Protein - calorie malnutrition. Hypoalbuminemia at 3.3  Encephalopathy. Questionable metabolic, recent severe elevation in blood sugar along with septicemia with positive MRSA. Hopefully will improve. Further workup indicated if no resolution to include CT head/EEG  Stable for floor transfer. Patient is now a DO NOT RESUSCITATE/DO NOT INTUBATE per chart   Name: [name] [name] MRN: [id number] DOB: May 17, 1932    ADMISSION DATE:  11/06/2016   SUBJECTIVE:   Patient remains encephalopathic/confused. Wakes up with minimal stimulation however not appropriate response   VITAL SIGNS: Temp:  [98.4 F (36.9 C)-99.2 F (37.3 C)] 98.6 F (37 C) (04/13 0700) Pulse Rate:  [87-117] 87 (04/13 0600) Resp:  [0-22] 0 (04/13 0600) BP: (69-110)/(47-80) 90/58 (04/13 0600) SpO2:  [93 %-99 %] 97 % (04/13 0600)  PHYSICAL EXAMINATION: Physical Examination:   VS: BP (!) 90/58   Pulse 87   Temp 98.6 F (37 C)   Resp (!) 0   Ht 5\' 5"  (1.651 m)   Wt 63.1 kg (139 lb 1.8 oz)   SpO2 97%   BMI 23.15 kg/m   General Appearance: No distress  Neuro: Limited exam, encephalopathic HEENT: PERRLA, EOM intact. Pulmonary: Coarse rhonchi appreciated with central secretions noted Cardiovascular: Tachycardia at 014, systolic murmur left parasternal  border Abdomen: Benign, Soft, non-tender. Endocrine: No evident thyromegaly. Skin:   warm, dry and intact Musculoskeletal: No edema appreciated  [name], D.O.  ICU Pager: [phone] [facility] Office Number: [phone]   11/07/2016

## 2016-11-08 LAB — CBC
HEMATOCRIT: 29.4 % — AB (ref 35.0–47.0)
HEMOGLOBIN: 10.1 g/dL — AB (ref 12.0–16.0)
MCH: 32.8 pg (ref 26.0–34.0)
MCHC: 34.2 g/dL (ref 32.0–36.0)
MCV: 95.8 fL (ref 80.0–100.0)
Platelets: 161 10*3/uL (ref 150–440)
RBC: 3.07 MIL/uL — AB (ref 3.80–5.20)
RDW: 14.6 % — ABNORMAL HIGH (ref 11.5–14.5)
WBC: 8.2 10*3/uL (ref 3.6–11.0)

## 2016-11-08 LAB — BASIC METABOLIC PANEL
ANION GAP: 6 (ref 5–15)
BUN: 22 mg/dL — ABNORMAL HIGH (ref 6–20)
CALCIUM: 8.8 mg/dL — AB (ref 8.9–10.3)
CHLORIDE: 122 mmol/L — AB (ref 101–111)
CO2: 21 mmol/L — AB (ref 22–32)
Creatinine, Ser: 0.9 mg/dL (ref 0.44–1.00)
GFR calc non Af Amer: 58 mL/min — ABNORMAL LOW (ref >60)
GLUCOSE: 109 mg/dL — AB (ref 65–99)
POTASSIUM: 3.7 mmol/L (ref 3.5–5.1)
Sodium: 149 mmol/L — ABNORMAL HIGH (ref 135–145)

## 2016-11-08 LAB — HEMOGLOBIN A1C
Hgb A1c MFr Bld: 10.2 % — ABNORMAL HIGH (ref 4.8–5.6)
Mean Plasma Glucose: 246 mg/dL

## 2016-11-08 LAB — GLUCOSE, CAPILLARY
GLUCOSE-CAPILLARY: 99 mg/dL (ref 65–99)
GLUCOSE-CAPILLARY: 108 mg/dL — AB (ref 65–99)
GLUCOSE-CAPILLARY: 151 mg/dL — AB (ref 65–99)
GLUCOSE-CAPILLARY: 191 mg/dL — AB (ref 65–99)
GLUCOSE-CAPILLARY: 151 mg/dL — AB (ref 65–99)
Glucose-Capillary: 154 mg/dL — ABNORMAL HIGH (ref 65–99)
Glucose-Capillary: 168 mg/dL — ABNORMAL HIGH (ref 65–99)

## 2016-11-08 LAB — AMMONIA: AMMONIA: 23 umol/L (ref 9–35)

## 2016-11-08 LAB — PROCALCITONIN: Procalcitonin: 1.27 ng/mL

## 2016-11-08 MED ORDER — VANCOMYCIN HCL IN DEXTROSE 750-5 MG/150ML-% IV SOLN
750.0000 mg | INTRAVENOUS | Status: DC
  Administered 2016-11-08 – 2016-11-14 (×8): 750 mg via INTRAVENOUS
  Filled 2016-11-08 (×9): qty 150

## 2016-11-08 NOTE — Plan of Care (Signed)
Problem: Safety: Goal: Ability to remain free from injury will improve Outcome: Progressing Pt requiring safety mitts, pulling at medical devices. Bed alarm in place, pt has been moving sideways in the bed with her legs drawn up. Continue to monitor for safety.   Problem: Fluid Volume: Goal: Ability to maintain a balanced intake and output will improve Outcome: Progressing Foley patent and intact, draining yellow urine. On continuous maintenance fluid with D5 since pt is NPO and unable to take po at this time. No s/s dehydration.

## 2016-11-08 NOTE — Progress Notes (Addendum)
  [facility] Progess Note   ASSESSMENT/PLAN  DKA. Patient's anion gap has closed to 6, serum blood sugar is 109. Personally on insulin with sliding scale coverage.  Hypernatremia. With hyperchloremia, secondary to saline resuscitation, on D5 half-normal with slow normalization of serum sodium  Renal failure. Resolved with BUN of 22 over creatinine 0.9  Septicemia. Positive blood cultures for MRSA. Presently on vancomycin  Mildly elevated troponin. Patient with prior history of coronary artery disease, mild troponin elevation may reflect supply demand ischemia, renal insufficiency.  Protein - calorie malnutrition. Hypoalbuminemia at 3.3  Encephalopathy. CT of head was negative for acute intracranial abnormality. Patient's mental status is slowly improving she was able to communicate this morning  Stable for floor transfer. Patient is now a DO NOT RESUSCITATE/DO NOT INTUBATE per chart. Will sign off please reconsult if can be of any assistance.    Name: [name] [name] MRN: [id number] DOB: 1932-06-04    ADMISSION DATE:  11/06/2016   SUBJECTIVE:   Patient remains encephalopathic/confused. Wakes up with minimal stimulation however not appropriate response   VITAL SIGNS: Temp:  [97.3 F (36.3 C)-98.7 F (37.1 C)] 98 F (36.7 C) (04/14 0200) Pulse Rate:  [82-104] 102 (04/14 0800) Resp:  [12-23] 18 (04/14 0800) BP: (85-119)/(46-75) 114/60 (04/14 0800) SpO2:  [93 %-98 %] 95 % (04/14 0800)  PHYSICAL EXAMINATION: Physical Examination:   VS: BP 114/60   Pulse (!) 102   Temp 98 F (36.7 C) (Axillary)   Resp 18   Ht 5\' 5"  (1.651 m)   Wt 63.1 kg (139 lb 1.8 oz)   SpO2 95%   BMI 23.15 kg/m   General Appearance: No distress  Neuro: Limited exam, encephalopathic HEENT: PERRLA, EOM intact. Pulmonary: Coarse rhonchi appreciated with central secretions noted Cardiovascular: Tachycardia at 188, systolic murmur left parasternal border Abdomen: Benign,  Soft, non-tender. Endocrine: No evident thyromegaly. Skin:   warm, dry and intact Musculoskeletal: No edema appreciated  [name], D.O.  ICU Pager: [phone] [facility] Office Number: [phone]   11/08/2016

## 2016-11-08 NOTE — Progress Notes (Signed)
Pharmacy Antibiotic Note  [name] [name] is a 83 y.o. female admitted on 11/06/2016 with bacteremia. Pharmacy has been consulted for vancomycin dosing.  Bcx: 3 of 4 bottles= Coag neg.Staph Species,  ( BCID showed Methicillin resistance.)  Plan: Day 3- will transition to vancomycin 750mg  IV Q18hr with improved renal fxn (for goal trough of 15-20). Will obtain trough prior to dose on 4/16. WIll continue to monitor renal function every 48 hours while patient is on vancomycin.   Will recheck blood cultures.    Height: 5\' 5"  (165.1 cm) Weight: 139 lb 1.8 oz (63.1 kg) IBW/kg (Calculated) : 57  Temp (24hrs), Avg:97.9 F (36.6 C), Min:97.3 F (36.3 C), Max:98.5 F (36.9 C)   Recent Labs Lab 11/06/16 0008 11/06/16 0402 11/06/16 0618  11/06/16 1619 11/06/16 2034 11/07/16 0029 11/07/16 0401 11/07/16 0744 11/08/16 0645  WBC 15.9* 18.3*  --   --   --   --   --   --  14.2* 8.2  CREATININE 2.86* 2.85*  --   < > 1.82* 1.78* 1.58* 1.43*  --  0.90  LATICACIDVEN 7.2* 5.8* 4.8*  --  2.9*  --   --   --   --   --   < > = values in this interval not displayed.  Estimated Creatinine Clearance: 43.4 mL/min (by C-G formula based on SCr of 0.9 mg/dL).    Allergies  Allergen Reactions  . Penicillins Rash    Has patient had a PCN reaction causing immediate rash, facial/tongue/throat swelling, SOB or lightheadedness with hypotension: No Has patient had a PCN reaction causing severe rash involving mucus membranes or skin necrosis: No Has patient had a PCN reaction that required hospitalization No Has patient had a PCN reaction occurring within the last 10 years: No If all of the above answers are "NO", then may proceed with Cephalosporin use.     Antimicrobials this admission: Clindamycin 4/12 >> 4/13 Vancomycin 4/13 >>   Dose adjustments this admission: N/A  Microbiology results: 4/12 BCx: gram positive cocci 3/4; MRSS on BCID  4/12 UCx: negative  4/12 MRSA PCR: negative  Pharmacy  will continue to monitor and adjust per consult.   Deaaron Fulghum A 11/08/2016 2:02 PM

## 2016-11-08 NOTE — Progress Notes (Signed)
[facility] [place] NAME: [name] [name]    MR#:  [id number]  DATE OF BIRTH:  06/06/1932  SUBJECTIVE:  CHIEF COMPLAINT:   Chief Complaint  Patient presents with  . Hyperglycemia  . Altered Mental Status   Confused. Off insulin drip. DKA resolved Afebrile Sodium level remains high at 149, now on D5 water solution, Procalcitonin level has decreased, blood cultures are positive for MRSA, now on vancomycin. Repeated blood culture is negative so far. Patient is not able to provide review of systems due to significant somnolence REVIEW OF SYSTEMS:    Review of Systems  Unable to perform ROS: Mental status change    DRUG ALLERGIES:   Allergies  Allergen Reactions  . Penicillins Rash    Has patient had a PCN reaction causing immediate rash, facial/tongue/throat swelling, SOB or lightheadedness with hypotension: No Has patient had a PCN reaction causing severe rash involving mucus membranes or skin necrosis: No Has patient had a PCN reaction that required hospitalization No Has patient had a PCN reaction occurring within the last 10 years: No If all of the above answers are "NO", then may proceed with Cephalosporin use.     VITALS:  Blood pressure 124/64, pulse 85, temperature 98.5 F (36.9 C), temperature source Oral, resp. rate 18, height 5\' 5"  (1.651 m), weight 63.1 kg (139 lb 1.8 oz), SpO2 96 %.  PHYSICAL EXAMINATION:   Physical Exam  GENERAL:  83 y.o.-year-old patient lying in the bed. [place] , Able to open her eyes and answer simple questions yes and no EYES: Pupils equal, round, reactive to light and accommodation. No scleral icterus. Extraocular muscles intact.  HEENT: Head atraumatic, normocephalic. Oropharynx and nasopharynx clear. Dry oral mucosa NECK:  Supple, no jugular venous distention. No thyroid enlargement, no tenderness.  LUNGS: Normal breath sounds bilaterally, no wheezing, rales, rhonchi. No use of accessory  muscles of respiration.  CARDIOVASCULAR: S1, S2 normal. No murmurs, rubs, or gallops.  ABDOMEN: Soft, nontender, nondistended. Bowel sounds present. No organomegaly or mass.  EXTREMITIES: No cyanosis, clubbing or edema b/l.    NEUROLOGIC: Moves all 4 extremities SKIN: No obvious rash, lesion, or ulcer.   LABORATORY PANEL:   CBC  Recent Labs Lab 11/08/16 0645  WBC 8.2  HGB 10.1*  HCT 29.4*  PLT 161   ------------------------------------------------------------------------------------------------------------------ Chemistries   Recent Labs Lab 11/06/16 0008  11/06/16 1619  11/08/16 0645  NA 132*  < > 149*  < > 149*  K 6.4*  < > 3.6  < > 3.7  CL 93*  < > 121*  < > 122*  CO2 <7*  < > 20*  < > 21*  GLUCOSE 1,148*  < > 143*  < > 109*  BUN 49*  < > 41*  < > 22*  CREATININE 2.86*  < > 1.82*  < > 0.90  CALCIUM 9.2  < > 8.8*  < > 8.8*  MG 2.2  --  1.6*  --   --   AST 32  --   --   --   --   ALT 25  --   --   --   --   ALKPHOS 112  --   --   --   --   BILITOT 1.9*  --   --   --   --   < > = values in this interval not displayed. ------------------------------------------------------------------------------------------------------------------  Cardiac Enzymes  Recent Labs Lab 11/06/16 [place]  0.21*   ------------------------------------------------------------------------------------------------------------------  RADIOLOGY:  Ct Head Wo Contrast  Result Date: 11/07/2016 CLINICAL DATA:  Altered mental status EXAM: CT HEAD WITHOUT CONTRAST TECHNIQUE: Contiguous axial images were obtained from the base of the skull through the vertex without intravenous contrast. COMPARISON:  07/15/2016 FINDINGS: Brain: Diffuse atrophic and chronic white matter ischemic changes are noted. The overall appearance is stable from the prior exam. No acute hemorrhage, acute infarction or space-occupying mass lesion is noted. Vascular: No hyperdense vessel or unexpected calcification. Skull:  Normal. Negative for fracture or focal lesion. Sinuses/Orbits: No acute finding. Other: None. IMPRESSION: Chronic atrophic and white matter ischemic changes. Electronically Signed   By: Inez Catalina M.D.   On: 11/07/2016 14:47   ASSESSMENT AND PLAN:   * DKA, Resolved, now on insulin Lantus and sliding scale short-acting insulin, blood glucose is ranging between 99-150, continue watching blood glucose levels since patient is on D5 half normal saline solution due to hypernatremia * Acute encephalopathy, follow closely, supportive therapy, IV fluids, speech therapist evaluation, continue Protonix intravenously twice a day, GI consultation is pending, likely endoscopy today/on Monday, no active bleeding was noted, hemoglobin level remains stable * Upper GI bleed, no more bleeding noted, hemoglobin level is stable * AKI, resolved with IV fluids, follow closely * Methicillin resistant Coagulase negative staph bacteremia. Continue vancomycin. Elevated pro calcitonin. Source remains unclear, repeated blood cultures are negative so far, getting echocardiogram * Acute blood loss anemia, remains stable, follow closely and transfuse as needed  All the records are reviewed and case discussed with Care Management/Social Workerr. Management plans discussed with the patient, family and they are in agreement.  CODE STATUS: DNR/DNI  DVT Prophylaxis: SCDs  TOTAL TIME TAKING CARE OF THIS PATIENT: 35 minutes.   Theodoro Grist M.D on 11/08/2016 at 12:26 PM  Between 7am to 6pm - Pager - [phone]  After 6pm go to [email or web address] - password [email or web address] [facility]  Office  [phone]  CC: Primary care physician; [name], DO  Note: This dictation was prepared with Dragon dictation along with smaller phrase technology. Any transcriptional errors that result from this process are unintentional.

## 2016-11-09 ENCOUNTER — Inpatient Hospital Stay: Admit: 2016-11-09 | Payer: Medicare Other

## 2016-11-09 LAB — GLUCOSE, CAPILLARY
GLUCOSE-CAPILLARY: 145 mg/dL — AB (ref 65–99)
GLUCOSE-CAPILLARY: 137 mg/dL — AB (ref 65–99)
Glucose-Capillary: 94 mg/dL (ref 65–99)
Glucose-Capillary: 124 mg/dL — ABNORMAL HIGH (ref 65–99)
Glucose-Capillary: 298 mg/dL — ABNORMAL HIGH (ref 65–99)

## 2016-11-09 LAB — PHOSPHORUS: PHOSPHORUS: 2.1 mg/dL — AB (ref 2.5–4.6)

## 2016-11-09 LAB — CULTURE, BLOOD (ROUTINE X 2)
SPECIAL REQUESTS: ADEQUATE
Special Requests: ADEQUATE

## 2016-11-09 LAB — BASIC METABOLIC PANEL
Anion gap: 5 (ref 5–15)
BUN: 15 mg/dL (ref 6–20)
CALCIUM: 8.8 mg/dL — AB (ref 8.9–10.3)
CO2: 25 mmol/L (ref 22–32)
CREATININE: 0.82 mg/dL (ref 0.44–1.00)
Chloride: 116 mmol/L — ABNORMAL HIGH (ref 101–111)
GFR calc non Af Amer: >60 mL/min (ref >60)
Glucose, Bld: 97 mg/dL (ref 65–99)
Potassium: 3.5 mmol/L (ref 3.5–5.1)
Sodium: 146 mmol/L — ABNORMAL HIGH (ref 135–145)

## 2016-11-09 LAB — MAGNESIUM: Magnesium: 1.7 mg/dL (ref 1.7–2.4)

## 2016-11-09 LAB — HEMOGLOBIN: Hemoglobin: 10.3 g/dL — ABNORMAL LOW (ref 12.0–16.0)

## 2016-11-09 MED ORDER — POTASSIUM CL IN DEXTROSE 5% 20 MEQ/L IV SOLN
20.0000 meq | INTRAVENOUS | Status: DC
  Administered 2016-11-09: 20 meq via INTRAVENOUS
  Filled 2016-11-09 (×3): qty 1000

## 2016-11-09 MED ORDER — MAGNESIUM SULFATE 2 GM/50ML IV SOLN
2.0000 g | Freq: Once | INTRAVENOUS | Status: AC
  Administered 2016-11-09: 10:00:00 2 g via INTRAVENOUS
  Filled 2016-11-09: qty 50

## 2016-11-09 MED ORDER — POTASSIUM PHOSPHATES 15 MMOLE/5ML IV SOLN
15.0000 mmol | Freq: Once | INTRAVENOUS | Status: AC
  Administered 2016-11-09: 15 mmol via INTRAVENOUS
  Filled 2016-11-09: qty 5

## 2016-11-09 NOTE — Progress Notes (Signed)
MEDICATION RELATED CONSULT NOTE - INITIAL   Pharmacy Consult for Electrolyte management Indication: hypomagnesemia, hypophosphatemia  Allergies  Allergen Reactions  . Penicillins Rash    Has patient had a PCN reaction causing immediate rash, facial/tongue/throat swelling, SOB or lightheadedness with hypotension: No Has patient had a PCN reaction causing severe rash involving mucus membranes or skin necrosis: No Has patient had a PCN reaction that required hospitalization No Has patient had a PCN reaction occurring within the last 10 years: No If all of the above answers are "NO", then may proceed with Cephalosporin use.     Patient Measurements: Height: 5\' 5"  (165.1 cm) Weight: 139 lb 1.8 oz (63.1 kg) IBW/kg (Calculated) : 57 Adjusted Body Weight:    Vital Signs: Temp: 96.9 F (36.1 C) (04/15 0355) Temp Source: Axillary (04/15 0355) BP: 114/69 (04/15 0355) Pulse Rate: 89 (04/15 0355) Intake/Output from previous day: 04/14 0701 - 04/15 0700 In: 2653.1 [I.V.:1103.1; IV Piggyback:150] Out: -  Intake/Output from this shift: No intake/output data recorded.  Labs:  Recent Labs  11/06/16 1619  11/07/16 0401 11/07/16 0744 11/08/16 0645 11/09/16 0412  WBC  --   --   --  14.2* 8.2  --   HGB  --   --   --  9.9* 10.1*  --   HCT  --   --   --  29.4* 29.4*  --   PLT  --   --   --  169 161  --   CREATININE 1.82*  < > 1.43*  --  0.90 0.82  MG 1.6*  --   --   --   --  1.7  PHOS 1.6*  --   --   --   --  2.1*  ALBUMIN 3.3*  --   --   --   --   --   < > = values in this interval not displayed.  Lab Results  Component Value Date   K 3.5 11/09/2016   Estimated Creatinine Clearance: 47.6 mL/min (by C-G formula based on SCr of 0.82 mg/dL).   Medical History: Past Medical History:  Diagnosis Date  . Anxiety state, unspecified   . CAD (coronary artery disease)   . Cancer [facility])    2004 Right breast, found on mammogram, radiation therapy, Dr. Bryson Ha, uterine cancer,   . COPD  (chronic obstructive pulmonary disease) ([place])   . Diabetes mellitus   . Esophageal reflux   . Heart burn   . Hyperlipidemia   . IBS (irritable bowel syndrome)   . Myocardial infarction [facility])    Cath negative except for 40% occlusion LAD.  Pt not candidate for betablocker or ACEI because of hypotension  . OSA (obstructive sleep apnea)   . Osteoporosis 02/21/09   DEXA scan showed osteoporosis with left femur T-score -2.8.  Marland Kitchen Presbyacusis   . Stroke ([place])   . Thyroid disease    Hypothyroid  . Vitamin D deficiency     Medications:  Scheduled:  . heparin  5,000 Units Subcutaneous Q8H  . insulin aspart  0-15 Units Subcutaneous Q4H  . insulin glargine  22 Units Subcutaneous QHS  . mouth rinse  15 mL Mouth Rinse BID  . pantoprazole (PROTONIX) IV  40 mg Intravenous Q12H  . vancomycin  750 mg Intravenous Q18H   Infusions:  . dextrose 5 % with KCl 20 mEq / L      Assessment: 83 yo F admitted with DKA, upper GI bleed, AKI.  Plan:  4/15: K  3.5, Mag 1.7, Phos 2.1, Na 146   Scr 0.82 Patient is on D5W w/ 20 meq KCL at 50 ml/hr. Will order Magnesium 2 gram IV x1. Will order Potassium Phosphate 15 mmol IV x 1. Will f/u with am labs.   [name] [name] A 11/09/2016,7:18 AM

## 2016-11-09 NOTE — Progress Notes (Addendum)
[place] [facility] [place] NAME: [name] [name]    MR#:  [id number]  DATE OF BIRTH:  1932-04-22  SUBJECTIVE:  CHIEF COMPLAINT:   Chief Complaint  Patient presents with  . Hyperglycemia  . Altered Mental Status   She feels comfortable today, denies any pain, remains somewhat somnolent, not able to review systems REVIEW OF SYSTEMS:    Review of Systems  Unable to perform ROS: Mental status change    DRUG ALLERGIES:   Allergies  Allergen Reactions  . Penicillins Rash    Has patient had a PCN reaction causing immediate rash, facial/tongue/throat swelling, SOB or lightheadedness with hypotension: No Has patient had a PCN reaction causing severe rash involving mucus membranes or skin necrosis: No Has patient had a PCN reaction that required hospitalization No Has patient had a PCN reaction occurring within the last 10 years: No If all of the above answers are "NO", then may proceed with Cephalosporin use.     VITALS:  Blood pressure (!) 113/56, pulse 75, temperature 98.6 F (37 C), temperature source Oral, resp. rate 18, height 5\' 5"  (1.651 m), weight 63.1 kg (139 lb 1.8 oz), SpO2 95 %.  PHYSICAL EXAMINATION:   Physical Exam  GENERAL:  83 y.o.-year-old patient lying in the bed. Remains drowsy , Able to open her eyes and answer simple questions yes and no, seems to be more interactive today EYES: Pupils equal, round, reactive to light and accommodation. No scleral icterus. Extraocular muscles intact.  HEENT: Head atraumatic, normocephalic. Oropharynx and nasopharynx clear. Dry oral mucosa NECK:  Supple, no jugular venous distention. No thyroid enlargement, no tenderness.  LUNGS: Normal breath sounds bilaterally, no wheezing, rales, rhonchi. No use of accessory muscles of respiration.  CARDIOVASCULAR: S1, S2 normal. No murmurs, rubs, or gallops.  ABDOMEN: Soft, nontender, nondistended. Bowel sounds present. No organomegaly or mass.   EXTREMITIES: No cyanosis, clubbing or edema b/l.    NEUROLOGIC: Moves all 4 extremities SKIN: No obvious rash, lesion, or ulcer.   LABORATORY PANEL:   CBC  Recent Labs Lab 11/08/16 0645 11/09/16 0730  WBC 8.2  --   HGB 10.1* 10.3*  HCT 29.4*  --   PLT 161  --    ------------------------------------------------------------------------------------------------------------------ Chemistries   Recent Labs Lab 11/06/16 0008  11/09/16 0412  NA 132*  < > 146*  K 6.4*  < > 3.5  CL 93*  < > 116*  CO2 <7*  < > 25  GLUCOSE 1,148*  < > 97  BUN 49*  < > 15  CREATININE 2.86*  < > 0.82  CALCIUM 9.2  < > 8.8*  MG 2.2  < > 1.7  AST 32  --   --   ALT 25  --   --   ALKPHOS 112  --   --   BILITOT 1.9*  --   --   < > = values in this interval not displayed. ------------------------------------------------------------------------------------------------------------------  Cardiac Enzymes  Recent Labs Lab 11/06/16 0817  TROPONINI 0.21*   ------------------------------------------------------------------------------------------------------------------  RADIOLOGY:  Ct Head Wo Contrast  Result Date: 11/07/2016 CLINICAL DATA:  Altered mental status EXAM: CT HEAD WITHOUT CONTRAST TECHNIQUE: Contiguous axial images were obtained from the base of the skull through the vertex without intravenous contrast. COMPARISON:  07/15/2016 FINDINGS: Brain: Diffuse atrophic and chronic white matter ischemic changes are noted. The overall appearance is stable from the prior exam. No acute hemorrhage, acute infarction or space-occupying mass lesion is noted. Vascular: No  hyperdense vessel or unexpected calcification. Skull: Normal. Negative for fracture or focal lesion. Sinuses/Orbits: No acute finding. Other: None. IMPRESSION: Chronic atrophic and white matter ischemic changes. Electronically Signed   By: Inez Catalina M.D.   On: 11/07/2016 14:47   ASSESSMENT AND PLAN:   * DKA, Resolved, now on insulin  Lantus and sliding scale short-acting insulin, blood glucose is ranging between 94-150, continue watching blood glucose levels since patient is on D5 solution due to hypernatremia * Acute encephalopathy, some improved, follow closely, supportive therapy, IV fluids, speech therapist evaluation, continue Protonix intravenously twice a day, GI consultation is pending,  no active bleeding was noted, hemoglobin level remains stable, possibly to initiate some dysphagia diet today, if speech therapist is agreeable. * Upper GI bleed, no more bleeding noted, hemoglobin level is stable * AKI, resolved with IV fluids, follow closely * Coagulase negative staph bacteremia. Continue vancomycin. Elevated pro calcitonin. Source remains unclear, repeated blood cultures are negative so far, getting infectious disease specialist, Dr. Ola Spurr to see patient in consultation.  * Acute blood loss anemia, remains stable, follow closely and transfuse as needed *generalized weakness, get physical therapist and also recommendations  All the records are reviewed and case discussed with Care Management/Social Workerr. Management plans discussed with the patient, family and they are in agreement.  CODE STATUS: DNR/DNI  DVT Prophylaxis: SCDs  TOTAL TIME TAKING CARE OF THIS PATIENT: 35 minutes.   Theodoro Grist M.D on 11/09/2016 at 1:28 PM  Between 7am to 6pm - Pager - [phone]  After 6pm go to [email or web address] - password [email or web address] [facility]  Office  [phone]  CC: Primary care physician; [name], DO  Note: This dictation was prepared with Dragon dictation along with smaller phrase technology. Any transcriptional errors that result from this process are unintentional.

## 2016-11-09 NOTE — Plan of Care (Signed)
Problem: Health Behavior/Discharge Planning: Goal: Ability to manage health-related needs will improve Outcome: Not Progressing Pt remains confused,   Problem: Physical Regulation: Goal: Ability to maintain clinical measurements within normal limits will improve Outcome: Progressing Pt 's blood sugars improving.

## 2016-11-10 ENCOUNTER — Inpatient Hospital Stay
Admit: 2016-11-10 | Discharge: 2016-11-10 | Disposition: A | Discharge status: Home / Self Care | Payer: Medicare Other | Attending: Internal Medicine | Admitting: Internal Medicine

## 2016-11-10 ENCOUNTER — Ambulatory Visit: Payer: Self-pay | Admitting: *Deleted

## 2016-11-10 LAB — GLUCOSE, CAPILLARY
GLUCOSE-CAPILLARY: 104 mg/dL — AB (ref 65–99)
GLUCOSE-CAPILLARY: 125 mg/dL — AB (ref 65–99)
GLUCOSE-CAPILLARY: 49 mg/dL — AB (ref 65–99)
GLUCOSE-CAPILLARY: 54 mg/dL — AB (ref 65–99)
GLUCOSE-CAPILLARY: 77 mg/dL (ref 65–99)
Glucose-Capillary: 280 mg/dL — ABNORMAL HIGH (ref 65–99)
Glucose-Capillary: 353 mg/dL — ABNORMAL HIGH (ref 65–99)
Glucose-Capillary: 48 mg/dL — ABNORMAL LOW (ref 65–99)
Glucose-Capillary: 59 mg/dL — ABNORMAL LOW (ref 65–99)
Glucose-Capillary: 91 mg/dL (ref 65–99)

## 2016-11-10 LAB — VANCOMYCIN, TROUGH: VANCOMYCIN TR: 14 ug/mL — AB (ref 15–20)

## 2016-11-10 LAB — ECHOCARDIOGRAM COMPLETE
HEIGHTINCHES: 65 in
WEIGHTICAEL: 2225.76 [oz_av]

## 2016-11-10 LAB — BASIC METABOLIC PANEL
ANION GAP: 7 (ref 5–15)
BUN: 10 mg/dL (ref 6–20)
CALCIUM: 8.3 mg/dL — AB (ref 8.9–10.3)
CO2: 24 mmol/L (ref 22–32)
Chloride: 109 mmol/L (ref 101–111)
Creatinine, Ser: 0.76 mg/dL (ref 0.44–1.00)
GFR calc Af Amer: >60 mL/min (ref >60)
GFR calc non Af Amer: >60 mL/min (ref >60)
GLUCOSE: 86 mg/dL (ref 65–99)
POTASSIUM: 3.1 mmol/L — AB (ref 3.5–5.1)
Sodium: 140 mmol/L (ref 135–145)

## 2016-11-10 LAB — PHOSPHORUS: Phosphorus: 2.1 mg/dL — ABNORMAL LOW (ref 2.5–4.6)

## 2016-11-10 LAB — MAGNESIUM: Magnesium: 1.9 mg/dL (ref 1.7–2.4)

## 2016-11-10 MED ORDER — POTASSIUM PHOSPHATES 15 MMOLE/5ML IV SOLN
15.0000 mmol | Freq: Once | INTRAVENOUS | Status: AC
  Administered 2016-11-10: 10:00:00 15 mmol via INTRAVENOUS
  Filled 2016-11-10: qty 5

## 2016-11-10 MED ORDER — INSULIN ASPART 100 UNIT/ML ~~LOC~~ SOLN
0.0000 [IU] | Freq: Three times a day (TID) | SUBCUTANEOUS | Status: DC
  Administered 2016-11-10: 15 [IU] via SUBCUTANEOUS
  Administered 2016-11-11: 18:00:00 3 [IU] via SUBCUTANEOUS
  Administered 2016-11-11: 12:00:00 11 [IU] via SUBCUTANEOUS
  Administered 2016-11-12: 18:00:00 5 [IU] via SUBCUTANEOUS
  Administered 2016-11-12: 8 [IU] via SUBCUTANEOUS
  Administered 2016-11-12: 13:00:00 15 [IU] via SUBCUTANEOUS
  Administered 2016-11-13: 12:00:00 5 [IU] via SUBCUTANEOUS
  Administered 2016-11-13: 15 [IU] via SUBCUTANEOUS
  Administered 2016-11-13: 08:00:00 11 [IU] via SUBCUTANEOUS
  Administered 2016-11-14: 8 [IU] via SUBCUTANEOUS
  Administered 2016-11-14: 12:00:00 5 [IU] via SUBCUTANEOUS
  Administered 2016-11-14: 11 [IU] via SUBCUTANEOUS
  Filled 2016-11-10: qty 5
  Filled 2016-11-10: qty 11
  Filled 2016-11-10: qty 5
  Filled 2016-11-10: qty 8
  Filled 2016-11-10: qty 5
  Filled 2016-11-10: qty 15
  Filled 2016-11-10: qty 11
  Filled 2016-11-10: qty 15
  Filled 2016-11-10: qty 3
  Filled 2016-11-10: qty 15
  Filled 2016-11-10: qty 11
  Filled 2016-11-10: qty 8

## 2016-11-10 MED ORDER — INSULIN GLARGINE 100 UNIT/ML ~~LOC~~ SOLN
11.0000 [IU] | Freq: Every day | SUBCUTANEOUS | Status: DC
  Administered 2016-11-10: 20:00:00 11 [IU] via SUBCUTANEOUS
  Filled 2016-11-10 (×2): qty 0.11

## 2016-11-10 MED ORDER — PANTOPRAZOLE SODIUM 40 MG PO PACK
40.0000 mg | PACK | Freq: Two times a day (BID) | ORAL | Status: DC
  Administered 2016-11-10 – 2016-11-14 (×8): 40 mg
  Filled 2016-11-10 (×8): qty 20

## 2016-11-10 MED ORDER — MORPHINE SULFATE (PF) 4 MG/ML IV SOLN: 1.0000 mg | INTRAVENOUS | Status: DC | PRN

## 2016-11-10 MED ORDER — ACETAMINOPHEN 325 MG PO TABS
650.0000 mg | ORAL_TABLET | Freq: Four times a day (QID) | ORAL | Status: DC | PRN
  Administered 2016-11-11 – 2016-11-14 (×2): 650 mg via ORAL
  Filled 2016-11-10 (×2): qty 2

## 2016-11-10 MED ORDER — INSULIN ASPART 100 UNIT/ML ~~LOC~~ SOLN
0.0000 [IU] | Freq: Every day | SUBCUTANEOUS | Status: DC
Start: 2016-11-10 — End: 2016-11-15
  Administered 2016-11-10 – 2016-11-11 (×2): 3 [IU] via SUBCUTANEOUS
  Administered 2016-11-12: 22:00:00 2 [IU] via SUBCUTANEOUS
  Filled 2016-11-10: qty 2
  Filled 2016-11-10 (×2): qty 3

## 2016-11-10 MED ORDER — ENOXAPARIN SODIUM 40 MG/0.4ML ~~LOC~~ SOLN
40.0000 mg | SUBCUTANEOUS | Status: DC
Start: 2016-11-10 — End: 2016-11-15
  Administered 2016-11-10 – 2016-11-13 (×4): 40 mg via SUBCUTANEOUS
  Filled 2016-11-10 (×4): qty 0.4

## 2016-11-10 NOTE — Progress Notes (Signed)
[name] [name] at [place] NAME: [name] [name]    MR#:  [id number]  DATE OF BIRTH:  13-Mar-1932  SUBJECTIVE:  CHIEF COMPLAINT:   Chief Complaint  Patient presents with  . Hyperglycemia  . Altered Mental Status   She feels comfortable today, denies any pain, Patient seemed to be a little bit more alert, able to answer a few questions yes and no.  Blood glucose levels are very low in 40s earlier today REVIEW OF SYSTEMS:    Review of Systems  Unable to perform ROS: Mental status change    DRUG ALLERGIES:   Allergies  Allergen Reactions  . Penicillins Rash    Has patient had a PCN reaction causing immediate rash, facial/tongue/throat swelling, SOB or lightheadedness with hypotension: No Has patient had a PCN reaction causing severe rash involving mucus membranes or skin necrosis: No Has patient had a PCN reaction that required hospitalization No Has patient had a PCN reaction occurring within the last 10 years: No If all of the above answers are "NO", then may proceed with Cephalosporin use.     VITALS:  Blood pressure (!) 93/51, pulse 77, temperature 98.3 F (36.8 C), resp. rate 18, height 5\' 5"  (1.651 m), weight 63.1 kg (139 lb 1.8 oz), SpO2 96 %.  PHYSICAL EXAMINATION:   Physical Exam  GENERAL:  83 y.o.-year-old patient lying in the bed. Some more alert today , able to open her eyes and answer simple questions yes and no, seems to be more interactive overall EYES: Pupils equal, round, reactive to light and accommodation. No scleral icterus. Extraocular muscles intact.  HEENT: Head atraumatic, normocephalic. Oropharynx and nasopharynx clear. Dry oral mucosa NECK:  Supple, no jugular venous distention. No thyroid enlargement, no tenderness.  LUNGS: Normal breath sounds bilaterally, no wheezing, rales, rhonchi. No use of accessory muscles of respiration.  CARDIOVASCULAR: S1, S2 normal. No murmurs, rubs, or gallops.  ABDOMEN: Soft,  nontender, nondistended. Bowel sounds present. No organomegaly or mass.  EXTREMITIES: No cyanosis, clubbing or edema b/l.    NEUROLOGIC: Moves all 4 extremities SKIN: No obvious rash, lesion, or ulcer.   LABORATORY PANEL:   CBC  Recent Labs Lab 11/08/16 0645 11/09/16 0730  WBC 8.2  --   HGB 10.1* 10.3*  HCT 29.4*  --   PLT 161  --    ------------------------------------------------------------------------------------------------------------------ Chemistries   Recent Labs Lab 11/06/16 0008  11/10/16 0504  NA 132*  < > 140  K 6.4*  < > 3.1*  CL 93*  < > 109  CO2 <7*  < > 24  GLUCOSE 1,148*  < > 86  BUN 49*  < > 10  CREATININE 2.86*  < > 0.76  CALCIUM 9.2  < > 8.3*  MG 2.2  < > 1.9  AST 32  --   --   ALT 25  --   --   ALKPHOS 112  --   --   BILITOT 1.9*  --   --   < > = values in this interval not displayed. ------------------------------------------------------------------------------------------------------------------  Cardiac Enzymes  Recent Labs Lab 11/06/16 0817  TROPONINI 0.21*   ------------------------------------------------------------------------------------------------------------------  RADIOLOGY:  No results found. ASSESSMENT AND PLAN:   * DKA, Resolved, Continue lower doses of insulin Lantus and sliding scale short-acting insulin, blood glucose  was as low as 48 earlier today, continue watching blood glucose levels since patient is off D5 solution now * Acute encephalopathy, some improved, follow closely, supportive therapy,  speech therapist evaluation, now initiated on dysphagia 1 diet with thickened liquids, continue Protonix twice a day,   no active bleeding was noted, get gastroenterologist involved if recurrent bleeding, hemoglobin level remains stable, get physical therapist involved * Upper GI bleed, no more bleeding noted, hemoglobin level is stable, continue Protonix orally twice a day * AKI, resolved with IV fluids, follow closely *  Coagulase negative staph bacteremia. Continue vancomycin. Elevated pro calcitonin. Source remains unclear, repeated blood cultures are negative so far, getting infectious disease specialist, Dr. Ola [name] to see patient in consultation, awaiting for input, will likely need 2 week therapy from negative blood cultures.  * Acute blood loss anemia, remains stable, follow closely and transfuse as needed *generalized weakness, physical therapist recommends skilled nursing facility placement    All the records are reviewed and case discussed with Care Management/Social Workerr. Management plans discussed with the patient, family and they are in agreement.  CODE STATUS: DNR/DNI  DVT Prophylaxis: SCDs  TOTAL TIME TAKING CARE OF THIS PATIENT: 35 minutes.   [name] [name] M.D on 11/10/2016 at 3:17 PM  Between 7am to 6pm - Pager - [phone]  After 6pm go to [email or web address] - password [email or web address] [facility]  Office  [phone]  CC: Primary care physician; [name] Spikes, DO  Note: This dictation was prepared with Dragon dictation along with smaller phrase technology. Any transcriptional errors that result from this process are unintentional.

## 2016-11-10 NOTE — Care Management Important Message (Signed)
Important Message  Patient Details  Name: [name] [name] MRN: [id number] Date of Birth: October 25, 1931   Medicare Important Message Given:  Yes    Shelbie Ammons, RN 11/10/2016, 9:15 AM

## 2016-11-10 NOTE — Consult Note (Signed)
[facility] Infectious Disease     Reason for Consult:  Bacteremia   Referring Physician: Theodoro Grist Date of Admission:  11/06/2016   Principal Problem:   Acidosis, metabolic Active Problems:   Altered mental status   Protein-calorie malnutrition, severe   HPI: [name] [name] is a 83 y.o. female admitted with AMS. On admit found to have DKA, dehydration, acidosis. WBC  was 18, cr 2.85 and glucose 1000.   CXR neg at admit. [place] + 2/2 cns.FU bcx neg 4/13. No fevers and wbc down to 8, cr down to 0.76 Has been on IV vanco (initially clinda as well). Per granddaughter she is back to baseline.   Past Medical History:  Diagnosis Date  . Anxiety state, unspecified   . CAD (coronary artery disease)   . Cancer [facility])    2004 Right breast, found on mammogram, radiation therapy, Dr. Bryson Ha, uterine cancer,   . COPD (chronic obstructive pulmonary disease) ([place])   . Diabetes mellitus   . Esophageal reflux   . Heart burn   . Hyperlipidemia   . IBS (irritable bowel syndrome)   . Myocardial infarction [facility])    Cath negative except for 40% occlusion LAD.  Pt not candidate for betablocker or ACEI because of hypotension  . OSA (obstructive sleep apnea)   . Osteoporosis 02/21/09   DEXA scan showed osteoporosis with left femur T-score -2.8.  Marland Kitchen Presbyacusis   . Stroke ([place])   . Thyroid disease    Hypothyroid  . Vitamin D deficiency    Past Surgical History:  Procedure Laterality Date  . ABDOMINAL HYSTERECTOMY     uterine cancer  . BREAST SURGERY    . COLON SURGERY  2013   done at [place] History  Substance Use Topics  . Smoking status: Never Smoker  . Smokeless tobacco: Never Used  . Alcohol use No   Family History  Problem Relation Age of Onset  . Diabetes Mother   . Heart attack Father     Allergies:  Allergies  Allergen Reactions  . Penicillins Rash    Has patient had a PCN reaction causing immediate rash, facial/tongue/throat swelling, SOB or  lightheadedness with hypotension: No Has patient had a PCN reaction causing severe rash involving mucus membranes or skin necrosis: No Has patient had a PCN reaction that required hospitalization No Has patient had a PCN reaction occurring within the last 10 years: No If all of the above answers are "NO", then may proceed with Cephalosporin use.     Current antibiotics: Antibiotics Given (last 72 hours)    Date/Time Action Medication Dose Rate   11/08/16 0044 Given   vancomycin (VANCOCIN) IVPB 750 mg/150 ml premix 750 mg 150 mL/hr   11/08/16 1723 Given   vancomycin (VANCOCIN) IVPB 750 mg/150 ml premix 750 mg 150 mL/hr   11/09/16 1254 Given   vancomycin (VANCOCIN) IVPB 750 mg/150 ml premix 750 mg 150 mL/hr   11/10/16 0531 Given   vancomycin (VANCOCIN) IVPB 750 mg/150 ml premix 750 mg 150 mL/hr      MEDICATIONS: . enoxaparin (LOVENOX) injection  40 mg Subcutaneous Q24H  . insulin aspart  0-15 Units Subcutaneous TID WC  . insulin aspart  0-5 Units Subcutaneous QHS  . insulin glargine  11 Units Subcutaneous QHS  . mouth rinse  15 mL Mouth Rinse BID  . pantoprazole sodium  40 mg Per Tube BID  . vancomycin  750 mg Intravenous Q18H    Review of Systems -  unable to obtain   OBJECTIVE: Temp:  [98 F (36.7 C)-98.4 F (36.9 C)] 98.3 F (36.8 C) (04/16 1149) Pulse Rate:  [77-87] 77 (04/16 1149) Resp:  [16-20] 18 (04/16 1149) BP: (93-135)/(48-57) 93/51 (04/16 1149) SpO2:  [95 %-99 %] 96 % (04/16 1149) Physical Exam  Constitutional:  Very frail, lying in bed, sleepy, but arousable  HENT: [place]/AT, PERRLA, no scleral icterus Mouth/Throat: Oropharynx is clear and dry . No oropharyngeal exudate.  Cardiovascular: Normal rate, regular rhythm and normal heart sounds.  Pulmonary/Chest: Effort normal and breath sounds normal. No respiratory distress.  has no wheezes.  Neck = supple, no nuchal rigidity  Abdominal: Soft. Bowel sounds are normal.  exhibits no distension. There is no tenderness.   Lymphadenopathy: no cervical adenopathy. No axillary adenopathy Neurological: sleepy, moves all 4  Skin: Skin is warm and dry. No rash noted. No erythema.  Psychiatric: a normal mood and affect.  behavior is normal.    LABS: Results for orders placed or performed during the hospital encounter of 11/06/16 (from the past 48 hour(s))  Glucose, capillary     Status: Abnormal   Collection Time: 11/08/16  5:16 PM  Result Value Ref Range   Glucose-Capillary 168 (H) 65 - 99 mg/dL  Glucose, capillary     Status: Abnormal   Collection Time: 11/08/16  7:44 PM  Result Value Ref Range   Glucose-Capillary 191 (H) 65 - 99 mg/dL  Glucose, capillary     Status: Abnormal   Collection Time: 11/08/16 11:24 PM  Result Value Ref Range   Glucose-Capillary 151 (H) 65 - 99 mg/dL  Glucose, capillary     Status: None   Collection Time: 11/09/16  3:52 AM  Result Value Ref Range   Glucose-Capillary 94 65 - 99 mg/dL  Basic metabolic panel     Status: Abnormal   Collection Time: 11/09/16  4:12 AM  Result Value Ref Range   Sodium 146 (H) 135 - 145 mmol/L   Potassium 3.5 3.5 - 5.1 mmol/L   Chloride 116 (H) 101 - 111 mmol/L   CO2 25 22 - 32 mmol/L   Glucose, Bld 97 65 - 99 mg/dL   BUN 15 6 - 20 mg/dL   Creatinine, Ser 0.82 0.44 - 1.00 mg/dL   Calcium 8.8 (L) 8.9 - 10.3 mg/dL   GFR calc non Af Amer >60 >60 mL/min   GFR calc Af Amer >60 >60 mL/min    Comment: (NOTE) The eGFR has been calculated using the CKD EPI equation. This calculation has not been validated in all clinical situations. eGFR's persistently <60 mL/min signify possible Chronic Kidney Disease.    Anion gap 5 5 - 15  Magnesium     Status: None   Collection Time: 11/09/16  4:12 AM  Result Value Ref Range   Magnesium 1.7 1.7 - 2.4 mg/dL  Phosphorus     Status: Abnormal   Collection Time: 11/09/16  4:12 AM  Result Value Ref Range   Phosphorus 2.1 (L) 2.5 - 4.6 mg/dL  Hemoglobin     Status: Abnormal   Collection Time: 11/09/16  7:30 AM   Result Value Ref Range   Hemoglobin 10.3 (L) 12.0 - 16.0 g/dL  Glucose, capillary     Status: Abnormal   Collection Time: 11/09/16  7:32 AM  Result Value Ref Range   Glucose-Capillary 145 (H) 65 - 99 mg/dL  Glucose, capillary     Status: Abnormal   Collection Time: 11/09/16 11:33 AM  Result Value Ref  Range   Glucose-Capillary 137 (H) 65 - 99 mg/dL  Glucose, capillary     Status: Abnormal   Collection Time: 11/09/16  4:41 PM  Result Value Ref Range   Glucose-Capillary 124 (H) 65 - 99 mg/dL  Glucose, capillary     Status: Abnormal   Collection Time: 11/09/16  8:11 PM  Result Value Ref Range   Glucose-Capillary 298 (H) 65 - 99 mg/dL  Glucose, capillary     Status: Abnormal   Collection Time: 11/10/16 12:04 AM  Result Value Ref Range   Glucose-Capillary 54 (L) 65 - 99 mg/dL  Glucose, capillary     Status: Abnormal   Collection Time: 11/10/16 12:31 AM  Result Value Ref Range   Glucose-Capillary 59 (L) 65 - 99 mg/dL  Glucose, capillary     Status: Abnormal   Collection Time: 11/10/16  1:33 AM  Result Value Ref Range   Glucose-Capillary 104 (H) 65 - 99 mg/dL  Glucose, capillary     Status: Abnormal   Collection Time: 11/10/16  4:08 AM  Result Value Ref Range   Glucose-Capillary 49 (L) 65 - 99 mg/dL  Glucose, capillary     Status: Abnormal   Collection Time: 11/10/16  4:38 AM  Result Value Ref Range   Glucose-Capillary 48 (L) 65 - 99 mg/dL  Glucose, capillary     Status: None   Collection Time: 11/10/16  5:03 AM  Result Value Ref Range   Glucose-Capillary 77 65 - 99 mg/dL  Basic metabolic panel     Status: Abnormal   Collection Time: 11/10/16  5:04 AM  Result Value Ref Range   Sodium 140 135 - 145 mmol/L   Potassium 3.1 (L) 3.5 - 5.1 mmol/L   Chloride 109 101 - 111 mmol/L   CO2 24 22 - 32 mmol/L   Glucose, Bld 86 65 - 99 mg/dL   BUN 10 6 - 20 mg/dL   Creatinine, Ser 0.76 0.44 - 1.00 mg/dL   Calcium 8.3 (L) 8.9 - 10.3 mg/dL   GFR calc non Af Amer >60 >60 mL/min   GFR  calc Af Amer >60 >60 mL/min    Comment: (NOTE) The eGFR has been calculated using the CKD EPI equation. This calculation has not been validated in all clinical situations. eGFR's persistently <60 mL/min signify possible Chronic Kidney Disease.    Anion gap 7 5 - 15  Phosphorus     Status: Abnormal   Collection Time: 11/10/16  5:04 AM  Result Value Ref Range   Phosphorus 2.1 (L) 2.5 - 4.6 mg/dL  Magnesium     Status: None   Collection Time: 11/10/16  5:04 AM  Result Value Ref Range   Magnesium 1.9 1.7 - 2.4 mg/dL  Vancomycin, trough     Status: Abnormal   Collection Time: 11/10/16  5:04 AM  Result Value Ref Range   Vancomycin Tr 14 (L) 15 - 20 ug/mL  Glucose, capillary     Status: Abnormal   Collection Time: 11/10/16  7:45 AM  Result Value Ref Range   Glucose-Capillary 125 (H) 65 - 99 mg/dL  Glucose, capillary     Status: None   Collection Time: 11/10/16 11:45 AM  Result Value Ref Range   Glucose-Capillary 91 65 - 99 mg/dL   No components found for: ESR, C REACTIVE PROTEIN MICRO: Recent Results (from the past 720 hour(s))  Urine culture     Status: None   Collection Time: 11/06/16 12:08 AM  Result Value Ref Range Status  Specimen Description URINE, CATHETERIZED  Final   Special Requests NONE  Final   Culture   Final    NO GROWTH Performed at [facility], [address] [place], [place] [address]    Report Status 11/07/2016 FINAL  Final  Blood Culture (routine x 2)     Status: Abnormal   Collection Time: 11/06/16  1:06 AM  Result Value Ref Range Status   Specimen Description BLOOD LEFT ANTECUBITAL  Final   Special Requests Blood Culture adequate volume  Final   Culture  Setup Time   Final    GRAM POSITIVE COCCI AEROBIC BOTTLE ONLY CRITICAL VALUE NOTED.  VALUE IS CONSISTENT WITH PREVIOUSLY REPORTED AND CALLED VALUE.    Culture STAPHYLOCOCCUS SPECIES (COAGULASE NEGATIVE) (A)  Final   Report Status 11/09/2016 FINAL  Final   Organism ID, Bacteria  STAPHYLOCOCCUS SPECIES (COAGULASE NEGATIVE)  Final      Susceptibility   Staphylococcus species (coagulase negative) - MIC*    CIPROFLOXACIN 4 RESISTANT Resistant     ERYTHROMYCIN >=8 RESISTANT Resistant     GENTAMICIN <=0.5 SENSITIVE Sensitive     OXACILLIN <=0.25 SENSITIVE Sensitive     TETRACYCLINE <=1 SENSITIVE Sensitive     VANCOMYCIN 1 SENSITIVE Sensitive     TRIMETH/SULFA 20 SENSITIVE Sensitive     CLINDAMYCIN RESISTANT Resistant     RIFAMPIN <=0.5 SENSITIVE Sensitive     Inducible Clindamycin POSITIVE Resistant     * STAPHYLOCOCCUS SPECIES (COAGULASE NEGATIVE)  Blood Culture (routine x 2)     Status: Abnormal   Collection Time: 11/06/16  1:06 AM  Result Value Ref Range Status   Specimen Description BLOOD RIGHT ANTECUBITAL  Final   Special Requests Blood Culture adequate volume  Final   Culture  Setup Time   Final    GRAM POSITIVE COCCI IN BOTH AEROBIC AND ANAEROBIC BOTTLES CRITICAL RESULT CALLED TO, READ BACK BY AND VERIFIED WITH: JASON ROBBINS AT 1833 ON 11/06/16 BY SNJ    Culture (A)  Final    STAPHYLOCOCCUS SPECIES (COAGULASE NEGATIVE) SUSCEPTIBILITIES PERFORMED ON PREVIOUS CULTURE WITHIN THE LAST 5 DAYS. Performed at [facility], [place] [address] [place], [place] [address]    Report Status 11/09/2016 FINAL  Final  Blood Culture ID Panel (Reflexed)     Status: Abnormal   Collection Time: 11/06/16  1:06 AM  Result Value Ref Range Status   Enterococcus species NOT DETECTED NOT DETECTED Final   Listeria monocytogenes NOT DETECTED NOT DETECTED Final   Staphylococcus species DETECTED (A) NOT DETECTED Final    Comment: Methicillin (oxacillin) resistant coagulase negative staphylococcus. Possible blood culture contaminant (unless isolated from more than one blood culture draw or clinical case suggests pathogenicity). No antibiotic treatment is indicated for blood  culture contaminants. CRITICAL RESULT CALLED TO, READ BACK BY AND VERIFIED WITH: JASON ROBBINS AT 1191 ON  11/06/16 BY SNJ    Staphylococcus aureus NOT DETECTED NOT DETECTED Final   Methicillin resistance DETECTED (A) NOT DETECTED Final    Comment: CRITICAL RESULT CALLED TO, READ BACK BY AND VERIFIED WITH: JASON ROBBINS AT 4782 ON 11/06/16 BY SNJ    Streptococcus species NOT DETECTED NOT DETECTED Final   Streptococcus agalactiae NOT DETECTED NOT DETECTED Final   Streptococcus pneumoniae NOT DETECTED NOT DETECTED Final   Streptococcus pyogenes NOT DETECTED NOT DETECTED Final   Acinetobacter baumannii NOT DETECTED NOT DETECTED Final   Enterobacteriaceae species NOT DETECTED NOT DETECTED Final   Enterobacter cloacae complex NOT DETECTED NOT DETECTED Final  Escherichia coli NOT DETECTED NOT DETECTED Final   Klebsiella oxytoca NOT DETECTED NOT DETECTED Final   Klebsiella pneumoniae NOT DETECTED NOT DETECTED Final   Proteus species NOT DETECTED NOT DETECTED Final   Serratia marcescens NOT DETECTED NOT DETECTED Final   Haemophilus influenzae NOT DETECTED NOT DETECTED Final   Neisseria meningitidis NOT DETECTED NOT DETECTED Final   Pseudomonas aeruginosa NOT DETECTED NOT DETECTED Final   Candida albicans NOT DETECTED NOT DETECTED Final   Candida glabrata NOT DETECTED NOT DETECTED Final   Candida krusei NOT DETECTED NOT DETECTED Final   Candida parapsilosis NOT DETECTED NOT DETECTED Final   Candida tropicalis NOT DETECTED NOT DETECTED Final  MRSA PCR Screening     Status: None   Collection Time: 11/06/16  3:28 AM  Result Value Ref Range Status   MRSA by PCR NEGATIVE NEGATIVE Final    Comment:        The GeneXpert MRSA Assay (FDA approved for NASAL specimens only), is one component of a comprehensive MRSA colonization surveillance program. It is not intended to diagnose MRSA infection nor to guide or monitor treatment for MRSA infections.   CULTURE, BLOOD (ROUTINE X 2) w Reflex to ID Panel     Status: None (Preliminary result)   Collection Time: 11/07/16  2:56 PM  Result Value Ref Range  Status   Specimen Description BLOOD RIGHT HAND  Final   Special Requests   Final    BOTTLES DRAWN AEROBIC AND ANAEROBIC Blood Culture results may not be optimal due to an excessive volume of blood received in culture bottles   Culture NO GROWTH 3 DAYS  Final   Report Status PENDING  Incomplete  CULTURE, BLOOD (ROUTINE X 2) w Reflex to ID Panel     Status: None (Preliminary result)   Collection Time: 11/07/16  3:06 PM  Result Value Ref Range Status   Specimen Description BLOOD LEFT HAND  Final   Special Requests   Final    BOTTLES DRAWN AEROBIC AND ANAEROBIC Blood Culture adequate volume   Culture NO GROWTH 3 DAYS  Final   Report Status PENDING  Incomplete    IMAGING: Ct Head Wo Contrast  Result Date: 11/07/2016 CLINICAL DATA:  Altered mental status EXAM: CT HEAD WITHOUT CONTRAST TECHNIQUE: Contiguous axial images were obtained from the base of the skull through the vertex without intravenous contrast. COMPARISON:  07/15/2016 FINDINGS: Brain: Diffuse atrophic and chronic white matter ischemic changes are noted. The overall appearance is stable from the prior exam. No acute hemorrhage, acute infarction or space-occupying mass lesion is noted. Vascular: No hyperdense vessel or unexpected calcification. Skull: Normal. Negative for fracture or focal lesion. Sinuses/Orbits: No acute finding. Other: None. IMPRESSION: Chronic atrophic and white matter ischemic changes. Electronically Signed   By: Inez Catalina M.D.   On: 11/07/2016 14:47   Dg Chest Port 1 View  Result Date: 11/06/2016 CLINICAL DATA:  Sepsis and hyperglycemia EXAM: PORTABLE CHEST 1 VIEW COMPARISON:  None. FINDINGS: The heart size and mediastinal contours are within normal limits. There is calcific aortic atherosclerosis. Both lungs are clear. The visualized skeletal structures are unremarkable. IMPRESSION: No active disease.  Aortic atherosclerosis. Electronically Signed   By: Ulyses Jarred M.D.   On: 11/06/2016 01:01   Dg Abd  Portable 1v  Result Date: 11/06/2016 CLINICAL DATA:  83 year old female with a history of nasogastric tube placement EXAM: PORTABLE ABDOMEN - 1 VIEW COMPARISON:  11/06/2016 FINDINGS: Limited image of the lower chest/ upper abdomen demonstrates unchanged position  of the gastric tube with the tip terminating in the region the stomach and the side-port near the gastroesophageal junction. No significant airspace disease at the lung base. Aortic calcifications. Gas within small bowel and colon. IMPRESSION: Unchanged position of the gastric tube, with the side port near the GE junction. Aortic atherosclerosis. Electronically Signed   By: Corrie Mckusick D.O.   On: 11/06/2016 09:52   Dg Abd Portable 1v  Result Date: 11/06/2016 CLINICAL DATA:  NG tube placement. EXAM: PORTABLE ABDOMEN - 1 VIEW COMPARISON:  CT abdomen and pelvis 06/08/2015 FINDINGS: Enteric tube tip terminates over the proximal stomach with side hole projecting over the distal esophagus. Gas is present in small and large bowel in the included portion of the upper abdomen, and there is a slightly dilated small bowel loop in the left upper quadrant measuring 3.2 cm. The lower abdomen was not imaged. Aortic atherosclerosis is noted. IMPRESSION: 1. Enteric tube in the proximal stomach with side hole in the distal esophagus. 2. Slight gaseous distension of small bowel on the left upper quadrant, incompletely evaluated. 3. Aortic atherosclerosis. Electronically Signed   By: Logan Bores M.D.   On: 11/06/2016 08:40    Assessment:   [name] [name] is a 83 y.o. female admitted in DKA and found to have 2/2 BCX + CNS. She has recovere back to baseline.  No intravascular devices noted, no PPM or valves. TTE is pending. FU bcx negative. I do not think she likely has a true endovascular infection. If TTE negative would NOT do a TEE given low suspiction.  Suspect [place] may have been drawn as same time at admit.  Day 5 of vanco.   Recommendations Continue  vanco while inpatient but at DC can change to oral doxycycline 100 mg bid to complete 14 total days of abx therapy. Thank you very much for allowing me to participate in the care of this patient. Please call with questions.   Cheral Marker. Ola Spurr, MD

## 2016-11-10 NOTE — Progress Notes (Signed)
Pharmacy Antibiotic Note  [name] [name] is a 83 y.o. female admitted on 11/06/2016 with bacteremia. Pharmacy has been consulted for vancomycin dosing.  4/12 Bcx: 3 of 4 bottles= Coag neg.Staph Species,  ( BCID showed Methicillin resistance.)  Plan: Day 5- continues on Vancomycin 750mg  IV Q18hr (for goal trough of 15-20). 4/16 Vancomycin trough resulted at 30mcg/ml. Will continue with current dosing, follow SCr. Repeat blood cultures are negative.   Height: 5\' 5"  (165.1 cm) Weight: 139 lb 1.8 oz (63.1 kg) IBW/kg (Calculated) : 57  Temp (24hrs), Avg:98.3 F (36.8 C), Min:98 F (36.7 C), Max:98.6 F (37 C)   Recent Labs Lab 11/06/16 0008 11/06/16 0402 11/06/16 0618  11/06/16 1619  11/07/16 0029 11/07/16 0401 11/07/16 0744 11/08/16 0645 11/09/16 0412 11/10/16 0504  WBC 15.9* 18.3*  --   --   --   --   --   --  14.2* 8.2  --   --   CREATININE 2.86* 2.85*  --   < > 1.82*  < > 1.58* 1.43*  --  0.90 0.82 0.76  LATICACIDVEN 7.2* 5.8* 4.8*  --  2.9*  --   --   --   --   --   --   --   VANCOTROUGH  --   --   --   --   --   --   --   --   --   --   --  14*  < > = values in this interval not displayed.  Estimated Creatinine Clearance: 48.8 mL/min (by C-G formula based on SCr of 0.76 mg/dL).    Allergies  Allergen Reactions  . Penicillins Rash    Has patient had a PCN reaction causing immediate rash, facial/tongue/throat swelling, SOB or lightheadedness with hypotension: No Has patient had a PCN reaction causing severe rash involving mucus membranes or skin necrosis: No Has patient had a PCN reaction that required hospitalization No Has patient had a PCN reaction occurring within the last 10 years: No If all of the above answers are "NO", then may proceed with Cephalosporin use.     Antimicrobials this admission: Clindamycin 4/12 >> 4/13 Vancomycin 4/13 >>   Dose adjustments this admission: N/A  Microbiology results: 4/12 BCx: gram positive cocci 3/4; MRSS on BCID   4/12 UCx: negative  4/12 MRSA PCR: negative 4/13 BCx: [place] will continue to monitor and adjust per consult.   Paulina Fusi, PharmD, BCPS 11/10/2016 9:32 AM

## 2016-11-10 NOTE — Progress Notes (Signed)
MEDICATION RELATED CONSULT NOTE - INITIAL   Pharmacy Consult for Electrolyte management Indication: hypomagnesemia, hypophosphatemia  Allergies  Allergen Reactions  . Penicillins Rash    Has patient had a PCN reaction causing immediate rash, facial/tongue/throat swelling, SOB or lightheadedness with hypotension: No Has patient had a PCN reaction causing severe rash involving mucus membranes or skin necrosis: No Has patient had a PCN reaction that required hospitalization No Has patient had a PCN reaction occurring within the last 10 years: No If all of the above answers are "NO", then may proceed with Cephalosporin use.     Patient Measurements: Height: 5\' 5"  (165.1 cm) Weight: 139 lb 1.8 oz (63.1 kg) IBW/kg (Calculated) : 57 Adjusted Body Weight:    Vital Signs: Temp: 98.1 F (36.7 C) (04/16 0808) Temp Source: Oral (04/16 0808) BP: 105/51 (04/16 0808) Pulse Rate: 80 (04/16 0808) Intake/Output from previous day: 04/15 0701 - 04/16 0700 In: 1338.3 [P.O.:60; I.V.:873.3; IV Piggyback:405] Out: -  Intake/Output from this shift: No intake/output data recorded.  Labs:  Recent Labs  11/08/16 0645 11/09/16 0412 11/09/16 0730 11/10/16 0504  WBC 8.2  --   --   --   HGB 10.1*  --  10.3*  --   HCT 29.4*  --   --   --   PLT 161  --   --   --   CREATININE 0.90 0.82  --  0.76  MG  --  1.7  --  1.9  PHOS  --  2.1*  --  2.1*    Lab Results  Component Value Date   K 3.1 (L) 11/10/2016   Estimated Creatinine Clearance: 48.8 mL/min (by C-G formula based on SCr of 0.76 mg/dL).   Medical History: Past Medical History:  Diagnosis Date  . Anxiety state, unspecified   . CAD (coronary artery disease)   . Cancer [facility])    2004 Right breast, found on mammogram, radiation therapy, Dr. Bryson Ha, uterine cancer,   . COPD (chronic obstructive pulmonary disease) ([place])   . Diabetes mellitus   . Esophageal reflux   . Heart burn   . Hyperlipidemia   . IBS (irritable bowel syndrome)    . Myocardial infarction [facility])    Cath negative except for 40% occlusion LAD.  Pt not candidate for betablocker or ACEI because of hypotension  . OSA (obstructive sleep apnea)   . Osteoporosis 02/21/09   DEXA scan showed osteoporosis with left femur T-score -2.8.  Marland Kitchen Presbyacusis   . Stroke ([place])   . Thyroid disease    Hypothyroid  . Vitamin D deficiency     Medications:  Scheduled:  . heparin  5,000 Units Subcutaneous Q8H  . insulin aspart  0-15 Units Subcutaneous Q4H  . insulin glargine  22 Units Subcutaneous QHS  . mouth rinse  15 mL Mouth Rinse BID  . pantoprazole (PROTONIX) IV  40 mg Intravenous Q12H  . potassium phosphate IVPB (mmol)  15 mmol Intravenous Once  . vancomycin  750 mg Intravenous Q18H   Infusions:  . dextrose 5 % with KCl 20 mEq / L 20 mEq (11/09/16 1729)    Assessment: 83 yo F admitted with DKA, upper GI bleed, AKI.  Plan:  4/15: K 3.1, Mag 1.9, Phos 2.1, Scr 0.76 Patient is on D5W w/ 20 meq KCL at 50 ml/hr. Will order Potassium Phosphate 15 mmol IV x 1. Will f/u with am labs.  Paulina Fusi, PharmD, BCPS 11/10/2016 9:28 AM

## 2016-11-10 NOTE — Progress Notes (Signed)
SLP Cancellation Note  Patient Details Name: TANISE RUSSMAN MRN: [id number] DOB: 06/07/1932   Cancelled treatment:       Reason Eval/Treat Not Completed: Other (comment) (Pt declined stating "I'm tired, I just want to sleep") Pt refused BSE stating she was "tired, I just want to sleep right now". Consulted NSG who did not report any immediate concerns. Pt did report she ate lunch this afternoon with no difficulty swallowing  (getting choked or things "going down the wrong way"). Reviewed chart. Skilled ST services to f/u tomorrow.    Eulogio Ditch, B.S Graduate Clinician  11/10/2016, 2:26 PM

## 2016-11-10 NOTE — Evaluation (Signed)
Physical Therapy Evaluation Patient Details Name: [name] [name] MRN: [id number] DOB: 22-May-1932 Today's Date: 11/10/2016   History of Present Illness  Pt admitted for hyperglycemia and AMS. Pt with history of CAD, COPD, DM, IBS, and CVA. Pt now diagnosed with metabolic acidosis. Pt slightly disoriented, however then able to state name, place, year.  Clinical Impression  Pt is a pleasant 83 year old female who was admitted for hyperglycemia and AMS. Pt performs bed mobility with min assist, transfers with mod assist, and ambulation with min assist and RW. Pt demonstrates deficits with balance/mobility/strength/endurance. Pt does not appear to be at baseline at this time. Would benefit from skilled PT to address above deficits and promote optimal return to PLOF; recommend transition to STR upon discharge from acute hospitalization.       Follow Up Recommendations SNF    Equipment Recommendations  None recommended by PT    Recommendations for Other Services       Precautions / Restrictions Precautions Precautions: Fall Restrictions Weight Bearing Restrictions: No      Mobility  Bed Mobility Overal bed mobility: Needs Assistance Bed Mobility: Supine to Sit     Supine to sit: Min assist     General bed mobility comments: needs slight assist for trunk mobility and then assist for scooting out towards EOB. Once seated at EOB, able to sit with upright posture.  Transfers Overall transfer level: Needs assistance Equipment used: Rolling walker (2 wheeled) Transfers: Sit to/from Stand Sit to Stand: Mod assist         General transfer comment: assist for standing from elevated bed. Once standing, needs cues for upright posture and to straighten knees.  Ambulation/Gait Ambulation/Gait assistance: Min assist Ambulation Distance (Feet): 3 Feet Assistive device: Rolling walker (2 wheeled) Gait Pattern/deviations: Step-to pattern     General Gait Details: short step to  gait pattern performed as pt fatigues quickly. Initially was going to walk further, however pt fatigues quickly.   Stairs            Wheelchair Mobility    Modified Rankin (Stroke Patients Only)       Balance Overall balance assessment: Needs assistance Sitting-balance support: Feet supported Sitting balance-Leahy Scale: Good     Standing balance support: Bilateral upper extremity supported Standing balance-Leahy Scale: Fair                               Pertinent Vitals/Pain Pain Assessment: No/denies pain    Home Living Family/patient expects to be discharged to:: Private residence Living Arrangements: Children (Per report, pt lives with son) Available Help at Discharge: Family;Available PRN/intermittently Type of Home: House Home Access: Stairs to enter Entrance Stairs-Rails: Psychiatric nurse of Steps: 3 Home Layout: One level Home Equipment: Walker - 2 wheels;Shower seat;Cane - single point      Prior Function Level of Independence: Independent with assistive device(s)         Comments: has been using RW recently, reports her and son take care of each other     Hand Dominance        Extremity/Trunk Assessment   Upper Extremity Assessment Upper Extremity Assessment: Generalized weakness (B UE grossly 4/5)    Lower Extremity Assessment Lower Extremity Assessment: Generalized weakness (B LE grossly 4/5)       Communication   Communication: No difficulties  Cognition Arousal/Alertness: Awake/alert Behavior During Therapy: WFL for tasks assessed/performed Overall Cognitive Status: Within Functional Limits  for tasks assessed                                        General Comments      Exercises Other Exercises Other Exercises: Seated ther-ex performed in recliner including hip abd/add, SLRs, and LAQ. All ther-ex performed with min assist and cues for sequencing.   Assessment/Plan    PT  Assessment Patient needs continued PT services  PT Problem List Decreased strength;Decreased activity tolerance;Decreased balance;Decreased mobility       PT Treatment Interventions Gait training;DME instruction;Therapeutic exercise    PT Goals (Current goals can be found in the Care Plan section)  Acute Rehab PT Goals Patient Stated Goal: to get stronger PT Goal Formulation: With patient Time For Goal Achievement: 11/24/16 Potential to Achieve Goals: Good    Frequency Min 3X/week   Barriers to discharge        Co-evaluation               End of Session Equipment Utilized During Treatment: Gait belt Activity Tolerance: Patient tolerated treatment well Patient left: in chair;with chair alarm set Nurse Communication: Mobility status PT Visit Diagnosis: Unsteadiness on feet (R26.81);Muscle weakness (generalized) (M62.81)    Time: 0076-2263 PT Time Calculation (min) (ACUTE ONLY): 26 min   Charges:   PT Evaluation $PT Eval Moderate Complexity: 1 Procedure PT Treatments $Therapeutic Exercise: 8-22 mins   PT G Codes:        Greggory Stallion, PT, DPT [phone]   Etola Mull 11/10/2016, 10:48 AM

## 2016-11-10 NOTE — Progress Notes (Addendum)
Inpatient Diabetes Program Recommendations  AACE/ADA: New Consensus Statement on Inpatient Glycemic Control (2015)  Target Ranges:  Prepandial:   less than 140 mg/dL      Peak postprandial:   less than 180 mg/dL (1-2 hours)      Critically ill patients:  140 - 180 mg/dL   Results for [name] [name], [name] [name] (MRN [id number]) as of 11/10/2016 11:31  Ref. Range 11/08/2016 23:24 11/09/2016 03:52 11/09/2016 07:32 11/09/2016 11:33 11/09/2016 16:41 11/09/2016 20:11  Glucose-Capillary Latest Ref Range: 65 - 99 mg/dL 151 (H)  3 units Novolog 94  0 units Novolog 145 (H)  2 units Novolog 137 (H)  2 units Novolog 124 (H)  2 units Novolog 298 (H)  8 units Novolog +   22 units Lantus   Results for [name] [name], [name] [name] (MRN [id number]) as of 11/10/2016 11:31  Ref. Range 11/10/2016 00:04 11/10/2016 00:31 11/10/2016 01:33 11/10/2016 04:08 11/10/2016 04:38 11/10/2016 05:03 11/10/2016 07:45  Glucose-Capillary Latest Ref Range: 65 - 99 mg/dL 54 (L)  0 units Novolog 59 (L) 104 (H) 49 (L)  0 units Novolog 48 (L) 77 125 (H)  2 units Novolog    Home DM Meds: Lantus 22 units QHS       Novolog 8 units Breakfast/ 10 units Lunch/ 12 units Dinner       Novolog SSI for high CBGs       Metformin 500 mg BID  Current Insulin Orders: Lantus 22 units QHS      Novolog Moderate Correction Scale/ SSI (0-15 units) Q4 hours      MD- Note patient had Hypoglycemia this AM at midnight and again at 4am.    Question if the Lantus caused the Hypoglycemic event or the 8 units Novolog patient received at 8pm last night?  Please consider the following:  1. Reduce Lantus slightly to 20 units QHS  2. Change/ Reduce Novolog SSI to Sensitive scale (0-9 units) TID AC + HS (currently ordered 0-15 units Q4 hours)    --Will follow patient during hospitalization--  Wyn Quaker RN, MSN, CDE Diabetes Coordinator Inpatient Glycemic Control Team Team Pager: [phone] (8a-5p)

## 2016-11-10 NOTE — Progress Notes (Signed)
*  PRELIMINARY RESULTS* Echocardiogram 2D Echocardiogram has been performed.  Sherrie Sport 11/10/2016, 9:52 AM

## 2016-11-11 ENCOUNTER — Inpatient Hospital Stay: Payer: Medicare Other

## 2016-11-11 LAB — GLUCOSE, CAPILLARY
GLUCOSE-CAPILLARY: 95 mg/dL (ref 65–99)
Glucose-Capillary: 53 mg/dL — ABNORMAL LOW (ref 65–99)
Glucose-Capillary: 322 mg/dL — ABNORMAL HIGH (ref 65–99)
Glucose-Capillary: 199 mg/dL — ABNORMAL HIGH (ref 65–99)
Glucose-Capillary: 278 mg/dL — ABNORMAL HIGH (ref 65–99)

## 2016-11-11 LAB — HEMOGLOBIN: HEMOGLOBIN: 9.6 g/dL — AB (ref 12.0–16.0)

## 2016-11-11 MED ORDER — HYDROCOD POLST-CPM POLST ER 10-8 MG/5ML PO SUER
5.0000 mL | Freq: Two times a day (BID) | ORAL | Status: DC
  Administered 2016-11-11 – 2016-11-14 (×7): 5 mL via ORAL
  Filled 2016-11-11 (×7): qty 5

## 2016-11-11 MED ORDER — POTASSIUM CHLORIDE IN NACL 20-0.9 MEQ/L-% IV SOLN
INTRAVENOUS | Status: DC
  Administered 2016-11-11 – 2016-11-13 (×3): via INTRAVENOUS
  Filled 2016-11-11 (×5): qty 1000

## 2016-11-11 MED ORDER — IOPAMIDOL (ISOVUE-370) INJECTION 76%
75.0000 mL | Freq: Once | INTRAVENOUS | Status: AC | PRN
  Administered 2016-11-11: 19:00:00 75 mL via INTRAVENOUS

## 2016-11-11 MED ORDER — DEXTROSE 5 % IV SOLN
2.0000 g | INTRAVENOUS | Status: DC
  Administered 2016-11-11 – 2016-11-13 (×3): 2 g via INTRAVENOUS
  Filled 2016-11-11 (×4): qty 2

## 2016-11-11 NOTE — NC FL2 (Signed)
[place] LEVEL OF CARE SCREENING TOOL     IDENTIFICATION  Patient Name: [name] [name] Birthdate: [birth year] Sex: female Admission Date (Current Location): 11/06/2016  [place] and [place] Number:  Engineering geologist and Address:  [facility], [address] [place], [place] [address]      Provider Number: [id number]  Attending Physician Name and Address:  Theodoro Grist, MD  Relative Name and Phone Number:       Current Level of Care: Hospital Recommended Level of Care: [place] Prior Approval Number:    Date Approved/Denied:   PASRR Number: [id number]  Discharge Plan: SNF    Current Diagnoses: Patient Active Problem List   Diagnosis Date Noted  . Protein-calorie malnutrition, severe 11/07/2016  . Acidosis, metabolic 34/19/6222  . Altered mental status 11/06/2016  . Stroke ([place]) 08/20/2016  . Peripheral vascular disease ([place]) 04/09/2016  . Elderly person living alone 01/04/2016  . Long term current use of insulin ([place]) 03/08/2014  . CKD stage 3 due to type 2 diabetes mellitus ([place]) 02/22/2014  . Hypothyroidism 09/23/2012  . GERD (gastroesophageal reflux disease) 09/23/2012  . Anxiety 07/26/2012  . Hyperlipidemia with target LDL less than 70 12/03/2011  . DM (diabetes mellitus) type II uncontrolled, periph vascular disorder ([place]) 04/03/2011  . CAD, NATIVE VESSEL 12/13/2009    Orientation RESPIRATION BLADDER Height & Weight     Self, Place  Normal Continent Weight: 139 lb 1.8 oz (63.1 kg) Height:  5\' 5"  (165.1 cm)  BEHAVIORAL SYMPTOMS/MOOD NEUROLOGICAL BOWEL NUTRITION STATUS      Continent Diet (DYS 2, Thin Liquids)  AMBULATORY STATUS COMMUNICATION OF NEEDS Skin   Limited Assist Verbally Normal                       Personal Care Assistance Level of Assistance  Bathing, Feeding, Dressing Bathing Assistance: Limited assistance Feeding assistance: Independent Dressing Assistance: Limited  assistance     Functional Limitations Info  Sight, Hearing, Speech Sight Info: Adequate Hearing Info: Impaired Speech Info: Adequate    SPECIAL CARE FACTORS FREQUENCY  PT (By licensed PT), Speech therapy     PT Frequency: 5       Speech Therapy Frequency: 5      Contractures Contractures Info: Not present    Additional Factors Info  Code Status, Allergies, Insulin Sliding Scale Code Status Info: DNR Allergies Info: Penicillins   Insulin Sliding Scale Info: 4x/day       Current Medications (11/11/2016):  This is the current hospital active medication list Current Facility-Administered Medications  Medication Dose Route Frequency Provider Last Rate Last Dose  . acetaminophen (TYLENOL) tablet 650 mg  650 mg Oral Q6H PRN Theodoro Grist, MD   650 mg at 11/11/16 1006  . chlorpheniramine-HYDROcodone (TUSSIONEX) 10-8 MG/5ML suspension 5 mL  5 mL Oral Q12H Theodoro Grist, MD   5 mL at 11/11/16 1021  . enoxaparin (LOVENOX) injection 40 mg  40 mg Subcutaneous Q24H Theodoro Grist, MD   40 mg at 11/10/16 1810  . insulin aspart (novoLOG) injection 0-15 Units  0-15 Units Subcutaneous TID WC Theodoro Grist, MD   11 Units at 11/11/16 1211  . insulin aspart (novoLOG) injection 0-5 Units  0-5 Units Subcutaneous QHS Theodoro Grist, MD   3 Units at 11/10/16 2020  . MEDLINE mouth rinse  15 mL Mouth Rinse BID Bincy S Varughese, NP   15 mL at 11/10/16 0800  . morphine 4 MG/ML injection 1-2 mg  1-2 mg Intravenous Q1H PRN Theodoro Grist, MD      . ondansetron (ZOFRAN) injection 4 mg  4 mg Intravenous Q6H PRN Awilda Bill, NP   4 mg at 11/08/16 1255  . pantoprazole sodium (PROTONIX) 40 mg/20 mL oral suspension 40 mg  40 mg Per Tube BID Theodoro Grist, MD   40 mg at 11/11/16 1006  . vancomycin (VANCOCIN) IVPB 750 mg/150 ml premix  750 mg Intravenous Q18H Bincy S Varughese, NP   750 mg at 11/11/16 0030     Discharge Medications: Please see discharge summary for a list of discharge  medications.  Relevant Imaging Results:  Relevant Lab Results:   Additional Information SSN:  [id number]  Darden Dates, LCSW

## 2016-11-11 NOTE — Clinical Social Work Note (Signed)
Clinical Social Work Assessment  Patient Details  Name: [name] [name] MRN: [id number] Date of Birth: [birth year]  Date of referral:  11/11/16               Reason for consult:  Facility Placement                Permission sought to share information with:  Family Supports Permission granted to share information::  Yes, Verbal Permission Granted  Name::     Harmani Neto  Relationship::  daughter  Contact Information:  [phone]  Housing/Transportation Living arrangements for the past 2 months:  [place] of Information:  Patient, Other (Comment Required) (Granddaughters) Patient Interpreter Needed:  None Criminal Activity/Legal Involvement Pertinent to Current Situation/Hospitalization:  No - Comment as needed Significant Relationships:  Adult Children, Other Family Members Lives with:  Self Do you feel safe going back to the place where you live?  Yes Need for family participation in patient care:  Yes (Comment)  Care giving concerns:  No care giving concerns identified.   Social Worker assessment / plan:  CSW met with pt and granddaughters to address consult for New SNF. CSW introduced herself and explained role of social work CSW also explained process of discharging to SNF. Pt has been to SNF in the past. Pt is agreeable to SNF placement, however would like to return home. Granddaughters are supportive of STR. CSW initiated SNF search and will follow up with bed offers. CSW will continue to follow.   Employment status:  Retired Nurse, adult PT Recommendations:  [place] / Referral to community resources:  [place]  Patient/Family's Response to care:  Pt and granddaughters were appreciative of CSW support.   Patient/Family's Understanding of and Emotional Response to Diagnosis, Current Treatment, and Prognosis:  Pt understands that she would benefit from STR prior to returning  home.  Emotional Assessment Appearance:  Appears stated age Attitude/Demeanor/Rapport:  Other (Appropriate) Affect (typically observed):  Accepting, Pleasant Orientation:  Oriented to Self, Oriented to Place Alcohol / Substance use:  Not Applicable Psych involvement (Current and /or in the community):  No (Comment)  Discharge Needs  Concerns to be addressed:  Adjustment to Illness Readmission within the last 30 days:  No Current discharge risk:  Chronically ill Barriers to Discharge:  Continued Medical Work up   Darden Dates, LCSW 11/11/2016, 12:46 PM

## 2016-11-11 NOTE — Evaluation (Signed)
Clinical/Bedside Swallow Evaluation Patient Details  Name: [name] [name] MRN: [id number] Date of Birth: May 09, 1932  Today's Date: 11/11/2016 Time: SLP Start Time (ACUTE ONLY): 0900 SLP Stop Time (ACUTE ONLY): 1000 SLP Time Calculation (min) (ACUTE ONLY): 60 min  Past Medical History:  Past Medical History:  Diagnosis Date  . Anxiety state, unspecified   . CAD (coronary artery disease)   . Cancer [facility])    2004 Right breast, found on mammogram, radiation therapy, Dr. Bryson Ha, uterine cancer,   . COPD (chronic obstructive pulmonary disease) ([place])   . Diabetes mellitus   . Esophageal reflux   . Heart burn   . Hyperlipidemia   . IBS (irritable bowel syndrome)   . Myocardial infarction [facility])    Cath negative except for 40% occlusion LAD.  Pt not candidate for betablocker or ACEI because of hypotension  . OSA (obstructive sleep apnea)   . Osteoporosis 02/21/09   DEXA scan showed osteoporosis with left femur T-score -2.8.  Marland Kitchen Presbyacusis   . Stroke ([place])   . Thyroid disease    Hypothyroid  . Vitamin D deficiency    Past Surgical History:  Past Surgical History:  Procedure Laterality Date  . ABDOMINAL HYSTERECTOMY     uterine cancer  . BREAST SURGERY    . COLON SURGERY  2013   done at [facility]   HPI:  Pt  is a 83 y.o. female with a known history of Coronary artery disease, COPD, diabetes mellitus, uterine cancer, hyperlipidemia, irritable bowel syndrome, sleep apnea, osteoporosis, hypothyroidism, CVA presented to the emergency room with confusion and change in mental status. Patient was evaluated in the emergency room her blood sugar was very elevated. Patient lives with her daughter at home. Patient is not oriented to time place and person unable to give any history. She is dry and dehydrated. Her blood sugar in the emergency room was more than thousand milligrams per deciliter. Beta hydroxybutyrate test was pending. Patient was severely acidotic on ABG. Her CO2 was less than 7 and  creatinine was 2.86. Bedside lactate level was also elevated. Patient was given 2 L of IV normal saline bolus in the emergency room and started on IV insulin drip protocol. Case was discussed with intensivist attending on call who recommended IV insulin drip and IV fluid hydration and admission to the ICU. Not much history could be obtained from the patient. Currently, today, pt was sitting up in bed feeding self some of her breakfast meal; min time for responses during conversation. Pt followed basic commands w/ cues/support. (Addendum)  Assessment / Plan / Recommendation Clinical Impression  Pt currently appears to present at mild aspiration risk with nectar thickened liquids and softened solids. Pt seen with trials of thin liquids, nectar thickened liquids, puree and mech soft following general aspiration precautions this date. Overt s/sx of aspiration noted with thin liquid trials c/b delayed cough and delayed throat clear intermitted throughtout trials. Pt also exhibited intermitted throat clearing and coughing with trials of hot cereal (cream of wheat?) that was more loose than normal; this may have contributed to increased coughing/throat clearing this date- recommend oatmeal instead. Pt did not exhibit any overt s/sx of aspiration with trials of true dysphagia 1 (puree), softened solids or nectar thickened liquid trials. No oral phase deficits noted (adequate bolus manegment and timely swallow) Pt educated on general aspiration precuations with sign posted in room. Recommend dysphagia level 2 diet (chopped) with nectar thickened liquids (no straws) following general aspiration precautions, assist with set  up at meals as well as supervision. NSG updated. Skilled ST services to f/u for diet toleration and upgrade as indicated.  SLP Visit Diagnosis: Dysphagia, oropharyngeal phase (R13.12)    Aspiration Risk  Mild aspiration risk    Diet Recommendation   Dysphagia level 2 (chopped) with nectar  thickened liquids via cup sips (no straws) following general aspiration precautions. Assist with set up and supervision at meals.   Medication Administration: Whole meds with puree    Other  Recommendations Oral Care Recommendations: Oral care BID;Patient independent with oral care   Follow up Recommendations  (TBD)      Frequency and Duration min 3x week  2 weeks       Prognosis Prognosis for Safe Diet Advancement: Good      Swallow Study   General Date of Onset: 11/06/16 HPI: Pt  is a 83 y.o. female with a known history of Coronary artery disease, COPD, diabetes mellitus, uterine cancer, hyperlipidemia, irritable bowel syndrome, sleep apnea, osteoporosis, hypothyroidism, CVA presented to the emergency room with confusion and change in mental status. Patient was evaluated in the emergency room her blood sugar was very elevated. Patient lives with her daughter at home. Patient is not oriented to time place and person unable to give any history. She is dry and dehydrated. Her blood sugar in the emergency room was more than thousand milligrams per deciliter. Beta hydroxybutyrate test was pending. Patient was severely acidotic on ABG. Her CO2 was less than 7 and creatinine was 2.86. Bedside lactate level was also elevated. Patient was given 2 L of IV normal saline bolus in the emergency room and started on IV insulin drip protocol. Case was discussed with intensivist attending on call who recommended IV insulin drip and IV fluid hydration and admission to the ICU. Not much history could be obtained from the patient. Type of Study: Bedside Swallow Evaluation Previous Swallow Assessment: none noted Diet Prior to this Study: Dysphagia 1 (puree);Nectar-thick liquids Temperature Spikes Noted: No Respiratory Status: Room air History of Recent Intubation: No Behavior/Cognition: Alert;Cooperative;Pleasant mood Oral Cavity Assessment: Within Functional Limits Oral Care Completed by SLP: Recent  completion by staff Oral Cavity - Dentition: Adequate natural dentition Vision: Functional for self-feeding (wears glasses) Self-Feeding Abilities: Able to feed self;Needs assist;Needs set up Patient Positioning: Upright in bed Baseline Vocal Quality: Normal Volitional Cough: Strong Volitional Swallow: Able to elicit    Oral/Motor/Sensory Function Overall Oral Motor/Sensory Function: Within functional limits   Ice Chips Ice chips: Not tested   Thin Liquid Thin Liquid: Impaired Presentation: Straw;Cup Pharyngeal  Phase Impairments: Throat Clearing - Delayed;Cough - Delayed    Nectar Thick Nectar Thick Liquid: Within functional limits Presentation: Cup (X4 oz trial, delayed throat clear X1)   Honey Thick Honey Thick Liquid: Not tested   Puree Puree: Within functional limits Presentation: Spoon;Self Fed (X10+ trials, delayed cough X3 with grits)   Solid   GO   Solid: Within functional limits Presentation: Self Fed (softened solids X3 trials)        Eulogio Ditch, B.S Graduate Clinician  11/11/2016,10:12 AM   This information has been reviewed and agreed upon by this supervising clinician.  Orinda Kenner, [place], CCC-SLP

## 2016-11-11 NOTE — Clinical Social Work Placement (Signed)
   CLINICAL SOCIAL WORK PLACEMENT  NOTE  Date:  11/11/2016  Patient Details  Name: [name] [name] MRN: [id number] Date of Birth: 10/13/1931  Clinical Social Work is seeking post-discharge placement for this patient at the [place] level of care (*CSW will initial, date and re-position this form in  chart as items are completed):  Yes   Patient/family provided with [place] Work Department's list of facilities offering this level of care within the geographic area requested by the patient (or if unable, by the patient's family).  Yes   Patient/family informed of their freedom to choose among providers that offer the needed level of care, that participate in Medicare, Medicaid or managed care program needed by the patient, have an available bed and are willing to accept the patient.  Yes   Patient/family informed of [place]'s ownership interest in [facility] and [facility], as well as of the fact that they are under no obligation to receive care at these facilities.  PASRR submitted to EDS on       PASRR number received on       Existing PASRR number confirmed on 11/11/16     FL2 transmitted to all facilities in geographic area requested by pt/family on 11/11/16     FL2 transmitted to all facilities within larger geographic area on       Patient informed that his/her managed care company has contracts with or will negotiate with certain facilities, including the following:            Patient/family informed of bed offers received.  Patient chooses bed at       Physician recommends and patient chooses bed at      Patient to be transferred to   on  .  Patient to be transferred to facility by       Patient family notified on   of transfer.  Name of family member notified:        PHYSICIAN       Additional Comment:    _______________________________________________ Darden Dates, LCSW 11/11/2016, 12:49 PM

## 2016-11-11 NOTE — Progress Notes (Signed)
Physical Therapy Treatment Patient Details Name: [name] [name] MRN: [id number] DOB: 27-May-1932 Today's Date: 11/11/2016    History of Present Illness Pt admitted for hyperglycemia and AMS. Pt with history of CAD, COPD, DM, IBS, and CVA. Pt now diagnosed with metabolic acidosis. Pt slightly disoriented, however then able to state name, place, year.    PT Comments    Pt is making good progress towards goals with improved cognition this date. Pt agreeable to therapy and able to perform supine there-ex, still demonstrates significant weakness and needs assist for completion of all there-ex. Pt able to ambulate to recliner, however limited secondary to fatigue. Will continue to progress.   Follow Up Recommendations  SNF     Equipment Recommendations  None recommended by PT    Recommendations for Other Services       Precautions / Restrictions Precautions Precautions: Fall Restrictions Weight Bearing Restrictions: No    Mobility  Bed Mobility Overal bed mobility: Needs Assistance Bed Mobility: Supine to Sit     Supine to sit: Min assist     General bed mobility comments: needs assist for trunk mobility. Pt tends to lose balance towards R side, needs UE support on bed to maintain upright posture  Transfers Overall transfer level: Needs assistance Equipment used: Rolling walker (2 wheeled) Transfers: Sit to/from Stand Sit to Stand: Mod assist         General transfer comment: assist for standing from elevated bed. Once standing, needs cues for upright posture and to straighten knees.  Ambulation/Gait Ambulation/Gait assistance: Min assist Ambulation Distance (Feet): 5 Feet Assistive device: Rolling walker (2 wheeled) Gait Pattern/deviations: Step-to pattern     General Gait Details: short step to gait pattern with improved technique using RW. No LOB or buckling noted   Stairs            Wheelchair Mobility    Modified Rankin (Stroke Patients Only)        Balance                                            Cognition Arousal/Alertness: Awake/alert Behavior During Therapy: WFL for tasks assessed/performed Overall Cognitive Status: Within Functional Limits for tasks assessed                                        Exercises Other Exercises Other Exercises: SUpine ther-ex performed including B LE SAQ, SLRs, hip abd/add, and attempted quad sets. ALl ther-ex performed x 10 reps with B LE needing min assist for completion. Other Exercises: Pt able to roll to B sides for donning diaper. ONly needs cga for assistance    General Comments        Pertinent Vitals/Pain Pain Assessment: No/denies pain    Home Living                      Prior Function            PT Goals (current goals can now be found in the care plan section) Acute Rehab PT Goals Patient Stated Goal: to get stronger PT Goal Formulation: With patient Time For Goal Achievement: 11/24/16 Potential to Achieve Goals: Good Progress towards PT goals: Progressing toward goals    Frequency    Min 2X/week  PT Plan Current plan remains appropriate    Co-evaluation             End of Session Equipment Utilized During Treatment: Gait belt Activity Tolerance: Patient tolerated treatment well Patient left: in chair;with chair alarm set Nurse Communication: Mobility status PT Visit Diagnosis: Unsteadiness on feet (R26.81);Muscle weakness (generalized) (M62.81)     Time: 9675-9163 PT Time Calculation (min) (ACUTE ONLY): 23 min  Charges:  $Gait Training: 8-22 mins $Therapeutic Exercise: 8-22 mins                    G Codes:       [name] [name], PT, DPT [phone]    [name] [name] 11/11/2016, 12:03 PM

## 2016-11-11 NOTE — Progress Notes (Signed)
Pharmacy Antibiotic Note  [name] [name] is a 83 y.o. female admitted on 11/06/2016 with bacteremia. Pharmacy has been consulted for vancomycin dosing.   4/17 Patient now Dx with HCAP. Pharmacy originally consulted for Zosyn, but patient has PCN allergy. Per on call hospitalist ok to start patient on cefepime.   4/12 Bcx: 3 of 4 bottles= Coag neg.Staph Species,  ( BCID showed Methicillin resistance.)  Plan: 4/17 Will start Cefepime 2mg  IV every 24 hours based on current CrCl <13ml/ml.   Day 5- continues on Vancomycin 750mg  IV Q18hr (for goal trough of 15-20). 4/16 Vancomycin trough resulted at 67mcg/ml. Will continue with current dosing, follow SCr. Repeat blood cultures are negative.   Height: 5\' 5"  (165.1 cm) Weight: 139 lb 1.8 oz (63.1 kg) IBW/kg (Calculated) : 57  Temp (24hrs), Avg:98.4 F (36.9 C), Min:98.1 F (36.7 C), Max:98.6 F (37 C)   Recent Labs Lab 11/06/16 0008 11/06/16 0402 11/06/16 0618  11/06/16 1619  11/07/16 0029 11/07/16 0401 11/07/16 0744 11/08/16 0645 11/09/16 0412 11/10/16 0504  WBC 15.9* 18.3*  --   --   --   --   --   --  14.2* 8.2  --   --   CREATININE 2.86* 2.85*  --   < > 1.82*  < > 1.58* 1.43*  --  0.90 0.82 0.76  LATICACIDVEN 7.2* 5.8* 4.8*  --  2.9*  --   --   --   --   --   --   --   VANCOTROUGH  --   --   --   --   --   --   --   --   --   --   --  14*  < > = values in this interval not displayed.  Estimated Creatinine Clearance: 48.8 mL/min (by C-G formula based on SCr of 0.76 mg/dL).    Allergies  Allergen Reactions  . Penicillins Rash    Has patient had a PCN reaction causing immediate rash, facial/tongue/throat swelling, SOB or lightheadedness with hypotension: No Has patient had a PCN reaction causing severe rash involving mucus membranes or skin necrosis: No Has patient had a PCN reaction that required hospitalization No Has patient had a PCN reaction occurring within the last 10 years: No If all of the above answers are  "NO", then may proceed with Cephalosporin use.     Antimicrobials this admission: Clindamycin 4/12 >> 4/13 Vancomycin 4/13 >>  Cefepime 4/17>>  Dose adjustments this admission: N/A  Microbiology results: 4/12 BCx: gram positive cocci 3/4; MRSS on BCID  4/12 UCx: negative  4/12 MRSA PCR: negative 4/13 BCx: [place] will continue to monitor and adjust per consult.   Pernell Dupre, PharmD, BCPS Clinical Pharmacist 11/11/2016 8:10 PM

## 2016-11-11 NOTE — Progress Notes (Addendum)
Notified by nurse tech that BG was 53. 4 oz of apple juice given. BG now 95 and asymptomatic. Pt to receive breakfast tray. Ammie Dalton, RN

## 2016-11-11 NOTE — Progress Notes (Signed)
[facility] [place] NAME: [name] [name]    MR#:  [id number]  DATE OF BIRTH:  1932-01-20  SUBJECTIVE:  CHIEF COMPLAINT:   Chief Complaint  Patient presents with  . Hyperglycemia  . Altered Mental Status   She feels comfortable today, denies any pain, Patient seemed to be a little bit more alert, able to answer a few questions yes and no.  Blood glucose levels are very low At 53 earlier today. Patient complains of hemoptysis earlier today, no significant amount of blood, denies any nausea, vomiting, hematemesis, not noted to have any black stool. Chest x-ray was concerning for pneumonia. Patient was seen by infectious disease specialist, Dr. Ola Spurr and recommended to continue vancomycin intravenously while in the hospital, then discharge on doxycycline orally to complete 14 day course. Patient admits of some clear to tan phlegm production REVIEW OF SYSTEMS:    Review of Systems  Unable to perform ROS: Mental status change    DRUG ALLERGIES:   Allergies  Allergen Reactions  . Penicillins Rash    Has patient had a PCN reaction causing immediate rash, facial/tongue/throat swelling, SOB or lightheadedness with hypotension: No Has patient had a PCN reaction causing severe rash involving mucus membranes or skin necrosis: No Has patient had a PCN reaction that required hospitalization No Has patient had a PCN reaction occurring within the last 10 years: No If all of the above answers are "NO", then may proceed with Cephalosporin use.     VITALS:  Blood pressure 109/60, pulse 92, temperature 98.1 F (36.7 C), temperature source Oral, resp. rate 18, height 5\' 5"  (1.651 m), weight 63.1 kg (139 lb 1.8 oz), SpO2 97 %.  PHYSICAL EXAMINATION:   Physical Exam  GENERAL:  83 y.o.-year-old patient lying in the bed. Some more alert today , able to open her eyes and answer simple questions yes and no, seems to be more Active EYES: Pupils equal,  round, reactive to light and accommodation. No scleral icterus. Extraocular muscles intact.  HEENT: Head atraumatic, normocephalic. Oropharynx and nasopharynx clear. Dry oral mucosa NECK:  Supple, no jugular venous distention. No thyroid enlargement, no tenderness.  LUNGS: Some diminished breath sounds bilaterally, no wheezing, rales, rhonchi. No use of accessory muscles of respiration.  CARDIOVASCULAR: S1, S2 normal. No murmurs, rubs, or gallops.  ABDOMEN: Soft, nontender, nondistended. Bowel sounds present. No organomegaly or mass.  EXTREMITIES: No cyanosis, clubbing or edema b/l.    NEUROLOGIC: Moves all 4 extremities SKIN: No obvious rash, lesion, or ulcer.   LABORATORY PANEL:   CBC  Recent Labs Lab 11/08/16 0645 11/09/16 0730  WBC 8.2  --   HGB 10.1* 10.3*  HCT 29.4*  --   PLT 161  --    ------------------------------------------------------------------------------------------------------------------ Chemistries   Recent Labs Lab 11/06/16 0008  11/10/16 0504  NA 132*  < > 140  K 6.4*  < > 3.1*  CL 93*  < > 109  CO2 <7*  < > 24  GLUCOSE 1,148*  < > 86  BUN 49*  < > 10  CREATININE 2.86*  < > 0.76  CALCIUM 9.2  < > 8.3*  MG 2.2  < > 1.9  AST 32  --   --   ALT 25  --   --   ALKPHOS 112  --   --   BILITOT 1.9*  --   --   < > = values in this interval not displayed. ------------------------------------------------------------------------------------------------------------------  Cardiac Enzymes  Recent Labs Lab 11/06/16 0817  TROPONINI 0.21*   ------------------------------------------------------------------------------------------------------------------  RADIOLOGY:  Dg Chest 2 View  Result Date: 11/11/2016 CLINICAL DATA:  Confusion.  Elevated blood sugar hemoptysis. EXAM: CHEST  2 VIEW COMPARISON:  Chest x-ray 11/06/2016, 03/11/2016. FINDINGS: Mediastinum hilar structures are normal. Bibasilar and right perihilar infiltrates noted consistent with  pneumonia. Small bilateral pleural effusions. Mild cardiomegaly. No pulmonary venous congestion. No pneumothorax. IMPRESSION: 1. Bibasilar and right perihilar infiltrates consistent with pneumonia. Small bilateral pleural effusions. 2. Mild cardiomegaly.  No pulmonary venous congestion. Electronically Signed   By: Marcello Moores  Register   On: 11/11/2016 09:02   ASSESSMENT AND PLAN:  *Hemoptysis of unclear etiology, chest x-ray was concerning for pneumonia, continue vancomycin, get CT angiogram of chest to rule out pulmonary embolism, confirmed pneumonia, initiate patient on Zosyn. If new for pneumonia, as confirmed, get sputum cultures if possible, get pulmonologist involved * DKA, Resolved, H, the patient's is blood glucose level was again found to be low at 50, discontinue insulin Lantus, continue  sliding scale short-acting insulin, blood glucose  was as low as 53 earlier today, continue watching blood glucose levels since patient is off D5 solution now , oral intake has improved to 100% * Acute encephalopathy, improved, advance diet to dysphagia 3 with thin liquids, get physical therapist involved, *Upper gastrointestinal bleeding,  per history, continue Protonix twice a day,   no active bleeding was noted, get gastroenterologist involved if recurrent bleeding, check hemoglobin level * AKI, resolved with IV fluids, follow closely after CT angiogram * Coagulase negative staph bacteremia. Continue vancomycin. Elevated pro calcitonin. Source remains unclear, repeated blood cultures are negative so far, the patient was seen by Dr. Ola Spurr who recommended to get transthoracic echo, continue vancomycin while in the hospital, changed to doxycycline to complete 2 week course as outpatient * Acute blood loss anemia, remains stable, follow closely and transfuse as needed *generalized weakness, physical therapist recommends skilled nursing facility placement    All the records are reviewed and case discussed with  Care Management/Social Workerr. Management plans discussed with the patient, family and they are in agreement.  CODE STATUS: DNR/DNI  DVT Prophylaxis: SCDs  TOTAL TIME TAKING CARE OF THIS PATIENT: 40 minutes.   Theodoro Grist M.D on 11/11/2016 at 5:16 PM  Between 7am to 6pm - Pager - [phone]  After 6pm go to [email or web address] - password [email or web address] [facility]  Office  [phone]  CC: Primary care physician; [name], DO  Note: This dictation was prepared with Dragon dictation along with smaller phrase technology. Any transcriptional errors that result from this process are unintentional.

## 2016-11-11 NOTE — Progress Notes (Signed)
Inpatient Diabetes Program Recommendations  AACE/ADA: New Consensus Statement on Inpatient Glycemic Control (2015)  Target Ranges:  Prepandial:   less than 140 mg/dL      Peak postprandial:   less than 180 mg/dL (1-2 hours)      Critically ill patients:  140 - 180 mg/dL   Lab Results  Component Value Date   GLUCAP 95 11/11/2016   HGBA1C 10.2 (H) 11/07/2016    Review of Glycemic Control  Results for MARSHELL, RIEGER (MRN [id number]) as of 11/11/2016 08:45  Ref. Range 11/10/2016 11:45 11/10/2016 16:45 11/10/2016 19:52 11/11/2016 07:37 11/11/2016 08:14  Glucose-Capillary Latest Ref Range: 65 - 99 mg/dL 91 353 (H) 280 (H) 53 (L) 95     Home DM Meds: Lantus 22 units QHS                             Novolog 8 units Breakfast/ 10 units Lunch/ 12 units Dinner                             Novolog SSI for high CBGs                             Metformin 500 mg BID  Current Insulin Orders: Novolog Moderate Correction Scale/ SSI (0-9 units) tid and Novolog 0-5 units qhs   MD- Note patient had Hypoglycemia this AM    Consider re-starting a portion of the Lantus insulin- consider Lantus10 units qhs   (hypo is likely a result of the 15 units of Novolog given at 1812 and the Novolog hs correction insulin given at 2020)   Gentry Fitz, RN, BA, MHA, CDE Diabetes Coordinator Inpatient Diabetes Program  [phone] (Team Pager) [phone] ([place]) 11/11/2016 9:09 AM

## 2016-11-12 ENCOUNTER — Other Ambulatory Visit: Payer: Self-pay | Admitting: *Deleted

## 2016-11-12 DIAGNOSIS — Z794 Long term (current) use of insulin: Principal | ICD-10-CM

## 2016-11-12 DIAGNOSIS — E1165 Type 2 diabetes mellitus with hyperglycemia: Secondary | ICD-10-CM

## 2016-11-12 LAB — GLUCOSE, CAPILLARY
GLUCOSE-CAPILLARY: 361 mg/dL — AB (ref 65–99)
Glucose-Capillary: 295 mg/dL — ABNORMAL HIGH (ref 65–99)
Glucose-Capillary: 245 mg/dL — ABNORMAL HIGH (ref 65–99)
Glucose-Capillary: 221 mg/dL — ABNORMAL HIGH (ref 65–99)

## 2016-11-12 LAB — BLOOD GAS, ARTERIAL
FIO2: 0.21
PO2 ART: 109 mmHg — AB (ref 83.0–108.0)
Patient temperature: 37
pCO2 arterial: 19 mmHg — CL (ref 32.0–48.0)
pH, Arterial: 7.07 — CL (ref 7.350–7.450)

## 2016-11-12 LAB — BASIC METABOLIC PANEL
ANION GAP: 4 — AB (ref 5–15)
BUN: 12 mg/dL (ref 6–20)
CALCIUM: 8.3 mg/dL — AB (ref 8.9–10.3)
CO2: 28 mmol/L (ref 22–32)
Chloride: 103 mmol/L (ref 101–111)
Creatinine, Ser: 0.86 mg/dL (ref 0.44–1.00)
Glucose, Bld: 311 mg/dL — ABNORMAL HIGH (ref 65–99)
POTASSIUM: 4.3 mmol/L (ref 3.5–5.1)
Sodium: 135 mmol/L (ref 135–145)

## 2016-11-12 LAB — CULTURE, BLOOD (ROUTINE X 2)
CULTURE: NO GROWTH
Culture: NO GROWTH
Special Requests: ADEQUATE

## 2016-11-12 MED ORDER — ATORVASTATIN CALCIUM 20 MG PO TABS
80.0000 mg | ORAL_TABLET | Freq: Every day | ORAL | Status: DC
  Administered 2016-11-12 – 2016-11-14 (×3): 80 mg via ORAL
  Filled 2016-11-12 (×3): qty 4

## 2016-11-12 MED ORDER — DOXYCYCLINE HYCLATE 100 MG PO TABS: 100.0000 mg | ORAL_TABLET | Freq: Two times a day (BID) | ORAL | 0 refills | Status: AC

## 2016-11-12 MED ORDER — INSULIN GLARGINE 100 UNIT/ML ~~LOC~~ SOLN
10.0000 [IU] | Freq: Every day | SUBCUTANEOUS | Status: DC
  Administered 2016-11-12: 10:00:00 10 [IU] via SUBCUTANEOUS
  Filled 2016-11-12 (×2): qty 0.1

## 2016-11-12 MED ORDER — INSULIN GLARGINE 100 UNIT/ML ~~LOC~~ SOLN
12.0000 [IU] | Freq: Every day | SUBCUTANEOUS | Status: DC
  Administered 2016-11-13 – 2016-11-14 (×2): 12 [IU] via SUBCUTANEOUS
  Filled 2016-11-12 (×2): qty 0.12

## 2016-11-12 MED ORDER — LEVOTHYROXINE SODIUM 100 MCG PO TABS
100.0000 ug | ORAL_TABLET | Freq: Every day | ORAL | Status: DC
  Administered 2016-11-13 – 2016-11-14 (×2): 100 ug via ORAL
  Filled 2016-11-12 (×2): qty 1

## 2016-11-12 MED ORDER — INSULIN GLARGINE 100 UNIT/ML ~~LOC~~ SOLN: 20.0000 [IU] | Freq: Every day | SUBCUTANEOUS | 11 refills | Status: DC

## 2016-11-12 MED ORDER — GLUCERNA SHAKE PO LIQD
237.0000 mL | Freq: Two times a day (BID) | ORAL | Status: DC
  Administered 2016-11-12 – 2016-11-14 (×3): 237 mL via ORAL

## 2016-11-12 MED ORDER — INSULIN GLARGINE 100 UNIT/ML ~~LOC~~ SOLN: 10.0000 [IU] | Freq: Every day | SUBCUTANEOUS | 11 refills | Status: DC

## 2016-11-12 MED ORDER — LEVOFLOXACIN 750 MG PO TABS: 750.0000 mg | ORAL_TABLET | Freq: Every day | ORAL | 0 refills | Status: DC

## 2016-11-12 NOTE — Discharge Summary (Addendum)
[facility] NAME: [name] [name]    MR#:  [id number]  DATE OF BIRTH:  Jul 27, 1932  DATE OF ADMISSION:  11/06/2016 ADMITTING PHYSICIAN: Saundra Shelling, MD  DATE OF DISCHARGE: 11/14/2016  PRIMARY CARE PHYSICIAN: [name], DO    ADMISSION DIAGNOSIS:  Hyperkalemia [E87.5] Hyperglycemia [R73.9] Elevated lactic acid level [R79.89] Acute renal failure, unspecified acute renal failure type ([place]) [N17.9] Altered mental status, unspecified altered mental status type [R41.82] Diabetic ketoacidosis with coma associated with type 2 diabetes mellitus ([place]) [E11.11]  DISCHARGE DIAGNOSIS:  DKA HCAP Acute encephalopathy  SECONDARY DIAGNOSIS:   Past Medical History:  Diagnosis Date  . Anxiety state, unspecified   . CAD (coronary artery disease)   . Cancer [facility])    2004 Right breast, found on mammogram, radiation therapy, Dr. Bryson Ha, uterine cancer,   . COPD (chronic obstructive pulmonary disease) ([place])   . Diabetes mellitus   . Esophageal reflux   . Heart burn   . Hyperlipidemia   . IBS (irritable bowel syndrome)   . Myocardial infarction [facility])    Cath negative except for 40% occlusion LAD.  Pt not candidate for betablocker or ACEI because of hypotension  . OSA (obstructive sleep apnea)   . Osteoporosis 02/21/09   DEXA scan showed osteoporosis with left femur T-score -2.8.  Marland Kitchen Presbyacusis   . Stroke ([place])   . Thyroid disease    Hypothyroid  . Vitamin D deficiency     HOSPITAL COURSE:   83 year old female with a history of diabetes and COPD who presented with change in mental status and was found to have DKA.  1. Acute encephalopathy in the setting of DKA: Patient's mental status is now baseline.  2. DKA: Patient was initially placed on DKA protocol. This is resolved. She will continue on ADA diet with metformin and long-acting insulin. She'll follow-up with her endocrinologist.  3.HCAP: Patient had one episode of  hemoptysis. Chest x-ray showed pneumonia. CT scan was negative for pulmonary emboli. She will be discharged on Levaquin.  4. Acute kidney injury due to DKA: This is resolved  5. Coag-negative staph bacteremia: ID was consulted regarding abnormal blood cultures. Dr. Ola Spurr recommended obtaining echocardiogram and continue vancomycin while in the hospital. Patient will be discharged on doxycycline to complete a two-week course. ECHO Study Conclusions  - Procedure narrative: Transthoracic echocardiography. The study   was technically difficult, as a result of restricted patient   mobility. - Left ventricle: The cavity size was normal. Systolic function was   normal. The estimated ejection fraction was in the range of 50%   to 55%. - Aortic valve: There was trivial regurgitation. Valve area (Vmax):   2.76 cm^2.  6. Hypothyroid: Patient will continue on Synthroid 100 g daily  7. Acute on chronic blood loss anemia: Hemoglobin is stable  8. History CVA: Patient will continue on aspirin, statin and Plavix  Patient's daughter has appealed the discharge but patient has been medically stable for discharge since April 18.  DISCHARGE CONDITIONS AND DIET:   Stable Dysphasia 3 diet thin  Patient will need speech evaluation to advance diet  CONSULTS OBTAINED:  Treatment Team:  Leonel Ramsay, MD  DRUG ALLERGIES:   Allergies  Allergen Reactions  . Penicillins Rash    Has patient had a PCN reaction causing immediate rash, facial/tongue/throat swelling, SOB or lightheadedness with hypotension: No Has patient had a PCN reaction causing severe rash involving mucus membranes or skin necrosis: No Has  patient had a PCN reaction that required hospitalization No Has patient had a PCN reaction occurring within the last 10 years: No If all of the above answers are "NO", then may proceed with Cephalosporin use.     DISCHARGE MEDICATIONS:   Current Discharge Medication List    START  taking these medications   Details  doxycycline (VIBRA-TABS) 100 MG tablet Take 1 tablet (100 mg total) by mouth 2 (two) times daily. Qty: 14 tablet, Refills: 0    feeding supplement, GLUCERNA SHAKE, (GLUCERNA SHAKE) LIQD Take 237 mLs by mouth 2 (two) times daily between meals. Qty: 9480 mL, Refills: 0    levofloxacin (LEVAQUIN) 750 MG tablet Take 1 tablet (750 mg total) by mouth daily. Qty: 5 tablet, Refills: 0      CONTINUE these medications which have CHANGED   Details  insulin glargine (LANTUS) 100 UNIT/ML injection Inject 0.14 mLs (14 Units total) into the skin daily. Qty: 10 mL, Refills: 11      CONTINUE these medications which have NOT CHANGED   Details  aspirin EC 81 MG tablet Take 1 tablet (81 mg total) by mouth daily. Qty: 90 tablet, Refills: 3    atorvastatin (LIPITOR) 80 MG tablet Take 1 tablet (80 mg total) by mouth daily. Qty: 90 tablet, Refills: 3    clopidogrel (PLAVIX) 75 MG tablet Take 1 tablet (75 mg total) by mouth daily. Qty: 30 tablet, Refills: 2    fluticasone (FLONASE) 50 MCG/ACT nasal spray Place 2 sprays into both nostrils daily.     metFORMIN (GLUCOPHAGE) 500 MG tablet TAKE 1 TABLET(500 MG) BY MOUTH TWICE DAILY WITH A MEAL Qty: 180 tablet, Refills: 1    pantoprazole (PROTONIX) 40 MG tablet Take 1 tablet (40 mg total) by mouth 2 (two) times daily. Qty: 180 tablet, Refills: 3    glucagon 1 MG injection Inject 1 mg into the vein once as needed.    levothyroxine (SYNTHROID, LEVOTHROID) 100 MCG tablet TAKE 1 TABLET(100 MCG) BY MOUTH DAILY Qty: 90 tablet, Refills: 3      STOP taking these medications     insulin aspart (NOVOLOG) 100 UNIT/ML injection           Today   CHIEF COMPLAINT:   No acute issues overnight Ready for discharge  VITAL SIGNS:  Blood pressure 113/60, pulse 85, temperature 98.3 F (36.8 C), temperature source Oral, resp. rate 16, height 5\' 5"  (1.651 m), weight 63.1 kg (139 lb 1.8 oz), SpO2 95 %.   REVIEW OF  SYSTEMS:  Review of Systems  Constitutional: Negative.  Negative for chills, fever and malaise/fatigue.  HENT: Negative.  Negative for ear discharge, ear pain, hearing loss, nosebleeds and sore throat.   Eyes: Negative.  Negative for blurred vision and pain.  Respiratory: Negative.  Negative for cough, hemoptysis, shortness of breath and wheezing.   Cardiovascular: Negative.  Negative for chest pain, palpitations and leg swelling.  Gastrointestinal: Negative.  Negative for abdominal pain, blood in stool, diarrhea, nausea and vomiting.  Genitourinary: Negative.  Negative for dysuria.  Musculoskeletal: Negative.  Negative for back pain.  Skin: Negative.   Neurological: Negative for dizziness, tremors, speech change, focal weakness, seizures and headaches.  Endo/Heme/Allergies: Negative.  Does not bruise/bleed easily.  Psychiatric/Behavioral: Positive for memory loss. Negative for depression, hallucinations and suicidal ideas.     PHYSICAL EXAMINATION:  GENERAL:  83 y.o.-year-old patient lying in the bed with no acute distress.  NECK:  Supple, no jugular venous distention. No thyroid enlargement, no tenderness.  LUNGS: Normal breath sounds bilaterally, no wheezing, rales,rhonchi  No use of accessory muscles of respiration.  CARDIOVASCULAR: S1, S2 normal. No murmurs, rubs, or gallops.  ABDOMEN: Soft, non-tender, non-distended. Bowel sounds present. No organomegaly or mass.  EXTREMITIES: No pedal edema, cyanosis, or clubbing.  PSYCHIATRIC: The patient is alert and oriented x place and name SKIN: No obvious rash, lesion, or ulcer.   DATA REVIEW:   CBC  Recent Labs Lab 11/08/16 0645  11/11/16 1729  WBC 8.2  --   --   HGB 10.1*  < > 9.6*  HCT 29.4*  --   --   PLT 161  --   --   < > = values in this interval not displayed.  Chemistries   Recent Labs Lab 11/14/16 0511  NA 132*  K 4.6  CL 97*  CO2 29  GLUCOSE 268*  BUN 9  CREATININE 0.88  CALCIUM 8.3*  MG 1.7    Cardiac  Enzymes No results for input(s): TROPONINI in the last 168 hours.  Microbiology Results  @MICRORSLT48 @  RADIOLOGY:  No results found.    Current Discharge Medication List    START taking these medications   Details  doxycycline (VIBRA-TABS) 100 MG tablet Take 1 tablet (100 mg total) by mouth 2 (two) times daily. Qty: 14 tablet, Refills: 0    feeding supplement, GLUCERNA SHAKE, (GLUCERNA SHAKE) LIQD Take 237 mLs by mouth 2 (two) times daily between meals. Qty: 9480 mL, Refills: 0    levofloxacin (LEVAQUIN) 750 MG tablet Take 1 tablet (750 mg total) by mouth daily. Qty: 5 tablet, Refills: 0      CONTINUE these medications which have CHANGED   Details  insulin glargine (LANTUS) 100 UNIT/ML injection Inject 0.14 mLs (14 Units total) into the skin daily. Qty: 10 mL, Refills: 11      CONTINUE these medications which have NOT CHANGED   Details  aspirin EC 81 MG tablet Take 1 tablet (81 mg total) by mouth daily. Qty: 90 tablet, Refills: 3    atorvastatin (LIPITOR) 80 MG tablet Take 1 tablet (80 mg total) by mouth daily. Qty: 90 tablet, Refills: 3    clopidogrel (PLAVIX) 75 MG tablet Take 1 tablet (75 mg total) by mouth daily. Qty: 30 tablet, Refills: 2    fluticasone (FLONASE) 50 MCG/ACT nasal spray Place 2 sprays into both nostrils daily.     metFORMIN (GLUCOPHAGE) 500 MG tablet TAKE 1 TABLET(500 MG) BY MOUTH TWICE DAILY WITH A MEAL Qty: 180 tablet, Refills: 1    pantoprazole (PROTONIX) 40 MG tablet Take 1 tablet (40 mg total) by mouth 2 (two) times daily. Qty: 180 tablet, Refills: 3    glucagon 1 MG injection Inject 1 mg into the vein once as needed.    levothyroxine (SYNTHROID, LEVOTHROID) 100 MCG tablet TAKE 1 TABLET(100 MCG) BY MOUTH DAILY Qty: 90 tablet, Refills: 3      STOP taking these medications     insulin aspart (NOVOLOG) 100 UNIT/ML injection          Management plans discussed with the patient and she  is in agreement. Stable for discharge  SNF  Patient should follow up with pcp  CODE STATUS:     Code Status Orders        Start     Ordered   11/06/16 1127  Do not attempt resuscitation (DNR)  Continuous    Question Answer Comment  In the event of cardiac or respiratory ARREST Do not call a "  code blue"   In the event of cardiac or respiratory ARREST Do not perform Intubation, CPR, defibrillation or ACLS   In the event of cardiac or respiratory ARREST Use medication by any route, position, wound care, and other measures to relive pain and suffering. May use oxygen, suction and manual treatment of airway obstruction as needed for comfort.      11/06/16 1126    Code Status History    Date Active Date Inactive Code Status Order ID Comments User Context   11/06/2016  3:25 AM 11/06/2016 11:26 AM Full Code [id number]  Saundra Shelling, MD Inpatient   07/15/2016  6:36 PM 07/17/2016 10:11 PM Full Code [id number]  Fritzi Mandes, MD Inpatient   06/08/2015  9:01 PM 06/11/2015  9:20 PM Full Code [id number]  Henreitta Leber, MD Inpatient   06/08/2015  5:16 PM 06/08/2015  9:01 PM Full Code [id number]  Henreitta Leber, MD ED    Advance Directive Documentation     Most Recent Value  Type of Advance Directive  Healthcare Power of Attorney  Pre-existing out of facility DNR order (yellow form or pink MOST form)  -  "MOST" Form in Place?  -      TOTAL TIME TAKING CARE OF THIS PATIENT: 37 minutes.    Note: This dictation was prepared with Dragon dictation along with smaller phrase technology. Any transcriptional errors that result from this process are unintentional.  Nainoa Woldt M.D on 11/14/2016 at 10:58 AM  Between 7am to 6pm - Pager - [phone] After 6pm go to [email or web address] - password [email or web address] [facility]  Office  [phone]  CC: Primary care physician; [name], DO

## 2016-11-12 NOTE — Plan of Care (Signed)
Problem: Bowel/Gastric: Goal: Will not experience complications related to bowel motility Outcome: Not Progressing Pt has not had a BM since 4/14.

## 2016-11-12 NOTE — Care Management (Signed)
Notified that the daughter of Ms. Jain has appealed her discharge per [facility]. Telephone call to daughter, Orena Cavazos to sign Detailed Notice of Discharge and An important Message From Medicare About Your Rights documents. States she lives in [place], but would come to the hospital. After daughter signs documents, will give to [facility] CM to send to [facility]. Shelbie Ammons RN MSN CCM Care Management [phone]

## 2016-11-12 NOTE — Progress Notes (Signed)
MEDICATION RELATED CONSULT NOTE - INITIAL   Pharmacy Consult for Electrolyte management Indication: hypomagnesemia, hypophosphatemia  Allergies  Allergen Reactions  . Penicillins Rash    Has patient had a PCN reaction causing immediate rash, facial/tongue/throat swelling, SOB or lightheadedness with hypotension: No Has patient had a PCN reaction causing severe rash involving mucus membranes or skin necrosis: No Has patient had a PCN reaction that required hospitalization No Has patient had a PCN reaction occurring within the last 10 years: No If all of the above answers are "NO", then may proceed with Cephalosporin use.     Patient Measurements: Height: 5\' 5"  (165.1 cm) Weight: 139 lb 1.8 oz (63.1 kg) IBW/kg (Calculated) : 57 Adjusted Body Weight:    Vital Signs: Temp: 98.8 F (37.1 C) (04/18 0354) Temp Source: Oral (04/18 0354) BP: 120/51 (04/18 0354) Pulse Rate: 78 (04/18 0354) Intake/Output from previous day: 04/17 0701 - 04/18 0700 In: 994.2 [P.O.:120; I.V.:374.2; IV Piggyback:500] Out: -  Intake/Output from this shift: No intake/output data recorded.  Labs:  Recent Labs  11/10/16 0504 11/11/16 1729 11/12/16 0358  HGB  --  9.6*  --   CREATININE 0.76  --  0.86  MG 1.9  --   --   PHOS 2.1*  --   --     Lab Results  Component Value Date   K 4.3 11/12/2016   Estimated Creatinine Clearance: 45.4 mL/min (by C-G formula based on SCr of 0.86 mg/dL).   Medical History: Past Medical History:  Diagnosis Date  . Anxiety state, unspecified   . CAD (coronary artery disease)   . Cancer [facility])    2004 Right breast, found on mammogram, radiation therapy, Dr. Bryson Ha, uterine cancer,   . COPD (chronic obstructive pulmonary disease) ([place])   . Diabetes mellitus   . Esophageal reflux   . Heart burn   . Hyperlipidemia   . IBS (irritable bowel syndrome)   . Myocardial infarction [facility])    Cath negative except for 40% occlusion LAD.  Pt not candidate for betablocker or  ACEI because of hypotension  . OSA (obstructive sleep apnea)   . Osteoporosis 02/21/09   DEXA scan showed osteoporosis with left femur T-score -2.8.  Marland Kitchen Presbyacusis   . Stroke ([place])   . Thyroid disease    Hypothyroid  . Vitamin D deficiency     Medications:  Scheduled:  . chlorpheniramine-HYDROcodone  5 mL Oral Q12H  . enoxaparin (LOVENOX) injection  40 mg Subcutaneous Q24H  . insulin aspart  0-15 Units Subcutaneous TID WC  . insulin aspart  0-5 Units Subcutaneous QHS  . insulin glargine  10 Units Subcutaneous Daily  . mouth rinse  15 mL Mouth Rinse BID  . pantoprazole sodium  40 mg Per Tube BID   Infusions:  . 0.9 % NaCl with KCl 20 mEq / L 50 mL/hr at 11/11/16 1956  . ceFEPIme (MAXIPIME) 2 GM IVP Stopped (11/12/16 4008)  . vancomycin      Assessment: 83 yo F admitted with DKA, upper GI bleed, AKI.  Plan:  4/15: K 3.1, Mag 1.9, Phos 2.1, Scr 0.76 Patient is on D5W w/ 20 meq KCL at 50 ml/hr. Will order Potassium Phosphate 15 mmol IV x 1. Will f/u with am labs.  4/18 0358 K 4.3, Ca 8.3. Patient remains on NS with 20 mEq/L @ 50 mL/hr to provide approximately 24 mEq potassium per day. No further supplement at this time. Will recheck all electrolytes tomorrow with AM labs.  Colon Rueth A.  Jordan Hawks, PharmD, BCPS Clinical Pharmacist 11/12/2016 7:43 AM

## 2016-11-12 NOTE — Progress Notes (Signed)
Called patient's daughter about discharge planning she had me call home number but left message. Attempted to call patient's room as nurse reports she is now there, but she is not.  I will speak to her later today when she comes back.

## 2016-11-12 NOTE — Patient Outreach (Signed)
Referral for [facility] social worker to follow pt when discharge to [facility].    [name]   [facility]  [phone]   Addendum:  Review in Epic- RN  CM note today, daughter of Ms. Scheunemann has appealed pt's discharge per [facility].  This RN CM will keep [facility] social worker informed.   [name]   [facility]  [phone]

## 2016-11-12 NOTE — Clinical Social Work Note (Signed)
CSW notified [facility] of delay in discharge due to pt's daughter appeal. CSW will continue to follow.   Darden Dates, MSW, LCSW Clinical Social Worker  [phone]

## 2016-11-12 NOTE — Consult Note (Signed)
   [facility] CM Inpatient Consult   11/12/2016  [name] [name] [birth year] [id number]   Patient is currently active with [facility] for chronic disease management services.  Patient has been engaged by a [employer].  Our community based plan of care has focused on disease management and community resource support.  Patient will receive a post discharge transition of care call and will be evaluated for monthly home visits for assessments and disease process education.  Made Inpatient Case Manager aware that [facility] following. Of note, [facility] Care Management services does not replace or interfere with any services that are needed or arranged by inpatient case management or social work.  For additional questions or referrals please contact:  Maximilliano Kersh RN, [facility] Liaison  [phone]) [place] [phone]) Toll free office

## 2016-11-12 NOTE — Plan of Care (Signed)
Problem: SLP Dysphagia Goals Goal: Misc Dysphagia Goal Pt will safely tolerate po diet of least restrictive consistency w/ no overt s/s of aspiration noted by Staff/pt/family x3 sessions.    

## 2016-11-12 NOTE — Progress Notes (Signed)
Nutrition Follow-up  DOCUMENTATION CODES:   Severe malnutrition in context of acute illness/injury  INTERVENTION:  Recommend Glucerna Shake po BID, each supplement provides 220 kcal and 10 grams of protein. Discussed recommendation with patient.  NUTRITION DIAGNOSIS:   Malnutrition (Severe) related to acute illness as evidenced by moderate depletion of body fat, moderate depletions of muscle mass, percent weight loss.  Ongoing.  GOAL:   Patient will meet greater than or equal to 90% of their needs  Progressing.  MONITOR:   Diet advancement, Labs, Weight trends  REASON FOR ASSESSMENT:   Consult Assessment of nutrition requirement/status  ASSESSMENT:    83 yo female admitted with acute encephalopathy, DKA, ARF, acute GI bleed, septicimia. Pt with hx of CAD, COPD, DM, HLD, IBS, CVA   -Patient advanced to dysphagia 1 diet with nectar-thick liquids on 4/15. Advanced to dysphagia 2 diet with nectar-thick liquids on 4/17. Today advanced to dysphagia 3 diet with thin liquids.  Spoke with patient at bedside. She reports her appetite continues to slowly improve but that she is still not eating as much as she used to. Denies any N/V or abdominal pain.   Meal Completion: 50-100% Patient had 50% of breakfast this morning (approximately 500 kcal, 20 grams of protein). Lunch and dinner yesterday was not documented and patient cannot remember what she ate, so unable to tell exactly how well patient is meeting her estimated kcal/protein needs.  Medications reviewed and include: Novolog sliding scale TID with meals and daily at bedtime, Lantus 10 units daily, pantoprazole 40 mg BID, NS with KCl 20 mEq/L @ 50 ml/hr, vancomycin.  Labs reviewed: CBG 199-361 past 24 hrs.   Diet Order:  DIET DYS 3 Room service appropriate? Yes with Assist; Fluid consistency: Thin  Skin:  Reviewed, no issues  Last BM:  11/08/2016  Height:   Ht Readings from Last 1 Encounters:  11/06/16 5\' 5"  (1.651 m)     Weight:   Wt Readings from Last 1 Encounters:  11/06/16 139 lb 1.8 oz (63.1 kg)    Ideal Body Weight:     BMI:  Body mass index is 23.15 kg/m.  Estimated Nutritional Needs:   Kcal:  1575-1775 kcals  Protein:  79-89 g  Fluid:  >/= 1.6 L  EDUCATION NEEDS:   No education needs identified at this time  Willey Blade, MS, RD, LDN Pager: [phone] After Hours Pager: [phone]

## 2016-11-12 NOTE — Progress Notes (Signed)
[place] INFECTIOUS DISEASE PROGRESS NOTE Date of Admission:  11/06/2016     ID: [name] [name] is a 82 y.o. female with CNS bacteremia  Principal Problem:   Acidosis, metabolic Active Problems:   Altered mental status   Protein-calorie malnutrition, severe   Subjective: No fevers, back to baseline, more awake today   ROS  Eleven systems are reviewed and negative except per hpi  Medications:  Antibiotics Given (last 72 hours)    Date/Time Action Medication Dose Rate   11/10/16 0531 Given   vancomycin (VANCOCIN) IVPB 750 mg/150 ml premix 750 mg 150 mL/hr   11/11/16 0030 Given   vancomycin (VANCOCIN) IVPB 750 mg/150 ml premix 750 mg 150 mL/hr   11/11/16 1737 Given   vancomycin (VANCOCIN) IVPB 750 mg/150 ml premix 750 mg 150 mL/hr   11/12/16 1245 Given   vancomycin (VANCOCIN) IVPB 750 mg/150 ml premix 750 mg 150 mL/hr     . atorvastatin  80 mg Oral q1800  . chlorpheniramine-HYDROcodone  5 mL Oral Q12H  . enoxaparin (LOVENOX) injection  40 mg Subcutaneous Q24H  . feeding supplement (GLUCERNA SHAKE)  237 mL Oral BID BM  . insulin aspart  0-15 Units Subcutaneous TID WC  . insulin aspart  0-5 Units Subcutaneous QHS  . insulin glargine  10 Units Subcutaneous Daily  . levothyroxine  100 mcg Oral QAC breakfast  . mouth rinse  15 mL Mouth Rinse BID  . pantoprazole sodium  40 mg Per Tube BID    Objective: Vital signs in last 24 hours: Temp:  [97.8 F (36.6 C)-98.8 F (37.1 C)] 97.8 F (36.6 C) (04/18 1403) Pulse Rate:  [76-86] 86 (04/18 1403) Resp:  [18-20] 18 (04/18 0354) BP: (109-120)/(49-62) 114/57 (04/18 1403) SpO2:  [94 %-100 %] 94 % (04/18 1403) Constitutional:  Very frail, sitting up in bed, dishelved HENT: [place]/AT, PERRLA, no scleral icterus Mouth/Throat: Oropharynx is clear and dry . No oropharyngeal exudate.  Cardiovascular: Normal rate, regular rhythm and normal heart sounds.  Pulmonary/Chest: Effort normal and breath sounds normal. No respiratory  distress.  has no wheezes.  Neck = supple, no nuchal rigidity  Abdominal: Soft. Bowel sounds are normal.  exhibits no distension. There is no tenderness.  Lymphadenopathy: no cervical adenopathy. No axillary adenopathy Neurological: alert,, moves all 4  Skin: Skin is warm and dry. No rash noted. No erythema.  Psychiatric: a normal mood and affect.  behavior is normal.   Lab Results  Recent Labs  11/10/16 0504 11/11/16 1729 11/12/16 0358  HGB  --  9.6*  --   NA 140  --  135  K 3.1*  --  4.3  CL 109  --  103  CO2 24  --  28  BUN 10  --  12  CREATININE 0.76  --  0.86    Microbiology: Results for orders placed or performed during the hospital encounter of 11/06/16  Urine culture     Status: None   Collection Time: 11/06/16 12:08 AM  Result Value Ref Range Status   Specimen Description URINE, CATHETERIZED  Final   Special Requests NONE  Final   Culture   Final    NO GROWTH Performed at [facility], [place] [address] [place], [place] [address]    Report Status 11/07/2016 FINAL  Final  Blood Culture (routine x 2)     Status: Abnormal   Collection Time: 11/06/16  1:06 AM  Result Value Ref Range Status   Specimen Description BLOOD LEFT  ANTECUBITAL  Final   Special Requests Blood Culture adequate volume  Final   Culture  Setup Time   Final    GRAM POSITIVE COCCI AEROBIC BOTTLE ONLY CRITICAL VALUE NOTED.  VALUE IS CONSISTENT WITH PREVIOUSLY REPORTED AND CALLED VALUE.    Culture STAPHYLOCOCCUS SPECIES (COAGULASE NEGATIVE) (A)  Final   Report Status 11/09/2016 FINAL  Final   Organism ID, Bacteria STAPHYLOCOCCUS SPECIES (COAGULASE NEGATIVE)  Final      Susceptibility   Staphylococcus species (coagulase negative) - MIC*    CIPROFLOXACIN 4 RESISTANT Resistant     ERYTHROMYCIN >=8 RESISTANT Resistant     GENTAMICIN <=0.5 SENSITIVE Sensitive     OXACILLIN <=0.25 SENSITIVE Sensitive     TETRACYCLINE <=1 SENSITIVE Sensitive     VANCOMYCIN 1 SENSITIVE Sensitive      TRIMETH/SULFA 20 SENSITIVE Sensitive     CLINDAMYCIN RESISTANT Resistant     RIFAMPIN <=0.5 SENSITIVE Sensitive     Inducible Clindamycin POSITIVE Resistant     * STAPHYLOCOCCUS SPECIES (COAGULASE NEGATIVE)  Blood Culture (routine x 2)     Status: Abnormal   Collection Time: 11/06/16  1:06 AM  Result Value Ref Range Status   Specimen Description BLOOD RIGHT ANTECUBITAL  Final   Special Requests Blood Culture adequate volume  Final   Culture  Setup Time   Final    GRAM POSITIVE COCCI IN BOTH AEROBIC AND ANAEROBIC BOTTLES CRITICAL RESULT CALLED TO, READ BACK BY AND VERIFIED WITH: JASON ROBBINS AT 1833 ON 11/06/16 BY SNJ    Culture (A)  Final    STAPHYLOCOCCUS SPECIES (COAGULASE NEGATIVE) SUSCEPTIBILITIES PERFORMED ON PREVIOUS CULTURE WITHIN THE LAST 5 DAYS. Performed at [facility], [place] [address] [place], [place] [address]    Report Status 11/09/2016 FINAL  Final  Blood Culture ID Panel (Reflexed)     Status: Abnormal   Collection Time: 11/06/16  1:06 AM  Result Value Ref Range Status   Enterococcus species NOT DETECTED NOT DETECTED Final   Listeria monocytogenes NOT DETECTED NOT DETECTED Final   Staphylococcus species DETECTED (A) NOT DETECTED Final    Comment: Methicillin (oxacillin) resistant coagulase negative staphylococcus. Possible blood culture contaminant (unless isolated from more than one blood culture draw or clinical case suggests pathogenicity). No antibiotic treatment is indicated for blood  culture contaminants. CRITICAL RESULT CALLED TO, READ BACK BY AND VERIFIED WITH: JASON ROBBINS AT 6759 ON 11/06/16 BY SNJ    Staphylococcus aureus NOT DETECTED NOT DETECTED Final   Methicillin resistance DETECTED (A) NOT DETECTED Final    Comment: CRITICAL RESULT CALLED TO, READ BACK BY AND VERIFIED WITH: JASON ROBBINS AT 1638 ON 11/06/16 BY SNJ    Streptococcus species NOT DETECTED NOT DETECTED Final   Streptococcus agalactiae NOT DETECTED NOT DETECTED Final    Streptococcus pneumoniae NOT DETECTED NOT DETECTED Final   Streptococcus pyogenes NOT DETECTED NOT DETECTED Final   Acinetobacter baumannii NOT DETECTED NOT DETECTED Final   Enterobacteriaceae species NOT DETECTED NOT DETECTED Final   Enterobacter cloacae complex NOT DETECTED NOT DETECTED Final   Escherichia coli NOT DETECTED NOT DETECTED Final   Klebsiella oxytoca NOT DETECTED NOT DETECTED Final   Klebsiella pneumoniae NOT DETECTED NOT DETECTED Final   Proteus species NOT DETECTED NOT DETECTED Final   Serratia marcescens NOT DETECTED NOT DETECTED Final   Haemophilus influenzae NOT DETECTED NOT DETECTED Final   Neisseria meningitidis NOT DETECTED NOT DETECTED Final   Pseudomonas aeruginosa NOT DETECTED NOT DETECTED Final   Candida albicans NOT DETECTED  NOT DETECTED Final   Candida glabrata NOT DETECTED NOT DETECTED Final   Candida krusei NOT DETECTED NOT DETECTED Final   Candida parapsilosis NOT DETECTED NOT DETECTED Final   Candida tropicalis NOT DETECTED NOT DETECTED Final  MRSA PCR Screening     Status: None   Collection Time: 11/06/16  3:28 AM  Result Value Ref Range Status   MRSA by PCR NEGATIVE NEGATIVE Final    Comment:        The GeneXpert MRSA Assay (FDA approved for NASAL specimens only), is one component of a comprehensive MRSA colonization surveillance program. It is not intended to diagnose MRSA infection nor to guide or monitor treatment for MRSA infections.   CULTURE, BLOOD (ROUTINE X 2) w Reflex to ID Panel     Status: None   Collection Time: 11/07/16  2:56 PM  Result Value Ref Range Status   Specimen Description BLOOD RIGHT HAND  Final   Special Requests   Final    BOTTLES DRAWN AEROBIC AND ANAEROBIC Blood Culture results may not be optimal due to an excessive volume of blood received in culture bottles   Culture NO GROWTH 5 DAYS  Final   Report Status 11/12/2016 FINAL  Final  CULTURE, BLOOD (ROUTINE X 2) w Reflex to ID Panel     Status: None   Collection  Time: 11/07/16  3:06 PM  Result Value Ref Range Status   Specimen Description BLOOD LEFT HAND  Final   Special Requests   Final    BOTTLES DRAWN AEROBIC AND ANAEROBIC Blood Culture adequate volume   Culture NO GROWTH 5 DAYS  Final   Report Status 11/12/2016 FINAL  Final    Studies/Results: Dg Chest 2 View  Result Date: 11/11/2016 CLINICAL DATA:  Confusion.  Elevated blood sugar hemoptysis. EXAM: CHEST  2 VIEW COMPARISON:  Chest x-ray 11/06/2016, 03/11/2016. FINDINGS: Mediastinum hilar structures are normal. Bibasilar and right perihilar infiltrates noted consistent with pneumonia. Small bilateral pleural effusions. Mild cardiomegaly. No pulmonary venous congestion. No pneumothorax. IMPRESSION: 1. Bibasilar and right perihilar infiltrates consistent with pneumonia. Small bilateral pleural effusions. 2. Mild cardiomegaly.  No pulmonary venous congestion. Electronically Signed   By: Marcello Moores  Register   On: 11/11/2016 09:02   Ct Angio Chest Pe W Or Wo Contrast  Result Date: 11/11/2016 CLINICAL DATA:  Hemoptysis earlier today. Diminished breath sounds bilaterally. Altered mental status. EXAM: CT ANGIOGRAPHY CHEST WITH CONTRAST TECHNIQUE: Multidetector CT imaging of the chest was performed using the standard protocol during bolus administration of intravenous contrast. Multiplanar CT image reconstructions and MIPs were obtained to evaluate the vascular anatomy. CONTRAST:  75 mL Isovue 370 IV. COMPARISON:  CT 06/08/2015 and 06/16/2011 FINDINGS: Cardiovascular: Heart size is within normal. There is calcified plaque over the left anterior descending coronary artery. Mild calcified plaque over the thoracic aorta. Pulmonary arterial system is within normal without evidence of emboli. Mediastinum/Nodes: No evidence of mediastinal or hilar adenopathy. Small hiatal hernia. Lungs/Pleura: Lungs are adequately inflated and demonstrate a small right pleural effusion and tiny amount of left pleural fluid. Mild  associated bibasilar atelectasis. Airways are within normal. Upper Abdomen: Calcified plaque over the abdominal aorta. Musculoskeletal: Mild degenerate change of the spine. Review of the MIP images confirms the above findings. IMPRESSION: No evidence of pulmonary embolism. Small right-sided pleural effusion and tiny amount of left pleural fluid with mild bibasilar atelectasis. Mild atherosclerotic coronary artery disease. Aortic atherosclerosis. Electronically Signed   By: Marin Olp M.D.   On: 11/11/2016 20:06  Assessment/Plan: [name] [name] is a 84 y.o. female admitted in DKA and found to have 2/2 BCX + CNS. She has recovered back to baseline.  No intravascular devices noted, no PPM or valves. TTE is neg for vegetaion.  FU bcx negative. CT neg for PNA despite iniital CXR read.  I do not think she likely has a true endovascular infection.  Suspect Momeyer may have been drawn as same time at admit.  Day 7  of vanco.   Recommendations Continue vanco while inpatient but at DC can change to oral doxycycline 100 mg bid to complete 14 total days of abx therapy. Thank you very much for the consult. Will follow with you.  Io Dieujuste P   11/12/2016, 2:19 PM

## 2016-11-12 NOTE — Progress Notes (Signed)
Spoke with Dr. Benjie Karvonen to determine if PCCM team needed to see pt today 04/18 due to Intensivist consult order placed 11/11/16 by admitting physician for management of hemoptysis.  Dr. Benjie Karvonen stated PCCM does not need to see the pt and she is canceling the consult according to discharge summary pt had one episode of hemoptysis.  Please contact PCCM team by calling on call pager if you need further assistance or have any questions.    Marda Stalker, [place] Pager [phone] (please enter 7 digits) [place] Pager [phone] (please enter 7 digits)   Merton Border, MD PCCM service Mobile [phone] Pager [phone] 11/13/2016

## 2016-11-12 NOTE — Progress Notes (Signed)
  Speech Language Pathology Treatment: Dysphagia  Patient Details Name: [name] [name] MRN: [id number] DOB: July 25, 1932 Today's Date: 11/12/2016 Time: 1030-1105 SLP Time Calculation (min) (ACUTE ONLY): 35 min  Assessment / Plan / Recommendation Clinical Impression  Pt seen today for trial upgrade of po diet consistency. Pt has been tolerating her current Dysphagia level 2 diet w/ Nectar liquids w/ no overt s/s of aspiration reported by Staff. Pt positioned upright in bed given support; she exhibits min decline in overall Cognitive status requiring min verbal cues for follow through; pt is [facility] and wears aids.  Pt consumed po trials of Thin liquids via Cup following general aspiration precautions(small, single sips slowly). No overt coughing or throat clearing noted when drinking water/thins via cup; no decline in vocal quality or respiratory status post trials. Oral phase c/b adequate bolus management w/ trials. After ~4-5 ozs, she stated she "had had enough". Discussed w/ pt following general aspiration precautions including small, single sips slowly and chewing foods well(will continue to upgrade to a level 3 diet consistency as pt enjoys sandwiches - monitor meats); moistening foods; choosing easy to eat foods; and NO STRAWS when drinking. Recommend diet consistency upgrade to Mech soft(dysphagia level 3) w/ thin liquids; Pills in Puree vs water at this time; aspiration precautions. NSG updated.    HPI HPI: Pt  is a 83 y.o. female with a known history of Coronary artery disease, COPD, diabetes mellitus, uterine cancer, hyperlipidemia, irritable bowel syndrome, sleep apnea, osteoporosis, hypothyroidism, CVA presented to the emergency room with confusion and change in mental status. Patient was evaluated in the emergency room her blood sugar was very elevated. Patient lives with her daughter at home. Patient is not oriented to time place and person unable to give any history. She is dry and dehydrated.  Her blood sugar in the emergency room was more than thousand milligrams per deciliter. Beta hydroxybutyrate test was pending. Patient was severely acidotic on ABG. Her CO2 was less than 7 and creatinine was 2.86. Bedside lactate level was also elevated. Patient was given 2 L of IV normal saline bolus in the emergency room and started on IV insulin drip protocol. Case was discussed with intensivist attending on call who recommended IV insulin drip and IV fluid hydration and admission to the ICU. Pt is currently improving and stated she feels "better now" - "I'm not going yet!". She has been tolerating a Dysphagia level 2 diet w/ Nectar liquids.       SLP Plan  Continue with current plan of care       Recommendations  Diet recommendations: Dysphagia 3 (mechanical soft);Thin liquid Liquids provided via: Cup;No straw Medication Administration: Whole meds with puree Supervision: Patient able to self feed;Intermittent supervision to cue for compensatory strategies (tray setup) Compensations: Minimize environmental distractions;Slow rate;Small sips/bites;Lingual sweep for clearance of pocketing;Follow solids with liquid Postural Changes and/or Swallow Maneuvers: Seated upright 90 degrees;Upright 30-60 min after meal                General recommendations:  (Dietician f/u ) Oral Care Recommendations: Oral care BID;Staff/trained caregiver to provide oral care Follow up Recommendations: None (TBD) SLP Visit Diagnosis: Dysphagia, oropharyngeal phase (R13.12) Plan: Continue with current plan of care       GO               Orinda Kenner, [place], CCC-SLP Shawn Carattini 11/12/2016, 12:33 PM

## 2016-11-12 NOTE — Clinical Social Work Note (Signed)
Pt is ready for discharge today. CSW met with pt to provide bed offers. Pt chose [facility]. CSW updated pt's daughter, who is in agreement with discharge plan. CSW updated [facility], who is able to to accept pt today. CSW sent discharge information to facility via the HUB. CSW updated RN. CSW willc continue to follow.    , MSW, LCSW  Clinicial Social Worker [phone] 

## 2016-11-13 LAB — BASIC METABOLIC PANEL
ANION GAP: 5 (ref 5–15)
BUN: 11 mg/dL (ref 6–20)
CHLORIDE: 101 mmol/L (ref 101–111)
CO2: 28 mmol/L (ref 22–32)
Calcium: 8.3 mg/dL — ABNORMAL LOW (ref 8.9–10.3)
Creatinine, Ser: 0.97 mg/dL (ref 0.44–1.00)
GFR calc non Af Amer: 53 mL/min — ABNORMAL LOW (ref >60)
GLUCOSE: 271 mg/dL — AB (ref 65–99)
Potassium: 4.4 mmol/L (ref 3.5–5.1)
Sodium: 134 mmol/L — ABNORMAL LOW (ref 135–145)

## 2016-11-13 LAB — GLUCOSE, CAPILLARY
GLUCOSE-CAPILLARY: 220 mg/dL — AB (ref 65–99)
GLUCOSE-CAPILLARY: 351 mg/dL — AB (ref 65–99)
Glucose-Capillary: 342 mg/dL — ABNORMAL HIGH (ref 65–99)
Glucose-Capillary: 140 mg/dL — ABNORMAL HIGH (ref 65–99)

## 2016-11-13 LAB — MAGNESIUM: Magnesium: 1.7 mg/dL (ref 1.7–2.4)

## 2016-11-13 LAB — PHOSPHORUS: PHOSPHORUS: 2.7 mg/dL (ref 2.5–4.6)

## 2016-11-13 LAB — PHOSPHOLIPIDS, SERUM: PHOSPHOLIPIDS: 235 mg/dL (ref 150–250)

## 2016-11-13 MED ORDER — POLYETHYLENE GLYCOL 3350 17 G PO PACK
17.0000 g | PACK | Freq: Every day | ORAL | Status: DC
  Administered 2016-11-13 – 2016-11-14 (×2): 17 g via ORAL
  Filled 2016-11-13 (×2): qty 1

## 2016-11-13 MED ORDER — GLUCERNA SHAKE PO LIQD: 237.0000 mL | Freq: Two times a day (BID) | ORAL | 0 refills | Status: AC

## 2016-11-13 MED ORDER — BISACODYL 10 MG RE SUPP
10.0000 mg | Freq: Every day | RECTAL | Status: DC | PRN
  Administered 2016-11-13: 10 mg via RECTAL
  Filled 2016-11-13: qty 1

## 2016-11-13 MED ORDER — DOCUSATE SODIUM 100 MG PO CAPS
100.0000 mg | ORAL_CAPSULE | Freq: Two times a day (BID) | ORAL | Status: DC
  Administered 2016-11-13 – 2016-11-14 (×3): 100 mg via ORAL
  Filled 2016-11-13 (×3): qty 1

## 2016-11-13 NOTE — Care Management (Signed)
Information for medicare appeal to be faxed to [employer] this morning by Colan Neptune

## 2016-11-13 NOTE — Progress Notes (Signed)
[facility] at [place] NAME: [name] [name]    MR#:  [id number]  DATE OF BIRTH:  12-Apr-1932  SUBJECTIVE:   Patient is back to baseline  She is ready for discharge  REVIEW OF SYSTEMS:    Review of Systems  Constitutional: Negative for fever, chills weight loss HENT: Negative for ear pain, nosebleeds, congestion, facial swelling, rhinorrhea, neck pain, neck stiffness and ear discharge.   Respiratory: Negative for cough, shortness of breath, wheezing  Cardiovascular: Negative for chest pain, palpitations and leg swelling.  Gastrointestinal: Negative for heartburn, abdominal pain, vomiting, diarrhea or consitpation Genitourinary: Negative for dysuria, urgency, frequency, hematuria Musculoskeletal: Negative for back pain or joint pain Neurological: Negative for dizziness, seizures, syncope, focal weakness,  numbness and headaches.  Hematological: Does not bruise/bleed easily.  Psychiatric/Behavioral: Negative for hallucinations, confusion, dysphoric mood    Tolerating Diet:yes      DRUG ALLERGIES:   Allergies  Allergen Reactions  . Penicillins Rash    Has patient had a PCN reaction causing immediate rash, facial/tongue/throat swelling, SOB or lightheadedness with hypotension: No Has patient had a PCN reaction causing severe rash involving mucus membranes or skin necrosis: No Has patient had a PCN reaction that required hospitalization No Has patient had a PCN reaction occurring within the last 10 years: No If all of the above answers are "NO", then may proceed with Cephalosporin use.     VITALS:  Blood pressure (!) 117/31, pulse 81, temperature 98 F (36.7 C), temperature source Oral, resp. rate 18, height 5\' 5"  (1.651 m), weight 63.1 kg (139 lb 1.8 oz), SpO2 93 %.  PHYSICAL EXAMINATION:  Constitutional: Appears well-developed and well-nourished. No distress. HENT: Normocephalic. Marland Kitchen Oropharynx is clear and moist.  Eyes:  Conjunctivae and EOM are normal. PERRLA, no scleral icterus.  Neck: Normal ROM. Neck supple. No JVD. No tracheal deviation. CVS: RRR, S1/S2 +, no murmurs, no gallops, no carotid bruit.  Pulmonary: Effort and breath sounds normal, no stridor, rhonchi, wheezes, rales.  Abdominal: Soft. BS +,  no distension, tenderness, rebound or guarding.  Musculoskeletal: Normal range of motion. No edema and no tenderness.  Neuro: Alert. CN 2-12 grossly intact. No focal deficits. Skin: Skin is warm and dry. No rash noted. Psychiatric: Normal mood and affect.      LABORATORY PANEL:   CBC  Recent Labs Lab 11/08/16 0645  11/11/16 1729  WBC 8.2  --   --   HGB 10.1*  < > 9.6*  HCT 29.4*  --   --   PLT 161  --   --   < > = values in this interval not displayed. ------------------------------------------------------------------------------------------------------------------  Chemistries   Recent Labs Lab 11/13/16 0447  NA 134*  K 4.4  CL 101  CO2 28  GLUCOSE 271*  BUN 11  CREATININE 0.97  CALCIUM 8.3*  MG 1.7   ------------------------------------------------------------------------------------------------------------------  Cardiac Enzymes No results for input(s): TROPONINI in the last 168 hours. ------------------------------------------------------------------------------------------------------------------  RADIOLOGY:  Ct Angio Chest Pe W Or Wo Contrast  Result Date: 11/11/2016 CLINICAL DATA:  Hemoptysis earlier today. Diminished breath sounds bilaterally. Altered mental status. EXAM: CT ANGIOGRAPHY CHEST WITH CONTRAST TECHNIQUE: Multidetector CT imaging of the chest was performed using the standard protocol during bolus administration of intravenous contrast. Multiplanar CT image reconstructions and MIPs were obtained to evaluate the vascular anatomy. CONTRAST:  75 mL Isovue 370 IV. COMPARISON:  CT 06/08/2015 and 06/16/2011 FINDINGS: Cardiovascular: Heart size is within normal.  There is calcified  plaque over the left anterior descending coronary artery. Mild calcified plaque over the thoracic aorta. Pulmonary arterial system is within normal without evidence of emboli. Mediastinum/Nodes: No evidence of mediastinal or hilar adenopathy. Small hiatal hernia. Lungs/Pleura: Lungs are adequately inflated and demonstrate a small right pleural effusion and tiny amount of left pleural fluid. Mild associated bibasilar atelectasis. Airways are within normal. Upper Abdomen: Calcified plaque over the abdominal aorta. Musculoskeletal: Mild degenerate change of the spine. Review of the MIP images confirms the above findings. IMPRESSION: No evidence of pulmonary embolism. Small right-sided pleural effusion and tiny amount of left pleural fluid with mild bibasilar atelectasis. Mild atherosclerotic coronary artery disease. Aortic atherosclerosis. Electronically Signed   By: Marin Olp M.D.   On: 11/11/2016 20:06     ASSESSMENT AND PLAN:   83 year old female with a history of diabetes and COPD who presented with change in mental status and was found to have DKA.  1. Acute encephalopathy in the setting of DKA: Patient's mental status is now baseline.  2. DKA: Patient was initially placed on DKA protocol. This is resolved. She will continue on ADA diet with metformin and long-acting insulin. She'll follow-up with her endocrinologist.  3.HCAP: Patient had one episode of hemoptysis. Chest x-ray showed pneumonia. CT scan was negative for pulmonary emboli. She will be discharged on Levaquin.  4. Acute kidney injury due to DKA: This is resolved  5. Coag-negative staph bacteremia: ID was consulted regarding abnormal blood cultures. Dr. Ola Spurr recommended obtaining echocardiogram and continue vancomycin while in the hospital. Patient will be discharged on doxycycline to complete a two-week course. ECHO Study Conclusions  - Procedure narrative: Transthoracic echocardiography. The  study was technically difficult, as a result of restricted patient mobility. - Left ventricle: The cavity size was normal. Systolic function was normal. The estimated ejection fraction was in the range of 50% to 55%. - Aortic valve: There was trivial regurgitation. Valve area (Vmax): 2.76 cm^2.  6. Hypothyroid: Patient will continue on Synthroid 100 g daily  7. Acute on chronic blood loss anemia: Hemoglobin is stable  8. History CVA: Patient will continue on aspirin, statin and Plavix      Management plans discussed with the patient and she is in agreement.  CODE STATUS: DNR  TOTAL TIME TAKING CARE OF THIS PATIENT: 27 minutes.     POSSIBLE D/C today, DEPENDING ON CLINICAL CONDITION.   Cyndy Braver M.D on 11/13/2016 at 10:36 AM  Between 7am to 6pm - Pager - [phone] After 6pm go to [email or web address] - password [email or web address] [facility]  Office  [phone]  CC: Primary care physician; [name], DO  Note: This dictation was prepared with Dragon dictation along with smaller phrase technology. Any transcriptional errors that result from this process are unintentional.

## 2016-11-13 NOTE — Progress Notes (Signed)
  Speech Language Pathology Treatment: Dysphagia  Patient Details Name: [name] [name] MRN: [id number] DOB: November 07, 1931 Today's Date: 11/13/2016 Time: 9243-8365 SLP Time Calculation (min) (ACUTE ONLY): 45 min  Assessment / Plan / Recommendation Clinical Impression  Pt seen today for toleration of diet after diet upgrade yesterday to mech soft w/ thin liquids. Pt and NSG indicated good toleration of diet and even Pills w/ water; no overt s/s of aspiration reported by Staff.  Pt finishing some of her breakfast meal this morning and asked for more coffee. She consumed po trials of Thin liquids via Cup following general aspiration precautions(small, single sips slowly). No overt coughing or throat clearing noted when drinking thins via cup; no decline in vocal quality or respiratory status post trials. Oral phase c/b adequate bolus management w/ trials of mech soft foods as well. Discussed w/ pt the need to follow general aspiration precautions including small, single sips slowly and chewing foods well; moistening foods; choosing easy to eat foods; and NO STRAWS when drinking at this time. Recommend continue w/ current Mech Soft diet consistency w/ thin liquids; Pills in Puree vs water if any difficulty swallowing and clearing; aspiration precautions. No further skilled ST Services indicated at this time as pt appears at her baseline for swallowing. NSG updated and will reconsult if needed.     HPI HPI: Pt  is a 83 y.o. female with a known history of Coronary artery disease, COPD, diabetes mellitus, uterine cancer, hyperlipidemia, irritable bowel syndrome, sleep apnea, osteoporosis, hypothyroidism, CVA presented to the emergency room with confusion and change in mental status. Patient was evaluated in the emergency room her blood sugar was very elevated. Patient lives with her daughter at home. Patient is not oriented to time place and person unable to give any history. She is dry and dehydrated. Her blood  sugar in the emergency room was more than thousand milligrams per deciliter. Beta hydroxybutyrate test was pending. Patient was severely acidotic on ABG. Her CO2 was less than 7 and creatinine was 2.86. Bedside lactate level was also elevated. Patient was given 2 L of IV normal saline bolus in the emergency room and started on IV insulin drip protocol. Case was discussed with intensivist attending on call who recommended IV insulin drip and IV fluid hydration and admission to the ICU. Pt is currently improving and stated she feels "better now" - "I'm not going yet!". She has been tolerating a Dysphagia level 3 diet w/ thin liquids since yesterday post tx session.      SLP Plan  All goals met       Recommendations  Diet recommendations: Dysphagia 3 (mechanical soft);Thin liquid (for easier cutting of foods, eating) Liquids provided via: Cup;No straw Medication Administration: Whole meds with puree (as needed for easier, safer swallowing) Supervision: Patient able to self feed;Intermittent supervision to cue for compensatory strategies Compensations: Minimize environmental distractions;Slow rate;Small sips/bites;Lingual sweep for clearance of pocketing;Follow solids with liquid Postural Changes and/or Swallow Maneuvers: Seated upright 90 degrees;Upright 30-60 min after meal                General recommendations:  (Dietician f/u as needed) Oral Care Recommendations: Oral care BID;Staff/trained caregiver to provide oral care Follow up Recommendations: None SLP Visit Diagnosis: Dysphagia, oropharyngeal phase (R13.12) Plan: All goals met       GO                 Orinda Kenner, MS, CCC-SLP [name],[name] 11/13/2016, 1:54 PM

## 2016-11-13 NOTE — Progress Notes (Addendum)
[facility] NAME: [name] [name]    MR#:  [id number]  DATE OF BIRTH:  April 13, 1932  SUBJECTIVE:   Patient is back to baseline  She is ready for discharge her daughter appealed but According to case management patient cannot be discharged today until appeal issue has resolved  She is upset that her daughter appealed her discharge  REVIEW OF SYSTEMS:    Review of Systems  Constitutional: Negative for fever, chills weight loss HENT: Negative for ear pain, nosebleeds, congestion, facial swelling, rhinorrhea, neck pain, neck stiffness and ear discharge.   Respiratory: Negative for cough, shortness of breath, wheezing  Cardiovascular: Negative for chest pain, palpitations and leg swelling.  Gastrointestinal: Negative for heartburn, abdominal pain, vomiting, diarrhea or consitpation Genitourinary: Negative for dysuria, urgency, frequency, hematuria Musculoskeletal: Negative for back pain or joint pain Neurological: Negative for dizziness, seizures, syncope, focal weakness,  numbness and headaches.  Hematological: Does not bruise/bleed easily.  Psychiatric/Behavioral: Negative for hallucinations, confusion, dysphoric mood    Tolerating Diet:yes      DRUG ALLERGIES:   Allergies  Allergen Reactions  . Penicillins Rash    Has patient had a PCN reaction causing immediate rash, facial/tongue/throat swelling, SOB or lightheadedness with hypotension: No Has patient had a PCN reaction causing severe rash involving mucus membranes or skin necrosis: No Has patient had a PCN reaction that required hospitalization No Has patient had a PCN reaction occurring within the last 10 years: No If all of the above answers are "NO", then may proceed with Cephalosporin use.     VITALS:  Blood pressure (!) 117/31, pulse 81, temperature 98 F (36.7 C), temperature source Oral, resp. rate 18, height 5\' 5"  (1.651 m), weight 63.1 kg (139 lb 1.8 oz), SpO2  93 %.  PHYSICAL EXAMINATION:  Constitutional: Appears well-developed and well-nourished. No distress. HENT: Normocephalic. Marland Kitchen Oropharynx is clear and moist.  Eyes: Conjunctivae and EOM are normal. PERRLA, no scleral icterus.  Neck: Normal ROM. Neck supple. No JVD. No tracheal deviation. CVS: RRR, S1/S2 +, no murmurs, no gallops, no carotid bruit.  Pulmonary: Effort and breath sounds normal, no stridor, rhonchi, wheezes, rales.  Abdominal: Soft. BS +,  no distension, tenderness, rebound or guarding.  Musculoskeletal: Normal range of motion. No edema and no tenderness.  Neuro: Alert. CN 2-12 grossly intact. No focal deficits. Skin: Skin is warm and dry. No rash noted. Psychiatric: Normal mood and affect.      LABORATORY PANEL:   CBC  Recent Labs Lab 11/08/16 0645  11/11/16 1729  WBC 8.2  --   --   HGB 10.1*  < > 9.6*  HCT 29.4*  --   --   PLT 161  --   --   < > = values in this interval not displayed. ------------------------------------------------------------------------------------------------------------------  Chemistries   Recent Labs Lab 11/13/16 0447  NA 134*  K 4.4  CL 101  CO2 28  GLUCOSE 271*  BUN 11  CREATININE 0.97  CALCIUM 8.3*  MG 1.7   ------------------------------------------------------------------------------------------------------------------  Cardiac Enzymes No results for input(s): TROPONINI in the last 168 hours. ------------------------------------------------------------------------------------------------------------------  RADIOLOGY:  Ct Angio Chest Pe W Or Wo Contrast  Result Date: 11/11/2016 CLINICAL DATA:  Hemoptysis earlier today. Diminished breath sounds bilaterally. Altered mental status. EXAM: CT ANGIOGRAPHY CHEST WITH CONTRAST TECHNIQUE: Multidetector CT imaging of the chest was performed using the standard protocol during bolus administration of intravenous contrast. Multiplanar CT image reconstructions and MIPs were obtained  to evaluate the vascular anatomy. CONTRAST:  75 mL Isovue 370 IV. COMPARISON:  CT 06/08/2015 and 06/16/2011 FINDINGS: Cardiovascular: Heart size is within normal. There is calcified plaque over the left anterior descending coronary artery. Mild calcified plaque over the thoracic aorta. Pulmonary arterial system is within normal without evidence of emboli. Mediastinum/Nodes: No evidence of mediastinal or hilar adenopathy. Small hiatal hernia. Lungs/Pleura: Lungs are adequately inflated and demonstrate a small right pleural effusion and tiny amount of left pleural fluid. Mild associated bibasilar atelectasis. Airways are within normal. Upper Abdomen: Calcified plaque over the abdominal aorta. Musculoskeletal: Mild degenerate change of the spine. Review of the MIP images confirms the above findings. IMPRESSION: No evidence of pulmonary embolism. Small right-sided pleural effusion and tiny amount of left pleural fluid with mild bibasilar atelectasis. Mild atherosclerotic coronary artery disease. Aortic atherosclerosis. Electronically Signed   By: Marin Olp M.D.   On: 11/11/2016 20:06     ASSESSMENT AND PLAN:   83 year old female with a history of diabetes and COPD who presented with change in mental status and was found to have DKA.  1. Acute encephalopathy in the setting of DKA: Patient's mental status is now baseline.  2. DKA: Patient was initially placed on DKA protocol. This is resolved. She will continue on ADA diet with metformin and long-acting insulin. She'll follow-up with her endocrinologist.  3.HCAP: Patient had one episode of hemoptysis. Chest x-ray showed pneumonia. CT scan was negative for pulmonary emboli. She will be discharged on Levaquin.  4. Acute kidney injury due to DKA: This is resolved  5. Coag-negative staph bacteremia: ID was consulted regarding abnormal blood cultures. Dr. Ola Spurr recommended obtaining echocardiogram and continue vancomycin while in the hospital.  Patient will be discharged on doxycycline to complete a two-week course. ECHO Study Conclusions  - Procedure narrative: Transthoracic echocardiography. The study was technically difficult, as a result of restricted patient mobility. - Left ventricle: The cavity size was normal. Systolic function was normal. The estimated ejection fraction was in the range of 50% to 55%. - Aortic valve: There was trivial regurgitation. Valve area (Vmax): 2.76 cm^2.  6. Hypothyroid: Patient will continue on Synthroid 100 g daily  7. Acute on chronic blood loss anemia: Hemoglobin is stable  8. History CVA: Patient will continue on aspirin, statin and Plavix      Management plans discussed with the patient and she is in agreement.  CODE STATUS: DNR  TOTAL TIME TAKING CARE OF THIS PATIENT: 27 minutes.     POSSIBLE D/C today, DEPENDING on appeal process Agam Davenport M.D on 11/13/2016 at 11:16 AM  Between 7am to 6pm - Pager - [phone] After 6pm go to [email or web address] - password [email or web address] [facility]  Office  [phone]  CC: Primary care physician; [name], DO  Note: This dictation was prepared with Dragon dictation along with smaller phrase technology. Any transcriptional errors that result from this process are unintentional.

## 2016-11-13 NOTE — Care Management Important Message (Signed)
Important Message  Patient Details  Name: [name] [name] MRN: [id number] Date of Birth: [birth year]   Medicare Important Message Given:  Yes Given and signed by daughter on 4/18 when daughter appealed the discharge per unit CM notes   Katrina Stack, RN 11/13/2016, 8:40 AM

## 2016-11-13 NOTE — Progress Notes (Signed)
Inpatient Diabetes Program Recommendations  AACE/ADA: New Consensus Statement on Inpatient Glycemic Control (2015)  Target Ranges:  Prepandial:   less than 140 mg/dL      Peak postprandial:   less than 180 mg/dL (1-2 hours)      Critically ill patients:  140 - 180 mg/dL   Lab Results  Component Value Date   GLUCAP 342 (H) 11/13/2016   HGBA1C 10.2 (H) 11/07/2016    Review of Glycemic Control   Results for DEMESHA, BOORMAN (MRN [id number]) as of 11/13/2016 08:00  Ref. Range 11/12/2016 07:28 11/12/2016 12:08 11/12/2016 16:52 11/12/2016 21:26 11/13/2016 07:23  Glucose-Capillary Latest Ref Range: 65 - 99 mg/dL 295 (H) 361 (H) 245 (H) 221 (H) 342 (H)    Home DM Meds: Lantus 22 units QHS Novolog 8 units Breakfast/ 10 units Lunch/ 12 units Dinner Novolog SSI for high CBGs Metformin 500 mg BID  Current Insulin Orders:Novolog Moderate Correction Scale/ SSI (0-15 units) tid and Novolog 0-5 units qhs, Lantus 12 units qday   Consider increasing Lantus to 13 units qday  Gentry Fitz, RN, [place], [place], CDE Diabetes Coordinator Inpatient Diabetes Program  [phone] (Team Pager) [phone] ([place]) 11/13/2016 8:01 AM

## 2016-11-13 NOTE — Plan of Care (Signed)
Problem: Bowel/Gastric: Goal: Will not experience complications related to bowel motility Outcome: Progressing Pt had large BM today

## 2016-11-13 NOTE — Progress Notes (Signed)
MEDICATION RELATED CONSULT NOTE - INITIAL   Pharmacy Consult for Electrolyte management Indication: hypomagnesemia, hypophosphatemia  Allergies  Allergen Reactions  . Penicillins Rash    Has patient had a PCN reaction causing immediate rash, facial/tongue/throat swelling, SOB or lightheadedness with hypotension: No Has patient had a PCN reaction causing severe rash involving mucus membranes or skin necrosis: No Has patient had a PCN reaction that required hospitalization No Has patient had a PCN reaction occurring within the last 10 years: No If all of the above answers are "NO", then may proceed with Cephalosporin use.     Patient Measurements: Height: 5\' 5"  (165.1 cm) Weight: 139 lb 1.8 oz (63.1 kg) IBW/kg (Calculated) : 57 Adjusted Body Weight:    Vital Signs: Temp: 98 F (36.7 C) (04/19 0523) Temp Source: Oral (04/19 0523) BP: 117/31 (04/19 0523) Pulse Rate: 81 (04/19 0523) Intake/Output from previous day: 04/18 0701 - 04/19 0700 In: 240 [P.O.:240] Out: -  Intake/Output from this shift: No intake/output data recorded.  Labs:  Recent Labs  11/11/16 1729 11/12/16 0358 11/13/16 0447  HGB 9.6*  --   --   CREATININE  --  0.86 0.97  MG  --   --  1.7  PHOS  --   --  2.7    Lab Results  Component Value Date   K 4.4 11/13/2016   Estimated Creatinine Clearance: 40.2 mL/min (by C-G formula based on SCr of 0.97 mg/dL).   Medical History: Past Medical History:  Diagnosis Date  . Anxiety state, unspecified   . CAD (coronary artery disease)   . Cancer [facility])    2004 Right breast, found on mammogram, radiation therapy, Dr. Bryson Ha, uterine cancer,   . COPD (chronic obstructive pulmonary disease) ([place])   . Diabetes mellitus   . Esophageal reflux   . Heart burn   . Hyperlipidemia   . IBS (irritable bowel syndrome)   . Myocardial infarction [facility])    Cath negative except for 40% occlusion LAD.  Pt not candidate for betablocker or ACEI because of hypotension  . OSA  (obstructive sleep apnea)   . Osteoporosis 02/21/09   DEXA scan showed osteoporosis with left femur T-score -2.8.  Marland Kitchen Presbyacusis   . Stroke ([place])   . Thyroid disease    Hypothyroid  . Vitamin D deficiency     Medications:  Scheduled:  . atorvastatin  80 mg Oral q1800  . chlorpheniramine-HYDROcodone  5 mL Oral Q12H  . docusate sodium  100 mg Oral BID  . enoxaparin (LOVENOX) injection  40 mg Subcutaneous Q24H  . feeding supplement (GLUCERNA SHAKE)  237 mL Oral BID BM  . insulin aspart  0-15 Units Subcutaneous TID WC  . insulin aspart  0-5 Units Subcutaneous QHS  . insulin glargine  12 Units Subcutaneous Daily  . levothyroxine  100 mcg Oral QAC breakfast  . mouth rinse  15 mL Mouth Rinse BID  . pantoprazole sodium  40 mg Per Tube BID  . polyethylene glycol  17 g Oral Daily   Infusions:  . 0.9 % NaCl with KCl 20 mEq / L 50 mL/hr at 11/12/16 2135  . ceFEPIme (MAXIPIME) 2 GM IVP Stopped (11/12/16 2132)  . vancomycin      Assessment: 83 yo F admitted with DKA, upper GI bleed, AKI.  Plan:  4/15: K 3.1, Mag 1.9, Phos 2.1, Scr 0.76 Patient is on D5W w/ 20 meq KCL at 50 ml/hr. Will order Potassium Phosphate 15 mmol IV x 1. Will f/u with  am labs.  4/18 0358 K 4.3, Ca 8.3. Patient remains on NS with 20 mEq/L @ 50 mL/hr to provide approximately 24 mEq potassium per day. No further supplement at this time. Will recheck all electrolytes tomorrow with AM labs.  4/19 K 4.4, Ca 8.3, Mg 1.7, phos 2.7. Patient remains on NS with 20 mEq/L @ 50 mL/hr. No further supplement at this time. Will recheck all electrolytes tomorrow with AM labs.  Arriyana Rodell A. Jordan Hawks, PharmD, BCPS Clinical Pharmacist 11/13/2016 7:28 AM

## 2016-11-14 ENCOUNTER — Encounter
Admission: RE | Admit: 2016-11-14 | Discharge: 2016-11-14 | Disposition: A | Discharge status: Home / Self Care | Payer: Medicare Other | Source: Ambulatory Visit | Attending: Internal Medicine | Admitting: Internal Medicine

## 2016-11-14 DIAGNOSIS — Z7902 Long term (current) use of antithrombotics/antiplatelets: Secondary | ICD-10-CM | POA: Diagnosis not present

## 2016-11-14 DIAGNOSIS — E162 Hypoglycemia, unspecified: Secondary | ICD-10-CM | POA: Diagnosis not present

## 2016-11-14 DIAGNOSIS — Z79899 Other long term (current) drug therapy: Secondary | ICD-10-CM | POA: Diagnosis not present

## 2016-11-14 DIAGNOSIS — G9340 Encephalopathy, unspecified: Secondary | ICD-10-CM | POA: Diagnosis not present

## 2016-11-14 DIAGNOSIS — H9110 Presbycusis, unspecified ear: Secondary | ICD-10-CM | POA: Diagnosis not present

## 2016-11-14 DIAGNOSIS — J189 Pneumonia, unspecified organism: Secondary | ICD-10-CM | POA: Insufficient documentation

## 2016-11-14 DIAGNOSIS — N183 Chronic kidney disease, stage 3 (moderate): Secondary | ICD-10-CM | POA: Diagnosis not present

## 2016-11-14 DIAGNOSIS — E1165 Type 2 diabetes mellitus with hyperglycemia: Secondary | ICD-10-CM | POA: Diagnosis not present

## 2016-11-14 DIAGNOSIS — Z7982 Long term (current) use of aspirin: Secondary | ICD-10-CM | POA: Diagnosis not present

## 2016-11-14 DIAGNOSIS — B957 Other staphylococcus as the cause of diseases classified elsewhere: Secondary | ICD-10-CM | POA: Diagnosis not present

## 2016-11-14 DIAGNOSIS — E1310 Other specified diabetes mellitus with ketoacidosis without coma: Secondary | ICD-10-CM | POA: Diagnosis not present

## 2016-11-14 DIAGNOSIS — E1122 Type 2 diabetes mellitus with diabetic chronic kidney disease: Secondary | ICD-10-CM | POA: Diagnosis not present

## 2016-11-14 DIAGNOSIS — I252 Old myocardial infarction: Secondary | ICD-10-CM | POA: Diagnosis not present

## 2016-11-14 DIAGNOSIS — E118 Type 2 diabetes mellitus with unspecified complications: Secondary | ICD-10-CM | POA: Diagnosis not present

## 2016-11-14 DIAGNOSIS — R1312 Dysphagia, oropharyngeal phase: Secondary | ICD-10-CM | POA: Diagnosis not present

## 2016-11-14 DIAGNOSIS — E1151 Type 2 diabetes mellitus with diabetic peripheral angiopathy without gangrene: Secondary | ICD-10-CM | POA: Diagnosis not present

## 2016-11-14 DIAGNOSIS — R7881 Bacteremia: Secondary | ICD-10-CM | POA: Diagnosis not present

## 2016-11-14 DIAGNOSIS — J309 Allergic rhinitis, unspecified: Secondary | ICD-10-CM | POA: Diagnosis not present

## 2016-11-14 DIAGNOSIS — Z8673 Personal history of transient ischemic attack (TIA), and cerebral infarction without residual deficits: Secondary | ICD-10-CM | POA: Diagnosis not present

## 2016-11-14 DIAGNOSIS — R262 Difficulty in walking, not elsewhere classified: Secondary | ICD-10-CM | POA: Diagnosis not present

## 2016-11-14 DIAGNOSIS — M6281 Muscle weakness (generalized): Secondary | ICD-10-CM | POA: Diagnosis not present

## 2016-11-14 DIAGNOSIS — E785 Hyperlipidemia, unspecified: Secondary | ICD-10-CM | POA: Diagnosis not present

## 2016-11-14 DIAGNOSIS — E039 Hypothyroidism, unspecified: Secondary | ICD-10-CM | POA: Diagnosis not present

## 2016-11-14 DIAGNOSIS — N179 Acute kidney failure, unspecified: Secondary | ICD-10-CM | POA: Diagnosis not present

## 2016-11-14 DIAGNOSIS — E119 Type 2 diabetes mellitus without complications: Secondary | ICD-10-CM | POA: Diagnosis not present

## 2016-11-14 DIAGNOSIS — Z794 Long term (current) use of insulin: Secondary | ICD-10-CM | POA: Diagnosis not present

## 2016-11-14 DIAGNOSIS — D649 Anemia, unspecified: Secondary | ICD-10-CM | POA: Diagnosis not present

## 2016-11-14 DIAGNOSIS — F0150 Vascular dementia without behavioral disturbance: Secondary | ICD-10-CM | POA: Diagnosis not present

## 2016-11-14 DIAGNOSIS — K219 Gastro-esophageal reflux disease without esophagitis: Secondary | ICD-10-CM | POA: Diagnosis not present

## 2016-11-14 DIAGNOSIS — I2510 Atherosclerotic heart disease of native coronary artery without angina pectoris: Secondary | ICD-10-CM | POA: Diagnosis not present

## 2016-11-14 DIAGNOSIS — M810 Age-related osteoporosis without current pathological fracture: Secondary | ICD-10-CM | POA: Diagnosis not present

## 2016-11-14 DIAGNOSIS — R739 Hyperglycemia, unspecified: Secondary | ICD-10-CM | POA: Diagnosis not present

## 2016-11-14 DIAGNOSIS — Z743 Need for continuous supervision: Secondary | ICD-10-CM | POA: Diagnosis not present

## 2016-11-14 LAB — BASIC METABOLIC PANEL
Anion gap: 6 (ref 5–15)
BUN: 9 mg/dL (ref 6–20)
CHLORIDE: 97 mmol/L — AB (ref 101–111)
CO2: 29 mmol/L (ref 22–32)
CREATININE: 0.88 mg/dL (ref 0.44–1.00)
Calcium: 8.3 mg/dL — ABNORMAL LOW (ref 8.9–10.3)
GFR calc Af Amer: >60 mL/min (ref >60)
GFR calc non Af Amer: 60 mL/min — ABNORMAL LOW (ref >60)
Glucose, Bld: 268 mg/dL — ABNORMAL HIGH (ref 65–99)
POTASSIUM: 4.6 mmol/L (ref 3.5–5.1)
SODIUM: 132 mmol/L — AB (ref 135–145)

## 2016-11-14 LAB — GLUCOSE, CAPILLARY
GLUCOSE-CAPILLARY: 236 mg/dL — AB (ref 65–99)
GLUCOSE-CAPILLARY: 337 mg/dL — AB (ref 65–99)
GLUCOSE-CAPILLARY: 252 mg/dL — AB (ref 65–99)
Glucose-Capillary: 259 mg/dL — ABNORMAL HIGH (ref 65–99)

## 2016-11-14 LAB — MAGNESIUM: MAGNESIUM: 1.7 mg/dL (ref 1.7–2.4)

## 2016-11-14 LAB — PHOSPHORUS: Phosphorus: 3.3 mg/dL (ref 2.5–4.6)

## 2016-11-14 MED ORDER — INSULIN GLARGINE 100 UNIT/ML ~~LOC~~ SOLN
14.0000 [IU] | Freq: Every day | SUBCUTANEOUS | Status: DC
  Filled 2016-11-14: qty 0.14

## 2016-11-14 MED ORDER — INSULIN GLARGINE 100 UNIT/ML ~~LOC~~ SOLN: 14.0000 [IU] | Freq: Every day | SUBCUTANEOUS | 11 refills | Status: DC

## 2016-11-14 MED ORDER — DOXYCYCLINE HYCLATE 100 MG PO TABS
100.0000 mg | ORAL_TABLET | Freq: Two times a day (BID) | ORAL | Status: DC
  Filled 2016-11-14: qty 1

## 2016-11-14 NOTE — Care Management (Signed)
Judithann Graves upheld the discharge.  CM called patient's daughter and left voicemail of decision and to contact CM to further discuss discharge.  CM updated CSW of decision and that CM was calling daughter

## 2016-11-14 NOTE — Care Management (Addendum)
Informed that patient's daughter wishes to abort the appeal process.  Spoke with daughter and she says "I might as well stop it if it means that all of the stresses will stop."  After discussion with Ms Detzel and her feelings on the reason for appeal- "I did not think my mother was ready I wanted someone else to look at it " - informed her will have the appeal followed to completion.  Informed by Judithann Graves it would be completed today.  Instructed her if discharge was upheld, patient could discharge today but would be held liable to charges after 12PM 4/21. UPdated CSW

## 2016-11-14 NOTE — Clinical Social Work Placement (Signed)
   CLINICAL SOCIAL WORK PLACEMENT  NOTE  Date:  11/14/2016  Patient Details  Name: [name] [name] MRN: [id number] Date of Birth: 05/17/1932  Clinical Social Work is seeking post-discharge placement for this patient at the [place] level of care (*CSW will initial, date and re-position this form in  chart as items are completed):  Yes   Patient/family provided with [facility] Work Department's list of facilities offering this level of care within the geographic area requested by the patient (or if unable, by the patient's family).  Yes   Patient/family informed of their freedom to choose among providers that offer the needed level of care, that participate in Medicare, Medicaid or managed care program needed by the patient, have an available bed and are willing to accept the patient.  Yes   Patient/family informed of [facility]'s ownership interest in [facility] and [facility], as well as of the fact that they are under no obligation to receive care at these facilities.  PASRR submitted to EDS on       PASRR number received on       Existing PASRR number confirmed on 11/11/16     FL2 transmitted to all facilities in geographic area requested by pt/family on 11/11/16     FL2 transmitted to all facilities within larger geographic area on       Patient informed that his/her managed care company has contracts with or will negotiate with certain facilities, including the following:        Yes   Patient/family informed of bed offers received.  Patient chooses bed at [facility]     Physician recommends and patient chooses bed at      Patient to be transferred to [facility] on 11/14/16.  Patient to be transferred to facility by [facility]     Patient family notified on 11/14/16 of transfer.  Name of family member notified:  Pt's daughter, [name]     PHYSICIAN       Additional Comment:     _______________________________________________ Darden Dates, LCSW 11/14/2016, 2:18 PM

## 2016-11-14 NOTE — Progress Notes (Signed)
Pt left the unit at 2050 via EMS to [facility]. Spoke with son [name] to inform him of transfer. [name] reported he will pass on information to sister [name]. Pt showed no signs of distress at the time of discharge.

## 2016-11-14 NOTE — Clinical Social Work Note (Signed)
Pt is ready for discharge today. Pt's daughter has chosen [facility] as a bed is now available. Facility is ready to admit pt as they have received discharge information. Pt's daughter is aware and agreeable to discharge plan. RN will call report. [facility]. CSW is signing off as no further needs identified.   Darden Dates, MSW, LCSW  Clinical Social Worker  [phone]

## 2016-11-14 NOTE — Care Management (Signed)
Spoke with daughter regarding appeal determination- discharge upheld by [employer].  [name] requests that patient discharge today to the skilled facility

## 2016-11-14 NOTE — Progress Notes (Signed)
Inpatient Diabetes Program Recommendations  AACE/ADA: New Consensus Statement on Inpatient Glycemic Control (2015)  Target Ranges:  Prepandial:   less than 140 mg/dL      Peak postprandial:   less than 180 mg/dL (1-2 hours)      Critically ill patients:  140 - 180 mg/dL   Lab Results  Component Value Date   GLUCAP 259 (H) 11/14/2016   HGBA1C 10.2 (H) 11/07/2016    Review of Glycemic Control   Results for [name] [name], [name] [name] (MRN [id number]) as of 11/14/2016 08:23  Ref. Range 11/13/2016 07:23 11/13/2016 11:21 11/13/2016 16:43 11/13/2016 20:31 11/14/2016 07:24  Glucose-Capillary Latest Ref Range: 65 - 99 mg/dL 342 (H) 220 (H) 351 (H) 140 (H) 259 (H)    Home DM Meds: Lantus 22 units QHS Novolog 8 units Breakfast/ 10 units Lunch/ 12 units Dinner Novolog SSI for high CBGs Metformin 500 mg BID  Current Insulin Orders:Novolog Moderate Correction Scale/ SSI (0-15units) tid and Novolog 0-5 units qhs, Lantus 12 units qday   Consider increasing Lantus to 14 units qday  Gentry Fitz, RN, [place], [place], CDE Diabetes Coordinator Inpatient Diabetes Program  [phone] (Team Pager) [phone] ([place]) 11/14/2016 8:23 AM

## 2016-11-14 NOTE — Progress Notes (Signed)
[facility]FECT[facility] PROGRESS NOTE Date of Admission:  11/06/2016     ID: [name] [name] is a 83 y.o. female with CNS bacteremia  Principal Problem:   Acidosis, metabolic Active Problems:   Altered mental status   Protein-calorie malnutrition, severe   Subjective: No fevers, back to baseline, more awake today   ROS  Eleven systems are reviewed and negative except per hpi  Medications:  Antibiotics Given (last 72 hours)    Date/Time Action Medication Dose Rate   11/11/16 1737 Given   vancomycin (VANCOCIN) IVPB 750 mg/150 ml premix 750 mg 150 mL/hr   11/12/16 1245 Given   vancomycin (VANCOCIN) IVPB 750 mg/150 ml premix 750 mg 150 mL/hr   11/13/16 0544 Given   vancomycin (VANCOCIN) IVPB 750 mg/150 ml premix 750 mg 150 mL/hr   11/14/16 0011 Given   vancomycin (VANCOCIN) IVPB 750 mg/150 ml premix 750 mg 150 mL/hr     . atorvastatin  80 mg Oral q1800  . chlorpheniramine-HYDROcodone  5 mL Oral Q12H  . docusate sodium  100 mg Oral BID  . enoxaparin (LOVENOX) injection  40 mg Subcutaneous Q24H  . feeding supplement (GLUCERNA SHAKE)  237 mL Oral BID BM  . insulin aspart  0-15 Units Subcutaneous TID WC  . insulin aspart  0-5 Units Subcutaneous QHS  . [START ON 11/15/2016] insulin glargine  14 Units Subcutaneous Daily  . levothyroxine  100 mcg Oral QAC breakfast  . mouth rinse  15 mL Mouth Rinse BID  . pantoprazole sodium  40 mg Per Tube BID  . polyethylene glycol  17 g Oral Daily    Objective: Vital signs in last 24 hours: Temp:  [97.9 F (36.6 C)-98.3 F (36.8 C)] 98.1 F (36.7 C) (04/20 1452) Pulse Rate:  [83-88] 88 (04/20 1452) Resp:  [16-20] 20 (04/20 1452) BP: (102-113)/(42-60) 105/56 (04/20 1452) SpO2:  [95 %-98 %] 96 % (04/20 1452) Constitutional:  Very frail, sitting up in bed, dishelved HENT: [place]/AT, PERRLA, no scleral icterus Mouth/Throat: Oropharynx is clear and dry . No oropharyngeal exudate.  Cardiovascular: Normal rate, regular rhythm and  normal heart sounds.  Pulmonary/Chest: Effort normal and breath sounds normal. No respiratory distress.  has no wheezes.  Neck = supple, no nuchal rigidity  Abdominal: Soft. Bowel sounds are normal.  exhibits no distension. There is no tenderness.  Lymphadenopathy: no cervical adenopathy. No axillary adenopathy Neurological: alert,, moves all 4  Skin: Skin is warm and dry. No rash noted. No erythema.  Psychiatric: a normal mood and affect.  behavior is normal.   Lab Results  Recent Labs  11/11/16 1729  11/13/16 0447 11/14/16 0511  HGB 9.6*  --   --   --   NA  --   < > 134* 132*  K  --   < > 4.4 4.6  CL  --   < > 101 97*  CO2  --   < > 28 29  BUN  --   < > 11 9  CREATININE  --   < > 0.97 0.88  < > = values in this interval not displayed.  Microbiology: Results for orders placed or performed during the hospital encounter of 11/06/16  Urine culture     Status: None   Collection Time: 11/06/16 12:08 AM  Result Value Ref Range Status   Specimen Description URINE, CATHETERIZED  Final   Special Requests NONE  Final   Culture   Final    NO GROWTH Performed at [facility]  [facility], [place] [address] [place], [place] [address]    Report Status 11/07/2016 FINAL  Final  Blood Culture (routine x 2)     Status: Abnormal   Collection Time: 11/06/16  1:06 AM  Result Value Ref Range Status   Specimen Description BLOOD LEFT ANTECUBITAL  Final   Special Requests Blood Culture adequate volume  Final   Culture  Setup Time   Final    GRAM POSITIVE COCCI AEROBIC BOTTLE ONLY CRITICAL VALUE NOTED.  VALUE IS CONSISTENT WITH PREVIOUSLY REPORTED AND CALLED VALUE.    Culture STAPHYLOCOCCUS SPECIES (COAGULASE NEGATIVE) (A)  Final   Report Status 11/09/2016 FINAL  Final   Organism ID, Bacteria STAPHYLOCOCCUS SPECIES (COAGULASE NEGATIVE)  Final      Susceptibility   Staphylococcus species (coagulase negative) - MIC*    CIPROFLOXACIN 4 RESISTANT Resistant     ERYTHROMYCIN >=8 RESISTANT Resistant      GENTAMICIN <=0.5 SENSITIVE Sensitive     OXACILLIN <=0.25 SENSITIVE Sensitive     TETRACYCLINE <=1 SENSITIVE Sensitive     VANCOMYCIN 1 SENSITIVE Sensitive     TRIMETH/SULFA 20 SENSITIVE Sensitive     CLINDAMYCIN RESISTANT Resistant     RIFAMPIN <=0.5 SENSITIVE Sensitive     Inducible Clindamycin POSITIVE Resistant     * STAPHYLOCOCCUS SPECIES (COAGULASE NEGATIVE)  Blood Culture (routine x 2)     Status: Abnormal   Collection Time: 11/06/16  1:06 AM  Result Value Ref Range Status   Specimen Description BLOOD RIGHT ANTECUBITAL  Final   Special Requests Blood Culture adequate volume  Final   Culture  Setup Time   Final    GRAM POSITIVE COCCI IN BOTH AEROBIC AND ANAEROBIC BOTTLES CRITICAL RESULT CALLED TO, READ BACK BY AND VERIFIED WITH: JASON ROBBINS AT 1833 ON 11/06/16 BY SNJ    Culture (A)  Final    STAPHYLOCOCCUS SPECIES (COAGULASE NEGATIVE) SUSCEPTIBILITIES PERFORMED ON PREVIOUS CULTURE WITHIN THE LAST 5 DAYS. Performed at [facility], [place] [address], [place], [place] [address]    Report Status 11/09/2016 FINAL  Final  Blood Culture ID Panel (Reflexed)     Status: Abnormal   Collection Time: 11/06/16  1:06 AM  Result Value Ref Range Status   Enterococcus species NOT DETECTED NOT DETECTED Final   Listeria monocytogenes NOT DETECTED NOT DETECTED Final   Staphylococcus species DETECTED (A) NOT DETECTED Final    Comment: Methicillin (oxacillin) resistant coagulase negative staphylococcus. Possible blood culture contaminant (unless isolated from more than one blood culture draw or clinical case suggests pathogenicity). No antibiotic treatment is indicated for blood  culture contaminants. CRITICAL RESULT CALLED TO, READ BACK BY AND VERIFIED WITH: JASON ROBBINS AT 0981 ON 11/06/16 BY SNJ    Staphylococcus aureus NOT DETECTED NOT DETECTED Final   Methicillin resistance DETECTED (A) NOT DETECTED Final    Comment: CRITICAL RESULT CALLED TO, READ BACK BY AND VERIFIED WITH: JASON  ROBBINS AT 1914 ON 11/06/16 BY SNJ    Streptococcus species NOT DETECTED NOT DETECTED Final   Streptococcus agalactiae NOT DETECTED NOT DETECTED Final   Streptococcus pneumoniae NOT DETECTED NOT DETECTED Final   Streptococcus pyogenes NOT DETECTED NOT DETECTED Final   Acinetobacter baumannii NOT DETECTED NOT DETECTED Final   Enterobacteriaceae species NOT DETECTED NOT DETECTED Final   Enterobacter cloacae complex NOT DETECTED NOT DETECTED Final   Escherichia coli NOT DETECTED NOT DETECTED Final   Klebsiella oxytoca NOT DETECTED NOT DETECTED Final   Klebsiella pneumoniae NOT DETECTED NOT DETECTED Final  Proteus species NOT DETECTED NOT DETECTED Final   Serratia marcescens NOT DETECTED NOT DETECTED Final   Haemophilus influenzae NOT DETECTED NOT DETECTED Final   Neisseria meningitidis NOT DETECTED NOT DETECTED Final   Pseudomonas aeruginosa NOT DETECTED NOT DETECTED Final   Candida albicans NOT DETECTED NOT DETECTED Final   Candida glabrata NOT DETECTED NOT DETECTED Final   Candida krusei NOT DETECTED NOT DETECTED Final   Candida parapsilosis NOT DETECTED NOT DETECTED Final   Candida tropicalis NOT DETECTED NOT DETECTED Final  MRSA PCR Screening     Status: None   Collection Time: 11/06/16  3:28 AM  Result Value Ref Range Status   MRSA by PCR NEGATIVE NEGATIVE Final    Comment:        The GeneXpert MRSA Assay (FDA approved for NASAL specimens only), is one component of a comprehensive MRSA colonization surveillance program. It is not intended to diagnose MRSA infection nor to guide or monitor treatment for MRSA infections.   CULTURE, BLOOD (ROUTINE X 2) w Reflex to ID Panel     Status: None   Collection Time: 11/07/16  2:56 PM  Result Value Ref Range Status   Specimen Description BLOOD RIGHT HAND  Final   Special Requests   Final    BOTTLES DRAWN AEROBIC AND ANAEROBIC Blood Culture results may not be optimal due to an excessive volume of blood received in culture bottles    Culture NO GROWTH 5 DAYS  Final   Report Status 11/12/2016 FINAL  Final  CULTURE, BLOOD (ROUTINE X 2) w Reflex to ID Panel     Status: None   Collection Time: 11/07/16  3:06 PM  Result Value Ref Range Status   Specimen Description BLOOD LEFT HAND  Final   Special Requests   Final    BOTTLES DRAWN AEROBIC AND ANAEROBIC Blood Culture adequate volume   Culture NO GROWTH 5 DAYS  Final   Report Status 11/12/2016 FINAL  Final    Studies/Results: No results found.  Assessment/Plan: [name] [name] is a 83 y.o. female admitted in DKA and found to have 2/2 BCX + CNS. She has recovered back to baseline.  No intravascular devices noted, no PPM or valves. TTE is neg for vegetaion.  FU bcx negative. CT neg for PNA despite iniital CXR read.  I do not think she likely has a true endovascular infection.  Suspect Benoit may have been drawn as same time at admit.  Day 10  of vanco.   Recommendations Continue vanco while inpatient but at DC can change to oral doxycycline 100 mg bid to complete 14 total days of abx therapy. Can dc cefepime Thank you very much for the consult. Will follow with you.  Lynae Pederson P   11/14/2016, 3:11 PM

## 2016-11-14 NOTE — Progress Notes (Signed)
[facility] [place] NAME: [name] [name]    MR#:  [id number]  DATE OF BIRTH:  [birth year]  SUBJECTIVE:   No acute events overnight  REVIEW OF SYSTEMS:    Review of Systems  Constitutional: Negative for fever, chills weight loss HENT: Negative for ear pain, nosebleeds, congestion, facial swelling, rhinorrhea, neck pain, neck stiffness and ear discharge.   Respiratory: Negative for cough, shortness of breath, wheezing  Cardiovascular: Negative for chest pain, palpitations and leg swelling.  Gastrointestinal: Negative for heartburn, abdominal pain, vomiting, diarrhea or consitpation Genitourinary: Negative for dysuria, urgency, frequency, hematuria Musculoskeletal: Negative for back pain or joint pain Neurological: Negative for dizziness, seizures, syncope, focal weakness,  numbness and headaches.  Hematological: Does not bruise/bleed easily.  Psychiatric/Behavioral: Negative for hallucinations, confusion, dysphoric mood    Tolerating Diet:yes      DRUG ALLERGIES:   Allergies  Allergen Reactions  . Penicillins Rash    Has patient had a PCN reaction causing immediate rash, facial/tongue/throat swelling, SOB or lightheadedness with hypotension: No Has patient had a PCN reaction causing severe rash involving mucus membranes or skin necrosis: No Has patient had a PCN reaction that required hospitalization No Has patient had a PCN reaction occurring within the last 10 years: No If all of the above answers are "NO", then may proceed with Cephalosporin use.     VITALS:  Blood pressure 113/60, pulse 85, temperature 98.3 F (36.8 C), temperature source Oral, resp. rate 16, height 5\' 5"  (1.651 m), weight 63.1 kg (139 lb 1.8 oz), SpO2 95 %.  PHYSICAL EXAMINATION:  Constitutional: Appears well-developed and well-nourished. No distress. HENT: Normocephalic. Marland Kitchen Oropharynx is clear and moist.  Eyes: Conjunctivae and EOM are normal. PERRLA, no  scleral icterus.  Neck: Normal ROM. Neck supple. No JVD. No tracheal deviation. CVS: RRR, S1/S2 +, no murmurs, no gallops, no carotid bruit.  Pulmonary: Effort and breath sounds normal, no stridor, rhonchi, wheezes, rales.  Abdominal: Soft. BS +,  no distension, tenderness, rebound or guarding.  Musculoskeletal: Normal range of motion. No edema and no tenderness.  Neuro: Alert. CN 2-12 grossly intact. No focal deficits. Skin: Skin is warm and dry. No rash noted. Psychiatric: Normal mood and affect.      LABORATORY PANEL:   CBC  Recent Labs Lab 11/08/16 0645  11/11/16 1729  WBC 8.2  --   --   HGB 10.1*  < > 9.6*  HCT 29.4*  --   --   PLT 161  --   --   < > = values in this interval not displayed. ------------------------------------------------------------------------------------------------------------------  Chemistries   Recent Labs Lab 11/14/16 0511  NA 132*  K 4.6  CL 97*  CO2 29  GLUCOSE 268*  BUN 9  CREATININE 0.88  CALCIUM 8.3*  MG 1.7   ------------------------------------------------------------------------------------------------------------------  Cardiac Enzymes No results for input(s): TROPONINI in the last 168 hours. ------------------------------------------------------------------------------------------------------------------  RADIOLOGY:  No results found.   ASSESSMENT AND PLAN:   82 year old female with a history of diabetes and COPD who presented with change in mental status and was found to have DKA.  1. Acute encephalopathy in the setting of DKA: Patient's mental status is now baseline.  2. DKA: Patient was initially placed on DKA protocol. This is resolved. She will continue on ADA diet with metformin and long-acting insulin. She'll follow-up with her endocrinologist.  3.HCAP: Patient had one episode of hemoptysis. Chest x-ray showed pneumonia. CT scan was negative for pulmonary emboli.  She will be discharged on  Levaquin.  4. Acute kidney injury due to DKA: This is resolved  5. Coag-negative staph bacteremia: ID was consulted regarding abnormal blood cultures. Dr. Ola Spurr recommended obtaining echocardiogram and continue vancomycin while in the hospital. Patient will be discharged on doxycycline to complete a two-week course. ECHO Study Conclusions  - Procedure narrative: Transthoracic echocardiography. The study was technically difficult, as a result of restricted patient mobility. - Left ventricle: The cavity size was normal. Systolic function was normal. The estimated ejection fraction was in the range of 50% to 55%. - Aortic valve: There was trivial regurgitation. Valve area (Vmax): 2.76 cm^2.  6. Hypothyroid: Patient will continue on Synthroid 100 g daily  7. Acute on chronic blood loss anemia: Hemoglobin is stable  8. History CVA: Patient will continue on aspirin, statin and Plavix  Patient ready for discharge but awaiting APPEAL resolution    Management plans discussed with the patient and she is in agreement.  CODE STATUS: DNR  TOTAL TIME TAKING CARE OF THIS PATIENT: 27 minutes.     POSSIBLE D/C today, DEPENDING on appeal process [name] [name] M.D on 11/14/2016 at 12:33 PM  Between 7am to 6pm - Pager - [phone] After 6pm go to [email or web address] - password [email or web address] [facility]  Office  [phone]  CC: Primary care physician; [name], DO  Note: This dictation was prepared with Dragon dictation along with smaller phrase technology. Any transcriptional errors that result from this process are unintentional.

## 2016-11-14 NOTE — Progress Notes (Signed)
Physical Therapy Treatment Patient Details Name: [name] WILLIAMSEN MRN: [id number] DOB: [birth year] Today's Date: 11/14/2016    History of Present Illness Pt admitted for hyperglycemia and AMS. Pt with history of CAD, COPD, DM, IBS, and CVA. Pt now diagnosed with metabolic acidosis. Pt slightly disoriented, however then able to state name, place, year.    PT Comments    Pt is making gradual progress towards goals. Pt very sleepy this date, however arouses to verbal cues. Pt able to perform there-ex in seated position, however still needs min assist for correction. Good endurance with ambulation to recliner, however does sit with L lateral leaning. Cues for correction. Will continue to progress.   Follow Up Recommendations  SNF     Equipment Recommendations  None recommended by PT    Recommendations for Other Services       Precautions / Restrictions Precautions Precautions: Fall Restrictions Weight Bearing Restrictions: No    Mobility  Bed Mobility Overal bed mobility: Needs Assistance Bed Mobility: Supine to Sit     Supine to sit: Min assist     General bed mobility comments: assist for sliding B LEs off EOB. Pt follows commands well for bed mobility. Once seated at EOB, pt able to sit with upright posture.  Transfers Overall transfer level: Needs assistance Equipment used: Rolling walker (2 wheeled) Transfers: Sit to/from Stand Sit to Stand: Min assist         General transfer comment: Improved technique for standing from lower bed. Once standing, pt with upright posture  Ambulation/Gait Ambulation/Gait assistance: Min assist Ambulation Distance (Feet): 5 Feet Assistive device: Rolling walker (2 wheeled) Gait Pattern/deviations: Step-to pattern     General Gait Details: short step to gait pattern to recliner with safe technique. Pt with L lateral leaning during ambulation, needing min assist for correction.   Stairs            Wheelchair Mobility     Modified Rankin (Stroke Patients Only)       Balance                                            Cognition Arousal/Alertness: Awake/alert Behavior During Therapy: WFL for tasks assessed/performed Overall Cognitive Status: Within Functional Limits for tasks assessed                                        Exercises Other Exercises Other Exercises: seated ther-ex performed including B LE LAQ, SLRs, and hip abd/add. ALl ther-ex performed x 15 reps with B LE needing min assist for completion.    General Comments        Pertinent Vitals/Pain Pain Assessment: No/denies pain    Home Living                      Prior Function            PT Goals (current goals can now be found in the care plan section) Acute Rehab PT Goals Patient Stated Goal: to get stronger PT Goal Formulation: With patient Time For Goal Achievement: 11/24/16 Potential to Achieve Goals: Good Progress towards PT goals: Progressing toward goals    Frequency    Min 2X/week      PT Plan Current plan remains appropriate  Co-evaluation             End of Session Equipment Utilized During Treatment: Gait belt Activity Tolerance: Patient tolerated treatment well Patient left: in chair;with chair alarm set Nurse Communication: Mobility status PT Visit Diagnosis: Unsteadiness on feet (R26.81);Muscle weakness (generalized) (M62.81)     Time: 9169-4503 PT Time Calculation (min) (ACUTE ONLY): 15 min  Charges:  $Therapeutic Exercise: 8-22 mins                    G Codes:       Greggory Stallion, PT, DPT [phone]    Yatziri Wainwright 11/14/2016, 2:37 PM

## 2016-11-14 NOTE — Progress Notes (Signed)
Pharmacy Antibiotic Note  [name] [name] is a 83 y.o. female admitted on 11/06/2016 with bacteremia.  Pharmacy has been consulted for vancomycin dosing.  Plan: Patient has been on vancomycin for 10 days now. Pharmacy was getting a trough today at 1730, however, order DC by NP at 1230. Called Dr. Ola Spurr about continuing vancomycin and he would like to switch to doxycyline 100mg  BID x14days starting tonight.   Height: 5\' 5"  (165.1 cm) Weight: 139 lb 1.8 oz (63.1 kg) IBW/kg (Calculated) : 57  Temp (24hrs), Avg:98.1 F (36.7 C), Min:97.9 F (36.6 C), Max:98.3 F (36.8 C)   Recent Labs Lab 11/08/16 0645 11/09/16 0412 11/10/16 0504 11/12/16 0358 11/13/16 0447 11/14/16 0511  WBC 8.2  --   --   --   --   --   CREATININE 0.90 0.82 0.76 0.86 0.97 0.88  VANCOTROUGH  --   --  14*  --   --   --     Estimated Creatinine Clearance: 44.4 mL/min (by C-G formula based on SCr of 0.88 mg/dL).    Allergies  Allergen Reactions  . Penicillins Rash    Has patient had a PCN reaction causing immediate rash, facial/tongue/throat swelling, SOB or lightheadedness with hypotension: No Has patient had a PCN reaction causing severe rash involving mucus membranes or skin necrosis: No Has patient had a PCN reaction that required hospitalization No Has patient had a PCN reaction occurring within the last 10 years: No If all of the above answers are "NO", then may proceed with Cephalosporin use.     Antimicrobials this admission: Vancomycin 4/12 >>4/20 Cefepime 4/17>> 4/19 Clindamycin 4/12 >>4/13   Microbiology results: 4/13 BCx: No growth (final) 4/12 BCX: staphylococcus species (coagulase negative) 4/12 UCx: no growth    Thank you for allowing pharmacy to be a part of this patient's care.  Loree Fee, PharmD 11/14/2016 5:24 PM

## 2016-11-14 NOTE — Progress Notes (Signed)
MEDICATION RELATED CONSULT NOTE - INITIAL   Pharmacy Consult for Electrolyte management Indication: hypomagnesemia, hypophosphatemia  Allergies  Allergen Reactions  . Penicillins Rash    Has patient had a PCN reaction causing immediate rash, facial/tongue/throat swelling, SOB or lightheadedness with hypotension: No Has patient had a PCN reaction causing severe rash involving mucus membranes or skin necrosis: No Has patient had a PCN reaction that required hospitalization No Has patient had a PCN reaction occurring within the last 10 years: No If all of the above answers are "NO", then may proceed with Cephalosporin use.     Patient Measurements: Height: 5\' 5"  (165.1 cm) Weight: 139 lb 1.8 oz (63.1 kg) IBW/kg (Calculated) : 57 Adjusted Body Weight:    Vital Signs: Temp: 98.3 F (36.8 C) (04/20 0459) Temp Source: Oral (04/20 0459) BP: 113/60 (04/20 0459) Pulse Rate: 85 (04/20 0459) Intake/Output from previous day: 04/19 0701 - 04/20 0700 In: 480 [P.O.:480] Out: -  Intake/Output from this shift: No intake/output data recorded.  Labs:  Recent Labs  11/11/16 1729 11/12/16 0358 11/13/16 0447 11/14/16 0511  HGB 9.6*  --   --   --   CREATININE  --  0.86 0.97 0.88  MG  --   --  1.7 1.7  PHOS  --   --  2.7 3.3    Lab Results  Component Value Date   K 4.6 11/14/2016   Estimated Creatinine Clearance: 44.4 mL/min (by C-G formula based on SCr of 0.88 mg/dL).   Medical History: Past Medical History:  Diagnosis Date  . Anxiety state, unspecified   . CAD (coronary artery disease)   . Cancer [facility])    2004 Right breast, found on mammogram, radiation therapy, Dr. Bryson Ha, uterine cancer,   . COPD (chronic obstructive pulmonary disease) ([place])   . Diabetes mellitus   . Esophageal reflux   . Heart burn   . Hyperlipidemia   . IBS (irritable bowel syndrome)   . Myocardial infarction [facility])    Cath negative except for 40% occlusion LAD.  Pt not candidate for betablocker or  ACEI because of hypotension  . OSA (obstructive sleep apnea)   . Osteoporosis 02/21/09   DEXA scan showed osteoporosis with left femur T-score -2.8.  Marland Kitchen Presbyacusis   . Stroke ([place])   . Thyroid disease    Hypothyroid  . Vitamin D deficiency     Medications:  Scheduled:  . atorvastatin  80 mg Oral q1800  . chlorpheniramine-HYDROcodone  5 mL Oral Q12H  . docusate sodium  100 mg Oral BID  . enoxaparin (LOVENOX) injection  40 mg Subcutaneous Q24H  . feeding supplement (GLUCERNA SHAKE)  237 mL Oral BID BM  . insulin aspart  0-15 Units Subcutaneous TID WC  . insulin aspart  0-5 Units Subcutaneous QHS  . insulin glargine  12 Units Subcutaneous Daily  . levothyroxine  100 mcg Oral QAC breakfast  . mouth rinse  15 mL Mouth Rinse BID  . pantoprazole sodium  40 mg Per Tube BID  . polyethylene glycol  17 g Oral Daily   Infusions:  . 0.9 % NaCl with KCl 20 mEq / L 50 mL/hr at 11/13/16 2137  . ceFEPIme (MAXIPIME) 2 GM IVP Stopped (11/13/16 2208)  . vancomycin      Assessment: 83 yo F admitted with DKA, upper GI bleed, AKI.  Plan:  4/15: K 3.1, Mag 1.9, Phos 2.1, Scr 0.76 Patient is on D5W w/ 20 meq KCL at 50 ml/hr. Will order Potassium Phosphate  15 mmol IV x 1. Will f/u with am labs.  4/18 0358 K 4.3, Ca 8.3. Patient remains on NS with 20 mEq/L @ 50 mL/hr to provide approximately 24 mEq potassium per day. No further supplement at this time. Will recheck all electrolytes tomorrow with AM labs.  4/19 K 4.4, Ca 8.3, Mg 1.7, phos 2.7. Patient remains on NS with 20 mEq/L @ 50 mL/hr. No further supplement at this time. Will recheck all electrolytes tomorrow with AM labs.  4/20 K 4.6, Ca 8.3, Mg 1.7, phos 3.3. Patient remains on NS with 20 mEq/L @ 50 mL/hr. No further supplement at this time. Will recheck electrolytes with AM labs in 2 days.  Errick Salts A. Jordan Hawks, PharmD, BCPS Clinical Pharmacist 11/14/2016 7:21 AM

## 2016-11-14 NOTE — Progress Notes (Signed)
Pt will be discharged today, IV removed all belongings packed and returned to patient. She will be changed into a transfer pack and is currently awaiting ems transfer. Report called to Margaretha Sheffield at [facility]

## 2016-11-15 DIAGNOSIS — J189 Pneumonia, unspecified organism: Secondary | ICD-10-CM | POA: Diagnosis not present

## 2016-11-17 DIAGNOSIS — J189 Pneumonia, unspecified organism: Secondary | ICD-10-CM | POA: Diagnosis not present

## 2016-11-17 LAB — GLUCOSE, CAPILLARY
GLUCOSE-CAPILLARY: 432 mg/dL — AB (ref 65–99)
GLUCOSE-CAPILLARY: 242 mg/dL — AB (ref 65–99)
GLUCOSE-CAPILLARY: 121 mg/dL — AB (ref 65–99)
GLUCOSE-CAPILLARY: 404 mg/dL — AB (ref 65–99)
GLUCOSE-CAPILLARY: 218 mg/dL — AB (ref 65–99)
Glucose-Capillary: 329 mg/dL — ABNORMAL HIGH (ref 65–99)
Glucose-Capillary: 252 mg/dL — ABNORMAL HIGH (ref 65–99)
Glucose-Capillary: 291 mg/dL — ABNORMAL HIGH (ref 65–99)
Glucose-Capillary: 229 mg/dL — ABNORMAL HIGH (ref 65–99)

## 2016-11-18 ENCOUNTER — Other Ambulatory Visit
Admission: RE | Admit: 2016-11-18 | Discharge: 2016-11-18 | Disposition: A | Discharge status: Home / Self Care | Payer: Medicare Other | Source: Other Acute Inpatient Hospital | Attending: Internal Medicine | Admitting: Internal Medicine

## 2016-11-18 ENCOUNTER — Other Ambulatory Visit: Payer: Self-pay | Admitting: *Deleted

## 2016-11-18 DIAGNOSIS — E118 Type 2 diabetes mellitus with unspecified complications: Secondary | ICD-10-CM | POA: Diagnosis not present

## 2016-11-18 DIAGNOSIS — J189 Pneumonia, unspecified organism: Secondary | ICD-10-CM | POA: Diagnosis not present

## 2016-11-18 DIAGNOSIS — Z8673 Personal history of transient ischemic attack (TIA), and cerebral infarction without residual deficits: Secondary | ICD-10-CM | POA: Diagnosis not present

## 2016-11-18 DIAGNOSIS — Z794 Long term (current) use of insulin: Secondary | ICD-10-CM | POA: Diagnosis not present

## 2016-11-18 DIAGNOSIS — E1165 Type 2 diabetes mellitus with hyperglycemia: Secondary | ICD-10-CM | POA: Diagnosis not present

## 2016-11-18 LAB — COMPREHENSIVE METABOLIC PANEL WITH GFR
ALT: 20 U/L (ref 14–54)
AST: 33 U/L (ref 15–41)
Albumin: 3.1 g/dL — ABNORMAL LOW (ref 3.5–5.0)
Alkaline Phosphatase: 119 U/L (ref 38–126)
Anion gap: 9 (ref 5–15)
BUN: 14 mg/dL (ref 6–20)
CO2: 24 mmol/L (ref 22–32)
Calcium: 8.9 mg/dL (ref 8.9–10.3)
Chloride: 97 mmol/L — ABNORMAL LOW (ref 101–111)
Creatinine, Ser: 1.27 mg/dL — ABNORMAL HIGH (ref 0.44–1.00)
GFR calc Af Amer: 44 mL/min — ABNORMAL LOW
GFR calc non Af Amer: 38 mL/min — ABNORMAL LOW
Glucose, Bld: 444 mg/dL — ABNORMAL HIGH (ref 65–99)
Potassium: 4.8 mmol/L (ref 3.5–5.1)
Sodium: 130 mmol/L — ABNORMAL LOW (ref 135–145)
Total Bilirubin: 0.7 mg/dL (ref 0.3–1.2)
Total Protein: 6.5 g/dL (ref 6.5–8.1)

## 2016-11-18 LAB — GLUCOSE, CAPILLARY
Glucose-Capillary: 143 mg/dL — ABNORMAL HIGH (ref 65–99)
Glucose-Capillary: 365 mg/dL — ABNORMAL HIGH (ref 65–99)
Glucose-Capillary: 309 mg/dL — ABNORMAL HIGH (ref 65–99)
Glucose-Capillary: 287 mg/dL — ABNORMAL HIGH (ref 65–99)
Glucose-Capillary: 493 mg/dL — ABNORMAL HIGH (ref 65–99)
Glucose-Capillary: 92 mg/dL (ref 65–99)
Glucose-Capillary: 114 mg/dL — ABNORMAL HIGH (ref 65–99)

## 2016-11-18 LAB — CBC WITH DIFFERENTIAL/PLATELET
Basophils Absolute: 0 10*3/uL (ref 0–0.1)
Basophils Relative: 0 %
EOS ABS: 0.3 10*3/uL (ref 0–0.7)
EOS PCT: 5 %
HCT: 31.6 % — ABNORMAL LOW (ref 35.0–47.0)
HEMOGLOBIN: 10.6 g/dL — AB (ref 12.0–16.0)
LYMPHS ABS: 0.8 10*3/uL — AB (ref 1.0–3.6)
LYMPHS PCT: 15 %
MCH: 32.3 pg (ref 26.0–34.0)
MCHC: 33.4 g/dL (ref 32.0–36.0)
MCV: 96.6 fL (ref 80.0–100.0)
MONOS PCT: 6 %
Monocytes Absolute: 0.3 10*3/uL (ref 0.2–0.9)
NEUTROS PCT: 74 %
Neutro Abs: 4 10*3/uL (ref 1.4–6.5)
Platelets: 314 10*3/uL (ref 150–440)
RBC: 3.27 MIL/uL — AB (ref 3.80–5.20)
RDW: 14.8 % — ABNORMAL HIGH (ref 11.5–14.5)
WBC: 5.4 10*3/uL (ref 3.6–11.0)

## 2016-11-18 NOTE — Patient Outreach (Signed)
  [facility]) Care Management  [facility] Social Work  11/18/2016  VAUDIE ENGEBRETSEN [birth year] [id number]  Subjective:  Patient is a 83 year old female currently at [facility] for rehab. Patient reported increased depression, did not feel that rehab was going to be helpful. Patient very discouraged today regarding her rehab potential. Patient discussed wanting to "go home" to heaven. Patient discussed thoughts of self harm with no plan and was not able to identify motivation for continued rehab efforts.    Objective:   Encounter Medications:  Outpatient Encounter Prescriptions as of 11/18/2016  Medication Sig Note  . aspirin EC 81 MG tablet Take 1 tablet (81 mg total) by mouth daily.   Marland Kitchen atorvastatin (LIPITOR) 80 MG tablet Take 1 tablet (80 mg total) by mouth daily.   . clopidogrel (PLAVIX) 75 MG tablet Take 1 tablet (75 mg total) by mouth daily.   Marland Kitchen doxycycline (VIBRA-TABS) 100 MG tablet Take 1 tablet (100 mg total) by mouth 2 (two) times daily.   . feeding supplement, GLUCERNA SHAKE, (GLUCERNA SHAKE) LIQD Take 237 mLs by mouth 2 (two) times daily between meals.   . fluticasone (FLONASE) 50 MCG/ACT nasal spray Place 2 sprays into both nostrils daily.    Marland Kitchen glucagon 1 MG injection Inject 1 mg into the vein once as needed. 10/21/2016: Available if needed.   . insulin glargine (LANTUS) 100 UNIT/ML injection Inject 0.14 mLs (14 Units total) into the skin daily.   Marland Kitchen levofloxacin (LEVAQUIN) 750 MG tablet Take 1 tablet (750 mg total) by mouth daily.   Marland Kitchen levothyroxine (SYNTHROID, LEVOTHROID) 100 MCG tablet TAKE 1 TABLET(100 MCG) BY MOUTH DAILY   . metFORMIN (GLUCOPHAGE) 500 MG tablet TAKE 1 TABLET(500 MG) BY MOUTH TWICE DAILY WITH A MEAL   . pantoprazole (PROTONIX) 40 MG tablet Take 1 tablet (40 mg total) by mouth 2 (two) times daily.    No facility-administered encounter medications on file as of 11/18/2016.     Functional Status:  In your present state of health, do you have any  difficulty performing the following activities: 11/06/2016 08/15/2016  Hearing? Tempie Donning  Vision? N N  Difficulty concentrating or making decisions? N Y  Walking or climbing stairs? N Y  Dressing or bathing? N N  Doing errands, shopping? N Y  Conservation officer, nature and eating ? - Y  Using the Toilet? - N  In the past six months, have you accidently leaked urine? - Y  Do you have problems with loss of bowel control? - N  Managing your Medications? - Y  Managing your Finances? - Y  Housekeeping or managing your Housekeeping? - Y  Some recent data might be hidden    Fall/Depression Screening:  PHQ 2/9 Scores 08/15/2016 04/01/2016 01/04/2016 08/15/2014 07/26/2012 06/15/2012  PHQ - 2 Score 0 0 0 0 0 3    Assessment:  Patient in poor mood, increased depression, refusing to eat her lunch initially and to participate in any further rehab. Patient discussed thoughts of self harm which was reported to the discharge planner Geralyn Flash who will put in a order for a psych consult.   Plan:  This social worker will follow up with psych consult results and  patient's progress in rehab within 1 week.

## 2016-11-19 ENCOUNTER — Ambulatory Visit: Payer: Medicare Other | Admitting: Family Medicine

## 2016-11-19 LAB — GLUCOSE, CAPILLARY
Glucose-Capillary: 146 mg/dL — ABNORMAL HIGH (ref 65–99)
Glucose-Capillary: 113 mg/dL — ABNORMAL HIGH (ref 65–99)
Glucose-Capillary: 389 mg/dL — ABNORMAL HIGH (ref 65–99)
Glucose-Capillary: 404 mg/dL — ABNORMAL HIGH (ref 65–99)

## 2016-11-20 DIAGNOSIS — E1165 Type 2 diabetes mellitus with hyperglycemia: Secondary | ICD-10-CM | POA: Diagnosis not present

## 2016-11-20 DIAGNOSIS — Z79899 Other long term (current) drug therapy: Secondary | ICD-10-CM | POA: Diagnosis not present

## 2016-11-20 DIAGNOSIS — Z794 Long term (current) use of insulin: Secondary | ICD-10-CM | POA: Diagnosis not present

## 2016-11-21 ENCOUNTER — Ambulatory Visit: Payer: Medicare Other | Admitting: *Deleted

## 2016-11-21 DIAGNOSIS — J189 Pneumonia, unspecified organism: Secondary | ICD-10-CM | POA: Diagnosis not present

## 2016-11-21 LAB — GLUCOSE, CAPILLARY
GLUCOSE-CAPILLARY: 150 mg/dL — AB (ref 65–99)
GLUCOSE-CAPILLARY: 257 mg/dL — AB (ref 65–99)
GLUCOSE-CAPILLARY: 119 mg/dL — AB (ref 65–99)
GLUCOSE-CAPILLARY: 188 mg/dL — AB (ref 65–99)
Glucose-Capillary: 88 mg/dL (ref 65–99)
Glucose-Capillary: 331 mg/dL — ABNORMAL HIGH (ref 65–99)
Glucose-Capillary: 474 mg/dL — ABNORMAL HIGH (ref 65–99)

## 2016-11-24 ENCOUNTER — Other Ambulatory Visit: Payer: Self-pay | Admitting: *Deleted

## 2016-11-24 LAB — GLUCOSE, CAPILLARY
GLUCOSE-CAPILLARY: 324 mg/dL — AB (ref 65–99)
GLUCOSE-CAPILLARY: 270 mg/dL — AB (ref 65–99)
GLUCOSE-CAPILLARY: 157 mg/dL — AB (ref 65–99)
GLUCOSE-CAPILLARY: 268 mg/dL — AB (ref 65–99)
Glucose-Capillary: 239 mg/dL — ABNORMAL HIGH (ref 65–99)
Glucose-Capillary: 111 mg/dL — ABNORMAL HIGH (ref 65–99)
Glucose-Capillary: 73 mg/dL (ref 65–99)
Glucose-Capillary: 97 mg/dL (ref 65–99)
Glucose-Capillary: 340 mg/dL — ABNORMAL HIGH (ref 65–99)
Glucose-Capillary: 56 mg/dL — ABNORMAL LOW (ref 65–99)

## 2016-11-24 NOTE — Patient Outreach (Signed)
[facility]) Care Management  11/24/2016  MARIYA MOTTLEY [birth year] [id number]   Phone call to Leonette Nutting, discharge planner for [facility] to follow up on patient while in rehab. Voicemail message left requesting a return call.   Sheralyn Boatman [facility] Behavioral Care Management [phone]

## 2016-11-25 ENCOUNTER — Encounter
Admission: RE | Admit: 2016-11-25 | Discharge: 2016-11-25 | Disposition: A | Discharge status: Home / Self Care | Payer: Medicare Other | Source: Ambulatory Visit | Attending: Internal Medicine | Admitting: Internal Medicine

## 2016-11-25 ENCOUNTER — Ambulatory Visit: Payer: Self-pay | Admitting: *Deleted

## 2016-11-25 ENCOUNTER — Other Ambulatory Visit: Payer: Self-pay | Admitting: *Deleted

## 2016-11-25 DIAGNOSIS — E119 Type 2 diabetes mellitus without complications: Secondary | ICD-10-CM | POA: Insufficient documentation

## 2016-11-25 LAB — GLUCOSE, CAPILLARY
Glucose-Capillary: 306 mg/dL — ABNORMAL HIGH (ref 65–99)
Glucose-Capillary: 215 mg/dL — ABNORMAL HIGH (ref 65–99)

## 2016-11-25 NOTE — Patient Outreach (Addendum)
[facility]) Care Management  11/25/2016  [name] [name] 16-Jul-1932 [id number]    Late Entry-Phone call from discharge planner at [facility] on 11/24/16 who reported that patient was making slow progress in rehab. Per the discharge planner, patient has been refusing to go to therapy. The recommendation at this time is 24 hour care. Patient's daughter is considering long term care as she has concerns that she will not be able to provide the care patient needs due to receiving custody recently of her granddaughter. Patient's mood has improved, per discharge planner patient denies thoughts of self harm. Psych consult to take place tomorrow.   Plan: This social worker will follow up with patient at the ALF on 11/27/16.    Sheralyn Boatman [facility] Care Management [phone]

## 2016-11-26 ENCOUNTER — Non-Acute Institutional Stay (SKILLED_NURSING_FACILITY): Payer: Medicare Other | Admitting: Gerontology

## 2016-11-26 DIAGNOSIS — E1165 Type 2 diabetes mellitus with hyperglycemia: Secondary | ICD-10-CM

## 2016-11-26 DIAGNOSIS — E1151 Type 2 diabetes mellitus with diabetic peripheral angiopathy without gangrene: Secondary | ICD-10-CM | POA: Diagnosis not present

## 2016-11-26 DIAGNOSIS — F0150 Vascular dementia without behavioral disturbance: Secondary | ICD-10-CM | POA: Diagnosis not present

## 2016-11-26 DIAGNOSIS — IMO0002 Reserved for concepts with insufficient information to code with codable children: Secondary | ICD-10-CM

## 2016-11-26 LAB — GLUCOSE, CAPILLARY
GLUCOSE-CAPILLARY: 108 mg/dL — AB (ref 65–99)
Glucose-Capillary: 210 mg/dL — ABNORMAL HIGH (ref 65–99)

## 2016-11-27 ENCOUNTER — Other Ambulatory Visit: Payer: Self-pay | Admitting: *Deleted

## 2016-11-27 ENCOUNTER — Other Ambulatory Visit
Admission: RE | Admit: 2016-11-27 | Discharge: 2016-11-27 | Disposition: A | Discharge status: Home / Self Care | Payer: Medicare Other | Source: Ambulatory Visit | Attending: Internal Medicine | Admitting: Internal Medicine

## 2016-11-27 DIAGNOSIS — E119 Type 2 diabetes mellitus without complications: Secondary | ICD-10-CM | POA: Diagnosis not present

## 2016-11-27 LAB — GLUCOSE, CAPILLARY
GLUCOSE-CAPILLARY: 154 mg/dL — AB (ref 65–99)
GLUCOSE-CAPILLARY: 174 mg/dL — AB (ref 65–99)
GLUCOSE-CAPILLARY: 199 mg/dL — AB (ref 65–99)
GLUCOSE-CAPILLARY: 87 mg/dL (ref 65–99)
Glucose-Capillary: 285 mg/dL — ABNORMAL HIGH (ref 65–99)
Glucose-Capillary: 304 mg/dL — ABNORMAL HIGH (ref 65–99)
Glucose-Capillary: 144 mg/dL — ABNORMAL HIGH (ref 65–99)
Glucose-Capillary: 446 mg/dL — ABNORMAL HIGH (ref 65–99)
Glucose-Capillary: 134 mg/dL — ABNORMAL HIGH (ref 65–99)
Glucose-Capillary: 77 mg/dL (ref 65–99)
Glucose-Capillary: 337 mg/dL — ABNORMAL HIGH (ref 65–99)
Glucose-Capillary: 91 mg/dL (ref 65–99)

## 2016-11-27 LAB — BASIC METABOLIC PANEL
Anion gap: 8 (ref 5–15)
BUN: 17 mg/dL (ref 6–20)
CHLORIDE: 100 mmol/L — AB (ref 101–111)
CO2: 27 mmol/L (ref 22–32)
CREATININE: 1.04 mg/dL — AB (ref 0.44–1.00)
Calcium: 9.6 mg/dL (ref 8.9–10.3)
GFR calc non Af Amer: 49 mL/min — ABNORMAL LOW (ref >60)
GFR, EST AFRICAN AMERICAN: 56 mL/min — AB (ref >60)
Glucose, Bld: 98 mg/dL (ref 65–99)
POTASSIUM: 4.1 mmol/L (ref 3.5–5.1)
Sodium: 135 mmol/L (ref 135–145)

## 2016-11-27 NOTE — Patient Outreach (Signed)
[place]) Care Management  11/27/2016  [name] [name] June 29, 1932 [id number]   Return call from Leonette Nutting, discharge planner at [facility]. Per [name] [name], there was a care planning meeting scheduled for 12/02/16 however patient's daughter cancelled. Care Planning meeting will be re-scheduled for next week where all options will be explored including [facility] and the possibility of moving in with her son in [place]. Per [name] [name], she will contact this Education officer, museum when final decision is made.    Sheralyn Boatman [facility] Care Management [phone]

## 2016-11-27 NOTE — Patient Outreach (Signed)
[place] [facility]) Care Management  [facility] Social Work  11/27/2016  [name] [name] 10/29/1931 [id number]  Subjective:   Patient is a 83 year female currently in rehabat [facility]. Patient  very frustrated  And depressed today. Patient states that her main caregiver will be taking custody of her grand daughter and will have little time to dedicate  to her care. Patient's son Laverna Peace participates daily in the adult day program through [employer] care and is limited in his capacity to provide care. Patient has a son in [place] who takes care of her financially but patient reportsI needing more than financial assistance.  Patient would consider moving to [place] with her son, however has not spoken with him about this. Patient's grandson's fiancee [name] would assist with care but does not have a car to drive.  Patient states that she has not refused PT and OT and wants to go home." I wished I had someone to take care of me". Objective:   Encounter Medications:  Outpatient Encounter Prescriptions as of 11/27/2016  Medication Sig Note  . aspirin EC 81 MG tablet Take 1 tablet (81 mg total) by mouth daily.   Marland Kitchen atorvastatin (LIPITOR) 80 MG tablet Take 1 tablet (80 mg total) by mouth daily.   . clopidogrel (PLAVIX) 75 MG tablet Take 1 tablet (75 mg total) by mouth daily.   . feeding supplement, GLUCERNA SHAKE, (GLUCERNA SHAKE) LIQD Take 237 mLs by mouth 2 (two) times daily between meals.   . fluticasone (FLONASE) 50 MCG/ACT nasal spray Place 2 sprays into both nostrils daily.    Marland Kitchen glucagon 1 MG injection Inject 1 mg into the vein once as needed. 10/21/2016: Available if needed.   . insulin glargine (LANTUS) 100 UNIT/ML injection Inject 0.14 mLs (14 Units total) into the skin daily.   Marland Kitchen levofloxacin (LEVAQUIN) 750 MG tablet Take 1 tablet (750 mg total) by mouth daily.   Marland Kitchen levothyroxine (SYNTHROID, LEVOTHROID) 100 MCG tablet TAKE 1 TABLET(100 MCG) BY MOUTH DAILY   . metFORMIN (GLUCOPHAGE)  500 MG tablet TAKE 1 TABLET(500 MG) BY MOUTH TWICE DAILY WITH A MEAL   . pantoprazole (PROTONIX) 40 MG tablet Take 1 tablet (40 mg total) by mouth 2 (two) times daily.    No facility-administered encounter medications on file as of 11/27/2016.     Functional Status:  In your present state of health, do you have any difficulty performing the following activities: 11/06/2016 08/15/2016  Hearing? Tempie Donning  Vision? N N  Difficulty concentrating or making decisions? N Y  Walking or climbing stairs? N Y  Dressing or bathing? N N  Doing errands, shopping? N Y  Conservation officer, nature and eating ? - Y  Using the Toilet? - N  In the past six months, have you accidently leaked urine? - Y  Do you have problems with loss of bowel control? - N  Managing your Medications? - Y  Managing your Finances? - Y  Housekeeping or managing your Housekeeping? - Y  Some recent data might be hidden    Fall/Depression Screening:  PHQ 2/9 Scores 08/15/2016 04/01/2016 01/04/2016 08/15/2014 07/26/2012 06/15/2012  PHQ - 2 Score 0 0 0 0 0 3    Assessment: Patient depressed, very anxious regarding hr discharge plan. Patient adamant that she does not want to transition to long term care. Patient, however does understand that she needs consistent in care.  This Education officer, museum explored with patient possible options for care, suggested possibility of moving to [place]  with her son. She is willing to discuss this with her son.  Family meeting suggested to discuss additional options.  Collaboration phone call with RNCM Kathie Rhodes who agrees that patient would need a caregiver if she discharges home. This social worker was informed that patient had arranged to go to [facility] before her admit to the hospital. [facility] discussed as a possible option post discharge from rehab if appropriate.    Discharge planner Geralyn Flash, not available during visit. Voicemail message left with her to return this social worker's call  to discuss discharge plan and to arrange a family meeting,   [place] [place] Management [phone]   Plan:

## 2016-11-28 DIAGNOSIS — E119 Type 2 diabetes mellitus without complications: Secondary | ICD-10-CM | POA: Diagnosis not present

## 2016-12-01 DIAGNOSIS — E119 Type 2 diabetes mellitus without complications: Secondary | ICD-10-CM | POA: Diagnosis not present

## 2016-12-01 LAB — GLUCOSE, CAPILLARY
GLUCOSE-CAPILLARY: 135 mg/dL — AB (ref 65–99)
GLUCOSE-CAPILLARY: 243 mg/dL — AB (ref 65–99)
GLUCOSE-CAPILLARY: 147 mg/dL — AB (ref 65–99)
GLUCOSE-CAPILLARY: 252 mg/dL — AB (ref 65–99)
GLUCOSE-CAPILLARY: 137 mg/dL — AB (ref 65–99)
GLUCOSE-CAPILLARY: 113 mg/dL — AB (ref 65–99)
GLUCOSE-CAPILLARY: 245 mg/dL — AB (ref 65–99)
Glucose-Capillary: 61 mg/dL — ABNORMAL LOW (ref 65–99)
Glucose-Capillary: 291 mg/dL — ABNORMAL HIGH (ref 65–99)
Glucose-Capillary: 347 mg/dL — ABNORMAL HIGH (ref 65–99)
Glucose-Capillary: 209 mg/dL — ABNORMAL HIGH (ref 65–99)
Glucose-Capillary: 203 mg/dL — ABNORMAL HIGH (ref 65–99)
Glucose-Capillary: 227 mg/dL — ABNORMAL HIGH (ref 65–99)

## 2016-12-02 ENCOUNTER — Other Ambulatory Visit: Payer: Self-pay | Admitting: *Deleted

## 2016-12-02 ENCOUNTER — Non-Acute Institutional Stay (SKILLED_NURSING_FACILITY): Payer: Medicare Other | Admitting: Gerontology

## 2016-12-02 DIAGNOSIS — E1165 Type 2 diabetes mellitus with hyperglycemia: Secondary | ICD-10-CM | POA: Diagnosis not present

## 2016-12-02 DIAGNOSIS — IMO0002 Reserved for concepts with insufficient information to code with codable children: Secondary | ICD-10-CM

## 2016-12-02 DIAGNOSIS — E1151 Type 2 diabetes mellitus with diabetic peripheral angiopathy without gangrene: Secondary | ICD-10-CM

## 2016-12-02 DIAGNOSIS — F0150 Vascular dementia without behavioral disturbance: Secondary | ICD-10-CM | POA: Diagnosis not present

## 2016-12-02 DIAGNOSIS — E119 Type 2 diabetes mellitus without complications: Secondary | ICD-10-CM | POA: Diagnosis not present

## 2016-12-02 LAB — GLUCOSE, CAPILLARY
GLUCOSE-CAPILLARY: 367 mg/dL — AB (ref 65–99)
GLUCOSE-CAPILLARY: 126 mg/dL — AB (ref 65–99)
Glucose-Capillary: 191 mg/dL — ABNORMAL HIGH (ref 65–99)
Glucose-Capillary: 167 mg/dL — ABNORMAL HIGH (ref 65–99)
Glucose-Capillary: 417 mg/dL — ABNORMAL HIGH (ref 65–99)

## 2016-12-02 NOTE — Patient Outreach (Signed)
[place]) Care Management  12/02/2016  EDIT RICCIARDELLI [name] [name], [name] [name] [id number]   Phone call from discharge planner Joelene Millin Page. Per Joelene Millin, patient had her care planning meeting today with her daughter and son. Patient's daughter now has custody of her granddaughter and has confirmed that she is unable to provide 24 hour care for patient. Patient's son, is not able to provide 24 hour care as well. They are moving towards long term care, however long term plan has not been confirmed at this time.    Plan: This Education officer, museum will continue to follow patient's progress in rehab.   [name] [name] [facility] Care Management [phone]

## 2016-12-03 DIAGNOSIS — F0150 Vascular dementia without behavioral disturbance: Secondary | ICD-10-CM | POA: Insufficient documentation

## 2016-12-03 LAB — GLUCOSE, CAPILLARY
GLUCOSE-CAPILLARY: 87 mg/dL (ref 65–99)
GLUCOSE-CAPILLARY: 112 mg/dL — AB (ref 65–99)
GLUCOSE-CAPILLARY: 391 mg/dL — AB (ref 65–99)

## 2016-12-03 NOTE — Progress Notes (Signed)
Location:      Place of Service:  SNF (31) Provider:  Toni Arthurs, NP-C  [name], DO  Patient Care Team: [name], DO as PCP - General (Family Medicine) Christene Lye, MD (General Surgery) Haydee Monica, MD (Endocrinology) Lyman Speller, RN as [place], [place], [place] as [place] [facility]  Extended Emergency Contact Information Primary Emergency Contact: Willis,[name] Address: [address]          [place], [place] [address] Johnnette Litter of [place] Phone: [phone] Work Phone: [phone] Mobile Phone: [phone] Relation: Daughter Secondary Emergency Contact: Hassell Done [place] of [place] Phone: [phone] Relation: Son  Code Status:  DNR Goals of care: Advanced Directive information Advanced Directives 11/06/2016  Does Patient Have a Medical Advance Directive? Yes  Type of Advance Directive Catharine  Does patient want to make changes to medical advance directive? No - Patient declined  Copy of Bruno in Chart? No - copy requested  Would patient like information on creating a medical advance directive? -     Chief Complaint  Patient presents with  . Follow-up    HPI:  Pt is a 83 y.o. female seen today for follow up visit. Pt is admitted to the facility for rehab following admission to [facility] for hyperglycemia, ARI, HCAP, weakness. Pt has been participating with PT and OT. She does have memory issues and has been alert and oriented x 2 while here. She is forgetful of instructions given. Very poor short-term recall. She was also diagnoses with HCAP. She is receiving PO antibiotics for this, without complications. Pt has been having very labile blood sugars. Medication adjustments being made periodically. Pt is pleasant. No behavior issues of note. Pt denies n/v/d/f/c/cp/sob/ha/abd pain/dizziness/cough. Pt denies pain. VSS. No  other complaints.      Past Medical History:  Diagnosis Date  . Anxiety state, unspecified   . CAD (coronary artery disease)   . Cancer [facility])    2004 Right breast, found on mammogram, radiation therapy, Dr. Bryson Ha, uterine cancer,   . COPD (chronic obstructive pulmonary disease) ([place])   . Diabetes mellitus   . Esophageal reflux   . Heart burn   . Hyperlipidemia   . IBS (irritable bowel syndrome)   . Myocardial infarction [facility])    Cath negative except for 40% occlusion LAD.  Pt not candidate for betablocker or ACEI because of hypotension  . OSA (obstructive sleep apnea)   . Osteoporosis 02/21/09   DEXA scan showed osteoporosis with left femur T-score -2.8.  Marland Kitchen Presbyacusis   . Stroke ([place])   . Thyroid disease    Hypothyroid  . Vitamin D deficiency    Past Surgical History:  Procedure Laterality Date  . ABDOMINAL HYSTERECTOMY     uterine cancer  . BREAST SURGERY    . COLON SURGERY  2013   done at [place] Reactions  . Penicillins Rash    Has patient had a PCN reaction causing immediate rash, facial/tongue/throat swelling, SOB or lightheadedness with hypotension: No Has patient had a PCN reaction causing severe rash involving mucus membranes or skin necrosis: No Has patient had a PCN reaction that required hospitalization No Has patient had a PCN reaction occurring within the last 10 years: No If all of the above answers are "NO", then may proceed with Cephalosporin use.     Allergies as of 11/26/2016  Reactions   Penicillins Rash   Has patient had a PCN reaction causing immediate rash, facial/tongue/throat swelling, SOB or lightheadedness with hypotension: No Has patient had a PCN reaction causing severe rash involving mucus membranes or skin necrosis: No Has patient had a PCN reaction that required hospitalization No Has patient had a PCN reaction occurring within the last 10 years: No If all of the above answers are "NO", then may proceed with  Cephalosporin use.      Medication List       Accurate as of 11/26/16 11:59 PM. Always use your most recent med list.          aspirin EC 81 MG tablet Take 1 tablet (81 mg total) by mouth daily.   atorvastatin 80 MG tablet Commonly known as:  LIPITOR Take 1 tablet (80 mg total) by mouth daily.   clopidogrel 75 MG tablet Commonly known as:  PLAVIX Take 1 tablet (75 mg total) by mouth daily.   feeding supplement (GLUCERNA SHAKE) Liqd Take 237 mLs by mouth 2 (two) times daily between meals.   fluticasone 50 MCG/ACT nasal spray Commonly known as:  FLONASE Place 2 sprays into both nostrils daily.   glucagon 1 MG injection Inject 1 mg into the vein once as needed.   insulin glargine 100 UNIT/ML injection Commonly known as:  LANTUS Inject 0.14 mLs (14 Units total) into the skin daily.   levofloxacin 750 MG tablet Commonly known as:  LEVAQUIN Take 1 tablet (750 mg total) by mouth daily.   levothyroxine 100 MCG tablet Commonly known as:  SYNTHROID, LEVOTHROID TAKE 1 TABLET(100 MCG) BY MOUTH DAILY   metFORMIN 500 MG tablet Commonly known as:  GLUCOPHAGE TAKE 1 TABLET(500 MG) BY MOUTH TWICE DAILY WITH A MEAL   pantoprazole 40 MG tablet Commonly known as:  PROTONIX Take 1 tablet (40 mg total) by mouth 2 (two) times daily.       Review of Systems  Constitutional: Negative for activity change, appetite change, chills, diaphoresis and fever.  HENT: Negative for congestion, sneezing, sore throat, trouble swallowing and voice change.   Respiratory: Negative for apnea, cough, choking, chest tightness, shortness of breath and wheezing.   Cardiovascular: Negative for chest pain, palpitations and leg swelling.  Gastrointestinal: Negative for abdominal distention, abdominal pain, constipation, diarrhea and nausea.  Genitourinary: Negative for difficulty urinating, dysuria, frequency and urgency.  Musculoskeletal: Negative for back pain, gait problem and myalgias. Arthralgias:  typical arthritis.  Skin: Negative for color change, pallor, rash and wound.  Neurological: Negative for dizziness, tremors, syncope, speech difficulty, weakness, numbness and headaches.  Psychiatric/Behavioral: Positive for confusion. Negative for agitation and behavioral problems.  All other systems reviewed and are negative.   There is no immunization history for the selected administration types on file for this patient. Pertinent  Health Maintenance Due  Topic Date Due  . PNA vac Low Risk Adult (1 of 2 - PCV13) 04/09/2017 (Originally [birth year]/1999)  . INFLUENZA VACCINE  02/25/2017  . FOOT EXAM  04/09/2017  . URINE MICROALBUMIN  04/11/2017  . HEMOGLOBIN A1C  05/09/2017  . OPHTHALMOLOGY EXAM  05/19/2017  . DEXA SCAN  Completed   Fall Risk  08/15/2016 04/01/2016 01/04/2016 03/29/2015 03/29/2015  Falls in the past year? No No No (No Data) No  Risk for fall due to : - Impaired balance/gait;Impaired mobility - - -   Functional Status Survey:    Vitals:   11/25/16 2040  BP: (!) 106/59  Pulse: 74  Resp: 18  Temp: 98 F (36.7 C)  SpO2: 93%   There is no height or weight on file to calculate BMI. Physical Exam  Constitutional: She is oriented to person, place, and time. Vital signs are normal. She appears well-developed and well-nourished. She is active and cooperative. She does not appear ill. No distress.  HENT:  Head: Normocephalic and atraumatic.  Mouth/Throat: Uvula is midline, oropharynx is clear and moist and mucous membranes are normal. Mucous membranes are not pale, not dry and not cyanotic.  Eyes: Conjunctivae, EOM and lids are normal. Pupils are equal, round, and reactive to light.  Neck: Trachea normal, normal range of motion and full passive range of motion without pain. Neck supple. No JVD present. No tracheal deviation, no edema and no erythema present. No thyromegaly present.  Cardiovascular: Normal rate, regular rhythm, normal heart sounds, intact distal pulses and normal  pulses.  Exam reveals no gallop, no distant heart sounds and no friction rub.   No murmur heard. Pulmonary/Chest: Effort normal and breath sounds normal. No accessory muscle usage. No respiratory distress. She has no wheezes. She has no rales. She exhibits no tenderness.  Abdominal: Normal appearance and bowel sounds are normal. She exhibits no distension and no ascites. There is no tenderness.  Musculoskeletal: Normal range of motion. She exhibits no edema or tenderness.  Expected osteoarthritis, stiffness  Neurological: She is alert and oriented to person, place, and time. She has normal strength.  Skin: Skin is warm, dry and intact. No rash noted. She is not diaphoretic. No cyanosis or erythema. No pallor. Nails show no clubbing.  Psychiatric: She has a normal mood and affect. Her speech is normal and behavior is normal. Thought content normal. Cognition and memory are impaired. She expresses impulsivity. She exhibits abnormal recent memory.  Nursing note and vitals reviewed.   Labs reviewed:  Recent Labs  11/10/16 0504  11/13/16 0447 11/14/16 0511 11/18/16 1100 11/27/16 0724  NA 140  < > 134* 132* 130* 135  K 3.1*  < > 4.4 4.6 4.8 4.1  CL 109  < > 101 97* 97* 100*  CO2 24  < > 28 29 24 27   GLUCOSE 86  < > 271* 268* 444* 98  BUN 10  < > 11 9 14 17   CREATININE 0.76  < > 0.97 0.88 1.27* 1.04*  CALCIUM 8.3*  < > 8.3* 8.3* 8.9 9.6  MG 1.9  --  1.7 1.7  --   --   PHOS 2.1*  --  2.7 3.3  --   --   < > = values in this interval not displayed.  Recent Labs  07/15/16 1228 11/06/16 0008 11/06/16 1619 11/18/16 1100  AST 69* 32  --  33  ALT 45 25  --  20  ALKPHOS 121 112  --  119  BILITOT 0.5 1.9*  --  0.7  PROT 7.5 7.5  --  6.5  ALBUMIN 3.9 3.8 3.3* 3.1*    Recent Labs  07/15/16 1228  11/07/16 0744 11/08/16 0645 11/09/16 0730 11/11/16 1729 11/18/16 1100  WBC 4.1  < > 14.2* 8.2  --   --  5.4  NEUTROABS 2.4  --  12.1*  --   --   --  4.0  HGB 11.7*  < > 9.9* 10.1* 10.3*  9.6* 10.6*  HCT 33.5*  < > 29.4* 29.4*  --   --  31.6*  MCV 95.6  < > 97.1 95.8  --   --  96.6  PLT 243  < > 169 161  --   --  314  < > = values in this interval not displayed. Lab Results  Component Value Date   TSH 2.49 08/20/2016   Lab Results  Component Value Date   HGBA1C 10.2 (H) 11/07/2016   Lab Results  Component Value Date   CHOL 204 (H) 08/20/2016   HDL 110.60 08/20/2016   LDLCALC 79 08/20/2016   LDLDIRECT 138.1 06/20/2013   TRIG 76.0 08/20/2016   CHOLHDL 2 08/20/2016    Significant Diagnostic Results in last 30 days:  Dg Chest 2 View  Result Date: 11/11/2016 CLINICAL DATA:  Confusion.  Elevated blood sugar hemoptysis. EXAM: CHEST  2 VIEW COMPARISON:  Chest x-ray 11/06/2016, 03/11/2016. FINDINGS: Mediastinum hilar structures are normal. Bibasilar and right perihilar infiltrates noted consistent with pneumonia. Small bilateral pleural effusions. Mild cardiomegaly. No pulmonary venous congestion. No pneumothorax. IMPRESSION: 1. Bibasilar and right perihilar infiltrates consistent with pneumonia. Small bilateral pleural effusions. 2. Mild cardiomegaly.  No pulmonary venous congestion. Electronically Signed   By: Marcello Moores  Register   On: 11/11/2016 09:02   Ct Head Wo Contrast  Result Date: 11/07/2016 CLINICAL DATA:  Altered mental status EXAM: CT HEAD WITHOUT CONTRAST TECHNIQUE: Contiguous axial images were obtained from the base of the skull through the vertex without intravenous contrast. COMPARISON:  07/15/2016 FINDINGS: Brain: Diffuse atrophic and chronic white matter ischemic changes are noted. The overall appearance is stable from the prior exam. No acute hemorrhage, acute infarction or space-occupying mass lesion is noted. Vascular: No hyperdense vessel or unexpected calcification. Skull: Normal. Negative for fracture or focal lesion. Sinuses/Orbits: No acute finding. Other: None. IMPRESSION: Chronic atrophic and white matter ischemic changes. Electronically Signed   By: Inez Catalina M.D.   On: 11/07/2016 14:47   Ct Angio Chest Pe W Or Wo Contrast  Result Date: 11/11/2016 CLINICAL DATA:  Hemoptysis earlier today. Diminished breath sounds bilaterally. Altered mental status. EXAM: CT ANGIOGRAPHY CHEST WITH CONTRAST TECHNIQUE: Multidetector CT imaging of the chest was performed using the standard protocol during bolus administration of intravenous contrast. Multiplanar CT image reconstructions and MIPs were obtained to evaluate the vascular anatomy. CONTRAST:  75 mL Isovue 370 IV. COMPARISON:  CT 06/08/2015 and 06/16/2011 FINDINGS: Cardiovascular: Heart size is within normal. There is calcified plaque over the left anterior descending coronary artery. Mild calcified plaque over the thoracic aorta. Pulmonary arterial system is within normal without evidence of emboli. Mediastinum/Nodes: No evidence of mediastinal or hilar adenopathy. Small hiatal hernia. Lungs/Pleura: Lungs are adequately inflated and demonstrate a small right pleural effusion and tiny amount of left pleural fluid. Mild associated bibasilar atelectasis. Airways are within normal. Upper Abdomen: Calcified plaque over the abdominal aorta. Musculoskeletal: Mild degenerate change of the spine. Review of the MIP images confirms the above findings. IMPRESSION: No evidence of pulmonary embolism. Small right-sided pleural effusion and tiny amount of left pleural fluid with mild bibasilar atelectasis. Mild atherosclerotic coronary artery disease. Aortic atherosclerosis. Electronically Signed   By: Marin Olp M.D.   On: 11/11/2016 20:06   Dg Chest Port 1 View  Result Date: 11/06/2016 CLINICAL DATA:  Sepsis and hyperglycemia EXAM: PORTABLE CHEST 1 VIEW COMPARISON:  None. FINDINGS: The heart size and mediastinal contours are within normal limits. There is calcific aortic atherosclerosis. Both lungs are clear. The visualized skeletal structures are unremarkable. IMPRESSION: No active disease.  Aortic atherosclerosis.  Electronically Signed   By: Ulyses Jarred M.D.   On: 11/06/2016 01:01   Dg  Abd Portable 1v  Result Date: 11/06/2016 CLINICAL DATA:  83 year old female with a history of nasogastric tube placement EXAM: PORTABLE ABDOMEN - 1 VIEW COMPARISON:  11/06/2016 FINDINGS: Limited image of the lower chest/ upper abdomen demonstrates unchanged position of the gastric tube with the tip terminating in the region the stomach and the side-port near the gastroesophageal junction. No significant airspace disease at the lung base. Aortic calcifications. Gas within small bowel and colon. IMPRESSION: Unchanged position of the gastric tube, with the side port near the GE junction. Aortic atherosclerosis. Electronically Signed   By: Corrie Mckusick D.O.   On: 11/06/2016 09:52   Dg Abd Portable 1v  Result Date: 11/06/2016 CLINICAL DATA:  NG tube placement. EXAM: PORTABLE ABDOMEN - 1 VIEW COMPARISON:  CT abdomen and pelvis 06/08/2015 FINDINGS: Enteric tube tip terminates over the proximal stomach with side hole projecting over the distal esophagus. Gas is present in small and large bowel in the included portion of the upper abdomen, and there is a slightly dilated small bowel loop in the left upper quadrant measuring 3.2 cm. The lower abdomen was not imaged. Aortic atherosclerosis is noted. IMPRESSION: 1. Enteric tube in the proximal stomach with side hole in the distal esophagus. 2. Slight gaseous distension of small bowel on the left upper quadrant, incompletely evaluated. 3. Aortic atherosclerosis. Electronically Signed   By: Logan Bores M.D.   On: 11/06/2016 08:40    Assessment/Plan 1. Vascular dementia without behavioral disturbance  [employer] to follow  Safety precautions  Ambulates to BR with walker and 1 assist  2. DM (diabetes mellitus) type II uncontrolled, periph vascular disorder (HCC)  Continue Lantus 22 units Q Day  Continue Novolog 5 units TID with meals  Novolog SSI prn  Carb-modified diet  FSBS  AC/HS   Family/ staff Communication:   Total Time:  Documentation:  Face to Face:  Family/Phone:   Labs/tests ordered:    Medication list reviewed and assessed for continued appropriateness. Monthly medication orders reviewed and signed.  Vikki Ports, NP-C Geriatrics [facility] [phone], [place] [address] Cell Phone (Mon-Fri 8am-5pm):  [phone] On Call:  [phone] & follow prompts after 5pm & weekends Office Phone:  [phone] Office Fax:  [phone]

## 2016-12-04 DIAGNOSIS — E119 Type 2 diabetes mellitus without complications: Secondary | ICD-10-CM | POA: Diagnosis not present

## 2016-12-04 DIAGNOSIS — E162 Hypoglycemia, unspecified: Secondary | ICD-10-CM | POA: Diagnosis not present

## 2016-12-04 DIAGNOSIS — Z79899 Other long term (current) drug therapy: Secondary | ICD-10-CM | POA: Diagnosis not present

## 2016-12-04 DIAGNOSIS — Z794 Long term (current) use of insulin: Secondary | ICD-10-CM | POA: Diagnosis not present

## 2016-12-04 DIAGNOSIS — E1165 Type 2 diabetes mellitus with hyperglycemia: Secondary | ICD-10-CM | POA: Diagnosis not present

## 2016-12-04 LAB — GLUCOSE, CAPILLARY: GLUCOSE-CAPILLARY: 344 mg/dL — AB (ref 65–99)

## 2016-12-05 DIAGNOSIS — E119 Type 2 diabetes mellitus without complications: Secondary | ICD-10-CM | POA: Diagnosis not present

## 2016-12-05 LAB — GLUCOSE, CAPILLARY
GLUCOSE-CAPILLARY: 209 mg/dL — AB (ref 65–99)
Glucose-Capillary: 109 mg/dL — ABNORMAL HIGH (ref 65–99)
Glucose-Capillary: 362 mg/dL — ABNORMAL HIGH (ref 65–99)
Glucose-Capillary: 112 mg/dL — ABNORMAL HIGH (ref 65–99)
Glucose-Capillary: 136 mg/dL — ABNORMAL HIGH (ref 65–99)
Glucose-Capillary: 392 mg/dL — ABNORMAL HIGH (ref 65–99)

## 2016-12-08 DIAGNOSIS — E119 Type 2 diabetes mellitus without complications: Secondary | ICD-10-CM | POA: Diagnosis not present

## 2016-12-08 LAB — GLUCOSE, CAPILLARY
GLUCOSE-CAPILLARY: 130 mg/dL — AB (ref 65–99)
GLUCOSE-CAPILLARY: 96 mg/dL (ref 65–99)
GLUCOSE-CAPILLARY: 152 mg/dL — AB (ref 65–99)
GLUCOSE-CAPILLARY: 36 mg/dL — AB (ref 65–99)
GLUCOSE-CAPILLARY: 120 mg/dL — AB (ref 65–99)
GLUCOSE-CAPILLARY: 286 mg/dL — AB (ref 65–99)
GLUCOSE-CAPILLARY: 321 mg/dL — AB (ref 65–99)
GLUCOSE-CAPILLARY: 83 mg/dL (ref 65–99)
Glucose-Capillary: 36 mg/dL — CL (ref 65–99)
Glucose-Capillary: 408 mg/dL — ABNORMAL HIGH (ref 65–99)
Glucose-Capillary: 167 mg/dL — ABNORMAL HIGH (ref 65–99)
Glucose-Capillary: 201 mg/dL — ABNORMAL HIGH (ref 65–99)
Glucose-Capillary: 354 mg/dL — ABNORMAL HIGH (ref 65–99)
Glucose-Capillary: 193 mg/dL — ABNORMAL HIGH (ref 65–99)

## 2016-12-09 DIAGNOSIS — E119 Type 2 diabetes mellitus without complications: Secondary | ICD-10-CM | POA: Diagnosis not present

## 2016-12-09 LAB — GLUCOSE, CAPILLARY
GLUCOSE-CAPILLARY: 79 mg/dL (ref 65–99)
GLUCOSE-CAPILLARY: 247 mg/dL — AB (ref 65–99)
GLUCOSE-CAPILLARY: 315 mg/dL — AB (ref 65–99)
GLUCOSE-CAPILLARY: 63 mg/dL — AB (ref 65–99)
Glucose-Capillary: 381 mg/dL — ABNORMAL HIGH (ref 65–99)
Glucose-Capillary: 187 mg/dL — ABNORMAL HIGH (ref 65–99)
Glucose-Capillary: 102 mg/dL — ABNORMAL HIGH (ref 65–99)
Glucose-Capillary: 25 mg/dL — CL (ref 65–99)
Glucose-Capillary: 288 mg/dL — ABNORMAL HIGH (ref 65–99)

## 2016-12-10 ENCOUNTER — Non-Acute Institutional Stay (SKILLED_NURSING_FACILITY): Payer: Medicare Other | Admitting: Gerontology

## 2016-12-10 DIAGNOSIS — E1165 Type 2 diabetes mellitus with hyperglycemia: Secondary | ICD-10-CM | POA: Diagnosis not present

## 2016-12-10 DIAGNOSIS — IMO0002 Reserved for concepts with insufficient information to code with codable children: Secondary | ICD-10-CM

## 2016-12-10 DIAGNOSIS — F0150 Vascular dementia without behavioral disturbance: Secondary | ICD-10-CM | POA: Diagnosis not present

## 2016-12-10 DIAGNOSIS — E1151 Type 2 diabetes mellitus with diabetic peripheral angiopathy without gangrene: Secondary | ICD-10-CM | POA: Diagnosis not present

## 2016-12-10 NOTE — Progress Notes (Signed)
Location:      Place of Service:  SNF (31) Provider:  Toni Arthurs, NP-C  [name], DO  Patient Care Team: [name], DO as PCP - General (Family Medicine) Christene Lye, MD (General Surgery) Haydee Monica, MD (Endocrinology) Lyman Speller, RN as [place], [place], [place] as [employer]  Extended Emergency Contact Information Primary Emergency Contact: Bulger,[name] Address: [address]          [place], [place] [address] Johnnette Litter of [place] Phone: [phone] Work Phone: [phone] Mobile Phone: [phone] Relation: Daughter Secondary Emergency Contact: Hassell Done [place] of [place] Phone: [phone] Relation: Son  Code Status:  DNR Goals of care: Advanced Directive information Advanced Directives 11/06/2016  Does Patient Have a Medical Advance Directive? Yes  Type of Advance Directive [place]  Does patient want to make changes to medical advance directive? No - Patient declined  Copy of [place] in Chart? No - copy requested  Would patient like information on creating a medical advance directive? -     Chief Complaint  Patient presents with  . Follow-up    HPI:  Pt is a 83 y.o. female seen today for follow up visit. Pt is admitted to the facility for rehab following admission to [facility] for hyperglycemia, ARI, HCAP, weakness. Pt has been participating with PT and OT. She does have memory issues and has been alert and oriented x 2 while here. She is forgetful of instructions given. Very poor short-term recall. She was also diagnoses with HCAP. She has completed PO antibiotics for this, without complications. Pt has been having very labile blood sugars. Medication adjustments being made periodically. Pt is pleasant. No behavior issues of note. Pt denies n/v/d/f/c/cp/sob/ha/abd pain/dizziness/cough. Pt denies pain. Pt was  scheduled for discharge to assisted living today. However, 1 hour prior to scheduled discharge time, pt changed her mind and decided she wasn't ready to discharge. Social work has contacted family. Discharge is on hold for now.  Will continue to titrate insulin for better glucose control while here. VSS. No other complaints.      Past Medical History:  Diagnosis Date  . Anxiety state, unspecified   . CAD (coronary artery disease)   . Cancer [facility])    2004 Right breast, found on mammogram, radiation therapy, Dr. Bryson Ha, uterine cancer,   . COPD (chronic obstructive pulmonary disease) ([place])   . Diabetes mellitus   . Esophageal reflux   . Heart burn   . Hyperlipidemia   . IBS (irritable bowel syndrome)   . Myocardial infarction [facility])    Cath negative except for 40% occlusion LAD.  Pt not candidate for betablocker or ACEI because of hypotension  . OSA (obstructive sleep apnea)   . Osteoporosis 02/21/09   DEXA scan showed osteoporosis with left femur T-score -2.8.  Marland Kitchen Presbyacusis   . Stroke ([place])   . Thyroid disease    Hypothyroid  . Vitamin D deficiency    Past Surgical History:  Procedure Laterality Date  . ABDOMINAL HYSTERECTOMY     uterine cancer  . BREAST SURGERY    . COLON SURGERY  2013   done at [place] Reactions  . Penicillins Rash    Has patient had a PCN reaction causing immediate rash, facial/tongue/throat swelling, SOB or lightheadedness with hypotension: No Has patient had a PCN reaction causing severe rash involving mucus  membranes or skin necrosis: No Has patient had a PCN reaction that required hospitalization No Has patient had a PCN reaction occurring within the last 10 years: No If all of the above answers are "NO", then may proceed with Cephalosporin use.     Allergies as of 12/10/2016      Reactions   Penicillins Rash   Has patient had a PCN reaction causing immediate rash, facial/tongue/throat swelling, SOB or lightheadedness with  hypotension: No Has patient had a PCN reaction causing severe rash involving mucus membranes or skin necrosis: No Has patient had a PCN reaction that required hospitalization No Has patient had a PCN reaction occurring within the last 10 years: No If all of the above answers are "NO", then may proceed with Cephalosporin use.      Medication List       Accurate as of 12/10/16 12:25 PM. Always use your most recent med list.          aspirin EC 81 MG tablet Take 1 tablet (81 mg total) by mouth daily.   atorvastatin 80 MG tablet Commonly known as:  LIPITOR Take 1 tablet (80 mg total) by mouth daily.   clopidogrel 75 MG tablet Commonly known as:  PLAVIX Take 1 tablet (75 mg total) by mouth daily.   fluticasone 50 MCG/ACT nasal spray Commonly known as:  FLONASE Place 2 sprays into both nostrils daily.   glucagon 1 MG injection Inject 1 mg into the vein once as needed.   insulin glargine 100 UNIT/ML injection Commonly known as:  LANTUS Inject 0.14 mLs (14 Units total) into the skin daily.   levofloxacin 750 MG tablet Commonly known as:  LEVAQUIN Take 1 tablet (750 mg total) by mouth daily.   levothyroxine 100 MCG tablet Commonly known as:  SYNTHROID, LEVOTHROID TAKE 1 TABLET(100 MCG) BY MOUTH DAILY   metFORMIN 500 MG tablet Commonly known as:  GLUCOPHAGE TAKE 1 TABLET(500 MG) BY MOUTH TWICE DAILY WITH A MEAL   pantoprazole 40 MG tablet Commonly known as:  PROTONIX Take 1 tablet (40 mg total) by mouth 2 (two) times daily.       Review of Systems  Constitutional: Negative for activity change, appetite change, chills, diaphoresis and fever.  HENT: Negative for congestion, sneezing, sore throat, trouble swallowing and voice change.   Respiratory: Negative for apnea, cough, choking, chest tightness, shortness of breath and wheezing.   Cardiovascular: Negative for chest pain, palpitations and leg swelling.  Gastrointestinal: Negative for abdominal distention, abdominal  pain, constipation, diarrhea and nausea.  Genitourinary: Negative for difficulty urinating, dysuria, frequency and urgency.  Musculoskeletal: Negative for back pain, gait problem and myalgias. Arthralgias: typical arthritis.  Skin: Negative for color change, pallor, rash and wound.  Neurological: Negative for dizziness, tremors, syncope, speech difficulty, weakness, numbness and headaches.  Psychiatric/Behavioral: Positive for confusion. Negative for agitation and behavioral problems.  All other systems reviewed and are negative.   There is no immunization history for the selected administration types on file for this patient. Pertinent  Health Maintenance Due  Topic Date Due  . PNA vac Low Risk Adult (1 of 2 - PCV13) 04/09/2017 (Originally 03/15/1999)  . INFLUENZA VACCINE  02/25/2017  . FOOT EXAM  04/09/2017  . URINE MICROALBUMIN  04/11/2017  . HEMOGLOBIN A1C  05/09/2017  . OPHTHALMOLOGY EXAM  05/19/2017  . DEXA SCAN  Completed   Fall Risk  08/15/2016 04/01/2016 01/04/2016 03/29/2015 03/29/2015  Falls in the past year? No No No (No Data) No  Risk for fall due to : - Impaired balance/gait;Impaired mobility - - -   Functional Status Survey:    Vitals:   12/10/16 0530  BP: 98/67  Pulse: 84  Resp: 17  Temp: 98 F (36.7 C)  SpO2: 97%   There is no height or weight on file to calculate BMI. Physical Exam  Constitutional: She is oriented to person, place, and time. Vital signs are normal. She appears well-developed and well-nourished. She is active and cooperative. She does not appear ill. No distress.  HENT:  Head: Normocephalic and atraumatic.  Mouth/Throat: Uvula is midline, oropharynx is clear and moist and mucous membranes are normal. Mucous membranes are not pale, not dry and not cyanotic.  Eyes: Conjunctivae, EOM and lids are normal. Pupils are equal, round, and reactive to light.  Neck: Trachea normal, normal range of motion and full passive range of motion without pain. Neck  supple. No JVD present. No tracheal deviation, no edema and no erythema present. No thyromegaly present.  Cardiovascular: Normal rate, regular rhythm, normal heart sounds, intact distal pulses and normal pulses.  Exam reveals no gallop, no distant heart sounds and no friction rub.   No murmur heard. Pulmonary/Chest: Effort normal and breath sounds normal. No accessory muscle usage. No respiratory distress. She has no wheezes. She has no rales. She exhibits no tenderness.  Abdominal: Normal appearance and bowel sounds are normal. She exhibits no distension and no ascites. There is no tenderness.  Musculoskeletal: Normal range of motion. She exhibits no edema or tenderness.  Expected osteoarthritis, stiffness  Neurological: She is alert and oriented to person, place, and time. She has normal strength.  Skin: Skin is warm, dry and intact. No rash noted. She is not diaphoretic. No cyanosis or erythema. No pallor. Nails show no clubbing.  Psychiatric: She has a normal mood and affect. Her speech is normal and behavior is normal. Thought content normal. Cognition and memory are impaired. She expresses impulsivity. She exhibits abnormal recent memory.  Nursing note and vitals reviewed.   Labs reviewed:  Recent Labs  11/10/16 0504  11/13/16 0447 11/14/16 0511 11/18/16 1100 11/27/16 0724  NA 140  < > 134* 132* 130* 135  K 3.1*  < > 4.4 4.6 4.8 4.1  CL 109  < > 101 97* 97* 100*  CO2 24  < > 28 29 24 27   GLUCOSE 86  < > 271* 268* 444* 98  BUN 10  < > 11 9 14 17   CREATININE 0.76  < > 0.97 0.88 1.27* 1.04*  CALCIUM 8.3*  < > 8.3* 8.3* 8.9 9.6  MG 1.9  --  1.7 1.7  --   --   PHOS 2.1*  --  2.7 3.3  --   --   < > = values in this interval not displayed.  Recent Labs  07/15/16 1228 11/06/16 0008 11/06/16 1619 11/18/16 1100  AST 69* 32  --  33  ALT 45 25  --  20  ALKPHOS 121 112  --  119  BILITOT 0.5 1.9*  --  0.7  PROT 7.5 7.5  --  6.5  ALBUMIN 3.9 3.8 3.3* 3.1*    Recent Labs   07/15/16 1228  11/07/16 0744 11/08/16 0645 11/09/16 0730 11/11/16 1729 11/18/16 1100  WBC 4.1  < > 14.2* 8.2  --   --  5.4  NEUTROABS 2.4  --  12.1*  --   --   --  4.0  HGB 11.7*  < >  9.9* 10.1* 10.3* 9.6* 10.6*  HCT 33.5*  < > 29.4* 29.4*  --   --  31.6*  MCV 95.6  < > 97.1 95.8  --   --  96.6  PLT 243  < > 169 161  --   --  314  < > = values in this interval not displayed. Lab Results  Component Value Date   TSH 2.49 08/20/2016   Lab Results  Component Value Date   HGBA1C 10.2 (H) 11/07/2016   Lab Results  Component Value Date   CHOL 204 (H) 08/20/2016   HDL 110.60 08/20/2016   LDLCALC 79 08/20/2016   LDLDIRECT 138.1 06/20/2013   TRIG 76.0 08/20/2016   CHOLHDL 2 08/20/2016    Significant Diagnostic Results in last 30 days:  Dg Chest 2 View  Result Date: 11/11/2016 CLINICAL DATA:  Confusion.  Elevated blood sugar hemoptysis. EXAM: CHEST  2 VIEW COMPARISON:  Chest x-ray 11/06/2016, 03/11/2016. FINDINGS: Mediastinum hilar structures are normal. Bibasilar and right perihilar infiltrates noted consistent with pneumonia. Small bilateral pleural effusions. Mild cardiomegaly. No pulmonary venous congestion. No pneumothorax. IMPRESSION: 1. Bibasilar and right perihilar infiltrates consistent with pneumonia. Small bilateral pleural effusions. 2. Mild cardiomegaly.  No pulmonary venous congestion. Electronically Signed   By: Marcello Moores  Register   On: 11/11/2016 09:02   Ct Angio Chest Pe W Or Wo Contrast  Result Date: 11/11/2016 CLINICAL DATA:  Hemoptysis earlier today. Diminished breath sounds bilaterally. Altered mental status. EXAM: CT ANGIOGRAPHY CHEST WITH CONTRAST TECHNIQUE: Multidetector CT imaging of the chest was performed using the standard protocol during bolus administration of intravenous contrast. Multiplanar CT image reconstructions and MIPs were obtained to evaluate the vascular anatomy. CONTRAST:  75 mL Isovue 370 IV. COMPARISON:  CT 06/08/2015 and 06/16/2011 FINDINGS:  Cardiovascular: Heart size is within normal. There is calcified plaque over the left anterior descending coronary artery. Mild calcified plaque over the thoracic aorta. Pulmonary arterial system is within normal without evidence of emboli. Mediastinum/Nodes: No evidence of mediastinal or hilar adenopathy. Small hiatal hernia. Lungs/Pleura: Lungs are adequately inflated and demonstrate a small right pleural effusion and tiny amount of left pleural fluid. Mild associated bibasilar atelectasis. Airways are within normal. Upper Abdomen: Calcified plaque over the abdominal aorta. Musculoskeletal: Mild degenerate change of the spine. Review of the MIP images confirms the above findings. IMPRESSION: No evidence of pulmonary embolism. Small right-sided pleural effusion and tiny amount of left pleural fluid with mild bibasilar atelectasis. Mild atherosclerotic coronary artery disease. Aortic atherosclerosis. Electronically Signed   By: Marin Olp M.D.   On: 11/11/2016 20:06    Assessment/Plan 1. Vascular dementia without behavioral disturbance  [employer] to follow  Safety precautions  Ambulates to BR with walker and 1 assist  2. DM (diabetes mellitus) type II uncontrolled, periph vascular disorder (HCC)  Change Lantus to 20 units Q Day  Change Novolog to 12 units in the morning and 6 units with lunch and supper  Novolog SSI prn  Carb-modified diet  FSBS AC/HS   Family/ staff Communication:   Total Time:  Documentation:  Face to Face:  Family/Phone:   Labs/tests ordered:    Medication list reviewed and assessed for continued appropriateness. Monthly medication orders reviewed and signed.  Vikki Ports, NP-C Geriatrics [facility] [phone] [place], [place] [address] Cell Phone (Mon-Fri 8am-5pm):  [phone] On Call:  [phone] & follow prompts after 5pm & weekends Office Phone:  [phone] Office Fax:  [phone]

## 2016-12-10 NOTE — Progress Notes (Signed)
Location:      Place of Service:  SNF (31) Provider:  Toni Arthurs, NP-C  [name], DO  Patient Care Team: [name], DO as PCP - General (Family Medicine) Christene Lye, MD (General Surgery) Haydee Monica, MD (Endocrinology) Lyman Speller, RN as [place], [place], [place] as [place] Management  Extended Emergency Contact Information Primary Emergency Contact: Sperbeck,[name] Address: [address]          [place], [place] [address] Johnnette Litter of [place] Phone: [phone] Work Phone: [phone] Mobile Phone: [phone] Relation: Daughter Secondary Emergency Contact: Hassell Done [place] of [place] Phone: [phone] Relation: Son  Code Status:  DNR Goals of care: Advanced Directive information Advanced Directives 11/06/2016  Does Patient Have a Medical Advance Directive? Yes  Type of Advance Directive [place]  Does patient want to make changes to medical advance directive? No - Patient declined  Copy of [place] in Chart? No - copy requested  Would patient like information on creating a medical advance directive? -     Chief Complaint  Patient presents with  . Follow-up    HPI:  Pt is a 83 y.o. female seen today for follow up visit. Pt is admitted to the facility for rehab following admission to [facility] for hyperglycemia, ARI, HCAP, weakness. Pt has been participating with PT and OT. She does have memory issues and has been alert and oriented x 2 while here. She is forgetful of instructions given. Very poor short-term recall. She was also diagnoses with HCAP. She has completed PO antibiotics for this, without complications. Pt has been having very labile blood sugars. Medication adjustments being made periodically. Pt is pleasant. No behavior issues of note. Pt denies n/v/d/f/c/cp/sob/ha/abd pain/dizziness/cough. Pt denies pain. VSS. No  other complaints.      Past Medical History:  Diagnosis Date  . Anxiety state, unspecified   . CAD (coronary artery disease)   . Cancer [facility])    2004 Right breast, found on mammogram, radiation therapy, Dr. Bryson Ha, uterine cancer,   . COPD (chronic obstructive pulmonary disease) ([place])   . Diabetes mellitus   . Esophageal reflux   . Heart burn   . Hyperlipidemia   . IBS (irritable bowel syndrome)   . Myocardial infarction [facility])    Cath negative except for 40% occlusion LAD.  Pt not candidate for betablocker or ACEI because of hypotension  . OSA (obstructive sleep apnea)   . Osteoporosis 02/21/09   DEXA scan showed osteoporosis with left femur T-score -2.8.  Marland Kitchen Presbyacusis   . Stroke ([place])   . Thyroid disease    Hypothyroid  . Vitamin D deficiency    Past Surgical History:  Procedure Laterality Date  . ABDOMINAL HYSTERECTOMY     uterine cancer  . BREAST SURGERY    . COLON SURGERY  2013   done at [facility] Reactions  . Penicillins Rash    Has patient had a PCN reaction causing immediate rash, facial/tongue/throat swelling, SOB or lightheadedness with hypotension: No Has patient had a PCN reaction causing severe rash involving mucus membranes or skin necrosis: No Has patient had a PCN reaction that required hospitalization No Has patient had a PCN reaction occurring within the last 10 years: No If all of the above answers are "NO", then may proceed with Cephalosporin use.     Allergies as of 12/02/2016  Reactions   Penicillins Rash   Has patient had a PCN reaction causing immediate rash, facial/tongue/throat swelling, SOB or lightheadedness with hypotension: No Has patient had a PCN reaction causing severe rash involving mucus membranes or skin necrosis: No Has patient had a PCN reaction that required hospitalization No Has patient had a PCN reaction occurring within the last 10 years: No If all of the above answers are "NO", then may proceed with  Cephalosporin use.      Medication List       Accurate as of 12/02/16 11:59 PM. Always use your most recent med list.          aspirin EC 81 MG tablet Take 1 tablet (81 mg total) by mouth daily.   atorvastatin 80 MG tablet Commonly known as:  LIPITOR Take 1 tablet (80 mg total) by mouth daily.   clopidogrel 75 MG tablet Commonly known as:  PLAVIX Take 1 tablet (75 mg total) by mouth daily.   feeding supplement (GLUCERNA SHAKE) Liqd Take 237 mLs by mouth 2 (two) times daily between meals.   fluticasone 50 MCG/ACT nasal spray Commonly known as:  FLONASE Place 2 sprays into both nostrils daily.   glucagon 1 MG injection Inject 1 mg into the vein once as needed.   insulin glargine 100 UNIT/ML injection Commonly known as:  LANTUS Inject 0.14 mLs (14 Units total) into the skin daily.   levofloxacin 750 MG tablet Commonly known as:  LEVAQUIN Take 1 tablet (750 mg total) by mouth daily.   levothyroxine 100 MCG tablet Commonly known as:  SYNTHROID, LEVOTHROID TAKE 1 TABLET(100 MCG) BY MOUTH DAILY   metFORMIN 500 MG tablet Commonly known as:  GLUCOPHAGE TAKE 1 TABLET(500 MG) BY MOUTH TWICE DAILY WITH A MEAL   pantoprazole 40 MG tablet Commonly known as:  PROTONIX Take 1 tablet (40 mg total) by mouth 2 (two) times daily.       Review of Systems  Constitutional: Negative for activity change, appetite change, chills, diaphoresis and fever.  HENT: Negative for congestion, sneezing, sore throat, trouble swallowing and voice change.   Respiratory: Negative for apnea, cough, choking, chest tightness, shortness of breath and wheezing.   Cardiovascular: Negative for chest pain, palpitations and leg swelling.  Gastrointestinal: Negative for abdominal distention, abdominal pain, constipation, diarrhea and nausea.  Genitourinary: Negative for difficulty urinating, dysuria, frequency and urgency.  Musculoskeletal: Negative for back pain, gait problem and myalgias. Arthralgias:  typical arthritis.  Skin: Negative for color change, pallor, rash and wound.  Neurological: Negative for dizziness, tremors, syncope, speech difficulty, weakness, numbness and headaches.  Psychiatric/Behavioral: Positive for confusion. Negative for agitation and behavioral problems.  All other systems reviewed and are negative.   There is no immunization history for the selected administration types on file for this patient. Pertinent  Health Maintenance Due  Topic Date Due  . PNA vac Low Risk Adult (1 of 2 - PCV13) 04/09/2017 (Originally 03/15/1999)  . INFLUENZA VACCINE  02/25/2017  . FOOT EXAM  04/09/2017  . URINE MICROALBUMIN  04/11/2017  . HEMOGLOBIN A1C  05/09/2017  . OPHTHALMOLOGY EXAM  05/19/2017  . DEXA SCAN  Completed   Fall Risk  08/15/2016 04/01/2016 01/04/2016 03/29/2015 03/29/2015  Falls in the past year? No No No (No Data) No  Risk for fall due to : - Impaired balance/gait;Impaired mobility - - -   Functional Status Survey:    Vitals:   12/02/16 0630  BP: 137/61  Pulse: 82  Resp: 20  Temp:  97.7 F (36.5 C)  SpO2: 97%   There is no height or weight on file to calculate BMI. Physical Exam  Constitutional: She is oriented to person, place, and time. Vital signs are normal. She appears well-developed and well-nourished. She is active and cooperative. She does not appear ill. No distress.  HENT:  Head: Normocephalic and atraumatic.  Mouth/Throat: Uvula is midline, oropharynx is clear and moist and mucous membranes are normal. Mucous membranes are not pale, not dry and not cyanotic.  Eyes: Conjunctivae, EOM and lids are normal. Pupils are equal, round, and reactive to light.  Neck: Trachea normal, normal range of motion and full passive range of motion without pain. Neck supple. No JVD present. No tracheal deviation, no edema and no erythema present. No thyromegaly present.  Cardiovascular: Normal rate, regular rhythm, normal heart sounds, intact distal pulses and normal  pulses.  Exam reveals no gallop, no distant heart sounds and no friction rub.   No murmur heard. Pulmonary/Chest: Effort normal and breath sounds normal. No accessory muscle usage. No respiratory distress. She has no wheezes. She has no rales. She exhibits no tenderness.  Abdominal: Normal appearance and bowel sounds are normal. She exhibits no distension and no ascites. There is no tenderness.  Musculoskeletal: Normal range of motion. She exhibits no edema or tenderness.  Expected osteoarthritis, stiffness  Neurological: She is alert and oriented to person, place, and time. She has normal strength.  Skin: Skin is warm, dry and intact. No rash noted. She is not diaphoretic. No cyanosis or erythema. No pallor. Nails show no clubbing.  Psychiatric: She has a normal mood and affect. Her speech is normal and behavior is normal. Thought content normal. Cognition and memory are impaired. She expresses impulsivity. She exhibits abnormal recent memory.  Nursing note and vitals reviewed.   Labs reviewed:  Recent Labs  11/10/16 0504  11/13/16 0447 11/14/16 0511 11/18/16 1100 11/27/16 0724  NA 140  < > 134* 132* 130* 135  K 3.1*  < > 4.4 4.6 4.8 4.1  CL 109  < > 101 97* 97* 100*  CO2 24  < > 28 29 24 27   GLUCOSE 86  < > 271* 268* 444* 98  BUN 10  < > 11 9 14 17   CREATININE 0.76  < > 0.97 0.88 1.27* 1.04*  CALCIUM 8.3*  < > 8.3* 8.3* 8.9 9.6  MG 1.9  --  1.7 1.7  --   --   PHOS 2.1*  --  2.7 3.3  --   --   < > = values in this interval not displayed.  Recent Labs  07/15/16 1228 11/06/16 0008 11/06/16 1619 11/18/16 1100  AST 69* 32  --  33  ALT 45 25  --  20  ALKPHOS 121 112  --  119  BILITOT 0.5 1.9*  --  0.7  PROT 7.5 7.5  --  6.5  ALBUMIN 3.9 3.8 3.3* 3.1*    Recent Labs  07/15/16 1228  11/07/16 0744 11/08/16 0645 11/09/16 0730 11/11/16 1729 11/18/16 1100  WBC 4.1  < > 14.2* 8.2  --   --  5.4  NEUTROABS 2.4  --  12.1*  --   --   --  4.0  HGB 11.7*  < > 9.9* 10.1* 10.3*  9.6* 10.6*  HCT 33.5*  < > 29.4* 29.4*  --   --  31.6*  MCV 95.6  < > 97.1 95.8  --   --  96.6  PLT  243  < > 169 161  --   --  314  < > = values in this interval not displayed. Lab Results  Component Value Date   TSH 2.49 08/20/2016   Lab Results  Component Value Date   HGBA1C 10.2 (H) 11/07/2016   Lab Results  Component Value Date   CHOL 204 (H) 08/20/2016   HDL 110.60 08/20/2016   LDLCALC 79 08/20/2016   LDLDIRECT 138.1 06/20/2013   TRIG 76.0 08/20/2016   CHOLHDL 2 08/20/2016    Significant Diagnostic Results in last 30 days:  Dg Chest 2 View  Result Date: 11/11/2016 CLINICAL DATA:  Confusion.  Elevated blood sugar hemoptysis. EXAM: CHEST  2 VIEW COMPARISON:  Chest x-ray 11/06/2016, 03/11/2016. FINDINGS: Mediastinum hilar structures are normal. Bibasilar and right perihilar infiltrates noted consistent with pneumonia. Small bilateral pleural effusions. Mild cardiomegaly. No pulmonary venous congestion. No pneumothorax. IMPRESSION: 1. Bibasilar and right perihilar infiltrates consistent with pneumonia. Small bilateral pleural effusions. 2. Mild cardiomegaly.  No pulmonary venous congestion. Electronically Signed   By: Marcello Moores  Register   On: 11/11/2016 09:02   Ct Angio Chest Pe W Or Wo Contrast  Result Date: 11/11/2016 CLINICAL DATA:  Hemoptysis earlier today. Diminished breath sounds bilaterally. Altered mental status. EXAM: CT ANGIOGRAPHY CHEST WITH CONTRAST TECHNIQUE: Multidetector CT imaging of the chest was performed using the standard protocol during bolus administration of intravenous contrast. Multiplanar CT image reconstructions and MIPs were obtained to evaluate the vascular anatomy. CONTRAST:  75 mL Isovue 370 IV. COMPARISON:  CT 06/08/2015 and 06/16/2011 FINDINGS: Cardiovascular: Heart size is within normal. There is calcified plaque over the left anterior descending coronary artery. Mild calcified plaque over the thoracic aorta. Pulmonary arterial system is within normal  without evidence of emboli. Mediastinum/Nodes: No evidence of mediastinal or hilar adenopathy. Small hiatal hernia. Lungs/Pleura: Lungs are adequately inflated and demonstrate a small right pleural effusion and tiny amount of left pleural fluid. Mild associated bibasilar atelectasis. Airways are within normal. Upper Abdomen: Calcified plaque over the abdominal aorta. Musculoskeletal: Mild degenerate change of the spine. Review of the MIP images confirms the above findings. IMPRESSION: No evidence of pulmonary embolism. Small right-sided pleural effusion and tiny amount of left pleural fluid with mild bibasilar atelectasis. Mild atherosclerotic coronary artery disease. Aortic atherosclerosis. Electronically Signed   By: Marin Olp M.D.   On: 11/11/2016 20:06    Assessment/Plan 1. Vascular dementia without behavioral disturbance  [facility] [employer] to follow  Safety precautions  Ambulates to BR with walker and 1 assist  2. DM (diabetes mellitus) type II uncontrolled, periph vascular disorder (HCC)  Continue Lantus 22 units Q Day  Continue Novolog 5 units TID with meals  Novolog SSI prn  Carb-modified diet  FSBS AC/HS   Family/ staff Communication:   Total Time:  Documentation:  Face to Face:  Family/Phone:   Labs/tests ordered:    Medication list reviewed and assessed for continued appropriateness. Monthly medication orders reviewed and signed.  Vikki Ports, NP-C Geriatrics [facility] [phone], [place] [address] Cell Phone (Mon-Fri 8am-5pm):  [phone] On Call:  [phone] & follow prompts after 5pm & weekends Office Phone:  [phone] Office Fax:  [phone]

## 2016-12-11 ENCOUNTER — Other Ambulatory Visit: Payer: Self-pay | Admitting: *Deleted

## 2016-12-11 LAB — GLUCOSE, CAPILLARY
GLUCOSE-CAPILLARY: 133 mg/dL — AB (ref 65–99)
Glucose-Capillary: 70 mg/dL (ref 65–99)
Glucose-Capillary: 135 mg/dL — ABNORMAL HIGH (ref 65–99)
Glucose-Capillary: 397 mg/dL — ABNORMAL HIGH (ref 65–99)

## 2016-12-11 NOTE — Patient Outreach (Signed)
[facility]) Care Management  12/11/2016  [name] [name] [birth year] [id number]   Phone call to discharge planner [place] page to follow up on patient's status in rehab and plans made regarding her discharge. Voicemail message left for a return call.    Sheralyn Boatman [facility] Care Management [phone]

## 2016-12-12 ENCOUNTER — Other Ambulatory Visit: Payer: Self-pay | Admitting: *Deleted

## 2016-12-12 NOTE — Patient Outreach (Addendum)
Attempt made to contact pt, follow up on referral received 5/17 from [facility] hospital liaison to follow pt after discharge from [place].   This RN CM was  informed by [facility] LCSW  5/17 instead of pt remaining in an [place] unit at [facility] (admitted 5/16), son from [place] took pt home yesterday.  HIPAA compliant voice message left with contact name and number.    Plan:  RN CM to call pt's granddaughter [name] (on [facility] consent form).    [name]   [place] Management  [phone]  Addendum:  Correction to note for 5/18-  Pt was not at an [place] care unit at [facility].  Granddaughter [name] informed this RN CM pt was at [facility] facility.    [name]   [place] Care Management  [phone]

## 2016-12-12 NOTE — Patient Outreach (Addendum)
[facility]) Care Management  12/12/2016  [name] [name] 09/18/1931 [id number]   Phone call from discharge planner Leonette Nutting of [facility] on 12/11/16 who stated that patient was placed in a [place] unit on 12/10/16. However she received a call from patient's granddaughter reporting  that patient's son from [place] was not happy with the facility aand took her back home.  Per Joelene Millin, patient is now back at home. The discharge planner  re-emphasized the recommendation for 24 hour care for patient. Per patient's granddaughter, they are working on it    Plan: This social worker informed RNCM Kathie Rhodes for transition of care.           This Education officer, museum will follow up with patient to assess for community resource needs.    Sheralyn Boatman [facility] Care Management [phone]

## 2016-12-12 NOTE — Patient Outreach (Signed)
Transition of care call successful, follow up on pt leaving Assisted Living facility 5/17.     Recent hospitalization 4/12-4/20 for Hyperkalemia, Hyperglycemia, acute encephalopathy.   Spoke with pt, HIPAA verified with pt stating name/dob but needed some assistance from son on completing address.   Pt reports stayed at [facility] facility one night, came home did not like it.  Pt reports daughter [name] [name] taking care of her medications, checking sugars myself- most recent sugar was 301, 350 this am.  Pt  reports daughter monitoring my insulin, tells me how much to take.   Pt reports she does not know when she follows up with Primary Care MD.   Pt reports she has to let RN CM go, permission given to speak with son [name] [name] (lives with pt).  [name] [name] reports daughter [name] [name] is suppose to pick up a medication for pt.    Plan:  As discussed with pt's son, to call to pt back (schedule home visit).   [name]   [place] Care Management  [phone]

## 2016-12-12 NOTE — Patient Outreach (Signed)
Transition of care call completed.  Spoke with granddaughter [name] [name] (on consent form), HIPAA verified on pt, discussed purpose of call to follow up with pt post discharge.    Granddaughter reports pt left [facility] and moved to [place], son from [place] called pt and was not happy with facility, called pt's daughter [name] [name] to come and take pt home.   Granddaughter reports she has not been staying with pt,  pt's daughter has been checking on her (monitoring sugars)  plus pt's son who lives with there  [facility] also.   Granddaughter reports son from [place] wants me to stay with pt until they find another place for pt to go.   RN CM discussed with granddaughter attempt made to contact pt, voice message left to which granddaughter reports pt does not always hear the phone, to call son [name] [name]- contact number provided..      Plan:  RN CM to contact son [name] [name].     [name]   [place] Care Management  [phone]

## 2016-12-12 NOTE — Patient Outreach (Signed)
Follow up call completed-  RN CM scheduled with pt plan to  follow up on 5/21- home visit.    [name]   [facility] Care Management  [phone]

## 2016-12-15 ENCOUNTER — Other Ambulatory Visit: Payer: Self-pay | Admitting: *Deleted

## 2016-12-15 ENCOUNTER — Encounter: Payer: Self-pay | Admitting: *Deleted

## 2016-12-15 NOTE — Patient Outreach (Signed)
Attempted to call pt twice to inform her  Dr. Delorise Shiner nurse returned phone call, confirmed MD's insulin orders:  Pt to take Lantus 18 units at night, Novolog 8 units three times a day with meals, Novolog sliding scale as needed.   Both times unable to connect/no answer.    RN CM then called pt's son [name] [name] (resides with pt in the home), voice message left.    Plan: if no response to voice message left, will try again.    [name] [name].   [place] Care Management  [phone]

## 2016-12-15 NOTE — Patient Outreach (Signed)
3:53 pm received a call from [place] (Dr. Delorise Shiner nurse) returning RN CM's call from earlier today- verify pt's insulin orders (recent SNF discharge summary pt to take Lantus 14 units daily, no Novolog).    [place] reports MD's insulin orders for pt should be: Lantus 18 units daily, Novolog 8 units three times a day with meals, and Novolog sliding scale.   RN CM reported to [place] that pt  took 22 units of Lantus last night, sugar this am was 145, during home visit today pt reported being shaky, sugar checked by pt with result of 60, rechecked several times- came up to 118.    Plan:  As discussed with [place]- will follow up with pt to relay Dr. Delorise Shiner insulin orders.            Plan to update pt's medication profile.   [name]   [place] Care Management  [phone]

## 2016-12-15 NOTE — Patient Outreach (Signed)
5:07 pm- received a return phone call from pt's son Laverna Peace (on consent form) to voice message left earlier.  Spoke with Laverna Peace, HIPAA verified on pt.  RN CM relayed to pt's son received a return phone call from Dr. Delorise Shiner nurse- confirmed MD's insulin orders for pt = Pt to take 18 units of Lantus at night, Novolog 8 units three times a day with meals, Novolog sliding scale as needed.  Son voiced understanding as RN CM repeated the Lantus, Novolog dosages plus has sliding scale available if pt needs it.     Plan:  As discussed with pt's son, plan to follow up again next week telephonically (part of ongoing transition of care).   [name]   [place] Care Management  [phone]

## 2016-12-15 NOTE — Patient Outreach (Signed)
Another attempt made to contact pt's son [name] [name] (resides with pt) to confirm  Dr. Delorise Shiner insulin orders-  Pt to take Lantus 18 units at night, Novolog 8 units with each meal, Novolog sliding scale as needed.   HIPAA compliant voice message left with contact name and number.    Plan: if no response, will call again tomorrow.    [name]   [place] Care Management  [phone]

## 2016-12-15 NOTE — Patient Outreach (Signed)
[facility]) Care Management   12/15/2016  [name] [name] 1932/07/02 [id number]  [name] [name] is an 83 y.o. female  Subjective: Pt reports her sugar this am was 145, been checking 3-4 times a day.   Son  Reports pt's granddaughter has discharge papers from SNF, currently at work, handling Pt's medications, checks on pt.   Son reports pt was scheduled to go to [facility] May 1st but went into the hospital, pt need to  start over/ lost her place. Pt reports son has been staying home With her since coming home as was told could not be left alone and son reports great  Granddaughter [name] to start back tomorrow/stay all day.   Son confirmed pt saw [name] 5/10, cannot find the paper work from that visit.  Son reports pt is taking 22 units of Lantus  At night, 8 units of Novolog with meals,Novolog sliding scale as needed showed RN CM  Insulin schedule which revealed pt taking different dosage of Novolog at lunch and supper.     Objective:   Vitals:   12/15/16 1054  BP: (!) 108/56  Pulse: 95  Resp: 16    ROS  Physical Exam  Constitutional: She is oriented to person, place, and time. She appears well-developed and well-nourished.  Cardiovascular: Normal rate, regular rhythm and normal heart sounds.   Respiratory: Effort normal and breath sounds normal.  GI: Soft. Bowel sounds are normal.  Musculoskeletal: She exhibits no edema.  Pt having dizziness  when standing, son with pt 24 hours, walker close by to use.    Neurological: She is alert and oriented to person, place, and time.  Skin: Skin is warm and dry.  Psychiatric: She has a normal mood and affect. Her behavior is normal.  Forgetful.      Encounter Medications:   Outpatient Encounter Prescriptions as of 12/15/2016  Medication Sig Note  . aspirin EC 81 MG tablet Take 1 tablet (81 mg total) by mouth daily.   Marland Kitchen atorvastatin (LIPITOR) 80 MG tablet Take 1 tablet (80 mg total) by mouth daily.   .  clopidogrel (PLAVIX) 75 MG tablet Take 1 tablet (75 mg total) by mouth daily.   . insulin aspart (NOVOLOG) 100 UNIT/ML injection Inject 8 Units into the skin 3 (three) times daily before meals.   . insulin aspart (NOVOLOG) 100 UNIT/ML injection Inject into the skin 3 (three) times daily as needed. Sliding scale:  200-249 take one unit 250-299 take 2 units 300-349 take 3 units 350-399 take 4 units  >400 take 5 units 12/15/2016: As needed.   . insulin glargine (LANTUS) 100 UNIT/ML injection Inject 0.14 mLs (14 Units total) into the skin daily. 12/15/2016: Pt taking differently- 22 units at night.   . levothyroxine (SYNTHROID, LEVOTHROID) 100 MCG tablet TAKE 1 TABLET(100 MCG) BY MOUTH DAILY   . metFORMIN (GLUCOPHAGE) 500 MG tablet TAKE 1 TABLET(500 MG) BY MOUTH TWICE DAILY WITH A MEAL   . pantoprazole (PROTONIX) 40 MG tablet Take 1 tablet (40 mg total) by mouth 2 (two) times daily.   . fluticasone (FLONASE) 50 MCG/ACT nasal spray Place 2 sprays into both nostrils daily.    Marland Kitchen glucagon 1 MG injection Inject 1 mg into the vein once as needed. 12/15/2016: Available if needed.   Marland Kitchen levofloxacin (LEVAQUIN) 750 MG tablet Take 1 tablet (750 mg total) by mouth daily. (Patient not taking: Reported on 12/15/2016)    No facility-administered encounter medications on file as of 12/15/2016.  Functional Status:   In your present state of health, do you have any difficulty performing the following activities: 12/15/2016 11/27/2016  Hearing? Tempie Donning  Vision? N N  Difficulty concentrating or making decisions? Y N  Walking or climbing stairs? N N  Dressing or bathing? N N  Doing errands, shopping? Y N  Preparing Food and eating ? Y Y  Using the Toilet? N N  In the past six months, have you accidently leaked urine? Y N  Do you have problems with loss of bowel control? N N  Managing your Medications? Y N  Managing your Finances? Tempie Donning  Housekeeping or managing your Housekeeping? Y Y  Some recent data might be hidden     Fall/Depression Screening:    Fall Risk  12/15/2016 08/15/2016 04/01/2016  Falls in the past year? No No No  Risk for fall due to : - - Impaired balance/gait;Impaired mobility   PHQ 2/9 Scores 12/15/2016 11/27/2016 08/15/2016 04/01/2016 01/04/2016 08/15/2014 07/26/2012  PHQ - 2 Score 1 3 0 0 0 0 0  PHQ- 9 Score - 8 - - - - -    Assessment:  Pleasant 83 year old woman, forgetful, son resides with her.  Lungs clear, no c/o pain, sob. DM-  During home visit pt reports being shaky- had pt check her sugar result was 60, per  Pt had bacon, eggs, one toast for breakfast, took 8 units of Novolog.   8 oz of Orange juice given And 2 [place] biscuits, rechecked in 15 minutes- result 65, another 8 oz of orange juice with a teaspoon of sugar rechecked in 15 minute - result 71/per pt feels a little better/still shaky. Rechecked in 30 minutes- result 118, son preparing chicken salad sandwich for pt to eat.   Plan:  RN CM called Dr. Delorise Shiner office during home visit, spoke with April, request to  relay message to MD's nurse(message- pt recent SNF discharge/insulin orders pt to take 14 units of Lantus daily, no novolog ordered.  Per son pt taking 22 units of Lantus and Novolog three times a day with meals(8 units at breakfast). Request MD clarify insulin orders (different from pt's office visit with MD 5/10).              As discussed with pt's son, to call back after hearing from Dr. Delorise Shiner office            As discussed with pt and son, continue to follow for transition of care, follow up again Next week telephonically.             Barrier letter sent to Dr. Lacinda Axon informing of [facility] involvement, RN CM also to send                5/21 home visit encounter.                     THN CM Care Plan Problem One     Most Recent Value  Care Plan Problem One  Risk for readmission related to recent SNF, hospitalization, leaving ALF   Role Documenting the Problem One  Care Management [place] for Problem  One  Active  THN Long Term Goal (31-90 days)  Pt will not readmit to the hospital or SNF in the next 31 days   THN Long Term Goal Start Date  12/12/16  Interventions for Problem One Long Term Goal  Home visit done- reviewed mediication   [place] CM Short  Term Goal #1 (0-30 days)  Pt would not have any low blood sugar readings in the next 14 days   THN CM Short Term Goal #1 Start Date  12/15/16  Interventions for Short Term Goal #1  Discussed with pt and son importance of having protein with each meal,snack   THN CM Short Term Goal #2 (0-30 days)  Pt will take all medications as ordered for the next 30 days   THN CM Short Term Goal #2 Start Date  12/15/16  Interventions for Short Term Goal #2  Call to diabetic MD- verify insulin dosages.       [name]   [place] Care Management  [phone]

## 2016-12-16 ENCOUNTER — Other Ambulatory Visit: Payer: Self-pay | Admitting: *Deleted

## 2016-12-16 ENCOUNTER — Other Ambulatory Visit: Payer: Self-pay | Admitting: Family Medicine

## 2016-12-16 DIAGNOSIS — G4733 Obstructive sleep apnea (adult) (pediatric): Secondary | ICD-10-CM | POA: Diagnosis not present

## 2016-12-16 DIAGNOSIS — E785 Hyperlipidemia, unspecified: Secondary | ICD-10-CM | POA: Diagnosis not present

## 2016-12-16 DIAGNOSIS — M810 Age-related osteoporosis without current pathological fracture: Secondary | ICD-10-CM | POA: Diagnosis not present

## 2016-12-16 DIAGNOSIS — D649 Anemia, unspecified: Secondary | ICD-10-CM | POA: Diagnosis not present

## 2016-12-16 DIAGNOSIS — K219 Gastro-esophageal reflux disease without esophagitis: Secondary | ICD-10-CM | POA: Diagnosis not present

## 2016-12-16 DIAGNOSIS — R531 Weakness: Secondary | ICD-10-CM | POA: Diagnosis not present

## 2016-12-16 DIAGNOSIS — E1151 Type 2 diabetes mellitus with diabetic peripheral angiopathy without gangrene: Secondary | ICD-10-CM | POA: Diagnosis not present

## 2016-12-16 DIAGNOSIS — E039 Hypothyroidism, unspecified: Secondary | ICD-10-CM | POA: Diagnosis not present

## 2016-12-16 DIAGNOSIS — J449 Chronic obstructive pulmonary disease, unspecified: Secondary | ICD-10-CM | POA: Diagnosis not present

## 2016-12-16 DIAGNOSIS — I2510 Atherosclerotic heart disease of native coronary artery without angina pectoris: Secondary | ICD-10-CM | POA: Diagnosis not present

## 2016-12-16 DIAGNOSIS — Z794 Long term (current) use of insulin: Secondary | ICD-10-CM | POA: Diagnosis not present

## 2016-12-16 DIAGNOSIS — E1165 Type 2 diabetes mellitus with hyperglycemia: Secondary | ICD-10-CM | POA: Diagnosis not present

## 2016-12-16 DIAGNOSIS — Z8673 Personal history of transient ischemic attack (TIA), and cerebral infarction without residual deficits: Secondary | ICD-10-CM | POA: Diagnosis not present

## 2016-12-16 NOTE — Patient Outreach (Addendum)
[facility]) Care Management  12/16/2016  [name] [name] Dec 19, 1931 [id number]   Phone call to [facility], spoke with Melonie [place] to confirm that referral was still in place for home health despite patient's change in residency. Patient was origionally discharged to [facility], however is now back at home. Per Crystal, they do have the referral, however they did not have patient's current phone number and address. Patient's contact information provided so that [facility] services can begin. Per Crystal, they will try to contact patient to open the case today.  Plan: This social worker to continue to follow patient to assess for community resource needs.   Sheralyn Boatman [facility] Care Management [phone]

## 2016-12-16 NOTE — Patient Outreach (Signed)
Phone call to great granddaughter Criss Alvine (on consent form) as saw tried to contact RN CM earlier.  Spoke with [name], HIPAA verified on pt.  [name] reports she is follow up on Wellbutrin that was on pt's discharge papers from [facility].  [name] reports she called pt's pharmacy and they do not have it plus pt was not taking  this medication prior to her hospitalization.  RN CM discussed with [name] did not have [place] discharge papers at yesterday's home visit as was told by pt no papers given, RN CM went by SNF discharge and Wellbutrin not on discharge medication list.   RN CM discussed with [name] to call Dr. Lacinda Axon office to discuss this plus schedule pt's post hospital follow up visit to which [name] said she would do. [name] reports she will be staying with pt, wanted to review pt's insulin orders to which RN CM informed her of follow up call from Dr. Delorise Shiner nurse yesterday- Pt to take Lantus 18 units at night, Novolog 8 units three times a day with meals, Novolog sliding scale as needed.  Also discussed with [name] pt's low sugars yesterday during home visit, reviewed what to do when sugars are low to which [name] voiced understanding/that is what she has done.  RN CM informed [name] pt's next follow up visit with [name] is 6/21.   Plan:  RN CM to continue to follow pt for transition of care, follow up again next week telephonically.             As discussed, [name] to call Dr. Lacinda Axon about pt  taking Wellbutrin, schedule pt's follow up appointment.  Also, [name] to call Dr. Delorise Shiner office to find out time of pt's appointment on 6/21.   [name]   [place] Care Management  [phone]

## 2016-12-19 ENCOUNTER — Telehealth: Payer: Self-pay | Admitting: *Deleted

## 2016-12-19 DIAGNOSIS — Z794 Long term (current) use of insulin: Secondary | ICD-10-CM | POA: Diagnosis not present

## 2016-12-19 DIAGNOSIS — Z8673 Personal history of transient ischemic attack (TIA), and cerebral infarction without residual deficits: Secondary | ICD-10-CM | POA: Diagnosis not present

## 2016-12-19 DIAGNOSIS — I2510 Atherosclerotic heart disease of native coronary artery without angina pectoris: Secondary | ICD-10-CM | POA: Diagnosis not present

## 2016-12-19 DIAGNOSIS — M810 Age-related osteoporosis without current pathological fracture: Secondary | ICD-10-CM | POA: Diagnosis not present

## 2016-12-19 DIAGNOSIS — K219 Gastro-esophageal reflux disease without esophagitis: Secondary | ICD-10-CM | POA: Diagnosis not present

## 2016-12-19 DIAGNOSIS — E1151 Type 2 diabetes mellitus with diabetic peripheral angiopathy without gangrene: Secondary | ICD-10-CM | POA: Diagnosis not present

## 2016-12-19 DIAGNOSIS — E039 Hypothyroidism, unspecified: Secondary | ICD-10-CM | POA: Diagnosis not present

## 2016-12-19 DIAGNOSIS — R531 Weakness: Secondary | ICD-10-CM | POA: Diagnosis not present

## 2016-12-19 DIAGNOSIS — E1165 Type 2 diabetes mellitus with hyperglycemia: Secondary | ICD-10-CM | POA: Diagnosis not present

## 2016-12-19 DIAGNOSIS — D649 Anemia, unspecified: Secondary | ICD-10-CM | POA: Diagnosis not present

## 2016-12-19 DIAGNOSIS — E785 Hyperlipidemia, unspecified: Secondary | ICD-10-CM | POA: Diagnosis not present

## 2016-12-19 DIAGNOSIS — J449 Chronic obstructive pulmonary disease, unspecified: Secondary | ICD-10-CM | POA: Diagnosis not present

## 2016-12-19 DIAGNOSIS — G4733 Obstructive sleep apnea (adult) (pediatric): Secondary | ICD-10-CM | POA: Diagnosis not present

## 2016-12-19 NOTE — Telephone Encounter (Signed)
Spoke with [name] [name] from [facility], gave verbal order for PT.  Scott need note that she will have occupation therapy and nursing also from [facility].

## 2016-12-19 NOTE — Telephone Encounter (Signed)
It is okay to give verbal orders for physical therapy. I reviewed the most recent rehabilitation note and her discharge summary. There is no mention of Wellbutrin in either note. Please determine if they have this medication at home and if she has been taking it. I can't find any documentation of when this was started. Thanks.

## 2016-12-19 NOTE — Telephone Encounter (Signed)
Patient was initially hospitalized on 4/12-4/20.  Was discharged to a skill nursing facility and then discharged from there to [facility], only spent one night there and wanted to go home.  Has family at home with her.  They are wanting PT orders for follow up.  Regarding the medication, Wellbutrin.  I don't see that on the hospital encounters or even the skilled nursing orders/visits.  Per the notes from [facility] family even called the pharmacy and it is not on the patients medication list.  Please advise for PT orders and Wellbutrin? thanks

## 2016-12-19 NOTE — Telephone Encounter (Signed)
Scott from [facility] requested verbal orders for Physical Therapy  Please contact [phone]  Patients family also questioned if she should take be taking Wellbutrin , this medication was started while pt was in a facility rehab.

## 2016-12-23 ENCOUNTER — Other Ambulatory Visit: Payer: Self-pay | Admitting: *Deleted

## 2016-12-23 ENCOUNTER — Telehealth: Payer: Self-pay | Admitting: Family Medicine

## 2016-12-23 DIAGNOSIS — D649 Anemia, unspecified: Secondary | ICD-10-CM | POA: Diagnosis not present

## 2016-12-23 DIAGNOSIS — M810 Age-related osteoporosis without current pathological fracture: Secondary | ICD-10-CM | POA: Diagnosis not present

## 2016-12-23 DIAGNOSIS — G4733 Obstructive sleep apnea (adult) (pediatric): Secondary | ICD-10-CM | POA: Diagnosis not present

## 2016-12-23 DIAGNOSIS — I2510 Atherosclerotic heart disease of native coronary artery without angina pectoris: Secondary | ICD-10-CM | POA: Diagnosis not present

## 2016-12-23 DIAGNOSIS — E785 Hyperlipidemia, unspecified: Secondary | ICD-10-CM | POA: Diagnosis not present

## 2016-12-23 DIAGNOSIS — E039 Hypothyroidism, unspecified: Secondary | ICD-10-CM | POA: Diagnosis not present

## 2016-12-23 DIAGNOSIS — Z8673 Personal history of transient ischemic attack (TIA), and cerebral infarction without residual deficits: Secondary | ICD-10-CM | POA: Diagnosis not present

## 2016-12-23 DIAGNOSIS — J449 Chronic obstructive pulmonary disease, unspecified: Secondary | ICD-10-CM | POA: Diagnosis not present

## 2016-12-23 DIAGNOSIS — E1165 Type 2 diabetes mellitus with hyperglycemia: Secondary | ICD-10-CM | POA: Diagnosis not present

## 2016-12-23 DIAGNOSIS — E1151 Type 2 diabetes mellitus with diabetic peripheral angiopathy without gangrene: Secondary | ICD-10-CM | POA: Diagnosis not present

## 2016-12-23 DIAGNOSIS — R531 Weakness: Secondary | ICD-10-CM | POA: Diagnosis not present

## 2016-12-23 DIAGNOSIS — K219 Gastro-esophageal reflux disease without esophagitis: Secondary | ICD-10-CM | POA: Diagnosis not present

## 2016-12-23 DIAGNOSIS — Z794 Long term (current) use of insulin: Secondary | ICD-10-CM | POA: Diagnosis not present

## 2016-12-23 NOTE — Telephone Encounter (Signed)
Left message for family to return my call to check on the wellbutrin, thanks

## 2016-12-23 NOTE — Telephone Encounter (Signed)
Schedule next available appt with Dr. Lacinda Axon, thanks

## 2016-12-23 NOTE — Patient Outreach (Signed)
Attempt made to contact pt as part of ongoing transition of care, recent ALF- pt left 5/17, recent SNF discharge ,recent  hospitalization 4/12-4/20 for Hyperkalemia, Hyperglycemia, acute Encephalopathy.  HIPAA compliant voice message left with contact name and number.   Plan:  If no response, plan to follow up again within next 2 days.    [name]   [place] Care Management  [phone]

## 2016-12-23 NOTE — Telephone Encounter (Signed)
Please advise, thanks.

## 2016-12-23 NOTE — Telephone Encounter (Signed)
Needs to see me and/or Endo.

## 2016-12-23 NOTE — Telephone Encounter (Signed)
[name] [name] from [facility] called and stated that the patient has a hypoglycemic incident this morning. It dropped down to 47. States this the the fourth time in a week and a half. Please advise, thank you!  Call Esther @ [phone].

## 2016-12-23 NOTE — Telephone Encounter (Signed)
[name] [name] called back stating pt blood sugar went up 299.

## 2016-12-24 NOTE — Telephone Encounter (Signed)
Ok Pt is already scheduled for 01/01/2017 that is the closets appt avail. Thank you!

## 2016-12-25 ENCOUNTER — Other Ambulatory Visit: Payer: Self-pay | Admitting: *Deleted

## 2016-12-25 ENCOUNTER — Telehealth: Payer: Self-pay | Admitting: *Deleted

## 2016-12-25 DIAGNOSIS — Z794 Long term (current) use of insulin: Secondary | ICD-10-CM | POA: Diagnosis not present

## 2016-12-25 DIAGNOSIS — K219 Gastro-esophageal reflux disease without esophagitis: Secondary | ICD-10-CM | POA: Diagnosis not present

## 2016-12-25 DIAGNOSIS — E039 Hypothyroidism, unspecified: Secondary | ICD-10-CM | POA: Diagnosis not present

## 2016-12-25 DIAGNOSIS — G4733 Obstructive sleep apnea (adult) (pediatric): Secondary | ICD-10-CM | POA: Diagnosis not present

## 2016-12-25 DIAGNOSIS — E1165 Type 2 diabetes mellitus with hyperglycemia: Secondary | ICD-10-CM | POA: Diagnosis not present

## 2016-12-25 DIAGNOSIS — J449 Chronic obstructive pulmonary disease, unspecified: Secondary | ICD-10-CM | POA: Diagnosis not present

## 2016-12-25 DIAGNOSIS — E1151 Type 2 diabetes mellitus with diabetic peripheral angiopathy without gangrene: Secondary | ICD-10-CM | POA: Diagnosis not present

## 2016-12-25 DIAGNOSIS — I2510 Atherosclerotic heart disease of native coronary artery without angina pectoris: Secondary | ICD-10-CM | POA: Diagnosis not present

## 2016-12-25 DIAGNOSIS — E785 Hyperlipidemia, unspecified: Secondary | ICD-10-CM | POA: Diagnosis not present

## 2016-12-25 DIAGNOSIS — D649 Anemia, unspecified: Secondary | ICD-10-CM | POA: Diagnosis not present

## 2016-12-25 DIAGNOSIS — M810 Age-related osteoporosis without current pathological fracture: Secondary | ICD-10-CM | POA: Diagnosis not present

## 2016-12-25 DIAGNOSIS — I63312 Cerebral infarction due to thrombosis of left middle cerebral artery: Secondary | ICD-10-CM

## 2016-12-25 DIAGNOSIS — R531 Weakness: Secondary | ICD-10-CM | POA: Diagnosis not present

## 2016-12-25 DIAGNOSIS — Z8673 Personal history of transient ischemic attack (TIA), and cerebral infarction without residual deficits: Secondary | ICD-10-CM | POA: Diagnosis not present

## 2016-12-25 NOTE — Telephone Encounter (Signed)
Faxed to the number provided, thanks

## 2016-12-25 NOTE — Telephone Encounter (Signed)
Ms Mel Almond called back returning your call. Thank you!

## 2016-12-25 NOTE — Telephone Encounter (Signed)
Attempted to reach [facility], not able to leave a VM.

## 2016-12-25 NOTE — Telephone Encounter (Signed)
Please advise, thanks.

## 2016-12-25 NOTE — Telephone Encounter (Signed)
Can we give a verbal for this?  

## 2016-12-25 NOTE — Telephone Encounter (Signed)
DME made, awaiting PCP signature

## 2016-12-25 NOTE — Telephone Encounter (Signed)
[name] [name] from [place] home care has requested a Rx for a rolling walker, pt's current walker is to tall. Primitivo Gauze [phone] Fax [phone]

## 2016-12-25 NOTE — Patient Outreach (Signed)
Transition of care call successful, ongoing follow up on recent ALF stay- left 5/17, recent SNF discharge/recent hospitalization 4/12-10/30/18 for hyperkalemia, hyperglycemia, acute encephalopathy.   Spoke with pt, HIPAA verified.  Pt reports her sugars are up and down, sugar right now is 240.    Pt reports [name] [name] (great granddaughter) staying with me, monitoring my sugars, requested RN CM talk to her about her sugars.  Spoke with [name] [name](on consent form) who reports pt's had some low readings- between lunch and dinner (60, 47), seem to have more lows than highs.  [name] [name] reports [place] OT called MD's office, no return call.   [name] [name] reports the reason pt's sugar was so high today is she  did not take her Lantus last night, stays with pt during the day,prepared Lantus insulin. [name] [name] reports she did not call [name], difficult to contact in the past, to see Dr. Lacinda Axon 6/7 and [name] 6/21.   RN CM discussed with [name] [name] to call [name] again, report low readings to which she said she would do.     Plan:  As discussed with [name] [name], plan to follow again with pt next week telephonically.  [name] [name].   [place] Care Management  [phone]

## 2016-12-27 DIAGNOSIS — Z794 Long term (current) use of insulin: Secondary | ICD-10-CM | POA: Diagnosis not present

## 2016-12-27 DIAGNOSIS — Z8673 Personal history of transient ischemic attack (TIA), and cerebral infarction without residual deficits: Secondary | ICD-10-CM | POA: Diagnosis not present

## 2016-12-27 DIAGNOSIS — E785 Hyperlipidemia, unspecified: Secondary | ICD-10-CM | POA: Diagnosis not present

## 2016-12-27 DIAGNOSIS — G4733 Obstructive sleep apnea (adult) (pediatric): Secondary | ICD-10-CM | POA: Diagnosis not present

## 2016-12-27 DIAGNOSIS — E1151 Type 2 diabetes mellitus with diabetic peripheral angiopathy without gangrene: Secondary | ICD-10-CM | POA: Diagnosis not present

## 2016-12-27 DIAGNOSIS — J449 Chronic obstructive pulmonary disease, unspecified: Secondary | ICD-10-CM | POA: Diagnosis not present

## 2016-12-27 DIAGNOSIS — D649 Anemia, unspecified: Secondary | ICD-10-CM | POA: Diagnosis not present

## 2016-12-27 DIAGNOSIS — M810 Age-related osteoporosis without current pathological fracture: Secondary | ICD-10-CM | POA: Diagnosis not present

## 2016-12-27 DIAGNOSIS — E1165 Type 2 diabetes mellitus with hyperglycemia: Secondary | ICD-10-CM | POA: Diagnosis not present

## 2016-12-27 DIAGNOSIS — R531 Weakness: Secondary | ICD-10-CM | POA: Diagnosis not present

## 2016-12-27 DIAGNOSIS — K219 Gastro-esophageal reflux disease without esophagitis: Secondary | ICD-10-CM | POA: Diagnosis not present

## 2016-12-27 DIAGNOSIS — E039 Hypothyroidism, unspecified: Secondary | ICD-10-CM | POA: Diagnosis not present

## 2016-12-27 DIAGNOSIS — I2510 Atherosclerotic heart disease of native coronary artery without angina pectoris: Secondary | ICD-10-CM | POA: Diagnosis not present

## 2016-12-29 ENCOUNTER — Telehealth: Payer: Self-pay | Admitting: Family Medicine

## 2016-12-29 DIAGNOSIS — D649 Anemia, unspecified: Secondary | ICD-10-CM | POA: Diagnosis not present

## 2016-12-29 DIAGNOSIS — G4733 Obstructive sleep apnea (adult) (pediatric): Secondary | ICD-10-CM | POA: Diagnosis not present

## 2016-12-29 DIAGNOSIS — Z794 Long term (current) use of insulin: Secondary | ICD-10-CM | POA: Diagnosis not present

## 2016-12-29 DIAGNOSIS — E1165 Type 2 diabetes mellitus with hyperglycemia: Secondary | ICD-10-CM | POA: Diagnosis not present

## 2016-12-29 DIAGNOSIS — J449 Chronic obstructive pulmonary disease, unspecified: Secondary | ICD-10-CM | POA: Diagnosis not present

## 2016-12-29 DIAGNOSIS — E039 Hypothyroidism, unspecified: Secondary | ICD-10-CM | POA: Diagnosis not present

## 2016-12-29 DIAGNOSIS — E785 Hyperlipidemia, unspecified: Secondary | ICD-10-CM | POA: Diagnosis not present

## 2016-12-29 DIAGNOSIS — M810 Age-related osteoporosis without current pathological fracture: Secondary | ICD-10-CM | POA: Diagnosis not present

## 2016-12-29 DIAGNOSIS — R531 Weakness: Secondary | ICD-10-CM | POA: Diagnosis not present

## 2016-12-29 DIAGNOSIS — K219 Gastro-esophageal reflux disease without esophagitis: Secondary | ICD-10-CM | POA: Diagnosis not present

## 2016-12-29 DIAGNOSIS — E1151 Type 2 diabetes mellitus with diabetic peripheral angiopathy without gangrene: Secondary | ICD-10-CM | POA: Diagnosis not present

## 2016-12-29 DIAGNOSIS — Z8673 Personal history of transient ischemic attack (TIA), and cerebral infarction without residual deficits: Secondary | ICD-10-CM | POA: Diagnosis not present

## 2016-12-29 DIAGNOSIS — I2510 Atherosclerotic heart disease of native coronary artery without angina pectoris: Secondary | ICD-10-CM | POA: Diagnosis not present

## 2016-12-29 NOTE — Telephone Encounter (Signed)
Does Dr. Jonathon Jordan patient need to got to ER for high blood sugars... Dr. Lacinda Axon is out of the office and this needs to be handled today?

## 2016-12-29 NOTE — Telephone Encounter (Signed)
PT Mel Almond notified and will let Mrs. Diemer caregivers know

## 2016-12-29 NOTE — Telephone Encounter (Signed)
Mel Almond [phone] called from [facility] regarding pt her blood sugar it was 600 this morning rechecked 366 and PT arrived it was 298 at 2:00pm. Pt refused to go to hospital and did not want the PT to call our office. Thank you!

## 2016-12-29 NOTE — Telephone Encounter (Signed)
If her sugar has come down to 298 she does not need to go to the hospital or the emergency room unless she is having symptoms such as nausea, vomiting, confusion, sweatiness, or abdominal pain or if her sugar has gone back up. If she is having those symptoms she should be evaluated. If she continues to have issues with her sugars going up above 350-400 she should be evaluated or if she develops symptoms she should be evaluated. Thanks.

## 2016-12-29 NOTE — Telephone Encounter (Signed)
LMOM for Mel Almond at [facility] to call back

## 2016-12-30 ENCOUNTER — Other Ambulatory Visit: Payer: Self-pay | Admitting: *Deleted

## 2016-12-30 DIAGNOSIS — E1151 Type 2 diabetes mellitus with diabetic peripheral angiopathy without gangrene: Secondary | ICD-10-CM | POA: Diagnosis not present

## 2016-12-30 DIAGNOSIS — E785 Hyperlipidemia, unspecified: Secondary | ICD-10-CM | POA: Diagnosis not present

## 2016-12-30 DIAGNOSIS — D649 Anemia, unspecified: Secondary | ICD-10-CM | POA: Diagnosis not present

## 2016-12-30 DIAGNOSIS — M810 Age-related osteoporosis without current pathological fracture: Secondary | ICD-10-CM | POA: Diagnosis not present

## 2016-12-30 DIAGNOSIS — E1165 Type 2 diabetes mellitus with hyperglycemia: Secondary | ICD-10-CM | POA: Diagnosis not present

## 2016-12-30 DIAGNOSIS — G4733 Obstructive sleep apnea (adult) (pediatric): Secondary | ICD-10-CM | POA: Diagnosis not present

## 2016-12-30 DIAGNOSIS — K219 Gastro-esophageal reflux disease without esophagitis: Secondary | ICD-10-CM | POA: Diagnosis not present

## 2016-12-30 DIAGNOSIS — Z794 Long term (current) use of insulin: Secondary | ICD-10-CM | POA: Diagnosis not present

## 2016-12-30 DIAGNOSIS — R531 Weakness: Secondary | ICD-10-CM | POA: Diagnosis not present

## 2016-12-30 DIAGNOSIS — Z8673 Personal history of transient ischemic attack (TIA), and cerebral infarction without residual deficits: Secondary | ICD-10-CM | POA: Diagnosis not present

## 2016-12-30 DIAGNOSIS — I2510 Atherosclerotic heart disease of native coronary artery without angina pectoris: Secondary | ICD-10-CM | POA: Diagnosis not present

## 2016-12-30 DIAGNOSIS — J449 Chronic obstructive pulmonary disease, unspecified: Secondary | ICD-10-CM | POA: Diagnosis not present

## 2016-12-30 DIAGNOSIS — E039 Hypothyroidism, unspecified: Secondary | ICD-10-CM | POA: Diagnosis not present

## 2016-12-30 NOTE — Patient Outreach (Signed)
Transition of care call successful, ongoing follow up on recent ALF stay- left 5/17, recent SNF discharge/recent hospitalization 4/12 to 4/20 for hyperkalemia,hyperglycemia, acute encephalopathy.  Spoke with pt,HIPAA verified with a little assistance with address from son in the background.  Pt reports sugar today was good 178 before eating, yesterday in am was 600, MD was not called- brought it down with water, asked [name] (great granddaughter) who was present in the home the results- could hear [name] in the background say yesterday at lunch sugar was 389 and at night 289, reason for elevated sugar in am was pt forgot to take her Lantus  the night before.  Pt reports to see Dr. Lacinda Axon tomorrow and [name] (Endocrinologist) 6/21.    Pt reports staying with diabetic diet.     Plan:  As discussed with pt, plan to follow up again next week telephonically.   [name]   [place] Care Management  [phone]

## 2016-12-31 DIAGNOSIS — Z8673 Personal history of transient ischemic attack (TIA), and cerebral infarction without residual deficits: Secondary | ICD-10-CM | POA: Diagnosis not present

## 2016-12-31 DIAGNOSIS — J449 Chronic obstructive pulmonary disease, unspecified: Secondary | ICD-10-CM | POA: Diagnosis not present

## 2016-12-31 DIAGNOSIS — I2510 Atherosclerotic heart disease of native coronary artery without angina pectoris: Secondary | ICD-10-CM | POA: Diagnosis not present

## 2016-12-31 DIAGNOSIS — D649 Anemia, unspecified: Secondary | ICD-10-CM | POA: Diagnosis not present

## 2016-12-31 DIAGNOSIS — K219 Gastro-esophageal reflux disease without esophagitis: Secondary | ICD-10-CM | POA: Diagnosis not present

## 2016-12-31 DIAGNOSIS — M810 Age-related osteoporosis without current pathological fracture: Secondary | ICD-10-CM | POA: Diagnosis not present

## 2016-12-31 DIAGNOSIS — E785 Hyperlipidemia, unspecified: Secondary | ICD-10-CM | POA: Diagnosis not present

## 2016-12-31 DIAGNOSIS — R531 Weakness: Secondary | ICD-10-CM | POA: Diagnosis not present

## 2016-12-31 DIAGNOSIS — E1165 Type 2 diabetes mellitus with hyperglycemia: Secondary | ICD-10-CM | POA: Diagnosis not present

## 2016-12-31 DIAGNOSIS — Z794 Long term (current) use of insulin: Secondary | ICD-10-CM | POA: Diagnosis not present

## 2016-12-31 DIAGNOSIS — G4733 Obstructive sleep apnea (adult) (pediatric): Secondary | ICD-10-CM | POA: Diagnosis not present

## 2016-12-31 DIAGNOSIS — E039 Hypothyroidism, unspecified: Secondary | ICD-10-CM | POA: Diagnosis not present

## 2016-12-31 DIAGNOSIS — E1151 Type 2 diabetes mellitus with diabetic peripheral angiopathy without gangrene: Secondary | ICD-10-CM | POA: Diagnosis not present

## 2017-01-01 ENCOUNTER — Ambulatory Visit (INDEPENDENT_AMBULATORY_CARE_PROVIDER_SITE_OTHER): Payer: Medicare Other | Admitting: Family Medicine

## 2017-01-01 ENCOUNTER — Encounter: Payer: Self-pay | Admitting: Family Medicine

## 2017-01-01 DIAGNOSIS — E1151 Type 2 diabetes mellitus with diabetic peripheral angiopathy without gangrene: Secondary | ICD-10-CM

## 2017-01-01 DIAGNOSIS — E1165 Type 2 diabetes mellitus with hyperglycemia: Secondary | ICD-10-CM | POA: Diagnosis not present

## 2017-01-01 DIAGNOSIS — E785 Hyperlipidemia, unspecified: Secondary | ICD-10-CM

## 2017-01-01 DIAGNOSIS — E039 Hypothyroidism, unspecified: Secondary | ICD-10-CM | POA: Diagnosis not present

## 2017-01-01 DIAGNOSIS — IMO0002 Reserved for concepts with insufficient information to code with codable children: Secondary | ICD-10-CM

## 2017-01-01 MED ORDER — LEVOTHYROXINE SODIUM 100 MCG PO TABS: ORAL_TABLET | ORAL | 3 refills | Status: AC

## 2017-01-01 MED ORDER — FLUTICASONE PROPIONATE 50 MCG/ACT NA SUSP: 2.0000 | Freq: Every day | NASAL | 6 refills | Status: AC

## 2017-01-01 MED ORDER — CLOPIDOGREL BISULFATE 75 MG PO TABS: ORAL_TABLET | ORAL | 3 refills | Status: AC

## 2017-01-01 NOTE — Patient Instructions (Signed)
Continue your meds.  Follow up with my pharmacist in the next 2 weeks to 1 month.  Take care  Dr. Lacinda Axon

## 2017-01-02 NOTE — Progress Notes (Signed)
Subjective:  Patient ID: [name] [name], female    DOB: 05-04-1932  Age: 83 y.o. MRN: [id number]  CC: Follow up  HPI:  83 year old female with a collocated past medical history including stroke, peripheral artery disease, uncontrolled type 2 diabetes, CAD, hypothyroidism, CKD, vascular dementia, hyperlipidemia presents for follow-up.  DM 2  Remains uncontrolled.  Last A1c was 10.2 in April.  Sugars very labile. Prone to hypoglycemia. [name] had frequent hypoglycemic episodes recently.  Sees endocrinology. Currently on 18 units of Lantus daily at bedtime and NovoLog 8 units with meals and sliding scale insulin.  Hyperlipidemia  Last LDL was 79.  She is on maximum dose statin therapy.  Hypothyroidism  Stable currently on Synthroid 100 MCG.  Needs refill today.  Social Hx   Social History   Social History  . Marital status: Single    Spouse name: N/A  . Number of children: N/A  . Years of education: N/A   Occupational History  . retired    Social History Main Topics  . Smoking status: Never Smoker  . Smokeless tobacco: Never Used  . Alcohol use No  . Drug use: No  . Sexual activity: Not Asked   Other Topics Concern  . None   Social History Narrative  . None    Review of Systems  Constitutional: Positive for appetite change.  Endocrine:       Hypoglycemia.    Objective:  BP (!) 80/40   Pulse 91   Temp 98.4 F (36.9 C) (Oral)   Resp 16   Wt 138 lb 8 oz (62.8 kg)   SpO2 98%   BMI 22.35 kg/m   BP/Weight 01/01/2017 12/15/2016 0/92/3300  Systolic BP 80 762 98  Diastolic BP 40 56 67  Wt. (Lbs) 138.5 137 -  BMI 22.35 22.11 -    Physical Exam  Constitutional: She appears well-developed. No distress.  Cardiovascular: Normal rate and regular rhythm.   Pulmonary/Chest: Effort normal and breath sounds normal. [name] no wheezes. [name] no rales.  Neurological: She is alert.  Psychiatric: [name] a normal mood and affect.  Vitals reviewed.   Lab  Results  Component Value Date   WBC 5.4 11/18/2016   HGB 10.6 (L) 11/18/2016   HCT 31.6 (L) 11/18/2016   PLT 314 11/18/2016   GLUCOSE 98 11/27/2016   CHOL 204 (H) 08/20/2016   TRIG 76.0 08/20/2016   HDL 110.60 08/20/2016   LDLDIRECT 138.1 06/20/2013   LDLCALC 79 08/20/2016   ALT 20 11/18/2016   AST 33 11/18/2016   NA 135 11/27/2016   K 4.1 11/27/2016   CL 100 (L) 11/27/2016   CREATININE 1.04 (H) 11/27/2016   BUN 17 11/27/2016   CO2 27 11/27/2016   TSH 2.49 08/20/2016   INR 1.15 11/06/2016   HGBA1C 10.2 (H) 11/07/2016   MICROALBUR 1.3 04/11/2016    Assessment & Plan:   Problem List Items Addressed This Visit      Cardiovascular and Mediastinum   DM (diabetes mellitus) type II uncontrolled, periph vascular disorder ([place])    Uncontrolled. [name] never been controlled. Follows with Endo and this doesn't seem to be improving her glycemic control. Continue current regimen. Caregiver to document sugars closely so that adjustments can be made. She will see our pharmacist in 2 weeks.        Endocrine   Hypothyroidism    Stable. Continue current dose of Synthroid. Refilled today.      Relevant Medications   levothyroxine (  SYNTHROID, LEVOTHROID) 100 MCG tablet     Other   Hyperlipidemia with target LDL less than 70    Nearly at goal. Continue current statin therapy.         Meds ordered this encounter  Medications  . ONE TOUCH ULTRA TEST test strip    Sig: U QID UTD    Refill:  3  . DISCONTD: buPROPion (WELLBUTRIN) 75 MG tablet    Sig: Take 75 mg by mouth 2 (two) times daily.   . clopidogrel (PLAVIX) 75 MG tablet    Sig: TAKE 1 TABLET(75 MG) BY MOUTH DAILY    Dispense:  90 tablet    Refill:  3  . levothyroxine (SYNTHROID, LEVOTHROID) 100 MCG tablet    Sig: TAKE 1 TABLET(100 MCG) BY MOUTH DAILY    Dispense:  90 tablet    Refill:  3  . fluticasone (FLONASE) 50 MCG/ACT nasal spray    Sig: Place 2 sprays into both nostrils daily.    Dispense:  16 g    Refill:   6   Follow-up: Return in about 2 weeks (around 01/15/2017).  [place]

## 2017-01-02 NOTE — Assessment & Plan Note (Signed)
Stable. Continue current dose of Synthroid. Refilled today.

## 2017-01-02 NOTE — Assessment & Plan Note (Signed)
Uncontrolled. Has never been controlled. Follows with Endo and this doesn't seem to be improving her glycemic control. Continue current regimen. Caregiver to document sugars closely so that adjustments can be made. She will see our pharmacist in 2 weeks.

## 2017-01-02 NOTE — Assessment & Plan Note (Signed)
Nearly at goal. Continue current statin therapy.

## 2017-01-05 ENCOUNTER — Telehealth: Payer: Self-pay | Admitting: Family Medicine

## 2017-01-05 DIAGNOSIS — J449 Chronic obstructive pulmonary disease, unspecified: Secondary | ICD-10-CM | POA: Diagnosis not present

## 2017-01-05 DIAGNOSIS — E1165 Type 2 diabetes mellitus with hyperglycemia: Secondary | ICD-10-CM | POA: Diagnosis not present

## 2017-01-05 DIAGNOSIS — D649 Anemia, unspecified: Secondary | ICD-10-CM | POA: Diagnosis not present

## 2017-01-05 DIAGNOSIS — M810 Age-related osteoporosis without current pathological fracture: Secondary | ICD-10-CM | POA: Diagnosis not present

## 2017-01-05 DIAGNOSIS — K219 Gastro-esophageal reflux disease without esophagitis: Secondary | ICD-10-CM | POA: Diagnosis not present

## 2017-01-05 DIAGNOSIS — E785 Hyperlipidemia, unspecified: Secondary | ICD-10-CM | POA: Diagnosis not present

## 2017-01-05 DIAGNOSIS — G4733 Obstructive sleep apnea (adult) (pediatric): Secondary | ICD-10-CM | POA: Diagnosis not present

## 2017-01-05 DIAGNOSIS — E039 Hypothyroidism, unspecified: Secondary | ICD-10-CM | POA: Diagnosis not present

## 2017-01-05 DIAGNOSIS — I2510 Atherosclerotic heart disease of native coronary artery without angina pectoris: Secondary | ICD-10-CM | POA: Diagnosis not present

## 2017-01-05 DIAGNOSIS — E1151 Type 2 diabetes mellitus with diabetic peripheral angiopathy without gangrene: Secondary | ICD-10-CM | POA: Diagnosis not present

## 2017-01-05 DIAGNOSIS — Z8673 Personal history of transient ischemic attack (TIA), and cerebral infarction without residual deficits: Secondary | ICD-10-CM | POA: Diagnosis not present

## 2017-01-05 DIAGNOSIS — Z794 Long term (current) use of insulin: Secondary | ICD-10-CM | POA: Diagnosis not present

## 2017-01-05 DIAGNOSIS — R531 Weakness: Secondary | ICD-10-CM | POA: Diagnosis not present

## 2017-01-05 NOTE — Telephone Encounter (Signed)
Spoke with [name] , great granddaughter and advised of below, she states patient is napping .  She will check sugar again at 2:00pm.  Patient denies abdominal pain, vomiting or abdominal pain, spoke to patient and she can speak clearly, she isn't shaky denies weakness .  Patient declines to go to urgent care.  Spoke with [name] and will re-check blood sugar and call us back with readings.  If blood sugar spike she call EMS.

## 2017-01-05 NOTE — Telephone Encounter (Signed)
Left voice mail to call back for patient on home number .

## 2017-01-05 NOTE — Telephone Encounter (Signed)
[name] [name] from [facility] called and stated that at around 8:30 pt took blood sugar and it was 600, about 2 hours later when she arrived the pt's blood sugar dropped down to 453. [name] [name] advised pt that she needs to go to the ED as she was having symptoms of drowsiness and being shaky, pt refused. Pt lives with son but has had a stroke and really unable to help her. Pt's grandson's girlfriend stay with pt during the week and stated that there is no one with the patient over the weekend and her blood sugar sky rockets. [name] [name] is looking to see if [name] [name] could put in an order for a social worker, she also needs the rx for the walker to be resent. Please advise, thank you!  Call [place] @ [phone]

## 2017-01-05 NOTE — Telephone Encounter (Signed)
I would advise evaluation if she's feeling weak and shaky. If she has nausea, vomiting, confusion, abdominal pain she needs to go to the emergency room. If she continues to feel weak and shaky she should be seen at an urgent care. If she refuses these things I would suggest that she check her sugar again to see if it is continuing to come down and then contact us to let us know what it is.

## 2017-01-05 NOTE — Telephone Encounter (Addendum)
Reason for call: blood sugar now 400 at 12:12pm  Symptoms: feels weak, physical therapist just left , past history of stroke,  Duration today Medications: took 13 units of Insulin when blood sugar was 600 this am  , Home Health RN advised to go to ED patient refuses, patient is getting color back, patient able to speak, Vitals per Home health RN Mel Almond  blood pressure 102/56 temp-96.7, shaky, drowsy,  Last seen for this problem:01/01/17 Seen by: Dr Ramond Marrow with [name] great grandaughter Blood sugar readings was high majority of weekend , not sure if she missed any insulin this weekend , not sure if she took pm insulin last night  Blood sugar Sunday pm 246 Blood reading for Fir  208 FBS 830am Post pranidal 108  Bllood sugar 1200pm Blood sugar 236 1230pm B381 530pm 309 730pm   Patient declines to go to ED, please advise

## 2017-01-05 NOTE — Telephone Encounter (Signed)
Left voice mail to call back for [facility] [phone]

## 2017-01-05 NOTE — Telephone Encounter (Signed)
Pt's grandson called back returning your call. He states that he has a client at 2 and 3 and will try to answer his phone. Please advise, thank you!  Call Tim @ [phone]

## 2017-01-06 NOTE — Telephone Encounter (Signed)
Left voice mail to call back at home number

## 2017-01-06 NOTE — Telephone Encounter (Signed)
Spoke with [name] [name] yesterday blood sugar came down all day yesterday.  Blood sugar was last night was 161. This am 181 fasting .  Patient is feeling fine today.   Patients granddaughter also question if order has been placed for walker or shower bench physical therapy have orders been placed for this already?

## 2017-01-06 NOTE — Telephone Encounter (Signed)
Pt granddaughter [name] called back.   Call [facility] @ [phone]

## 2017-01-07 ENCOUNTER — Other Ambulatory Visit: Payer: Self-pay | Admitting: *Deleted

## 2017-01-07 NOTE — Patient Outreach (Signed)
Transition of care call successful, ongoing follow up on leaving ALF 5/17, recent SNF discharge/recent hospitalization 4/12-4/20 for hyperkalemia, hyperglycemia, acute encephalopathy.  Spoke with [name] (on consent form, per pt considers  great granddaughter).  [name] reports on pt's recent sugars- over the weekend went up, on 6/11 was 600, came down slowly, yesterday was a good day.  [name] reports she woke up this am sick, son Laverna Peace is with pt today.  [name] reports pt was suppose to see [name] today, called to reschedule/see if pt can be seen this week, waiting for a return phone call.   [name] reports most of pt's high sugars occur on the weekends (daughter Kenney Houseman there on the weekends) good during the week when she is there except one day last week at lunch- 43.    [name] reports she has been talking to [facility] PT about getting pt on Medicaid- aide help.   [name] reports on recent visit with Dr. Lacinda Axon, MD to monitor pt's sugar more/pt instructed to check sugars  more often/pt has a medication consult at MD office 6/25.     Plan:  As discussed with [name] (preferred contact, pt HOH), plan to follow up again next week for final transition of care call.     [name]   [facility]  [phone]

## 2017-01-07 NOTE — Telephone Encounter (Signed)
I was not aware of this. Does she need these items (does she already have them)?

## 2017-01-07 NOTE — Telephone Encounter (Signed)
No per [name] great grandaughter she doesn't have shower chair or walker .  Can she get order for these.

## 2017-01-07 NOTE — Patient Outreach (Signed)
Attempt made to contact pt as part of ongoing transition of care-  Left ALF 5/17, recent SNF discharge/recent hospitalization 4/12-4/20 for hyperkalemia, hyperglycemia, acute encephalopathy.   HIPAA compliant voice message left with contact name and number.    Plan:  RN CM to call [name] [name] (per pt considers  great granddaughter, on consent form).    [name] [name].   [facility]  [phone]

## 2017-01-08 ENCOUNTER — Telehealth: Payer: Self-pay | Admitting: Family Medicine

## 2017-01-08 ENCOUNTER — Other Ambulatory Visit: Payer: Self-pay | Admitting: Family Medicine

## 2017-01-08 DIAGNOSIS — Z79899 Other long term (current) drug therapy: Secondary | ICD-10-CM | POA: Diagnosis not present

## 2017-01-08 DIAGNOSIS — E162 Hypoglycemia, unspecified: Secondary | ICD-10-CM | POA: Diagnosis not present

## 2017-01-08 DIAGNOSIS — Z794 Long term (current) use of insulin: Secondary | ICD-10-CM | POA: Diagnosis not present

## 2017-01-08 DIAGNOSIS — E1165 Type 2 diabetes mellitus with hyperglycemia: Secondary | ICD-10-CM | POA: Diagnosis not present

## 2017-01-08 DIAGNOSIS — IMO0002 Reserved for concepts with insufficient information to code with codable children: Secondary | ICD-10-CM

## 2017-01-08 DIAGNOSIS — E1151 Type 2 diabetes mellitus with diabetic peripheral angiopathy without gangrene: Secondary | ICD-10-CM

## 2017-01-08 NOTE — Telephone Encounter (Signed)
Not due for A1C. Verbal for CBC, CMP, UA and culture.  Melissa,  I'm putting in orders for social work.

## 2017-01-08 NOTE — Telephone Encounter (Signed)
Crystal from [employer] called and was looking for a order for a Environmental consultant, they have some social concerns. They also state that the pt is c/o lower abdominal pain possible UTI, they are looking for orders with a ua and culture and sensitivity, along with a cbc, cmp, and a1c. They also need an order for a 2 wheel walker, the walker she has now is make shift and heavy. Please advise, thank you!  Call [place] @ [phone]

## 2017-01-09 ENCOUNTER — Encounter: Payer: Self-pay | Admitting: *Deleted

## 2017-01-09 DIAGNOSIS — I2510 Atherosclerotic heart disease of native coronary artery without angina pectoris: Secondary | ICD-10-CM | POA: Diagnosis not present

## 2017-01-09 DIAGNOSIS — Z8673 Personal history of transient ischemic attack (TIA), and cerebral infarction without residual deficits: Secondary | ICD-10-CM | POA: Diagnosis not present

## 2017-01-09 DIAGNOSIS — E1165 Type 2 diabetes mellitus with hyperglycemia: Secondary | ICD-10-CM | POA: Diagnosis not present

## 2017-01-09 DIAGNOSIS — R531 Weakness: Secondary | ICD-10-CM | POA: Diagnosis not present

## 2017-01-09 DIAGNOSIS — E785 Hyperlipidemia, unspecified: Secondary | ICD-10-CM | POA: Diagnosis not present

## 2017-01-09 DIAGNOSIS — K219 Gastro-esophageal reflux disease without esophagitis: Secondary | ICD-10-CM | POA: Diagnosis not present

## 2017-01-09 DIAGNOSIS — J449 Chronic obstructive pulmonary disease, unspecified: Secondary | ICD-10-CM | POA: Diagnosis not present

## 2017-01-09 DIAGNOSIS — Z794 Long term (current) use of insulin: Secondary | ICD-10-CM | POA: Diagnosis not present

## 2017-01-09 DIAGNOSIS — M810 Age-related osteoporosis without current pathological fracture: Secondary | ICD-10-CM | POA: Diagnosis not present

## 2017-01-09 DIAGNOSIS — E039 Hypothyroidism, unspecified: Secondary | ICD-10-CM | POA: Diagnosis not present

## 2017-01-09 DIAGNOSIS — D649 Anemia, unspecified: Secondary | ICD-10-CM | POA: Diagnosis not present

## 2017-01-09 DIAGNOSIS — G4733 Obstructive sleep apnea (adult) (pediatric): Secondary | ICD-10-CM | POA: Diagnosis not present

## 2017-01-09 DIAGNOSIS — E1151 Type 2 diabetes mellitus with diabetic peripheral angiopathy without gangrene: Secondary | ICD-10-CM | POA: Diagnosis not present

## 2017-01-09 NOTE — Telephone Encounter (Signed)
No referral in chart yet.

## 2017-01-12 ENCOUNTER — Other Ambulatory Visit: Payer: Self-pay | Admitting: *Deleted

## 2017-01-12 ENCOUNTER — Other Ambulatory Visit: Payer: Self-pay | Admitting: Family Medicine

## 2017-01-12 DIAGNOSIS — E1165 Type 2 diabetes mellitus with hyperglycemia: Principal | ICD-10-CM

## 2017-01-12 DIAGNOSIS — IMO0002 Reserved for concepts with insufficient information to code with codable children: Secondary | ICD-10-CM

## 2017-01-12 DIAGNOSIS — E1151 Type 2 diabetes mellitus with diabetic peripheral angiopathy without gangrene: Secondary | ICD-10-CM

## 2017-01-12 NOTE — Patient Outreach (Signed)
Final transition of care call successful as attempt made to contact pt and [name] (on consent form) returned call - ongoing follow up ALF- pt left 5/17, recent SNF stay after hospitalization 4/12-4/20 for hyperkalemia, hyperglycemia, acute encephalopathy.   Spoke with [name], HIPAA verified on pt.  [name] reports she took pt to see [name] 6/14, MD made insulin changes: pt to take Humalog -10 units at breakfast + SS, 6 units at lunch + SS, 10 units at supper + SS plus Lantus was increased from 18 to 20 units at night.  [name] reports pt's sliding scale was also increased to 1,3,5,7,9.  [name] reports pt did good Friday with sugars, daughter did not come over the weekend, so she came Saturday and Sunday- sugars high in am, today in am was 567, recheck 2 hours after breakfast 444, recently before lunch - came down to 331.  [name] reports HH PT is to have social worker come out-  Medicaid for pt so nursing assistant can come on the weekends/pt needs coverage on the weekends.   [name] reports pt is suppose to have lab work done in a month, follow up with [name] in 4 weeks and 6 weeks, need to call MD office to schedule appointments.   [name] reports she was told can call for an earlier appointment for pt if needed.     Plan:   As discussed with [name], today is final transition of care call, plan to follow with  Pt again within next 3 weeks- home visit             Medication profile update.             Care plan updated.    [name]   [facility]  [phone]

## 2017-01-13 ENCOUNTER — Telehealth: Payer: Self-pay

## 2017-01-13 ENCOUNTER — Observation Stay
Admission: EM | Admit: 2017-01-13 | Discharge: 2017-01-16 | Disposition: A | Discharge status: Home / Self Care | Payer: Medicare Other | Attending: Internal Medicine | Admitting: Internal Medicine

## 2017-01-13 ENCOUNTER — Emergency Department: Payer: Medicare Other

## 2017-01-13 ENCOUNTER — Encounter: Payer: Self-pay | Admitting: *Deleted

## 2017-01-13 DIAGNOSIS — K219 Gastro-esophageal reflux disease without esophagitis: Secondary | ICD-10-CM | POA: Diagnosis not present

## 2017-01-13 DIAGNOSIS — G4733 Obstructive sleep apnea (adult) (pediatric): Secondary | ICD-10-CM | POA: Insufficient documentation

## 2017-01-13 DIAGNOSIS — B9620 Unspecified Escherichia coli [E. coli] as the cause of diseases classified elsewhere: Secondary | ICD-10-CM | POA: Insufficient documentation

## 2017-01-13 DIAGNOSIS — E1151 Type 2 diabetes mellitus with diabetic peripheral angiopathy without gangrene: Secondary | ICD-10-CM | POA: Diagnosis not present

## 2017-01-13 DIAGNOSIS — Z794 Long term (current) use of insulin: Secondary | ICD-10-CM | POA: Insufficient documentation

## 2017-01-13 DIAGNOSIS — I2510 Atherosclerotic heart disease of native coronary artery without angina pectoris: Secondary | ICD-10-CM | POA: Insufficient documentation

## 2017-01-13 DIAGNOSIS — Z7951 Long term (current) use of inhaled steroids: Secondary | ICD-10-CM | POA: Diagnosis not present

## 2017-01-13 DIAGNOSIS — R1110 Vomiting, unspecified: Secondary | ICD-10-CM | POA: Diagnosis not present

## 2017-01-13 DIAGNOSIS — R42 Dizziness and giddiness: Secondary | ICD-10-CM

## 2017-01-13 DIAGNOSIS — Z79899 Other long term (current) drug therapy: Secondary | ICD-10-CM | POA: Diagnosis not present

## 2017-01-13 DIAGNOSIS — Z7982 Long term (current) use of aspirin: Secondary | ICD-10-CM | POA: Diagnosis not present

## 2017-01-13 DIAGNOSIS — E785 Hyperlipidemia, unspecified: Secondary | ICD-10-CM | POA: Diagnosis not present

## 2017-01-13 DIAGNOSIS — N189 Chronic kidney disease, unspecified: Secondary | ICD-10-CM

## 2017-01-13 DIAGNOSIS — Z8673 Personal history of transient ischemic attack (TIA), and cerebral infarction without residual deficits: Secondary | ICD-10-CM | POA: Diagnosis not present

## 2017-01-13 DIAGNOSIS — E876 Hypokalemia: Secondary | ICD-10-CM | POA: Diagnosis not present

## 2017-01-13 DIAGNOSIS — J449 Chronic obstructive pulmonary disease, unspecified: Secondary | ICD-10-CM | POA: Diagnosis not present

## 2017-01-13 DIAGNOSIS — D649 Anemia, unspecified: Secondary | ICD-10-CM | POA: Diagnosis not present

## 2017-01-13 DIAGNOSIS — N3000 Acute cystitis without hematuria: Secondary | ICD-10-CM | POA: Diagnosis not present

## 2017-01-13 DIAGNOSIS — E860 Dehydration: Secondary | ICD-10-CM | POA: Diagnosis not present

## 2017-01-13 DIAGNOSIS — E039 Hypothyroidism, unspecified: Secondary | ICD-10-CM | POA: Diagnosis not present

## 2017-01-13 DIAGNOSIS — M810 Age-related osteoporosis without current pathological fracture: Secondary | ICD-10-CM | POA: Diagnosis not present

## 2017-01-13 DIAGNOSIS — F0150 Vascular dementia without behavioral disturbance: Secondary | ICD-10-CM | POA: Insufficient documentation

## 2017-01-13 DIAGNOSIS — I252 Old myocardial infarction: Secondary | ICD-10-CM | POA: Insufficient documentation

## 2017-01-13 DIAGNOSIS — Z853 Personal history of malignant neoplasm of breast: Secondary | ICD-10-CM | POA: Insufficient documentation

## 2017-01-13 DIAGNOSIS — Z7902 Long term (current) use of antithrombotics/antiplatelets: Secondary | ICD-10-CM | POA: Insufficient documentation

## 2017-01-13 DIAGNOSIS — E1122 Type 2 diabetes mellitus with diabetic chronic kidney disease: Secondary | ICD-10-CM | POA: Diagnosis not present

## 2017-01-13 DIAGNOSIS — Z923 Personal history of irradiation: Secondary | ICD-10-CM | POA: Insufficient documentation

## 2017-01-13 DIAGNOSIS — I951 Orthostatic hypotension: Secondary | ICD-10-CM | POA: Insufficient documentation

## 2017-01-13 DIAGNOSIS — N183 Chronic kidney disease, stage 3 (moderate): Secondary | ICD-10-CM | POA: Insufficient documentation

## 2017-01-13 DIAGNOSIS — E871 Hypo-osmolality and hyponatremia: Secondary | ICD-10-CM | POA: Insufficient documentation

## 2017-01-13 DIAGNOSIS — R197 Diarrhea, unspecified: Secondary | ICD-10-CM | POA: Insufficient documentation

## 2017-01-13 DIAGNOSIS — N179 Acute kidney failure, unspecified: Principal | ICD-10-CM | POA: Diagnosis present

## 2017-01-13 DIAGNOSIS — E1165 Type 2 diabetes mellitus with hyperglycemia: Secondary | ICD-10-CM | POA: Diagnosis not present

## 2017-01-13 DIAGNOSIS — Z8542 Personal history of malignant neoplasm of other parts of uterus: Secondary | ICD-10-CM | POA: Insufficient documentation

## 2017-01-13 DIAGNOSIS — N390 Urinary tract infection, site not specified: Secondary | ICD-10-CM | POA: Insufficient documentation

## 2017-01-13 DIAGNOSIS — R531 Weakness: Secondary | ICD-10-CM | POA: Diagnosis not present

## 2017-01-13 LAB — URINALYSIS, COMPLETE (UACMP) WITH MICROSCOPIC
GLUCOSE, UA: 500 mg/dL — AB
Ketones, ur: 80 mg/dL — AB
Nitrite: POSITIVE — AB
Protein, ur: 30 mg/dL — AB
SPECIFIC GRAVITY, URINE: 1.015 (ref 1.005–1.030)
pH: 5.5 (ref 5.0–8.0)

## 2017-01-13 LAB — COMPREHENSIVE METABOLIC PANEL
ALT: 25 U/L (ref 14–54)
ANION GAP: 13 (ref 5–15)
AST: 40 U/L (ref 15–41)
Albumin: 3.9 g/dL (ref 3.5–5.0)
Alkaline Phosphatase: 108 U/L (ref 38–126)
BUN: 34 mg/dL — ABNORMAL HIGH (ref 6–20)
CHLORIDE: 100 mmol/L — AB (ref 101–111)
CO2: 21 mmol/L — AB (ref 22–32)
CREATININE: 1.44 mg/dL — AB (ref 0.44–1.00)
Calcium: 9.7 mg/dL (ref 8.9–10.3)
GFR, EST AFRICAN AMERICAN: 38 mL/min — AB (ref >60)
GFR, EST NON AFRICAN AMERICAN: 33 mL/min — AB (ref >60)
Glucose, Bld: 193 mg/dL — ABNORMAL HIGH (ref 65–99)
Potassium: 3.2 mmol/L — ABNORMAL LOW (ref 3.5–5.1)
SODIUM: 134 mmol/L — AB (ref 135–145)
Total Bilirubin: 0.7 mg/dL (ref 0.3–1.2)
Total Protein: 7.4 g/dL (ref 6.5–8.1)

## 2017-01-13 LAB — CBC WITH DIFFERENTIAL/PLATELET
BASOS ABS: 0.1 10*3/uL (ref 0–0.1)
Basophils Relative: 1 %
EOS PCT: 2 %
Eosinophils Absolute: 0.1 10*3/uL (ref 0–0.7)
HCT: 33.7 % — ABNORMAL LOW (ref 35.0–47.0)
HEMOGLOBIN: 11.4 g/dL — AB (ref 12.0–16.0)
LYMPHS PCT: 21 %
Lymphs Abs: 1.1 10*3/uL (ref 1.0–3.6)
MCH: 32.7 pg (ref 26.0–34.0)
MCHC: 34 g/dL (ref 32.0–36.0)
MCV: 96.1 fL (ref 80.0–100.0)
Monocytes Absolute: 0.4 10*3/uL (ref 0.2–0.9)
Monocytes Relative: 7 %
NEUTROS PCT: 69 %
Neutro Abs: 3.7 10*3/uL (ref 1.4–6.5)
PLATELETS: 284 10*3/uL (ref 150–440)
RBC: 3.5 MIL/uL — AB (ref 3.80–5.20)
RDW: 14.5 % (ref 11.5–14.5)
WBC: 5.3 10*3/uL (ref 3.6–11.0)

## 2017-01-13 LAB — TROPONIN I

## 2017-01-13 LAB — MAGNESIUM: Magnesium: 1.6 mg/dL — ABNORMAL LOW (ref 1.7–2.4)

## 2017-01-13 LAB — PHOSPHORUS: PHOSPHORUS: 3.4 mg/dL (ref 2.5–4.6)

## 2017-01-13 LAB — GLUCOSE, CAPILLARY: GLUCOSE-CAPILLARY: 180 mg/dL — AB (ref 65–99)

## 2017-01-13 MED ORDER — BISACODYL 5 MG PO TBEC
5.0000 mg | DELAYED_RELEASE_TABLET | Freq: Every day | ORAL | Status: DC | PRN
Start: 2017-01-13 — End: 2017-01-16

## 2017-01-13 MED ORDER — LEVOTHYROXINE SODIUM 100 MCG PO TABS
100.0000 ug | ORAL_TABLET | Freq: Every day | ORAL | Status: DC
  Administered 2017-01-14 – 2017-01-16 (×3): 100 ug via ORAL
  Filled 2017-01-13 (×3): qty 1

## 2017-01-13 MED ORDER — INSULIN ASPART 100 UNIT/ML ~~LOC~~ SOLN
0.0000 [IU] | Freq: Three times a day (TID) | SUBCUTANEOUS | Status: DC
  Administered 2017-01-14: 7 [IU] via SUBCUTANEOUS
  Administered 2017-01-14: 9 [IU] via SUBCUTANEOUS
  Filled 2017-01-13 (×3): qty 1

## 2017-01-13 MED ORDER — CEFTRIAXONE SODIUM-DEXTROSE 1-3.74 GM-% IV SOLR
1.0000 g | Freq: Every day | INTRAVENOUS | Status: DC
  Filled 2017-01-13: qty 50

## 2017-01-13 MED ORDER — PANTOPRAZOLE SODIUM 40 MG PO TBEC
40.0000 mg | DELAYED_RELEASE_TABLET | Freq: Two times a day (BID) | ORAL | Status: DC
  Administered 2017-01-14 – 2017-01-16 (×5): 40 mg via ORAL
  Filled 2017-01-13 (×5): qty 1

## 2017-01-13 MED ORDER — INSULIN ASPART 100 UNIT/ML ~~LOC~~ SOLN: 0.0000 [IU] | Freq: Every day | SUBCUTANEOUS | Status: DC

## 2017-01-13 MED ORDER — ALBUTEROL SULFATE (2.5 MG/3ML) 0.083% IN NEBU: 2.5000 mg | INHALATION_SOLUTION | Freq: Four times a day (QID) | RESPIRATORY_TRACT | Status: DC | PRN

## 2017-01-13 MED ORDER — CLOPIDOGREL BISULFATE 75 MG PO TABS
75.0000 mg | ORAL_TABLET | Freq: Every day | ORAL | Status: DC
  Administered 2017-01-14 – 2017-01-16 (×3): 75 mg via ORAL
  Filled 2017-01-13 (×3): qty 1

## 2017-01-13 MED ORDER — IPRATROPIUM BROMIDE 0.02 % IN SOLN: 0.5000 mg | Freq: Four times a day (QID) | RESPIRATORY_TRACT | Status: DC | PRN

## 2017-01-13 MED ORDER — SENNOSIDES-DOCUSATE SODIUM 8.6-50 MG PO TABS
1.0000 | ORAL_TABLET | Freq: Every evening | ORAL | Status: DC | PRN
Start: 2017-01-13 — End: 2017-01-16

## 2017-01-13 MED ORDER — INSULIN GLARGINE 100 UNIT/ML ~~LOC~~ SOLN
20.0000 [IU] | Freq: Every day | SUBCUTANEOUS | Status: DC
  Administered 2017-01-14 – 2017-01-15 (×3): 20 [IU] via SUBCUTANEOUS
  Filled 2017-01-13 (×4): qty 0.2

## 2017-01-13 MED ORDER — SODIUM CHLORIDE 0.9 % IV BOLUS (SEPSIS)
500.0000 mL | Freq: Once | INTRAVENOUS | Status: AC
  Administered 2017-01-13: 500 mL via INTRAVENOUS

## 2017-01-13 MED ORDER — DEXTROSE 5 % IV SOLN
1.0000 g | Freq: Once | INTRAVENOUS | Status: AC
  Administered 2017-01-13: 1 g via INTRAVENOUS
  Filled 2017-01-13: qty 10

## 2017-01-13 MED ORDER — ACETAMINOPHEN 650 MG RE SUPP: 650.0000 mg | Freq: Four times a day (QID) | RECTAL | Status: DC | PRN

## 2017-01-13 MED ORDER — ACETAMINOPHEN 325 MG PO TABS
650.0000 mg | ORAL_TABLET | Freq: Four times a day (QID) | ORAL | Status: DC | PRN
Start: 2017-01-13 — End: 2017-01-16

## 2017-01-13 MED ORDER — ENOXAPARIN SODIUM 30 MG/0.3ML ~~LOC~~ SOLN: 30.0000 mg | SUBCUTANEOUS | Status: DC

## 2017-01-13 MED ORDER — MAGNESIUM CITRATE PO SOLN
1.0000 | Freq: Once | ORAL | Status: DC | PRN
  Filled 2017-01-13: qty 296

## 2017-01-13 MED ORDER — ONDANSETRON HCL 4 MG/2ML IJ SOLN: 4.0000 mg | Freq: Four times a day (QID) | INTRAMUSCULAR | Status: DC | PRN

## 2017-01-13 MED ORDER — SODIUM CHLORIDE 0.9 % IV SOLN
INTRAVENOUS | Status: DC
  Administered 2017-01-13: 75 mL via INTRAVENOUS
  Administered 2017-01-14: 10:00:00 via INTRAVENOUS

## 2017-01-13 MED ORDER — ATORVASTATIN CALCIUM 20 MG PO TABS
80.0000 mg | ORAL_TABLET | Freq: Every day | ORAL | Status: DC
  Administered 2017-01-14 – 2017-01-16 (×3): 80 mg via ORAL
  Filled 2017-01-13 (×3): qty 4

## 2017-01-13 MED ORDER — ONDANSETRON HCL 4 MG/2ML IJ SOLN
4.0000 mg | Freq: Once | INTRAMUSCULAR | Status: AC
  Administered 2017-01-13: 4 mg via INTRAVENOUS
  Filled 2017-01-13: qty 2

## 2017-01-13 MED ORDER — ASPIRIN EC 81 MG PO TBEC
81.0000 mg | DELAYED_RELEASE_TABLET | Freq: Every day | ORAL | Status: DC
  Administered 2017-01-14 – 2017-01-16 (×3): 81 mg via ORAL
  Filled 2017-01-13 (×3): qty 1

## 2017-01-13 MED ORDER — OXYCODONE HCL 5 MG PO TABS
5.0000 mg | ORAL_TABLET | ORAL | Status: DC | PRN
  Administered 2017-01-15: 5 mg via ORAL
  Filled 2017-01-13: qty 1

## 2017-01-13 MED ORDER — FLUTICASONE PROPIONATE 50 MCG/ACT NA SUSP
2.0000 | Freq: Every day | NASAL | Status: DC
  Administered 2017-01-14 – 2017-01-16 (×2): 2 via NASAL
  Filled 2017-01-13: qty 16

## 2017-01-13 MED ORDER — POTASSIUM CHLORIDE CRYS ER 20 MEQ PO TBCR
40.0000 meq | EXTENDED_RELEASE_TABLET | Freq: Two times a day (BID) | ORAL | Status: AC
  Administered 2017-01-14: 40 meq via ORAL
  Filled 2017-01-13: qty 2

## 2017-01-13 MED ORDER — ONDANSETRON HCL 4 MG PO TABS: 4.0000 mg | ORAL_TABLET | Freq: Four times a day (QID) | ORAL | Status: DC | PRN

## 2017-01-13 NOTE — ED Triage Notes (Signed)
Pt arrives via EMS from home, states she was in the middle of physical therapy and when she stood up she got dizzy, states cbg of 252, states pt was recently admitted to hospital for elevated CBG, pt on lantus insulin, awake and alert upon arrival, denies any needs, at present states dizziness upon standing

## 2017-01-13 NOTE — ED Notes (Signed)
Pt given ice water, MD informed that pt became very dizzy upon standing for orthostatic

## 2017-01-13 NOTE — Telephone Encounter (Addendum)
Patient is currently in hospital due Bloodsugar 252 at time of visit , weakness and unsteady gait,and nausea, vomiting  And reported diarrhea  7-10 days told PTA Mel Almond who was working with her today.  Just wanted to make you aware.

## 2017-01-13 NOTE — Consult Note (Signed)
Pharmacy Antibiotic Note  [name] [name] is a 83 y.o. female admitted on 01/13/2017 with UTI.  Pharmacy has been consulted for ceftriaxone dosing.  Plan: Ceftriaxone 1g q 24 hr  Height: 5\' 7"  (170.2 cm) Weight: 138 lb (62.6 kg) IBW/kg (Calculated) : 61.6  Temp (24hrs), Avg:98 F (36.7 C), Min:98 F (36.7 C), Max:98 F (36.7 C)   Recent Labs Lab 01/13/17 1516  WBC 5.3  CREATININE 1.44*    Estimated Creatinine Clearance: 29.3 mL/min (A) (by C-G formula based on SCr of 1.44 mg/dL (H)).    Allergies  Allergen Reactions  . Penicillins Rash    Has patient had a PCN reaction causing immediate rash, facial/tongue/throat swelling, SOB or lightheadedness with hypotension: No Has patient had a PCN reaction causing severe rash involving mucus membranes or skin necrosis: No Has patient had a PCN reaction that required hospitalization No Has patient had a PCN reaction occurring within the last 10 years: No If all of the above answers are "NO", then may proceed with Cephalosporin use.     Antimicrobials this admission: ceftriaxone 6/19 >>    Dose adjustments this admission:   Microbiology results:  6/19 UCx:     Thank you for allowing pharmacy to be a part of this patient's care.  Ramond Dial, Pharm.D, BCPS Clinical Pharmacist  01/13/2017 8:26 PM

## 2017-01-13 NOTE — ED Provider Notes (Signed)
[facility] Emergency Department Provider Note  ____________________________________________   I have reviewed the triage vital signs and the nursing notes.   HISTORY  Chief Complaint Dizziness    HPI [place] is a 83 y.o. female with muscle different medical problems states that she's been lightheaded since yesterday. Patient has had significant diarrhea, nonbloody and non-melanotic, as well as vomiting 2 this morning since that time she has been lightheaded when she stands up and she denies a chest pain or shortness of breath or abdominal pain she denies any current vomiting but she is somewhat nauseated. She denies any pain in her stomach. She states that she has had no fevers or chills. She is unsure if she's had any urinary symptoms. Family feels that she is at her baseline.  She denies any cough runny nose or URI symptoms she denies any discomfort in her stomach of any variety. She does have a history of irritable bowel syndrome.   Past Medical History:  Diagnosis Date  . Anxiety state, unspecified   . CAD (coronary artery disease)   . Cancer [facility])    2004 Right breast, found on mammogram, radiation therapy, Dr. Bryson Ha, uterine cancer,   . COPD (chronic obstructive pulmonary disease) ([place])   . Diabetes mellitus   . Esophageal reflux   . Heart burn   . Hyperlipidemia   . IBS (irritable bowel syndrome)   . Myocardial infarction [facility])    Cath negative except for 40% occlusion LAD.  Pt not candidate for betablocker or ACEI because of hypotension  . OSA (obstructive sleep apnea)   . Osteoporosis 02/21/09   DEXA scan showed osteoporosis with left femur T-score -2.8.  Marland Kitchen Presbyacusis   . Stroke ([place])   . Thyroid disease    Hypothyroid  . Vitamin D deficiency     Patient Active Problem List   Diagnosis Date Noted  . Dementia, vascular 12/03/2016  . Stroke ([place]) 08/20/2016  . Peripheral vascular disease ([place]) 04/09/2016  . CKD  stage 3 due to type 2 diabetes mellitus ([place]) 02/22/2014  . Hypothyroidism 09/23/2012  . GERD (gastroesophageal reflux disease) 09/23/2012  . Anxiety 07/26/2012  . Hyperlipidemia with target LDL less than 70 12/03/2011  . DM (diabetes mellitus) type II uncontrolled, periph vascular disorder ([place]) 04/03/2011  . CAD, NATIVE VESSEL 12/13/2009    Past Surgical History:  Procedure Laterality Date  . ABDOMINAL HYSTERECTOMY     uterine cancer  . BREAST SURGERY    . COLON SURGERY  2013   done at [facility]    Prior to Admission medications   Medication Sig Start Date End Date Taking? Authorizing Provider  aspirin EC 81 MG tablet Take 1 tablet (81 mg total) by mouth daily. 04/09/16   [name], DO  atorvastatin (LIPITOR) 80 MG tablet Take 1 tablet (80 mg total) by mouth daily. 08/21/16   [name], DO  clopidogrel (PLAVIX) 75 MG tablet TAKE 1 TABLET(75 MG) BY MOUTH DAILY 01/01/17   Cook, Jayce G, DO  fluticasone (FLONASE) 50 MCG/ACT nasal spray Place 2 sprays into both nostrils daily. 01/01/17   [name], DO  glucagon 1 MG injection Inject 1 mg into the vein once as needed.    [provider]  insulin aspart (NOVOLOG) 100 UNIT/ML injection Inject 8 Units into the skin 3 (three) times daily before meals.    [provider]  insulin aspart (NOVOLOG) 100 UNIT/ML injection Inject into the skin 3 (three) times  daily as needed. Sliding scale:  200-249 take one unit 250-299 take 2 units 300-349 take 3 units 350-399 take 4 units  >400 take 5 units    [provider]  insulin glargine (LANTUS) 100 UNIT/ML injection Inject 18 Units into the skin at bedtime.    [provider]  insulin lispro (HUMALOG) 100 UNIT/ML injection Inject into the skin 3 (three) times daily before meals. Per [name], Dr. Delorise Shiner visit 6/14 pt to take 10 units at breakfast, 6 units at lunch, 10 units at supper.    [provider]  insulin lispro (HUMALOG) 100 UNIT/ML injection  Inject into the skin 3 (three) times daily with meals as needed. Sliding scale:   200-249 = 1 unit 250-299 = 3 units 300-349 = 5 units 350-399 = 7 units  >400 = 9 units.    [provider]  levothyroxine (SYNTHROID, LEVOTHROID) 100 MCG tablet TAKE 1 TABLET(100 MCG) BY MOUTH DAILY 01/01/17   Thersa Salt G, DO  metFORMIN (GLUCOPHAGE) 500 MG tablet TAKE 1 TABLET(500 MG) BY MOUTH TWICE DAILY WITH A MEAL 10/20/16   Cook, [place], DO  ONE TOUCH ULTRA TEST test strip U QID UTD 12/16/16   [provider]  pantoprazole (PROTONIX) 40 MG tablet Take 1 tablet (40 mg total) by mouth 2 (two) times daily. 01/04/16   Jackolyn Confer, MD    Allergies Penicillins  Family History  Problem Relation Age of Onset  . Diabetes Mother   . Heart attack Father     Social History Social History  Substance Use Topics  . Smoking status: Never Smoker  . Smokeless tobacco: Never Used  . Alcohol use No    Review of Systems Constitutional: No fever/chills Eyes: No visual changes. ENT: No sore throat. No stiff neck no neck pain Cardiovascular: Denies chest pain. Respiratory: Denies shortness of breath. Gastrointestinal:   See history of present illness Genitourinary: Recent not sure if she has dysuria Musculoskeletal: Negative lower extremity swelling Skin: Negative for rash. Neurological: Negative for severe headaches, focal weakness or numbness.   ____________________________________________   PHYSICAL EXAM:  VITAL SIGNS: ED Triage Vitals  Enc Vitals Group     BP 01/13/17 1512 (!) 104/44     Pulse Rate 01/13/17 1512 94     Resp 01/13/17 1512 18     Temp 01/13/17 1512 98 F (36.7 C)     Temp Source 01/13/17 1512 Oral     SpO2 01/13/17 1512 96 %     Weight 01/13/17 1509 138 lb (62.6 kg)     Height 01/13/17 1509 5\' 7"  (1.702 m)     Head Circumference --      Peak Flow --      Pain Score --      Pain Loc --      Pain Edu? --      Excl. in [place]? --     Constitutional: Alert  and orientedTo name and place unsure of the date pleasantly demented. Well appearing and in no acute distress. Eyes: Conjunctivae are normal Head: Atraumatic HEENT: No congestion/rhinnorhea. Mucous membranes are dry.  Oropharynx non-erythematous Neck:   Nontender with no meningismus, no masses, no stridor Cardiovascular: Normal rate, regular rhythm. Grossly normal heart sounds.  Good peripheral circulation. Respiratory: Normal respiratory effort.  No retractions. Lungs CTAB. Abdominal: Soft and nontender. No distention. No guarding no rebound Back:  There is no focal tenderness or step off.  there is no midline tenderness there are no lesions  noted. there is no CVA tenderness Musculoskeletal: No lower extremity tenderness, no upper extremity tenderness. No joint effusions, no DVT signs strong distal pulses no edema Neurologic:  Normal speech and language. No gross focal neurologic deficits are appreciated.  Skin:  Skin is warm, dry and intact. No rash noted. Psychiatric: Mood and affect are normal. Speech and behavior are normal.  ____________________________________________   LABS (all labs ordered are listed, but only abnormal results are displayed)  Labs Reviewed  GLUCOSE, CAPILLARY - Abnormal; Notable for the following:       Result Value   Glucose-Capillary 180 (*)    All other components within normal limits  COMPREHENSIVE METABOLIC PANEL - Abnormal; Notable for the following:    Sodium 134 (*)    Potassium 3.2 (*)    Chloride 100 (*)    CO2 21 (*)    Glucose, Bld 193 (*)    BUN 34 (*)    Creatinine, Ser 1.44 (*)    GFR calc non Af Amer 33 (*)    GFR calc Af Amer 38 (*)    All other components within normal limits  CBC WITH DIFFERENTIAL/PLATELET - Abnormal; Notable for the following:    RBC 3.50 (*)    Hemoglobin 11.4 (*)    HCT 33.7 (*)    All other components within normal limits  URINALYSIS, COMPLETE (UACMP) WITH MICROSCOPIC - Abnormal; Notable for the following:     APPearance CLOUDY (*)    Glucose, UA 500 (*)    Hgb urine dipstick TRACE (*)    Bilirubin Urine SMALL (*)    Ketones, ur 80 (*)    Protein, ur 30 (*)    Nitrite POSITIVE (*)    Leukocytes, UA SMALL (*)    Squamous Epithelial / LPF 0-5 (*)    Bacteria, UA FEW (*)    All other components within normal limits  URINE CULTURE  TROPONIN I   ____________________________________________  EKG  I personally interpreted any EKGs ordered by me or triage Normal sinus rhythm rate 96 bpm, nonspecific ST flattening diffusely, no acute ischemic normal axis ____________________________________________  RADIOLOGY  I reviewed any imaging ordered by me or triage that were performed during my shift and, if possible, patient and/or family made aware of any abnormal findings. ____________________________________________   PROCEDURES  Procedure(s) performed: None  Procedures  Critical Care performed: None  ____________________________________________   INITIAL IMPRESSION / ASSESSMENT AND PLAN / ED COURSE  Pertinent labs & imaging results that were available during my care of the patient were reviewed by me and considered in my medical decision making (see chart for details).  Patient with orthostatic vital signs getting IV fluid creatinine is elevated over baseline, she does have a urinary tract infection for which I think is somewhat contributing to her symptoms that she still feels very lightheaded when she stands up. Patient will be admitted for IV antibiotics and continued hydration.    ____________________________________________   FINAL CLINICAL IMPRESSION(S) / ED DIAGNOSES  Final diagnoses:  Dizziness  Dehydration  Acute cystitis without hematuria      This chart was dictated using voice recognition software.  Despite best efforts to proofread,  errors can occur which can change meaning.      Schuyler Amor, MD 01/13/17 Tresa Moore

## 2017-01-13 NOTE — H&P (Signed)
History and Physical   [facility] - [place] [facility] Admission History and Physical [employer], D.O.    Patient Name: [name] [name] MR#: [id number] Date of Birth: [birth year] Date of Admission: 01/13/2017  Referring MD/NP/PA: Dr. Burlene Arnt Primary Care Physician: [name], DO Patient coming from: Home   Chief Complaint:  Chief Complaint  Patient presents with  . Dizziness    HPI: [name] [name] is a 83 y.o. female with a known history of CAD, COPD, DM, GERD, HLD, CVA, hypothyroid presents to the emergency department for evaluation of dizziness during PT.  Patient was in a usual state of health until yesterday when she developed dizziness and lightheadedness.  Notably she has nausea, diarrhea and vomiting yesterday without abdominal pain.  . Patient denies fevers/chills, , chest pain, shortness of breath,  abdominal pain, dysuria/frequency, changes in mental status.    Otherwise there has been no change in status. Patient has been taking medication as prescribed and there has been no recent change in medication or diet.  No recent antibiotics.  There has been no recent illness, hospitalizations, travel or sick contacts.    EMS/ED Course: Patient received Rocephin, Zofran, NS.  Review of Systems:  CONSTITUTIONAL: Positive weakness, dizzines. No fever/chills, fatigue,  weight gain/loss, headache. EYES: No blurry or double vision. ENT: No tinnitus, postnasal drip, redness or soreness of the oropharynx. RESPIRATORY: No cough, dyspnea, wheeze.  No hemoptysis.  CARDIOVASCULAR: No chest pain, palpitations, syncope, orthopnea. No lower extremity edema.  GASTROINTESTINAL: Positive nausea, vomiting,  diarrhea, No abdominal pain, constipation.  No hematemesis, melena or hematochezia. GENITOURINARY: No dysuria, frequency, hematuria. ENDOCRINE: No polyuria or nocturia. No heat or cold intolerance. HEMATOLOGY: No anemia, bruising, bleeding. INTEGUMENTARY: No rashes, ulcers,  lesions. MUSCULOSKELETAL: No arthritis, gout, dyspnea. NEUROLOGIC: No numbness, tingling, ataxia, seizure-type activity, weakness. PSYCHIATRIC: No anxiety, depression, insomnia.   Past Medical History:  Diagnosis Date  . Anxiety state, unspecified   . CAD (coronary artery disease)   . Cancer [facility])    2004 Right breast, found on mammogram, radiation therapy, Dr. Bryson Ha, uterine cancer,   . COPD (chronic obstructive pulmonary disease) ([place])   . Diabetes mellitus   . Esophageal reflux   . Heart burn   . Hyperlipidemia   . IBS (irritable bowel syndrome)   . Myocardial infarction [facility])    Cath negative except for 40% occlusion LAD.  Pt not candidate for betablocker or ACEI because of hypotension  . OSA (obstructive sleep apnea)   . Osteoporosis 02/21/09   DEXA scan showed osteoporosis with left femur T-score -2.8.  Marland Kitchen Presbyacusis   . Stroke ([place])   . Thyroid disease    Hypothyroid  . Vitamin D deficiency     Past Surgical History:  Procedure Laterality Date  . ABDOMINAL HYSTERECTOMY     uterine cancer  . BREAST SURGERY    . COLON SURGERY  2013   done at [facility]     reports that she has never smoked. She has never used smokeless tobacco. She reports that she does not drink alcohol or use drugs.  Allergies  Allergen Reactions  . Penicillins Rash    Has patient had a PCN reaction causing immediate rash, facial/tongue/throat swelling, SOB or lightheadedness with hypotension: No Has patient had a PCN reaction causing severe rash involving mucus membranes or skin necrosis: No Has patient had a PCN reaction that required hospitalization No Has patient had a PCN reaction occurring within the last 10 years: No If all of the  above answers are "NO", then may proceed with Cephalosporin use.     Family History  Problem Relation Age of Onset  . Diabetes Mother   . Heart attack Father     Prior to Admission medications   Medication Sig Start Date End Date Taking? Authorizing  Provider  aspirin EC 81 MG tablet Take 1 tablet (81 mg total) by mouth daily. 04/09/16   [name], DO  atorvastatin (LIPITOR) 80 MG tablet Take 1 tablet (80 mg total) by mouth daily. 08/21/16   [name], DO  clopidogrel (PLAVIX) 75 MG tablet TAKE 1 TABLET(75 MG) BY MOUTH DAILY 01/01/17   Cook, Jayce G, DO  fluticasone (FLONASE) 50 MCG/ACT nasal spray Place 2 sprays into both nostrils daily. 01/01/17   [name], DO  glucagon 1 MG injection Inject 1 mg into the vein once as needed.    [provider]  insulin aspart (NOVOLOG) 100 UNIT/ML injection Inject 8 Units into the skin 3 (three) times daily before meals.    [provider]  insulin aspart (NOVOLOG) 100 UNIT/ML injection Inject into the skin 3 (three) times daily as needed. Sliding scale:  200-249 take one unit 250-299 take 2 units 300-349 take 3 units 350-399 take 4 units  >400 take 5 units    [provider]  insulin glargine (LANTUS) 100 UNIT/ML injection Inject 18 Units into the skin at bedtime.    [provider]  insulin lispro (HUMALOG) 100 UNIT/ML injection Inject into the skin 3 (three) times daily before meals. Per [name], Dr. Delorise Shiner visit 6/14 pt to take 10 units at breakfast, 6 units at lunch, 10 units at supper.    [provider]  insulin lispro (HUMALOG) 100 UNIT/ML injection Inject into the skin 3 (three) times daily with meals as needed. Sliding scale:   200-249 = 1 unit 250-299 = 3 units 300-349 = 5 units 350-399 = 7 units  >400 = 9 units.    [provider]  levothyroxine (SYNTHROID, LEVOTHROID) 100 MCG tablet TAKE 1 TABLET(100 MCG) BY MOUTH DAILY 01/01/17   Thersa Salt G, DO  metFORMIN (GLUCOPHAGE) 500 MG tablet TAKE 1 TABLET(500 MG) BY MOUTH TWICE DAILY WITH A MEAL 10/20/16   Cook, [place], DO  ONE TOUCH ULTRA TEST test strip U QID UTD 12/16/16   [provider]  pantoprazole (PROTONIX) 40 MG tablet Take 1 tablet (40 mg total) by mouth 2 (two)  times daily. 01/04/16   Jackolyn Confer, MD    Physical Exam: Vitals:   01/13/17 1530 01/13/17 1630 01/13/17 1730 01/13/17 1830  BP: (!) 96/52 (!) 97/46 (!) 112/52 (!) 99/51  Pulse: 94 86 83 87  Resp: (!) 23 17 10 16   Temp:      TempSrc:      SpO2: 94% 95% 100% 99%  Weight:      Height:        GENERAL: 83 y.o.-year-old frail white female patient, well-developed, well-nourished lying in the bed in no acute distress.  Pleasant and cooperative.   HEENT: Head atraumatic, normocephalic. Pupils equal, round, reactive to light and accommodation. No scleral icterus. Extraocular muscles intact. Nares are patent. Oropharynx is clear. Mucus membranes dry . NECK: Supple, full range of motion. No JVD, no bruit heard. No thyroid enlargement, no tenderness, no cervical lymphadenopathy. CHEST: Normal breath sounds bilaterally. No wheezing, rales, rhonchi or crackles. No use of accessory muscles of respiration.  No reproducible chest wall tenderness.  CARDIOVASCULAR: S1,  S2 normal. No murmurs, rubs, or gallops. Cap refill <2 seconds. Pulses intact distally.  ABDOMEN: Soft, nondistended, nontender. No rebound, guarding, rigidity. Normoactive bowel sounds present in all four quadrants. No organomegaly or mass. EXTREMITIES: No pedal edema, cyanosis, or clubbing. No calf tenderness or Homan's sign.  NEUROLOGIC: The patient is alert and oriented x 3. Cranial nerves II through XII are grossly intact with no focal sensorimotor deficit. Muscle strength 5/5 in all extremities. Sensation intact. Gait not checked. PSYCHIATRIC:  Normal affect, mood, thought content. SKIN: Warm, dry, and intact without obvious rash, lesion, or ulcer.    Labs on Admission:  CBC:  Recent Labs Lab 01/13/17 1516  WBC 5.3  NEUTROABS 3.7  HGB 11.4*  HCT 33.7*  MCV 96.1  PLT 810   Basic Metabolic Panel:  Recent Labs Lab 01/13/17 1516  NA 134*  K 3.2*  CL 100*  CO2 21*  GLUCOSE 193*  BUN 34*  CREATININE 1.44*   CALCIUM 9.7   GFR: Estimated Creatinine Clearance: 29.3 mL/min (A) (by C-G formula based on SCr of 1.44 mg/dL (H)). Liver Function Tests:  Recent Labs Lab 01/13/17 1516  AST 40  ALT 25  ALKPHOS 108  BILITOT 0.7  PROT 7.4  ALBUMIN 3.9   No results for input(s): LIPASE, AMYLASE in the last 168 hours. No results for input(s): AMMONIA in the last 168 hours. Coagulation Profile: No results for input(s): INR, PROTIME in the last 168 hours. Cardiac Enzymes:  Recent Labs Lab 01/13/17 1516  TROPONINI <0.03   BNP (last 3 results) No results for input(s): PROBNP in the last 8760 hours. HbA1C: No results for input(s): HGBA1C in the last 72 hours. CBG:  Recent Labs Lab 01/13/17 1517  GLUCAP 180*   Lipid Profile: No results for input(s): CHOL, HDL, LDLCALC, TRIG, CHOLHDL, LDLDIRECT in the last 72 hours. Thyroid Function Tests: No results for input(s): TSH, T4TOTAL, FREET4, T3FREE, THYROIDAB in the last 72 hours. Anemia Panel: No results for input(s): VITAMINB12, FOLATE, FERRITIN, TIBC, IRON, RETICCTPCT in the last 72 hours. Urine analysis:    Component Value Date/Time   COLORURINE YELLOW 01/13/2017 1740   APPEARANCEUR CLOUDY (A) 01/13/2017 1740   APPEARANCEUR Clear 10/15/2012 1929   LABSPEC 1.015 01/13/2017 1740   LABSPEC 1.029 10/15/2012 1929   PHURINE 5.5 01/13/2017 1740   GLUCOSEU 500 (A) 01/13/2017 1740   GLUCOSEU >=1000 (A) 07/20/2015 1418   HGBUR TRACE (A) 01/13/2017 1740   BILIRUBINUR SMALL (A) 01/13/2017 1740   BILIRUBINUR negative 08/28/2016 1404   BILIRUBINUR Negative 10/15/2012 1929   KETONESUR 80 (A) 01/13/2017 1740   PROTEINUR 30 (A) 01/13/2017 1740   UROBILINOGEN 0.2 08/28/2016 1404   UROBILINOGEN 0.2 07/20/2015 1418   NITRITE POSITIVE (A) 01/13/2017 1740   LEUKOCYTESUR SMALL (A) 01/13/2017 1740   LEUKOCYTESUR Negative 10/15/2012 1929   Sepsis Labs: @LABRCNTIP (procalcitonin:4,lacticidven:4) )No results found for this or any previous visit (from  the past 240 hour(s)).   Radiological Exams on Admission: Dg Chest 2 View  Result Date: 01/13/2017 CLINICAL DATA:  History of right breast cancer with vomiting. EXAM: CHEST  2 VIEW COMPARISON:  11/11/2016 FINDINGS: Lungs are clear without airspace disease or pulmonary edema. No large pleural effusions. Thoracic aorta is heavily calcified. There is some scarring at the lung apices. Heart size is normal. No acute bone abnormality. IMPRESSION: No active cardiopulmonary disease. Electronically Signed   By: Markus Daft M.D.   On: 01/13/2017 16:25    EKG: Normal sinus rhythm at 96 bpm with normal  axis and nonspecific ST-T wave changes.   Assessment/Plan  This is a 83 y.o. female with a history of CAD, COPD, DM, GERD, HLD, CVA, hypothyroid now being admitted with:  #. AKI and orthostatic hypotension secondary to dehydration, UTI, gastroenteritis - Admit observation - IV fluids and repeat BMP in AM.  - Avoid nephrotoxic medications - Bladder scan and place foley catheter if evidence of urinary retention  #. UTI - IV Rocephin - Follow up cultures  #. Orthostatic hypotension 2/2 above - IVFs - Check orthostatics in AM - Fall precautions and change position slowly.   #. Electrolyte abnormalities including hyponatremia and hypokalemia due to vomiting and diarrhea - IVFs, replace K by mouth - Recheck BMP in AM  #. Recent gastroenteritis, improving - Check stool panel  #. History of Diabetes - Accuchecks achs with RISS coverage - Heart healthy, carb controlled diet -Hold metformin - Continue Lantus  #. History of hypothyroid - Continue Synthroid  #. History of CAD - Continue aspirin, Plavix  #. History of hyperlipidemia - Continue atorvastatin  #. History of GERD - Continue Protonix  Admission status: Observation IV Fluids: Normal saline Diet/Nutrition: Heart healthy, carb controlled Consults called: None  DVT Px: Lovenox, SCDs and early ambulation. Code Status: Full Code   Disposition Plan: To home in 1-2 days  All the records are reviewed and case discussed with ED provider. Management plans discussed with the patient and/or family who express understanding and agree with plan of care.  Shandrea Lusk D.O. on 01/13/2017 at 6:35 PM Between 7am to 6pm - Pager - [phone] After 6pm go to [email or web address] - Editor, commissioning [facility] [place] [facility] Office [phone] CC: Primary care physician; [name], DO   01/13/2017, 6:35 PM

## 2017-01-14 DIAGNOSIS — E860 Dehydration: Secondary | ICD-10-CM | POA: Diagnosis not present

## 2017-01-14 DIAGNOSIS — N390 Urinary tract infection, site not specified: Secondary | ICD-10-CM | POA: Diagnosis not present

## 2017-01-14 DIAGNOSIS — I951 Orthostatic hypotension: Secondary | ICD-10-CM | POA: Diagnosis not present

## 2017-01-14 DIAGNOSIS — N179 Acute kidney failure, unspecified: Secondary | ICD-10-CM | POA: Diagnosis not present

## 2017-01-14 LAB — GLUCOSE, CAPILLARY
GLUCOSE-CAPILLARY: 534 mg/dL — AB (ref 65–99)
GLUCOSE-CAPILLARY: 307 mg/dL — AB (ref 65–99)
GLUCOSE-CAPILLARY: 239 mg/dL — AB (ref 65–99)
Glucose-Capillary: 526 mg/dL (ref 65–99)
Glucose-Capillary: 529 mg/dL (ref 65–99)
Glucose-Capillary: 446 mg/dL — ABNORMAL HIGH (ref 65–99)
Glucose-Capillary: 325 mg/dL — ABNORMAL HIGH (ref 65–99)

## 2017-01-14 LAB — BASIC METABOLIC PANEL
Anion gap: 14 (ref 5–15)
BUN: 27 mg/dL — AB (ref 6–20)
CHLORIDE: 102 mmol/L (ref 101–111)
CO2: 18 mmol/L — ABNORMAL LOW (ref 22–32)
Calcium: 8.6 mg/dL — ABNORMAL LOW (ref 8.9–10.3)
Creatinine, Ser: 1.23 mg/dL — ABNORMAL HIGH (ref 0.44–1.00)
GFR calc Af Amer: 46 mL/min — ABNORMAL LOW (ref >60)
GFR calc non Af Amer: 40 mL/min — ABNORMAL LOW (ref >60)
GLUCOSE: 487 mg/dL — AB (ref 65–99)
Potassium: 4.2 mmol/L (ref 3.5–5.1)
Sodium: 134 mmol/L — ABNORMAL LOW (ref 135–145)

## 2017-01-14 LAB — CBC
HEMATOCRIT: 28.8 % — AB (ref 35.0–47.0)
Hemoglobin: 9.8 g/dL — ABNORMAL LOW (ref 12.0–16.0)
MCH: 32.6 pg (ref 26.0–34.0)
MCHC: 33.9 g/dL (ref 32.0–36.0)
MCV: 96.3 fL (ref 80.0–100.0)
Platelets: 236 10*3/uL (ref 150–440)
RBC: 2.99 MIL/uL — ABNORMAL LOW (ref 3.80–5.20)
RDW: 14.5 % (ref 11.5–14.5)
WBC: 5.5 10*3/uL (ref 3.6–11.0)

## 2017-01-14 MED ORDER — INSULIN ASPART 100 UNIT/ML ~~LOC~~ SOLN
0.0000 [IU] | Freq: Three times a day (TID) | SUBCUTANEOUS | Status: DC
  Administered 2017-01-14: 11 [IU] via SUBCUTANEOUS
  Administered 2017-01-15 (×2): 5 [IU] via SUBCUTANEOUS
  Administered 2017-01-15: 3 [IU] via SUBCUTANEOUS
  Administered 2017-01-16: 5 [IU] via SUBCUTANEOUS
  Administered 2017-01-16: 2 [IU] via SUBCUTANEOUS
  Filled 2017-01-14 (×6): qty 1

## 2017-01-14 MED ORDER — DEXTROSE 5 % IV SOLN
1.0000 g | Freq: Every day | INTRAVENOUS | Status: DC
  Administered 2017-01-14: 1 g via INTRAVENOUS
  Filled 2017-01-14 (×3): qty 10

## 2017-01-14 MED ORDER — INSULIN ASPART 100 UNIT/ML ~~LOC~~ SOLN
10.0000 [IU] | Freq: Once | SUBCUTANEOUS | Status: AC
  Administered 2017-01-14: 10 [IU] via SUBCUTANEOUS

## 2017-01-14 MED ORDER — INSULIN ASPART 100 UNIT/ML ~~LOC~~ SOLN
6.0000 [IU] | Freq: Three times a day (TID) | SUBCUTANEOUS | Status: DC
  Administered 2017-01-14 – 2017-01-16 (×6): 6 [IU] via SUBCUTANEOUS
  Filled 2017-01-14 (×6): qty 1

## 2017-01-14 MED ORDER — ENOXAPARIN SODIUM 40 MG/0.4ML ~~LOC~~ SOLN
40.0000 mg | SUBCUTANEOUS | Status: DC
  Administered 2017-01-14 – 2017-01-15 (×2): 40 mg via SUBCUTANEOUS
  Filled 2017-01-14 (×2): qty 0.4

## 2017-01-14 MED ORDER — ADULT MULTIVITAMIN W/MINERALS CH
1.0000 | ORAL_TABLET | Freq: Every day | ORAL | Status: DC
  Administered 2017-01-15 – 2017-01-16 (×2): 1 via ORAL
  Filled 2017-01-14 (×2): qty 1

## 2017-01-14 MED ORDER — INSULIN ASPART 100 UNIT/ML ~~LOC~~ SOLN
6.0000 [IU] | Freq: Once | SUBCUTANEOUS | Status: AC
  Administered 2017-01-14: 6 [IU] via SUBCUTANEOUS
  Filled 2017-01-14: qty 1

## 2017-01-14 MED ORDER — INSULIN ASPART 100 UNIT/ML ~~LOC~~ SOLN
0.0000 [IU] | Freq: Every day | SUBCUTANEOUS | Status: DC
  Administered 2017-01-14 – 2017-01-15 (×2): 2 [IU] via SUBCUTANEOUS
  Filled 2017-01-14 (×2): qty 1

## 2017-01-14 NOTE — Progress Notes (Signed)
Inpatient Diabetes Program Recommendations  AACE/ADA: New Consensus Statement on Inpatient Glycemic Control (2015)  Target Ranges:  Prepandial:   less than 140 mg/dL      Peak postprandial:   less than 180 mg/dL (1-2 hours)      Critically ill patients:  140 - 180 mg/dL   Lab Results  Component Value Date   GLUCAP 446 (H) 01/14/2017   HGBA1C 10.2 (H) 11/07/2016    Review of Glycemic Control:  Results for KEESHA, PELLUM (MRN [id number]) as of 01/14/2017 10:58  Ref. Range 01/14/2017 09:17  Glucose-Capillary Latest Ref Range: 65 - 99 mg/dL 446 (H)    Diabetes history: Type 2 diabetes Outpatient Diabetes medications: Lantus 20 units q HS, Humalog 6 units breakfast/10 units lunch/ 6 units supper Current orders for Inpatient glycemic control:  Novolog sensitive tid with meals and HS, Lantus 20 units q HS Inpatient Diabetes Program Recommendations:    Note that Lantus was held last PM.  Patient has been given dose of Lantus this morning.  Please consider adding Novolog meal coverage 6 units tid with meals-hold if patient eats less than 50%. Will also talk to patient regarding home diabetes management.   Thanks Adah Perl, RN, BC-ADM Inpatient Diabetes Coordinator Pager [phone] (8a-5p)

## 2017-01-14 NOTE — Progress Notes (Signed)
Pharmacist - Prescriber Communication  Enoxaparin dose modified to 40 mg subcutaneously once daily due to CrCl > 30 mL/min and ABW > 45 kg.   [name] [name] A. [place], [place].D., BCPS Clinical Pharmacist 01/14/2017 09:33

## 2017-01-14 NOTE — Care Management Obs Status (Signed)
[place] NOTIFICATION   Patient Details  Name: [name] [name] MRN: [id number] Date of Birth: [birth year]   Medicare Observation Status Notification Given:  Yes    Beverly Sessions, RN 01/14/2017, 4:21 PM

## 2017-01-14 NOTE — NC FL2 (Signed)
[place] LEVEL OF CARE SCREENING TOOL     IDENTIFICATION  Patient Name: [name] [name] Birthdate: 1931/10/24 Sex: female Admission Date (Current Location): 01/13/2017  [facility] : [facility] and [place] Number:  Engineering geologist and Address:  [facility], [address], [place], [place] [address]      Provider Number: [id number]  Attending Physician Name and Address:  Henreitta Leber, MD  Relative Name and Phone Number:       Current Level of Care: Hospital Recommended Level of Care: [place] Prior Approval Number:    Date Approved/Denied:   PASRR Number:    Discharge Plan: SNF    Current Diagnoses: Patient Active Problem List   Diagnosis Date Noted  . AKI (acute kidney injury) ([place]) 01/13/2017  . Dementia, vascular 12/03/2016  . Stroke ([place]) 08/20/2016  . Peripheral vascular disease ([place]) 04/09/2016  . CKD stage 3 due to type 2 diabetes mellitus ([place]) 02/22/2014  . Hypothyroidism 09/23/2012  . GERD (gastroesophageal reflux disease) 09/23/2012  . Anxiety 07/26/2012  . Hyperlipidemia with target LDL less than 70 12/03/2011  . DM (diabetes mellitus) type II uncontrolled, periph vascular disorder ([place]) 04/03/2011  . CAD, NATIVE VESSEL 12/13/2009    Orientation RESPIRATION BLADDER Height & Weight     Self, Time, Situation, Place  Normal Incontinent Weight: 138 lb (62.6 kg) Height:  5\' 7"  (170.2 cm)  BEHAVIORAL SYMPTOMS/MOOD NEUROLOGICAL BOWEL NUTRITION STATUS   (none)  (none) Continent Diet (carb modified)  AMBULATORY STATUS COMMUNICATION OF NEEDS Skin   Limited Assist Verbally Normal                       Personal Care Assistance Level of Assistance  Bathing, Dressing Bathing Assistance: Limited assistance   Dressing Assistance: Limited assistance     Functional Limitations Info  Sight Sight Info: Impaired        SPECIAL CARE FACTORS FREQUENCY  PT (By licensed PT)                     Contractures Contractures Info: Not present    Additional Factors Info  Code Status, Allergies Code Status Info: full Allergies Info: pcn's           Current Medications (01/14/2017):  This is the current hospital active medication list Current Facility-Administered Medications  Medication Dose Route Frequency Provider Last Rate Last Dose  . 0.9 %  sodium chloride infusion   Intravenous Continuous Hugelmeyer, Alexis, DO 75 mL/hr at 01/14/17 1002    . acetaminophen (TYLENOL) tablet 650 mg  650 mg Oral Q6H PRN Hugelmeyer, Alexis, DO       Or  . acetaminophen (TYLENOL) suppository 650 mg  650 mg Rectal Q6H PRN Hugelmeyer, Alexis, DO      . albuterol (PROVENTIL) (2.5 MG/3ML) 0.083% nebulizer solution 2.5 mg  2.5 mg Nebulization Q6H PRN Hugelmeyer, Alexis, DO      . aspirin EC tablet 81 mg  81 mg Oral Daily Hugelmeyer, Alexis, DO   81 mg at 01/14/17 1000  . atorvastatin (LIPITOR) tablet 80 mg  80 mg Oral Daily Hugelmeyer, Alexis, DO   80 mg at 01/14/17 1000  . bisacodyl (DULCOLAX) EC tablet 5 mg  5 mg Oral Daily PRN Hugelmeyer, Alexis, DO      . cefTRIAXone (ROCEPHIN) 1 g in dextrose 5 % 50 mL IVPB  1 g Intravenous q1800 Hugelmeyer, Alexis, DO      . clopidogrel (PLAVIX) tablet  75 mg  75 mg Oral Daily Hugelmeyer, Alexis, DO   75 mg at 01/14/17 1000  . enoxaparin (LOVENOX) injection 40 mg  40 mg Subcutaneous Q24H Sainani, Vivek J, MD      . fluticasone [facility]) 50 MCG/ACT nasal spray 2 spray  2 spray Each Nare Daily Hugelmeyer, Alexis, DO   2 spray at 01/14/17 1002  . insulin aspart (novoLOG) injection 0-15 Units  0-15 Units Subcutaneous TID WC Sainani, Vivek J, MD      . insulin aspart (novoLOG) injection 0-5 Units  0-5 Units Subcutaneous QHS Sainani, Vivek J, MD      . insulin aspart (novoLOG) injection 6 Units  6 Units Subcutaneous TID WC Sainani, Vivek J, MD      . insulin glargine (LANTUS) injection 20 Units  20 Units Subcutaneous QHS Hugelmeyer, Alexis, DO   20 Units at 01/14/17  0813  . ipratropium (ATROVENT) nebulizer solution 0.5 mg  0.5 mg Nebulization Q6H PRN Hugelmeyer, Alexis, DO      . levothyroxine (SYNTHROID, LEVOTHROID) tablet 100 mcg  100 mcg Oral QAC breakfast Hugelmeyer, Alexis, DO   100 mcg at 01/14/17 0959  . magnesium citrate solution 1 Bottle  1 Bottle Oral Once PRN Hugelmeyer, Alexis, DO      . [START ON 01/15/2017] multivitamin with minerals tablet 1 tablet  1 tablet Oral Daily Sainani, Belia Heman, MD      . ondansetron (ZOFRAN) tablet 4 mg  4 mg Oral Q6H PRN Hugelmeyer, Alexis, DO       Or  . ondansetron (ZOFRAN) injection 4 mg  4 mg Intravenous Q6H PRN Hugelmeyer, Alexis, DO      . oxyCODONE (Oxy IR/ROXICODONE) immediate release tablet 5 mg  5 mg Oral Q4H PRN Hugelmeyer, Alexis, DO      . pantoprazole (PROTONIX) EC tablet 40 mg  40 mg Oral BID Hugelmeyer, Alexis, DO   40 mg at 01/14/17 0959  . potassium chloride SA (K-DUR,KLOR-CON) CR tablet 40 mEq  40 mEq Oral BID Hugelmeyer, Alexis, DO   40 mEq at 01/14/17 0959  . senna-docusate (Senokot-S) tablet 1 tablet  1 tablet Oral QHS PRN Hugelmeyer, Alexis, DO         Discharge Medications: Please see discharge summary for a list of discharge medications.  Relevant Imaging Results:  Relevant Lab Results:   Additional Information ss: [id number]  Shela Leff, LCSW

## 2017-01-14 NOTE — Progress Notes (Signed)
Results for [name] [name], [name] [name] (MRN [id number]) as of 01/14/2017 15:47  Ref. Range 01/14/2017 07:45 01/14/2017 07:58 01/14/2017 09:17 01/14/2017 11:35  Glucose-Capillary Latest Ref Range: 65 - 99 mg/dL 526 (HH) 534 (HH) 446 (H) 325 (H)   Spoke with patient regarding diabetes management.  She states that blood sugars have been high for a while.  She recently saw her endocrinologist, Dr. Fonnie Birkenhead, however patient states that blood sugars have been high since seeing her on 01/08/17.  She states that the MD's have been trying to figure out her blood sugars for over 5 years and that they are very difficult to control.  Patient did not receive Lantus last night due to not eating.  Explained that Lantus should be taken even if not eating in order to meet basal insulin needs.    Will follow.  No further recommendations at this time.  Thanks, Adah Perl, RN, BC-ADM Inpatient Diabetes Coordinator Pager [phone] (8a-5p)

## 2017-01-14 NOTE — Evaluation (Signed)
Physical Therapy Evaluation Patient Details Name: MAKYNZI EASTLAND MRN: [id number] DOB: [birth year] Today's Date: 01/14/2017   History of Present Illness  83 y/o female presents to hospital on 01/13/17 with dizziness, nausea/vomiting, and diarrhea. Pt diagnosed with an acute kidney injury, UTI and gastroenteritis. Pt also hyperglycemic (peaked at 534 on 01/14/17). PMH includes: CAD, COPD, DM, GERD, HLD, CVA, hypothyroid, anxiety, osteoporosis, vitamin D deficiency, breast cancer, IBS, and MI (40% block in LAD).    Clinical Impression  Pt is a pleasant 83 year old admitted for an acute kidney injury/UTI/gastroenteritis. Pt performs bed mobility and transfers with min assist from 1 PT. Pt ambulates with +2 min assist with chair follow for safety (pt notified PT of fatigue right before needing to sit). Ambulated 2 bouts of 48ft with 1 seated rest break (34min). Heavy verbal cueing for upright posture (pt very flexed and shuffling) and increased safety awareness to prevent pt from walking into L wall. Pt demonstrates deficits in strength, functional mobility, and endurance. Would benefit from skilled PT to address above deficits and promote optimal return to PLOF. Pt is motivated to participate in therapy. PT recommends dc to SNF given these deficits and current functional status below her baseline. This entire session was guided, instructed, and directly supervised by Greggory Stallion, DPT.    Follow Up Recommendations SNF    Equipment Recommendations  None recommended by PT    Recommendations for Other Services       Precautions / Restrictions Precautions Precautions: Fall Restrictions Weight Bearing Restrictions: No      Mobility  Bed Mobility Overal bed mobility: Needs Assistance Bed Mobility: Supine to Sit     Supine to sit: Min assist;HOB elevated     General bed mobility comments: Pt able to roll to EOB and sit up partially without assist, but required min assist to scoot to EOB and  to sit upright. Pt dizzy when first sitting up, but this passed within 2-66min.   Transfers Overall transfer level: Needs assistance Equipment used: Rolling walker (2 wheeled) Transfers: Sit to/from Stand Sit to Stand: Min assist         General transfer comment: Verbal cueing for hand placement. Pt struggled to obtain and maintain upright posture once standing. Mild dizziness noted once standing, but this passed quickly.   Ambulation/Gait Ambulation/Gait assistance: Min assist;+2 safety/equipment Ambulation Distance (Feet): 50 Feet (2 bouts of 49ft) Assistive device: Rolling walker (2 wheeled) Gait Pattern/deviations: Step-to pattern;Shuffle;Trunk flexed     General Gait Details: Ambulated 2 bouts of 4ft with RW, +2 min assist, and chair follow. Pt required 1 seated rest break for 69min due to fatigue. Pt did not report fatigue until she needed to sit and rest. Moderate verbal/tactile cueing to prevent pt from walking into L wall. Heavy verbal cueing for upright posture and increasing her step length B.  Stairs            Wheelchair Mobility    Modified Rankin (Stroke Patients Only)       Balance Overall balance assessment: Needs assistance     Sitting balance - Comments: Sat with B UE support and feet on floor. Dizziness noted when EOB, but no LOB/unsteadiness noted.       Standing balance comment: Stood with B UE support on RW and min assist from PT. Mild dizziness once standing, but no increased sway or LOB noted.  Pertinent Vitals/Pain Pain Assessment: No/denies pain    Home Living Family/patient expects to be discharged to:: Private residence Living Arrangements: Children (81 y/o son, he ambulates with cane/doesn't drive due to CVA.) Available Help at Discharge: Family Type of Home: House Home Access: Stairs to enter Entrance Stairs-Rails: Can reach both Entrance Stairs-Number of Steps: 3 Home Layout: One  level Home Equipment: [place] - 2 wheels;Shower seat;Cane - single point      Prior Function Level of Independence: Independent with assistive device(s)         Comments: Ambulates with RW. Currently receiving home health PT. Pt does not currently drive.     Hand Dominance        Extremity/Trunk Assessment   Upper Extremity Assessment Upper Extremity Assessment: Generalized weakness (4/5 B for grip, elbow flex/ext)    Lower Extremity Assessment Lower Extremity Assessment: Generalized weakness (grossly 4/5 B for PF/DF, knee flex/ext, and hip ext)       Communication   Communication: HOH  Cognition Arousal/Alertness: Awake/alert Behavior During Therapy: WFL for tasks assessed/performed Overall Cognitive Status: No family/caregiver present to determine baseline cognitive functioning                                 General Comments: Pt followed commands inconsistently and lacked safety awareness at times (i.e. reported fatigue as soon as she needed rest break ambulating, moderate verbal/tactile cueing required to prevent pt from ambulating into L wall, etc.)      General Comments      Exercises Other Exercises Other Exercises: Supine ther-ex included: SLR's (2x15 B), bridges (x10). Performed with supervision and heavy verbal cueing for sequencing.   Assessment/Plan    PT Assessment Patient needs continued PT services  PT Problem List Decreased strength;Decreased activity tolerance;Decreased cognition;Decreased safety awareness       PT Treatment Interventions Gait training;Stair training;Therapeutic activities;Therapeutic exercise;Balance training;Patient/family education    PT Goals (Current goals can be found in the Care Plan section)  Acute Rehab PT Goals Patient Stated Goal: to get stronger PT Goal Formulation: With patient Time For Goal Achievement: 01/28/17 Potential to Achieve Goals: Good    Frequency Min 2X/week   Barriers to discharge         Co-evaluation               AM-PAC PT "6 Clicks" Daily Activity  Outcome Measure Difficulty turning over in bed (including adjusting bedclothes, sheets and blankets)?: A Lot Difficulty moving from lying on back to sitting on the side of the bed? : Total Difficulty sitting down on and standing up from a chair with arms (e.g., wheelchair, bedside commode, etc,.)?: Total Help needed moving to and from a bed to chair (including a wheelchair)?: A Little Help needed walking in hospital room?: A Little Help needed climbing 3-5 steps with a railing? : A Lot 6 Click Score: 12    End of Session Equipment Utilized During Treatment: Gait belt Activity Tolerance: Patient limited by fatigue Patient left: in chair;with call bell/phone within reach;with chair alarm set Nurse Communication: Mobility status PT Visit Diagnosis: Other abnormalities of gait and mobility (R26.89);Dizziness and giddiness (R42);Muscle weakness (generalized) (M62.81)    Time: 4193-7902 PT Time Calculation (min) (ACUTE ONLY): 28 min   Charges:         PT G Codes:        Donaciano Eva, PT, SPT  Marni Griffon 01/14/2017, 4:11 PM

## 2017-01-14 NOTE — Clinical Social Work Note (Signed)
Clinical Social Work Assessment  Patient Details  Name: [name] [name] MRN: [id number] Date of Birth: [birth year]  Date of referral:  01/14/17               Reason for consult:  Facility Placement                Permission sought to share information with:  Facility Sport and exercise psychologist, Family Supports Permission granted to share information::  Yes, Verbal Permission Granted  Name::        Agency::     Relationship::     Contact Information:     Housing/Transportation Living arrangements for the past 2 months:  [place] of Information:  Patient, Other (Comment Required) (grandaughter: [phone]) Patient Interpreter Needed:  None Criminal Activity/Legal Involvement Pertinent to Current Situation/Hospitalization:  No - Comment as needed Significant Relationships:  None Lives with:  Adult Children Do you feel safe going back to the place where you live?  Yes Need for family participation in patient care:  Yes (Comment)  Care giving concerns:  Patient's disabled son lives with her at home.    Social Worker assessment / plan:  CSW informed by PT that they are recommending STR. CSW spoke with patient this afternoon and explained role and purpose of visit. Patient stated that she has been to rehab in the past and that she believes she will need to go again. She stated she went to [facility] last time. She confirmed that her disabled son lives with her and that her granddaughter: [name] [name]: [phone] is her contact. When CSW mentioned that her daughter, [name] [name], was her contact last time, the patient stated that under no circumstances was [name] [name] to be contacted. Patient stated that she and [name] [name] did not agree with things and she did not want her spoken to. CSW contacted patient's granddaughter, [name] [name], per patient's request to update her regarding patient's plan for rehab.   CSW contacted [name] [name] and explained role and purpose of call. [name] [name] stated that she is over at  patient's house assisting in her care from early in the morning until about 6pm at night five days a week. [name] [name] stated that patient's son is typically the contact but that he is out of town in [place] and that last time, [name] [name] was more available. [name] [name] stated that she is in agreement with patient going to rehab and feels as though she will benefit from it.  CSW initiated bed search.   Employment status:  Retired Nurse, adult PT Recommendations:  [place] / Referral to community resources:     Patient/Family's Response to care:  Patient and granddaughter expressed appreciation for CSW assistance.  Patient/Family's Understanding of and Emotional Response to Diagnosis, Current Treatment, and Prognosis:  Patient is aware of her limitations in mobility and is accepting of rehab.  Emotional Assessment Appearance:  Appears stated age Attitude/Demeanor/Rapport:   (pleasant and cooperative) Affect (typically observed):  Accepting, Adaptable Orientation:  Oriented to Self, Oriented to Place, Oriented to  Time, Oriented to Situation Alcohol / Substance use:  Not Applicable Psych involvement (Current and /or in the community):  No (Comment)  Discharge Needs  Concerns to be addressed:  Care Coordination Readmission within the last 30 days:  Yes Current discharge risk:  None Barriers to Discharge:  No Barriers Identified   Shela Leff, LCSW 01/14/2017, 4:11 PM

## 2017-01-14 NOTE — Progress Notes (Signed)
CH met with pt. Pt was calm and delightful. Pt stated that her children had not visited her yet. Pt told [place] that she raised ten children, she mentioned that she lost her daughters to automobile accident and one to cancer, and had many grandchildren. Pt also said she was able to talk to her son who lives in [place], this morning and was expecting her others children to visit her today or sometime tomorrow. [place] to make a follow up visit with this pt as needed.    01/14/17 1400  Clinical Encounter Type  Visited With Patient  Visit Type Initial;Spiritual support;Social support  Referral From [place] Needs Prayer;Emotional   .

## 2017-01-14 NOTE — Progress Notes (Signed)
PT Cancellation Note  Patient Details Name: [name] [name] MRN: [id number] DOB: 1932/05/02   Cancelled Treatment:    Reason Eval/Treat Not Completed: Other (comment): Order received and chart reviewed. PT holding evaluation this AM due to high blood glucose (>500). Will re-attempt evaluation time permitting.   Donaciano Eva, PT, SPT  [place] 01/14/2017, 8:56 AM

## 2017-01-14 NOTE — Progress Notes (Addendum)
Notified by NT of accucheck noted to be 526 and 529 when repeated. Rn checked and was noted to be 3534. Will give a total of 15 units of novolog and will give lantus dose that was missed last night. Will administer and re check blood sugar.   RE check of blood sugar was 446. Per MD order will give one time dose of additional 10 units.

## 2017-01-14 NOTE — Progress Notes (Signed)
Initial Nutrition Assessment  DOCUMENTATION CODES:   Non-severe (moderate) malnutrition in context of chronic illness  INTERVENTION:  Recommend liberalizing diet from Heart Healthy/Carbohydrate Modified to just Carbohydrate Modified in setting of malnutrition.   Discussed importance of obtaining adequate calories and protein to prevent unintentional weight loss and preserve lean body mass. Encouraged adequate intake through meals, snacks, and beverages.  Provide Magic cup TID with meals, each supplement provides 290 kcal and 9 grams of protein.   Provide multivitamin with minerals daily.  NUTRITION DIAGNOSIS:   Malnutrition (Moderate) related to chronic illness (hx of breast cancer, COPD) as evidenced by mild depletion of muscle mass, moderate depletions of muscle mass, mild depletion of body fat, moderate depletion of body fat.  GOAL:   Patient will meet greater than or equal to 90% of their needs  MONITOR:   PO intake, Supplement acceptance, Labs, Weight trends, I & O's  REASON FOR ASSESSMENT:   Malnutrition Screening Tool    ASSESSMENT:   83 year old female with PMHx of DM type 2, hx of breast cancer, OSA, osteoporosis, hypothyroidism, HLD, CAD, vitamin D deficiency, esophageal reflux, hx of MI, COPD, IBS, hx of CVA who presented with weakness, dizziness and found to have UTI, AKI, orthostatic hypotension secondary to dehydration.   Spoke with patient at bedside. She reports her appetite has been poor for a while and she is not sure why. She attempts to eat 3 meals per day, but reports usually only finishing a little more than 50% of the meal. Patient denies any N/V, abdominal pain, constipation/diarrhea, or difficulty chewing/swallowing. She reports she tried Ensure a few days ago and does not like it. She is amenable to trying Magic Cup here to help meet calorie and protein needs.  Patient reports UBW was 148 lbs. She reports she has been losing weight for a while. Per  chart patient was 150.2 lbs on 08/20/2016 and has lost 12.2 lbs (8.1% body weight) over the past 5 months, which is not quite significant for time frame.  Meal Completion: 50%  Medications reviewed and include: Novolog 0-15 units TID, Novolog 0-5 units QHS, Novolog 6 units TID, Lantus 20 units QHS, levothyroxine, pantoprazole, potassium chloride 40 mEq BID, NS @ 75 ml/hr, ceftriaxone.  Labs reviewed: CBG 180-534 past 24 hrs, Sodium 134, CO2 18, BUN 27, Creatinine 1.23. Per chart last night's PM dose of Lantus was held.  Nutrition-Focused physical exam completed. Findings are mild-moderate fat depletion, mild-moderate muscle depletion, and no edema.   Discussed with RN.  Diet Order:  Diet heart healthy/carb modified Room service appropriate? Yes; Fluid consistency: Thin  Skin:  Reviewed, no issues  Last BM:  01/13/2017  Height:   Ht Readings from Last 1 Encounters:  01/13/17 5\' 7"  (1.702 m)    Weight:   Wt Readings from Last 1 Encounters:  01/13/17 138 lb (62.6 kg)    Ideal Body Weight:  61.4 kg  BMI:  Body mass index is 21.61 kg/m.  Estimated Nutritional Needs:   Kcal:  1350-1575 (MSJ x 1.2-1.4)  Protein:  75-90 grams (1.2-1.4 grams/kg)  Fluid:  1.5 L/day (25 ml/kg)  EDUCATION NEEDS:   Education needs addressed  Willey Blade, MS, RD, LDN Pager: [phone] After Hours Pager: [phone]

## 2017-01-14 NOTE — Progress Notes (Signed)
[place] [facility] [place] NAME: [name] [name]    MR#:  [id number]  DATE OF BIRTH:  1931-10-19  SUBJECTIVE:   Patient here due to dizziness, weakness and noted to have a urinary tract infection and also noted to be orthostatic with acute kidney injury. Blood sugar slightly uncontrolled this morning. No other acute complaints presently.  REVIEW OF SYSTEMS:    Review of Systems  Constitutional: Negative for chills and fever.  HENT: Negative for congestion and tinnitus.   Eyes: Negative for blurred vision and double vision.  Respiratory: Negative for cough, shortness of breath and wheezing.   Cardiovascular: Negative for chest pain, orthopnea and PND.  Gastrointestinal: Negative for abdominal pain, diarrhea, nausea and vomiting.  Genitourinary: Negative for dysuria and hematuria.  Neurological: Negative for dizziness, sensory change and focal weakness.  All other systems reviewed and are negative.   Nutrition: Heart healthy/Carb control Tolerating Diet: Yes Tolerating PT: Await Eval.    DRUG ALLERGIES:   Allergies  Allergen Reactions  . Penicillins Rash    Has patient had a PCN reaction causing immediate rash, facial/tongue/throat swelling, SOB or lightheadedness with hypotension: No Has patient had a PCN reaction causing severe rash involving mucus membranes or skin necrosis: No Has patient had a PCN reaction that required hospitalization No Has patient had a PCN reaction occurring within the last 10 years: No If all of the above answers are "NO", then may proceed with Cephalosporin use.     VITALS:  Blood pressure (!) 99/49, pulse 70, temperature 97.5 F (36.4 C), temperature source Oral, resp. rate 18, height 5\' 7"  (1.702 m), weight 62.6 kg (138 lb), SpO2 99 %.  PHYSICAL EXAMINATION:   Physical Exam  GENERAL:  83 y.o.-year-old patient lying in bed in no acute distress.  EYES: Pupils equal, round, reactive to light and  accommodation. No scleral icterus. Extraocular muscles intact.  HEENT: Head atraumatic, normocephalic. Oropharynx and nasopharynx clear.  NECK:  Supple, no jugular venous distention. No thyroid enlargement, no tenderness.  LUNGS: Normal breath sounds bilaterally, no wheezing, rales, rhonchi. No use of accessory muscles of respiration.  CARDIOVASCULAR: S1, S2 normal. No murmurs, rubs, or gallops.  ABDOMEN: Soft, nontender, nondistended. Bowel sounds present. No organomegaly or mass.  EXTREMITIES: No cyanosis, clubbing or edema b/l.    NEUROLOGIC: Cranial nerves II through XII are intact. No focal Motor or sensory deficits b/l.   PSYCHIATRIC: The patient is alert and oriented x 3.  SKIN: No obvious rash, lesion, or ulcer.    LABORATORY PANEL:   CBC  Recent Labs Lab 01/14/17 0502  WBC 5.5  HGB 9.8*  HCT 28.8*  PLT 236   ------------------------------------------------------------------------------------------------------------------  Chemistries   Recent Labs Lab 01/13/17 1516 01/14/17 0502  NA 134* 134*  K 3.2* 4.2  CL 100* 102  CO2 21* 18*  GLUCOSE 193* 487*  BUN 34* 27*  CREATININE 1.44* 1.23*  CALCIUM 9.7 8.6*  MG 1.6*  --   AST 40  --   ALT 25  --   ALKPHOS 108  --   BILITOT 0.7  --    ------------------------------------------------------------------------------------------------------------------  Cardiac Enzymes  Recent Labs Lab 01/13/17 1516  TROPONINI <0.03   ------------------------------------------------------------------------------------------------------------------  RADIOLOGY:  Dg Chest 2 View  Result Date: 01/13/2017 CLINICAL DATA:  History of right breast cancer with vomiting. EXAM: CHEST  2 VIEW COMPARISON:  11/11/2016 FINDINGS: Lungs are clear without airspace disease or pulmonary edema. No large pleural effusions. Thoracic aorta is  heavily calcified. There is some scarring at the lung apices. Heart size is normal. No acute bone  abnormality. IMPRESSION: No active cardiopulmonary disease. Electronically Signed   By: Markus Daft M.D.   On: 01/13/2017 16:25     ASSESSMENT AND PLAN:   83 year old female with past medical history of diabetes, COPD, IBS, previous history of MI, obstructive sleep apnea, history of previous CVA, hypothyroidism who presented to the hospital due to weakness, dizziness and noted to have urinary tract infection with acute kidney injury.  1. Acute kidney injury-secondary to dehydration and urinary tract infection. -Patient given IV fluids and her BUN/creatinine has improved. Continue IV ceftriaxone for UTI. -Renal dose meds, avoid nephrotoxins.  2. Urinary tract infection-continue IV Rocephin, follow urine cultures.  3. Orthostatic hypotension-secondary to dehydration -Continue gentle IV fluids, repeat orthostatics today. Clinically improved.  4. 4. Diarrhea-now resolved. Patient has a recent history of gastroenteritis but clinically asymptomatic now. -We'll DC enteric precautions and stool panel for now.  5. Diabetes type 2 without complication-blood sugars are slightly uncontrolled. -Continue Lantus, moderate dose sliding scale insulin.  Await diabetes coordinator input.  6. Hypothyroidism-continue Synthroid.  7. History of coronary artery disease-no acute chest pain. Continue aspirin, Plavix.  8. Hyperlipidemia-continue atorvastatin.  9. GERD-continue Protonix.  Await physical therapy evaluation.  All the records are reviewed and case discussed with Care Management/Social Worker. Management plans discussed with the patient, family and they are in agreement.  CODE STATUS: Full code  DVT Prophylaxis: Lovenox  TOTAL TIME TAKING CARE OF THIS PATIENT: 30 minutes.   POSSIBLE D/C IN 1-2 DAYS, DEPENDING ON CLINICAL CONDITION.   Henreitta Leber M.D on 01/14/2017 at 1:25 PM  Between 7am to 6pm - Pager - [phone]  After 6pm go to [email or web address] - [facility]  Office  [phone]  CC: Primary care physician; [name], DO

## 2017-01-15 DIAGNOSIS — I951 Orthostatic hypotension: Secondary | ICD-10-CM | POA: Diagnosis not present

## 2017-01-15 DIAGNOSIS — N179 Acute kidney failure, unspecified: Secondary | ICD-10-CM | POA: Diagnosis not present

## 2017-01-15 DIAGNOSIS — N390 Urinary tract infection, site not specified: Secondary | ICD-10-CM | POA: Diagnosis not present

## 2017-01-15 DIAGNOSIS — E860 Dehydration: Secondary | ICD-10-CM | POA: Diagnosis not present

## 2017-01-15 LAB — BASIC METABOLIC PANEL
Anion gap: 4 — ABNORMAL LOW (ref 5–15)
BUN: 21 mg/dL — AB (ref 6–20)
CALCIUM: 8.7 mg/dL — AB (ref 8.9–10.3)
CO2: 25 mmol/L (ref 22–32)
Chloride: 107 mmol/L (ref 101–111)
Creatinine, Ser: 0.88 mg/dL (ref 0.44–1.00)
GFR calc Af Amer: >60 mL/min (ref >60)
GFR, EST NON AFRICAN AMERICAN: 60 mL/min — AB (ref >60)
GLUCOSE: 150 mg/dL — AB (ref 65–99)
Potassium: 3.3 mmol/L — ABNORMAL LOW (ref 3.5–5.1)
SODIUM: 136 mmol/L (ref 135–145)

## 2017-01-15 LAB — GLUCOSE, CAPILLARY
GLUCOSE-CAPILLARY: 172 mg/dL — AB (ref 65–99)
Glucose-Capillary: 213 mg/dL — ABNORMAL HIGH (ref 65–99)
Glucose-Capillary: 201 mg/dL — ABNORMAL HIGH (ref 65–99)
Glucose-Capillary: 217 mg/dL — ABNORMAL HIGH (ref 65–99)

## 2017-01-15 MED ORDER — MIDODRINE HCL 5 MG PO TABS
10.0000 mg | ORAL_TABLET | Freq: Three times a day (TID) | ORAL | Status: DC
  Administered 2017-01-15 – 2017-01-16 (×3): 10 mg via ORAL
  Filled 2017-01-15 (×3): qty 2

## 2017-01-15 NOTE — Clinical Social Work Note (Addendum)
Patient's granddaughter, [name] [name], arrived to to hospital. She had patient willing to go to [facility] but then [facility] informed CSW that patient is in her copay days and she would have a $160 per day copay. They requested 10 days up front. CSW informed [name] [name] who stated that she knows the family is strapped for money right now and that paying the copay whether up front or on payment plan is a major issue. [name] [name] stated she has tried to get a hold of patient's son in [place] and cannot. [name] [name] stated that she will take patient home and make arrangements for someone to stay with her on the weekends when she cannot stay with her. CSW informed her that we would have the physician add a social worker to the home health orders in the event that the family changes their minds and want to pursue placement from home. RN CM notified. Shela Leff MSW,LCSW [phone]

## 2017-01-15 NOTE — Care Management (Signed)
Plan for patient to discharge home when medically cleared.  Patient lives at home with son.  Granddaughter provides transportation to appointments. PT has assessed patient and recommended SNF.  Due to copayements patient has optted to return home. [facility].  Malachy Mood with [facility] notified of admission.  Plan for patient to discharge with resumption of RN, PT, and addition of aide and SW.  RNCM following.

## 2017-01-15 NOTE — Progress Notes (Signed)
Inpatient Diabetes Program Recommendations  AACE/ADA: New Consensus Statement on Inpatient Glycemic Control (2015)  Target Ranges:  Prepandial:   less than 140 mg/dL      Peak postprandial:   less than 180 mg/dL (1-2 hours)      Critically ill patients:  140 - 180 mg/dL   Lab Results  Component Value Date   GLUCAP 239 (H) 01/14/2017   HGBA1C 10.2 (H) 11/07/2016    Review of Glycemic ControlResults for IGNACIA, GENTZLER (MRN [id number]) as of 01/15/2017 09:03  Ref. Range 01/14/2017 09:17 01/14/2017 11:35 01/14/2017 16:57 01/14/2017 21:17  Glucose-Capillary Latest Ref Range: 65 - 99 mg/dL 446 (H) 325 (H) 307 (H) 239 (H)   Diabetes history: Type 2 diabetes Outpatient Diabetes medications: Lantus 20 units q HS, Humalog 6 units breakfast/10 units lunch/ 6 units supper Current orders for Inpatient glycemic control:  Novolog moderate tid with meals and HS, Lantus 20 units q HS Novolog 6 units tid with meals  Inpatient Diabetes Program Recommendations:    Fasting glucose=150 mg/dL this AM.  Note that patient received a total of 40 units of Lantus on 01/14/17.  May consider increasing Lantus to bid dosing? Consider Lantus 15 units bid.  I spoke with patient on 6/20 and she was frustrated with glycemic control. ? If she is dosing consistently or maybe possibly forgetting doses?   Thanks, Adah Perl, RN, BC-ADM Inpatient Diabetes Coordinator Pager [phone] (8a-5p)

## 2017-01-15 NOTE — Clinical Social Work Note (Signed)
CSW met with patient today to extend bed offers to her. Patient was in a very different mood than she was yesterday. Patient agitated today and stated that she is not going to a rehab facility. She then stated she is tired of her family dropping her off and not coming to see her and placing her at different places. She stated that she wants her [name] [name], her granddaughter to come to the hospital so that they can discuss things. CSW contacted [name] [name], who was already in route to the hospital. [name] [name] stated she would speak with her grandmother and that this was something that the family was struggling with because patient does not want to stay in a facility and they have difficulty providing 24/7 care. [name] [name] stated she really isn't the one that makes decisions but that her grandmother only wants her involved and not other family members.  Shela Leff MSW,LCSW [phone]

## 2017-01-15 NOTE — Progress Notes (Signed)
[facility] [place] NAME: [name] [name]    MR#:  [id number]  DATE OF BIRTH:  Jun 14, 1932  SUBJECTIVE:  Hearing difficulty, feeling weak, blood pressure still low in 90s, feels dizzy REVIEW OF SYSTEMS:    Review of Systems  Constitutional: Positive for malaise/fatigue. Negative for chills and fever.  HENT: Negative for congestion and tinnitus.   Eyes: Negative for blurred vision and double vision.  Respiratory: Negative for cough, shortness of breath and wheezing.   Cardiovascular: Negative for chest pain, orthopnea and PND.  Gastrointestinal: Negative for abdominal pain, diarrhea, nausea and vomiting.  Genitourinary: Negative for dysuria and hematuria.  Neurological: Positive for dizziness and weakness. Negative for sensory change and focal weakness.  All other systems reviewed and are negative.   Nutrition: Heart healthy/Carb control Tolerating Diet: Yes Tolerating PT: Await Eval.    DRUG ALLERGIES:   Allergies  Allergen Reactions  . Penicillins Rash    Has patient had a PCN reaction causing immediate rash, facial/tongue/throat swelling, SOB or lightheadedness with hypotension: No Has patient had a PCN reaction causing severe rash involving mucus membranes or skin necrosis: No Has patient had a PCN reaction that required hospitalization No Has patient had a PCN reaction occurring within the last 10 years: No If all of the above answers are "NO", then may proceed with Cephalosporin use.     VITALS:  Blood pressure (!) 99/50, pulse 72, temperature 97.9 F (36.6 C), temperature source Oral, resp. rate 19, height 5\' 7"  (1.702 m), weight 62.6 kg (138 lb), SpO2 99 %.  PHYSICAL EXAMINATION:   Physical Exam  GENERAL:  83 y.o.-year-old patient lying in bed in no acute distress.  EYES: Pupils equal, round, reactive to light and accommodation. No scleral icterus. Extraocular muscles intact.  HEENT: Head atraumatic, normocephalic.  Oropharynx and nasopharynx clear.  NECK:  Supple, no jugular venous distention. No thyroid enlargement, no tenderness.  LUNGS: Normal breath sounds bilaterally, no wheezing, rales, rhonchi. No use of accessory muscles of respiration.  CARDIOVASCULAR: S1, S2 normal. No murmurs, rubs, or gallops.  ABDOMEN: Soft, nontender, nondistended. Bowel sounds present. No organomegaly or mass.  EXTREMITIES: No cyanosis, clubbing or edema b/l.    NEUROLOGIC: Cranial nerves II through XII are intact. No focal Motor or sensory deficits b/l.   PSYCHIATRIC: The patient is alert and oriented x 3.  SKIN: No obvious rash, lesion, or ulcer.    LABORATORY PANEL:   CBC  Recent Labs Lab 01/14/17 0502  WBC 5.5  HGB 9.8*  HCT 28.8*  PLT 236   ------------------------------------------------------------------------------------------------------------------  Chemistries   Recent Labs Lab 01/13/17 1516  01/15/17 0506  NA 134*  < > 136  K 3.2*  < > 3.3*  CL 100*  < > 107  CO2 21*  < > 25  GLUCOSE 193*  < > 150*  BUN 34*  < > 21*  CREATININE 1.44*  < > 0.88  CALCIUM 9.7  < > 8.7*  MG 1.6*  --   --   AST 40  --   --   ALT 25  --   --   ALKPHOS 108  --   --   BILITOT 0.7  --   --   < > = values in this interval not displayed. ------------------------------------------------------------------------------------------------------------------  Cardiac Enzymes  Recent Labs Lab 01/13/17 1516  TROPONINI <0.03   ------------------------------------------------------------------------------------------------------------------  RADIOLOGY:  No results found.   ASSESSMENT AND PLAN:  83 year old female with past  medical history of diabetes, COPD, IBS, previous history of MI, obstructive sleep apnea, history of previous CVA, hypothyroidism who presented to the hospital due to weakness, dizziness and noted to have urinary tract infection with acute kidney injury.  1. Acute kidney injury-secondary to  dehydration and urinary tract infection. -Patient has lost IV line does not want to be stick again - "Renal dose meds, avoid nephrotoxins.  2. E. Coli Urinary tract infection- urine cultures growing e.coli - with no IV will switch her to PO Keflex  3. Orthostatic hypotension-secondary to dehydration -still remains hypotensive. Will try midodrine.  4. Diarrhea-now resolved. Patient has a recent history of gastroenteritis but clinically asymptomatic now.  5. Diabetes type 2 without complication-blood sugars are slightly uncontrolled. -Continue Lantus, moderate dose sliding scale insulin.  Await diabetes coordinator input.  6. Hypothyroidism-continue Synthroid.  7. History of coronary artery disease-no acute chest pain. Continue aspirin, Plavix.  8. Hyperlipidemia-continue atorvastatin.  9. GERD-continue Protonix.  Await physical therapy evaluation.  All the records are reviewed and case discussed with Care Management/Social Worker. Management plans discussed with the patient, family and they are in agreement.  CODE STATUS: Full code  DVT Prophylaxis: Lovenox  TOTAL TIME TAKING CARE OF THIS PATIENT: 30 minutes.   POSSIBLE D/C IN 1 DAYS, DEPENDING ON CLINICAL CONDITION.   Max Sane M.D on 01/15/2017 at 5:53 PM  Between 7am to 6pm - Pager - [phone]  After 6pm go to [email or web address] - Technical brewer [facility]  Office  [phone]  CC: Primary care physician; [name], DO

## 2017-01-16 ENCOUNTER — Telehealth: Payer: Self-pay | Admitting: Family Medicine

## 2017-01-16 DIAGNOSIS — N390 Urinary tract infection, site not specified: Secondary | ICD-10-CM | POA: Diagnosis not present

## 2017-01-16 DIAGNOSIS — N179 Acute kidney failure, unspecified: Secondary | ICD-10-CM | POA: Diagnosis not present

## 2017-01-16 DIAGNOSIS — E119 Type 2 diabetes mellitus without complications: Secondary | ICD-10-CM | POA: Diagnosis not present

## 2017-01-16 DIAGNOSIS — E860 Dehydration: Secondary | ICD-10-CM | POA: Diagnosis not present

## 2017-01-16 DIAGNOSIS — E1310 Other specified diabetes mellitus with ketoacidosis without coma: Secondary | ICD-10-CM | POA: Diagnosis not present

## 2017-01-16 DIAGNOSIS — I951 Orthostatic hypotension: Secondary | ICD-10-CM | POA: Diagnosis not present

## 2017-01-16 DIAGNOSIS — D62 Acute posthemorrhagic anemia: Secondary | ICD-10-CM | POA: Diagnosis not present

## 2017-01-16 LAB — BASIC METABOLIC PANEL
Anion gap: 5 (ref 5–15)
BUN: 15 mg/dL (ref 6–20)
CHLORIDE: 106 mmol/L (ref 101–111)
CO2: 27 mmol/L (ref 22–32)
CREATININE: 0.95 mg/dL (ref 0.44–1.00)
Calcium: 8.6 mg/dL — ABNORMAL LOW (ref 8.9–10.3)
GFR calc Af Amer: >60 mL/min (ref >60)
GFR calc non Af Amer: 54 mL/min — ABNORMAL LOW (ref >60)
Glucose, Bld: 122 mg/dL — ABNORMAL HIGH (ref 65–99)
POTASSIUM: 3.6 mmol/L (ref 3.5–5.1)
Sodium: 138 mmol/L (ref 135–145)

## 2017-01-16 LAB — CBC
HEMATOCRIT: 28.7 % — AB (ref 35.0–47.0)
HEMOGLOBIN: 10 g/dL — AB (ref 12.0–16.0)
MCH: 33.5 pg (ref 26.0–34.0)
MCHC: 35 g/dL (ref 32.0–36.0)
MCV: 95.8 fL (ref 80.0–100.0)
Platelets: 235 10*3/uL (ref 150–440)
RBC: 3 MIL/uL — ABNORMAL LOW (ref 3.80–5.20)
RDW: 14.7 % — ABNORMAL HIGH (ref 11.5–14.5)
WBC: 3.7 10*3/uL (ref 3.6–11.0)

## 2017-01-16 LAB — URINE CULTURE: Culture: >=100000 — AB

## 2017-01-16 LAB — GLUCOSE, CAPILLARY
Glucose-Capillary: 150 mg/dL — ABNORMAL HIGH (ref 65–99)
Glucose-Capillary: 244 mg/dL — ABNORMAL HIGH (ref 65–99)

## 2017-01-16 MED ORDER — CEPHALEXIN 250 MG PO CAPS: 250.0000 mg | ORAL_CAPSULE | Freq: Two times a day (BID) | ORAL | 0 refills | Status: DC

## 2017-01-16 MED ORDER — MIDODRINE HCL 10 MG PO TABS: 10.0000 mg | ORAL_TABLET | Freq: Three times a day (TID) | ORAL | 0 refills | Status: DC

## 2017-01-16 NOTE — Telephone Encounter (Signed)
Izora Gala from [facility] called to schedule pt for 1 week HFU for acute kidney injury. Pt is scheduled for 6/27 @ 8 am.

## 2017-01-16 NOTE — Discharge Instructions (Signed)

## 2017-01-16 NOTE — Telephone Encounter (Signed)
No TCM needed, was on observation only, thanks

## 2017-01-16 NOTE — Care Management (Signed)
Patient to discharge home today.  Home health orders have been placed.  Cheryl with [employer] notified.  RNCM signing off

## 2017-01-16 NOTE — Progress Notes (Signed)
Spoke to RN regarding talking with patient's granddaughter about helping Mrs. Stotts with insulin administration.  Patient states that she has been taking insulin at home however her blood sugars have been high.  However in the hospital, patient's blood sugars have been much more controlled.  She likely needs assistance with remembering insulin doses and amounts.   Thanks, Adah Perl, RN, BC-ADM Inpatient Diabetes Coordinator Pager [phone] (8a-5p)

## 2017-01-16 NOTE — Progress Notes (Signed)
01/16/2017  BP 131/63 (BP Location: Right Arm)   Pulse (!) 55   Temp 98 F (36.7 C) (Oral)   Resp 18   Ht 5\' 7"  (1.702 m)   Wt 62.6 kg (138 lb)   SpO2 97%   BMI 21.61 kg/m  Patient discharged per MD orders. Discharge instructions reviewed with patient and grand-daughter and she verbalized understanding. IV removed per policy. Prescriptions discussed with family member. Discharged via wheelchair escorted by nursing staff.  Almedia Balls, RN

## 2017-01-17 NOTE — Discharge Summary (Signed)
[facility] [place] NAME: Lanyiah Brix    MR#:  [id number]  DATE OF BIRTH:  03-May-1932  DATE OF ADMISSION:  01/13/2017   ADMITTING PHYSICIAN: Harvie Bridge, DO  DATE OF DISCHARGE: 01/16/2017  4:03 PM  PRIMARY CARE PHYSICIAN: [name], DO   ADMISSION DIAGNOSIS:  Dehydration [E86.0] Dizziness [R42] Acute cystitis without hematuria [N30.00] DISCHARGE DIAGNOSIS:  Active Problems:   AKI (acute kidney injury) ([place])  SECONDARY DIAGNOSIS:   Past Medical History:  Diagnosis Date  . Anxiety state, unspecified   . CAD (coronary artery disease)   . Cancer [facility])    2004 Right breast, found on mammogram, radiation therapy, Dr. Bryson Ha, uterine cancer,   . COPD (chronic obstructive pulmonary disease) ([place])   . Diabetes mellitus   . Esophageal reflux   . Heart burn   . Hyperlipidemia   . IBS (irritable bowel syndrome)   . Myocardial infarction [facility])    Cath negative except for 40% occlusion LAD.  Pt not candidate for betablocker or ACEI because of hypotension  . OSA (obstructive sleep apnea)   . Osteoporosis 02/21/09   DEXA scan showed osteoporosis with left femur T-score -2.8.  Marland Kitchen Presbyacusis   . Stroke ([place])   . Thyroid disease    Hypothyroid  . Vitamin D deficiency    HOSPITAL COURSE:  83 year old female with past medical history of diabetes, COPD, IBS, previous history of MI, obstructive sleep apnea, history of previous CVA, hypothyroidism who presented to the hospital due to weakness, dizziness and noted to have urinary tract infection with acute kidney injury.  1. Acute kidney injury-secondary to dehydration and urinary tract infection. -resolved with hydration. Likely prerenal  2. E. Coli Urinary tract infection- urine cultures growing e.coli - treated with appropriate Abx  3. Orthostatic hypotension-secondary to dehydration - midodrine helped and is being D/Ced on same.  4. Diarrhea-now resolved. Patient has  a recent history of gastroenteritis but clinically asymptomatic now.  DISCHARGE CONDITIONS:  stable CONSULTS OBTAINED:   DRUG ALLERGIES:   Allergies  Allergen Reactions  . Penicillins Rash    Has patient had a PCN reaction causing immediate rash, facial/tongue/throat swelling, SOB or lightheadedness with hypotension: No Has patient had a PCN reaction causing severe rash involving mucus membranes or skin necrosis: No Has patient had a PCN reaction that required hospitalization No Has patient had a PCN reaction occurring within the last 10 years: No If all of the above answers are "NO", then may proceed with Cephalosporin use.    DISCHARGE MEDICATIONS:   Allergies as of 01/16/2017      Reactions   Penicillins Rash   Has patient had a PCN reaction causing immediate rash, facial/tongue/throat swelling, SOB or lightheadedness with hypotension: No Has patient had a PCN reaction causing severe rash involving mucus membranes or skin necrosis: No Has patient had a PCN reaction that required hospitalization No Has patient had a PCN reaction occurring within the last 10 years: No If all of the above answers are "NO", then may proceed with Cephalosporin use.      Medication List    TAKE these medications   aspirin EC 81 MG tablet Take 1 tablet (81 mg total) by mouth daily.   atorvastatin 80 MG tablet Commonly known as:  LIPITOR Take 1 tablet (80 mg total) by mouth daily.   cephALEXin 250 MG capsule Commonly known as:  KEFLEX Take 1 capsule (250 mg total) by mouth 2 (two) times  daily.   clopidogrel 75 MG tablet Commonly known as:  PLAVIX TAKE 1 TABLET(75 MG) BY MOUTH DAILY   fluticasone 50 MCG/ACT nasal spray Commonly known as:  FLONASE Place 2 sprays into both nostrils daily.   glucagon 1 MG injection Inject 1 mg into the vein once as needed.   insulin glargine 100 UNIT/ML injection Commonly known as:  LANTUS Inject 20 Units into the skin at bedtime.   insulin lispro 100  UNIT/ML injection Commonly known as:  HUMALOG Inject 6-10 Units into the skin 3 (three) times daily before meals. take 10 units at breakfast, 6 units at lunch, 10 units at supper.   levothyroxine 100 MCG tablet Commonly known as:  SYNTHROID, LEVOTHROID TAKE 1 TABLET(100 MCG) BY MOUTH DAILY   metFORMIN 500 MG tablet Commonly known as:  GLUCOPHAGE TAKE 1 TABLET(500 MG) BY MOUTH TWICE DAILY WITH A MEAL   midodrine 10 MG tablet Commonly known as:  PROAMATINE Take 1 tablet (10 mg total) by mouth 3 (three) times daily with meals.   pantoprazole 40 MG tablet Commonly known as:  PROTONIX Take 1 tablet (40 mg total) by mouth 2 (two) times daily.        DISCHARGE INSTRUCTIONS:   DIET:  Regular diet DISCHARGE CONDITION:  Good ACTIVITY:  Activity as tolerated OXYGEN:  Home Oxygen: No.  Oxygen Delivery: room air DISCHARGE LOCATION:  home   If you experience worsening of your admission symptoms, develop shortness of breath, life threatening emergency, suicidal or homicidal thoughts you must seek medical attention immediately by calling 911 or calling your MD immediately  if symptoms less severe.  You Must read complete instructions/literature along with all the possible adverse reactions/side effects for all the Medicines you take and that have been prescribed to you. Take any new Medicines after you have completely understood and accpet all the possible adverse reactions/side effects.   Please note  You were cared for by a hospitalist during your hospital stay. If you have any questions about your discharge medications or the care you received while you were in the hospital after you are discharged, you can call the unit and asked to speak with the hospitalist on call if the hospitalist that took care of you is not available. Once you are discharged, your primary care physician will handle any further medical issues. Please note that NO REFILLS for any discharge medications will be  authorized once you are discharged, as it is imperative that you return to your primary care physician (or establish a relationship with a primary care physician if you do not have one) for your aftercare needs so that they can reassess your need for medications and monitor your lab values.    On the day of Discharge:  VITAL SIGNS:  Blood pressure 131/63, pulse (!) 55, temperature 98 F (36.7 C), temperature source Oral, resp. rate 18, height 5\' 7"  (1.702 m), weight 62.6 kg (138 lb), SpO2 97 %. PHYSICAL EXAMINATION:  GENERAL:  83 y.o.-year-old patient lying in the bed with no acute distress.  EYES: Pupils equal, round, reactive to light and accommodation. No scleral icterus. Extraocular muscles intact.  HEENT: Head atraumatic, normocephalic. Oropharynx and nasopharynx clear.  NECK:  Supple, no jugular venous distention. No thyroid enlargement, no tenderness.  LUNGS: Normal breath sounds bilaterally, no wheezing, rales,rhonchi or crepitation. No use of accessory muscles of respiration.  CARDIOVASCULAR: S1, S2 normal. No murmurs, rubs, or gallops.  ABDOMEN: Soft, non-tender, non-distended. Bowel sounds present. No organomegaly or mass.  EXTREMITIES: No pedal edema, cyanosis, or clubbing.  NEUROLOGIC: Cranial nerves II through XII are intact. Muscle strength 5/5 in all extremities. Sensation intact. Gait not checked.  PSYCHIATRIC: The patient is alert and oriented x 3.  SKIN: No obvious rash, lesion, or ulcer.  DATA REVIEW:   CBC  Recent Labs Lab 01/16/17 0444  WBC 3.7  HGB 10.0*  HCT 28.7*  PLT 235    Chemistries   Recent Labs Lab 01/13/17 1516  01/16/17 0444  NA 134*  < > 138  K 3.2*  < > 3.6  CL 100*  < > 106  CO2 21*  < > 27  GLUCOSE 193*  < > 122*  BUN 34*  < > 15  CREATININE 1.44*  < > 0.95  CALCIUM 9.7  < > 8.6*  MG 1.6*  --   --   AST 40  --   --   ALT 25  --   --   ALKPHOS 108  --   --   BILITOT 0.7  --   --   < > = values in this interval not  displayed.   Microbiology Results   Urine c/s grew E.Coli  Follow-up Information    [name], DO. Go on 01/21/2017.   Specialty:  Family Medicine Why:  @8am  Contact information: [address] [place] [address] [phone]           Management plans discussed with the patient, family and they are in agreement.  CODE STATUS: Prior   TOTAL TIME TAKING CARE OF THIS PATIENT: 45 minutes.    Max Sane M.D on 01/17/2017 at 7:02 PM  Between 7am to 6pm - Pager - [phone]  After 6pm go to [email or web address] - Proofreader  [facility]  Office  [phone]  CC: Primary care physician; [name], DO   Note: This dictation was prepared with Dragon dictation along with smaller phrase technology. Any transcriptional errors that result from this process are unintentional.

## 2017-01-19 ENCOUNTER — Other Ambulatory Visit: Payer: Self-pay | Admitting: *Deleted

## 2017-01-19 ENCOUNTER — Ambulatory Visit (INDEPENDENT_AMBULATORY_CARE_PROVIDER_SITE_OTHER): Payer: Medicare Other | Admitting: Pharmacist

## 2017-01-19 ENCOUNTER — Encounter: Payer: Self-pay | Admitting: Pharmacist

## 2017-01-19 DIAGNOSIS — E1151 Type 2 diabetes mellitus with diabetic peripheral angiopathy without gangrene: Secondary | ICD-10-CM | POA: Diagnosis not present

## 2017-01-19 DIAGNOSIS — E1165 Type 2 diabetes mellitus with hyperglycemia: Secondary | ICD-10-CM | POA: Diagnosis not present

## 2017-01-19 DIAGNOSIS — IMO0002 Reserved for concepts with insufficient information to code with codable children: Secondary | ICD-10-CM

## 2017-01-19 NOTE — Assessment & Plan Note (Signed)
Diabetes longstanding diagnosed currently uncontrolled. Patient reports hypoglycemic events and is able to verbalize appropriate hypoglycemia management plan. Patient reports adherence with medication. Control is suboptimal due to insulin resistance, and variability of CBGs ranging from severe hyperglycemia to hyperglycemia. Following discussion and approval by Dr Lacinda Axon, the following medication changes were made:  -Increase Lantus to 22 units daily.  Conservative insulin increase chosen due to patient variable hypoglycemic CBGs with unknown cause despite markedly elevated fasting CBGs on most days.  -Humalog 10 units before breakfast, 6 units before lunch and 10 units before supper plus With additional sliding scale 200-249 1 unit   250-299 3 units   300-349 5 units    350-399 7 units   >400 9 units  -Counseled on signs/symptoms/treatment of hypoglycemia.  -Instructed patient/caregiver to obtain followup  -Will followup with Dr Lacinda Axon about Freestyle Libre CGM to be sent to [facility] DME supplier  -Next A1C anticipated 01/2017.

## 2017-01-19 NOTE — Patient Outreach (Signed)
Unsuccessful telephone outreach to contact [name] [name] (on consent form, caregiver, per pt considers great granddaughter)- attempt to follow up on pt's recent hospitalization June 19-22, 2018 for Dehydration, dizziness, Acute cystitis without hematuria, Acute kidney  Injury.   HIPAA compliant voice message left with contact name and number.  Plan:  If no response, plan to follow up again tomorrow.    [name]   [facility] [facility]  [phone]

## 2017-01-19 NOTE — Progress Notes (Signed)
S:     Chief Complaint  Patient presents with  . Medication Management    Diabetes    Patient arrives ambulating with a walker accompanied by her granddaughter.  Presents for diabetes evaluation, education, and management at the request of Dr Lacinda Axon. Patient was referred on 01/01/17.  Patient was last seen by Primary Care Provider on 01/01/17 with pending followup on 01/21/17.  Patient was recently hospitalized on 6/19 with acute kidney injury, UTI and orthostatic hypotension (possibly due to dehydration.  She also recently saw endocrinology on 6/14 and a sliding scale was added in addition to further insulin titration.    Completed Keflex for UTI - she states she is feeling much better and most of urinary symptoms have discontinued.  She does have a history of UTIs. Granddaughter reports lack of bladder control.    Daughter states patient has a history of low blood pressure and was recently started on midodrine.    Patient reports Diabetes was diagnosed in ~2010. Has been on insulin since diagnosis.  She states her son helps with paying for her medications.   Family/Social History: History of Stroke in December.    Patient reports adherence with medications. Granddaughter is with her Monday thru Friday an assists her with medications.  Current diabetes medications include: Lantus 20 units daily, Humalog 10 units before breakfast, 6 units before lunch and 10 units before supper plus With additional sliding scale    200-249 1 unit   250-299 3 units   300-349 5 units    350-399 7 units   >400 9 units   Current hypertension medications include: none - on midodrine for orthostasis  Patient denies hypoglycemic events. Does have a value of 50 and 70 on her CBG log.  Has symptoms of dizzy, shakiness, feels like she is going to faint when in the 50s.  Granddaughter believes hypoglycemia due to patient not eating.   Patient reported dietary habits: Eats 3 meals/day Breakfast:2 eggs, piece of  wheat toast and [place] bacon Lunch:chicken salad with crackers Dinner:chicken salad or meat with 2 vegetables  Snacks:avoids sweets - stopped late night snacks per endocrinologist request.   Drinks:coffee with cream, unsweet tea, water  Grand daughter reports appetite is variable.    Patient reported exercise habits: uses walker.     Patient denies nocturia.  Patient reports occasional neuropathy. Reports occasional sharp pain going up her legs.  Patient denies visual changes. Patient reports self foot exams.   Patient was recently discharged from hospital and all medications have been reviewed.  O:  Physical Exam  Vitals reviewed.  Review of Systems  Constitutional: Negative.    Lab Results  Component Value Date   HGBA1C 10.2 (H) 11/07/2016   Vitals:   01/19/17 1419  BP: 96/65  Pulse: 74    Home fasting CBG: 94, 486, 294, 578, 587, 596, 572, 57, 493, 159  2 hour post-prandial after breakfast: 89, 539, 444, 501, 103, 352, 497 Before lunch: 70, 273, 144, 362, 381, 313, 217, 163, 250, 285, 497 2 hours post prandial after lunch: 196, 252, 238, 177, 102, 96, 485, 76, 286 Before Dinner: 263, 256, 121, 256, 178, 191, 112, 567, 204, 286 2 hours post prandial after dinner: 80, 287, 600, 50, 333, 96, 155, 299 At bedtime CBG: 272, 565, 80, 257, 164, 151, 161.   A/P: Diabetes longstanding diagnosed currently uncontrolled. Patient reports hypoglycemic events and is able to verbalize appropriate hypoglycemia management plan. Patient reports adherence with medication.  Control is suboptimal due to insulin resistance, and variability of CBGs ranging from severe hyperglycemia to hyperglycemia. Following discussion and approval by Dr Lacinda Axon, the following medication changes were made:  -Increase Lantus to 22 units daily.  Conservative insulin increase chosen due to patient variable hypoglycemic CBGs with unknown cause despite markedly elevated fasting CBGs on most days.  -Humalog 10 units  before breakfast, 6 units before lunch and 10 units before supper plus With additional sliding scale 200-249 1 unit   250-299 3 units   300-349 5 units    350-399 7 units   >400 9 units  -Counseled on signs/symptoms/treatment of hypoglycemia.  -Instructed patient/caregiver to obtain followup  -Will followup with Dr Lacinda Axon about Freestyle Libre CGM to be sent to [facility] DME supplier  -Next A1C anticipated 01/2017.    ASCVD risk greater than 7.5% with history of stroke. Continued Aspirin 81 mg, clopidogrel and Continued atorvastatin 80 mg.   Hypertension longstanding diagnosed currently controlled. Patient currently on midodrine for orthostasis.  Continue current treatment. .    Written patient instructions provided.  Total time in face to face counseling 60 minutes.   Follow up with Dr Lacinda Axon this week and followup with endocrinology as soon as possible.     Patient was seen with Dr Lacinda Axon today in clinic and medication changes were discussed and approved prior to initiation

## 2017-01-19 NOTE — Patient Instructions (Signed)
Increase Lantus to 22 units daily  Continue Humalog dosing  Followup with endocrinologist as directed.   I will call you in 1 week to followup blood sugars.  Call clinic if you have any low blood sugars.

## 2017-01-20 ENCOUNTER — Encounter: Payer: Self-pay | Admitting: *Deleted

## 2017-01-20 ENCOUNTER — Other Ambulatory Visit: Payer: Self-pay | Admitting: *Deleted

## 2017-01-20 DIAGNOSIS — E039 Hypothyroidism, unspecified: Secondary | ICD-10-CM | POA: Diagnosis not present

## 2017-01-20 DIAGNOSIS — E785 Hyperlipidemia, unspecified: Secondary | ICD-10-CM | POA: Diagnosis not present

## 2017-01-20 DIAGNOSIS — M810 Age-related osteoporosis without current pathological fracture: Secondary | ICD-10-CM | POA: Diagnosis not present

## 2017-01-20 DIAGNOSIS — K219 Gastro-esophageal reflux disease without esophagitis: Secondary | ICD-10-CM | POA: Diagnosis not present

## 2017-01-20 DIAGNOSIS — E1151 Type 2 diabetes mellitus with diabetic peripheral angiopathy without gangrene: Secondary | ICD-10-CM | POA: Diagnosis not present

## 2017-01-20 DIAGNOSIS — G4733 Obstructive sleep apnea (adult) (pediatric): Secondary | ICD-10-CM | POA: Diagnosis not present

## 2017-01-20 DIAGNOSIS — E1165 Type 2 diabetes mellitus with hyperglycemia: Secondary | ICD-10-CM | POA: Diagnosis not present

## 2017-01-20 DIAGNOSIS — Z8673 Personal history of transient ischemic attack (TIA), and cerebral infarction without residual deficits: Secondary | ICD-10-CM | POA: Diagnosis not present

## 2017-01-20 DIAGNOSIS — Z794 Long term (current) use of insulin: Secondary | ICD-10-CM | POA: Diagnosis not present

## 2017-01-20 DIAGNOSIS — R531 Weakness: Secondary | ICD-10-CM | POA: Diagnosis not present

## 2017-01-20 DIAGNOSIS — I2510 Atherosclerotic heart disease of native coronary artery without angina pectoris: Secondary | ICD-10-CM | POA: Diagnosis not present

## 2017-01-20 DIAGNOSIS — J449 Chronic obstructive pulmonary disease, unspecified: Secondary | ICD-10-CM | POA: Diagnosis not present

## 2017-01-20 DIAGNOSIS — D649 Anemia, unspecified: Secondary | ICD-10-CM | POA: Diagnosis not present

## 2017-01-20 NOTE — Progress Notes (Signed)
Care was provided under my supervision. I agree with the management as indicated in the note.  Keishawna Carranza DO  

## 2017-01-20 NOTE — Patient Outreach (Signed)
Successful telephone encounter to [name] [name] (on consent form, per pt considers great granddaughter), following up on pt's recent hospitalization 6/19-6/22 for dehydration, dizziness, acute cystitis without hematuria, Acute Kidney injury.   Spoke with [name], HIPAA identifiers provided on pt, discussed purpose of call - follow up on recent hospitalization.   [name] reports since pt's discharge home, making sure she is hydrated, pt completed antibiotic for UTI yesterday - urine clear.  [name] reports pt saw Dr. Jonathon Jordan pharmacist yesterday for medication consult, pharmacist  increased pt's Lantus insulin to 22 units at night which she prepared for pt to take last night but pt forgot.  [name] reports this morning pt's sugar was 397, had her recheck at 11:30 am result was 216.  [name] reports even with reminders from son at night, pt will sometimes refuse to take her insulin, 6/22 pt's sugars were good/weekend was not with pt, yesterday in am 98, dropped to 70 at lunch/held insulin and gave pt something to eat/ at dinner came up to 315, last night 189.   [name] reports Dr. Jonathon Jordan pharmacist put in an order for a continuous glucose monitor, waiting to see if insurance approves- if approved will put in pt -MD can then track pt's  sugars constantly/pharmacist  will show me how to use it.  [name] reports continues to fill pt's pill planner.  Patient was recently discharged from hospital and all medications have been reviewed.  Plan:  As discussed with [name], plan to continue to follow pt for transition of care, home visit scheduled for next week.            Plan to send update letter to Dr. Lacinda Axon, informing to follow pt for transition of care.   [name]   [facility]  [phone]

## 2017-01-21 ENCOUNTER — Encounter: Payer: Self-pay | Admitting: Family Medicine

## 2017-01-21 ENCOUNTER — Other Ambulatory Visit: Payer: Self-pay | Admitting: *Deleted

## 2017-01-21 ENCOUNTER — Ambulatory Visit (INDEPENDENT_AMBULATORY_CARE_PROVIDER_SITE_OTHER): Payer: Medicare Other | Admitting: Family Medicine

## 2017-01-21 VITALS — BP 104/64 | HR 82 | Temp 98.3°F | Wt 138.4 lb

## 2017-01-21 DIAGNOSIS — D649 Anemia, unspecified: Secondary | ICD-10-CM

## 2017-01-21 DIAGNOSIS — N179 Acute kidney failure, unspecified: Secondary | ICD-10-CM | POA: Diagnosis not present

## 2017-01-21 LAB — COMPREHENSIVE METABOLIC PANEL
ALBUMIN: 3.9 g/dL (ref 3.5–5.2)
ALK PHOS: 140 U/L — AB (ref 39–117)
ALT: 53 U/L — ABNORMAL HIGH (ref 0–35)
AST: 43 U/L — ABNORMAL HIGH (ref 0–37)
BUN: 24 mg/dL — ABNORMAL HIGH (ref 6–23)
CHLORIDE: 101 meq/L (ref 96–112)
CO2: 27 mEq/L (ref 19–32)
Calcium: 9.8 mg/dL (ref 8.4–10.5)
Creatinine, Ser: 1.19 mg/dL (ref 0.40–1.20)
GFR: 46.06 mL/min — AB (ref >60.00)
Glucose, Bld: 123 mg/dL — ABNORMAL HIGH (ref 70–99)
POTASSIUM: 3.8 meq/L (ref 3.5–5.1)
Sodium: 136 mEq/L (ref 135–145)
TOTAL PROTEIN: 6.9 g/dL (ref 6.0–8.3)
Total Bilirubin: 0.5 mg/dL (ref 0.2–1.2)

## 2017-01-21 LAB — CBC
HEMATOCRIT: 32.3 % — AB (ref 36.0–46.0)
HEMOGLOBIN: 10.9 g/dL — AB (ref 12.0–15.0)
MCHC: 33.8 g/dL (ref 30.0–36.0)
MCV: 96.7 fl (ref 78.0–100.0)
Platelets: 283 10*3/uL (ref 150.0–400.0)
RBC: 3.34 Mil/uL — AB (ref 3.87–5.11)
RDW: 14.8 % (ref 11.5–15.5)
WBC: 4.8 10*3/uL (ref 4.0–10.5)

## 2017-01-21 NOTE — Patient Outreach (Signed)
[facility] Care Management  01/21/2017  [name] [name] April 15, 1932 [id number]   Phone call to patient's grand daughter Criss Alvine on [facility] consent to explore additional community resource needs. Per patient's granddaughter, she provides care for patient 5 days per week, however she does not have consistent care on the weekends.  [name] discussed the possibility of patient applying for medicaid, however in review of her income, she does not meet the eligibility requirements.  Per [name], she will discuss this need for more care on the weekends with patient's son in [place] on a private pay basis.   Plan: This Education officer, museum will discuss case with RNCM and Pharmacist before case is closed to social work.    Sheralyn Boatman [facility] Care Management [phone]

## 2017-01-21 NOTE — Assessment & Plan Note (Signed)
Recent admission. Hospital course summarized in history of present illness. Medications reviewed and reconciled today. Patient doing well. Last creatinine was within normal limits. Rechecking today. Continue current medications.

## 2017-01-21 NOTE — Progress Notes (Signed)
Subjective:  Patient ID: [name] [name], female    DOB: 1931-08-05  Age: 83 y.o. MRN: [id number]  CC: Hospital follow up  HPI:  83 year old female with CAD, uncontrolled DM-2, PVD, Stroke, GERD, CKD, Hypothyroidism, HLD presents for hospital follow up.  Patient recently admitted from 6/19-6/22. She presented with weakness and dizziness as well as nausea and vomiting. She had no urinary symptoms but had a positive culture. Found to have acute kidney injury and was treated for UTI. Patient did well during hospitalization. Received IV fluids with resolution of acute kidney injury. UTI was treated with Rocephin. Additionally, she was found to have low BP. She was discharged on Midodrine.  Patient presents today for follow-up. She states that she was a little "shaky" this morning. No hypoglycemia. She states that she otherwise feels fine. No urinary symptoms. She endorses compliance with her medications. [name] no other complaints or concerns at this time.  Social Hx   Social History   Social History  . Marital status: Single    Spouse name: N/A  . Number of children: N/A  . Years of education: N/A   Occupational History  . retired    Social History Main Topics  . Smoking status: Never Smoker  . Smokeless tobacco: Never Used  . Alcohol use No  . Drug use: No  . Sexual activity: Not Asked   Other Topics Concern  . None   Social History Narrative  . None    Review of Systems  Constitutional: Negative.   Genitourinary: Negative.    Objective:  BP 104/64 (BP Location: Left Arm, Patient Position: Sitting, Cuff Size: Normal)   Pulse 82   Temp 98.3 F (36.8 C) (Oral)   Wt 138 lb 6 oz (62.8 kg)   SpO2 96%   BMI 21.67 kg/m   BP/Weight 01/21/2017 01/19/2017 09/08/9415  Systolic BP 408 96 144  Diastolic BP 64 65 63  Wt. (Lbs) 138.38 140 -  BMI 21.67 21.93 -   Physical Exam  Constitutional: She appears well-developed. No distress.  Cardiovascular: Normal rate and regular  rhythm.   Pulmonary/Chest: Effort normal and breath sounds normal. [name] no wheezes. [name] no rales.  Abdominal: Soft. She exhibits no distension. There is no tenderness. There is no rebound and no guarding.  Neurological: She is alert.  Psychiatric: [name] a normal mood and affect.  Vitals reviewed.   Lab Results  Component Value Date   WBC 3.7 01/16/2017   HGB 10.0 (L) 01/16/2017   HCT 28.7 (L) 01/16/2017   PLT 235 01/16/2017   GLUCOSE 122 (H) 01/16/2017   CHOL 204 (H) 08/20/2016   TRIG 76.0 08/20/2016   HDL 110.60 08/20/2016   LDLDIRECT 138.1 06/20/2013   LDLCALC 79 08/20/2016   ALT 25 01/13/2017   AST 40 01/13/2017   NA 138 01/16/2017   K 3.6 01/16/2017   CL 106 01/16/2017   CREATININE 0.95 01/16/2017   BUN 15 01/16/2017   CO2 27 01/16/2017   TSH 2.49 08/20/2016   INR 1.15 11/06/2016   HGBA1C 10.2 (H) 11/07/2016   MICROALBUR 1.3 04/11/2016    Assessment & Plan:   Problem List Items Addressed This Visit      Genitourinary   AKI (acute kidney injury) ([place]) - Primary    Recent admission. Hospital course summarized in history of present illness. Medications reviewed and reconciled today. Patient doing well. Last creatinine was within normal limits. Rechecking today. Continue current medications.  Relevant Orders   Comp Met (CMET)    Other Visit Diagnoses    Anemia, unspecified type       Relevant Orders   CBC   Iron, TIBC and Ferritin Panel     Follow-up: 1 month  Lilly

## 2017-01-21 NOTE — Patient Instructions (Signed)
We will call with the lab results.  Continue to follow up with my pharmacist.  Follow up with me in 1 month.  Take care  Dr. Lacinda Axon

## 2017-01-22 ENCOUNTER — Other Ambulatory Visit: Payer: Self-pay | Admitting: Family Medicine

## 2017-01-22 LAB — IRON,TIBC AND FERRITIN PANEL
%SAT: 22 % (ref 11–50)
FERRITIN: 23 ng/mL (ref 20–288)
IRON: 79 ug/dL (ref 45–160)
TIBC: 362 ug/dL (ref 250–450)

## 2017-01-23 DIAGNOSIS — Z8673 Personal history of transient ischemic attack (TIA), and cerebral infarction without residual deficits: Secondary | ICD-10-CM | POA: Diagnosis not present

## 2017-01-23 DIAGNOSIS — K219 Gastro-esophageal reflux disease without esophagitis: Secondary | ICD-10-CM | POA: Diagnosis not present

## 2017-01-23 DIAGNOSIS — E1151 Type 2 diabetes mellitus with diabetic peripheral angiopathy without gangrene: Secondary | ICD-10-CM | POA: Diagnosis not present

## 2017-01-23 DIAGNOSIS — E039 Hypothyroidism, unspecified: Secondary | ICD-10-CM | POA: Diagnosis not present

## 2017-01-23 DIAGNOSIS — R531 Weakness: Secondary | ICD-10-CM | POA: Diagnosis not present

## 2017-01-23 DIAGNOSIS — J449 Chronic obstructive pulmonary disease, unspecified: Secondary | ICD-10-CM | POA: Diagnosis not present

## 2017-01-23 DIAGNOSIS — I2510 Atherosclerotic heart disease of native coronary artery without angina pectoris: Secondary | ICD-10-CM | POA: Diagnosis not present

## 2017-01-23 DIAGNOSIS — E785 Hyperlipidemia, unspecified: Secondary | ICD-10-CM | POA: Diagnosis not present

## 2017-01-23 DIAGNOSIS — M810 Age-related osteoporosis without current pathological fracture: Secondary | ICD-10-CM | POA: Diagnosis not present

## 2017-01-23 DIAGNOSIS — D649 Anemia, unspecified: Secondary | ICD-10-CM | POA: Diagnosis not present

## 2017-01-23 DIAGNOSIS — E1165 Type 2 diabetes mellitus with hyperglycemia: Secondary | ICD-10-CM | POA: Diagnosis not present

## 2017-01-23 DIAGNOSIS — G4733 Obstructive sleep apnea (adult) (pediatric): Secondary | ICD-10-CM | POA: Diagnosis not present

## 2017-01-23 DIAGNOSIS — Z794 Long term (current) use of insulin: Secondary | ICD-10-CM | POA: Diagnosis not present

## 2017-01-26 DIAGNOSIS — K219 Gastro-esophageal reflux disease without esophagitis: Secondary | ICD-10-CM | POA: Diagnosis not present

## 2017-01-26 DIAGNOSIS — E039 Hypothyroidism, unspecified: Secondary | ICD-10-CM | POA: Diagnosis not present

## 2017-01-26 DIAGNOSIS — Z794 Long term (current) use of insulin: Secondary | ICD-10-CM | POA: Diagnosis not present

## 2017-01-26 DIAGNOSIS — E1151 Type 2 diabetes mellitus with diabetic peripheral angiopathy without gangrene: Secondary | ICD-10-CM | POA: Diagnosis not present

## 2017-01-26 DIAGNOSIS — E1165 Type 2 diabetes mellitus with hyperglycemia: Secondary | ICD-10-CM | POA: Diagnosis not present

## 2017-01-26 DIAGNOSIS — E785 Hyperlipidemia, unspecified: Secondary | ICD-10-CM | POA: Diagnosis not present

## 2017-01-26 DIAGNOSIS — J449 Chronic obstructive pulmonary disease, unspecified: Secondary | ICD-10-CM | POA: Diagnosis not present

## 2017-01-26 DIAGNOSIS — M810 Age-related osteoporosis without current pathological fracture: Secondary | ICD-10-CM | POA: Diagnosis not present

## 2017-01-26 DIAGNOSIS — G4733 Obstructive sleep apnea (adult) (pediatric): Secondary | ICD-10-CM | POA: Diagnosis not present

## 2017-01-26 DIAGNOSIS — R531 Weakness: Secondary | ICD-10-CM | POA: Diagnosis not present

## 2017-01-26 DIAGNOSIS — I2510 Atherosclerotic heart disease of native coronary artery without angina pectoris: Secondary | ICD-10-CM | POA: Diagnosis not present

## 2017-01-26 DIAGNOSIS — D649 Anemia, unspecified: Secondary | ICD-10-CM | POA: Diagnosis not present

## 2017-01-26 DIAGNOSIS — Z8673 Personal history of transient ischemic attack (TIA), and cerebral infarction without residual deficits: Secondary | ICD-10-CM | POA: Diagnosis not present

## 2017-01-29 ENCOUNTER — Other Ambulatory Visit: Payer: Self-pay | Admitting: *Deleted

## 2017-01-29 DIAGNOSIS — G4733 Obstructive sleep apnea (adult) (pediatric): Secondary | ICD-10-CM | POA: Diagnosis not present

## 2017-01-29 DIAGNOSIS — E785 Hyperlipidemia, unspecified: Secondary | ICD-10-CM | POA: Diagnosis not present

## 2017-01-29 DIAGNOSIS — E1165 Type 2 diabetes mellitus with hyperglycemia: Secondary | ICD-10-CM | POA: Diagnosis not present

## 2017-01-29 DIAGNOSIS — J449 Chronic obstructive pulmonary disease, unspecified: Secondary | ICD-10-CM | POA: Diagnosis not present

## 2017-01-29 DIAGNOSIS — E1151 Type 2 diabetes mellitus with diabetic peripheral angiopathy without gangrene: Secondary | ICD-10-CM | POA: Diagnosis not present

## 2017-01-29 DIAGNOSIS — I2510 Atherosclerotic heart disease of native coronary artery without angina pectoris: Secondary | ICD-10-CM | POA: Diagnosis not present

## 2017-01-29 DIAGNOSIS — Z794 Long term (current) use of insulin: Secondary | ICD-10-CM | POA: Diagnosis not present

## 2017-01-29 DIAGNOSIS — Z8673 Personal history of transient ischemic attack (TIA), and cerebral infarction without residual deficits: Secondary | ICD-10-CM | POA: Diagnosis not present

## 2017-01-29 DIAGNOSIS — R531 Weakness: Secondary | ICD-10-CM | POA: Diagnosis not present

## 2017-01-29 DIAGNOSIS — K219 Gastro-esophageal reflux disease without esophagitis: Secondary | ICD-10-CM | POA: Diagnosis not present

## 2017-01-29 DIAGNOSIS — D649 Anemia, unspecified: Secondary | ICD-10-CM | POA: Diagnosis not present

## 2017-01-29 DIAGNOSIS — M810 Age-related osteoporosis without current pathological fracture: Secondary | ICD-10-CM | POA: Diagnosis not present

## 2017-01-29 DIAGNOSIS — E039 Hypothyroidism, unspecified: Secondary | ICD-10-CM | POA: Diagnosis not present

## 2017-01-29 NOTE — Patient Outreach (Signed)
[facility]) Care Management  [facility] Social Work  01/29/2017  LAMONICA TRUEBA 1932-02-02 [id number]  Subjective:  Patient is a 83 year old female who currently lives with her son. Patient has a caregivier [name] who provides care routinely  Monday-Friday, however weekend assistance is inconsistent. Patient  having difficulty with medication adherence, mainly her insulin on the weekends. Patient admits to rebelling against taking her medication when it is drawn up and left for her to take independently.     Objective:   Encounter Medications:  Outpatient Encounter Prescriptions as of 01/29/2017  Medication Sig Note  . aspirin EC 81 MG tablet Take 1 tablet (81 mg total) by mouth daily.   Marland Kitchen atorvastatin (LIPITOR) 80 MG tablet Take 1 tablet (80 mg total) by mouth daily.   . clopidogrel (PLAVIX) 75 MG tablet TAKE 1 TABLET(75 MG) BY MOUTH DAILY   . clopidogrel (PLAVIX) 75 MG tablet TAKE 1 TABLET(75 MG) BY MOUTH DAILY   . fluticasone (FLONASE) 50 MCG/ACT nasal spray Place 2 sprays into both nostrils daily. 01/29/2017: As needed.   Marland Kitchen glucagon 1 MG injection Inject 1 mg into the vein once as needed. 01/20/2017: Available if needed.   . insulin glargine (LANTUS) 100 UNIT/ML injection Inject 22 Units into the skin at bedtime.  01/20/2017: Per [name](caregiver), Dr. Jonathon Jordan pharmacist increased to 22 units 6/25.   Marland Kitchen insulin lispro (HUMALOG) 100 UNIT/ML injection Inject 6-10 Units into the skin 3 (three) times daily before meals. take 10 units at breakfast, 6 units at lunch, 10 units at supper. 01/19/2017: With additional sliding scale 200-249 1 250-299 3 300-349 5 350-399 7 >400 9      . levothyroxine (SYNTHROID, LEVOTHROID) 100 MCG tablet TAKE 1 TABLET(100 MCG) BY MOUTH DAILY   . metFORMIN (GLUCOPHAGE) 500 MG tablet TAKE 1 TABLET(500 MG) BY MOUTH TWICE DAILY WITH A MEAL   . midodrine (PROAMATINE) 10 MG tablet Take 1 tablet (10 mg total) by mouth 3 (three) times daily with meals. (Patient not  taking: Reported on 01/29/2017) 01/29/2017: Per [name] pt ran out, MD called.   . pantoprazole (PROTONIX) 40 MG tablet Take 1 tablet (40 mg total) by mouth 2 (two) times daily.    No facility-administered encounter medications on file as of 01/29/2017.     Functional Status:  In your present state of health, do you have any difficulty performing the following activities: 01/13/2017 12/15/2016  Hearing? Tempie Donning  Vision? N N  Difficulty concentrating or making decisions? Tempie Donning  Walking or climbing stairs? Y N  Dressing or bathing? N N  Doing errands, shopping? Tempie Donning  Preparing Food and eating ? - Y  Using the Toilet? - N  In the past six months, have you accidently leaked urine? - Y  Do you have problems with loss of bowel control? - N  Managing your Medications? - Y  Managing your Finances? - Y  Housekeeping or managing your Housekeeping? - Y  Some recent data might be hidden    Fall/Depression Screening:  PHQ 2/9 Scores 12/15/2016 11/27/2016 08/15/2016 04/01/2016 01/04/2016 08/15/2014 07/26/2012  PHQ - 2 Score 1 3 0 0 0 0 0  PHQ- 9 Score - 8 - - - - -    Assessment:  Joint visit with RNCM who discussed the consequences of medication non-adherence. Reinforced the importance of taking her insulin as ordered. Patient agrees to take her medication to not upset her family, specifically her son in [place].  [name] present as well  as patient's son Laverna Peace. Patient's son Legrand Como active with patient's care, pays for [name] to provide care during the week,  however lives in [place]. It is not known if patient's son can higher someone to assist patient on the weekends, however will be contacted to discuss increasing care on the weekends if patient's medication adherence does not improve   Plan:  This Education officer, museum will follow up with patient within 1 month regarding medication adherence.    Sheralyn Boatman [facility] Care Management [phone]

## 2017-01-29 NOTE — Telephone Encounter (Signed)
We have never prescribed Dr Veleta Miners prescribed , before.    Last office visit 01/21/17  Next office visit 02/20/17

## 2017-01-29 NOTE — Patient Outreach (Signed)
[facility]) Care Management   01/29/2017  [name] [name] 05-May-1932 [id number]  [name] [name] is an 83 y.o. female  Subjective:  [name] [name] (great granddaughter) reports pt saw Dr. Lacinda [name] last week, called MD office today about refill on Midodrine and continuous Glucose monitor- not heard back yet.  [name] [name] reports pt's sugar this am was 425, at lunch was 134, 2 pm- 114.    Pt reports she had graham crackers and orange juice last night, reason sugar was elevated.   [name] [name] reports she stays with pt Monday- Friday during the day, dials up pt's Lantus insulin before she leaves for pt to take at 7 pm with son giving her reminders but finds that at times pt does not want to take it.  Pt reports sometimes she is rebellious, does not want to take her Lantus insulin.  [name] [name] reports other family members are suppose to come on the weekends, does not always happen, sugars run higher.  [name] [name] reports HH PT and RN still coming.     Objective:   Vitals:   01/29/17 1431  BP: (!) 106/54  Pulse: 98  Resp: 16    ROS  Physical Exam  Constitutional: She appears well-developed and well-nourished.  Cardiovascular: Normal rate and regular rhythm.   Respiratory: Effort normal and breath sounds normal.  GI: Soft. Bowel sounds are normal.  Musculoskeletal: She exhibits no edema.  Pt remained in chair during home visit.   Neurological: She is alert.  Skin: Skin is warm and dry.  Psychiatric: She has a normal mood and affect. Her behavior is normal.    Encounter Medications:   Outpatient Encounter Prescriptions as of 01/29/2017  Medication Sig Note  . aspirin EC 81 MG tablet Take 1 tablet (81 mg total) by mouth daily.   Marland Kitchen atorvastatin (LIPITOR) 80 MG tablet Take 1 tablet (80 mg total) by mouth daily.   . clopidogrel (PLAVIX) 75 MG tablet TAKE 1 TABLET(75 MG) BY MOUTH DAILY   . fluticasone (FLONASE) 50 MCG/ACT nasal spray Place 2 sprays into both nostrils daily. 01/29/2017: As needed.   . insulin  glargine (LANTUS) 100 UNIT/ML injection Inject 22 Units into the skin at bedtime.  01/20/2017: Per [name] [name](caregiver), Dr. Jonathon [name] pharmacist increased to 22 units 6/25.   Marland Kitchen insulin lispro (HUMALOG) 100 UNIT/ML injection Inject 6-10 Units into the skin 3 (three) times daily before meals. take 10 units at breakfast, 6 units at lunch, 10 units at supper. 01/19/2017: With additional sliding scale 200-249 1 250-299 3 300-349 5 350-399 7 >400 9      . levothyroxine (SYNTHROID, LEVOTHROID) 100 MCG tablet TAKE 1 TABLET(100 MCG) BY MOUTH DAILY   . metFORMIN (GLUCOPHAGE) 500 MG tablet TAKE 1 TABLET(500 MG) BY MOUTH TWICE DAILY WITH A MEAL   . pantoprazole (PROTONIX) 40 MG tablet Take 1 tablet (40 mg total) by mouth 2 (two) times daily.   . clopidogrel (PLAVIX) 75 MG tablet TAKE 1 TABLET(75 MG) BY MOUTH DAILY   . glucagon 1 MG injection Inject 1 mg into the vein once as needed. 01/20/2017: Available if needed.   . midodrine (PROAMATINE) 10 MG tablet Take 1 tablet (10 mg total) by mouth 3 (three) times daily with meals. (Patient not taking: Reported on 01/29/2017) 01/29/2017: Per [name] [name] pt ran out, MD called.    No facility-administered encounter medications on file as of 01/29/2017.     Functional Status:   In your present state of health, do you have any difficulty performing the  following activities: 01/13/2017 12/15/2016  Hearing? [name] [name]  Vision? N N  Difficulty concentrating or making decisions? [name] [name]  Walking or climbing stairs? Y N  Dressing or bathing? N N  Doing errands, shopping? [name] [name]  Preparing Food and eating ? - Y  Using the Toilet? - N  In the past six months, have you accidently leaked urine? - Y  Do you have problems with loss of bowel control? - N  Managing your Medications? - Y  Managing your Finances? - Y  Housekeeping or managing your Housekeeping? - Y  Some recent data might be hidden    Fall/Depression Screening:    Fall Risk  12/15/2016 08/15/2016 04/01/2016  Falls in the past year? No No No   Risk for fall due to : - - Impaired balance/gait;Impaired mobility   PHQ 2/9 Scores 12/15/2016 11/27/2016 08/15/2016 04/01/2016 01/04/2016 08/15/2014 07/26/2012  PHQ - 2 Score 1 3 0 0 0 0 0  PHQ- 9 Score - 8 - - - - -    Assessment:  Pleasant 62 year old woman, son lives with her.  [name] [name]   (great granddaughter) present during home visit- per [name] [name] paid by    [name] [name] (lives in [place]) to stay with pt Monday- Friday.      Lungs clear, no complains of pain, sob, chest pain.  No edema.  * DM:  Review of pt's sugars (recorded by [name] [name]), most recent in am           6/30- 403, 7/1- 600, yesterday 23.  Ranges at dinner- 121-434, pm      Ranges 55-358    *  BP today 106/54, pt reports no dizziness, no recent falls.   Per [name] [name],        Pt ran out of Midodrine yesterday, to call MD office again if needed       About refill.          Plan:  As discussed with pt and [name] [name]- will continue to follow  for             Transition of care, coworker [name] Osmond RN CM covering for this                RN CM will follow up next week telephonically.                  [facility] CM Care Plan Problem One     Most Recent Value  Care Plan Problem One  Risk for readmission related to recent hospitalization for dehydration, UTI, low BP   Role Documenting the Problem One  Care Management Coordinator  Care Plan for Problem One  Active  [facility] Long Term Goal   Pt would not readmit within the next 31 days   [facility] Long Term Goal Start Date  01/20/17  Interventions for Problem One Long Term Goal  Reviewed with pt adherence with diabetic diet, pm insulin.   [facility] CM Short Term Goal #1   Pt would take all medications as ordered for the next 30 days   [facility] CM Short Term Goal #1 Start Date  01/20/17  Interventions for Short Term Goal #1  utilized teach back with pt to take Lantus insulin as ordered, reminders provided by son and [name] [name]   [facility] CM Short Term Goal #2   Pt would keep all MD appointments in the next 30 days   [facility] CM Short Term Goal #2  Start Date  01/20/17  Interventions  for Short Term Goal #2  Discussed with [name] [name] pt's recent visit with Dr. Smiley Houseman.   [facility]  [phone]

## 2017-01-29 NOTE — Telephone Encounter (Signed)
Medication Refill requested for : Midodrine Pharmacy: [facility]  Return Contact :[phone] Patient was also requesting to know the status of having a continuous glucose machine , advised by Merleen Nicely.

## 2017-01-30 ENCOUNTER — Ambulatory Visit: Payer: Medicare Other | Admitting: *Deleted

## 2017-01-30 MED ORDER — MIDODRINE HCL 10 MG PO TABS: 10.0000 mg | ORAL_TABLET | Freq: Three times a day (TID) | ORAL | 1 refills | Status: AC

## 2017-02-02 ENCOUNTER — Encounter: Payer: Self-pay | Admitting: Emergency Medicine

## 2017-02-02 ENCOUNTER — Telehealth: Payer: Self-pay | Admitting: *Deleted

## 2017-02-02 ENCOUNTER — Emergency Department: Payer: Medicare Other

## 2017-02-02 ENCOUNTER — Emergency Department
Admission: EM | Admit: 2017-02-02 | Discharge: 2017-02-02 | Disposition: A | Discharge status: Home / Self Care | Payer: Medicare Other | Attending: Emergency Medicine | Admitting: Emergency Medicine

## 2017-02-02 DIAGNOSIS — E039 Hypothyroidism, unspecified: Secondary | ICD-10-CM | POA: Diagnosis not present

## 2017-02-02 DIAGNOSIS — Z853 Personal history of malignant neoplasm of breast: Secondary | ICD-10-CM | POA: Diagnosis not present

## 2017-02-02 DIAGNOSIS — E1151 Type 2 diabetes mellitus with diabetic peripheral angiopathy without gangrene: Secondary | ICD-10-CM | POA: Diagnosis not present

## 2017-02-02 DIAGNOSIS — M810 Age-related osteoporosis without current pathological fracture: Secondary | ICD-10-CM | POA: Diagnosis not present

## 2017-02-02 DIAGNOSIS — R531 Weakness: Secondary | ICD-10-CM | POA: Diagnosis not present

## 2017-02-02 DIAGNOSIS — Z794 Long term (current) use of insulin: Secondary | ICD-10-CM | POA: Insufficient documentation

## 2017-02-02 DIAGNOSIS — G4733 Obstructive sleep apnea (adult) (pediatric): Secondary | ICD-10-CM | POA: Diagnosis not present

## 2017-02-02 DIAGNOSIS — R5383 Other fatigue: Secondary | ICD-10-CM | POA: Diagnosis not present

## 2017-02-02 DIAGNOSIS — I252 Old myocardial infarction: Secondary | ICD-10-CM | POA: Insufficient documentation

## 2017-02-02 DIAGNOSIS — Z7902 Long term (current) use of antithrombotics/antiplatelets: Secondary | ICD-10-CM | POA: Insufficient documentation

## 2017-02-02 DIAGNOSIS — Z8673 Personal history of transient ischemic attack (TIA), and cerebral infarction without residual deficits: Secondary | ICD-10-CM | POA: Insufficient documentation

## 2017-02-02 DIAGNOSIS — I639 Cerebral infarction, unspecified: Secondary | ICD-10-CM | POA: Diagnosis not present

## 2017-02-02 DIAGNOSIS — K219 Gastro-esophageal reflux disease without esophagitis: Secondary | ICD-10-CM | POA: Diagnosis not present

## 2017-02-02 DIAGNOSIS — E1165 Type 2 diabetes mellitus with hyperglycemia: Secondary | ICD-10-CM | POA: Diagnosis not present

## 2017-02-02 DIAGNOSIS — I2510 Atherosclerotic heart disease of native coronary artery without angina pectoris: Secondary | ICD-10-CM | POA: Diagnosis not present

## 2017-02-02 DIAGNOSIS — E1122 Type 2 diabetes mellitus with diabetic chronic kidney disease: Secondary | ICD-10-CM | POA: Diagnosis not present

## 2017-02-02 DIAGNOSIS — J449 Chronic obstructive pulmonary disease, unspecified: Secondary | ICD-10-CM | POA: Diagnosis not present

## 2017-02-02 DIAGNOSIS — Z7982 Long term (current) use of aspirin: Secondary | ICD-10-CM | POA: Insufficient documentation

## 2017-02-02 DIAGNOSIS — E785 Hyperlipidemia, unspecified: Secondary | ICD-10-CM | POA: Diagnosis not present

## 2017-02-02 DIAGNOSIS — R110 Nausea: Secondary | ICD-10-CM | POA: Diagnosis not present

## 2017-02-02 DIAGNOSIS — N183 Chronic kidney disease, stage 3 (moderate): Secondary | ICD-10-CM | POA: Diagnosis not present

## 2017-02-02 DIAGNOSIS — D649 Anemia, unspecified: Secondary | ICD-10-CM | POA: Diagnosis not present

## 2017-02-02 DIAGNOSIS — Z7984 Long term (current) use of oral hypoglycemic drugs: Secondary | ICD-10-CM | POA: Diagnosis not present

## 2017-02-02 LAB — BASIC METABOLIC PANEL
ANION GAP: 15 (ref 5–15)
BUN: 23 mg/dL — ABNORMAL HIGH (ref 6–20)
CHLORIDE: 102 mmol/L (ref 101–111)
CO2: 21 mmol/L — AB (ref 22–32)
Calcium: 9.9 mg/dL (ref 8.9–10.3)
Creatinine, Ser: 1.12 mg/dL — ABNORMAL HIGH (ref 0.44–1.00)
GFR calc non Af Amer: 44 mL/min — ABNORMAL LOW (ref >60)
GFR, EST AFRICAN AMERICAN: 52 mL/min — AB (ref >60)
Glucose, Bld: 115 mg/dL — ABNORMAL HIGH (ref 65–99)
POTASSIUM: 3.5 mmol/L (ref 3.5–5.1)
Sodium: 138 mmol/L (ref 135–145)

## 2017-02-02 LAB — HEPATIC FUNCTION PANEL
ALBUMIN: 3.9 g/dL (ref 3.5–5.0)
ALK PHOS: 120 U/L (ref 38–126)
ALT: 28 U/L (ref 14–54)
AST: 50 U/L — AB (ref 15–41)
BILIRUBIN TOTAL: 0.8 mg/dL (ref 0.3–1.2)
Bilirubin, Direct: 0.2 mg/dL (ref 0.1–0.5)
Indirect Bilirubin: 0.6 mg/dL (ref 0.3–0.9)
Total Protein: 7.4 g/dL (ref 6.5–8.1)

## 2017-02-02 LAB — DIFFERENTIAL
Basophils Absolute: 0 10*3/uL (ref 0–0.1)
Basophils Relative: 1 %
EOS ABS: 0.2 10*3/uL (ref 0–0.7)
EOS PCT: 3 %
LYMPHS ABS: 0.9 10*3/uL — AB (ref 1.0–3.6)
LYMPHS PCT: 20 %
MONOS PCT: 7 %
Monocytes Absolute: 0.3 10*3/uL (ref 0.2–0.9)
Neutro Abs: 3.2 10*3/uL (ref 1.4–6.5)
Neutrophils Relative %: 69 %

## 2017-02-02 LAB — CBC
HEMATOCRIT: 31 % — AB (ref 35.0–47.0)
HEMOGLOBIN: 10.7 g/dL — AB (ref 12.0–16.0)
MCH: 33 pg (ref 26.0–34.0)
MCHC: 34.5 g/dL (ref 32.0–36.0)
MCV: 95.5 fL (ref 80.0–100.0)
Platelets: 235 10*3/uL (ref 150–440)
RBC: 3.24 MIL/uL — AB (ref 3.80–5.20)
RDW: 14.9 % — ABNORMAL HIGH (ref 11.5–14.5)
WBC: 4.7 10*3/uL (ref 3.6–11.0)

## 2017-02-02 LAB — URINALYSIS, COMPLETE (UACMP) WITH MICROSCOPIC
BACTERIA UA: NONE SEEN
BILIRUBIN URINE: NEGATIVE
Glucose, UA: >=500 mg/dL — AB
Hgb urine dipstick: NEGATIVE
KETONES UR: 20 mg/dL — AB
Leukocytes, UA: NEGATIVE
Nitrite: NEGATIVE
PROTEIN: NEGATIVE mg/dL
Specific Gravity, Urine: 1.02 (ref 1.005–1.030)
Squamous Epithelial / LPF: NONE SEEN
pH: 5 (ref 5.0–8.0)

## 2017-02-02 LAB — TROPONIN I: Troponin I: <0.03 ng/mL (ref <0.03)

## 2017-02-02 LAB — TSH: TSH: 0.21 u[IU]/mL — ABNORMAL LOW (ref 0.350–4.500)

## 2017-02-02 LAB — T4, FREE: Free T4: 1.68 ng/dL — ABNORMAL HIGH (ref 0.61–1.12)

## 2017-02-02 NOTE — Discharge Instructions (Signed)
Fortunately today your blood work and CT scan were all reassuring. Please make an appointment to follow-up with your primary care physician later on this week for recheck. Return to the emergency department sooner for any concerns.  It was a pleasure to take care of you today, and thank you for coming to our emergency department.  If you have any questions or concerns before leaving please ask the nurse to grab me and I'm more than happy to go through your aftercare instructions again.  If you were prescribed any opioid pain medication today such as Norco, Vicodin, Percocet, morphine, hydrocodone, or oxycodone please make sure you do not drive when you are taking this medication as it can alter your ability to drive safely.  If you have any concerns once you are home that you are not improving or are in fact getting worse before you can make it to your follow-up appointment, please do not hesitate to call 911 and come back for further evaluation.  Darel Hong MD  Results for orders placed or performed during the hospital encounter of 60/45/40  Basic metabolic panel  Result Value Ref Range   Sodium 138 135 - 145 mmol/L   Potassium 3.5 3.5 - 5.1 mmol/L   Chloride 102 101 - 111 mmol/L   CO2 21 (L) 22 - 32 mmol/L   Glucose, Bld 115 (H) 65 - 99 mg/dL   BUN 23 (H) 6 - 20 mg/dL   Creatinine, Ser 1.12 (H) 0.44 - 1.00 mg/dL   Calcium 9.9 8.9 - 10.3 mg/dL   GFR calc non Af Amer 44 (L) >60 mL/min   GFR calc Af Amer 52 (L) >60 mL/min   Anion gap 15 5 - 15  Hepatic function panel  Result Value Ref Range   Total Protein 7.4 6.5 - 8.1 g/dL   Albumin 3.9 3.5 - 5.0 g/dL   AST 50 (H) 15 - 41 U/L   ALT 28 14 - 54 U/L   Alkaline Phosphatase 120 38 - 126 U/L   Total Bilirubin 0.8 0.3 - 1.2 mg/dL   Bilirubin, Direct 0.2 0.1 - 0.5 mg/dL   Indirect Bilirubin 0.6 0.3 - 0.9 mg/dL  Troponin I  Result Value Ref Range   Troponin I <0.03 <0.03 ng/mL  TSH  Result Value Ref Range   TSH 0.210 (L) 0.350 -  4.500 uIU/mL  CBC  Result Value Ref Range   WBC 4.7 3.6 - 11.0 K/uL   RBC 3.24 (L) 3.80 - 5.20 MIL/uL   Hemoglobin 10.7 (L) 12.0 - 16.0 g/dL   HCT 31.0 (L) 35.0 - 47.0 %   MCV 95.5 80.0 - 100.0 fL   MCH 33.0 26.0 - 34.0 pg   MCHC 34.5 32.0 - 36.0 g/dL   RDW 14.9 (H) 11.5 - 14.5 %   Platelets 235 150 - 440 K/uL  Differential  Result Value Ref Range   Neutrophils Relative % 69 %   Neutro Abs 3.2 1.4 - 6.5 K/uL   Lymphocytes Relative 20 %   Lymphs Abs 0.9 (L) 1.0 - 3.6 K/uL   Monocytes Relative 7 %   Monocytes Absolute 0.3 0.2 - 0.9 K/uL   Eosinophils Relative 3 %   Eosinophils Absolute 0.2 0 - 0.7 K/uL   Basophils Relative 1 %   Basophils Absolute 0.0 0 - 0.1 K/uL  T4, free  Result Value Ref Range   Free T4 1.68 (H) 0.61 - 1.12 ng/dL  Urinalysis, Complete w Microscopic  Result Value Ref  Range   Color, Urine YELLOW (A) YELLOW   APPearance CLEAR (A) CLEAR   Specific Gravity, Urine 1.020 1.005 - 1.030   pH 5.0 5.0 - 8.0   Glucose, UA >=500 (A) NEGATIVE mg/dL   Hgb urine dipstick NEGATIVE NEGATIVE   Bilirubin Urine NEGATIVE NEGATIVE   Ketones, ur 20 (A) NEGATIVE mg/dL   Protein, ur NEGATIVE NEGATIVE mg/dL   Nitrite NEGATIVE NEGATIVE   Leukocytes, UA NEGATIVE NEGATIVE   RBC / HPF 0-5 0 - 5 RBC/hpf   WBC, UA 0-5 0 - 5 WBC/hpf   Bacteria, UA NONE SEEN NONE SEEN   Squamous Epithelial / LPF NONE SEEN NONE SEEN   Mucous PRESENT    Hyaline Casts, UA PRESENT    Dg Chest 1 View  Result Date: 02/02/2017 CLINICAL DATA:  Weakness. EXAM: CHEST 1 VIEW COMPARISON:  Radiographs of Dec 13, 2016. FINDINGS: The heart size and mediastinal contours are within normal limits. No pneumothorax or pleural effusion is noted. Atherosclerosis of thoracic aorta is noted. No acute pulmonary disease is noted. The visualized skeletal structures are unremarkable. IMPRESSION: No acute cardiopulmonary abnormality seen.  Aortic atherosclerosis. Electronically Signed   By: Marijo Conception, M.D.   On: 02/02/2017  15:32   Dg Chest 2 View  Result Date: 01/13/2017 CLINICAL DATA:  History of right breast cancer with vomiting. EXAM: CHEST  2 VIEW COMPARISON:  11/11/2016 FINDINGS: Lungs are clear without airspace disease or pulmonary edema. No large pleural effusions. Thoracic aorta is heavily calcified. There is some scarring at the lung apices. Heart size is normal. No acute bone abnormality. IMPRESSION: No active cardiopulmonary disease. Electronically Signed   By: Markus Daft M.D.   On: 01/13/2017 16:25   Ct Head Wo Contrast  Result Date: 02/02/2017 CLINICAL DATA:  83 year old diabetic female not feeling well since yesterday. Personal history of breast cancer and stroke. Initial encounter. EXAM: CT HEAD WITHOUT CONTRAST TECHNIQUE: Contiguous axial images were obtained from the base of the skull through the vertex without intravenous contrast. COMPARISON:  11/07/2016. FINDINGS: Brain: No intracranial hemorrhage or CT evidence of large acute infarct. Remote small left cerebellar infarct. Marked confluent periventricular and subcortical white matter changes consistent with result of chronic microvascular disease. No intracranial mass lesion noted on this unenhanced exam. Global atrophy without hydrocephalus. Vascular: Vascular calcifications. Skull: No acute abnormality. Sinuses/Orbits: Post lens replacement. No acute orbital abnormality. Visualized paranasal sinuses are clear. Other: Mastoid air cells and middle ear cavities are clear. IMPRESSION: No intracranial hemorrhage or CT evidence of large acute infarct. Remote small left cerebellar infarct. Prominent chronic microvascular changes. Atrophy. Electronically Signed   By: Genia Del M.D.   On: 02/02/2017 15:46

## 2017-02-02 NOTE — ED Provider Notes (Signed)
[facility] Emergency Department Provider Note  ____________________________________________   First MD Initiated Contact with Patient 02/02/17 1511     (approximate)  I have reviewed the triage vital signs and the nursing notes.   HISTORY  Chief Complaint Weakness   HPI [name] [name] is a 83 y.o. female who comes to the emergency department via EMS for generalized fatigue. Her granddaughter called 28 today even though the patient did not want to come to the hospital. When EMS arrived they had to convince her to come. She has a past medical history of COPD, coronary artery disease,transient ischemic attack, diabetes mellitus, and recently had a urinary tract infection for which she recently completed a course of antibiotics. This morning her blood sugar was in the 600s but she took insulin and en route her sugar was 1:30. She has no pain whatsoever at this time. She denies headache double vision blurred vision chest pain or shortness of breath. She said that yesterday she felt completely normal but after waking up today she felt somewhat off.   Past Medical History:  Diagnosis Date  . Anxiety state, unspecified   . CAD (coronary artery disease)   . Cancer [facility])    2004 Right breast, found on mammogram, radiation therapy, Dr. Bryson Ha, uterine cancer,   . COPD (chronic obstructive pulmonary disease) ([place])   . Diabetes mellitus   . Esophageal reflux   . Heart burn   . Hyperlipidemia   . IBS (irritable bowel syndrome)   . Myocardial infarction [facility])    Cath negative except for 40% occlusion LAD.  Pt not candidate for betablocker or ACEI because of hypotension  . OSA (obstructive sleep apnea)   . Osteoporosis 02/21/09   DEXA scan showed osteoporosis with left femur T-score -2.8.  Marland Kitchen Presbyacusis   . Stroke ([place])   . Thyroid disease    Hypothyroid  . Vitamin D deficiency     Patient Active Problem List   Diagnosis Date Noted  . AKI (acute kidney  injury) ([place]) 01/13/2017  . Dementia, vascular 12/03/2016  . Stroke ([place]) 08/20/2016  . Peripheral vascular disease ([place]) 04/09/2016  . CKD stage 3 due to type 2 diabetes mellitus ([place]) 02/22/2014  . Hypothyroidism 09/23/2012  . GERD (gastroesophageal reflux disease) 09/23/2012  . Anxiety 07/26/2012  . Hyperlipidemia with target LDL less than 70 12/03/2011  . DM (diabetes mellitus) type II uncontrolled, periph vascular disorder ([place]) 04/03/2011  . CAD (coronary artery disease) 12/13/2009    Past Surgical History:  Procedure Laterality Date  . ABDOMINAL HYSTERECTOMY     uterine cancer  . BREAST SURGERY    . COLON SURGERY  2013   done at [facility]    Prior to Admission medications   Medication Sig Start Date End Date Taking? Authorizing Provider  aspirin EC 81 MG tablet Take 1 tablet (81 mg total) by mouth daily. 04/09/16   [name], DO  atorvastatin (LIPITOR) 80 MG tablet Take 1 tablet (80 mg total) by mouth daily. 08/21/16   [name], DO  clopidogrel (PLAVIX) 75 MG tablet TAKE 1 TABLET(75 MG) BY MOUTH DAILY 01/01/17   Thersa Salt G, DO  clopidogrel (PLAVIX) 75 MG tablet TAKE 1 TABLET(75 MG) BY MOUTH DAILY 01/22/17   Cook, Jayce G, DO  fluticasone (FLONASE) 50 MCG/ACT nasal spray Place 2 sprays into both nostrils daily. 01/01/17   [name], DO  glucagon 1 MG injection Inject 1 mg into the vein once as needed.  [provider]  insulin glargine (LANTUS) 100 UNIT/ML injection Inject 22 Units into the skin at bedtime.     [provider]  insulin lispro (HUMALOG) 100 UNIT/ML injection Inject 6-10 Units into the skin 3 (three) times daily before meals. take 10 units at breakfast, 6 units at lunch, 10 units at supper.    [provider]  levothyroxine (SYNTHROID, LEVOTHROID) 100 MCG tablet TAKE 1 TABLET(100 MCG) BY MOUTH DAILY 01/01/17   Thersa Salt G, DO  metFORMIN (GLUCOPHAGE) 500 MG tablet TAKE 1 TABLET(500 MG) BY MOUTH TWICE DAILY WITH A MEAL 10/20/16    Cook, Jayce G, DO  midodrine (PROAMATINE) 10 MG tablet Take 1 tablet (10 mg total) by mouth 3 (three) times daily with meals. 01/30/17   [name], DO  pantoprazole (PROTONIX) 40 MG tablet Take 1 tablet (40 mg total) by mouth 2 (two) times daily. 01/04/16   Jackolyn Confer, MD    Allergies Penicillins  Family History  Problem Relation Age of Onset  . Diabetes Mother   . Heart attack Father     Social History Social History  Substance Use Topics  . Smoking status: Never Smoker  . Smokeless tobacco: Never Used  . Alcohol use No    Review of Systems Constitutional: No fever/chills Eyes: No visual changes. ENT: No sore throat. Cardiovascular: Denies chest pain. Respiratory: Denies shortness of breath. Gastrointestinal: No abdominal pain.  No nausea, no vomiting.  No diarrhea.  No constipation. Genitourinary: Negative for dysuria. Musculoskeletal: Negative for back pain. Skin: Negative for rash. Neurological: Negative for headaches, focal weakness or numbness.   ____________________________________________   PHYSICAL EXAM:  VITAL SIGNS: ED Triage Vitals [02/02/17 1508]  Enc Vitals Group     BP      Pulse      Resp      Temp      Temp src      SpO2      Weight      Height      Head Circumference      Peak Flow      Pain Score 0     Pain Loc      Pain Edu?      Excl. in [place]?     Constitutional: The patient is clearly somewhat confused alert and oriented to her name location and purpose but does not know the year although she does know the president in no acute distress Eyes: PERRL EOMI. Head: Atraumatic. Nose: No congestion/rhinnorhea. Mouth/Throat: No trismus Neck: No stridor.   Cardiovascular: Normal rate, regular rhythm. Grossly normal heart sounds.  Good peripheral circulation. Respiratory: Normal respiratory effort.  No retractions. Lungs CTAB and moving good air Gastrointestinal: Soft nontender Musculoskeletal: No lower extremity edema     Neurologic:  No gross focal neurologic deficits are appreciated. No pronator drift cranial nerves II through XII intact Skin:  Skin is warm, dry and intact. No rash noted.     ____________________________________________   DIFFERENTIAL includes but not limited to  Metabolic drainage, urinary tract infection, acute coronary syndrome, stroke, TIA ____________________________________________   LABS (all labs ordered are listed, but only abnormal results are displayed)  Labs Reviewed  BASIC METABOLIC PANEL - Abnormal; Notable for the following:       Result Value   CO2 21 (*)    Glucose, Bld 115 (*)    BUN 23 (*)    Creatinine, Ser 1.12 (*)    GFR calc non Af Amer 44 (*)  GFR calc Af Amer 52 (*)    All other components within normal limits  HEPATIC FUNCTION PANEL - Abnormal; Notable for the following:    AST 50 (*)    All other components within normal limits  TSH - Abnormal; Notable for the following:    TSH 0.210 (*)    All other components within normal limits  CBC - Abnormal; Notable for the following:    RBC 3.24 (*)    Hemoglobin 10.7 (*)    HCT 31.0 (*)    RDW 14.9 (*)    All other components within normal limits  DIFFERENTIAL - Abnormal; Notable for the following:    Lymphs Abs 0.9 (*)    All other components within normal limits  T4, FREE - Abnormal; Notable for the following:    Free T4 1.68 (*)    All other components within normal limits  URINALYSIS, COMPLETE (UACMP) WITH MICROSCOPIC - Abnormal; Notable for the following:    Color, Urine YELLOW (*)    APPearance CLEAR (*)    Glucose, UA >=500 (*)    Ketones, ur 20 (*)    All other components within normal limits  URINE CULTURE  TROPONIN I  CBC WITH DIFFERENTIAL/PLATELET    No signs of urinary tract infection labs unremarkable no signs of acute ischemia  EKG  ED ECG REPORT I, Darel Hong, the attending physician, personally viewed and interpreted this ECG.  Date: 02/02/2017 EKG Time:   Rate: 88 Rhythm: normal sinus rhythm QRS Axis: normal Intervals: normal ST/T Wave abnormalities: normal Narrative Interpretation: unremarkable ___________________________________  RADIOLOGY  Chest x-ray with no acute disease and CT scan of the head with no acute disease ____________________________________________   PROCEDURES  Procedure(s) performed: no  Procedures  Critical Care performed: no  Observation: no ____________________________________________   INITIAL IMPRESSION / ASSESSMENT AND PLAN / ED COURSE  Pertinent labs & imaging results that were available during my care of the patient were reviewed by me and considered in my medical decision making (see chart for details).  The patient arrives well-appearing hemodynamically stable with an unremarkable exam. She does report general sense of "not feeling right". At her age differentials extremely broad and includes acute coronary syndrome stroke and other metabolic derangements. Broad workup is pending. She declines pain medication at this time.  Fortunately her blood work urinalysis and advanced imaging is all negative for acute pathology. She feels significantly improved and would like to go home which I think is reasonable. Strict return precautions given and she'll be discharged home with primary care follow-up.      ____________________________________________   FINAL CLINICAL IMPRESSION(S) / ED DIAGNOSES  Final diagnoses:  Fatigue, unspecified type      NEW MEDICATIONS STARTED DURING THIS VISIT:  Discharge Medication List as of 02/02/2017  6:28 PM       Note:  This document was prepared using Dragon voice recognition software and may include unintentional dictation errors.     Darel Hong, MD 02/03/17 1409

## 2017-02-02 NOTE — Telephone Encounter (Signed)
Medication Refill requested for : midodrine  Pharmacy: [facility]  Return Contact : [phone]  [facility] is requesting a update on  internal glucose monitor.  Please contact [place]

## 2017-02-02 NOTE — Telephone Encounter (Signed)
Already sent in Rx

## 2017-02-02 NOTE — ED Notes (Signed)
Spoke with son on phone. He is to arrange discharge transportation . He said he will find someone to get her shortly

## 2017-02-02 NOTE — ED Triage Notes (Signed)
Pt presents to ED from home r/t "not feeling good" since yesterday.

## 2017-02-03 ENCOUNTER — Encounter: Payer: Self-pay | Admitting: *Deleted

## 2017-02-03 ENCOUNTER — Other Ambulatory Visit: Payer: Self-pay | Admitting: *Deleted

## 2017-02-03 NOTE — Patient Outreach (Signed)
[facility]) Care Management [place] Telephone Outreach, Transition of Care day 15  02/03/2017  [name] [name] April 05, 1932 [id number]  Successful telephone outreach to [name] [name], caregiver/ granddaughter, on [facility] CM written consent, for [place], 83 y/o female followed by [facility] for transition of care after recent hospitalization June 19-22, 2018 for dehydration, dizziness, cystitis.  Patient has history including, but not limited to, CAD, DM, PVD, GERD, CVA, CKD, HLD, and dementia.  HIPAA/ identity verified by [name] [name] today.  I explained that I was contacting patient for primary [facility] RN CM Rose Pierzchala.  Noted that patient had ED visit yesterday, 02/02/17 for general malaise/ faitgue, and was discharged home without changes in her overall plan of care.  Today, [name] [name] reports that patient "is doing okay" after ED visit yesterday; reports that patient seemed confused yesterday after going into bathroom, and "just didn't seem like her normal self."  [name] [name] reported that she thought patient may have been experiencing another stroke or having symptoms from another UTI.  Reports CT and UA completed in ED, both of which were normal.  -- Reports patient seems "better today," "like her normal self," although [name] [name] states, "she didn't even remember going to the ED yesterday." -- Patient eating/ drinking "fine." -- Blood sugar this morning "170," and [name] [name] reports, "she is even taking her medicine like she is supposed to today." -- Has/ is taking medications as prescribed; reports no changes to medications. -- Confirms that patient is to see PCP Thursday 02/05/17, as per ED notes from yesterday; states that she is not sure what time appointment is scheduled; advised [name] [name] to contact PCP for clarification of appointment time, and to inform/ make sure PCP office staff are aware that appointment is a result of ED visit on 02/02/17.  [name] [name] denies further issues, concerns, or  problems around patient's status today.  I confirmed that patient has my direct phone number, the main [facility] CM office phone number, and the [facility] CM 24-hour nurse advice phone number should issues arise later this week or prior to next scheduled [place] outreach.  Explained that Rose would contact [name] [name] again next week for ongoing transition of care unless [name] [name] called me back this week with questions/ problems; [name] [name] verbalized understanding and agreement with this plan.  Plan:  Will update patient's primary [facility] RN CM of today's successful telephone outreach to patient  [name] Rack, RN, BSN, Long Coordinator [facility] Care Management  [phone]

## 2017-02-04 LAB — URINE CULTURE: Culture: NO GROWTH

## 2017-02-05 DIAGNOSIS — D649 Anemia, unspecified: Secondary | ICD-10-CM | POA: Diagnosis not present

## 2017-02-05 DIAGNOSIS — I2510 Atherosclerotic heart disease of native coronary artery without angina pectoris: Secondary | ICD-10-CM | POA: Diagnosis not present

## 2017-02-05 DIAGNOSIS — J449 Chronic obstructive pulmonary disease, unspecified: Secondary | ICD-10-CM | POA: Diagnosis not present

## 2017-02-05 DIAGNOSIS — E785 Hyperlipidemia, unspecified: Secondary | ICD-10-CM | POA: Diagnosis not present

## 2017-02-05 DIAGNOSIS — K219 Gastro-esophageal reflux disease without esophagitis: Secondary | ICD-10-CM | POA: Diagnosis not present

## 2017-02-05 DIAGNOSIS — R531 Weakness: Secondary | ICD-10-CM | POA: Diagnosis not present

## 2017-02-05 DIAGNOSIS — M810 Age-related osteoporosis without current pathological fracture: Secondary | ICD-10-CM | POA: Diagnosis not present

## 2017-02-05 DIAGNOSIS — G4733 Obstructive sleep apnea (adult) (pediatric): Secondary | ICD-10-CM | POA: Diagnosis not present

## 2017-02-05 DIAGNOSIS — E1151 Type 2 diabetes mellitus with diabetic peripheral angiopathy without gangrene: Secondary | ICD-10-CM | POA: Diagnosis not present

## 2017-02-05 DIAGNOSIS — E039 Hypothyroidism, unspecified: Secondary | ICD-10-CM | POA: Diagnosis not present

## 2017-02-05 DIAGNOSIS — E1165 Type 2 diabetes mellitus with hyperglycemia: Secondary | ICD-10-CM | POA: Diagnosis not present

## 2017-02-05 DIAGNOSIS — Z8673 Personal history of transient ischemic attack (TIA), and cerebral infarction without residual deficits: Secondary | ICD-10-CM | POA: Diagnosis not present

## 2017-02-05 DIAGNOSIS — Z794 Long term (current) use of insulin: Secondary | ICD-10-CM | POA: Diagnosis not present

## 2017-02-09 ENCOUNTER — Ambulatory Visit: Payer: Medicare Other | Admitting: Family Medicine

## 2017-02-09 ENCOUNTER — Telehealth: Payer: Self-pay | Admitting: Family Medicine

## 2017-02-09 DIAGNOSIS — E1151 Type 2 diabetes mellitus with diabetic peripheral angiopathy without gangrene: Secondary | ICD-10-CM | POA: Diagnosis not present

## 2017-02-09 DIAGNOSIS — I2510 Atherosclerotic heart disease of native coronary artery without angina pectoris: Secondary | ICD-10-CM | POA: Diagnosis not present

## 2017-02-09 DIAGNOSIS — R531 Weakness: Secondary | ICD-10-CM | POA: Diagnosis not present

## 2017-02-09 DIAGNOSIS — Z0289 Encounter for other administrative examinations: Secondary | ICD-10-CM

## 2017-02-09 DIAGNOSIS — D649 Anemia, unspecified: Secondary | ICD-10-CM | POA: Diagnosis not present

## 2017-02-09 DIAGNOSIS — M810 Age-related osteoporosis without current pathological fracture: Secondary | ICD-10-CM | POA: Diagnosis not present

## 2017-02-09 DIAGNOSIS — Z8673 Personal history of transient ischemic attack (TIA), and cerebral infarction without residual deficits: Secondary | ICD-10-CM | POA: Diagnosis not present

## 2017-02-09 DIAGNOSIS — E039 Hypothyroidism, unspecified: Secondary | ICD-10-CM | POA: Diagnosis not present

## 2017-02-09 DIAGNOSIS — J449 Chronic obstructive pulmonary disease, unspecified: Secondary | ICD-10-CM | POA: Diagnosis not present

## 2017-02-09 DIAGNOSIS — E1165 Type 2 diabetes mellitus with hyperglycemia: Secondary | ICD-10-CM | POA: Diagnosis not present

## 2017-02-09 DIAGNOSIS — G4733 Obstructive sleep apnea (adult) (pediatric): Secondary | ICD-10-CM | POA: Diagnosis not present

## 2017-02-09 DIAGNOSIS — Z794 Long term (current) use of insulin: Secondary | ICD-10-CM | POA: Diagnosis not present

## 2017-02-09 DIAGNOSIS — K219 Gastro-esophageal reflux disease without esophagitis: Secondary | ICD-10-CM | POA: Diagnosis not present

## 2017-02-09 DIAGNOSIS — E785 Hyperlipidemia, unspecified: Secondary | ICD-10-CM | POA: Diagnosis not present

## 2017-02-09 NOTE — Telephone Encounter (Signed)
fyi

## 2017-02-09 NOTE — Telephone Encounter (Signed)
FYI - Pt called and cancelled appt for this afternoon, had something come up.

## 2017-02-10 ENCOUNTER — Telehealth: Payer: Self-pay | Admitting: Family Medicine

## 2017-02-10 NOTE — Telephone Encounter (Signed)
Orders given to Jonah Blue  As listed below.

## 2017-02-10 NOTE — Telephone Encounter (Signed)
[name] [name] from [place] called and is looking for add on orders for Physical therapy. Nurse has notice some increased weakness and problems with ambulation. Please advise, thank you!  Call [phone]

## 2017-02-10 NOTE — Telephone Encounter (Signed)
Okay for verbal 

## 2017-02-11 ENCOUNTER — Other Ambulatory Visit: Payer: Self-pay | Admitting: *Deleted

## 2017-02-11 NOTE — Patient Outreach (Signed)
Unsuccessful telephone encounter to pt's great granddaughter [name] [name] (preferred contact,on consent form), attempt made as part of pt's  ongoing transition of care - recent hospitalization June 19-22, 2018 for dehydration, dizziness,acute cystitis without hematuria, Acute kidney injury.  HIPAA compliant voice message left with contact name and number.  Plan:  If no response to voice message left, plan to follow up again within next 2 days.    [name]   [place] Care Management  [phone]

## 2017-02-12 ENCOUNTER — Emergency Department: Payer: Medicare Other

## 2017-02-12 ENCOUNTER — Inpatient Hospital Stay
Admission: EM | Admit: 2017-02-12 | Discharge: 2017-02-16 | DRG: 638 | Disposition: A | Discharge status: Skilled Nursing Facility | Payer: Medicare Other | Attending: Internal Medicine | Admitting: Internal Medicine

## 2017-02-12 ENCOUNTER — Ambulatory Visit: Payer: Self-pay | Admitting: *Deleted

## 2017-02-12 ENCOUNTER — Encounter: Payer: Self-pay | Admitting: Emergency Medicine

## 2017-02-12 DIAGNOSIS — R9431 Abnormal electrocardiogram [ECG] [EKG]: Secondary | ICD-10-CM | POA: Diagnosis not present

## 2017-02-12 DIAGNOSIS — E1110 Type 2 diabetes mellitus with ketoacidosis without coma: Secondary | ICD-10-CM | POA: Diagnosis present

## 2017-02-12 DIAGNOSIS — E1022 Type 1 diabetes mellitus with diabetic chronic kidney disease: Secondary | ICD-10-CM | POA: Diagnosis present

## 2017-02-12 DIAGNOSIS — R6889 Other general symptoms and signs: Secondary | ICD-10-CM | POA: Diagnosis not present

## 2017-02-12 DIAGNOSIS — Z8673 Personal history of transient ischemic attack (TIA), and cerebral infarction without residual deficits: Secondary | ICD-10-CM | POA: Diagnosis not present

## 2017-02-12 DIAGNOSIS — Z7401 Bed confinement status: Secondary | ICD-10-CM | POA: Diagnosis not present

## 2017-02-12 DIAGNOSIS — Z8542 Personal history of malignant neoplasm of other parts of uterus: Secondary | ICD-10-CM | POA: Diagnosis not present

## 2017-02-12 DIAGNOSIS — I252 Old myocardial infarction: Secondary | ICD-10-CM | POA: Diagnosis not present

## 2017-02-12 DIAGNOSIS — R112 Nausea with vomiting, unspecified: Secondary | ICD-10-CM | POA: Diagnosis not present

## 2017-02-12 DIAGNOSIS — Z853 Personal history of malignant neoplasm of breast: Secondary | ICD-10-CM

## 2017-02-12 DIAGNOSIS — E1310 Other specified diabetes mellitus with ketoacidosis without coma: Secondary | ICD-10-CM | POA: Diagnosis not present

## 2017-02-12 DIAGNOSIS — Z7982 Long term (current) use of aspirin: Secondary | ICD-10-CM | POA: Diagnosis not present

## 2017-02-12 DIAGNOSIS — R4789 Other speech disturbances: Secondary | ICD-10-CM | POA: Diagnosis not present

## 2017-02-12 DIAGNOSIS — IMO0002 Reserved for concepts with insufficient information to code with codable children: Secondary | ICD-10-CM

## 2017-02-12 DIAGNOSIS — N179 Acute kidney failure, unspecified: Secondary | ICD-10-CM | POA: Diagnosis present

## 2017-02-12 DIAGNOSIS — R1111 Vomiting without nausea: Secondary | ICD-10-CM | POA: Diagnosis not present

## 2017-02-12 DIAGNOSIS — E1010 Type 1 diabetes mellitus with ketoacidosis without coma: Secondary | ICD-10-CM | POA: Diagnosis not present

## 2017-02-12 DIAGNOSIS — F4323 Adjustment disorder with mixed anxiety and depressed mood: Secondary | ICD-10-CM

## 2017-02-12 DIAGNOSIS — I2510 Atherosclerotic heart disease of native coronary artery without angina pectoris: Secondary | ICD-10-CM | POA: Diagnosis present

## 2017-02-12 DIAGNOSIS — Z79899 Other long term (current) drug therapy: Secondary | ICD-10-CM | POA: Diagnosis not present

## 2017-02-12 DIAGNOSIS — E785 Hyperlipidemia, unspecified: Secondary | ICD-10-CM | POA: Diagnosis present

## 2017-02-12 DIAGNOSIS — F329 Major depressive disorder, single episode, unspecified: Secondary | ICD-10-CM | POA: Diagnosis present

## 2017-02-12 DIAGNOSIS — E039 Hypothyroidism, unspecified: Secondary | ICD-10-CM | POA: Diagnosis not present

## 2017-02-12 DIAGNOSIS — M810 Age-related osteoporosis without current pathological fracture: Secondary | ICD-10-CM | POA: Diagnosis present

## 2017-02-12 DIAGNOSIS — R488 Other symbolic dysfunctions: Secondary | ICD-10-CM | POA: Diagnosis not present

## 2017-02-12 DIAGNOSIS — Z7902 Long term (current) use of antithrombotics/antiplatelets: Secondary | ICD-10-CM | POA: Diagnosis not present

## 2017-02-12 DIAGNOSIS — E0590 Thyrotoxicosis, unspecified without thyrotoxic crisis or storm: Secondary | ICD-10-CM | POA: Diagnosis present

## 2017-02-12 DIAGNOSIS — Z923 Personal history of irradiation: Secondary | ICD-10-CM | POA: Diagnosis not present

## 2017-02-12 DIAGNOSIS — K920 Hematemesis: Secondary | ICD-10-CM | POA: Diagnosis not present

## 2017-02-12 DIAGNOSIS — K219 Gastro-esophageal reflux disease without esophagitis: Secondary | ICD-10-CM | POA: Diagnosis present

## 2017-02-12 DIAGNOSIS — R531 Weakness: Secondary | ICD-10-CM | POA: Diagnosis not present

## 2017-02-12 DIAGNOSIS — R4182 Altered mental status, unspecified: Secondary | ICD-10-CM

## 2017-02-12 DIAGNOSIS — N189 Chronic kidney disease, unspecified: Secondary | ICD-10-CM

## 2017-02-12 DIAGNOSIS — F411 Generalized anxiety disorder: Secondary | ICD-10-CM | POA: Diagnosis present

## 2017-02-12 DIAGNOSIS — Z880 Allergy status to penicillin: Secondary | ICD-10-CM

## 2017-02-12 DIAGNOSIS — G4733 Obstructive sleep apnea (adult) (pediatric): Secondary | ICD-10-CM | POA: Diagnosis not present

## 2017-02-12 DIAGNOSIS — E1165 Type 2 diabetes mellitus with hyperglycemia: Secondary | ICD-10-CM

## 2017-02-12 DIAGNOSIS — E860 Dehydration: Secondary | ICD-10-CM | POA: Diagnosis present

## 2017-02-12 DIAGNOSIS — J449 Chronic obstructive pulmonary disease, unspecified: Secondary | ICD-10-CM | POA: Diagnosis not present

## 2017-02-12 DIAGNOSIS — N183 Chronic kidney disease, stage 3 (moderate): Secondary | ICD-10-CM | POA: Diagnosis present

## 2017-02-12 DIAGNOSIS — R2681 Unsteadiness on feet: Secondary | ICD-10-CM | POA: Diagnosis not present

## 2017-02-12 DIAGNOSIS — D649 Anemia, unspecified: Secondary | ICD-10-CM | POA: Diagnosis present

## 2017-02-12 DIAGNOSIS — Z90710 Acquired absence of both cervix and uterus: Secondary | ICD-10-CM | POA: Diagnosis not present

## 2017-02-12 DIAGNOSIS — E1151 Type 2 diabetes mellitus with diabetic peripheral angiopathy without gangrene: Secondary | ICD-10-CM

## 2017-02-12 DIAGNOSIS — Z9181 History of falling: Secondary | ICD-10-CM | POA: Diagnosis not present

## 2017-02-12 DIAGNOSIS — G3184 Mild cognitive impairment, so stated: Secondary | ICD-10-CM | POA: Diagnosis not present

## 2017-02-12 DIAGNOSIS — M6281 Muscle weakness (generalized): Secondary | ICD-10-CM | POA: Diagnosis not present

## 2017-02-12 LAB — COMPREHENSIVE METABOLIC PANEL
ALBUMIN: 3.8 g/dL (ref 3.5–5.0)
ALT: 31 U/L (ref 14–54)
AST: 46 U/L — AB (ref 15–41)
Alkaline Phosphatase: 147 U/L — ABNORMAL HIGH (ref 38–126)
Anion gap: 17 — ABNORMAL HIGH (ref 5–15)
BUN: 25 mg/dL — AB (ref 6–20)
CHLORIDE: 100 mmol/L — AB (ref 101–111)
CO2: 18 mmol/L — AB (ref 22–32)
CREATININE: 1.38 mg/dL — AB (ref 0.44–1.00)
Calcium: 9.4 mg/dL (ref 8.9–10.3)
GFR calc Af Amer: 40 mL/min — ABNORMAL LOW (ref >60)
GFR, EST NON AFRICAN AMERICAN: 35 mL/min — AB (ref >60)
GLUCOSE: 463 mg/dL — AB (ref 65–99)
POTASSIUM: 4.9 mmol/L (ref 3.5–5.1)
SODIUM: 135 mmol/L (ref 135–145)
Total Bilirubin: 1.7 mg/dL — ABNORMAL HIGH (ref 0.3–1.2)
Total Protein: 7.5 g/dL (ref 6.5–8.1)

## 2017-02-12 LAB — CBC WITH DIFFERENTIAL/PLATELET
BASOS ABS: 0.1 10*3/uL (ref 0–0.1)
BASOS PCT: 1 %
BASOS PCT: 1 %
Basophils Absolute: 0 10*3/uL (ref 0–0.1)
EOS ABS: 0 10*3/uL (ref 0–0.7)
EOS PCT: 0 %
EOS PCT: 0 %
Eosinophils Absolute: 0 10*3/uL (ref 0–0.7)
HCT: 31.9 % — ABNORMAL LOW (ref 35.0–47.0)
HCT: 26.8 % — ABNORMAL LOW (ref 35.0–47.0)
Hemoglobin: 10.8 g/dL — ABNORMAL LOW (ref 12.0–16.0)
Hemoglobin: 8.9 g/dL — ABNORMAL LOW (ref 12.0–16.0)
LYMPHS ABS: 1.5 10*3/uL (ref 1.0–3.6)
LYMPHS PCT: 9 %
Lymphocytes Relative: 27 %
Lymphs Abs: 0.6 10*3/uL — ABNORMAL LOW (ref 1.0–3.6)
MCH: 32.6 pg (ref 26.0–34.0)
MCH: 31.7 pg (ref 26.0–34.0)
MCHC: 33.8 g/dL (ref 32.0–36.0)
MCHC: 33.3 g/dL (ref 32.0–36.0)
MCV: 96.5 fL (ref 80.0–100.0)
MCV: 95.4 fL (ref 80.0–100.0)
MONOS PCT: 11 %
Monocytes Absolute: 0.1 10*3/uL — ABNORMAL LOW (ref 0.2–0.9)
Monocytes Absolute: 0.6 10*3/uL (ref 0.2–0.9)
Monocytes Relative: 2 %
NEUTROS PCT: 61 %
Neutro Abs: 5.3 10*3/uL (ref 1.4–6.5)
Neutro Abs: 3.5 10*3/uL (ref 1.4–6.5)
Neutrophils Relative %: 88 %
PLATELETS: 243 10*3/uL (ref 150–440)
PLATELETS: 237 10*3/uL (ref 150–440)
RBC: 3.31 MIL/uL — AB (ref 3.80–5.20)
RBC: 2.81 MIL/uL — ABNORMAL LOW (ref 3.80–5.20)
RDW: 15 % — AB (ref 11.5–14.5)
RDW: 15 % — ABNORMAL HIGH (ref 11.5–14.5)
WBC: 6.1 10*3/uL (ref 3.6–11.0)
WBC: 5.7 10*3/uL (ref 3.6–11.0)

## 2017-02-12 LAB — TSH: TSH: 0.292 u[IU]/mL — ABNORMAL LOW (ref 0.350–4.500)

## 2017-02-12 LAB — URINALYSIS, COMPLETE (UACMP) WITH MICROSCOPIC
BACTERIA UA: NONE SEEN
Bilirubin Urine: NEGATIVE
Hgb urine dipstick: NEGATIVE
KETONES UR: 80 mg/dL — AB
Leukocytes, UA: NEGATIVE
Nitrite: NEGATIVE
PROTEIN: NEGATIVE mg/dL
Specific Gravity, Urine: 1.021 (ref 1.005–1.030)
pH: 5 (ref 5.0–8.0)

## 2017-02-12 LAB — GLUCOSE, CAPILLARY
GLUCOSE-CAPILLARY: 288 mg/dL — AB (ref 65–99)
GLUCOSE-CAPILLARY: 274 mg/dL — AB (ref 65–99)
Glucose-Capillary: 430 mg/dL — ABNORMAL HIGH (ref 65–99)
Glucose-Capillary: 358 mg/dL — ABNORMAL HIGH (ref 65–99)

## 2017-02-12 LAB — BETA-HYDROXYBUTYRIC ACID: BETA-HYDROXYBUTYRIC ACID: 5.38 mmol/L — AB (ref 0.05–0.27)

## 2017-02-12 LAB — BASIC METABOLIC PANEL
Anion gap: 13 (ref 5–15)
BUN: 26 mg/dL — ABNORMAL HIGH (ref 6–20)
CHLORIDE: 108 mmol/L (ref 101–111)
CO2: 17 mmol/L — ABNORMAL LOW (ref 22–32)
Calcium: 8.8 mg/dL — ABNORMAL LOW (ref 8.9–10.3)
Creatinine, Ser: 1.36 mg/dL — ABNORMAL HIGH (ref 0.44–1.00)
GFR calc non Af Amer: 35 mL/min — ABNORMAL LOW (ref >60)
GFR, EST AFRICAN AMERICAN: 41 mL/min — AB (ref >60)
Glucose, Bld: 328 mg/dL — ABNORMAL HIGH (ref 65–99)
POTASSIUM: 4 mmol/L (ref 3.5–5.1)
SODIUM: 138 mmol/L (ref 135–145)

## 2017-02-12 LAB — T4, FREE: FREE T4: 1.7 ng/dL — AB (ref 0.61–1.12)

## 2017-02-12 LAB — TROPONIN I: Troponin I: <0.03 ng/mL (ref <0.03)

## 2017-02-12 MED ORDER — ENOXAPARIN SODIUM 40 MG/0.4ML ~~LOC~~ SOLN
40.0000 mg | SUBCUTANEOUS | Status: DC
  Administered 2017-02-12: 40 mg via SUBCUTANEOUS
  Filled 2017-02-12: qty 0.4

## 2017-02-12 MED ORDER — ASPIRIN EC 81 MG PO TBEC
81.0000 mg | DELAYED_RELEASE_TABLET | Freq: Every day | ORAL | Status: DC
  Administered 2017-02-13: 81 mg via ORAL
  Filled 2017-02-12: qty 1

## 2017-02-12 MED ORDER — ACETAMINOPHEN 325 MG PO TABS
650.0000 mg | ORAL_TABLET | Freq: Four times a day (QID) | ORAL | Status: DC | PRN
  Administered 2017-02-15: 650 mg via ORAL
  Filled 2017-02-12: qty 2

## 2017-02-12 MED ORDER — INSULIN REGULAR HUMAN 100 UNIT/ML IJ SOLN: 10.0000 [IU] | Freq: Once | INTRAMUSCULAR | Status: DC

## 2017-02-12 MED ORDER — INSULIN ASPART 100 UNIT/ML ~~LOC~~ SOLN
0.0000 [IU] | Freq: Three times a day (TID) | SUBCUTANEOUS | Status: DC
  Administered 2017-02-13: 1 [IU] via SUBCUTANEOUS
  Administered 2017-02-13: 7 [IU] via SUBCUTANEOUS
  Administered 2017-02-13: 1 [IU] via SUBCUTANEOUS
  Administered 2017-02-14: 7 [IU] via SUBCUTANEOUS
  Administered 2017-02-14: 1 [IU] via SUBCUTANEOUS
  Administered 2017-02-14: 5 [IU] via SUBCUTANEOUS
  Administered 2017-02-14: 2 [IU] via SUBCUTANEOUS
  Administered 2017-02-15 (×2): 5 [IU] via SUBCUTANEOUS
  Administered 2017-02-15: 1 [IU] via SUBCUTANEOUS
  Administered 2017-02-16: 7 [IU] via SUBCUTANEOUS
  Filled 2017-02-12 (×11): qty 1

## 2017-02-12 MED ORDER — SODIUM CHLORIDE 0.9 % IV SOLN
Freq: Once | INTRAVENOUS | Status: AC
  Administered 2017-02-12: 13:00:00 via INTRAVENOUS

## 2017-02-12 MED ORDER — ONDANSETRON HCL 4 MG/2ML IJ SOLN
4.0000 mg | Freq: Once | INTRAMUSCULAR | Status: AC
  Administered 2017-02-12: 4 mg via INTRAVENOUS

## 2017-02-12 MED ORDER — INSULIN REGULAR HUMAN 100 UNIT/ML IJ SOLN
INTRAMUSCULAR | Status: DC
  Filled 2017-02-12: qty 1

## 2017-02-12 MED ORDER — SODIUM CHLORIDE 0.9 % IV SOLN
INTRAVENOUS | Status: AC
  Administered 2017-02-12: 23:00:00 via INTRAVENOUS

## 2017-02-12 MED ORDER — ONDANSETRON HCL 4 MG/2ML IJ SOLN
4.0000 mg | Freq: Four times a day (QID) | INTRAMUSCULAR | Status: DC | PRN
  Administered 2017-02-12: 4 mg via INTRAVENOUS
  Filled 2017-02-12: qty 2

## 2017-02-12 MED ORDER — INSULIN ASPART 100 UNIT/ML ~~LOC~~ SOLN: 10.0000 [IU] | Freq: Once | SUBCUTANEOUS | Status: DC

## 2017-02-12 MED ORDER — SODIUM CHLORIDE 0.9 % IV SOLN
Freq: Once | INTRAVENOUS | Status: AC
  Administered 2017-02-12: 16:00:00 via INTRAVENOUS

## 2017-02-12 MED ORDER — INSULIN GLARGINE 100 UNIT/ML ~~LOC~~ SOLN
22.0000 [IU] | Freq: Every day | SUBCUTANEOUS | Status: DC
  Administered 2017-02-12 – 2017-02-14 (×3): 22 [IU] via SUBCUTANEOUS
  Filled 2017-02-12 (×4): qty 0.22

## 2017-02-12 MED ORDER — INSULIN GLARGINE 100 UNIT/ML ~~LOC~~ SOLN: 22.0000 [IU] | Freq: Every day | SUBCUTANEOUS | Status: DC

## 2017-02-12 MED ORDER — CLOPIDOGREL BISULFATE 75 MG PO TABS
75.0000 mg | ORAL_TABLET | Freq: Every day | ORAL | Status: DC
  Administered 2017-02-13: 75 mg via ORAL
  Filled 2017-02-12: qty 1

## 2017-02-12 MED ORDER — PANTOPRAZOLE SODIUM 40 MG IV SOLR
40.0000 mg | Freq: Two times a day (BID) | INTRAVENOUS | Status: DC
  Administered 2017-02-12 – 2017-02-14 (×4): 40 mg via INTRAVENOUS
  Filled 2017-02-12 (×5): qty 40

## 2017-02-12 MED ORDER — ATORVASTATIN CALCIUM 20 MG PO TABS
80.0000 mg | ORAL_TABLET | Freq: Every day | ORAL | Status: DC
Start: 2017-02-13 — End: 2017-02-16
  Administered 2017-02-13 – 2017-02-15 (×3): 80 mg via ORAL
  Filled 2017-02-12 (×3): qty 4

## 2017-02-12 MED ORDER — LEVOTHYROXINE SODIUM 100 MCG PO TABS
100.0000 ug | ORAL_TABLET | Freq: Every day | ORAL | Status: DC
  Administered 2017-02-13 – 2017-02-16 (×4): 100 ug via ORAL
  Filled 2017-02-12 (×2): qty 1
  Filled 2017-02-12: qty 2
  Filled 2017-02-12: qty 1

## 2017-02-12 MED ORDER — ONDANSETRON HCL 4 MG/2ML IJ SOLN
INTRAMUSCULAR | Status: AC
  Administered 2017-02-12: 4 mg via INTRAVENOUS
  Filled 2017-02-12: qty 2

## 2017-02-12 MED ORDER — MIDODRINE HCL 5 MG PO TABS
10.0000 mg | ORAL_TABLET | Freq: Three times a day (TID) | ORAL | Status: DC
Start: 2017-02-13 — End: 2017-02-16
  Administered 2017-02-13 – 2017-02-16 (×4): 10 mg via ORAL
  Filled 2017-02-12 (×4): qty 2

## 2017-02-12 MED ORDER — ACETAMINOPHEN 650 MG RE SUPP: 650.0000 mg | Freq: Four times a day (QID) | RECTAL | Status: DC | PRN

## 2017-02-12 MED ORDER — ONDANSETRON HCL 4 MG PO TABS: 4.0000 mg | ORAL_TABLET | Freq: Four times a day (QID) | ORAL | Status: DC | PRN

## 2017-02-12 MED ORDER — PANTOPRAZOLE SODIUM 40 MG IV SOLR: 40.0000 mg | Freq: Two times a day (BID) | INTRAVENOUS | Status: DC

## 2017-02-12 MED ORDER — SODIUM BICARBONATE 650 MG PO TABS
650.0000 mg | ORAL_TABLET | ORAL | Status: AC
  Administered 2017-02-12: 650 mg via ORAL
  Filled 2017-02-12 (×3): qty 1

## 2017-02-12 MED ORDER — INSULIN ASPART 100 UNIT/ML ~~LOC~~ SOLN
10.0000 [IU] | Freq: Once | SUBCUTANEOUS | Status: AC
  Administered 2017-02-12: 10 [IU] via INTRAVENOUS
  Filled 2017-02-12: qty 1

## 2017-02-12 NOTE — H&P (Signed)
[facility] [facility] [place] NAME: [name] [name]    MR#:  [id number]  DATE OF BIRTH:  [birth year]  DATE OF ADMISSION:  02/12/2017  PRIMARY CARE PHYSICIAN: [name], DO   REQUESTING/REFERRING PHYSICIAN: Jimmye Norman, MD  CHIEF COMPLAINT:   Chief Complaint  Patient presents with  . Altered Mental Status    HISTORY OF PRESENT ILLNESS:  [name] [name]  is a 83 y.o. female who presents with Altered mental status. Patient states that she began having nausea and vomiting yesterday. She began feeling worse. She had an episode of black emesis with coffee ground material in it. She came to the ED today and was found to be in mild DKA. Hemoglobin was stable, other initial workup in the ED largely within normal limits.  Hospitalists were called for admission.  PAST MEDICAL HISTORY:   Past Medical History:  Diagnosis Date  . Anxiety state, unspecified   . CAD (coronary artery disease)   . Cancer [facility])    2004 Right breast, found on mammogram, radiation therapy, Dr. Bryson Ha, uterine cancer,   . COPD (chronic obstructive pulmonary disease) ([place])   . Diabetes mellitus   . Esophageal reflux   . Heart burn   . Hyperlipidemia   . IBS (irritable bowel syndrome)   . Myocardial infarction [facility])    Cath negative except for 40% occlusion LAD.  Pt not candidate for betablocker or ACEI because of hypotension  . OSA (obstructive sleep apnea)   . Osteoporosis 02/21/09   DEXA scan showed osteoporosis with left femur T-score -2.8.  Marland Kitchen Presbyacusis   . Stroke ([place])   . Thyroid disease    Hypothyroid  . Vitamin D deficiency     PAST SURGICAL HISTORY:   Past Surgical History:  Procedure Laterality Date  . ABDOMINAL HYSTERECTOMY     uterine cancer  . BREAST SURGERY    . COLON SURGERY  2013   done at [place]:   Social History  Substance Use Topics  . Smoking status: Never Smoker  . Smokeless tobacco: Never Used  . Alcohol use No     FAMILY HISTORY:   Family History  Problem Relation Age of Onset  . Diabetes Mother   . Heart attack Father     DRUG ALLERGIES:   Allergies  Allergen Reactions  . Penicillins Rash    Has patient had a PCN reaction causing immediate rash, facial/tongue/throat swelling, SOB or lightheadedness with hypotension: No Has patient had a PCN reaction causing severe rash involving mucus membranes or skin necrosis: No Has patient had a PCN reaction that required hospitalization No Has patient had a PCN reaction occurring within the last 10 years: No If all of the above answers are "NO", then may proceed with Cephalosporin use.     MEDICATIONS AT HOME:   Prior to Admission medications   Medication Sig Start Date End Date Taking? Authorizing Provider  aspirin EC 81 MG tablet Take 1 tablet (81 mg total) by mouth daily. 04/09/16  Yes Cook, Jayce G, DO  atorvastatin (LIPITOR) 80 MG tablet Take 1 tablet (80 mg total) by mouth daily. 08/21/16  Yes Cook, Jayce G, DO  clopidogrel (PLAVIX) 75 MG tablet TAKE 1 TABLET(75 MG) BY MOUTH DAILY 01/01/17  Yes Cook, Jayce G, DO  fluticasone (FLONASE) 50 MCG/ACT nasal spray Place 2 sprays into both nostrils daily. 01/01/17  Yes Cook, Jayce G, DO  glucagon 1 MG injection Inject 1 mg  into the vein once as needed.   Yes [provider]  insulin glargine (LANTUS) 100 UNIT/ML injection Inject 22 Units into the skin at bedtime.    Yes [provider]  insulin lispro (HUMALOG) 100 UNIT/ML injection Inject 6-10 Units into the skin 3 (three) times daily before meals. take 10 units at breakfast, 6 units at lunch, 10 units at supper.   Yes [provider]  levothyroxine (SYNTHROID, LEVOTHROID) 100 MCG tablet TAKE 1 TABLET(100 MCG) BY MOUTH DAILY 01/01/17  Yes Cook, Jayce G, DO  metFORMIN (GLUCOPHAGE) 500 MG tablet TAKE 1 TABLET(500 MG) BY MOUTH TWICE DAILY WITH A MEAL 10/20/16  Yes Cook, Jayce G, DO  midodrine (PROAMATINE) 10 MG tablet Take 1 tablet  (10 mg total) by mouth 3 (three) times daily with meals. 01/30/17  Yes Cook, Jayce G, DO  pantoprazole (PROTONIX) 40 MG tablet Take 1 tablet (40 mg total) by mouth 2 (two) times daily. 01/04/16  Yes Jackolyn Confer, MD    REVIEW OF SYSTEMS:  Review of Systems  Constitutional: Positive for malaise/fatigue. Negative for chills, fever and weight loss.  HENT: Negative for ear pain, hearing loss and tinnitus.   Eyes: Negative for blurred vision, double vision, pain and redness.  Respiratory: Negative for cough, hemoptysis and shortness of breath.   Cardiovascular: Negative for chest pain, palpitations, orthopnea and leg swelling.  Gastrointestinal: Positive for nausea and vomiting. Negative for abdominal pain, constipation and diarrhea.       Coffee-ground emesis  Genitourinary: Negative for dysuria, frequency and hematuria.  Musculoskeletal: Negative for back pain, joint pain and neck pain.  Skin:       No acne, rash, or lesions  Neurological: Negative for dizziness, tremors, focal weakness and weakness.  Endo/Heme/Allergies: Negative for polydipsia. Does not bruise/bleed easily.  Psychiatric/Behavioral: Negative for depression. The patient is not nervous/anxious and does not have insomnia.      VITAL SIGNS:   Vitals:   02/12/17 1300 02/12/17 1330 02/12/17 1400 02/12/17 1430  BP: (!) 121/52 (!) 128/52 (!) 122/54 (!) 126/58  Pulse: 96  97 100  Resp: 18 19 19 17   Temp:      TempSrc:      SpO2: 98%  96% 97%  Weight:      Height:       Wt Readings from Last 3 Encounters:  02/12/17 63.5 kg (140 lb)  02/02/17 63.5 kg (140 lb)  01/29/17 63.5 kg (140 lb)    PHYSICAL EXAMINATION:  Physical Exam  Vitals reviewed. Constitutional: She appears well-developed and well-nourished. No distress.  HENT:  Head: Normocephalic and atraumatic.  Dry mucous membranes  Eyes: Pupils are equal, round, and reactive to light. Conjunctivae and EOM are normal. No scleral icterus.  Neck: Normal range of  motion. Neck supple. No JVD present. No thyromegaly present.  Cardiovascular: Normal rate, regular rhythm and intact distal pulses.  Exam reveals no gallop and no friction rub.   No murmur heard. Respiratory: Effort normal and breath sounds normal. No respiratory distress. She has no wheezes. She has no rales.  GI: Soft. Bowel sounds are normal. She exhibits no distension. There is tenderness.  Musculoskeletal: Normal range of motion. She exhibits no edema.  No arthritis, no gout  Lymphadenopathy:    She has no cervical adenopathy.  Neurological: She is alert. No cranial nerve deficit.  Patient is alert, but is not oriented.  Skin: Skin is warm and dry. No rash noted. No erythema.  Psychiatric:  Unable to  assess due to patient condition    LABORATORY PANEL:   CBC  Recent Labs Lab 02/12/17 1132  WBC 6.1  HGB 10.8*  HCT 31.9*  PLT 243   ------------------------------------------------------------------------------------------------------------------  Chemistries   Recent Labs Lab 02/12/17 1132  NA 135  K 4.9  CL 100*  CO2 18*  GLUCOSE 463*  BUN 25*  CREATININE 1.38*  CALCIUM 9.4  AST 46*  ALT 31  ALKPHOS 147*  BILITOT 1.7*   ------------------------------------------------------------------------------------------------------------------  Cardiac Enzymes  Recent Labs Lab 02/12/17 1132  TROPONINI <0.03   ------------------------------------------------------------------------------------------------------------------  RADIOLOGY:  Dg Chest 1 View  Result Date: 02/12/2017 CLINICAL DATA:  83 year old with acute mental status changes, possible hematemesis, and malodorous urine. Current history of COPD, coronary artery disease, diabetes. EXAM: Portable CHEST 1 VIEW COMPARISON:  02/02/2017, 01/13/2017 and earlier, including CTA chest 11/11/2016. FINDINGS: Cardiac silhouette normal in size for technique, unchanged. Thoracic aorta atherosclerotic and mildly  tortuous, unchanged. Hilar and mediastinal contours otherwise unremarkable. Lungs clear. Bronchovascular markings normal. Pulmonary vascularity normal. No visible pleural effusions. No pneumothorax. Osseous demineralization. IMPRESSION: 1.  No acute cardiopulmonary disease. 2. Thoracic aortic atherosclerosis. Electronically Signed   By: Evangeline Dakin M.D.   On: 02/12/2017 12:22   Ct Head Wo Contrast  Result Date: 02/12/2017 CLINICAL DATA:  Altered mental status, possible UTI EXAM: CT HEAD WITHOUT CONTRAST TECHNIQUE: Contiguous axial images were obtained from the base of the skull through the vertex without intravenous contrast. COMPARISON:  02/02/2017 FINDINGS: Brain: Diffuse atrophic changes are noted. Scattered white matter ischemic changes are seen stable in appearance from the prior exam. No findings to suggest acute hemorrhage, acute infarction or space-occupying mass lesion are noted. Vascular: No hyperdense vessel or unexpected calcification. Skull: Normal. Negative for fracture or focal lesion. Sinuses/Orbits: No acute finding. Other: None. IMPRESSION: Chronic atrophic and ischemic changes stable from the recent exam. No acute abnormality noted. Electronically Signed   By: Inez Catalina M.D.   On: 02/12/2017 12:45    EKG:   Orders placed or performed during the hospital encounter of 02/12/17  . ED EKG  . ED EKG  . EKG 12-Lead  . EKG 12-Lead    IMPRESSION AND PLAN:  Principal Problem:   DKA (diabetic ketoacidoses) ([place]) - patient's anion gap is elevated, but just barely. We will hold off on using an insulin drip at this time, we will give her some push IV insulin and recheck her labs. We'll hydrate her with IV fluids Active Problems:   Acute on chronic renal failure (HCC) - related to her DKA and dehydration, IV fluids above, avoid nephrotoxins and monitor   Coffee ground emesis - IV PPI, GI consult, nothing by mouth for now except for sips with meds   CAD (coronary artery disease) -  continue home meds   Hypothyroidism - home dose thyroid replacement   GERD (gastroesophageal reflux disease) - PPI as above  All the records are reviewed and case discussed with ED provider. Management plans discussed with the patient and/or family.  DVT PROPHYLAXIS: SubQ lovenox  GI PROPHYLAXIS: PPI  ADMISSION STATUS: Inpatient  CODE STATUS: Full Code Status History    Date Active Date Inactive Code Status Order ID Comments User Context   01/13/2017  8:22 PM 01/16/2017  7:10 PM Full Code [id number]  Harvie Bridge, DO Inpatient   11/06/2016 11:26 AM 11/15/2016  2:06 AM DNR [id number]  Flora Lipps, MD Inpatient   11/06/2016  3:25 AM 11/06/2016 11:26 AM Full Code [id number]  Pyreddy, Reatha Harps,  MD Inpatient   07/15/2016  6:36 PM 07/17/2016 10:11 PM Full Code [id number]  Fritzi Mandes, MD Inpatient   06/08/2015  9:01 PM 06/11/2015  9:20 PM Full Code [id number]  Henreitta Leber, MD Inpatient   06/08/2015  5:16 PM 06/08/2015  9:01 PM Full Code [id number]  Henreitta Leber, MD ED      TOTAL TIME TAKING CARE OF THIS PATIENT: 45 minutes.   [name] [name] FIELDING 02/12/2017, 3:52 PM  [employer]  [phone]  CC: Primary care physician; [name], DO  Note:  This document was prepared using Dragon voice recognition software and may include unintentional dictation errors.

## 2017-02-12 NOTE — ED Notes (Signed)
Admission MD at bedside.  

## 2017-02-12 NOTE — ED Notes (Signed)
Patient cleaned and gown changed along with sheets.  Patient tolerated this well.

## 2017-02-12 NOTE — ED Notes (Signed)
CBG 288 

## 2017-02-12 NOTE — ED Provider Notes (Signed)
[facility] Emergency Department Provider Note       Time seen: ----------------------------------------- 11:31 AM on 02/12/2017 -----------------------------------------  Level V caveat: History/ROS limited by altered mental status   I have reviewed the triage vital signs and the nursing notes.   HISTORY   Chief Complaint Altered Mental Status    HPI [name] [name] is a 83 y.o. female who presents to the ED for altered mental status. Patient presents from home with vomiting and foul-smelling urine. Blood sugar was reportedly elevated by EMS to over 500. Patient has a great granddaughter takes care of her during the day. She is alert and oriented to person only.   Past Medical History:  Diagnosis Date  . Anxiety state, unspecified   . CAD (coronary artery disease)   . Cancer [facility])    2004 Right breast, found on mammogram, radiation therapy, Dr. Bryson Ha, uterine cancer,   . COPD (chronic obstructive pulmonary disease) ([place])   . Diabetes mellitus   . Esophageal reflux   . Heart burn   . Hyperlipidemia   . IBS (irritable bowel syndrome)   . Myocardial infarction [facility])    Cath negative except for 40% occlusion LAD.  Pt not candidate for betablocker or ACEI because of hypotension  . OSA (obstructive sleep apnea)   . Osteoporosis 02/21/09   DEXA scan showed osteoporosis with left femur T-score -2.8.  Marland Kitchen Presbyacusis   . Stroke ([place])   . Thyroid disease    Hypothyroid  . Vitamin D deficiency     Patient Active Problem List   Diagnosis Date Noted  . AKI (acute kidney injury) ([place]) 01/13/2017  . Dementia, vascular 12/03/2016  . Stroke ([place]) 08/20/2016  . Peripheral vascular disease ([place]) 04/09/2016  . CKD stage 3 due to type 2 diabetes mellitus ([place]) 02/22/2014  . Hypothyroidism 09/23/2012  . GERD (gastroesophageal reflux disease) 09/23/2012  . Anxiety 07/26/2012  . Hyperlipidemia with target LDL less than 70 12/03/2011  . DM (diabetes  mellitus) type II uncontrolled, periph vascular disorder ([place]) 04/03/2011  . CAD (coronary artery disease) 12/13/2009    Past Surgical History:  Procedure Laterality Date  . ABDOMINAL HYSTERECTOMY     uterine cancer  . BREAST SURGERY    . COLON SURGERY  2013   done at [facility]    Allergies Penicillins  Social History Social History  Substance Use Topics  . Smoking status: Never Smoker  . Smokeless tobacco: Never Used  . Alcohol use No    Review of Systems Unknown, patient has altered mental status  All systems negative/normal/unremarkable except as stated in the HPI  ____________________________________________   PHYSICAL EXAM:  VITAL SIGNS: ED Triage Vitals [02/12/17 1130]  Enc Vitals Group     BP      Pulse      Resp      Temp      Temp src      SpO2      Weight 140 lb (63.5 kg)     Height 5\' 6"  (1.676 m)     Head Circumference      Peak Flow      Pain Score      Pain Loc      Pain Edu?      Excl. in [place]?     Constitutional: Alert but disoriented, no distress Eyes: Conjunctivae are normal. Normal extraocular movements. ENT   Head: Normocephalic and atraumatic.   Nose: No congestion/rhinnorhea.   Mouth/Throat: Mucous membranes are moist.  Neck: No stridor. Cardiovascular: Normal rate, regular rhythm. No murmurs, rubs, or gallops. Respiratory: Normal respiratory effort without tachypnea nor retractions. Breath sounds are clear and equal bilaterally. No wheezes/rales/rhonchi. Gastrointestinal: Soft and nontender. Normal bowel sounds Musculoskeletal: Nontender with normal range of motion in extremities. No lower extremity tenderness nor edema. Neurologic:  No gross focal neurologic deficits are appreciated. Generalized weakness Skin:  Skin is warm, dry and intact. No rash noted. Psychiatric: Mood and affect are normal. Speech and behavior are normal.  ____________________________________________  EKG: Interpreted by me.Sinus tachycardia with  a rate of 100 bpm, normal PR interval, widened QRS, normal QT, nonspecific ST changes  ____________________________________________  ED COURSE:  Pertinent labs & imaging results that were available during my care of the patient were reviewed by me and considered in my medical decision making (see chart for details). Patient presents for altered mental status, we will assess with labs and imaging as indicated.   Procedures ____________________________________________   LABS (pertinent positives/negatives)  Labs Reviewed  CBC WITH DIFFERENTIAL/PLATELET - Abnormal; Notable for the following:       Result Value   RBC 3.31 (*)    Hemoglobin 10.8 (*)    HCT 31.9 (*)    RDW 15.0 (*)    Lymphs Abs 0.6 (*)    Monocytes Absolute 0.1 (*)    All other components within normal limits  COMPREHENSIVE METABOLIC PANEL - Abnormal; Notable for the following:    Chloride 100 (*)    CO2 18 (*)    Glucose, Bld 463 (*)    BUN 25 (*)    Creatinine, Ser 1.38 (*)    AST 46 (*)    Alkaline Phosphatase 147 (*)    Total Bilirubin 1.7 (*)    GFR calc non Af Amer 35 (*)    GFR calc Af Amer 40 (*)    Anion gap 17 (*)    All other components within normal limits  URINALYSIS, COMPLETE (UACMP) WITH MICROSCOPIC - Abnormal; Notable for the following:    Color, Urine STRAW (*)    APPearance CLEAR (*)    Glucose, UA >=500 (*)    Ketones, ur 80 (*)    Squamous Epithelial / LPF 0-5 (*)    All other components within normal limits  T4, FREE - Abnormal; Notable for the following:    Free T4 1.70 (*)    All other components within normal limits  TSH - Abnormal; Notable for the following:    TSH 0.292 (*)    All other components within normal limits  TROPONIN I  BETA-HYDROXYBUTYRIC ACID  BLOOD GAS, VENOUS  CBG MONITORING, ED    RADIOLOGY Images were viewed by me  CT head, chest x-ray Are unremarkable ____________________________________________  FINAL ASSESSMENT AND PLAN  Altered mental status,  Hyperglycemia  Plan: Patient's labs and imaging were dictated above. Patient had presented for altered mental status. Patient is likely a mild DKA given her high anion gap with her hyperglycemia. She does have ketones in her urine and we will start on insulin infusion. She remains confused but is in otherwise no acute distress. Patient is also likely on to have a dose of Synthroid and this will need to be lowered during her hospital stay.   Earleen Newport, MD   Note: This note was generated in part or whole with voice recognition software. Voice recognition is usually quite accurate but there are transcription errors that can and very often do occur. I apologize for any typographical  errors that were not detected and corrected.     Earleen Newport, MD 02/12/17 [phone]

## 2017-02-12 NOTE — ED Notes (Signed)
Spoke with patient's granddaughter who explained that the patient is normally responsive and able to communicate with no difficulty.  Patient states she is not normally confused and resides at home with her son.  Granddaughter says she is insulin dependent and her sugars have been running well at home.  She woke the patient up this morning and found vomit all in the bed and the patient incredibly confused from her baseline.  MD notified.

## 2017-02-12 NOTE — ED Triage Notes (Signed)
Pt comes into the ED via EMS from home c/o altered mental status, emesis that was dark red and brown in color, and foul odor with urine.  Patient had CBG over 500 with EMS.  Patient has a great granddaughter that takes care of her during the day.  Patient alert and oriented to person.

## 2017-02-12 NOTE — ED Notes (Addendum)
Patient transported to CT 

## 2017-02-13 LAB — BASIC METABOLIC PANEL
ANION GAP: 6 (ref 5–15)
BUN: 29 mg/dL — ABNORMAL HIGH (ref 6–20)
CALCIUM: 8.9 mg/dL (ref 8.9–10.3)
CHLORIDE: 111 mmol/L (ref 101–111)
CO2: 24 mmol/L (ref 22–32)
CREATININE: 1.18 mg/dL — AB (ref 0.44–1.00)
GFR calc Af Amer: 48 mL/min — ABNORMAL LOW (ref >60)
GFR calc non Af Amer: 42 mL/min — ABNORMAL LOW (ref >60)
GLUCOSE: 184 mg/dL — AB (ref 65–99)
Potassium: 3.9 mmol/L (ref 3.5–5.1)
Sodium: 141 mmol/L (ref 135–145)

## 2017-02-13 LAB — GLUCOSE, CAPILLARY
GLUCOSE-CAPILLARY: 172 mg/dL — AB (ref 65–99)
GLUCOSE-CAPILLARY: 150 mg/dL — AB (ref 65–99)
GLUCOSE-CAPILLARY: 118 mg/dL — AB (ref 65–99)
GLUCOSE-CAPILLARY: 148 mg/dL — AB (ref 65–99)
GLUCOSE-CAPILLARY: 336 mg/dL — AB (ref 65–99)
GLUCOSE-CAPILLARY: 125 mg/dL — AB (ref 65–99)
Glucose-Capillary: 204 mg/dL — ABNORMAL HIGH (ref 65–99)

## 2017-02-13 LAB — COMPREHENSIVE METABOLIC PANEL
ALBUMIN: 3.4 g/dL — AB (ref 3.5–5.0)
ALK PHOS: 113 U/L (ref 38–126)
ALT: 26 U/L (ref 14–54)
ANION GAP: 9 (ref 5–15)
AST: 30 U/L (ref 15–41)
BILIRUBIN TOTAL: 0.9 mg/dL (ref 0.3–1.2)
BUN: 28 mg/dL — ABNORMAL HIGH (ref 6–20)
CALCIUM: 9.1 mg/dL (ref 8.9–10.3)
CO2: 22 mmol/L (ref 22–32)
Chloride: 107 mmol/L (ref 101–111)
Creatinine, Ser: 1.27 mg/dL — ABNORMAL HIGH (ref 0.44–1.00)
GFR calc non Af Amer: 38 mL/min — ABNORMAL LOW (ref >60)
GFR, EST AFRICAN AMERICAN: 44 mL/min — AB (ref >60)
GLUCOSE: 295 mg/dL — AB (ref 65–99)
POTASSIUM: 4.8 mmol/L (ref 3.5–5.1)
SODIUM: 138 mmol/L (ref 135–145)
TOTAL PROTEIN: 6.4 g/dL — AB (ref 6.5–8.1)

## 2017-02-13 LAB — CBC
HCT: 26.3 % — ABNORMAL LOW (ref 35.0–47.0)
Hemoglobin: 9 g/dL — ABNORMAL LOW (ref 12.0–16.0)
MCH: 33.2 pg (ref 26.0–34.0)
MCHC: 34.4 g/dL (ref 32.0–36.0)
MCV: 96.4 fL (ref 80.0–100.0)
Platelets: 210 10*3/uL (ref 150–440)
RBC: 2.72 MIL/uL — ABNORMAL LOW (ref 3.80–5.20)
RDW: 14.7 % — ABNORMAL HIGH (ref 11.5–14.5)
WBC: 5.5 10*3/uL (ref 3.6–11.0)

## 2017-02-13 NOTE — Consult Note (Signed)
   [facility] CM Inpatient Consult   02/13/2017  [name] [name] Feb 08, 1932 [id number]   Patient is currently active with [facility] for chronic disease management services.  Patient has been engaged by a [employer] and CSW.  Our community based plan of care has focused on disease management and community resource support.  Patient will receive a post discharge transition of care call and will be evaluated for monthly home visits for assessments and disease process education.  Made Inpatient Case Manager aware that [facility] following. Of note, [facility] services does not replace or interfere with any services that are needed or arranged by inpatient case management or social work.  For additional questions or referrals please contact:  Mora Pedraza RN, [facility] Liaison  [phone]) [place] [phone]) Toll free office

## 2017-02-13 NOTE — Progress Notes (Signed)
[facility] [place] NAME: [name] [name]    MR#:  [id number]  DATE OF BIRTH:  1932/06/25  SUBJECTIVE:   Answered basic questions appropriately Hungry. No more vomting  REVIEW OF SYSTEMS:   Review of Systems  Constitutional: Negative for chills, fever and weight loss.  HENT: Negative for ear discharge, ear pain and nosebleeds.   Eyes: Negative for blurred vision, pain and discharge.  Respiratory: Negative for sputum production, shortness of breath, wheezing and stridor.   Cardiovascular: Negative for chest pain, palpitations, orthopnea and PND.  Gastrointestinal: Negative for abdominal pain, diarrhea, nausea and vomiting.  Genitourinary: Negative for frequency and urgency.  Musculoskeletal: Negative for back pain and joint pain.  Neurological: Negative for sensory change, speech change, focal weakness and weakness.  Psychiatric/Behavioral: Negative for depression and hallucinations. The patient is not nervous/anxious.    Tolerating Diet:FLD Tolerating PT: pending  DRUG ALLERGIES:   Allergies  Allergen Reactions  . Penicillins Rash    Has patient had a PCN reaction causing immediate rash, facial/tongue/throat swelling, SOB or lightheadedness with hypotension: No Has patient had a PCN reaction causing severe rash involving mucus membranes or skin necrosis: No Has patient had a PCN reaction that required hospitalization No Has patient had a PCN reaction occurring within the last 10 years: No If all of the above answers are "NO", then may proceed with Cephalosporin use.     VITALS:  Blood pressure (!) 105/53, pulse 79, temperature 98.2 F (36.8 C), temperature source Oral, resp. rate 18, height 5\' 6"  (1.676 m), weight 63 kg (138 lb 12.8 oz), SpO2 94 %.  PHYSICAL EXAMINATION:   Physical Exam  GENERAL:  83 y.o.-year-old patient lying in the bed with no acute distress.  EYES: Pupils equal, round, reactive to light and  accommodation. No scleral icterus. Extraocular muscles intact.  HEENT: Head atraumatic, normocephalic. Oropharynx and nasopharynx clear.  NECK:  Supple, no jugular venous distention. No thyroid enlargement, no tenderness.  LUNGS: Normal breath sounds bilaterally, no wheezing, rales, rhonchi. No use of accessory muscles of respiration.  CARDIOVASCULAR: S1, S2 normal. No murmurs, rubs, or gallops.  ABDOMEN: Soft, nontender, nondistended. Bowel sounds present. No organomegaly or mass.  EXTREMITIES: No cyanosis, clubbing or edema b/l.    NEUROLOGIC: Cranial nerves II through XII are intact. No focal Motor or sensory deficits b/l.   PSYCHIATRIC:  patient is alert and oriented x 3.  SKIN: No obvious rash, lesion, or ulcer.   LABORATORY PANEL:  CBC  Recent Labs Lab 02/13/17 0502  WBC 5.5  HGB 9.0*  HCT 26.3*  PLT 210    Chemistries   Recent Labs Lab 02/12/17 2315 02/13/17 0502  NA 138 141  K 4.8 3.9  CL 107 111  CO2 22 24  GLUCOSE 295* 184*  BUN 28* 29*  CREATININE 1.27* 1.18*  CALCIUM 9.1 8.9  AST 30  --   ALT 26  --   ALKPHOS 113  --   BILITOT 0.9  --    Cardiac Enzymes  Recent Labs Lab 02/12/17 1132  TROPONINI <0.03   RADIOLOGY:  Dg Chest 1 View  Result Date: 02/12/2017 CLINICAL DATA:  83 year old with acute mental status changes, possible hematemesis, and malodorous urine. Current history of COPD, coronary artery disease, diabetes. EXAM: Portable CHEST 1 VIEW COMPARISON:  02/02/2017, 01/13/2017 and earlier, including CTA chest 11/11/2016. FINDINGS: Cardiac silhouette normal in size for technique, unchanged. Thoracic aorta atherosclerotic and mildly tortuous, unchanged. Hilar and mediastinal  contours otherwise unremarkable. Lungs clear. Bronchovascular markings normal. Pulmonary vascularity normal. No visible pleural effusions. No pneumothorax. Osseous demineralization. IMPRESSION: 1.  No acute cardiopulmonary disease. 2. Thoracic aortic atherosclerosis. Electronically  Signed   By: [name] [name] M.D.   On: 02/12/2017 12:22   Ct Head Wo Contrast  Result Date: 02/12/2017 CLINICAL DATA:  Altered mental status, possible UTI EXAM: CT HEAD WITHOUT CONTRAST TECHNIQUE: Contiguous axial images were obtained from the base of the skull through the vertex without intravenous contrast. COMPARISON:  02/02/2017 FINDINGS: Brain: Diffuse atrophic changes are noted. Scattered white matter ischemic changes are seen stable in appearance from the prior exam. No findings to suggest acute hemorrhage, acute infarction or space-occupying mass lesion are noted. Vascular: No hyperdense vessel or unexpected calcification. Skull: Normal. Negative for fracture or focal lesion. Sinuses/Orbits: No acute finding. Other: None. IMPRESSION: Chronic atrophic and ischemic changes stable from the recent exam. No acute abnormality noted. Electronically Signed   By: Inez Catalina M.D.   On: 02/12/2017 12:45   ASSESSMENT AND PLAN:  [name] [name]  is a 83 y.o. female who presents with Altered mental status. Patient states that she began having nausea and vomiting yesterday. She began feeling worse. She had an episode of black emesis with coffee ground material in it. She came to the ED today and was found to be in mild DKA.   * DKA (diabetic ketoacidoses) ([place]) - patient's anion gap is elevated, but just barely.  -now off IV insulin drip  -resuemd home dose insulin  *  Acute on chronic renal failure (HCC) - related to her DKA and dehydration, IV fluids above, avoid nephrotoxins and monitor  *  Coffee ground emesis - IV PPI, GI consult pending---it did not get called in the ICU  *  CAD (coronary artery disease) - continue home meds  *  Hypothyroidism - home dose thyroid replacement  *  GERD (gastroesophageal reflux disease) - PPI as above   Case discussed with Care Management/Social Worker. Management plans discussed with the patient, family and they are in agreement.  CODE STATUS: Full  code  DVT Prophylaxis: SCD TOTAL TIME TAKING CARE OF THIS PATIENT: *30* minutes.  >50% time spent on counselling and coordination of care  POSSIBLE D/C IN *1-2* DAYS, DEPENDING ON CLINICAL CONDITION.  Note: This dictation was prepared with Dragon dictation along with smaller phrase technology. Any transcriptional errors that result from this process are unintentional.  Kaelene Elliston M.D on 02/13/2017 at 5:17 PM  Between 7am to 6pm - Pager - [phone]  After 6pm go to [email or web address] - password [email or web address] [facility]  Office  [phone]  CC: Primary care physician; [name], DO

## 2017-02-13 NOTE — Progress Notes (Signed)
Inpatient Diabetes Program Recommendations  AACE/ADA: New Consensus Statement on Inpatient Glycemic Control (2015)  Target Ranges:  Prepandial:   less than 140 mg/dL      Peak postprandial:   less than 180 mg/dL (1-2 hours)      Critically ill patients:  140 - 180 mg/dL   Lab Results  Component Value Date   GLUCAP 150 (H) 02/13/2017   HGBA1C 10.2 (H) 11/07/2016    Review of Glycemic Control  Results for KASSITY, WOODSON (MRN [id number]) as of 02/13/2017 10:30  Ref. Range 02/12/2017 22:14 02/12/2017 22:54 02/13/2017 03:12 02/13/2017 04:53 02/13/2017 07:45  Glucose-Capillary Latest Ref Range: 65 - 99 mg/dL 288 (H) 274 (H) 204 (H) 172 (H) 150 (H)    Diabetes history: Type 2 Outpatient Diabetes medications: Lantus 22 units q day, Novolog 10 units with breakfast, Novolog 6 units with lunch, Novolog 10 units with supper, Metformin 500mg  bid  Current orders for Inpatient glycemic control: Lantus 22 units qhs, Novolog 0-9 units tid  Inpatient Diabetes Program Recommendations: Please d/c Novolog insulin order 10 units once.    Continue Novolog 0-9 tid and Lantus 22 units qpm.   Gentry Fitz, RN, BA, [place], CDE Diabetes Coordinator Inpatient Diabetes Program  [phone] (Team Pager) [phone] ([place]) 02/13/2017 10:58 AM

## 2017-02-13 NOTE — Progress Notes (Signed)
Patient admitted for AMS N/V w/ coffee ground emesis. Patient has a h/o hypothyroidism taking levothyroxine 100 mcg daily before breakfast.  TSH 12 hours ago 7/19: 0.292 (L) Spoke to MD to decreasing dose to 88 mcg and rechecking in two weeks. Plan will be to hold off switching dose for now and have primary care follow up.  Tobie Lords, PharmD, BCPS Clinical Pharmacist 02/13/2017

## 2017-02-14 DIAGNOSIS — K920 Hematemesis: Secondary | ICD-10-CM

## 2017-02-14 LAB — GLUCOSE, CAPILLARY
GLUCOSE-CAPILLARY: 38 mg/dL — AB (ref 65–99)
GLUCOSE-CAPILLARY: 43 mg/dL — AB (ref 65–99)
GLUCOSE-CAPILLARY: 124 mg/dL — AB (ref 65–99)
GLUCOSE-CAPILLARY: 166 mg/dL — AB (ref 65–99)
Glucose-Capillary: 155 mg/dL — ABNORMAL HIGH (ref 65–99)
Glucose-Capillary: 122 mg/dL — ABNORMAL HIGH (ref 65–99)
Glucose-Capillary: 320 mg/dL — ABNORMAL HIGH (ref 65–99)
Glucose-Capillary: 253 mg/dL — ABNORMAL HIGH (ref 65–99)

## 2017-02-14 MED ORDER — DEXTROSE 50 % IV SOLN
1.0000 | Freq: Once | INTRAVENOUS | Status: AC
  Administered 2017-02-14: 50 mL via INTRAVENOUS
  Filled 2017-02-14: qty 50

## 2017-02-14 MED ORDER — PANTOPRAZOLE SODIUM 40 MG PO TBEC
40.0000 mg | DELAYED_RELEASE_TABLET | Freq: Two times a day (BID) | ORAL | Status: DC
  Administered 2017-02-14 – 2017-02-16 (×4): 40 mg via ORAL
  Filled 2017-02-14 (×4): qty 1

## 2017-02-14 NOTE — Progress Notes (Signed)
[facility] [place] NAME: [name] [name]    MR#:  [id number]  DATE OF BIRTH:  1931-11-13  SUBJECTIVE:   Answered basic questions appropriately Hungry. No more vomiting Wants to eat soft diet  REVIEW OF SYSTEMS:   Review of Systems  Constitutional: Negative for chills, fever and weight loss.  HENT: Negative for ear discharge, ear pain and nosebleeds.   Eyes: Negative for blurred vision, pain and discharge.  Respiratory: Negative for sputum production, shortness of breath, wheezing and stridor.   Cardiovascular: Negative for chest pain, palpitations, orthopnea and PND.  Gastrointestinal: Negative for abdominal pain, diarrhea, nausea and vomiting.  Genitourinary: Negative for frequency and urgency.  Musculoskeletal: Negative for back pain and joint pain.  Neurological: Negative for sensory change, speech change, focal weakness and weakness.  Psychiatric/Behavioral: Negative for depression and hallucinations. The patient is not nervous/anxious.    Tolerating Diet:FLD Tolerating PT: SNF  DRUG ALLERGIES:   Allergies  Allergen Reactions  . Penicillins Rash    Has patient had a PCN reaction causing immediate rash, facial/tongue/throat swelling, SOB or lightheadedness with hypotension: No Has patient had a PCN reaction causing severe rash involving mucus membranes or skin necrosis: No Has patient had a PCN reaction that required hospitalization No Has patient had a PCN reaction occurring within the last 10 years: No If all of the above answers are "NO", then may proceed with Cephalosporin use.     VITALS:  Blood pressure (!) 115/58, pulse (!) 55, temperature 97.7 F (36.5 C), temperature source Oral, resp. rate 18, height 5\' 6"  (1.676 m), weight 63 kg (138 lb 12.8 oz), SpO2 100 %.  PHYSICAL EXAMINATION:   Physical Exam  GENERAL:  83 y.o.-year-old patient lying in the bed with no acute distress.  EYES: Pupils equal, round,  reactive to light and accommodation. No scleral icterus. Extraocular muscles intact.  HEENT: Head atraumatic, normocephalic. Oropharynx and nasopharynx clear.  NECK:  Supple, no jugular venous distention. No thyroid enlargement, no tenderness.  LUNGS: Normal breath sounds bilaterally, no wheezing, rales, rhonchi. No use of accessory muscles of respiration.  CARDIOVASCULAR: S1, S2 normal. No murmurs, rubs, or gallops.  ABDOMEN: Soft, nontender, nondistended. Bowel sounds present. No organomegaly or mass.  EXTREMITIES: No cyanosis, clubbing or edema b/l.    NEUROLOGIC: Cranial nerves II through XII are intact. No focal Motor or sensory deficits b/l.   PSYCHIATRIC:  patient is alert and awake SKIN: No obvious rash, lesion, or ulcer.   LABORATORY PANEL:  CBC  Recent Labs Lab 02/13/17 0502  WBC 5.5  HGB 9.0*  HCT 26.3*  PLT 210    Chemistries   Recent Labs Lab 02/12/17 2315 02/13/17 0502  NA 138 141  K 4.8 3.9  CL 107 111  CO2 22 24  GLUCOSE 295* 184*  BUN 28* 29*  CREATININE 1.27* 1.18*  CALCIUM 9.1 8.9  AST 30  --   ALT 26  --   ALKPHOS 113  --   BILITOT 0.9  --    Cardiac Enzymes  Recent Labs Lab 02/12/17 1132  TROPONINI <0.03   RADIOLOGY:  No results found. ASSESSMENT AND PLAN:  [name] [name]  is a 83 y.o. female who presents with Altered mental status. Patient states that she began having nausea and vomiting yesterday. She began feeling worse. She had an episode of black emesis with coffee ground material in it. She came to the ED today and was found to be in mild  DKA.   * DKA (diabetic ketoacidoses) ([place]) - patient's anion gap is elevated, but just barely.  -now off IV insulin drip  -resumed home dose insulin  *  Acute on chronic renal failure (HCC) - related to her DKA and dehydration -recieved IV fluids  - avoid nephrotoxins   *  Coffee ground emesis - Received IV PPI -GI consult pending---it did not get called in the ICU -Hemoglobin stable. Patient  normal vomiting. I have milligrams patient's diet to soft. Continue PPI.  *  CAD (coronary artery disease) - continue home meds  *  Hypothyroidism - home dose thyroid replacement  *  GERD (gastroesophageal reflux disease) - PPI as above  Spoke with Criss Alvine and give her an update.  Case discussed with Care Management/Social Worker. Management plans discussed with the patient, family and they are in agreement.  CODE STATUS: Full code  DVT Prophylaxis: SCD  TOTAL TIME TAKING CARE OF THIS PATIENT: *30* minutes.  >50% time spent on counselling and coordination of care  POSSIBLE D/C IN *1-2* DAYS, DEPENDING ON CLINICAL CONDITION.  Note: This dictation was prepared with Dragon dictation along with smaller phrase technology. Any transcriptional errors that result from this process are unintentional.  Shaylon Aden M.D on 02/14/2017 at 1:48 PM  Between 7am to 6pm - Pager - [phone]  After 6pm go to [email or web address] - password [email or web address] [facility]  Office  [phone]  CC: Primary care physician; [name], DO

## 2017-02-14 NOTE — NC FL2 (Signed)
[place] LEVEL OF CARE SCREENING TOOL     IDENTIFICATION  Patient Name: [name] [name] Birthdate: 09-21-1931 Sex: female Admission Date (Current Location): 02/12/2017  [place] and [place] Number:  Engineering geologist and Address:  [facility], [address], [place], [place] [address]      Provider Number: [id number]  Attending Physician Name and Address:  Fritzi Mandes, MD  Relative Name and Phone Number:       Current Level of Care: Hospital Recommended Level of Care:   Prior Approval Number:    Date Approved/Denied:   PASRR Number: [id number]  Discharge Plan: SNF    Current Diagnoses: Patient Active Problem List   Diagnosis Date Noted  . Coffee ground emesis 02/12/2017  . Acute on chronic renal failure ([place]) 01/13/2017  . Dementia, vascular 12/03/2016  . Stroke ([place]) 08/20/2016  . Peripheral vascular disease ([place]) 04/09/2016  . DKA (diabetic ketoacidoses) ([place]) 06/08/2015  . CKD stage 3 due to type 2 diabetes mellitus ([place]) 02/22/2014  . Hypothyroidism 09/23/2012  . GERD (gastroesophageal reflux disease) 09/23/2012  . Anxiety 07/26/2012  . Hyperlipidemia with target LDL less than 70 12/03/2011  . DM (diabetes mellitus) type II uncontrolled, periph vascular disorder ([place]) 04/03/2011  . CAD (coronary artery disease) 12/13/2009    Orientation RESPIRATION BLADDER Height & Weight     Self, Time, Situation, Place  Normal Continent Weight: 138 lb 12.8 oz (63 kg) Height:  5\' 6"  (167.6 cm)  BEHAVIORAL SYMPTOMS/MOOD NEUROLOGICAL BOWEL NUTRITION STATUS      Continent Diet (Heart Healthy/Carb modified)  AMBULATORY STATUS COMMUNICATION OF NEEDS Skin   Extensive Assist Verbally Surgical wounds                       Personal Care Assistance Level of Assistance  Bathing, Feeding, Dressing Bathing Assistance: Limited assistance Feeding assistance: Independent Dressing Assistance: Limited assistance     Functional  Limitations Info             SPECIAL CARE FACTORS FREQUENCY  PT (By licensed PT)     PT Frequency: Up to 5X per day, 5 days per week              Contractures      Additional Factors Info  Allergies   Allergies Info: Penicillins           Current Medications (02/14/2017):  This is the current hospital active medication list Current Facility-Administered Medications  Medication Dose Route Frequency Provider Last Rate Last Dose  . acetaminophen (TYLENOL) tablet 650 mg  650 mg Oral Q6H PRN Lance Coon, MD       Or  . acetaminophen (TYLENOL) suppository 650 mg  650 mg Rectal Q6H PRN Lance Coon, MD      . atorvastatin (LIPITOR) tablet 80 mg  80 mg Oral Daily Lance Coon, MD   80 mg at 02/13/17 2149  . insulin aspart (novoLOG) injection 0-9 Units  0-9 Units Subcutaneous TID AC & HS Lance Coon, MD   2 Units at 02/14/17 1228  . insulin aspart (novoLOG) injection 10 Units  10 Units Intravenous Once Lance Coon, MD      . insulin glargine (LANTUS) injection 22 Units  22 Units Subcutaneous QHS Lance Coon, MD   22 Units at 02/13/17 2148  . levothyroxine (SYNTHROID, LEVOTHROID) tablet 100 mcg  100 mcg Oral QAC breakfast Lance Coon, MD   100 mcg at 02/14/17 0819  . midodrine (PROAMATINE) tablet  10 mg  10 mg Oral TID WC Lance Coon, MD   10 mg at 02/13/17 4473  . ondansetron (ZOFRAN) tablet 4 mg  4 mg Oral Q6H PRN Lance Coon, MD       Or  . ondansetron [facility]) injection 4 mg  4 mg Intravenous Q6H PRN Lance Coon, MD   4 mg at 02/12/17 1700  . pantoprazole (PROTONIX) EC tablet 40 mg  40 mg Oral BID AC Fritzi Mandes, MD         Discharge Medications: Please see discharge summary for a list of discharge medications.  Relevant Imaging Results:  Relevant Lab Results:   Additional Information SS# [id number]  Zettie Pho, LCSW

## 2017-02-14 NOTE — Progress Notes (Signed)
Patient CBG 43, recheck 38. Patient alert. MD notified, D50 ordered and given. Continue to monitor.

## 2017-02-14 NOTE — Progress Notes (Signed)
Midodrine out of stock;not given today.

## 2017-02-14 NOTE — Consult Note (Signed)
Reason for Consult:Coffee ground emesis Referring Physician: Dr. Deborha Payment is an 83 y.o. female.  HPI: Seen in consultation at the request of Dr. Posey Pronto for further evaluation of coffee ground emesis. The history is obtained through my conversation with her bedside nurse and review of EPIC. I did speak to the patient, but given her confusion, the nurse confirmed that the details she provided were not accurate.   The patient was admitted with admitted with symptomatic hyperglycemia 02/12/17. Coffee ground streaks were noted in her emesis. She has had no further nausea or vomiting since admission. She is tolerating a clear liquid diet. Orders are in place for a diet advance today. No BM was been documented.  She has been on Plavix that was discontinued 02/13/17 and Lovenox that was discontinued 02/12/17.   She has been hemodynamically stable. Her hemoglobin on admission was 10.8. It then declined to 8.9. Today it is 9.0.   She denies any prior endoscopic evaluation. However, I've been able to find a colonoscopy report from Dr. Gustavo Lah 06/25/11 showing ischemic colitis and diverticulosis. She ultimately had a lap ileocolectomy for necrosis 07/01/11. I am unable to localize any other endoscopy. Her record also shows a diagnosis of IBS and GERD.   Past Medical History:  Diagnosis Date  . Anxiety state, unspecified   . CAD (coronary artery disease)   . Cancer [facility])    2004 Right breast, found on mammogram, radiation therapy, Dr. Bryson Ha, uterine cancer,   . COPD (chronic obstructive pulmonary disease) ([place])   . Diabetes mellitus   . Esophageal reflux   . Heart burn   . Hyperlipidemia   . IBS (irritable bowel syndrome)   . Myocardial infarction [facility])    Cath negative except for 40% occlusion LAD.  Pt not candidate for betablocker or ACEI because of hypotension  . OSA (obstructive sleep apnea)   . Osteoporosis 02/21/09   DEXA scan showed osteoporosis with left femur T-score -2.8.  Marland Kitchen  Presbyacusis   . Stroke ([place])   . Thyroid disease    Hypothyroid  . Vitamin D deficiency     Past Surgical History:  Procedure Laterality Date  . ABDOMINAL HYSTERECTOMY     uterine cancer  . BREAST SURGERY    . COLON SURGERY  2013   done at [facility]    Family History  Problem Relation Age of Onset  . Diabetes Mother   . Heart attack Father     Social History:  reports that she has never smoked. She has never used smokeless tobacco. She reports that she does not drink alcohol or use drugs.  Allergies:  Allergies  Allergen Reactions  . Penicillins Rash    Has patient had a PCN reaction causing immediate rash, facial/tongue/throat swelling, SOB or lightheadedness with hypotension: No Has patient had a PCN reaction causing severe rash involving mucus membranes or skin necrosis: No Has patient had a PCN reaction that required hospitalization No Has patient had a PCN reaction occurring within the last 10 years: No If all of the above answers are "NO", then may proceed with Cephalosporin use.     Medications:  I have reviewed the patient's current medications. Prior to Admission:  Prescriptions Prior to Admission  Medication Sig Dispense Refill Last Dose  . aspirin EC 81 MG tablet Take 1 tablet (81 mg total) by mouth daily. 90 tablet 3 02/12/2017 at am  . atorvastatin (LIPITOR) 80 MG tablet Take 1 tablet (80 mg total) by mouth daily.  90 tablet 3 02/12/2017 at am  . clopidogrel (PLAVIX) 75 MG tablet TAKE 1 TABLET(75 MG) BY MOUTH DAILY 90 tablet 3 02/12/2017 at am  . fluticasone (FLONASE) 50 MCG/ACT nasal spray Place 2 sprays into both nostrils daily. 16 g 6 prn at prn  . glucagon 1 MG injection Inject 1 mg into the vein once as needed.   prn at prn  . insulin glargine (LANTUS) 100 UNIT/ML injection Inject 22 Units into the skin at bedtime.    02/11/2017 at qhs  . insulin lispro (HUMALOG) 100 UNIT/ML injection Inject 6-10 Units into the skin 3 (three) times daily before meals. take 10  units at breakfast, 6 units at lunch, 10 units at supper.   02/12/2017 at am  . levothyroxine (SYNTHROID, LEVOTHROID) 100 MCG tablet TAKE 1 TABLET(100 MCG) BY MOUTH DAILY 90 tablet 3 02/12/2017 at am  . metFORMIN (GLUCOPHAGE) 500 MG tablet TAKE 1 TABLET(500 MG) BY MOUTH TWICE DAILY WITH A MEAL 180 tablet 1 02/12/2017 at am  . midodrine (PROAMATINE) 10 MG tablet Take 1 tablet (10 mg total) by mouth 3 (three) times daily with meals. 90 tablet 1 prn at prn  . pantoprazole (PROTONIX) 40 MG tablet Take 1 tablet (40 mg total) by mouth 2 (two) times daily. 180 tablet 3 02/12/2017 at am   Scheduled: . atorvastatin  80 mg Oral Daily  . insulin aspart  0-9 Units Subcutaneous TID AC & HS  . insulin aspart  10 Units Intravenous Once  . insulin glargine  22 Units Subcutaneous QHS  . levothyroxine  100 mcg Oral QAC breakfast  . midodrine  10 mg Oral TID WC  . pantoprazole  40 mg Oral BID AC   Continuous:  DDU:[id number] **OR** acetaminophen, ondansetron **OR** ondansetron (ZOFRAN) IV  Results for orders placed or performed during the hospital encounter of 02/12/17 (from the past 48 hour(s))  Beta-hydroxybutyric acid     Status: Abnormal   Collection Time: 02/12/17  2:44 PM  Result Value Ref Range   Beta-Hydroxybutyric Acid 5.38 (H) 0.05 - 0.27 mmol/L    Comment: RESULT CONFIRMED BY MANUAL DILUTION.  TFK  Blood gas, venous     Status: Abnormal (Preliminary result)   Collection Time: 02/12/17  2:44 PM  Result Value Ref Range   pH, Ven 7.27 7.250 - 7.430   pCO2, Ven 39 (L) 44.0 - 60.0 mmHg   pO2, Ven PENDING 32.0 - 45.0 mmHg   Bicarbonate 17.9 (L) 20.0 - 28.0 mmol/L   Acid-base deficit 8.4 (H) 0.0 - 2.0 mmol/L   O2 Saturation PENDING %   Patient temperature 37.0    Collection site VEIN    Sample type VEIN   Glucose, capillary     Status: Abnormal   Collection Time: 02/12/17  4:02 PM  Result Value Ref Range   Glucose-Capillary 430 (H) 65 - 99 mg/dL  Glucose, capillary     Status: Abnormal    Collection Time: 02/12/17  5:14 PM  Result Value Ref Range   Glucose-Capillary 358 (H) 65 - 99 mg/dL  Basic metabolic panel     Status: Abnormal   Collection Time: 02/12/17  5:56 PM  Result Value Ref Range   Sodium 138 135 - 145 mmol/L   Potassium 4.0 3.5 - 5.1 mmol/L   Chloride 108 101 - 111 mmol/L   CO2 17 (L) 22 - 32 mmol/L   Glucose, Bld 328 (H) 65 - 99 mg/dL   BUN 26 (H) 6 - 20 mg/dL  Creatinine, Ser 1.36 (H) 0.44 - 1.00 mg/dL   Calcium 8.8 (L) 8.9 - 10.3 mg/dL   GFR calc non Af Amer 35 (L) >60 mL/min   GFR calc Af Amer 41 (L) >60 mL/min    Comment: (NOTE) The eGFR has been calculated using the CKD EPI equation. This calculation has not been validated in all clinical situations. eGFR's persistently <60 mL/min signify possible Chronic Kidney Disease.    Anion gap 13 5 - 15  Glucose, capillary     Status: Abnormal   Collection Time: 02/12/17 10:14 PM  Result Value Ref Range   Glucose-Capillary 288 (H) 65 - 99 mg/dL  Glucose, capillary     Status: Abnormal   Collection Time: 02/12/17 10:54 PM  Result Value Ref Range   Glucose-Capillary 274 (H) 65 - 99 mg/dL   Comment 1 Notify RN   CBC with Differential/Platelet     Status: Abnormal   Collection Time: 02/12/17 11:15 PM  Result Value Ref Range   WBC 5.7 3.6 - 11.0 K/uL   RBC 2.81 (L) 3.80 - 5.20 MIL/uL   Hemoglobin 8.9 (L) 12.0 - 16.0 g/dL   HCT 26.8 (L) 35.0 - 47.0 %   MCV 95.4 80.0 - 100.0 fL   MCH 31.7 26.0 - 34.0 pg   MCHC 33.3 32.0 - 36.0 g/dL   RDW 15.0 (H) 11.5 - 14.5 %   Platelets 237 150 - 440 K/uL   Neutrophils Relative % 61 %   Neutro Abs 3.5 1.4 - 6.5 K/uL   Lymphocytes Relative 27 %   Lymphs Abs 1.5 1.0 - 3.6 K/uL   Monocytes Relative 11 %   Monocytes Absolute 0.6 0.2 - 0.9 K/uL   Eosinophils Relative 0 %   Eosinophils Absolute 0.0 0 - 0.7 K/uL   Basophils Relative 1 %   Basophils Absolute 0.0 0 - 0.1 K/uL  Comprehensive metabolic panel     Status: Abnormal   Collection Time: 02/12/17 11:15 PM   Result Value Ref Range   Sodium 138 135 - 145 mmol/L   Potassium 4.8 3.5 - 5.1 mmol/L   Chloride 107 101 - 111 mmol/L   CO2 22 22 - 32 mmol/L   Glucose, Bld 295 (H) 65 - 99 mg/dL   BUN 28 (H) 6 - 20 mg/dL   Creatinine, Ser 1.27 (H) 0.44 - 1.00 mg/dL   Calcium 9.1 8.9 - 10.3 mg/dL   Total Protein 6.4 (L) 6.5 - 8.1 g/dL   Albumin 3.4 (L) 3.5 - 5.0 g/dL   AST 30 15 - 41 U/L   ALT 26 14 - 54 U/L   Alkaline Phosphatase 113 38 - 126 U/L   Total Bilirubin 0.9 0.3 - 1.2 mg/dL   GFR calc non Af Amer 38 (L) >60 mL/min   GFR calc Af Amer 44 (L) >60 mL/min    Comment: (NOTE) The eGFR has been calculated using the CKD EPI equation. This calculation has not been validated in all clinical situations. eGFR's persistently <60 mL/min signify possible Chronic Kidney Disease.    Anion gap 9 5 - 15  Glucose, capillary     Status: Abnormal   Collection Time: 02/13/17  3:12 AM  Result Value Ref Range   Glucose-Capillary 204 (H) 65 - 99 mg/dL  Glucose, capillary     Status: Abnormal   Collection Time: 02/13/17  4:53 AM  Result Value Ref Range   Glucose-Capillary 172 (H) 65 - 99 mg/dL   Comment 1 Notify RN  Basic metabolic panel     Status: Abnormal   Collection Time: 02/13/17  5:02 AM  Result Value Ref Range   Sodium 141 135 - 145 mmol/L   Potassium 3.9 3.5 - 5.1 mmol/L   Chloride 111 101 - 111 mmol/L   CO2 24 22 - 32 mmol/L   Glucose, Bld 184 (H) 65 - 99 mg/dL   BUN 29 (H) 6 - 20 mg/dL   Creatinine, Ser 1.18 (H) 0.44 - 1.00 mg/dL   Calcium 8.9 8.9 - 10.3 mg/dL   GFR calc non Af Amer 42 (L) >60 mL/min   GFR calc Af Amer 48 (L) >60 mL/min    Comment: (NOTE) The eGFR has been calculated using the CKD EPI equation. This calculation has not been validated in all clinical situations. eGFR's persistently <60 mL/min signify possible Chronic Kidney Disease.    Anion gap 6 5 - 15  CBC     Status: Abnormal   Collection Time: 02/13/17  5:02 AM  Result Value Ref Range   WBC 5.5 3.6 - 11.0 K/uL    RBC 2.72 (L) 3.80 - 5.20 MIL/uL   Hemoglobin 9.0 (L) 12.0 - 16.0 g/dL   HCT 26.3 (L) 35.0 - 47.0 %   MCV 96.4 80.0 - 100.0 fL   MCH 33.2 26.0 - 34.0 pg   MCHC 34.4 32.0 - 36.0 g/dL   RDW 14.7 (H) 11.5 - 14.5 %   Platelets 210 150 - 440 K/uL  Glucose, capillary     Status: Abnormal   Collection Time: 02/13/17  7:45 AM  Result Value Ref Range   Glucose-Capillary 150 (H) 65 - 99 mg/dL   Comment 1 Notify RN    Comment 2 Document in Chart   Glucose, capillary     Status: Abnormal   Collection Time: 02/13/17 11:07 AM  Result Value Ref Range   Glucose-Capillary 118 (H) 65 - 99 mg/dL   Comment 1 Notify RN    Comment 2 Document in Chart   Glucose, capillary     Status: Abnormal   Collection Time: 02/13/17  4:38 PM  Result Value Ref Range   Glucose-Capillary 148 (H) 65 - 99 mg/dL  Glucose, capillary     Status: Abnormal   Collection Time: 02/13/17  6:36 PM  Result Value Ref Range   Glucose-Capillary 336 (H) 65 - 99 mg/dL  Glucose, capillary     Status: Abnormal   Collection Time: 02/13/17  9:25 PM  Result Value Ref Range   Glucose-Capillary 125 (H) 65 - 99 mg/dL  Glucose, capillary     Status: Abnormal   Collection Time: 02/14/17  4:38 AM  Result Value Ref Range   Glucose-Capillary 43 (LL) 65 - 99 mg/dL   Comment 1 Notify RN   Glucose, capillary     Status: Abnormal   Collection Time: 02/14/17  4:52 AM  Result Value Ref Range   Glucose-Capillary 38 (LL) 65 - 99 mg/dL   Comment 1 Call MD NNP PA CNM   Glucose, capillary     Status: Abnormal   Collection Time: 02/14/17  6:00 AM  Result Value Ref Range   Glucose-Capillary 155 (H) 65 - 99 mg/dL   Comment 1 Notify RN   Glucose, capillary     Status: Abnormal   Collection Time: 02/14/17  7:39 AM  Result Value Ref Range   Glucose-Capillary 124 (H) 65 - 99 mg/dL   Comment 1 Notify RN   Glucose, capillary     Status:  Abnormal   Collection Time: 02/14/17  9:20 AM  Result Value Ref Range   Glucose-Capillary 122 (H) 65 - 99 mg/dL   Glucose, capillary     Status: Abnormal   Collection Time: 02/14/17 12:06 PM  Result Value Ref Range   Glucose-Capillary 166 (H) 65 - 99 mg/dL   Comment 1 Notify RN     No results found.  Review of Systems  Unable to perform ROS: Mental status change   Blood pressure (!) 115/58, pulse (!) 55, temperature 97.7 F (36.5 C), temperature source Oral, resp. rate 18, height 5' 6"  (1.676 m), weight 138 lb 12.8 oz (63 kg), SpO2 100 %. Physical Exam  Constitutional: She appears well-developed and well-nourished. No distress.  Appears her stated age.  HENT:  Head: Normocephalic and atraumatic.  Mouth/Throat: No oropharyngeal exudate.  Eyes: Conjunctivae are normal. No scleral icterus.  Neck: Neck supple. No thyromegaly present.  Cardiovascular: Normal rate and regular rhythm.   Respiratory: Effort normal and breath sounds normal.  GI: Soft. Bowel sounds are normal. She exhibits no distension and no mass. There is no tenderness. There is no rebound and no guarding.  Musculoskeletal: Normal range of motion. She exhibits no edema.  Neurological: She is alert.  Confused.  Skin: Skin is warm and dry. No erythema.  Psychiatric: She has a normal mood and affect. Thought content normal.    Assessment/Plan: Coffee ground emesis noted in nausea and vomiting 02/12/17 Normocytic anemia, overall stable    - baseline hemoglobin 9.6-10.4 Acute on chronic renal failure Coronary artery disease Recent Lovenox and Plavix Hypothyroidism History of GERD    - H pylori negative 09/23/12 History of lap ileocolectomy for ischemic colitis 07/01/11 Diverticulosis on colonoscopy with Dr. Gustavo Lah 06/25/11  Symptoms have resolved. Normocytic anemia is close to baseline and is likely multifactorial. Recommend empiric treatment with po PPI x 8 weeks. Agree with diet advance today.  I think there is low yield to endoscopy at this time. Given her advanced age, I would only pursue endoscopy for ongoing overt or  occult GI blood loss that does not respond to iron supplements.   Thornton Park 02/14/2017, 1:35 PM

## 2017-02-14 NOTE — Progress Notes (Signed)
Physical Therapy Evaluation Patient Details Name: [name] [name] MRN: [id number] DOB: [birth year] Today's Date: 02/14/2017   History of Present Illness  Patient is an 83 y.o. female admitted on 12 Feb 2017 with altered mental status due to DKA. Was experiencing nausea/vomiting on 18 JUL. PMH includes CAD, hypothyroidism, GERD, and acute on chronic RF.  Clinical Impression  Patient is a pleasant female admitted for above listed reasons. Subjective information obtained from patient, patient's pastor (at bedside), and RN with patient demonstrating intermittent periods lucidly answering questions/following commands. On evaluation, patient demonstrates need for minimal-moderate assistance with bed mobility and transfers, lacking awareness of deficits. Patient previously modified independent with household ambulation with rollator, requiring assistance from son's girlfriend for grocery shopping/cooking. Because patient is not at believed previous level of function, she would benefit from skilled and progressive PT to improve strength, balance, and functional mobility with f/u at SNF upon d/c when medically ready.    Follow Up Recommendations SNF    Equipment Recommendations  Rolling walker with 5" wheels    Recommendations for Other Services       Precautions / Restrictions Precautions Precautions: Fall Restrictions Weight Bearing Restrictions: No      Mobility  Bed Mobility Overal bed mobility: Needs Assistance Bed Mobility: Supine to Sit;Sit to Supine     Supine to sit: Min assist Sit to supine: Mod assist   General bed mobility comments: Patient requires minimal-moderate assistance with bed mobility. Demonstrates posterior trunk lean upon initial supine to sit transfer.  Transfers Overall transfer level: Needs assistance Equipment used: Rolling walker (2 wheeled) Transfers: Sit to/from Stand Sit to Stand: Min assist         General transfer comment: Patient requires  minimal assistance and verbal cues for sequencing to perform sit to stand transfer. Keeps feet in NBOS and requires increased time to weightshift.  Ambulation/Gait Ambulation/Gait assistance: Min assist Ambulation Distance (Feet): 2 Feet Assistive device: Rolling walker (2 wheeled)       General Gait Details: Patient able to take two steps forwards/backwards. Keeps feet in NBOS, showing mild posterior lean, complaining of dizziness.  Stairs            Wheelchair Mobility    Modified Rankin (Stroke Patients Only)       Balance Overall balance assessment: Needs assistance Sitting-balance support: Feet supported;Single extremity supported Sitting balance-Leahy Scale: Fair     Standing balance support: Bilateral upper extremity supported Standing balance-Leahy Scale: Poor                               Pertinent Vitals/Pain Pain Assessment: No/denies pain    Home Living Family/patient expects to be discharged to:: Private residence Living Arrangements: Children Available Help at Discharge: Family;Available PRN/intermittently;Other (Comment) (Son who has physical impairments) Type of Home: House Home Access: Stairs to enter Entrance Stairs-Rails: Can reach both Entrance Stairs-Number of Steps: 3 Home Layout: One level Home Equipment: [employer] - 4 wheels;Shower seat      Prior Function Level of Independence: Needs assistance   Gait / Transfers Assistance Needed: Household distances with rollator  ADL's / Homemaking Assistance Needed: Performed by son's girlfriend per RN        Hand Dominance        Extremity/Trunk Assessment   Upper Extremity Assessment Upper Extremity Assessment: Generalized weakness    Lower Extremity Assessment Lower Extremity Assessment: Generalized weakness       Communication   Communication:  HOH  Cognition Arousal/Alertness: Lethargic Behavior During Therapy: Flat affect Overall Cognitive Status: Difficult to  assess                                 General Comments: Patient intermittently able to follow commands/answer questions accurately      General Comments      Exercises     Assessment/Plan    PT Assessment Patient needs continued PT services  PT Problem List Decreased strength;Decreased activity tolerance;Decreased balance;Decreased mobility;Decreased cognition;Decreased knowledge of use of DME;Decreased safety awareness       PT Treatment Interventions DME instruction;Gait training;Stair training;Functional mobility training;Therapeutic activities;Therapeutic exercise;Cognitive remediation;Patient/family education    PT Goals (Current goals can be found in the Care Plan section)  Acute Rehab PT Goals Patient Stated Goal: Unstated PT Goal Formulation: With patient Time For Goal Achievement: 02/28/17 Potential to Achieve Goals: Good    Frequency Min 2X/week   Barriers to discharge Decreased caregiver support;Inaccessible home environment      Co-evaluation               AM-PAC PT "6 Clicks" Daily Activity  Outcome Measure Difficulty turning over in bed (including adjusting bedclothes, sheets and blankets)?: A Little Difficulty moving from lying on back to sitting on the side of the bed? : A Little Difficulty sitting down on and standing up from a chair with arms (e.g., wheelchair, bedside commode, etc,.)?: A Little Help needed moving to and from a bed to chair (including a wheelchair)?: A Lot Help needed walking in hospital room?: Total Help needed climbing 3-5 steps with a railing? : Total 6 Click Score: 13    End of Session Equipment Utilized During Treatment: Gait belt Activity Tolerance: Patient tolerated treatment well;Patient limited by fatigue Patient left: in bed;with call bell/phone within reach;with bed alarm set;with family/visitor present Nurse Communication: Mobility status PT Visit Diagnosis: Unsteadiness on feet (R26.81);Muscle  weakness (generalized) (M62.81);Difficulty in walking, not elsewhere classified (R26.2)    Time: 0017-4944 PT Time Calculation (min) (ACUTE ONLY): 18 min   Charges:   PT Evaluation $PT Eval Low Complexity: 1 Procedure     PT G Codes:          Dorice Lamas, PT, DPT 02/14/2017, 9:50 AM

## 2017-02-15 DIAGNOSIS — R112 Nausea with vomiting, unspecified: Secondary | ICD-10-CM

## 2017-02-15 DIAGNOSIS — F4323 Adjustment disorder with mixed anxiety and depressed mood: Secondary | ICD-10-CM

## 2017-02-15 LAB — GLUCOSE, CAPILLARY
GLUCOSE-CAPILLARY: 172 mg/dL — AB (ref 65–99)
GLUCOSE-CAPILLARY: 143 mg/dL — AB (ref 65–99)
GLUCOSE-CAPILLARY: 125 mg/dL — AB (ref 65–99)
Glucose-Capillary: 35 mg/dL — CL (ref 65–99)
Glucose-Capillary: 287 mg/dL — ABNORMAL HIGH (ref 65–99)
Glucose-Capillary: 262 mg/dL — ABNORMAL HIGH (ref 65–99)

## 2017-02-15 LAB — GLUCOSE, RANDOM: GLUCOSE: 199 mg/dL — AB (ref 65–99)

## 2017-02-15 MED ORDER — DEXTROSE 50 % IV SOLN
1.0000 | Freq: Once | INTRAVENOUS | Status: AC
  Administered 2017-02-15: 50 mL via INTRAVENOUS
  Filled 2017-02-15: qty 50

## 2017-02-15 MED ORDER — SERTRALINE HCL 50 MG PO TABS
25.0000 mg | ORAL_TABLET | Freq: Every day | ORAL | Status: DC
Start: 2017-02-16 — End: 2017-02-16
  Administered 2017-02-16: 25 mg via ORAL
  Filled 2017-02-15: qty 1

## 2017-02-15 MED ORDER — GLUCOSE 4 G PO CHEW
CHEWABLE_TABLET | ORAL | Status: AC
  Filled 2017-02-15: qty 1

## 2017-02-15 MED ORDER — GLUCOSE 4 G PO CHEW
1.0000 | CHEWABLE_TABLET | Freq: Once | ORAL | Status: AC
  Administered 2017-02-15: 4 g via ORAL

## 2017-02-15 MED ORDER — INSULIN GLARGINE 100 UNIT/ML ~~LOC~~ SOLN
22.0000 [IU] | Freq: Every day | SUBCUTANEOUS | Status: DC
  Administered 2017-02-16: 22 [IU] via SUBCUTANEOUS
  Filled 2017-02-15 (×2): qty 0.22

## 2017-02-15 NOTE — Clinical Social Work Placement (Signed)
   CLINICAL SOCIAL WORK PLACEMENT  NOTE  Date:  02/15/2017  Patient Details  Name: [name] [name] MRN: [id number] Date of Birth: 03-Apr-1932  Clinical Social Work is seeking post-discharge placement for this patient at the [place] level of care (*CSW will initial, date and re-position this form in  chart as items are completed):  Yes   Patient/family provided with [place] Work Department's list of facilities offering this level of care within the geographic area requested by the patient (or if unable, by the patient's family).  Yes   Patient/family informed of their freedom to choose among providers that offer the needed level of care, that participate in Medicare, Medicaid or managed care program needed by the patient, have an available bed and are willing to accept the patient.  Yes   Patient/family informed of [facility]'s ownership interest in [facility] and [facility], as well as of the fact that they are under no obligation to receive care at these facilities.  PASRR submitted to EDS on       PASRR number received on       Existing PASRR number confirmed on 02/15/17     FL2 transmitted to all facilities in geographic area requested by pt/family on 02/15/17     FL2 transmitted to all facilities within larger geographic area on       Patient informed that his/her managed care company has contracts with or will negotiate with certain facilities, including the following:            Patient/family informed of bed offers received.  Patient [facility] bed [facility]     Physician recommends and patient chooses bed at [facility]    Patient to be transferred to [facility] on  .  Patient to be transferred to facility by       Patient family notified on   of transfer.  Name of family member notified:        PHYSICIAN       Additional Comment:    _______________________________________________ Zettie Pho,  LCSW 02/15/2017, 4:24 PM

## 2017-02-15 NOTE — Progress Notes (Signed)
Patient has hearing aids in place at the end of this nurse's shift.

## 2017-02-15 NOTE — Clinical Social Work Note (Signed)
Clinical Social Work Assessment  Patient Details  Name: [name] [name] MRN: [id number] Date of Birth: 21-Aug-1931  Date of referral:  02/15/17               Reason for consult:  Facility Placement                Permission sought to share information with:  Chartered certified accountant granted to share information::  Yes, Verbal Permission Granted  Name::        Agency::     Relationship::     Contact Information:     Housing/Transportation Living arrangements for the past 2 months:  Single Family Home Source of Information:  Patient, Adult Children Patient Interpreter Needed:  None Criminal Activity/Legal Involvement Pertinent to Current Situation/Hospitalization:  No - Comment as needed Significant Relationships:  Adult Children Lives with:  Adult Children Do you feel safe going back to the place where you live?  Yes Need for family participation in patient care:  No (Coment)  Care giving concerns:  PT recommendation for SNF   Social Worker assessment / plan:  CSW met with the patient at bedside to discuss PT recommendation for SNF. The patient presented as lethargic with a flat affect. The patient indicated that the CSW should contact [name] [name] or [name] [name]. The CSW conferred with the attending RN who reported that the patient has a HCPOA, her daughter [name] [name]. The CSW contacted [name] [name] to discuss discharge planning. The CSW explained the difference between SNF and ALF, STR and LTC. The CSW explained how insurance works with regards to SNF. The patient's HCPOA chose [facility] from available bed offers. The patient will need transportation at time of discharge.  While discussing the discharge plan, the HCPOA gave information that leads the CSW to believe that the patient may have s/s of depression such as lethargy, hopelessness, loss of interest in pleasurable activities, irritability, and little motivation to engage in wellness activities. Due to these s/s, the CSW  recommends a psych consult for possible depression and medication needs for such to assist with a more positive long-term outcome and to minimize risk of rehospitalization due to medical treatment non-compliance.  Employment status:  Retired Nurse, adult PT Recommendations:  [place] / Referral to community resources:  [place]  Patient/Family's Response to care:  The patient's daughter thanked the CSW for assistance.  Patient/Family's Understanding of and Emotional Response to Diagnosis, Current Treatment, and Prognosis:  The patient agrees with the plan as does the HCPOA.  Emotional Assessment Appearance:  Appears stated age Attitude/Demeanor/Rapport:  Lethargic Affect (typically observed):  Flat Orientation:  Oriented to Self, Oriented to Situation Alcohol / Substance use:  Never Used Psych involvement (Current and /or in the community):  Yes (Comment) (CSW recommendation for psych consult)  Discharge Needs  Concerns to be addressed:  Care Coordination, Discharge Planning Concerns, Coping/Stress Concerns, Mental Health Concerns Readmission within the last 30 days:  No Current discharge risk:  Chronically ill Barriers to Discharge:  Continued Medical Work up   [employer], LCSW 02/15/2017, 4:19 PM

## 2017-02-15 NOTE — Progress Notes (Addendum)
Per request of social worker Greig Right,  this nurse spoke to Dr. Fritzi Mandes and requested psych consult. Nurse ordered per Dr. Posey Pronto.

## 2017-02-15 NOTE — Consult Note (Addendum)
[facility] Face-to-Face Psychiatry Consult   Reason for Consult:  Depressopm Referring Physician:  Fritzi Mandes, MD Patient Identification: [name] [name] MRN:  [id number] Principal Diagnosis: DKA (diabetic ketoacidoses) [facility])   Subjective:    [name] [name] is an 83 year old widowed Caucasian female with no past psychiatric history who is admitted to the medicine service with diabetic ketoacidosis and acute on chronic renal failure. Psychiatry was consult visit today for depression by the hospitalist service. The patient herself is denying any current depressive symptoms but is talking with patient and cognitive decline. [name] [name] was alert and oriented to place and situation but gave the year as being 1980. His trunk was the President of the [place] and gave the date is July 22 but still insisted the year was 75. [name] [name] was not endorsing any active psychotic symptoms including auditory or visual hallucinations. No paranoid thoughts or delusions. [name] [name] denies any history of symptoms consistent with bipolar mania. The patient is adamantly denying any current feelings of hopelessness, anhedonia or crying spells. [name] [name] denies any current active or passive suicidal thoughts or psychotic symptoms. [name] [name] does however admit that [name] [name] has struggled to manage [name] [name] diabetes and does endorse some anxiety surrounding managing [name] [name] blood sugar. [name] [name] denies any major panic attacks.. Says [name] [name] is sleeping fairly well and denies any appetite.  While the patient has denied any depressive symptoms, the patient's son reports [name] [name] has had a chronic mild low level depression for many years which worsened when [name] [name] was forced into retirement at the age of 48. [name] [name] has lost two of [name] [name] six children. One child died in a car accident and one died of cancer which was hard on [name] [name], per [name] [name] son.  Past Psychiatric History The patient denies ever seeing a psychiatrist or therapist in the past. [name] [name] denies being on any psychotropic medications in the past for  depression or anxiety. [name] [name] denies any R inpatient psychiatric hospitalizations or suicide attempts.  Family Psychiatric History The patient's son has a history of depression. There is a history of substance abuse in multiple family members per [name] [name] son.  Social History The patient has been married 3 times today but is now widowed. [name] [name] has a 12th grade education and worked in the past in [employer]. [name] [name] says [name] [name] had 6 children total. Two of [name] [name] children have passed away. [name] [name] currently lives with [name] [name] oldest son. [name] [name] has a caretaker at home M-F.  Substance abuse history The patient denies any history of any heavy alcohol use or illicit drug use..     Diagnosis:   Patient Active Problem List   Diagnosis Date Noted  . Coffee ground emesis [K92.0] 02/12/2017  . Acute on chronic renal failure ([place]) [N17.9, N18.9] 01/13/2017  . Dementia, vascular [F01.50] 12/03/2016  . Stroke ([place]) [I63.9] 08/20/2016  . Peripheral vascular disease ([place]) [I73.9] 04/09/2016  . DKA (diabetic ketoacidoses) ([place]) [E13.10] 06/08/2015  . CKD stage 3 due to type 2 diabetes mellitus ([place]) [O70.78, N18.3] 02/22/2014  . Hypothyroidism [E03.9] 09/23/2012  . GERD (gastroesophageal reflux disease) [K21.9] 09/23/2012  . Anxiety [F41.9] 07/26/2012  . Hyperlipidemia with target LDL less than 70 [E78.5] 12/03/2011  . DM (diabetes mellitus) type II uncontrolled, periph vascular disorder ([place]) [E11.51, E11.65] 04/03/2011  . CAD (coronary artery disease) [I25.10] 12/13/2009    Total Time spent with patient: 45 minutes   Risk to Self: Is patient at risk for suicide?: No Risk to Others:  No Prior Inpatient Therapy:  No Prior Outpatient Therapy:  No  Past Medical History:  Past Medical History:  Diagnosis Date  . Anxiety state, unspecified   . CAD (coronary artery disease)   . Cancer [facility])    2004 Right breast, found on mammogram, radiation therapy, Dr. Bryson Ha, uterine cancer,   . COPD (chronic obstructive  pulmonary disease) ([place])   . Diabetes mellitus   . Esophageal reflux   . Heart burn   . Hyperlipidemia   . IBS (irritable bowel syndrome)   . Myocardial infarction [facility])    Cath negative except for 40% occlusion LAD.  Pt not candidate for betablocker or ACEI because of hypotension  . OSA (obstructive sleep apnea)   . Osteoporosis 02/21/09   DEXA scan showed osteoporosis with left femur T-score -2.8.  Marland Kitchen Presbyacusis   . Stroke ([place])   . Thyroid disease    Hypothyroid  . Vitamin D deficiency     Past Surgical History:  Procedure Laterality Date  . ABDOMINAL HYSTERECTOMY     uterine cancer  . BREAST SURGERY    . COLON SURGERY  2013   done at [facility]   Family History:  Family History  Problem Relation Age of Onset  . Diabetes Mother   . Heart attack Father    Social History:  History  Alcohol Use No     History  Drug Use No    Social History   Social History  . Marital status: Single    Spouse name: N/A  . Number of children: N/A  . Years of education: N/A   Occupational History  . retired    Social History Main Topics  . Smoking status: Never Smoker  . Smokeless tobacco: Never Used  . Alcohol use No  . Drug use: No  . Sexual activity: Not Asked   Other Topics Concern  . None   Social History Narrative  . None    Allergies:   Allergies  Allergen Reactions  . Penicillins Rash    Has patient had a PCN reaction causing immediate rash, facial/tongue/throat swelling, SOB or lightheadedness with hypotension: No Has patient had a PCN reaction causing severe rash involving mucus membranes or skin necrosis: No Has patient had a PCN reaction that required hospitalization No Has patient had a PCN reaction occurring within the last 10 years: No If all of the above answers are "NO", then may proceed with Cephalosporin use.     Labs:  Results for orders placed or performed during the hospital encounter of 02/12/17 (from the past 48 hour(s))  Glucose, capillary      Status: Abnormal   Collection Time: 02/13/17  6:36 PM  Result Value Ref Range   Glucose-Capillary 336 (H) 65 - 99 mg/dL  Glucose, capillary     Status: Abnormal   Collection Time: 02/13/17  9:25 PM  Result Value Ref Range   Glucose-Capillary 125 (H) 65 - 99 mg/dL  Glucose, capillary     Status: Abnormal   Collection Time: 02/14/17  4:38 AM  Result Value Ref Range   Glucose-Capillary 43 (LL) 65 - 99 mg/dL   Comment 1 Notify RN   Glucose, capillary     Status: Abnormal   Collection Time: 02/14/17  4:52 AM  Result Value Ref Range   Glucose-Capillary 38 (LL) 65 - 99 mg/dL   Comment 1 Call MD NNP PA CNM   Glucose, capillary     Status: Abnormal   Collection Time: 02/14/17  6:00 AM  Result Value Ref Range   Glucose-Capillary 155 (H) 65 -  99 mg/dL   Comment 1 Notify RN   Glucose, capillary     Status: Abnormal   Collection Time: 02/14/17  7:39 AM  Result Value Ref Range   Glucose-Capillary 124 (H) 65 - 99 mg/dL   Comment 1 Notify RN   Glucose, capillary     Status: Abnormal   Collection Time: 02/14/17  9:20 AM  Result Value Ref Range   Glucose-Capillary 122 (H) 65 - 99 mg/dL  Glucose, capillary     Status: Abnormal   Collection Time: 02/14/17 12:06 PM  Result Value Ref Range   Glucose-Capillary 166 (H) 65 - 99 mg/dL   Comment 1 Notify RN   Glucose, capillary     Status: Abnormal   Collection Time: 02/14/17  5:10 PM  Result Value Ref Range   Glucose-Capillary 320 (H) 65 - 99 mg/dL   Comment 1 Notify RN   Glucose, capillary     Status: Abnormal   Collection Time: 02/14/17  9:51 PM  Result Value Ref Range   Glucose-Capillary 253 (H) 65 - 99 mg/dL  Glucose, capillary     Status: Abnormal   Collection Time: 02/15/17  4:06 AM  Result Value Ref Range   Glucose-Capillary 35 (LL) 65 - 99 mg/dL  Glucose, random     Status: Abnormal   Collection Time: 02/15/17  4:49 AM  Result Value Ref Range   Glucose, Bld 199 (H) 65 - 99 mg/dL  Glucose, capillary     Status: Abnormal    Collection Time: 02/15/17  6:32 AM  Result Value Ref Range   Glucose-Capillary 172 (H) 65 - 99 mg/dL  Glucose, capillary     Status: Abnormal   Collection Time: 02/15/17  7:30 AM  Result Value Ref Range   Glucose-Capillary 143 (H) 65 - 99 mg/dL   Comment 1 Notify RN   Glucose, capillary     Status: Abnormal   Collection Time: 02/15/17 11:37 AM  Result Value Ref Range   Glucose-Capillary 287 (H) 65 - 99 mg/dL   Comment 1 Notify RN   Glucose, capillary     Status: Abnormal   Collection Time: 02/15/17  4:19 PM  Result Value Ref Range   Glucose-Capillary 262 (H) 65 - 99 mg/dL   Comment 1 Notify RN     Current Facility-Administered Medications  Medication Dose Route Frequency Provider Last Rate Last Dose  . acetaminophen (TYLENOL) tablet 650 mg  650 mg Oral Q6H PRN Lance Coon, MD   650 mg at 02/15/17 1749   Or  . acetaminophen (TYLENOL) suppository 650 mg  650 mg Rectal Q6H PRN Lance Coon, MD      . atorvastatin (LIPITOR) tablet 80 mg  80 mg Oral Daily Lance Coon, MD   80 mg at 02/14/17 2211  . insulin aspart (novoLOG) injection 0-9 Units  0-9 Units Subcutaneous TID AC & HS Lance Coon, MD   5 Units at 02/15/17 1658  . insulin aspart (novoLOG) injection 10 Units  10 Units Intravenous Once Lance Coon, MD      . Derrill Memo ON 02/16/2017] insulin glargine (LANTUS) injection 22 Units  22 Units Subcutaneous Daily Fritzi Mandes, MD      . levothyroxine (SYNTHROID, LEVOTHROID) tablet 100 mcg  100 mcg Oral QAC breakfast Lance Coon, MD   100 mcg at 02/15/17 0756  . midodrine (PROAMATINE) tablet 10 mg  10 mg Oral TID WC Lance Coon, MD   10 mg at 02/15/17 1653  . ondansetron (ZOFRAN) tablet 4 mg  4 mg Oral Q6H PRN Lance Coon, MD       Or  . ondansetron [facility]) injection 4 mg  4 mg Intravenous Q6H PRN Lance Coon, MD   4 mg at 02/12/17 1700  . pantoprazole (PROTONIX) EC tablet 40 mg  40 mg Oral BID AC Fritzi Mandes, MD   40 mg at 02/15/17 1658  . [START ON 02/16/2017] sertraline  (ZOLOFT) tablet 25 mg  25 mg Oral QPC breakfast Chauncey Mann, MD        Musculoskeletal: Strength & Muscle Tone: decreased Gait & Station: unsteady Patient leans: N/A  Psychiatric Specialty Exam: Physical Exam: Please see hospitalist note  ROS: Please see hospitalist note  Blood pressure 112/60, pulse 90, temperature 98.3 F (36.8 C), temperature source Oral, resp. rate 18, height 5\' 6"  (1.676 m), weight 63 kg (138 lb 12.8 oz), SpO2 98 %.Body mass index is 22.4 kg/m.  General Appearance: Casual  Eye Contact:  Fair  Speech:  Slow  Volume:  Decreased  Mood:  "OK"  Affect:  Flat  Thought Process:  Coherent and Linear  Orientation:  Other:  To place and situation but not fully to time. Gave year as 69  Thought Content:  Logical  Suicidal Thoughts:  No  Homicidal Thoughts:  No  Memory:  Immediate;   Poor Recent;   Poor Remote;   Poor  Judgement:  Fair  Insight:  Lacking  Psychomotor Activity:  Decreased  Concentration:  Concentration: Poor and Attention Span: Poor  Recall:  Poor  Fund of Knowledge:  Poor  Language:  Fair  Akathisia:  No  Handed:  Right  AIMS (if indicated):     Assets:  Chief Executive Officer Social Support  ADL's:  Intact  Cognition:  Impaired,  Mild  Sleep:        Treatment Plan Summary:  Adjustment Disorder with mild anxiety and depression R/O Dysthymia R/O Pseudodementia Delirium R/O Underlying Dementia   [name] [name] is an 83 year old widowed Caucasian female with no prior psychiatric diagnosis who was admitted to the medicine service with acute on chronic renal failure and diabetic ketoacidosis. [name] [name] currently is expressing some altered mental status and confusion. Psychiatry was consulted due to the suspicion of depression. Adjustment Disorder with mild anxiety and depression. R/O Dysthymia R/O Pseudodementia: Several of [name] [name] children have reported that [name] [name] is struggling with depression. [name] [name] is denying any suicidal thoughts and is  minimizing depressive symptoms. Will plan to start Zoloft 25mg  po daily for depression and anxiety. Will need Pseudodementia. Will check W23 and Folic acid and TSH.  Delirium R/O Dementia: The patient most likely has some cognitive decline but due to AMS from acute renal failure, [name] [name] should be evaluated properly once ARF has fully resolved. [name] [name] has multiple contributing factors for delirium Per [name] [name] daughter [name] [name] has seen a neurologist but says that Dr Melrose Nakayama did not diagnose [name] [name] with Dementia. CT Head however showed chronic ischemic changes.   I would recommend that [name] [name] follow-up with a psychiatrist after discharge for psychotropic medication management. Please try to contact Dr. Casimiro Needle, geriatric psychiatric in [facility] for an appotinemtnt or [facility] in [place] for an appointment. At this time, [name] [name] is not in imminent danger to herself or others necessitating inpatient psychiatric hospitalization.    Daily contact with patient to assess and evaluate symptoms and progress in treatment and Medication management  Disposition: No evidence of imminent risk to self or others at present.    Jay Schlichter, MD 02/15/2017 6:00  PM

## 2017-02-15 NOTE — Progress Notes (Signed)
Per Kenney Houseman remove Criss Alvine from emergency contact.

## 2017-02-15 NOTE — Clinical Social Work Note (Signed)
CSW attempted to contact the patient's HCPOA/daughter [facility]. CSW left a HIPPA compliant voice message and is waiting for a return call. Assessment to follow.  Santiago Bumpers, MSW, Latanya Presser [phone]

## 2017-02-15 NOTE — Progress Notes (Signed)
Subjective: Since I last evaluated the patient she has continued to do well. She has had no further nausea or vomiting. She is tolerating a regular diet without difficulty. GI ROS is otherwise negative.   Objective: Vital signs in last 24 hours: Temp:  [98.1 F (36.7 C)-98.7 F (37.1 C)] 98.3 F (36.8 C) (07/22 1340) Pulse Rate:  [64-90] 90 (07/22 1340) Resp:  [17-18] 18 (07/22 1340) BP: (110-134)/(58-64) 112/60 (07/22 1340) SpO2:  [98 %] 98 % (07/22 1340) Last BM Date: 02/12/17  Intake/Output from previous day: 07/21 0701 - 07/22 0700 In: 240 [P.O.:240] Out: 100 [Urine:100] Intake/Output this shift: No intake/output data recorded.  General appearance: alert, cooperative and appears stated age Resp: clear to auscultation bilaterally Cardio: regular rate and rhythm, S1, S2 normal, no murmur, click, rub or gallop GI: soft, non-tender; bowel sounds normal; no masses,  no organomegaly  Lab Results:  Recent Labs  02/12/17 2315 02/13/17 0502  WBC 5.7 5.5  HGB 8.9* 9.0*  HCT 26.8* 26.3*  PLT 237 210   BMET  Recent Labs  02/12/17 2315 02/13/17 0502 02/15/17 0449  NA 138 141  --   K 4.8 3.9  --   CL 107 111  --   CO2 22 24  --   GLUCOSE 295* 184* 199*  BUN 28* 29*  --   CREATININE 1.27* 1.18*  --   CALCIUM 9.1 8.9  --    LFT  Recent Labs  02/12/17 2315  PROT 6.4*  ALBUMIN 3.4*  AST 30  ALT 26  ALKPHOS 113  BILITOT 0.9   PT/INR No results for input(s): LABPROT, INR in the last 72 hours. Hepatitis Panel No results for input(s): HEPBSAG, HCVAB, HEPAIGM, HEPBIGM in the last 72 hours. C-Diff No results for input(s): CDIFFTOX in the last 72 hours. No results for input(s): CDIFFPCR in the last 72 hours. Fecal Lactopherrin No results for input(s): FECLLACTOFRN in the last 72 hours.  Studies/Results: No results found.  Medications:  I have reviewed the patient's current medications. Prior to Admission:  Prescriptions Prior to Admission  Medication Sig  Dispense Refill Last Dose  . aspirin EC 81 MG tablet Take 1 tablet (81 mg total) by mouth daily. 90 tablet 3 02/12/2017 at am  . atorvastatin (LIPITOR) 80 MG tablet Take 1 tablet (80 mg total) by mouth daily. 90 tablet 3 02/12/2017 at am  . clopidogrel (PLAVIX) 75 MG tablet TAKE 1 TABLET(75 MG) BY MOUTH DAILY 90 tablet 3 02/12/2017 at am  . fluticasone (FLONASE) 50 MCG/ACT nasal spray Place 2 sprays into both nostrils daily. 16 g 6 prn at prn  . glucagon 1 MG injection Inject 1 mg into the vein once as needed.   prn at prn  . insulin glargine (LANTUS) 100 UNIT/ML injection Inject 22 Units into the skin at bedtime.    02/11/2017 at qhs  . insulin lispro (HUMALOG) 100 UNIT/ML injection Inject 6-10 Units into the skin 3 (three) times daily before meals. take 10 units at breakfast, 6 units at lunch, 10 units at supper.   02/12/2017 at am  . levothyroxine (SYNTHROID, LEVOTHROID) 100 MCG tablet TAKE 1 TABLET(100 MCG) BY MOUTH DAILY 90 tablet 3 02/12/2017 at am  . metFORMIN (GLUCOPHAGE) 500 MG tablet TAKE 1 TABLET(500 MG) BY MOUTH TWICE DAILY WITH A MEAL 180 tablet 1 02/12/2017 at am  . midodrine (PROAMATINE) 10 MG tablet Take 1 tablet (10 mg total) by mouth 3 (three) times daily with meals. 90 tablet 1 prn at  prn  . pantoprazole (PROTONIX) 40 MG tablet Take 1 tablet (40 mg total) by mouth 2 (two) times daily. 180 tablet 3 02/12/2017 at am   Scheduled: . atorvastatin  80 mg Oral Daily  . insulin aspart  0-9 Units Subcutaneous TID AC & HS  . insulin aspart  10 Units Intravenous Once  . [START ON 02/16/2017] insulin glargine  22 Units Subcutaneous Daily  . levothyroxine  100 mcg Oral QAC breakfast  . midodrine  10 mg Oral TID WC  . pantoprazole  40 mg Oral BID AC  . [START ON 02/16/2017] sertraline  25 mg Oral QPC breakfast   Continuous:  ELY:[id number] **OR** acetaminophen, ondansetron **OR** ondansetron (ZOFRAN) IV  Assessment/Plan: Coffee ground emesis noted in nausea and vomiting 02/12/17 Normocytic  anemia, overall stable    - baseline hemoglobin 9.6-10.4 Acute on chronic renal failure Coronary artery disease Recent Lovenox and Plavix Hypothyroidism History of GERD    - H pylori negative 09/23/12 History of lap ileocolectomy for ischemic colitis 07/01/11 Diverticulosis on colonoscopy with Dr. Gustavo Lah 06/25/11  Symptoms have resolved. No hemoglobin today. Normocytic anemia is close to baseline and is likely multifactorial.  PPI x 8 weeks recommended. No endoscopy planned at this time.  I will move to standby. Please call the on-call gastroenterologist with any additional questions or concerns during this hospitalization.    LOS: 3 days   [place] 02/15/2017, 7:35 PM

## 2017-02-15 NOTE — Progress Notes (Signed)
[name] [name] NAME: [name] [name]    MR#:  [id number]  DATE OF BIRTH:  11/21/1931  SUBJECTIVE:   Answered basic questions appropriately Hungry. No more vomiting tolerating soft diet  REVIEW OF SYSTEMS:   Review of Systems  Constitutional: Negative for chills, fever and weight loss.  HENT: Negative for ear discharge, ear pain and nosebleeds.   Eyes: Negative for blurred vision, pain and discharge.  Respiratory: Negative for sputum production, shortness of breath, wheezing and stridor.   Cardiovascular: Negative for chest pain, palpitations, orthopnea and PND.  Gastrointestinal: Negative for abdominal pain, diarrhea, nausea and vomiting.  Genitourinary: Negative for frequency and urgency.  Musculoskeletal: Negative for back pain and joint pain.  Neurological: Negative for sensory change, speech change, focal weakness and weakness.  Psychiatric/Behavioral: Negative for depression and hallucinations. The patient is not nervous/anxious.    Tolerating Diet:FLD Tolerating PT: SNF  DRUG ALLERGIES:   Allergies  Allergen Reactions  . Penicillins Rash    Has patient had a PCN reaction causing immediate rash, facial/tongue/throat swelling, SOB or lightheadedness with hypotension: No Has patient had a PCN reaction causing severe rash involving mucus membranes or skin necrosis: No Has patient had a PCN reaction that required hospitalization No Has patient had a PCN reaction occurring within the last 10 years: No If all of the above answers are "NO", then may proceed with Cephalosporin use.     VITALS:  Blood pressure 112/60, pulse 90, temperature 98.3 F (36.8 C), temperature source Oral, resp. rate 18, height 5\' 6"  (1.676 m), weight 63 kg (138 lb 12.8 oz), SpO2 98 %.  PHYSICAL EXAMINATION:   Physical Exam  GENERAL:  83 y.o.-year-old patient lying in the bed with no acute distress.  EYES: Pupils equal, round, reactive to  light and accommodation. No scleral icterus. Extraocular muscles intact.  HEENT: Head atraumatic, normocephalic. Oropharynx and nasopharynx clear.  NECK:  Supple, no jugular venous distention. No thyroid enlargement, no tenderness.  LUNGS: Normal breath sounds bilaterally, no wheezing, rales, rhonchi. No use of accessory muscles of respiration.  CARDIOVASCULAR: S1, S2 normal. No murmurs, rubs, or gallops.  ABDOMEN: Soft, nontender, nondistended. Bowel sounds present. No organomegaly or mass.  EXTREMITIES: No cyanosis, clubbing or edema b/l.    NEUROLOGIC: Cranial nerves II through XII are intact. No focal Motor or sensory deficits b/l.   PSYCHIATRIC:  patient is alert and awake SKIN: No obvious rash, lesion, or ulcer.   LABORATORY PANEL:  CBC  Recent Labs Lab 02/13/17 0502  WBC 5.5  HGB 9.0*  HCT 26.3*  PLT 210    Chemistries   Recent Labs Lab 02/12/17 2315 02/13/17 0502 02/15/17 0449  NA 138 141  --   K 4.8 3.9  --   CL 107 111  --   CO2 22 24  --   GLUCOSE 295* 184* 199*  BUN 28* 29*  --   CREATININE 1.27* 1.18*  --   CALCIUM 9.1 8.9  --   AST 30  --   --   ALT 26  --   --   ALKPHOS 113  --   --   BILITOT 0.9  --   --    Cardiac Enzymes  Recent Labs Lab 02/12/17 1132  TROPONINI <0.03   RADIOLOGY:  No results found. ASSESSMENT AND PLAN:  [name] [name]  is a 83 y.o. female who presents with Altered mental status. Patient states that she began having nausea and  vomiting yesterday. She began feeling worse. She had an episode of black emesis with coffee ground material in it. She came to the ED today and was found to be in mild DKA.   * DKA (diabetic ketoacidoses) ([place]) - patient's anion gap is elevated, but just barely.  -now off IV insulin drip  -resumed home dose insulin  *  Acute on chronic renal failure (HCC) - related to her DKA and dehydration -recieved IV fluids  - avoid nephrotoxins   *  Coffee ground emesis - Received IV PPI -GI consult  pending---it did not get called in the ICU -Hemoglobin stable. Patient normal vomiting. I have milligrams patient's diet to soft. Continue PPI.  *  CAD (coronary artery disease) - continue home meds  *  Hypothyroidism - home dose thyroid replacement  *  GERD (gastroesophageal reflux disease) - PPI as above  * mild cognitive decline vs depression ---seen by Dr [name] [name] -trial of zoloft 25 mg qhs. Pt will f/u geropsych  Dr [name] Needle in [facility]  D/c to rehab if she is stable  Case discussed with Care Management/Social Worker. Management plans discussed with the patient, family and they are in agreement.  CODE STATUS: Full code  DVT Prophylaxis: SCD  TOTAL TIME TAKING CARE OF THIS PATIENT: *30* minutes.  >50% time spent on counselling and coordination of care  POSSIBLE D/C IN *1-2* DAYS, DEPENDING ON CLINICAL CONDITION.  Note: This dictation was prepared with Dragon dictation along with smaller phrase technology. Any transcriptional errors that result from this process are unintentional.  [name] [name] M.D on 02/15/2017 at 8:19 PM  Between 7am to 6pm - Pager - [phone]  After 6pm go to [email or web address] - password [email or web address] [facility]  Office  [phone]  CC: Primary care physician; [name] Spikes, DO

## 2017-02-16 ENCOUNTER — Ambulatory Visit: Payer: Self-pay | Admitting: *Deleted

## 2017-02-16 ENCOUNTER — Other Ambulatory Visit: Payer: Self-pay | Admitting: *Deleted

## 2017-02-16 DIAGNOSIS — R2681 Unsteadiness on feet: Secondary | ICD-10-CM | POA: Diagnosis not present

## 2017-02-16 DIAGNOSIS — R4789 Other speech disturbances: Secondary | ICD-10-CM | POA: Diagnosis not present

## 2017-02-16 DIAGNOSIS — Z7401 Bed confinement status: Secondary | ICD-10-CM | POA: Diagnosis not present

## 2017-02-16 DIAGNOSIS — I2510 Atherosclerotic heart disease of native coronary artery without angina pectoris: Secondary | ICD-10-CM | POA: Diagnosis not present

## 2017-02-16 DIAGNOSIS — M6281 Muscle weakness (generalized): Secondary | ICD-10-CM | POA: Diagnosis not present

## 2017-02-16 DIAGNOSIS — K589 Irritable bowel syndrome without diarrhea: Secondary | ICD-10-CM | POA: Diagnosis not present

## 2017-02-16 DIAGNOSIS — N183 Chronic kidney disease, stage 3 (moderate): Secondary | ICD-10-CM | POA: Diagnosis not present

## 2017-02-16 DIAGNOSIS — E119 Type 2 diabetes mellitus without complications: Secondary | ICD-10-CM | POA: Diagnosis not present

## 2017-02-16 DIAGNOSIS — G3184 Mild cognitive impairment, so stated: Secondary | ICD-10-CM | POA: Diagnosis not present

## 2017-02-16 DIAGNOSIS — E039 Hypothyroidism, unspecified: Secondary | ICD-10-CM | POA: Diagnosis not present

## 2017-02-16 DIAGNOSIS — R6889 Other general symptoms and signs: Secondary | ICD-10-CM | POA: Diagnosis not present

## 2017-02-16 DIAGNOSIS — E1110 Type 2 diabetes mellitus with ketoacidosis without coma: Secondary | ICD-10-CM | POA: Diagnosis not present

## 2017-02-16 DIAGNOSIS — J449 Chronic obstructive pulmonary disease, unspecified: Secondary | ICD-10-CM | POA: Diagnosis not present

## 2017-02-16 DIAGNOSIS — Z9181 History of falling: Secondary | ICD-10-CM | POA: Diagnosis not present

## 2017-02-16 DIAGNOSIS — R488 Other symbolic dysfunctions: Secondary | ICD-10-CM | POA: Diagnosis not present

## 2017-02-16 DIAGNOSIS — E1310 Other specified diabetes mellitus with ketoacidosis without coma: Secondary | ICD-10-CM | POA: Diagnosis not present

## 2017-02-16 DIAGNOSIS — N179 Acute kidney failure, unspecified: Secondary | ICD-10-CM | POA: Diagnosis not present

## 2017-02-16 LAB — FOLATE: FOLATE: 7.6 ng/mL (ref >5.9)

## 2017-02-16 LAB — BLOOD GAS, VENOUS
ACID-BASE DEFICIT: 8.4 mmol/L — AB (ref 0.0–2.0)
Bicarbonate: 17.9 mmol/L — ABNORMAL LOW (ref 20.0–28.0)
PATIENT TEMPERATURE: 37
PH VEN: 7.27 (ref 7.250–7.430)
pCO2, Ven: 39 mmHg — ABNORMAL LOW (ref 44.0–60.0)

## 2017-02-16 LAB — GLUCOSE, CAPILLARY
GLUCOSE-CAPILLARY: 427 mg/dL — AB (ref 65–99)
GLUCOSE-CAPILLARY: 444 mg/dL — AB (ref 65–99)
Glucose-Capillary: 342 mg/dL — ABNORMAL HIGH (ref 65–99)
Glucose-Capillary: 373 mg/dL — ABNORMAL HIGH (ref 65–99)
Glucose-Capillary: 336 mg/dL — ABNORMAL HIGH (ref 65–99)

## 2017-02-16 LAB — VITAMIN B12: VITAMIN B 12: 317 pg/mL (ref 180–914)

## 2017-02-16 LAB — TSH: TSH: 0.689 u[IU]/mL (ref 0.350–4.500)

## 2017-02-16 MED ORDER — INSULIN GLARGINE 100 UNIT/ML ~~LOC~~ SOLN: 22.0000 [IU] | Freq: Every day | SUBCUTANEOUS | 11 refills | Status: DC

## 2017-02-16 MED ORDER — INSULIN ASPART 100 UNIT/ML ~~LOC~~ SOLN
15.0000 [IU] | Freq: Once | SUBCUTANEOUS | Status: AC
  Administered 2017-02-16: 15 [IU] via SUBCUTANEOUS
  Filled 2017-02-16: qty 1

## 2017-02-16 MED ORDER — SERTRALINE HCL 25 MG PO TABS: 25.0000 mg | ORAL_TABLET | Freq: Every day | ORAL | 1 refills | Status: AC

## 2017-02-16 NOTE — Progress Notes (Addendum)
Patient is medically stable for D/C to [facility] today. Per [facility] admissions coordinator at [facility] patient can come today to room 103. RN will call report and arrange EMS for transport. Clinical Education officer, museum (CSW) sent D/C orders to [facility] via [place]. Patient is aware of above. Patient's sons [name] and Charlotte Crumb and aware of above. Patient's granddaughter [name] is aware if above. Sons and granddaughter are aware patient will be in her co-pay days at [facility]. [name] is going to [facility] today to complete admissions paper work. Please reconsult if future social work needs arise. CSW signing off.   CSW also contacted patient's daughter [name] and made her aware of above. Per [name] she is patient's HPOA and has paper work that she will bring to [facility]. [name] reported that her brother [name] handles patient's finances.   [employer], LCSW [phone]

## 2017-02-16 NOTE — Progress Notes (Addendum)
Notified Dr Posey Pronto that patient has blood sugar of [phone]. MD ordered 15 units novolog once.

## 2017-02-16 NOTE — Progress Notes (Addendum)
Inpatient Diabetes Program Recommendations  AACE/ADA: New Consensus Statement on Inpatient Glycemic Control (2015)  Target Ranges:  Prepandial:   less than 140 mg/dL      Peak postprandial:   less than 180 mg/dL (1-2 hours)      Critically ill patients:  140 - 180 mg/dL   Results for ARELENE, MORONI (MRN [id number]) as of 02/16/2017 11:15  Ref. Range 02/15/2017 07:30 02/15/2017 11:37 02/15/2017 16:19 02/15/2017 20:44 02/16/2017 02:50 02/16/2017 04:50 02/16/2017 07:38  Glucose-Capillary Latest Ref Range: 65 - 99 mg/dL 143 (H) 287 (H) 262 (H) 125 (H) 342 (H) 373 (H) 336 (H)   Review of Glycemic Control  Diabetes history: DM 2 Outpatient Diabetes medications: Lantus 22 units, Humalog 10 units breakfast, 6 units lunch, 10 units supper meal coverage, Metformin 500 mg BID Current orders for Inpatient glycemic control: Lantus 22 units Daily, Novolog 0-9 units tid  Inpatient Diabetes Program Recommendations:    Note Metformin started this am and A1c ordered. Patient takes meal coverage at home.  If glucose stays elevated consider Novolog 2-3 units tid meal coverage if patient is consuming at least 50% of meals.   Thanks,  Tama Headings RN, MSN, [facility] Inpatient Diabetes Coordinator Team Pager [phone] (8a-5p)

## 2017-02-16 NOTE — Discharge Summary (Signed)
[facility] [place] [facility] [place] NAME: [name] [name]    MR#:  [id number]  DATE OF BIRTH:  03-Jan-1932  DATE OF ADMISSION:  02/12/2017 ADMITTING PHYSICIAN: Lance Coon, MD  DATE OF DISCHARGE: 02/16/17  PRIMARY CARE PHYSICIAN: [name], DO    ADMISSION DIAGNOSIS:  Hyperthyroidism [E05.90] Altered mental status, unspecified altered mental status type [R41.82] Diabetic ketoacidosis without coma associated with type 1 diabetes mellitus ([place]) [E10.10]  DISCHARGE DIAGNOSIS:  DKA in Dm-2--resolved AMS improving  SECONDARY DIAGNOSIS:   Past Medical History:  Diagnosis Date  . Anxiety state, unspecified   . CAD (coronary artery disease)   . Cancer [facility])    2004 Right breast, found on mammogram, radiation therapy, Dr. Bryson Ha, uterine cancer,   . COPD (chronic obstructive pulmonary disease) ([place])   . Diabetes mellitus   . Esophageal reflux   . Heart burn   . Hyperlipidemia   . IBS (irritable bowel syndrome)   . Myocardial infarction [facility])    Cath negative except for 40% occlusion LAD.  Pt not candidate for betablocker or ACEI because of hypotension  . OSA (obstructive sleep apnea)   . Osteoporosis 02/21/09   DEXA scan showed osteoporosis with left femur T-score -2.8.  Marland Kitchen Presbyacusis   . Stroke ([place])   . Thyroid disease    Hypothyroid  . Vitamin D deficiency     HOSPITAL COURSE:  [name] [name] a 83 y.o.femalewho presents with Altered mental status. Patient states that she began having nausea and vomiting yesterday. She began feeling worse. She had an episode of black emesis with coffee ground material in it. She came to the ED today and was found to be in mild DKA.   * DKA (diabetic ketoacidoses) ([name] [name]) - patient's anion gap is elevated, but just barely.  -now off IV insulin drip  -resumed home dose insulin and metformin  *Acute on chronic renal failure (HCC) - related to her DKA and dehydration -recieved IV fluids  ---well hydrated - avoid nephrotoxins   *Coffee ground emesis - Received IV PPI -GI consult appreciated. Since pt doing well no further w/u recommended -Hemoglobin stable. Patient not vomiting.Continue PPI.  *CAD (coronary artery disease) - continue home meds  *Hypothyroidism - home dose thyroid replacement  *GERD (gastroesophageal reflux disease) - PPI as above  * mild cognitive decline vs depression ---seen by Dr Nicolasa Ducking -trial of zoloft 25 mg qhs. Pt will f/u geropsych  Dr Casimiro Needle in [place]  D/c to rehab today  Spoke with emily and pt's son in the room CONSULTS OBTAINED:  Treatment Team:  [place], MD  DRUG ALLERGIES:   Allergies  Allergen Reactions  . Penicillins Rash    Has patient had a PCN reaction causing immediate rash, facial/tongue/throat swelling, SOB or lightheadedness with hypotension: No Has patient had a PCN reaction causing severe rash involving mucus membranes or skin necrosis: No Has patient had a PCN reaction that required hospitalization No Has patient had a PCN reaction occurring within the last 10 years: No If all of the above answers are "NO", then may proceed with Cephalosporin use.     DISCHARGE MEDICATIONS:   Current Discharge Medication List    START taking these medications   Details  sertraline (ZOLOFT) 25 MG tablet Take 1 tablet (25 mg total) by mouth daily after breakfast. Qty: 30 tablet, Refills: 1      CONTINUE these medications which have CHANGED   Details  insulin glargine (LANTUS) 100 UNIT/ML injection Inject  0.22 mLs (22 Units total) into the skin daily. Qty: 10 mL, Refills: 11      CONTINUE these medications which have NOT CHANGED   Details  aspirin EC 81 MG tablet Take 1 tablet (81 mg total) by mouth daily. Qty: 90 tablet, Refills: 3    atorvastatin (LIPITOR) 80 MG tablet Take 1 tablet (80 mg total) by mouth daily. Qty: 90 tablet, Refills: 3    clopidogrel (PLAVIX) 75 MG tablet TAKE 1  TABLET(75 MG) BY MOUTH DAILY Qty: 90 tablet, Refills: 3    fluticasone (FLONASE) 50 MCG/ACT nasal spray Place 2 sprays into both nostrils daily. Qty: 16 g, Refills: 6    glucagon 1 MG injection Inject 1 mg into the vein once as needed.    insulin lispro (HUMALOG) 100 UNIT/ML injection Inject 6-10 Units into the skin 3 (three) times daily before meals. take 10 units at breakfast, 6 units at lunch, 10 units at supper.    levothyroxine (SYNTHROID, LEVOTHROID) 100 MCG tablet TAKE 1 TABLET(100 MCG) BY MOUTH DAILY Qty: 90 tablet, Refills: 3    metFORMIN (GLUCOPHAGE) 500 MG tablet TAKE 1 TABLET(500 MG) BY MOUTH TWICE DAILY WITH A MEAL Qty: 180 tablet, Refills: 1    midodrine (PROAMATINE) 10 MG tablet Take 1 tablet (10 mg total) by mouth 3 (three) times daily with meals. Qty: 90 tablet, Refills: 1    pantoprazole (PROTONIX) 40 MG tablet Take 1 tablet (40 mg total) by mouth 2 (two) times daily. Qty: 180 tablet, Refills: 3        If you experience worsening of your admission symptoms, develop shortness of breath, life threatening emergency, suicidal or homicidal thoughts you must seek medical attention immediately by calling 911 or calling your MD immediately  if symptoms less severe.  You Must read complete instructions/literature along with all the possible adverse reactions/side effects for all the Medicines you take and that have been prescribed to you. Take any new Medicines after you have completely understood and accept all the possible adverse reactions/side effects.   Please note  You were cared for by a hospitalist during your hospital stay. If you have any questions about your discharge medications or the care you received while you were in the hospital after you are discharged, you can call the unit and asked to speak with the hospitalist on call if the hospitalist that took care of you is not available. Once you are discharged, your primary care physician will handle any further  medical issues. Please note that NO REFILLS for any discharge medications will be authorized once you are discharged, as it is imperative that you return to your primary care physician (or establish a relationship with a primary care physician if you do not have one) for your aftercare needs so that they can reassess your need for medications and monitor your lab values. Today   SUBJECTIVE   I feel a lot better  VITAL SIGNS:  Blood pressure 137/66, pulse 64, temperature 98.2 F (36.8 C), temperature source Oral, resp. rate 19, height 5\' 6"  (1.676 m), weight 63 kg (138 lb 12.8 oz), SpO2 95 %.  I/O:   Intake/Output Summary (Last 24 hours) at 02/16/17 1317 Last data filed at 02/16/17 0800  Gross per 24 hour  Intake              840 ml  Output                0 ml  Net  840 ml    PHYSICAL EXAMINATION:  GENERAL:  83 y.o.-year-old patient lying in the bed with no acute distress.  EYES: Pupils equal, round, reactive to light and accommodation. No scleral icterus. Extraocular muscles intact.  HEENT: Head atraumatic, normocephalic. Oropharynx and nasopharynx clear.  NECK:  Supple, no jugular venous distention. No thyroid enlargement, no tenderness.  LUNGS: Normal breath sounds bilaterally, no wheezing, rales,rhonchi or crepitation. No use of accessory muscles of respiration.  CARDIOVASCULAR: S1, S2 normal. No murmurs, rubs, or gallops.  ABDOMEN: Soft, non-tender, non-distended. Bowel sounds present. No organomegaly or mass.  EXTREMITIES: No pedal edema, cyanosis, or clubbing.  NEUROLOGIC: Cranial nerves II through XII are intact. Muscle strength 5/5 in all extremities. Sensation intact. Gait not checked.  PSYCHIATRIC: The patient is alert and oriented x 3.  SKIN: No obvious rash, lesion, or ulcer.   DATA REVIEW:   CBC   Recent Labs Lab 02/13/17 0502  WBC 5.5  HGB 9.0*  HCT 26.3*  PLT 210    Chemistries   Recent Labs Lab 02/12/17 2315 02/13/17 0502 02/15/17 0449   NA 138 141  --   K 4.8 3.9  --   CL 107 111  --   CO2 22 24  --   GLUCOSE 295* 184* 199*  BUN 28* 29*  --   CREATININE 1.27* 1.18*  --   CALCIUM 9.1 8.9  --   AST 30  --   --   ALT 26  --   --   ALKPHOS 113  --   --   BILITOT 0.9  --   --     Microbiology Results   No results found for this or any previous visit (from the past 240 hour(s)).  RADIOLOGY:  No results found.   Management plans discussed with the patient, family and they are in agreement.  CODE STATUS:     Code Status Orders        Start     Ordered   02/12/17 2243  Full code  Continuous     02/12/17 2242    Code Status History    Date Active Date Inactive Code Status Order ID Comments User Context   01/13/2017  8:22 PM 01/16/2017  7:10 PM Full Code [id number]  [place], Ubaldo Glassing, DO Inpatient   11/06/2016 11:26 AM 11/15/2016  2:06 AM DNR [id number]  Flora Lipps, MD Inpatient   11/06/2016  3:25 AM 11/06/2016 11:26 AM Full Code [id number]  Saundra Shelling, MD Inpatient   07/15/2016  6:36 PM 07/17/2016 10:11 PM Full Code [id number]  Fritzi Mandes, MD Inpatient   06/08/2015  9:01 PM 06/11/2015  9:20 PM Full Code [id number]  Henreitta Leber, MD Inpatient   06/08/2015  5:16 PM 06/08/2015  9:01 PM Full Code [id number]  Henreitta Leber, MD ED      TOTAL TIME TAKING CARE OF THIS PATIENT: *40* minutes.    Catherine Cubero M.D on 02/16/2017 at 1:17 PM  Between 7am to 6pm - Pager - [phone] After 6pm go to [email or web address] - password [email or web address] [facility]  Office  [phone]  CC: Primary care physician; [name], DO

## 2017-02-16 NOTE — Progress Notes (Signed)
Patient discharged to [facility] per MD order. Report given to Christean Leaf RN at facility. Will call EMS for transport.

## 2017-02-16 NOTE — Patient Outreach (Signed)
[place] in [place] pt discharged to [facility] today after recent hospitalization 02/12/17 -02/16/17 for CAD, GERD, DKA, Acute on chronic renal failure, coffee ground emesis, adjustment disorder with mixed anxiety and depressed mood.   This RN CM to follow up with pt post discharge from [facility].    [name]   [place] Care Management  [phone]

## 2017-02-16 NOTE — Clinical Social Work Placement (Addendum)
   CLINICAL SOCIAL WORK PLACEMENT  NOTE  Date:  02/16/2017  Patient Details  Name: [name] [name] MRN: [id number] Date of Birth: 1932/06/28  Clinical Social Work is seeking post-discharge placement for this patient at the [place] level of care (*CSW will initial, date and re-position this form in  chart as items are completed):  Yes   Patient/family provided with [place] Work Department's list of facilities offering this level of care within the geographic area requested by the patient (or if unable, by the patient's family).  Yes   Patient/family informed of their freedom to choose among providers that offer the needed level of care, that participate in Medicare, Medicaid or managed care program needed by the patient, have an available bed and are willing to accept the patient.  Yes   Patient/family informed of [facility]'s ownership interest in [facility] and [facility], as well as of the fact that they are under no obligation to receive care at these facilities.  PASRR submitted to EDS on       PASRR number received on       Existing PASRR number confirmed on 02/15/17     FL2 transmitted to all facilities in geographic area requested by pt/family on 02/15/17     FL2 transmitted to all facilities within larger geographic area on       Patient informed that his/her managed care company has contracts with or will negotiate with certain facilities, including the following:            Patient/family informed of bed offers received.  Patient chooses bed at [facility]     Physician recommends and patient chooses bed at [facility]    Patient to be transferred to [facility] on 02/16/17.  Patient to be transferred to facility by [facility]      Patient family notified on 02/16/17 of transfer.  Name of family member notified:  Patient's sons [name] and Charlotte Crumb are aware of D/C today and patient's granddaughter [name] is  aware of D/C today.   Patient's daughter [name] is also aware of D/C today.   PHYSICIAN       Additional Comment:    _______________________________________________ Tamra Koos, Veronia Beets, LCSW 02/16/2017, 2:32 PM

## 2017-02-17 DIAGNOSIS — Z9181 History of falling: Secondary | ICD-10-CM | POA: Diagnosis not present

## 2017-02-17 DIAGNOSIS — E119 Type 2 diabetes mellitus without complications: Secondary | ICD-10-CM | POA: Diagnosis not present

## 2017-02-18 ENCOUNTER — Telehealth: Payer: Self-pay | Admitting: Family Medicine

## 2017-02-18 NOTE — Telephone Encounter (Signed)
Patient discharged to [facility] place will continue to follow for TCM  Upon discharge.

## 2017-02-19 DIAGNOSIS — I2510 Atherosclerotic heart disease of native coronary artery without angina pectoris: Secondary | ICD-10-CM | POA: Diagnosis not present

## 2017-02-19 DIAGNOSIS — K589 Irritable bowel syndrome without diarrhea: Secondary | ICD-10-CM | POA: Diagnosis not present

## 2017-02-19 DIAGNOSIS — J449 Chronic obstructive pulmonary disease, unspecified: Secondary | ICD-10-CM | POA: Diagnosis not present

## 2017-02-19 DIAGNOSIS — E119 Type 2 diabetes mellitus without complications: Secondary | ICD-10-CM | POA: Diagnosis not present

## 2017-02-19 LAB — HEMOGLOBIN A1C
Hgb A1c MFr Bld: 8.5 % — ABNORMAL HIGH (ref 4.8–5.6)
Mean Plasma Glucose: 197 mg/dL

## 2017-02-20 ENCOUNTER — Ambulatory Visit: Payer: Medicare Other | Admitting: Family Medicine

## 2017-02-20 ENCOUNTER — Other Ambulatory Visit: Payer: Self-pay | Admitting: *Deleted

## 2017-02-20 NOTE — Patient Outreach (Signed)
[facility]) Care Management  02/20/2017  [name] [name] July 16, 1932 [id number]   Patient discharged [facility] for rehab after recent hospitalization from 02/12/17-02/16/17.  This Education officer, museum to follow up with patient while at [facility] to assist with discharge planning.   Sheralyn Boatman [facility] Care Management [phone]

## 2017-02-26 DIAGNOSIS — E119 Type 2 diabetes mellitus without complications: Secondary | ICD-10-CM | POA: Diagnosis not present

## 2017-02-26 DIAGNOSIS — Z9181 History of falling: Secondary | ICD-10-CM | POA: Diagnosis not present

## 2017-03-02 ENCOUNTER — Other Ambulatory Visit: Payer: Self-pay | Admitting: *Deleted

## 2017-03-02 ENCOUNTER — Encounter: Payer: Self-pay | Admitting: *Deleted

## 2017-03-02 ENCOUNTER — Ambulatory Visit: Payer: Medicare Other | Admitting: *Deleted

## 2017-03-02 NOTE — Patient Outreach (Signed)
[facility]) Care Management  [facility] Social Work  03/02/2017  [name] [name] 01/23/1932 [id number]  Subjective:  Patient is a 83 year old female currently in rehab following a hospital stay from 02/12/17-02/16/17 for dehydration, dizziness, acute cystitis without hematuria, acute kidney injury. Patient found sitting in her room, oriented x1. Patient appeared visibly ill and lethargic. Per patient, "I am sick". Patient not able to keep any food down. Patient response to questions limitd very delayed, she spoke in a low tone.  This   Objective:   Encounter Medications:  Outpatient Encounter Prescriptions as of 03/02/2017  Medication Sig Note  . aspirin EC 81 MG tablet Take 1 tablet (81 mg total) by mouth daily.   Marland Kitchen atorvastatin (LIPITOR) 80 MG tablet Take 1 tablet (80 mg total) by mouth daily.   . clopidogrel (PLAVIX) 75 MG tablet TAKE 1 TABLET(75 MG) BY MOUTH DAILY   . fluticasone (FLONASE) 50 MCG/ACT nasal spray Place 2 sprays into both nostrils daily. 01/29/2017: As needed.   Marland Kitchen glucagon 1 MG injection Inject 1 mg into the vein once as needed. 01/20/2017: Available if needed.   . insulin glargine (LANTUS) 100 UNIT/ML injection Inject 0.22 mLs (22 Units total) into the skin daily.   . insulin lispro (HUMALOG) 100 UNIT/ML injection Inject 6-10 Units into the skin 3 (three) times daily before meals. take 10 units at breakfast, 6 units at lunch, 10 units at supper. 01/19/2017: With additional sliding scale 200-249 1 250-299 3 300-349 5 350-399 7 >400 9      . levothyroxine (SYNTHROID, LEVOTHROID) 100 MCG tablet TAKE 1 TABLET(100 MCG) BY MOUTH DAILY   . metFORMIN (GLUCOPHAGE) 500 MG tablet TAKE 1 TABLET(500 MG) BY MOUTH TWICE DAILY WITH A MEAL   . midodrine (PROAMATINE) 10 MG tablet Take 1 tablet (10 mg total) by mouth 3 (three) times daily with meals.   . pantoprazole (PROTONIX) 40 MG tablet Take 1 tablet (40 mg total) by mouth 2 (two) times daily.   . sertraline (ZOLOFT) 25 MG  tablet Take 1 tablet (25 mg total) by mouth daily after breakfast.    No facility-administered encounter medications on file as of 03/02/2017.     Functional Status:  In your present state of health, do you have any difficulty performing the following activities: 02/12/2017 01/13/2017  Hearing? Tempie Donning  Vision? N N  Difficulty concentrating or making decisions? Tempie Donning  Walking or climbing stairs? Y Y  Dressing or bathing? Y N  Doing errands, shopping? N Y  Conservation officer, nature and eating ? - -  Comment - -  Using the Toilet? - -  In the past six months, have you accidently leaked urine? - -  Do you have problems with loss of bowel control? - -  Managing your Medications? - -  Managing your Finances? - -  Comment - -  Housekeeping or managing your Housekeeping? - -  Comment - -  Some recent data might be hidden    Fall/Depression Screening:  PHQ 2/9 Scores 12/15/2016 11/27/2016 08/15/2016 04/01/2016 01/04/2016 08/15/2014 07/26/2012  PHQ - 2 Score 1 3 0 0 0 0 0  PHQ- 9 Score - 8 - - - - -    Assessment: Patient's response to questions limited and very delayed. Patient spoke in a barely audible tone.  Patient visibly ill. Patient was unable to verbalize plan of care at this time.  This social worker spoke with Peter Minium, discharge planner who stated that patient has made  limited progress in therapy due to her low blood sugars. However the plan is to work on stride and posture. Per Drue Dun, she has not been able to reach patient's family to schedule a care planning meeting. This Education officer, museum provided her with additional contacts on [facility] consent, specifically her son in [place] and great granddaughter and caregiver [name].  Plan: This Education officer, museum will follow up with patient in approximately 2 weeks to discuss patient's progress in rehab and to discuss potential discharge plans.    Sheralyn Boatman [facility] Care Management [phone]

## 2017-03-02 NOTE — Telephone Encounter (Signed)
This encounter was created in error - please disregard.

## 2017-03-05 DIAGNOSIS — K589 Irritable bowel syndrome without diarrhea: Secondary | ICD-10-CM | POA: Diagnosis not present

## 2017-03-05 DIAGNOSIS — I2510 Atherosclerotic heart disease of native coronary artery without angina pectoris: Secondary | ICD-10-CM | POA: Diagnosis not present

## 2017-03-05 DIAGNOSIS — E119 Type 2 diabetes mellitus without complications: Secondary | ICD-10-CM | POA: Diagnosis not present

## 2017-03-05 DIAGNOSIS — J449 Chronic obstructive pulmonary disease, unspecified: Secondary | ICD-10-CM | POA: Diagnosis not present

## 2017-03-10 ENCOUNTER — Other Ambulatory Visit: Payer: Self-pay | Admitting: *Deleted

## 2017-03-10 DIAGNOSIS — E119 Type 2 diabetes mellitus without complications: Secondary | ICD-10-CM | POA: Diagnosis not present

## 2017-03-10 NOTE — Patient Outreach (Signed)
[facility]) Care Management  03/10/2017  [name] [name] 08/24/1931 [id number]   Phone call to discharge planner at [facility] to follow up on patient's progress while in rehab and to discuss anticipated discharge. Voicemail message left for a return call.   Sheralyn Boatman [facility] Care Management [phone]

## 2017-03-10 NOTE — Patient Outreach (Signed)
[facility]) Care Management  03/10/2017  [name] [name] April 11, 1932 [id number]   Return phone call from discharge planner Wynonia Lawman at [facility] who confirmed patient's discharge form 03/12/17 with [facility]. Due to patient rigidity, tremors and flat affect it ws recommended by therapy that patient follow up with a Neurologist to rule out Parkinson's disease.   Sheralyn Boatman [facility] Care Management [phone]

## 2017-03-10 NOTE — Patient Outreach (Signed)
[facility]) Care Management  03/10/2017  [name] [name] [birth year] [id number]   Phone call to patient's great granddaughter [name] on [facility] consent to discuss patient's progress in rehab. Per [name], patient spoke with the discharge planner. They plan to discharge her on 03/12/17 with home health to follow up with a Neurologist due to signs and symptoms of Parkinson's.   Per [name], she has spoken to patient's son [name] who resides in [place] about the possibility of facility care, however he is adamant that patient return home. He will consider  patient moving with him in [place] if patient does not improve at home.   Plan:This social worker will inform RNCM of patient's discharge home.  Sheralyn Boatman [facility] Care Management [phone]

## 2017-03-13 ENCOUNTER — Other Ambulatory Visit: Payer: Self-pay | Admitting: *Deleted

## 2017-03-13 NOTE — Patient Outreach (Signed)
Second attempt made to contact pt on mobile number listed,follow up on recent SNF discharge ([facility] 03/12/17 per [facility] LCSW),  son Laverna Peace (on pt's consent) answered the phone, reports he is not at home, HIPAA identifiers verified on pt (name, dob).  Laverna Peace reports pt did discharge home yesterday, best contact for following up on pt is with [name] (on consent) as pt cannot hear good on the phone.   Discussed with Laverna Peace attempt call made earlier to [facility], requested of Laverna Peace to have [facility] call back RN CM back.    Plan:  If no response from [facility], plan to follow up again next week telephonically.    [name]   [place] Care Management  [phone]

## 2017-03-13 NOTE — Patient Outreach (Signed)
Unsuccessful telephone encounter to [name] [name] (per pt- great granddaughter, on consent, main contact) for  follow up on recent SNF discharge as this RN CM was informed by [facility] LCSW  pt to discharge from [facility] 03/12/17.  Pt admitted to rehab after recent hospitalization July 19-23,2018 for Hypothyroidism, Altered mental status, DKA in Dm-2.  Pt's history includes but not limited to CAD, Cancer right breast, Hyperlipidemia, MI, OSA, Stroke.    HIPAA compliant voice message left with RN CM's contact name and number.    Plan:  If no response, plan to call pt at home as [name] is main caregiver.    [name]   [place] Care Management  [phone]

## 2017-03-16 ENCOUNTER — Other Ambulatory Visit: Payer: Self-pay | Admitting: *Deleted

## 2017-03-16 NOTE — Patient Outreach (Signed)
Successful telephone encounter to [name] [name] (pt's great granddaughter, on consent form, main contact- pt HOH), follow up on pt's  recent SNF ([facility]) discharge August 16,2018, admitted to rehab after recent hospitalization July 19-23,2018 for Hyperthyroidism, altered mental status, Diabetes type 2, coffee ground emesis.    Spoke with [name], HIPAA identifiers verified, discussed purpose of call - pt's recent SNF discharge.   [name] reports on pt since discharge home- mind wise here and there,still rigid/tremors/flat effect, strength wise- weakest I have ever seen her/cannot get out of bed by herself/needs assistance walking short distances, no control over bowels or urine.   [name] reports talked to son [name] (live in [place]), to see how it goes with pt at home, family coming in more to stay with pt.  [name] reports pt has an appointment to see Dr. Casimiro Needle - psychiatrist because pt was started on Zoloft but appointment is early am to which pt does not do good in am, to call and reschedule.  [name] reports pt's sugars have been more on the low side, 8/17 at night daughter reported pt's sugar was 40, glucagon was given- sugar came up; 8/18 at night 52.  [name] reports pt's sugar this am was 52, gave pt something to eat, rechecked  an hour later- came up to 148.  [name] reports she has been holding pt's insulin when sugars are low.   [name] reports she has been giving pt her medications and insulin- ** Patient was recently discharged from hospital and all medications have been reviewed.  [name] reports [facility] was ordered, has not contacted pt yet, plan to call.   [name] reports pt's son [name] will be coming in September with plans to bring pt back to [place] to live.   RN CM discussed with [name] plan to follow pt for transition of care, home visit scheduled for next week.  Plan:  As discussed with [name], plan to follow up again next week- home visit.            As discussed, [name] to call [facility]  health as well as PCP-               (Schedule pt's post discharge appointment/inform about low sugars).    [name]   [place] Care Management  [phone]

## 2017-03-20 ENCOUNTER — Ambulatory Visit (HOSPITAL_COMMUNITY): Payer: Medicare Other | Admitting: Psychiatry

## 2017-03-20 ENCOUNTER — Ambulatory Visit: Payer: Self-pay | Admitting: *Deleted

## 2017-03-23 ENCOUNTER — Telehealth: Payer: Self-pay | Admitting: Family Medicine

## 2017-03-23 ENCOUNTER — Other Ambulatory Visit: Payer: Self-pay | Admitting: *Deleted

## 2017-03-23 ENCOUNTER — Ambulatory Visit: Payer: Self-pay | Admitting: *Deleted

## 2017-03-23 NOTE — Patient Outreach (Signed)
[facility]) Care Management  03/23/2017  [name] [name] 1932-03-05 [id number]  Voicemail received from patient's granddaughter [name]. This social worker called [name] back and received a [employer].  Voicemail message left requesting a return call.   Sheralyn Boatman [facility] Care Management [phone]

## 2017-03-23 NOTE — Telephone Encounter (Signed)
fyi

## 2017-03-23 NOTE — Telephone Encounter (Signed)
Patient Name: [name] [name]  DOB: 03/17/1932    Initial Comment Caller states she is [name] and she had a patient that her blood sugar is at 42.    Nurse Assessment  Nurse: Thad Ranger RN, Langley Gauss Date/Time (Eastern Time): 03/23/2017 4:02:37 PM  Confirm and document reason for call. If symptomatic, describe symptoms. ---Caller states she is [name] and she has a patient and her blood sugar is at 42. She gave her graham cracker w/peanut butter and OJ. BG back up to 140. Pt if feeling fine now. Hypoglycemic episodes have occured qd since last Thurs. She takes Humalog 10u w/bkfst, 6u w/lunch, and 10u w/dinner plus s/s; Lantus 20u sq q hs; Metformin 500mg  po bid.  Does the patient have any new or worsening symptoms? ---Yes  Will a triage be completed? ---Yes  Related visit to physician within the last 2 weeks? ---No  Does the PT have any chronic conditions? (i.e. diabetes, asthma, etc.) ---Yes  List chronic conditions. ---IDDM, Kidney dx, Dementia  Is this a behavioral health or substance abuse call? ---No     Guidelines    Guideline Title Affirmed Question Affirmed Notes  Diabetes - Low Blood Sugar [1] Evening (after bedtime snack) blood glucose < 100 mg/dl (5.6 mmol/l) AND [2] more than once in past week    Final Disposition User   Go to ED Now (or PCP triage) Thad Ranger, RN, Denise    Comments  Pt has an appt in the am at 24 with the NP (name unknown per the caller). States pt does not want to get out this eve, and will keep the appt for the am as prev sched. Pcg states if her BG drops again this eve, she will take her to the ER.  Upgraded due to pt having hypoglycemic episodes qd since last wk and may require immed med change.   Referrals  GO TO FACILITY REFUSED  GO TO FACILITY REFUSED   Disagree/Comply: Disagree  Disagree/Comply Reason: Declined PCP Triage

## 2017-03-23 NOTE — Telephone Encounter (Signed)
Patient has bottomed out everyday since Thursday. It usually happens between lunch and dinner. Patient has had a poor appetite. She has appointment scheduled for tomorrow at 11:30 with Joycelyn Schmid. Was advised to go to ED if blood sugar bottomed out again before then.

## 2017-03-23 NOTE — Telephone Encounter (Signed)
Patient has been experiencing low blood sugars since Friday . Her blood sugar was checked today and it was in the 40's. She was transferred to [facility].

## 2017-03-23 NOTE — Patient Outreach (Signed)
[facility]) Care Management  03/23/2017  CURTISHA BENDIX [birth year] [id number]   Follow up phone call to patient and her great granddaughter on [facility]  consent after her rehab stay at [facility]. Spoke with [name], who asked to return the call later as she was helping patient with a bath.   Sheralyn Boatman [facility] Care Management [phone]

## 2017-03-23 NOTE — Telephone Encounter (Signed)
FYI you are seeing her tomorrow at 11:30. Sounds like she may need her insulin adjusted.

## 2017-03-24 ENCOUNTER — Other Ambulatory Visit: Payer: Self-pay | Admitting: *Deleted

## 2017-03-24 ENCOUNTER — Encounter: Payer: Self-pay | Admitting: Family

## 2017-03-24 ENCOUNTER — Encounter: Payer: Self-pay | Admitting: *Deleted

## 2017-03-24 ENCOUNTER — Ambulatory Visit: Payer: Self-pay | Admitting: *Deleted

## 2017-03-24 ENCOUNTER — Ambulatory Visit (INDEPENDENT_AMBULATORY_CARE_PROVIDER_SITE_OTHER): Payer: Medicare Other | Admitting: Family

## 2017-03-24 VITALS — BP 120/60 | HR 86 | Temp 98.1°F | Ht 66.0 in

## 2017-03-24 DIAGNOSIS — IMO0002 Reserved for concepts with insufficient information to code with codable children: Secondary | ICD-10-CM

## 2017-03-24 DIAGNOSIS — E1151 Type 2 diabetes mellitus with diabetic peripheral angiopathy without gangrene: Secondary | ICD-10-CM

## 2017-03-24 DIAGNOSIS — E1165 Type 2 diabetes mellitus with hyperglycemia: Secondary | ICD-10-CM | POA: Diagnosis not present

## 2017-03-24 LAB — BASIC METABOLIC PANEL
BUN: 21 mg/dL (ref 6–23)
CALCIUM: 10 mg/dL (ref 8.4–10.5)
CHLORIDE: 100 meq/L (ref 96–112)
CO2: 27 meq/L (ref 19–32)
CREATININE: 1.06 mg/dL (ref 0.40–1.20)
GFR: 52.62 mL/min — ABNORMAL LOW (ref >60.00)
GLUCOSE: 171 mg/dL — AB (ref 70–99)
Potassium: 3.7 mEq/L (ref 3.5–5.1)
SODIUM: 135 meq/L (ref 135–145)

## 2017-03-24 NOTE — Progress Notes (Signed)
Subjective:    Patient ID: [name] [name], female    DOB: 1932-03-19, 83 y.o.   MRN: [id number]  CC: [name] is a 83 y.o. female who presents today for an acute visit.    HPI: CC: hypoglycemic episodes daily, 'more so in afternoon' for past week, unchanged.  Responds to food, orange juice. One time gave glucagon as she wouldn't drink or eat.   Caregiver/ great granddaughter gives history.  Son lives with patient and another son plans to come move his mother to [place] in a couple of weeks. Per granddaughter present today, he plans to have her establish with new PCP, endocrinologist.    dced from rehab 2 weeks ago. Report 'highest' BG after eating 230.   Eating is erratic, especially lunch. Can take an hour to eat and even skips meals.   Prior to coming here  BG 100 and given snack.    Episode of 75 yesterday which responded to crackers and came back to 140  She takes Humalog 10u w/bkfst, 6u w/lunch, and 10u w/dinner plus s/s; Lantus 24u sq q hs; Metformin 500mg  po bid.  Recently lantus increased to 24 units by rehab facility and granddaughter had been giving 18 units as worried over lows.   FBG- 53,46, 206,   Before lunch- 137,249, 134, 177  Before Dinner- 48 ( glucagon given),78, 44 ( came up to 159 with food), 98,46   H/o cva, dementia  Follows with Dr Nicolasa Ducking; f/u with Dr Casimiro Needle geropsych.  Hospital admission for AMS 01/2017; DKA, acute on chronic renal failure, coffee ground emesis ( no further follow up). dced to in patient rehab.    HISTORY:  Past Medical History:  Diagnosis Date  . Anxiety state, unspecified   . CAD (coronary artery disease)   . Cancer [facility])    2004 Right breast, found on mammogram, radiation therapy, Dr. Bryson Ha, uterine cancer,   . COPD (chronic obstructive pulmonary disease) ([place])   . Diabetes mellitus   . Esophageal reflux   . Heart burn   . Hyperlipidemia   . IBS (irritable bowel syndrome)   . Myocardial infarction [facility])    Cath  negative except for 40% occlusion LAD.  Pt not candidate for betablocker or ACEI because of hypotension  . OSA (obstructive sleep apnea)   . Osteoporosis 02/21/09   DEXA scan showed osteoporosis with left femur T-score -2.8.  Marland Kitchen Presbyacusis   . Stroke ([place])   . Thyroid disease    Hypothyroid  . Vitamin D deficiency    Past Surgical History:  Procedure Laterality Date  . ABDOMINAL HYSTERECTOMY     uterine cancer  . BREAST SURGERY    . COLON SURGERY  2013   done at [facility]   Family History  Problem Relation Age of Onset  . Diabetes Mother   . Heart attack Father     Allergies: Penicillins Current Outpatient Prescriptions on File Prior to Visit  Medication Sig Dispense Refill  . aspirin EC 81 MG tablet Take 1 tablet (81 mg total) by mouth daily. 90 tablet 3  . atorvastatin (LIPITOR) 80 MG tablet Take 1 tablet (80 mg total) by mouth daily. 90 tablet 3  . clopidogrel (PLAVIX) 75 MG tablet TAKE 1 TABLET(75 MG) BY MOUTH DAILY 90 tablet 3  . fluticasone (FLONASE) 50 MCG/ACT nasal spray Place 2 sprays into both nostrils daily. 16 g 6  . glucagon 1 MG injection Inject 1 mg into the vein once as needed.    Marland Kitchen  insulin glargine (LANTUS) 100 UNIT/ML injection Inject 0.22 mLs (22 Units total) into the skin daily. 10 mL 11  . insulin lispro (HUMALOG) 100 UNIT/ML injection Inject 6-10 Units into the skin 3 (three) times daily before meals. take 10 units at breakfast, 6 units at lunch, 10 units at supper.    . levothyroxine (SYNTHROID, LEVOTHROID) 100 MCG tablet TAKE 1 TABLET(100 MCG) BY MOUTH DAILY 90 tablet 3  . metFORMIN (GLUCOPHAGE) 500 MG tablet TAKE 1 TABLET(500 MG) BY MOUTH TWICE DAILY WITH A MEAL 180 tablet 1  . midodrine (PROAMATINE) 10 MG tablet Take 1 tablet (10 mg total) by mouth 3 (three) times daily with meals. 90 tablet 1  . pantoprazole (PROTONIX) 40 MG tablet Take 1 tablet (40 mg total) by mouth 2 (two) times daily. 180 tablet 3  . sertraline (ZOLOFT) 25 MG tablet Take 1 tablet (25 mg  total) by mouth daily after breakfast. 30 tablet 1   No current facility-administered medications on file prior to visit.     Social History  Substance Use Topics  . Smoking status: Never Smoker  . Smokeless tobacco: Never Used  . Alcohol use No    Review of Systems  Constitutional: Negative for chills and fever.  Respiratory: Negative for cough.   Cardiovascular: Negative for chest pain and palpitations.  Gastrointestinal: Negative for nausea and vomiting.      Objective:    BP 120/60   Pulse 86   Temp 98.1 F (36.7 C) (Oral)   Ht 5\' 6"  (1.676 m)   SpO2 93%    Physical Exam  Constitutional: She appears well-developed and well-nourished.  Eyes: Conjunctivae are normal.  Cardiovascular: Normal rate, regular rhythm, normal heart sounds and normal pulses.   Pulmonary/Chest: Effort normal and breath sounds normal. [name] no wheezes. [name] no rhonchi. [name] no rales.  Neurological: She is alert.  Alert to person and place.   Skin: Skin is warm and dry.  Psychiatric: [name] a normal mood and affect. Her speech is normal and behavior is normal. Thought content normal.  Vitals reviewed.      Assessment & Plan:   Problem List Items Addressed This Visit      Cardiovascular and Mediastinum   DM (diabetes mellitus) type II uncontrolled, periph vascular disorder ([place]) - Primary    Hypoglycemic episodes, seem to be more fasting and also at dinner time. Concern with comorbidities- dementia and erratic eating on insulin therapy.  Last a1c 8.8. For now, safest measure was to reduce insulin drastically. Advised to reduce lantus 9 units in the morning and HOLD all prandial insulin for now. grandduaghter will bring me BG log this week and we will go from there. She will me know if any low's.  Also advised granddaughter to follow with social worker as patient was supposed to see geropsy at hospital discharge. She states she will hold on any future appointments since the plan is to  take patient to [facility] in a couple of weeks.        Relevant Orders   Basic metabolic panel        I am having Ms. Simonian maintain her pantoprazole, aspirin EC, atorvastatin, metFORMIN, glucagon, clopidogrel, levothyroxine, fluticasone, insulin lispro, midodrine, sertraline, and insulin glargine.   No orders of the defined types were placed in this encounter.   Return precautions given.   Risks, benefits, and alternatives of the medications and treatment plan prescribed today were discussed, and patient expressed understanding.   Education  regarding symptom management and diagnosis given to patient on AVS.  Continue to follow with [name], DO for routine health maintenance.   [name] [name] and I agreed with plan.   Mable Paris, FNP

## 2017-03-24 NOTE — Patient Instructions (Addendum)
You were supposed to see geropsych Dr Casimiro Needle in [place]; As discussed, please call social work, Teacher, music, to schedule if patient does not move to [place].   We must decrease insulin.  Cut Lantus in HALF - 9 units in the morning as you have been doing.  HOLD meal time insulin for now.   Please drop off blood sugars after 2-3 days. Call if any LOWs    Hypoglycemia Hypoglycemia is when the sugar (glucose) level in the blood is too low. Symptoms of low blood sugar may include:  Feeling: ? Hungry. ? Worried or nervous (anxious). ? Sweaty and clammy. ? Confused. ? Dizzy. ? Sleepy. ? Sick to your stomach (nauseous).  Having: ? A fast heartbeat. ? A headache. ? A change in your vision. ? Jerky movements that you cannot control (seizure). ? Nightmares. ? Tingling or no feeling (numbness) around the mouth, lips, or tongue.  Having trouble with: ? Talking. ? Paying attention (concentrating). ? Moving (coordination). ? Sleeping.  Shaking.  Passing out (fainting).  Getting upset easily (irritability).  Low blood sugar can happen to people who have diabetes and people who do not have diabetes. Low blood sugar can happen quickly, and it can be an emergency. Treating Low Blood Sugar Low blood sugar is often treated by eating or drinking something sugary right away. If you can think clearly and swallow safely, follow the 15:15 rule:  Take 15 grams of a fast-acting carb (carbohydrate). Some fast-acting carbs are: ? 1 tube of glucose gel. ? 3 sugar tablets (glucose pills). ? 6-8 pieces of hard candy. ? 4 oz (120 mL) of fruit juice. ? 4 oz (120 mL) of regular (not diet) soda.  Check your blood sugar 15 minutes after you take the carb.  If your blood sugar is still at or below 70 mg/dL (3.9 mmol/L), take 15 grams of a carb again.  If your blood sugar does not go above 70 mg/dL (3.9 mmol/L) after 3 tries, get help right away.  After your blood sugar goes back to normal, eat  a meal or a snack within 1 hour.  Treating Very Low Blood Sugar If your blood sugar is at or below 54 mg/dL (3 mmol/L), you have very low blood sugar (severe hypoglycemia). This is an emergency. Do not wait to see if the symptoms will go away. Get medical help right away. Call your local emergency services (911 in the U.S.). Do not drive yourself to the hospital. If you have very low blood sugar and you cannot eat or drink, you may need a glucagon shot (injection). A family member or friend should learn how to check your blood sugar and how to give you a glucagon shot. Ask your doctor if you need to have a glucagon shot kit at home. Follow these instructions at home: General instructions  Avoid any diets that cause you to not eat enough food. Talk with your doctor before you start any new diet.  Take over-the-counter and prescription medicines only as told by your doctor.  Limit alcohol to no more than 1 drink per day for nonpregnant women and 2 drinks per day for men. One drink equals 12 oz of beer, 5 oz of wine, or 1 oz of hard liquor.  Keep all follow-up visits as told by your doctor. This is important. If You Have Diabetes:   Make sure you know the symptoms of low blood sugar.  Always keep a source of sugar with you, such as: ? Sugar. ?  Sugar tablets. ? Glucose gel. ? Fruit juice. ? Regular soda (not diet soda). ? Milk. ? Hard candy. ? Honey.  Take your medicines as told.  Follow your exercise and meal plan. ? Eat on time. Do not skip meals. ? Follow your sick day plan when you cannot eat or drink normally. Make this plan ahead of time with your doctor.  Check your blood sugar as often as told by your doctor. Always check before and after exercise.  Share your diabetes care plan with: ? Your work or school. ? People you live with.  Check your pee (urine) for ketones: ? When you are sick. ? As told by your doctor.  Carry a card or wear jewelry that says you have  diabetes. If You Have Low Blood Sugar From Other Causes:   Check your blood sugar as often as told by your doctor.  Follow instructions from your doctor about what you cannot eat or drink. Contact a doctor if:  You have trouble keeping your blood sugar in your target range.  You have low blood sugar often. Get help right away if:  You still have symptoms after you eat or drink something sugary.  Your blood sugar is at or below 54 mg/dL (3 mmol/L).  You have jerky movements that you cannot control.  You pass out. These symptoms may be an emergency. Do not wait to see if the symptoms will go away. Get medical help right away. Call your local emergency services (911 in the [place] Do not drive yourself to the hospital. This information is not intended to replace advice given to you by your health care provider. Make sure you discuss any questions you have with your health care provider. Document Released: 10/08/2009 Document Revised: 12/20/2015 Document Reviewed: 08/17/2015 Elsevier Interactive Patient Education  [employer].

## 2017-03-24 NOTE — Patient Outreach (Signed)
9:16 am - Received a call from [name] [name] (pt's great granddaughter, on consent) requesting to cancel today's home visit, reports pt to see NP at PCP this am, HIPAA  Identifiers verified on pt during call.   [name] [name] reports pt's sugars have been dropping between lunch and sugar, called PCP office 03/20/17 unable to see pt then, so called Endocrinologist office/was told earliest pt could be seen is 04/13/17 so called PCP back yesterday- pt has an appointment to see NP at 11:30 am today.   [name] [name] reports she is not giving pt insulin if sugars are <120 or they will drop, only giving pt 8 units (Humalog) instead of 10.   [name] [name] reports yesterday, 2 hours after pt ate sugar dropped to 51, gave her orange juice/sugar did not come up so gave her a [employer] treat- at 4:35 pm rechecked- result was 118, rechecked again at 5:21pm- result was 118, gave pt dinner/no insulin which she is glad as this am sugar was 154.  [name] [name] reports pt's Dementia is getting worse, think reason sugar is dropping is because pt takes so long to eat - 1.5 hours with prompting.   [name] [name] reports she is going to see if pt can either see a different Endocrinologist  To which RN CM suggested to see if NP can get pt an earlier appointment with current Endocrinologist to which [name] [name] agreed to ask.   [name] [name] reports son [name] [name] from out of town will be here 03/30/17.    Plan:  As discussed with [name] [name], home visit with pt rescheduled for next week.    [name]   [place] [facility]  [phone]

## 2017-03-24 NOTE — Telephone Encounter (Signed)
[name] [name], granddaughter [phone]) returned phone call. Read the follow CBGs to me:   8/23  FBG - 46 (skipped long-acting insulin, skipped breakfast bolus) Before Lunch - 249 (gave lunch time bolus) Before Supper - 51 (skipped dinner bolus)  8/24 FBG - 206 (gave basal + bolus) Lunch - 134 (gave bolus) 2 hrs after lunch - 44 (no bolus) Dinner - 159 (gave bolus) QHS - 72  8/25 - granddaughter not at house - unaware of insulin doses Lunch - [place] - 98  8/26 - granddaughter not at house - unaware of insulin doses Dinner - 46  8/27 FBG - 42 (held basal + bolus) 1 hr after breakfast - 140 Lunch - 182 (gave bolus) 4PM - 44 Dinner - 118 (held bolus)  8/28  FBG - 154 (held basal+bolus) While in doctor's office - 158 Before lunch - 265 (gave bolus) 2 hrs after lunch - 213  Before hospitalization - Lantus 22 qHS, Humalog 10 units breakfast/6 lunch/10 dinner + SSI.  Rehab - Lantus 24 units qAM, Humalog 10 units breakfast/6 lunch/10 dinner + SSI - was having lows Recently - Lantus 20 units qAM, Humalog 10 units breakfast/6 lunch/10 dinner + SSI - was having lows  Starting last week - Lantus 18 units, Humalog 10 units breakfast/6 lunch/10 dinner + SSI  [name] [name] (granddaughter) states that after hospitalization patient is a "totally different person" and eating habits have drastically changed. Per emily, pt's son is planning to bring patient down to [facility] with him.    I agree with Mable Paris, NP's plan to d/c bolus and decrease lantus to 9 units daily. Patient is having lows in fasting states and after boluses at times, likely 2/2 drastically changed eating habits. [name] [name] is to call if patient continues to have low CBGs in the next three days. To bring CBG log to office on Friday. I am happy to see patient in pharmacy clinic if needed.   Carlean Jews, Pharm.D. PGY2 Ambulatory Care Pharmacy Resident Phone: [phone]

## 2017-03-24 NOTE — Telephone Encounter (Signed)
[name] [name],  May need your help with this cause.   Quite uncomfortable with ANY lows with 83 year old with dementia.   [name] [name] had been working with her and Higher education careers adviser.   I see her at 11:30 today

## 2017-03-24 NOTE — Progress Notes (Signed)
Pre visit review using our clinic review tool, if applicable. No additional management support is needed unless otherwise documented below in the visit note. 

## 2017-03-24 NOTE — Patient Outreach (Signed)
Received a call from Criss Alvine (great granddaughter, on consent form) reporting  on pt's visit today with NP, HIPAA identifiers verified on pt.     [name] reports pt's insulin was cut in half, was instructed to keep a close eye on pt for next 3 days, call and let NP know how pt is doing.   [name] reports she called [name] (pt's son)who is already in the process of finding pt a  Garment/textile technologist, Endocrinologist  in [place], states [name] is the Product/process development scientist of a rehab, can keep a closer watch on pt in [place].  [name] reports insulin changes for pt include: Lantus 8 units, Humalog - pt to continue with 10 units at breakfast, none at lunch, 10 units at dinner.     Plan:  As discussed with [name], RN CM to follow up with pt next week- home visit.    [name]   [facility]  [phone]

## 2017-03-24 NOTE — Telephone Encounter (Signed)
No worries!  Her granddaughter bring a log back  I went drastic ( lows are so scary when her eating is erratic). If you see my note, I wrote some numbers in the HPI  Yes, please call her- you may have to call granddaughter who was here today as patient is poor  historian

## 2017-03-24 NOTE — Patient Outreach (Signed)
[facility]) Care Management  03/24/2017  [name] [name] 15-Sep-1931 [id number]   Phone call from patient's great grand daughter Criss Alvine, on [facility] consent. Per [name], patient's son has decided to move patient with him to [place]. Per [name], patient's son will be traveling to [facility] on 03/28/17 and will plan to move patient on 03/30/17. Per [name], patient's son is currently working on transition plans.  Patient seen by her primary care doctor today, per [name] patient's insulin adjusted as well as recommendation for patient to follow up with a psychiatrist made.  Patient to be closed to social work at this time due to plan for patient to move to [place] with her son.     Sheralyn Boatman [facility] Management [phone]

## 2017-03-24 NOTE — Assessment & Plan Note (Addendum)
Hypoglycemic episodes, seem to be more fasting and also at dinner time. Concern with comorbidities- dementia and erratic eating on insulin therapy.  Last a1c 8.8. For now, safest measure was to reduce insulin drastically. Advised to reduce lantus 9 units in the morning and HOLD all prandial insulin for now. grandduaghter will bring me BG log this week and we will go from there. She will me know if any low's.  Also advised granddaughter to follow with social worker as patient was supposed to see geropsy at hospital discharge. She states she will hold on any future appointments since the plan is to take patient to [facility] in a couple of weeks.

## 2017-03-24 NOTE — Telephone Encounter (Signed)
Would need to know more about patterns of hypoglycemia. If going low close to next meal, overnight, or first thing in the morning, would definitely need to decrease basal. Would consider decreasing bolus as well if a pattern is distinguished between two meals. Sorry I didn't see this sooner. I agree with your conservative approach - that is scary! Would you like me to see her?  Chrys Racer

## 2017-03-24 NOTE — Telephone Encounter (Signed)
Called patient at request of Mable Paris, NP - No answer. Left HIPAA-compliant VM

## 2017-03-27 ENCOUNTER — Telehealth: Payer: Self-pay | Admitting: Family Medicine

## 2017-03-27 NOTE — Telephone Encounter (Signed)
Spoke with patient's granddaughter. Discussed that some of her sugars are still quite elevated. She reports that the patient feels significantly better since having her insulin dose decreased. We will have her increase the Lantus to 11 units in the morning and continue the Humalog as is. Advised that if she develops any low sugars with this they can go back to the 9 units and contact us over the weekend. Discussed contacting us next week to let us know what her sugars have been doing.

## 2017-03-27 NOTE — Telephone Encounter (Signed)
On Lantus 9 units in am, Humalog 10 units in am, 10 in pm.   See blood sugar reading below. Call [name] grandaughter at [phone]

## 2017-03-27 NOTE — Telephone Encounter (Signed)
Pt 's daughter was told to call in pts sugar results,  Tuesday, 03/24/17; morning 154. Lunch time 265, dinner 214.  Wednesday, 03/25/17 morning 533, lunch time 220  Thursday, 03/26/17 morning 459, lunch time 267, 2 hours after lunch 185 and dinner time 212.  Friday, 03/27/17 morning 381, lunch time 92.

## 2017-03-31 ENCOUNTER — Other Ambulatory Visit: Payer: Self-pay | Admitting: *Deleted

## 2017-03-31 ENCOUNTER — Encounter: Payer: Self-pay | Admitting: *Deleted

## 2017-03-31 NOTE — Patient Outreach (Signed)
[facility]) Care Management   03/31/2017  [name] [name] [birth year] [id number]  [name] [name] is an 83 y.o. female  Subjective:   [name] reports on call to PCP office 8/31- pt's sugar elevated, received a call Back to increase Lantus to 11 units (takes in am).  [name] reports she was not with pt past 3 days- Pt's sugar on Saturday at 3:30 pm was >600 came down to 393 at dinner, Sunday 549 in am,  Later in day 316, Monday 314 late am/dinner 301. [name] reports pt's sugar this am was 310,  Lantus and Humalog insulin given, at lunch sugar dropped to 55 to which she feels is coming  From pt taking so long to eat (took an hour to eat 2 eggs/turkey bacon).  [name] reports pt's  Son from [place] is suppose to come later this week, plans to take pt back with him, working  On setting up  doctors for pt.    Objective:   Vitals:   03/31/17 1453  BP: (!) 102/50  Pulse: 73  Resp: 20  SpO2: 98%    ROS  Physical Exam  Constitutional: She appears well-developed.  Cardiovascular: Normal rate, regular rhythm and normal heart sounds.   Respiratory: Effort normal and breath sounds normal.  GI: Soft. Bowel sounds are normal.  Musculoskeletal: She exhibits no edema.  Neurological: She is alert.  Pt cannot remember dob, address   Skin: Skin is warm and dry.  Psychiatric: She has a normal mood and affect. Her behavior is normal.    Encounter Medications:   Outpatient Encounter Prescriptions as of 03/31/2017  Medication Sig Note  . aspirin EC 81 MG tablet Take 1 tablet (81 mg total) by mouth daily.   Marland Kitchen atorvastatin (LIPITOR) 80 MG tablet Take 1 tablet (80 mg total) by mouth daily.   . clopidogrel (PLAVIX) 75 MG tablet TAKE 1 TABLET(75 MG) BY MOUTH DAILY   . fluticasone (FLONASE) 50 MCG/ACT nasal spray Place 2 sprays into both nostrils daily. 03/16/2017: As needed.   Marland Kitchen glucagon 1 MG injection Inject 1 mg into the vein once as needed. 03/16/2017: Available if needed.   . insulin  glargine (LANTUS) 100 UNIT/ML injection Inject 0.22 mLs (22 Units total) into the skin daily. 03/16/2017: Pt taking in am.   . insulin lispro (HUMALOG) 100 UNIT/ML injection Inject 6-10 Units into the skin 3 (three) times daily before meals. take 10 units at breakfast, 6 units at lunch, 10 units at supper. 01/19/2017: With additional sliding scale 200-249 1 250-299 3 300-349 5 350-399 7 >400 9      . levothyroxine (SYNTHROID, LEVOTHROID) 100 MCG tablet TAKE 1 TABLET(100 MCG) BY MOUTH DAILY   . metFORMIN (GLUCOPHAGE) 500 MG tablet TAKE 1 TABLET(500 MG) BY MOUTH TWICE DAILY WITH A MEAL   . midodrine (PROAMATINE) 10 MG tablet Take 1 tablet (10 mg total) by mouth 3 (three) times daily with meals.   . pantoprazole (PROTONIX) 40 MG tablet Take 1 tablet (40 mg total) by mouth 2 (two) times daily.   . sertraline (ZOLOFT) 25 MG tablet Take 1 tablet (25 mg total) by mouth daily after breakfast.    No facility-administered encounter medications on file as of 03/31/2017.     Functional Status:   In your present state of health, do you have any difficulty performing the following activities: 02/12/2017 01/13/2017  Hearing? Tempie Donning  Vision? N N  Difficulty concentrating or making decisions? Tempie Donning  Walking or climbing stairs?  Y Y  Dressing or bathing? Y N  Doing errands, shopping? N Y  Conservation officer, nature and eating ? - -  Comment - -  Using the Toilet? - -  In the past six months, have you accidently leaked urine? - -  Do you have problems with loss of bowel control? - -  Managing your Medications? - -  Managing your Finances? - -  Comment - -  Housekeeping or managing your Housekeeping? - -  Comment - -  Some recent data might be hidden    Fall/Depression Screening:    Fall Risk  02/03/2017 12/15/2016 08/15/2016  Falls in the past year? (No Data) No No  Comment No new falls, per report of patient's caregiver/ granddaughter [name], on [facility] CM written consent - -  Risk for fall due to : - - -   PHQ 2/9 Scores  12/15/2016 11/27/2016 08/15/2016 04/01/2016 01/04/2016 08/15/2014 07/26/2012  PHQ - 2 Score 1 3 0 0 0 0 0  PHQ- 9 Score - 8 - - - - -    Assessment:  Pleasant 83 year old female, forgetful, could not recall dob/address, lives with son.  [name] (pt's great granddaughter,caregiver) present during home visit.   Lungs clear, no complaints of pain, sob.   DM: per [name] reports pt's  Blood sugar this am 310, insulins given/recheck at lunch time - 55.          Discussed with [name] to include a good carb at breakfast for pt.     Plan:  As discussed with pt, plan to call [name] next week to see if she moved with son to [place].              Plan to send Dr. Lacinda Axon 03/31/17 home visit encounter.    THN CM Care Plan Problem One     Most Recent Value  Care Plan Problem One  Risk for readmission related to recent SNF,hospital discharge for AMS, DM   Role Documenting the Problem One  Care Management [place] for Problem One  Active  THN Long Term Goal   Pt would not readmit to SNF or hospital within the next 31 days   THN Long Term Goal Start Date  03/16/17  Interventions for Problem One Long Term Goal  Home visit done- discussed with [name] (caregiver) pt's status- appetite, sugars   THN CM Short Term Goal #1   Pt would take all medications as ordered for the next 30 days   THN CM Short Term Goal #1 Start Date  03/16/17  Interventions for Short Term Goal #1  Reviewed with [name] (does pill planner) pt's medications, insulin changes   THN CM Short Term Goal #2   Pt would keep all MD appointments in the next 30 days   THN CM Short Term Goal #2 Start Date  03/16/17  Interventions for Short Term Goal #2  Discussed with [name] son's plan to move pt to [place], f/u with MDs down there.      [name]   [place] [facility]  [phone]

## 2017-04-01 ENCOUNTER — Ambulatory Visit: Payer: Self-pay | Admitting: *Deleted

## 2017-04-05 ENCOUNTER — Inpatient Hospital Stay: Payer: Medicare Other

## 2017-04-05 ENCOUNTER — Emergency Department: Payer: Medicare Other

## 2017-04-05 ENCOUNTER — Encounter: Payer: Self-pay | Admitting: Emergency Medicine

## 2017-04-05 ENCOUNTER — Inpatient Hospital Stay
Admission: EM | Admit: 2017-04-05 | Discharge: 2017-04-07 | DRG: 637 | Disposition: A | Discharge status: Skilled Nursing Facility | Payer: Medicare Other | Attending: Internal Medicine | Admitting: Internal Medicine

## 2017-04-05 DIAGNOSIS — Z7951 Long term (current) use of inhaled steroids: Secondary | ICD-10-CM

## 2017-04-05 DIAGNOSIS — H9110 Presbycusis, unspecified ear: Secondary | ICD-10-CM | POA: Diagnosis not present

## 2017-04-05 DIAGNOSIS — R9431 Abnormal electrocardiogram [ECG] [EKG]: Secondary | ICD-10-CM | POA: Diagnosis not present

## 2017-04-05 DIAGNOSIS — K219 Gastro-esophageal reflux disease without esophagitis: Secondary | ICD-10-CM | POA: Diagnosis present

## 2017-04-05 DIAGNOSIS — Z7902 Long term (current) use of antithrombotics/antiplatelets: Secondary | ICD-10-CM

## 2017-04-05 DIAGNOSIS — Z794 Long term (current) use of insulin: Secondary | ICD-10-CM

## 2017-04-05 DIAGNOSIS — I10 Essential (primary) hypertension: Secondary | ICD-10-CM | POA: Diagnosis not present

## 2017-04-05 DIAGNOSIS — Z8542 Personal history of malignant neoplasm of other parts of uterus: Secondary | ICD-10-CM | POA: Diagnosis not present

## 2017-04-05 DIAGNOSIS — I639 Cerebral infarction, unspecified: Secondary | ICD-10-CM | POA: Diagnosis not present

## 2017-04-05 DIAGNOSIS — I2510 Atherosclerotic heart disease of native coronary artery without angina pectoris: Secondary | ICD-10-CM | POA: Diagnosis not present

## 2017-04-05 DIAGNOSIS — E1151 Type 2 diabetes mellitus with diabetic peripheral angiopathy without gangrene: Secondary | ICD-10-CM | POA: Diagnosis not present

## 2017-04-05 DIAGNOSIS — E785 Hyperlipidemia, unspecified: Secondary | ICD-10-CM | POA: Diagnosis present

## 2017-04-05 DIAGNOSIS — I959 Hypotension, unspecified: Secondary | ICD-10-CM | POA: Diagnosis not present

## 2017-04-05 DIAGNOSIS — Z90710 Acquired absence of both cervix and uterus: Secondary | ICD-10-CM

## 2017-04-05 DIAGNOSIS — I252 Old myocardial infarction: Secondary | ICD-10-CM

## 2017-04-05 DIAGNOSIS — K589 Irritable bowel syndrome without diarrhea: Secondary | ICD-10-CM | POA: Diagnosis present

## 2017-04-05 DIAGNOSIS — R4182 Altered mental status, unspecified: Secondary | ICD-10-CM | POA: Diagnosis present

## 2017-04-05 DIAGNOSIS — E1165 Type 2 diabetes mellitus with hyperglycemia: Principal | ICD-10-CM | POA: Diagnosis present

## 2017-04-05 DIAGNOSIS — G4733 Obstructive sleep apnea (adult) (pediatric): Secondary | ICD-10-CM | POA: Diagnosis not present

## 2017-04-05 DIAGNOSIS — I219 Acute myocardial infarction, unspecified: Secondary | ICD-10-CM | POA: Diagnosis not present

## 2017-04-05 DIAGNOSIS — J449 Chronic obstructive pulmonary disease, unspecified: Secondary | ICD-10-CM | POA: Diagnosis not present

## 2017-04-05 DIAGNOSIS — E876 Hypokalemia: Secondary | ICD-10-CM | POA: Diagnosis not present

## 2017-04-05 DIAGNOSIS — N179 Acute kidney failure, unspecified: Secondary | ICD-10-CM | POA: Diagnosis present

## 2017-04-05 DIAGNOSIS — M6281 Muscle weakness (generalized): Secondary | ICD-10-CM | POA: Diagnosis present

## 2017-04-05 DIAGNOSIS — E119 Type 2 diabetes mellitus without complications: Secondary | ICD-10-CM | POA: Diagnosis not present

## 2017-04-05 DIAGNOSIS — G9341 Metabolic encephalopathy: Secondary | ICD-10-CM | POA: Diagnosis present

## 2017-04-05 DIAGNOSIS — Z8673 Personal history of transient ischemic attack (TIA), and cerebral infarction without residual deficits: Secondary | ICD-10-CM

## 2017-04-05 DIAGNOSIS — E039 Hypothyroidism, unspecified: Secondary | ICD-10-CM | POA: Diagnosis present

## 2017-04-05 DIAGNOSIS — D638 Anemia in other chronic diseases classified elsewhere: Secondary | ICD-10-CM | POA: Diagnosis not present

## 2017-04-05 DIAGNOSIS — Z853 Personal history of malignant neoplasm of breast: Secondary | ICD-10-CM | POA: Diagnosis not present

## 2017-04-05 DIAGNOSIS — F0150 Vascular dementia without behavioral disturbance: Secondary | ICD-10-CM | POA: Diagnosis present

## 2017-04-05 DIAGNOSIS — R739 Hyperglycemia, unspecified: Secondary | ICD-10-CM | POA: Diagnosis not present

## 2017-04-05 DIAGNOSIS — F411 Generalized anxiety disorder: Secondary | ICD-10-CM | POA: Diagnosis present

## 2017-04-05 DIAGNOSIS — E860 Dehydration: Secondary | ICD-10-CM | POA: Diagnosis not present

## 2017-04-05 DIAGNOSIS — E079 Disorder of thyroid, unspecified: Secondary | ICD-10-CM | POA: Diagnosis not present

## 2017-04-05 DIAGNOSIS — Z7982 Long term (current) use of aspirin: Secondary | ICD-10-CM

## 2017-04-05 DIAGNOSIS — M810 Age-related osteoporosis without current pathological fracture: Secondary | ICD-10-CM | POA: Diagnosis not present

## 2017-04-05 DIAGNOSIS — E569 Vitamin deficiency, unspecified: Secondary | ICD-10-CM | POA: Diagnosis not present

## 2017-04-05 DIAGNOSIS — G9340 Encephalopathy, unspecified: Secondary | ICD-10-CM | POA: Diagnosis not present

## 2017-04-05 DIAGNOSIS — G9382 Brain death: Secondary | ICD-10-CM | POA: Diagnosis not present

## 2017-04-05 LAB — CBC
HCT: 31.6 % — ABNORMAL LOW (ref 35.0–47.0)
Hemoglobin: 10.8 g/dL — ABNORMAL LOW (ref 12.0–16.0)
MCH: 32.4 pg (ref 26.0–34.0)
MCHC: 34.2 g/dL (ref 32.0–36.0)
MCV: 94.9 fL (ref 80.0–100.0)
PLATELETS: 310 10*3/uL (ref 150–440)
RBC: 3.33 MIL/uL — ABNORMAL LOW (ref 3.80–5.20)
RDW: 15.3 % — AB (ref 11.5–14.5)
WBC: 6 10*3/uL (ref 3.6–11.0)

## 2017-04-05 LAB — URINALYSIS, COMPLETE (UACMP) WITH MICROSCOPIC
Bacteria, UA: NONE SEEN
Bilirubin Urine: NEGATIVE
Glucose, UA: >=500 mg/dL — AB
Hgb urine dipstick: NEGATIVE
Ketones, ur: 20 mg/dL — AB
Leukocytes, UA: NEGATIVE
Nitrite: NEGATIVE
PH: 5 (ref 5.0–8.0)
Protein, ur: NEGATIVE mg/dL
RBC / HPF: NONE SEEN RBC/hpf (ref 0–5)
SPECIFIC GRAVITY, URINE: 1.016 (ref 1.005–1.030)
Squamous Epithelial / HPF: NONE SEEN

## 2017-04-05 LAB — GLUCOSE, CAPILLARY
GLUCOSE-CAPILLARY: 351 mg/dL — AB (ref 65–99)
GLUCOSE-CAPILLARY: 166 mg/dL — AB (ref 65–99)
Glucose-Capillary: 156 mg/dL — ABNORMAL HIGH (ref 65–99)
Glucose-Capillary: 253 mg/dL — ABNORMAL HIGH (ref 65–99)

## 2017-04-05 LAB — BASIC METABOLIC PANEL
Anion gap: 17 — ABNORMAL HIGH (ref 5–15)
BUN: 23 mg/dL — AB (ref 6–20)
CHLORIDE: 98 mmol/L — AB (ref 101–111)
CO2: 20 mmol/L — ABNORMAL LOW (ref 22–32)
Calcium: 9.8 mg/dL (ref 8.9–10.3)
Creatinine, Ser: 1.49 mg/dL — ABNORMAL HIGH (ref 0.44–1.00)
GFR calc Af Amer: 36 mL/min — ABNORMAL LOW (ref >60)
GFR calc non Af Amer: 31 mL/min — ABNORMAL LOW (ref >60)
GLUCOSE: 340 mg/dL — AB (ref 65–99)
POTASSIUM: 3.2 mmol/L — AB (ref 3.5–5.1)
Sodium: 135 mmol/L (ref 135–145)

## 2017-04-05 MED ORDER — POTASSIUM CHLORIDE CRYS ER 20 MEQ PO TBCR
40.0000 meq | EXTENDED_RELEASE_TABLET | Freq: Once | ORAL | Status: AC
  Administered 2017-04-05: 40 meq via ORAL
  Filled 2017-04-05: qty 2

## 2017-04-05 MED ORDER — SODIUM CHLORIDE 0.9 % IV BOLUS (SEPSIS)
1000.0000 mL | Freq: Once | INTRAVENOUS | Status: AC
  Administered 2017-04-05: 1000 mL via INTRAVENOUS

## 2017-04-05 MED ORDER — ONDANSETRON HCL 4 MG PO TABS: 4.0000 mg | ORAL_TABLET | Freq: Four times a day (QID) | ORAL | Status: DC | PRN

## 2017-04-05 MED ORDER — ASPIRIN EC 81 MG PO TBEC
81.0000 mg | DELAYED_RELEASE_TABLET | Freq: Every day | ORAL | Status: DC
Start: 2017-04-06 — End: 2017-04-07
  Administered 2017-04-06 – 2017-04-07 (×2): 81 mg via ORAL
  Filled 2017-04-05 (×2): qty 1

## 2017-04-05 MED ORDER — ONDANSETRON HCL 4 MG/2ML IJ SOLN
4.0000 mg | Freq: Four times a day (QID) | INTRAMUSCULAR | Status: DC | PRN
Start: 2017-04-05 — End: 2017-04-07

## 2017-04-05 MED ORDER — SODIUM CHLORIDE 0.9 % IV SOLN
Freq: Once | INTRAVENOUS | Status: AC
Start: 2017-04-05 — End: 2017-04-05
  Administered 2017-04-05: 13:00:00 via INTRAVENOUS

## 2017-04-05 MED ORDER — INSULIN REGULAR HUMAN 100 UNIT/ML IJ SOLN
10.0000 [IU] | Freq: Once | INTRAMUSCULAR | Status: DC
  Filled 2017-04-05: qty 0.1

## 2017-04-05 MED ORDER — MIDODRINE HCL 5 MG PO TABS
10.0000 mg | ORAL_TABLET | Freq: Three times a day (TID) | ORAL | Status: DC
  Administered 2017-04-05 – 2017-04-07 (×6): 10 mg via ORAL
  Filled 2017-04-05 (×8): qty 2

## 2017-04-05 MED ORDER — ACETAMINOPHEN 650 MG RE SUPP: 650.0000 mg | Freq: Four times a day (QID) | RECTAL | Status: DC | PRN

## 2017-04-05 MED ORDER — INSULIN ASPART 100 UNIT/ML ~~LOC~~ SOLN
0.0000 [IU] | Freq: Every day | SUBCUTANEOUS | Status: DC
  Administered 2017-04-05: 3 [IU] via SUBCUTANEOUS
  Filled 2017-04-05: qty 1

## 2017-04-05 MED ORDER — SODIUM CHLORIDE 0.9 % IV BOLUS (SEPSIS)
1000.0000 mL | Freq: Once | INTRAVENOUS | Status: AC
Start: 2017-04-05 — End: 2017-04-05
  Administered 2017-04-05: 1000 mL via INTRAVENOUS

## 2017-04-05 MED ORDER — ONDANSETRON HCL 4 MG/2ML IJ SOLN
4.0000 mg | Freq: Once | INTRAMUSCULAR | Status: AC
  Administered 2017-04-05: 4 mg via INTRAVENOUS
  Filled 2017-04-05: qty 2

## 2017-04-05 MED ORDER — INSULIN ASPART 100 UNIT/ML ~~LOC~~ SOLN
0.0000 [IU] | Freq: Three times a day (TID) | SUBCUTANEOUS | Status: DC
  Administered 2017-04-05: 2 [IU] via SUBCUTANEOUS
  Administered 2017-04-06: 7 [IU] via SUBCUTANEOUS
  Administered 2017-04-06 (×2): 5 [IU] via SUBCUTANEOUS
  Administered 2017-04-07: 3 [IU] via SUBCUTANEOUS
  Administered 2017-04-07: 5 [IU] via SUBCUTANEOUS
  Filled 2017-04-05 (×6): qty 1

## 2017-04-05 MED ORDER — SERTRALINE HCL 50 MG PO TABS
25.0000 mg | ORAL_TABLET | Freq: Every day | ORAL | Status: DC
  Administered 2017-04-06 – 2017-04-07 (×2): 25 mg via ORAL
  Filled 2017-04-05 (×2): qty 1

## 2017-04-05 MED ORDER — LEVOTHYROXINE SODIUM 100 MCG PO TABS
100.0000 ug | ORAL_TABLET | Freq: Every day | ORAL | Status: DC
  Administered 2017-04-06 – 2017-04-07 (×2): 100 ug via ORAL
  Filled 2017-04-05 (×2): qty 1

## 2017-04-05 MED ORDER — SODIUM CHLORIDE 0.9 % IV SOLN
INTRAVENOUS | Status: DC
  Administered 2017-04-05 – 2017-04-06 (×2): via INTRAVENOUS

## 2017-04-05 MED ORDER — LOPERAMIDE HCL 2 MG PO CAPS: 4.0000 mg | ORAL_CAPSULE | ORAL | Status: DC | PRN

## 2017-04-05 MED ORDER — CLOPIDOGREL BISULFATE 75 MG PO TABS: 75.0000 mg | ORAL_TABLET | Freq: Every day | ORAL | Status: DC

## 2017-04-05 MED ORDER — INSULIN GLARGINE 100 UNIT/ML ~~LOC~~ SOLN
15.0000 [IU] | Freq: Every day | SUBCUTANEOUS | Status: DC
  Administered 2017-04-06 – 2017-04-07 (×2): 15 [IU] via SUBCUTANEOUS
  Filled 2017-04-05 (×4): qty 0.15

## 2017-04-05 MED ORDER — SENNOSIDES-DOCUSATE SODIUM 8.6-50 MG PO TABS: 1.0000 | ORAL_TABLET | Freq: Every evening | ORAL | Status: DC | PRN

## 2017-04-05 MED ORDER — METFORMIN HCL 500 MG PO TABS: 500.0000 mg | ORAL_TABLET | Freq: Two times a day (BID) | ORAL | Status: DC

## 2017-04-05 MED ORDER — ACETAMINOPHEN 325 MG PO TABS: 650.0000 mg | ORAL_TABLET | Freq: Four times a day (QID) | ORAL | Status: DC | PRN

## 2017-04-05 MED ORDER — ENOXAPARIN SODIUM 30 MG/0.3ML ~~LOC~~ SOLN
30.0000 mg | SUBCUTANEOUS | Status: DC
  Administered 2017-04-05: 30 mg via SUBCUTANEOUS
  Filled 2017-04-05: qty 0.3

## 2017-04-05 MED ORDER — POTASSIUM CHLORIDE 10 MEQ/100ML IV SOLN
10.0000 meq | Freq: Once | INTRAVENOUS | Status: AC
  Administered 2017-04-05: 10 meq via INTRAVENOUS
  Filled 2017-04-05: qty 100

## 2017-04-05 MED ORDER — ATORVASTATIN CALCIUM 20 MG PO TABS
80.0000 mg | ORAL_TABLET | Freq: Every day | ORAL | Status: DC
  Administered 2017-04-06 – 2017-04-07 (×2): 80 mg via ORAL
  Filled 2017-04-05 (×2): qty 4

## 2017-04-05 MED ORDER — CLOPIDOGREL BISULFATE 75 MG PO TABS
75.0000 mg | ORAL_TABLET | Freq: Every day | ORAL | Status: DC
  Administered 2017-04-06 – 2017-04-07 (×2): 75 mg via ORAL
  Filled 2017-04-05 (×2): qty 1

## 2017-04-05 MED ORDER — PANTOPRAZOLE SODIUM 40 MG PO TBEC
40.0000 mg | DELAYED_RELEASE_TABLET | Freq: Two times a day (BID) | ORAL | Status: DC
  Administered 2017-04-05 – 2017-04-07 (×4): 40 mg via ORAL
  Filled 2017-04-05 (×4): qty 1

## 2017-04-05 NOTE — Clinical Social Work Note (Signed)
CSW received consult for SNF placement. PT recommendation is pending. CSW will follow pending PT rec for SNF.  Santiago Bumpers, MSW, Latanya Presser [phone]

## 2017-04-05 NOTE — ED Notes (Addendum)
EDP at bedside, pt telling him that she vomited.  Pt is able to answer some orientation questions, but does not know the date and says she is in [place].

## 2017-04-05 NOTE — ED Notes (Signed)
Insulin held per hosptialist.

## 2017-04-05 NOTE — ED Provider Notes (Signed)
[facility] Emergency Department Provider Note ____________________________________________   First MD Initiated Contact with Patient 04/05/17 [phone]     (approximate)  I have reviewed the triage vital signs and the nursing notes.   HISTORY  Chief Complaint Hyperglycemia  HPI severely limited by altered mental status  HPI [name] [name] is a 83 y.o. female with a history of diabetes, other past medical history as noted below, who presents with hyperglycemia and altered mental status.  Patient states she was vomiting since this morning.  Denies other complaints.   Past Medical History:  Diagnosis Date  . Anxiety state, unspecified   . CAD (coronary artery disease)   . Cancer [facility])    2004 Right breast, found on mammogram, radiation therapy, Dr. Bryson Ha, uterine cancer,   . COPD (chronic obstructive pulmonary disease) ([place])   . Diabetes mellitus   . Esophageal reflux   . Heart burn   . Hyperlipidemia   . IBS (irritable bowel syndrome)   . Myocardial infarction [facility])    Cath negative except for 40% occlusion LAD.  Pt not candidate for betablocker or ACEI because of hypotension  . OSA (obstructive sleep apnea)   . Osteoporosis 02/21/09   DEXA scan showed osteoporosis with left femur T-score -2.8.  Marland Kitchen Presbyacusis   . Stroke ([place])   . Thyroid disease    Hypothyroid  . Vitamin D deficiency     Patient Active Problem List   Diagnosis Date Noted  . Adjustment disorder with mixed anxiety and depressed mood   . Coffee ground emesis 02/12/2017  . Acute on chronic renal failure ([place]) 01/13/2017  . Dementia, vascular 12/03/2016  . Stroke ([place]) 08/20/2016  . Peripheral vascular disease ([place]) 04/09/2016  . DKA (diabetic ketoacidoses) ([place]) 06/08/2015  . CKD stage 3 due to type 2 diabetes mellitus ([place]) 02/22/2014  . Hypothyroidism 09/23/2012  . GERD (gastroesophageal reflux disease) 09/23/2012  . Anxiety 07/26/2012  . Hyperlipidemia with target LDL  less than 70 12/03/2011  . DM (diabetes mellitus) type II uncontrolled, periph vascular disorder ([place]) 04/03/2011  . CAD (coronary artery disease) 12/13/2009    Past Surgical History:  Procedure Laterality Date  . ABDOMINAL HYSTERECTOMY     uterine cancer  . BREAST SURGERY    . COLON SURGERY  2013   done at [facility]    Prior to Admission medications   Medication Sig Start Date End Date Taking? Authorizing Provider  aspirin EC 81 MG tablet Take 1 tablet (81 mg total) by mouth daily. 04/09/16  Yes Cook, Jayce G, DO  atorvastatin (LIPITOR) 80 MG tablet Take 1 tablet (80 mg total) by mouth daily. 08/21/16  Yes Cook, Jayce G, DO  clopidogrel (PLAVIX) 75 MG tablet TAKE 1 TABLET(75 MG) BY MOUTH DAILY 01/01/17  Yes Cook, Jayce G, DO  glucagon 1 MG injection Inject 1 mg into the vein once as needed.   Yes [provider]  insulin glargine (LANTUS) 100 UNIT/ML injection Inject 0.22 mLs (22 Units total) into the skin daily. Patient taking differently: Inject 11 Units into the skin daily.  02/16/17  Yes Fritzi Mandes, MD  insulin lispro (HUMALOG) 100 UNIT/ML injection Inject 10 Units into the skin 2 (two) times daily. take 10 units at breakfast and 10 units at supper. Never give if sugar is 80 or less.   Yes [provider]  levothyroxine (SYNTHROID, LEVOTHROID) 100 MCG tablet TAKE 1 TABLET(100 MCG) BY MOUTH DAILY 01/01/17  Yes Lacinda Axon, [place] G, DO  loperamide (  IMODIUM) 2 MG capsule Take 4 mg by mouth as needed for diarrhea or loose stools.   Yes [provider]  metFORMIN (GLUCOPHAGE) 500 MG tablet TAKE 1 TABLET(500 MG) BY MOUTH TWICE DAILY WITH A MEAL 10/20/16  Yes Cook, Jayce G, DO  midodrine (PROAMATINE) 10 MG tablet Take 1 tablet (10 mg total) by mouth 3 (three) times daily with meals. 01/30/17  Yes Cook, Jayce G, DO  pantoprazole (PROTONIX) 40 MG tablet Take 1 tablet (40 mg total) by mouth 2 (two) times daily. 01/04/16  Yes Jackolyn Confer, MD  sertraline (ZOLOFT) 25 MG tablet Take 1  tablet (25 mg total) by mouth daily after breakfast. 02/17/17  Yes Fritzi Mandes, MD  fluticasone (FLONASE) 50 MCG/ACT nasal spray Place 2 sprays into both nostrils daily. Patient taking differently: Place 2 sprays into both nostrils daily as needed.  01/01/17   [name], DO    Allergies Penicillins  Family History  Problem Relation Age of Onset  . Diabetes Mother   . Heart attack Father     Social History Social History  Substance Use Topics  . Smoking status: Never Smoker  . Smokeless tobacco: Never Used  . Alcohol use No    Review of Systems Level V caveat: Unable to obtain ROS due to altered mental status    ____________________________________________   PHYSICAL EXAM:  VITAL SIGNS: ED Triage Vitals  Enc Vitals Group     BP 04/05/17 0835 (!) 107/52     Pulse Rate 04/05/17 0835 70     Resp 04/05/17 0835 (!) 22     Temp 04/05/17 0835 97.6 F (36.4 C)     Temp Source 04/05/17 0835 Oral     SpO2 04/05/17 0835 97 %     Weight 04/05/17 0816 138 lb (62.6 kg)     Height 04/05/17 0816 5\' 6"  (1.676 m)     Head Circumference --      Peak Flow --      Pain Score --      Pain Loc --      Pain Edu? --      Excl. in [place]? --     Constitutional: Alert, oriented x1, no acute distress Eyes: Conjunctivae are normal. EOMI. PERRLA. Head: Atraumatic. Nose: No congestion/rhinnorhea. Mouth/Throat: Mucous membranes are dry. Neck: Normal range of motion.  Cardiovascular: Normal rate, regular rhythm. Grossly normal heart sounds.  Good peripheral circulation. Respiratory: Normal respiratory effort.  No retractions. Trace rales to bilateral bases, lungs otherwise clear. Gastrointestinal: Soft and nontender. No distention.  Genitourinary: No CVA tenderness. Musculoskeletal: No lower extremity edema.  Extremities warm and well perfused.  Neurologic:  Motor intact in all extremities..  Skin:  Skin is warm and dry. No rash noted. Psychiatric: Mood and affect are  normal.  ____________________________________________   LABS (all labs ordered are listed, but only abnormal results are displayed)  Labs Reviewed  BASIC METABOLIC PANEL - Abnormal; Notable for the following:       Result Value   Potassium 3.2 (*)    Chloride 98 (*)    CO2 20 (*)    Glucose, Bld 340 (*)    BUN 23 (*)    Creatinine, Ser 1.49 (*)    GFR calc non Af Amer 31 (*)    GFR calc Af Amer 36 (*)    Anion gap 17 (*)    All other components within normal limits  CBC - Abnormal; Notable for the following:  RBC 3.33 (*)    Hemoglobin 10.8 (*)    HCT 31.6 (*)    RDW 15.3 (*)    All other components within normal limits  URINALYSIS, COMPLETE (UACMP) WITH MICROSCOPIC - Abnormal; Notable for the following:    Color, Urine YELLOW (*)    APPearance CLEAR (*)    Glucose, UA >=500 (*)    Ketones, ur 20 (*)    All other components within normal limits  BLOOD GAS, VENOUS - Abnormal; Notable for the following:    Acid-base deficit 5.3 (*)    All other components within normal limits  GLUCOSE, CAPILLARY - Abnormal; Notable for the following:    Glucose-Capillary 351 (*)    All other components within normal limits  HEMOGLOBIN A1C  CBG MONITORING, ED   ____________________________________________  EKG  ED ECG REPORT I, Arta Silence, the attending physician, personally viewed and interpreted this ECG.  Date: 04/05/2017 EKG Time: 9:03 Rate: 66 Rhythm: normal sinus rhythm QRS Axis: normal Intervals: normal ST/T Wave abnormalities: normal Narrative Interpretation: no evidence of acute ischemia  ____________________________________________  RADIOLOGY  Chest X ray with no acute findings.   ____________________________________________   PROCEDURES  Procedure(s) performed: No    Critical Care performed: No ____________________________________________   INITIAL IMPRESSION / ASSESSMENT AND PLAN / ED COURSE  Pertinent labs & imaging results that were  available during my care of the patient were reviewed by me and considered in my medical decision making (see chart for details).  83 year old female with history of diabetes presents with altered mental status, vomiting since this morning, and hyperglycemia. At this time there is no family at bedside to corroborate history, but they are apparently on the way. On exam, patient has normal vital signs, she is comfortable appearing, but is confused appearing and oriented 1 only. Remainder of neuro exam is nonfocal. Patient had similar presentation 2 months ago with altered mental status, found to be hyperglycemic and thought to be in DKA. Per discharge summary from that admission patient is normally a and O 3. Differential includes DKA, HHS, dehydration or other electrolyte abnormality, UTI or other infection, less likely cardiac. Low suspicion for primary CNS cause given nonfocal neuro exam. Plan for labs to rule out DKA, fluids (insulin already given by EMS), infection workup, and reassess.    ----------------------------------------- 1:37 PM on 04/05/2017 -----------------------------------------  Patient feeling slightly better. Workup reveals elevated glucose, and slight hypokalemia.  Infectious workup negative.  Will replete K, continue fluids, and admit.  Signed out to Dr. Tressia Miners.   ____________________________________________   FINAL CLINICAL IMPRESSION(S) / ED DIAGNOSES  Final diagnoses:  Altered mental status, unspecified altered mental status type  Hyperglycemia      NEW MEDICATIONS STARTED DURING THIS VISIT:  New Prescriptions   No medications on file     Note:  This document was prepared using Dragon voice recognition software and may include unintentional dictation errors.    Arta Silence, MD 04/05/17 [phone]

## 2017-04-05 NOTE — ED Notes (Signed)
Report to [employer]

## 2017-04-05 NOTE — ED Triage Notes (Signed)
Pt arrived via EMS from nearby hotel where she was staying with family enroute to [place]. Pt is a diabetic and sugar checked this morning was 501.  Pt was given a dose of insulin and sugar was brought down to 467. EMS reports pt was alert to self only.  Pt is alert on arrival and able to speak some about traveling to [place] to see her son.  No family present at this time.

## 2017-04-05 NOTE — ED Notes (Signed)
Attempted IV access x2 unsuccessful 

## 2017-04-05 NOTE — H&P (Signed)
[name] [name] at [place] NAME: [name] [name]    MR#:  [id number]  DATE OF BIRTH:  [birth year]  DATE OF ADMISSION:  04/05/2017  PRIMARY CARE PHYSICIAN: [name], DO   REQUESTING/REFERRING PHYSICIAN: Dr. Arta Silence  CHIEF COMPLAINT:   Chief Complaint  Patient presents with  . Hyperglycemia    HISTORY OF PRESENT ILLNESS:  [name] [name]  is a 84 y.o. female with a known history of CAD, COPD, diabetes on insulin, hypertension, hypothyroidism,early dementia and tremors presents from home due to altered mental status, nausea and vomiting. Patient had a similar presentation about a 1 month ago with altered mental status and elevated sugars. Once her sugars were corrected she was sent to rehabilitation. She had a fall at home a few weeks ago and things have been declining gradually. Her family lives in [place], after discharge from rehabilitation they had a caregiver watching her during the weekdays for a few hours. Her most recent physician visit indicates that her eating has been erratic and so her sugars have been significantly low and so her Lantus was decreased to 9 units.course on was here to take her and they were staying in a nearby hotel, they said they will be hard to manage her in [place] as she has become significantly weak and unable to even take a few steps by herself. She had significant nausea and several episodes of vomiting last night and this morning. Her fingerstick was noted to be at 500 and she was noted to be significantly confused and so brought to the emergency room. Serum blood glucose was in the 300s.she is also dehydrated and so being admitted. No infection noted in the urine.  PAST MEDICAL HISTORY:   Past Medical History:  Diagnosis Date  . Anxiety state, unspecified   . CAD (coronary artery disease)   . Cancer [facility])    2004 Right breast, found on mammogram, radiation therapy, Dr. Bryson Ha, uterine cancer,   .  COPD (chronic obstructive pulmonary disease) ([place])   . Diabetes mellitus   . Esophageal reflux   . Heart burn   . Hyperlipidemia   . IBS (irritable bowel syndrome)   . Myocardial infarction [facility])    Cath negative except for 40% occlusion LAD.  Pt not candidate for betablocker or ACEI because of hypotension  . OSA (obstructive sleep apnea)   . Osteoporosis 02/21/09   DEXA scan showed osteoporosis with left femur T-score -2.8.  Marland Kitchen Presbyacusis   . Stroke ([place])   . Thyroid disease    Hypothyroid  . Vitamin D deficiency     PAST SURGICAL HISTORY:   Past Surgical History:  Procedure Laterality Date  . ABDOMINAL HYSTERECTOMY     uterine cancer  . BREAST SURGERY    . COLON SURGERY  2013   done at [place]:   Social History  Substance Use Topics  . Smoking status: Never Smoker  . Smokeless tobacco: Never Used  . Alcohol use No    FAMILY HISTORY:   Family History  Problem Relation Age of Onset  . Diabetes Mother   . Heart attack Father     DRUG ALLERGIES:   Allergies  Allergen Reactions  . Penicillins Rash    Has patient had a PCN reaction causing immediate rash, facial/tongue/throat swelling, SOB or lightheadedness with hypotension: No Has patient had a PCN reaction causing severe rash involving mucus membranes or skin necrosis: No Has patient had  a PCN reaction that required hospitalization No Has patient had a PCN reaction occurring within the last 10 years: No If all of the above answers are "NO", then may proceed with Cephalosporin use.     REVIEW OF SYSTEMS:   Review of Systems  Constitutional: Positive for malaise/fatigue. Negative for chills, fever and weight loss.  HENT: Positive for hearing loss. Negative for ear discharge, ear pain and nosebleeds.   Eyes: Negative for blurred vision, double vision and photophobia.  Respiratory: Negative for cough, hemoptysis, shortness of breath and wheezing.   Cardiovascular: Negative for chest pain,  palpitations, orthopnea and leg swelling.  Gastrointestinal: Positive for nausea and vomiting. Negative for abdominal pain, constipation, diarrhea, heartburn and melena.  Genitourinary: Negative for dysuria, frequency and urgency.  Musculoskeletal: Positive for falls and myalgias. Negative for back pain and neck pain.  Skin: Negative for rash.  Neurological: Positive for tremors. Negative for dizziness, tingling, sensory change, speech change, focal weakness and headaches.  Endo/Heme/Allergies: Does not bruise/bleed easily.  Psychiatric/Behavioral: Negative for depression.    MEDICATIONS AT HOME:   Prior to Admission medications   Medication Sig Start Date End Date Taking? Authorizing Provider  aspirin EC 81 MG tablet Take 1 tablet (81 mg total) by mouth daily. 04/09/16  Yes Cook, Jayce G, DO  atorvastatin (LIPITOR) 80 MG tablet Take 1 tablet (80 mg total) by mouth daily. 08/21/16  Yes Cook, Jayce G, DO  clopidogrel (PLAVIX) 75 MG tablet TAKE 1 TABLET(75 MG) BY MOUTH DAILY 01/01/17  Yes Cook, Jayce G, DO  glucagon 1 MG injection Inject 1 mg into the vein once as needed.   Yes [provider]  insulin glargine (LANTUS) 100 UNIT/ML injection Inject 0.22 mLs (22 Units total) into the skin daily. Patient taking differently: Inject 11 Units into the skin daily.  02/16/17  Yes Fritzi Mandes, MD  insulin lispro (HUMALOG) 100 UNIT/ML injection Inject 10 Units into the skin 2 (two) times daily. take 10 units at breakfast and 10 units at supper. Never give if sugar is 80 or less.   Yes [provider]  levothyroxine (SYNTHROID, LEVOTHROID) 100 MCG tablet TAKE 1 TABLET(100 MCG) BY MOUTH DAILY 01/01/17  Yes Cook, Jayce G, DO  loperamide (IMODIUM) 2 MG capsule Take 4 mg by mouth as needed for diarrhea or loose stools.   Yes [provider]  metFORMIN (GLUCOPHAGE) 500 MG tablet TAKE 1 TABLET(500 MG) BY MOUTH TWICE DAILY WITH A MEAL 10/20/16  Yes Cook, Jayce G, DO  midodrine (PROAMATINE) 10  MG tablet Take 1 tablet (10 mg total) by mouth 3 (three) times daily with meals. 01/30/17  Yes Cook, Jayce G, DO  pantoprazole (PROTONIX) 40 MG tablet Take 1 tablet (40 mg total) by mouth 2 (two) times daily. 01/04/16  Yes Jackolyn Confer, MD  sertraline (ZOLOFT) 25 MG tablet Take 1 tablet (25 mg total) by mouth daily after breakfast. 02/17/17  Yes Fritzi Mandes, MD  fluticasone (FLONASE) 50 MCG/ACT nasal spray Place 2 sprays into both nostrils daily. Patient taking differently: Place 2 sprays into both nostrils daily as needed.  01/01/17   [name], DO      VITAL SIGNS:  Blood pressure 108/61, pulse 66, temperature 97.6 F (36.4 C), temperature source Oral, resp. rate 18, height 5\' 6"  (1.676 m), weight 62.6 kg (138 lb), SpO2 95 %.  PHYSICAL EXAMINATION:   Physical Exam  GENERAL:  83 y.o.-year-old elderly patient lying in the bed with no acute distress.  EYES:  Pupils equal, round, reactive to light and accommodation. No scleral icterus. Extraocular muscles intact.  HEENT: Head atraumatic, normocephalic. Oropharynx and nasopharynx clear.  NECK:  Supple, no jugular venous distention. No thyroid enlargement, no tenderness.  LUNGS: Normal breath sounds bilaterally, no wheezing, rales,rhonchi or crepitation. No use of accessory muscles of respiration. Decreased bibasilar breath sounds CARDIOVASCULAR: S1, S2 normal. No rubs, or gallops. 3/6 systolic murmur present ABDOMEN: Soft, nontender, nondistended. Bowel sounds present. No organomegaly or mass.  EXTREMITIES: No pedal edema, cyanosis, or clubbing.  NEUROLOGIC: Cranial nerves II through XII are intact. Muscle strength 4/5 in both upper extremities and 3/5 in left lower extremity and 2/5 in LLE due to knee pain.. Sensation intact. Gait not checked. Global weakness noted. PSYCHIATRIC: The patient is alert and oriented to self. Very slow to respond. SKIN: No obvious rash, lesion, or ulcer.   LABORATORY PANEL:   CBC  Recent Labs Lab  04/05/17 0823  WBC 6.0  HGB 10.8*  HCT 31.6*  PLT 310   ------------------------------------------------------------------------------------------------------------------  Chemistries   Recent Labs Lab 04/05/17 0823  NA 135  K 3.2*  CL 98*  CO2 20*  GLUCOSE 340*  BUN 23*  CREATININE 1.49*  CALCIUM 9.8   ------------------------------------------------------------------------------------------------------------------  Cardiac Enzymes No results for input(s): TROPONINI in the last 168 hours. ------------------------------------------------------------------------------------------------------------------  RADIOLOGY:  Dg Chest Portable 1 View  Result Date: 04/05/2017 CLINICAL DATA:  Altered mental status. EXAM: PORTABLE CHEST 1 VIEW COMPARISON:  Radiograph February 12, 2017. FINDINGS: The heart size and mediastinal contours are within normal limits. Atherosclerosis of thoracic aorta is noted. No pneumothorax or pleural effusion is noted. Both lungs are clear. The visualized skeletal structures are unremarkable. IMPRESSION: Aortic atherosclerosis.  No acute cardiopulmonary abnormality seen. Electronically Signed   By: Marijo Conception, M.D.   On: 04/05/2017 09:07    EKG:   Orders placed or performed during the hospital encounter of 04/05/17  . ED EKG  . ED EKG  . EKG 12-Lead  . EKG 12-Lead    IMPRESSION AND PLAN:   [name] [name]  is a 83 y.o. female with a known history of CAD, COPD, diabetes on insulin, hypertension, hypothyroidism,early dementia and tremors presents from home due to altered mental status, nausea and vomiting.  #1 altered mental status-secondary to metabolic encephalopathy, elevated sugars. -We'll get a CT head though less likely to be neurological. -PT consult.  #2 uncontrolled diabetes mellitus with hyperglycemia-Lantus was at 24 units up until recently, decreased to 9 units due to episodes of hypoglycemia from changes in eating habits. -Last A1c was  still at 8.8. Recheck A1c, Lantus started at 15 units -diabetes coordinator consult. Continue metformin. Anion gap very slightly elevated so followed to see if she needs to be on an insulin drip. -Gentle hydration and monitor. -We will likely need long-term placement.  #3 hypotension-chronic, on midodrine. We'll continue  #4 hypothyroidism-on Synthroid  #5 DVT prophylaxis-on Lovenox  #6 mild renal insufficiency which is acute-gentle hydration and monitor. Baseline creatinine seems to be around 1.1  Physical therapy and social worker consult    All the records are reviewed and case discussed with ED provider. Management plans discussed with the patient, family and they are in agreement.  CODE STATUS: Full Code  TOTAL TIME TAKING CARE OF THIS PATIENT: 50 minutes.    [name] M.D on 04/05/2017 at 2:54 PM  Between 7am to 6pm - Pager - [phone]  After 6pm go to [email or web address] - password [email or web address]  [employer]  Office  [phone]  CC: Primary care physician; [name], DO

## 2017-04-05 NOTE — ED Notes (Signed)
Patient denies pain and is resting comfortably.  

## 2017-04-06 LAB — GLUCOSE, CAPILLARY
GLUCOSE-CAPILLARY: 258 mg/dL — AB (ref 65–99)
GLUCOSE-CAPILLARY: 336 mg/dL — AB (ref 65–99)
Glucose-Capillary: 280 mg/dL — ABNORMAL HIGH (ref 65–99)
Glucose-Capillary: 143 mg/dL — ABNORMAL HIGH (ref 65–99)

## 2017-04-06 LAB — BASIC METABOLIC PANEL
Anion gap: 6 (ref 5–15)
BUN: 13 mg/dL (ref 6–20)
CO2: 23 mmol/L (ref 22–32)
CREATININE: 0.96 mg/dL (ref 0.44–1.00)
Calcium: 8.6 mg/dL — ABNORMAL LOW (ref 8.9–10.3)
Chloride: 109 mmol/L (ref 101–111)
GFR calc Af Amer: >60 mL/min (ref >60)
GFR calc non Af Amer: 53 mL/min — ABNORMAL LOW (ref >60)
GLUCOSE: 208 mg/dL — AB (ref 65–99)
POTASSIUM: 4.7 mmol/L (ref 3.5–5.1)
SODIUM: 138 mmol/L (ref 135–145)

## 2017-04-06 LAB — CBC
HCT: 26.1 % — ABNORMAL LOW (ref 35.0–47.0)
Hemoglobin: 9.1 g/dL — ABNORMAL LOW (ref 12.0–16.0)
MCH: 32.5 pg (ref 26.0–34.0)
MCHC: 34.8 g/dL (ref 32.0–36.0)
MCV: 93.5 fL (ref 80.0–100.0)
PLATELETS: 232 10*3/uL (ref 150–440)
RBC: 2.79 MIL/uL — ABNORMAL LOW (ref 3.80–5.20)
RDW: 15.6 % — AB (ref 11.5–14.5)
WBC: 4.4 10*3/uL (ref 3.6–11.0)

## 2017-04-06 LAB — HEMOGLOBIN A1C
Hgb A1c MFr Bld: 7.9 % — ABNORMAL HIGH (ref 4.8–5.6)
MEAN PLASMA GLUCOSE: 180.03 mg/dL

## 2017-04-06 MED ORDER — INSULIN ASPART 100 UNIT/ML ~~LOC~~ SOLN
3.0000 [IU] | Freq: Three times a day (TID) | SUBCUTANEOUS | Status: DC
  Administered 2017-04-06 – 2017-04-07 (×3): 3 [IU] via SUBCUTANEOUS
  Filled 2017-04-06 (×3): qty 1

## 2017-04-06 MED ORDER — ENOXAPARIN SODIUM 40 MG/0.4ML ~~LOC~~ SOLN
40.0000 mg | SUBCUTANEOUS | Status: DC
  Administered 2017-04-06 – 2017-04-07 (×2): 40 mg via SUBCUTANEOUS
  Filled 2017-04-06 (×2): qty 0.4

## 2017-04-06 NOTE — Clinical Social Work Note (Signed)
Clinical Social Work Assessment  Patient Details  Name: [name] [name] MRN: [id number] Date of Birth: Nov 07, 1931  Date of referral:  04/06/17               Reason for consult:  Facility Placement                Permission sought to share information with:  Facility Sport and exercise psychologist, Family Supports Permission granted to share information::  Yes, Verbal Permission Granted  Name::        Agency::     Relationship::     Contact Information:     Housing/Transportation Living arrangements for the past 2 months:  [place], [place] of Information:  Patient Patient Interpreter Needed:  None Criminal Activity/Legal Involvement Pertinent to Current Situation/Hospitalization:  No - Comment as needed Significant Relationships:  Adult Children Lives with:  Other (Comment) (grand daughter) Do you feel safe going back to the place where you live?  Yes Need for family participation in patient care:  Yes (Comment)  Care giving concerns:  Patient was recently at [facility] for [place] and has since been home for about 2 weeks.    Social Worker assessment / plan:  CSW met with patient's granddaughter, [name] [name], [phone], and she stated that [facility] did not keep patient beyond 20 days and that she did not feel she was ready to discharge. [name] [name] stated that patient's son, [name] [name], had come to take patient to [place] to live with him but she became sick again and he had to leave to return to work. Currently awaiting PT consult for recommendations. If recommendation is STR, then will extend bed offers as bed search has been initiated.   Employment status:  Retired Nurse, adult PT Recommendations:  Not assessed at this time Information / Referral to community resources:     Patient/Family's Response to care:  Patient's granddaughter has expressed appreciation for CSW assistance.  Patient/Family's Understanding of and Emotional  Response to Diagnosis, Current Treatment, and Prognosis:  Patients granddaughter has requested that GI be consulted as she has mentioned that patient has been throwing up at home and she has not yet been seen by GI. CSW has relayed this to patient's nurse to have MD paged to inform.  Emotional Assessment Appearance:    Attitude/Demeanor/Rapport:   (pleasant and cooperative) Affect (typically observed):  Accepting, Adaptable Orientation:  Oriented to Self Alcohol / Substance use:  Not Applicable Psych involvement (Current and /or in the community):  No (Comment)  Discharge Needs  Concerns to be addressed:  Care Coordination Readmission within the last 30 days:  No Current discharge risk:  None Barriers to Discharge:  No Barriers Identified   Shela Leff, LCSW 04/06/2017, 12:48 PM

## 2017-04-06 NOTE — NC FL2 (Signed)
[place] LEVEL OF CARE SCREENING TOOL     IDENTIFICATION  Patient Name: [name] [name] Birthdate: Aug 23, 1931 Sex: female Admission Date (Current Location): 04/05/2017  [place] and [place] Number:  Engineering geologist and Address:  [facility], [address], [place], [place] [address]      Provider Number: [id number]  Attending Physician Name and Address:  Demetrios Loll, MD  Relative Name and Phone Number:       Current Level of Care: Hospital Recommended Level of Care: [place] Prior Approval Number:    Date Approved/Denied:   PASRR Number: [id number]  Discharge Plan: SNF    Current Diagnoses: Patient Active Problem List   Diagnosis Date Noted  . Metabolic encephalopathy 67/06/4579  . Adjustment disorder with mixed anxiety and depressed mood   . Coffee ground emesis 02/12/2017  . Acute on chronic renal failure ([place]) 01/13/2017  . Dementia, vascular 12/03/2016  . Stroke ([place]) 08/20/2016  . Peripheral vascular disease ([place]) 04/09/2016  . DKA (diabetic ketoacidoses) ([place]) 06/08/2015  . CKD stage 3 due to type 2 diabetes mellitus ([place]) 02/22/2014  . Hypothyroidism 09/23/2012  . GERD (gastroesophageal reflux disease) 09/23/2012  . Anxiety 07/26/2012  . Hyperlipidemia with target LDL less than 70 12/03/2011  . DM (diabetes mellitus) type II uncontrolled, periph vascular disorder ([place]) 04/03/2011  . CAD (coronary artery disease) 12/13/2009    Orientation RESPIRATION BLADDER Height & Weight     Self  Normal Incontinent Weight: 139 lb 11.2 oz (63.4 kg) Height:  5\' 6"  (167.6 cm)  BEHAVIORAL SYMPTOMS/MOOD NEUROLOGICAL BOWEL NUTRITION STATUS   (NONE)  (NONE) Continent Diet  AMBULATORY STATUS COMMUNICATION OF NEEDS Skin   Limited Assist Verbally Normal                       Personal Care Assistance Level of Assistance  Bathing, Dressing Bathing Assistance: Limited assistance   Dressing Assistance:  Limited assistance     Functional Limitations Info  Hearing   Hearing Info: Impaired      SPECIAL CARE FACTORS FREQUENCY  PT (By licensed PT)                    Contractures Contractures Info: Not present    Additional Factors Info  Code Status Code Status Info: FULL             Current Medications (04/06/2017):  This is the current hospital active medication list Current Facility-Administered Medications  Medication Dose Route Frequency Provider Last Rate Last Dose  . 0.9 %  sodium chloride infusion   Intravenous Continuous [name], MD 75 mL/hr at 04/06/17 0531    . acetaminophen (TYLENOL) tablet 650 mg  650 mg Oral Q6H PRN [name], MD       Or  . acetaminophen (TYLENOL) suppository 650 mg  650 mg Rectal Q6H PRN [name], MD      . aspirin EC tablet 81 mg  81 mg Oral Daily [name], MD   81 mg at 04/06/17 1100  . atorvastatin (LIPITOR) tablet 80 mg  80 mg Oral Daily [name], MD   80 mg at 04/06/17 1100  . clopidogrel (PLAVIX) tablet 75 mg  75 mg Oral Daily [name], MD   75 mg at 04/06/17 1100  . enoxaparin (LOVENOX) injection 40 mg  40 mg Subcutaneous Q24H Demetrios Loll, MD      . insulin aspart (novoLOG) injection 0-5 Units  0-5  Units Subcutaneous QHS [name], MD   3 Units at 04/05/17 2217  . insulin aspart (novoLOG) injection 0-9 Units  0-9 Units Subcutaneous TID WC [name], MD   7 Units at 04/06/17 1218  . insulin glargine (LANTUS) injection 15 Units  15 Units Subcutaneous Daily [name], MD   15 Units at 04/06/17 1059  . insulin regular (NOVOLIN R,HUMULIN R) 100 units/mL injection 10 Units  10 Units Intravenous Once [name], MD      . levothyroxine (SYNTHROID, LEVOTHROID) tablet 100 mcg  100 mcg Oral QAC breakfast [name], MD   100 mcg at 04/06/17 0817  . loperamide (IMODIUM) capsule 4 mg  4 mg Oral PRN [name], MD      . midodrine  (PROAMATINE) tablet 10 mg  10 mg Oral TID WC [name], MD   10 mg at 04/06/17 1219  . ondansetron (ZOFRAN) tablet 4 mg  4 mg Oral Q6H PRN [name], MD       Or  . ondansetron (ZOFRAN) injection 4 mg  4 mg Intravenous Q6H PRN [name], MD      . pantoprazole (PROTONIX) EC tablet 40 mg  40 mg Oral BID [name], MD   40 mg at 04/06/17 1100  . senna-docusate (Senokot-S) tablet 1 tablet  1 tablet Oral QHS PRN [name], MD      . sertraline (ZOLOFT) tablet 25 mg  25 mg Oral QPC breakfast [name], MD   25 mg at 04/06/17 8756     Discharge Medications: Please see discharge summary for a list of discharge medications.  Relevant Imaging Results:  Relevant Lab Results:   Additional Information ss: [id number]  Shela Leff, LCSW

## 2017-04-06 NOTE — Progress Notes (Signed)
Pt noted that she has only one hearing aid. Hearing aid placed in pink container and on table at bedside.

## 2017-04-06 NOTE — Progress Notes (Signed)
[facility] [place] NAME: [name] [name]    MR#:  [id number]  DATE OF BIRTH:  1932/06/24  SUBJECTIVE:  CHIEF COMPLAINT:   Chief Complaint  Patient presents with  . Hyperglycemia   Generalized weakness. REVIEW OF SYSTEMS:  Review of Systems  Constitutional: Positive for malaise/fatigue. Negative for chills and fever.  HENT: Negative for sore throat.   Eyes: Negative for blurred vision and double vision.  Respiratory: Negative for cough, hemoptysis, shortness of breath, wheezing and stridor.   Cardiovascular: Negative for chest pain, palpitations, orthopnea and leg swelling.  Gastrointestinal: Negative for abdominal pain, blood in stool, diarrhea, melena, nausea and vomiting.  Genitourinary: Negative for dysuria, flank pain and hematuria.  Musculoskeletal: Negative for back pain and joint pain.  Skin: Negative for rash.  Neurological: Positive for weakness. Negative for dizziness, sensory change, focal weakness, seizures, loss of consciousness and headaches.  Endo/Heme/Allergies: Negative for polydipsia.  Psychiatric/Behavioral: Negative for depression. The patient is not nervous/anxious.     DRUG ALLERGIES:   Allergies  Allergen Reactions  . Penicillins Rash    Has patient had a PCN reaction causing immediate rash, facial/tongue/throat swelling, SOB or lightheadedness with hypotension: No Has patient had a PCN reaction causing severe rash involving mucus membranes or skin necrosis: No Has patient had a PCN reaction that required hospitalization No Has patient had a PCN reaction occurring within the last 10 years: No If all of the above answers are "NO", then may proceed with Cephalosporin use.    VITALS:  Blood pressure (!) 114/56, pulse 73, temperature 98.2 F (36.8 C), temperature source Oral, resp. rate 16, height 5\' 6"  (1.676 m), weight 139 lb 11.2 oz (63.4 kg), SpO2 95 %. PHYSICAL EXAMINATION:  Physical Exam    Constitutional:  Look fragile  HENT:  Head: Normocephalic.  Mouth/Throat: Oropharynx is clear and moist.  Eyes: Conjunctivae and EOM are normal. No scleral icterus.  Neck: Normal range of motion. Neck supple. No JVD present. No tracheal deviation present.  Cardiovascular: Normal rate, regular rhythm and normal heart sounds.  Exam reveals no gallop.   No murmur heard. Pulmonary/Chest: Effort normal and breath sounds normal. No respiratory distress. She has no wheezes. She has no rales.  Abdominal: Soft. Bowel sounds are normal. She exhibits no distension. There is no tenderness. There is no rebound.  Musculoskeletal: Normal range of motion. She exhibits no edema or tenderness.  Neurological: She is alert. No cranial nerve deficit.  AAOx2  Skin: No rash noted. No erythema.  Psychiatric:  Demented.   LABORATORY PANEL:  Female CBC  Recent Labs Lab 04/06/17 0331  WBC 4.4  HGB 9.1*  HCT 26.1*  PLT 232   ------------------------------------------------------------------------------------------------------------------ Chemistries   Recent Labs Lab 04/06/17 0331  NA 138  K 4.7  CL 109  CO2 23  GLUCOSE 208*  BUN 13  CREATININE 0.96  CALCIUM 8.6*   RADIOLOGY:  Ct Head Wo Contrast  Result Date: 04/05/2017 CLINICAL DATA:  Altered mental status, nausea, and vomiting. EXAM: CT HEAD WITHOUT CONTRAST TECHNIQUE: Contiguous axial images were obtained from the base of the skull through the vertex without intravenous contrast. COMPARISON:  02/12/2017 FINDINGS: Brain: Small chronic infarcts are again seen in the cerebellum and left basal ganglia. Extensive confluent hypoattenuation throughout the cerebral white matter bilaterally is unchanged. Ventricular enlargement is unchanged and may reflect advanced central predominant cerebral atrophy although normal pressure hydrocephalus is not excluded in the appropriate clinical setting. No acute large  territory infarct, intracranial hemorrhage,  mass, midline shift, or extra-axial fluid collection is identified. Vascular: No hyperdense vessel. Skull: No fracture focal osseous lesion. Sinuses/Orbits: Visualized paranasal sinuses and mastoid air cells are clear. Bilateral cataract extraction. Other: None. IMPRESSION: 1. No evidence of acute intracranial abnormality. 2. Extensive chronic small vessel ischemic disease. 3. Unchanged ventricular enlargement which may reflect cerebral atrophy versus normal pressure hydrocephalus. Electronically Signed   By: [name] [name] M.D.   On: 04/05/2017 17:00   ASSESSMENT AND PLAN:   [name] [name]  is a 83 y.o. female with a known history of CAD, COPD, diabetes on insulin, hypertension, hypothyroidism,early dementia and tremors presents from home due to altered mental status, nausea and vomiting.  #1 altered mental status-secondary to metabolic encephalopathy, elevated sugars.  CT head is unremarkable.  #2 uncontrolled diabetes mellitus with hyperglycemia Lantus was at 24 units up until recently, decreased to 9 units due to episodes of hypoglycemia from changes in eating habits. Continue lantus 15 units HS, add NovoLog 3 units before meals meals. Appreciate diabetes coordinator consult.  Hold metformin.  #3 hypotension-chronic, on midodrine.  #4 hypothyroidism-on Synthroid  #5 DVT prophylaxis-on Lovenox  #6 ARF due to dehydration. Improved with IV fluid support.  Hypokalemia. Improved with potassium supplement.  Anemia of chronic disease. Stable.  Generalized weakness.  Physical therapy is pending.  All the records are reviewed and case discussed with Care Management/Social Worker. Management plans discussed with the patient, her son and they are in agreement.  CODE STATUS: Full Code  TOTAL TIME TAKING CARE OF THIS PATIENT: 36 minutes.   More than 50% of the time was spent in counseling/coordination of care: YES  POSSIBLE D/C IN 1-2 DAYS, DEPENDING ON CLINICAL  CONDITION.   [name] [name] M.D on 04/06/2017 at 3:34 PM  Between 7am to 6pm - Pager - [phone]  After 6pm go to [email or web address] - [facility]

## 2017-04-06 NOTE — Progress Notes (Signed)
Inpatient Diabetes Program Recommendations  AACE/ADA: New Consensus Statement on Inpatient Glycemic Control (2015)  Target Ranges:  Prepandial:   less than 140 mg/dL      Peak postprandial:   less than 180 mg/dL (1-2 hours)      Critically ill patients:  140 - 180 mg/dL   Lab Results  Component Value Date   GLUCAP 336 (H) 04/06/2017   HGBA1C 7.9 (H) 04/05/2017    Review of Glycemic ControlResults for NEVE, BRANSCOMB (MRN [id number]) as of 04/06/2017 12:56  Ref. Range 04/05/2017 14:57 04/05/2017 16:58 04/05/2017 21:50 04/06/2017 07:56 04/06/2017 11:38  Glucose-Capillary Latest Ref Range: 65 - 99 mg/dL 156 (H) 166 (H) 253 (H) 258 (H) 336 (H)   Diabetes history: Type 2 diabetes Outpatient Diabetes medications: Lantus 22 units daily, Humalog 10 units bid Current orders for Inpatient glycemic control:  Novolog sensitive tid with meals and HS, Lantus 15 units daily  Inpatient Diabetes Program Recommendations:   Please consider adding Novolog meal coverage 3 units tid with meals-Hold if patient eats less than 50%.  Note that A1C is improved from July, 2018.   Thanks, Adah Perl, RN, BC-ADM Inpatient Diabetes Coordinator Pager [phone] (8a-5p)

## 2017-04-06 NOTE — Plan of Care (Signed)
Problem: Activity: Goal: Risk for activity intolerance will decrease Outcome: Progressing Pt worked with pt today and was able to get stand up with walker and get into chair

## 2017-04-06 NOTE — Clinical Social Work Note (Signed)
CSW noted PT has recommended STR. CSW has spoken to patient's granddaughter, [name], and extended the one bed offer. She is wanting to see if there will be other offers before accepting the [employer] offer. Shela Leff MSW,LCSW [phone]

## 2017-04-06 NOTE — Progress Notes (Signed)
Pharmacist - Prescriber Communication  Enoxaparin dose modified to 40 units subcutaneously once daily due to creatinine clearance now greater than 30 mL/min.   Muntaha Vermette A. Festus, [place].D., BCPS Clinical Pharmacist 04/06/2017 08:50

## 2017-04-06 NOTE — Evaluation (Signed)
Physical Therapy Evaluation Patient Details Name: [name] [name] MRN: [id number] DOB: 25-Jun-1932 Today's Date: 04/06/2017   History of Present Illness  Pt is a 83 y/o F who presented with AMS, nausea, and vomiting.  Her fingerstick was noted to be at 500.  Head CT showed no evidence of acute intracranial abnormality.  Patient had a similar presentation about a 1 month ago with altered mental status and elevated sugars. She had a fall at home a few weeks ago and things have been declining gradually. Pt's PMH includes dementia, breast cancer, COPD, MI, osteoporosis, stroke.    Clinical Impression  Pt admitted with above diagnosis. Pt currently with functional limitations due to the deficits listed below (see PT Problem List). [name] [name] was minimally verbal during session but agreeable to work with therapy.  Unsure of PLOF or home layout as no family present.  She currently requires mod assist for bed mobility and min assist with max increased time and cues for transfers.  Given pt's current mobility status, recommending SNF at d/c.  Pt will benefit from skilled PT to increase their independence and safety with mobility to allow discharge to the venue listed below.     Follow Up Recommendations SNF;Supervision/Assistance - 24 hour    Equipment Recommendations  Other (comment) (TBD at next venue of care)    Recommendations for Other Services       Precautions / Restrictions Precautions Precautions: Fall Restrictions Weight Bearing Restrictions: No      Mobility  Bed Mobility Overal bed mobility: Needs Assistance Bed Mobility: Supine to Sit     Supine to sit: Mod assist;HOB elevated     General bed mobility comments: Pt uses bed rail and requires assist to elevate trunk and assist using bed pad for positioning and to scoot to EOB.   Transfers Overall transfer level: Needs assistance Equipment used: Rolling walker (2 wheeled) Transfers: Sit to/from [employer] Sit to Stand: Min assist Stand pivot transfers: Min assist       General transfer comment: Assist to boost to standing.  Pt performs incorrectly with improper hand placement for sit<>stand despite verbal and tactile cues.  Pt requires assist managing RW when pivoting to chair and requires constant directional cues due to poor motor planning.  Ambulation/Gait Ambulation/Gait assistance: Min assist Ambulation Distance (Feet): 3 Feet Assistive device: Rolling walker (2 wheeled) Gait Pattern/deviations: Shuffle;Antalgic;Trunk flexed Gait velocity: decreased Gait velocity interpretation: <1.8 ft/sec, indicative of risk for recurrent falls General Gait Details: Pt shuffles her feet and requires constant directional cues as well as assist managing RW.    Stairs            Wheelchair Mobility    Modified Rankin (Stroke Patients Only)       Balance Overall balance assessment: Needs assistance Sitting-balance support: No upper extremity supported;Feet supported Sitting balance-Leahy Scale: Fair Sitting balance - Comments: Pt able to sit statically EOB without UE support but would likely lose her balance with perturbation   Standing balance support: Bilateral upper extremity supported;During functional activity Standing balance-Leahy Scale: Poor Standing balance comment: Pt relies on UE support for static and dynamic activities                             Pertinent Vitals/Pain Pain Assessment: No/denies pain (no signs of discomfort or pain)    Home Living Family/patient expects to be discharged to:: Skilled nursing facility  Additional Comments: Unsure of pt's home living situation PTA as family not present     Prior Function           Comments: No family present with provide information.  Pt nods her head when asked if she uses a walker and says, "not too far" when asked how far she can walk.  Pt was likely requiring assist  with ADLs and at least supervision with mobility.     Hand Dominance        Extremity/Trunk Assessment   Upper Extremity Assessment Upper Extremity Assessment:  (BUE strength grossly 3/5)    Lower Extremity Assessment Lower Extremity Assessment:  (BLE strength grossly 3+/5)    Cervical / Trunk Assessment Cervical / Trunk Assessment: Kyphotic  Communication   Communication: HOH  Cognition Arousal/Alertness: Awake/alert Behavior During Therapy: Flat affect Overall Cognitive Status: History of cognitive impairments - at baseline                                 General Comments: Pt minimally verbal and requires a question asked several times which pt inconsistently answers.        General Comments      Exercises     Assessment/Plan    PT Assessment Patient needs continued PT services  PT Problem List Decreased strength;Decreased range of motion;Decreased activity tolerance;Decreased balance;Decreased mobility;Decreased cognition;Decreased knowledge of use of DME;Decreased safety awareness       PT Treatment Interventions DME instruction;Gait training;Stair training;Functional mobility training;Therapeutic activities;Therapeutic exercise;Balance training;Neuromuscular re-education;Cognitive remediation;Patient/family education;Wheelchair mobility training    PT Goals (Current goals can be found in the Care Plan section)  Acute Rehab PT Goals Patient Stated Goal: none stated PT Goal Formulation: Patient unable to participate in goal setting Time For Goal Achievement: 04/20/17 Potential to Achieve Goals: Good    Frequency Min 2X/week   Barriers to discharge Other (comment) Unsure of the amount of assist available    Co-evaluation               AM-PAC PT "6 Clicks" Daily Activity  Outcome Measure Difficulty turning over in bed (including adjusting bedclothes, sheets and blankets)?: Unable Difficulty moving from lying on back to sitting on the  side of the bed? : Unable Difficulty sitting down on and standing up from a chair with arms (e.g., wheelchair, bedside commode, etc,.)?: Unable Help needed moving to and from a bed to chair (including a wheelchair)?: Total Help needed walking in hospital room?: Total Help needed climbing 3-5 steps with a railing? : Total 6 Click Score: 6    End of Session Equipment Utilized During Treatment: Gait belt Activity Tolerance: Patient tolerated treatment well Patient left: in chair;with call bell/phone within reach;with chair alarm set Nurse Communication: Mobility status;Other (comment) (RN present during transfer) PT Visit Diagnosis: Unsteadiness on feet (R26.81);Muscle weakness (generalized) (M62.81);Difficulty in walking, not elsewhere classified (R26.2);Other abnormalities of gait and mobility (R26.89)    Time: 4481-8563 PT Time Calculation (min) (ACUTE ONLY): 25 min   Charges:   PT Evaluation $PT Eval Low Complexity: 1 Low PT Treatments $Therapeutic Activity: 8-22 mins   PT G Codes:   PT G-Codes **NOT FOR INPATIENT CLASS** Functional Assessment Tool Used: AM-PAC 6 Clicks Basic Mobility;Clinical judgement Functional Limitation: Mobility: Walking and moving around Mobility: Walking and Moving Around Current Status (J4970): 100 percent impaired, limited or restricted Mobility: Walking and Moving Around Goal Status (Y6378): At least 60 percent but less  than 80 percent impaired, limited or restricted    Collie Siad PT, DPT 04/06/2017, 3:18 PM

## 2017-04-07 ENCOUNTER — Other Ambulatory Visit: Payer: Self-pay | Admitting: *Deleted

## 2017-04-07 DIAGNOSIS — M6281 Muscle weakness (generalized): Secondary | ICD-10-CM | POA: Diagnosis not present

## 2017-04-07 DIAGNOSIS — Z90710 Acquired absence of both cervix and uterus: Secondary | ICD-10-CM | POA: Diagnosis not present

## 2017-04-07 DIAGNOSIS — Z7902 Long term (current) use of antithrombotics/antiplatelets: Secondary | ICD-10-CM | POA: Diagnosis not present

## 2017-04-07 DIAGNOSIS — G9382 Brain death: Secondary | ICD-10-CM | POA: Diagnosis not present

## 2017-04-07 DIAGNOSIS — E870 Hyperosmolality and hypernatremia: Secondary | ICD-10-CM | POA: Diagnosis not present

## 2017-04-07 DIAGNOSIS — E039 Hypothyroidism, unspecified: Secondary | ICD-10-CM | POA: Diagnosis not present

## 2017-04-07 DIAGNOSIS — R739 Hyperglycemia, unspecified: Secondary | ICD-10-CM | POA: Diagnosis not present

## 2017-04-07 DIAGNOSIS — J449 Chronic obstructive pulmonary disease, unspecified: Secondary | ICD-10-CM | POA: Diagnosis not present

## 2017-04-07 DIAGNOSIS — E1165 Type 2 diabetes mellitus with hyperglycemia: Secondary | ICD-10-CM

## 2017-04-07 DIAGNOSIS — S0990XA Unspecified injury of head, initial encounter: Secondary | ICD-10-CM | POA: Diagnosis not present

## 2017-04-07 DIAGNOSIS — N179 Acute kidney failure, unspecified: Secondary | ICD-10-CM | POA: Diagnosis not present

## 2017-04-07 DIAGNOSIS — G9341 Metabolic encephalopathy: Secondary | ICD-10-CM | POA: Diagnosis not present

## 2017-04-07 DIAGNOSIS — S299XXA Unspecified injury of thorax, initial encounter: Secondary | ICD-10-CM | POA: Diagnosis not present

## 2017-04-07 DIAGNOSIS — I639 Cerebral infarction, unspecified: Secondary | ICD-10-CM | POA: Diagnosis not present

## 2017-04-07 DIAGNOSIS — I252 Old myocardial infarction: Secondary | ICD-10-CM | POA: Diagnosis not present

## 2017-04-07 DIAGNOSIS — I10 Essential (primary) hypertension: Secondary | ICD-10-CM | POA: Diagnosis not present

## 2017-04-07 DIAGNOSIS — E1151 Type 2 diabetes mellitus with diabetic peripheral angiopathy without gangrene: Secondary | ICD-10-CM | POA: Diagnosis not present

## 2017-04-07 DIAGNOSIS — Z880 Allergy status to penicillin: Secondary | ICD-10-CM | POA: Diagnosis not present

## 2017-04-07 DIAGNOSIS — E119 Type 2 diabetes mellitus without complications: Secondary | ICD-10-CM | POA: Diagnosis not present

## 2017-04-07 DIAGNOSIS — Z794 Long term (current) use of insulin: Principal | ICD-10-CM

## 2017-04-07 DIAGNOSIS — Z8249 Family history of ischemic heart disease and other diseases of the circulatory system: Secondary | ICD-10-CM | POA: Diagnosis not present

## 2017-04-07 DIAGNOSIS — G4733 Obstructive sleep apnea (adult) (pediatric): Secondary | ICD-10-CM | POA: Diagnosis not present

## 2017-04-07 DIAGNOSIS — Z8673 Personal history of transient ischemic attack (TIA), and cerebral infarction without residual deficits: Secondary | ICD-10-CM | POA: Diagnosis not present

## 2017-04-07 DIAGNOSIS — F329 Major depressive disorder, single episode, unspecified: Secondary | ICD-10-CM | POA: Diagnosis present

## 2017-04-07 DIAGNOSIS — Z853 Personal history of malignant neoplasm of breast: Secondary | ICD-10-CM | POA: Diagnosis not present

## 2017-04-07 DIAGNOSIS — Y92129 Unspecified place in nursing home as the place of occurrence of the external cause: Secondary | ICD-10-CM | POA: Diagnosis not present

## 2017-04-07 DIAGNOSIS — R5381 Other malaise: Secondary | ICD-10-CM | POA: Diagnosis not present

## 2017-04-07 DIAGNOSIS — E1310 Other specified diabetes mellitus with ketoacidosis without coma: Secondary | ICD-10-CM | POA: Diagnosis not present

## 2017-04-07 DIAGNOSIS — E1159 Type 2 diabetes mellitus with other circulatory complications: Secondary | ICD-10-CM | POA: Diagnosis not present

## 2017-04-07 DIAGNOSIS — G9340 Encephalopathy, unspecified: Secondary | ICD-10-CM | POA: Diagnosis not present

## 2017-04-07 DIAGNOSIS — I2510 Atherosclerotic heart disease of native coronary artery without angina pectoris: Secondary | ICD-10-CM | POA: Diagnosis not present

## 2017-04-07 DIAGNOSIS — W19XXXA Unspecified fall, initial encounter: Secondary | ICD-10-CM | POA: Diagnosis not present

## 2017-04-07 DIAGNOSIS — E785 Hyperlipidemia, unspecified: Secondary | ICD-10-CM | POA: Diagnosis not present

## 2017-04-07 DIAGNOSIS — M810 Age-related osteoporosis without current pathological fracture: Secondary | ICD-10-CM | POA: Diagnosis not present

## 2017-04-07 DIAGNOSIS — E0810 Diabetes mellitus due to underlying condition with ketoacidosis without coma: Secondary | ICD-10-CM | POA: Diagnosis not present

## 2017-04-07 DIAGNOSIS — E43 Unspecified severe protein-calorie malnutrition: Secondary | ICD-10-CM | POA: Diagnosis not present

## 2017-04-07 DIAGNOSIS — H9110 Presbycusis, unspecified ear: Secondary | ICD-10-CM | POA: Diagnosis not present

## 2017-04-07 DIAGNOSIS — Z7982 Long term (current) use of aspirin: Secondary | ICD-10-CM | POA: Diagnosis not present

## 2017-04-07 DIAGNOSIS — E079 Disorder of thyroid, unspecified: Secondary | ICD-10-CM | POA: Diagnosis not present

## 2017-04-07 DIAGNOSIS — E1111 Type 2 diabetes mellitus with ketoacidosis with coma: Secondary | ICD-10-CM | POA: Diagnosis not present

## 2017-04-07 DIAGNOSIS — K589 Irritable bowel syndrome without diarrhea: Secondary | ICD-10-CM | POA: Diagnosis not present

## 2017-04-07 DIAGNOSIS — E876 Hypokalemia: Secondary | ICD-10-CM | POA: Diagnosis not present

## 2017-04-07 DIAGNOSIS — K219 Gastro-esophageal reflux disease without esophagitis: Secondary | ICD-10-CM | POA: Diagnosis not present

## 2017-04-07 DIAGNOSIS — E569 Vitamin deficiency, unspecified: Secondary | ICD-10-CM | POA: Diagnosis not present

## 2017-04-07 DIAGNOSIS — I219 Acute myocardial infarction, unspecified: Secondary | ICD-10-CM | POA: Diagnosis not present

## 2017-04-07 DIAGNOSIS — S199XXA Unspecified injury of neck, initial encounter: Secondary | ICD-10-CM | POA: Diagnosis not present

## 2017-04-07 DIAGNOSIS — Z79899 Other long term (current) drug therapy: Secondary | ICD-10-CM | POA: Diagnosis not present

## 2017-04-07 DIAGNOSIS — F0390 Unspecified dementia without behavioral disturbance: Secondary | ICD-10-CM | POA: Diagnosis present

## 2017-04-07 LAB — GLUCOSE, CAPILLARY
GLUCOSE-CAPILLARY: 275 mg/dL — AB (ref 65–99)
Glucose-Capillary: 238 mg/dL — ABNORMAL HIGH (ref 65–99)
Glucose-Capillary: 226 mg/dL — ABNORMAL HIGH (ref 65–99)

## 2017-04-07 MED ORDER — INSULIN GLARGINE 100 UNIT/ML ~~LOC~~ SOLN: 15.0000 [IU] | Freq: Every day | SUBCUTANEOUS | 0 refills | Status: DC

## 2017-04-07 MED ORDER — INSULIN ASPART 100 UNIT/ML ~~LOC~~ SOLN: 0.0000 [IU] | Freq: Three times a day (TID) | SUBCUTANEOUS | 0 refills | Status: DC

## 2017-04-07 MED ORDER — INSULIN ASPART 100 UNIT/ML ~~LOC~~ SOLN: 0.0000 [IU] | Freq: Every day | SUBCUTANEOUS | 0 refills | Status: DC

## 2017-04-07 MED ORDER — INSULIN ASPART 100 UNIT/ML ~~LOC~~ SOLN: 3.0000 [IU] | Freq: Three times a day (TID) | SUBCUTANEOUS | 0 refills | Status: DC

## 2017-04-07 NOTE — Care Management Important Message (Signed)
Important Message  Patient Details  Name: [name] [name] MRN: [id number] Date of Birth: 29-Apr-1932   Medicare Important Message Given:  N/A - LOS <3 / Initial given by admissions    Beverly Sessions, RN 04/07/2017, 1:58 PM

## 2017-04-07 NOTE — Discharge Summary (Addendum)
[name] [name] NAME: [name] [name]    MR#:  [id number]  DATE OF BIRTH:  Jul 26, 1932  DATE OF ADMISSION:  04/05/2017   ADMITTING PHYSICIAN: [name], MD  DATE OF DISCHARGE: 04/07/2017 PRIMARY CARE PHYSICIAN: [name], DO   ADMISSION DIAGNOSIS:  Hyperglycemia [R73.9] Altered mental status, unspecified altered mental status type [R41.82] DISCHARGE DIAGNOSIS:  Active Problems:   Metabolic encephalopathy  SECONDARY DIAGNOSIS:   Past Medical History:  Diagnosis Date  . Anxiety state, unspecified   . CAD (coronary artery disease)   . Cancer [name])    2004 Right breast, found on mammogram, radiation therapy, Dr. Bryson Ha, uterine cancer,   . COPD (chronic obstructive pulmonary disease) ([place])   . Diabetes mellitus   . Esophageal reflux   . Heart burn   . Hyperlipidemia   . IBS (irritable bowel syndrome)   . Myocardial infarction [name])    Cath negative except for 40% occlusion LAD.  Pt not candidate for betablocker or ACEI because of hypotension  . OSA (obstructive sleep apnea)   . Osteoporosis 02/21/09   DEXA scan showed osteoporosis with left femur T-score -2.8.  Marland Kitchen Presbyacusis   . Stroke ([name] [name])   . Thyroid disease    Hypothyroid  . Vitamin D deficiency    [name] COURSE:   [name] [name] a 83 y.o. femalewith a known history of CAD, COPD, diabetes on insulin, hypertension, hypothyroidism,early dementia and tremors presents from home due to altered mental status, nausea and vomiting.  #1 altered mental status-secondary to metabolic encephalopathy, elevated sugars.  CT head is unremarkable.  #2 uncontrolled diabetes mellitus with hyperglycemia Lantus was at 24 units up until recently, decreased to 9 units due to episodes of hypoglycemia from changes in eating habits. Continue lantus 15 units HS, added NovoLog 3 units before meals meals. Appreciate diabetes coordinator consult.  resume metformin.  #3  hypotension-chronic, on midodrine.  #4 hypothyroidism-on Synthroid  #5 DVT prophylaxis-on Lovenox  #6 ARF due to dehydration. Improved with IV fluid support.  Hypokalemia. Improved with potassium supplement.  Anemia of chronic disease. Stable.  Generalized weakness.  Physical therapy: SNF.  DISCHARGE CONDITIONS:  Stable, discharge to SNF today. CONSULTS OBTAINED:   DRUG ALLERGIES:   Allergies  Allergen Reactions  . Penicillins Rash    Has patient had a PCN reaction causing immediate rash, facial/tongue/throat swelling, SOB or lightheadedness with hypotension: No Has patient had a PCN reaction causing severe rash involving mucus membranes or skin necrosis: No Has patient had a PCN reaction that required hospitalization No Has patient had a PCN reaction occurring within the last 10 years: No If all of the above answers are "NO", then may proceed with Cephalosporin use.    DISCHARGE MEDICATIONS:   Allergies as of 04/07/2017      Reactions   Penicillins Rash   Has patient had a PCN reaction causing immediate rash, facial/tongue/throat swelling, SOB or lightheadedness with hypotension: No Has patient had a PCN reaction causing severe rash involving mucus membranes or skin necrosis: No Has patient had a PCN reaction that required hospitalization No Has patient had a PCN reaction occurring within the last 10 years: No If all of the above answers are "NO", then may proceed with Cephalosporin use.      Medication List    STOP taking these medications   insulin lispro 100 UNIT/ML injection Commonly known as:  HUMALOG     TAKE these medications   aspirin EC  81 MG tablet Take 1 tablet (81 mg total) by mouth daily.   atorvastatin 80 MG tablet Commonly known as:  LIPITOR Take 1 tablet (80 mg total) by mouth daily.   clopidogrel 75 MG tablet Commonly known as:  PLAVIX TAKE 1 TABLET(75 MG) BY MOUTH DAILY   fluticasone 50 MCG/ACT nasal spray Commonly known as:   FLONASE Place 2 sprays into both nostrils daily. What changed:  when to take this  reasons to take this   glucagon 1 MG injection Inject 1 mg into the vein once as needed.   insulin aspart 100 UNIT/ML injection Commonly known as:  novoLOG Inject 3 Units into the skin 3 (three) times daily with meals.   insulin aspart 100 UNIT/ML injection Commonly known as:  novoLOG Inject 0-5 Units into the skin at bedtime.   insulin aspart 100 UNIT/ML injection Commonly known as:  novoLOG Inject 0-9 Units into the skin 3 (three) times daily with meals.   insulin glargine 100 UNIT/ML injection Commonly known as:  LANTUS Inject 0.15 mLs (15 Units total) into the skin daily. What changed:  how much to take   levothyroxine 100 MCG tablet Commonly known as:  SYNTHROID, LEVOTHROID TAKE 1 TABLET(100 MCG) BY MOUTH DAILY   loperamide 2 MG capsule Commonly known as:  IMODIUM Take 4 mg by mouth as needed for diarrhea or loose stools.   metFORMIN 500 MG tablet Commonly known as:  GLUCOPHAGE TAKE 1 TABLET(500 MG) BY MOUTH TWICE DAILY WITH A MEAL   midodrine 10 MG tablet Commonly known as:  PROAMATINE Take 1 tablet (10 mg total) by mouth 3 (three) times daily with meals.   pantoprazole 40 MG tablet Commonly known as:  PROTONIX Take 1 tablet (40 mg total) by mouth 2 (two) times daily.   sertraline 25 MG tablet Commonly known as:  ZOLOFT Take 1 tablet (25 mg total) by mouth daily after breakfast.            Discharge Care Instructions        Start     Ordered   04/08/17 0000  insulin glargine (LANTUS) 100 UNIT/ML injection  Daily     04/07/17 1133   04/07/17 0000  Increase activity slowly     04/07/17 1131   04/07/17 0000  Diet - low sodium heart healthy     04/07/17 1131   04/07/17 0000  insulin aspart (NOVOLOG) 100 UNIT/ML injection  3 times daily with meals     04/07/17 1133   04/07/17 0000  insulin aspart (NOVOLOG) 100 UNIT/ML injection  Daily at bedtime     04/07/17 1735     04/07/17 0000  insulin aspart (NOVOLOG) 100 UNIT/ML injection  3 times daily with meals     04/07/17 1735       DISCHARGE INSTRUCTIONS:  See AVS.  If you experience worsening of your admission symptoms, develop shortness of breath, life threatening emergency, suicidal or homicidal thoughts you must seek medical attention immediately by calling 911 or calling your MD immediately  if symptoms less severe.  You Must read complete instructions/literature along with all the possible adverse reactions/side effects for all the Medicines you take and that have been prescribed to you. Take any new Medicines after you have completely understood and accpet all the possible adverse reactions/side effects.   Please note  You were cared for by a hospitalist during your [name] stay. If you have any questions about your discharge medications or the care you received while you  were in the [name] after you are discharged, you can call the unit and asked to speak with the hospitalist on call if the hospitalist that took care of you is not available. Once you are discharged, your primary care physician will handle any further medical issues. Please note that NO REFILLS for any discharge medications will be authorized once you are discharged, as it is imperative that you return to your primary care physician (or establish a relationship with a primary care physician if you do not have one) for your aftercare needs so that they can reassess your need for medications and monitor your lab values.    On the day of Discharge:  VITAL SIGNS:  Blood pressure (!) 149/68, pulse (!) 57, temperature 97.9 F (36.6 C), temperature source Oral, resp. rate 17, height 5\' 6"  (1.676 m), weight 139 lb 11.2 oz (63.4 kg), SpO2 97 %. PHYSICAL EXAMINATION:  GENERAL:  83 y.o.-year-old patient lying in the bed with no acute distress.  EYES: Pupils equal, round, reactive to light and accommodation. No scleral icterus. Extraocular  muscles intact.  HEENT: Head atraumatic, normocephalic. Oropharynx and nasopharynx clear.  NECK:  Supple, no jugular venous distention. No thyroid enlargement, no tenderness.  LUNGS: Normal breath sounds bilaterally, no wheezing, rales,rhonchi or crepitation. No use of accessory muscles of respiration.  CARDIOVASCULAR: S1, S2 normal. No murmurs, rubs, or gallops.  ABDOMEN: Soft, non-tender, non-distended. Bowel sounds present. No organomegaly or mass.  EXTREMITIES: No pedal edema, cyanosis, or clubbing.  NEUROLOGIC: Cranial nerves II through XII are intact. Muscle strength 4/5 in all extremities. Sensation intact. Gait not checked.  PSYCHIATRIC: The patient is alert and oriented x 2.  SKIN: No obvious rash, lesion, or ulcer.  DATA REVIEW:   CBC  Recent Labs Lab 04/06/17 0331  WBC 4.4  HGB 9.1*  HCT 26.1*  PLT 232    Chemistries   Recent Labs Lab 04/06/17 0331  NA 138  K 4.7  CL 109  CO2 23  GLUCOSE 208*  BUN 13  CREATININE 0.96  CALCIUM 8.6*     Microbiology Results  Results for orders placed or performed during the [name] encounter of 02/02/17  Urine culture     Status: None   Collection Time: 02/02/17  5:35 PM  Result Value Ref Range Status   Specimen Description URINE, CLEAN CATCH  Final   Special Requests NONE  Final   Culture   Final    NO GROWTH Performed at [name], [place] [address], [place], [place] [address]    Report Status 02/04/2017 FINAL  Final    RADIOLOGY:  No results found.   Management plans discussed with the patient, family and they are in agreement.  CODE STATUS: Full Code   TOTAL TIME TAKING CARE OF THIS PATIENT: 33 minutes.    Demetrios Loll M.D on 04/07/2017 at 11:34 AM  Between 7am to 6pm - Pager - [phone]  After 6pm go to [email or web address] - Proofreader  [facility] [employer] [facility]  Office  [phone]  CC: Primary care physician; [name], DO   Note: This dictation was prepared with  Dragon dictation along with smaller phrase technology. Any transcriptional errors that result from this process are unintentional.

## 2017-04-07 NOTE — Progress Notes (Signed)
[name] [name]  A and O x 2. VSS. Pt tolerating diet well. No complaints of pain or nausea. IV removed intact. Pt and family voiced understanding of discharge instructions with no further questions. Report called to [employer] [facility] and information included on new sliding scale on AVS. Pt discharged via wheelchair with nurse. Pt's family to transport by EMS.  Lynann Bologna MSN, RN-BC  Allergies as of 04/07/2017      Reactions   Penicillins Rash   Has patient had a PCN reaction causing immediate rash, facial/tongue/throat swelling, SOB or lightheadedness with hypotension: No Has patient had a PCN reaction causing severe rash involving mucus membranes or skin necrosis: No Has patient had a PCN reaction that required hospitalization No Has patient had a PCN reaction occurring within the last 10 years: No If all of the above answers are "NO", then may proceed with Cephalosporin use.      Medication List    STOP taking these medications   insulin lispro 100 UNIT/ML injection Commonly known as:  HUMALOG     TAKE these medications   aspirin EC 81 MG tablet Take 1 tablet (81 mg total) by mouth daily.   atorvastatin 80 MG tablet Commonly known as:  LIPITOR Take 1 tablet (80 mg total) by mouth daily.   clopidogrel 75 MG tablet Commonly known as:  PLAVIX TAKE 1 TABLET(75 MG) BY MOUTH DAILY   fluticasone 50 MCG/ACT nasal spray Commonly known as:  FLONASE Place 2 sprays into both nostrils daily. What changed:  when to take this  reasons to take this   glucagon 1 MG injection Inject 1 mg into the vein once as needed.   insulin aspart 100 UNIT/ML injection Commonly known as:  novoLOG Inject 3 Units into the skin 3 (three) times daily with meals.   insulin aspart 100 UNIT/ML injection Commonly known as:  novoLOG Inject 0-5 Units into the skin at bedtime.   insulin aspart 100 UNIT/ML injection Commonly known as:  novoLOG Inject 0-9 Units into the skin 3 (three) times daily  with meals.   insulin glargine 100 UNIT/ML injection Commonly known as:  LANTUS Inject 0.15 mLs (15 Units total) into the skin daily. What changed:  how much to take   levothyroxine 100 MCG tablet Commonly known as:  SYNTHROID, LEVOTHROID TAKE 1 TABLET(100 MCG) BY MOUTH DAILY   loperamide 2 MG capsule Commonly known as:  IMODIUM Take 4 mg by mouth as needed for diarrhea or loose stools.   metFORMIN 500 MG tablet Commonly known as:  GLUCOPHAGE TAKE 1 TABLET(500 MG) BY MOUTH TWICE DAILY WITH A MEAL   midodrine 10 MG tablet Commonly known as:  PROAMATINE Take 1 tablet (10 mg total) by mouth 3 (three) times daily with meals.   pantoprazole 40 MG tablet Commonly known as:  PROTONIX Take 1 tablet (40 mg total) by mouth 2 (two) times daily.   sertraline 25 MG tablet Commonly known as:  ZOLOFT Take 1 tablet (25 mg total) by mouth daily after breakfast.            Discharge Care Instructions        Start     Ordered   04/08/17 0000  insulin glargine (LANTUS) 100 UNIT/ML injection  Daily     04/07/17 1133   04/07/17 0000  Increase activity slowly     04/07/17 1131   04/07/17 0000  Diet - low sodium heart healthy     04/07/17 1131   04/07/17 0000  insulin aspart (NOVOLOG) 100 UNIT/ML injection  3 times daily with meals     04/07/17 1133   04/07/17 0000  insulin aspart (NOVOLOG) 100 UNIT/ML injection  Daily at bedtime     04/07/17 1735   04/07/17 0000  insulin aspart (NOVOLOG) 100 UNIT/ML injection  3 times daily with meals     04/07/17 1735      Vitals:   04/07/17 1045 04/07/17 1218  BP: (!) 149/68 139/69  Pulse:  (!) 54  Resp:  12  Temp:  97.9 F (36.6 C)  SpO2:  98%

## 2017-04-07 NOTE — Clinical Social Work Placement (Signed)
   CLINICAL SOCIAL WORK PLACEMENT  NOTE  Date:  04/07/2017  Patient Details  Name: [name] [name] MRN: [id number] Date of Birth: August 11, 1931  Clinical Social Work is seeking post-discharge placement for this patient at the [place] level of care (*CSW will initial, date and re-position this form in  chart as items are completed):  Yes   Patient/family provided with [facility] Work Department's list of facilities offering this level of care within the geographic area requested by the patient (or if unable, by the patient's family).  Yes   Patient/family informed of their freedom to choose among providers that offer the needed level of care, that participate in Medicare, Medicaid or managed care program needed by the patient, have an available bed and are willing to accept the patient.  Yes   Patient/family informed of [facility]'s ownership interest in [facility] and [facility], as well as of the fact that they are under no obligation to receive care at these facilities.  PASRR submitted to EDS on       PASRR number received on       Existing PASRR number confirmed on 04/06/17     FL2 transmitted to all facilities in geographic area requested by pt/family on 04/06/17     FL2 transmitted to all facilities within larger geographic area on       Patient informed that his/her managed care company has contracts with or will negotiate with certain facilities, including the following:        Yes   Patient/family informed of bed offers received.  Patient chooses bed at  [facility])     Physician recommends and patient chooses bed at  [facility])    Patient to be transferred to  [facility]) on 04/07/17.  Patient to be transferred to facility by  (granddaughter)     Patient family notified on 04/07/17 of transfer.  Name of family member notified:   [facility])     PHYSICIAN       Additional Comment:     _______________________________________________ Shela Leff, LCSW 04/07/2017, 11:58 AM

## 2017-04-07 NOTE — Discharge Instructions (Signed)
Heart healthy and ADA diet. °Fall precaution. °

## 2017-04-07 NOTE — Clinical Social Work Note (Signed)
Patient's granddaughter has spoken to [place], patient's son, and they have chosen [facility]. MD to discharge patient today. Discharge information sent to [employer]. Patient's granddaughter to transport. Shela Leff MSW,LCSW [phone]

## 2017-04-08 ENCOUNTER — Ambulatory Visit: Payer: Self-pay | Admitting: *Deleted

## 2017-04-08 DIAGNOSIS — R5381 Other malaise: Secondary | ICD-10-CM | POA: Diagnosis not present

## 2017-04-08 DIAGNOSIS — E1159 Type 2 diabetes mellitus with other circulatory complications: Secondary | ICD-10-CM | POA: Diagnosis not present

## 2017-04-08 DIAGNOSIS — E1151 Type 2 diabetes mellitus with diabetic peripheral angiopathy without gangrene: Secondary | ICD-10-CM | POA: Diagnosis not present

## 2017-04-08 DIAGNOSIS — I10 Essential (primary) hypertension: Secondary | ICD-10-CM | POA: Diagnosis not present

## 2017-04-08 LAB — BLOOD GAS, VENOUS
Acid-base deficit: 5.3 mmol/L — ABNORMAL HIGH (ref 0.0–2.0)
BICARBONATE: 21.2 mmol/L (ref 20.0–28.0)
PH VEN: 7.29 (ref 7.250–7.430)
Patient temperature: 37
pCO2, Ven: 44 mmHg (ref 44.0–60.0)

## 2017-04-10 ENCOUNTER — Other Ambulatory Visit: Payer: Self-pay | Admitting: Licensed Clinical Social Worker

## 2017-04-10 NOTE — Patient Outreach (Signed)
[facility]) Care Management  04/10/2017  TOMEEKA PLAUGHER [birth year] [id number]  CSW will be transferring case to [place] at this time. CSW has updated [facility] CSW [employer] on SNF admission at [facility].   Eula Fried, BSW, MSW, [email or web address] Phone: [phone] Fax: [phone]

## 2017-04-14 ENCOUNTER — Other Ambulatory Visit: Payer: Self-pay | Admitting: *Deleted

## 2017-04-14 NOTE — Patient Outreach (Signed)
[facility]) Care Management  04/14/2017  [name] [name] [birth year] [id number]   Phone call to [facility] Message left with discharge planner Trish Mage requesting an update on patient and anticipated discharge needs. Voicemail message left for a return call.   Sheralyn Boatman [facility] Care Management [phone]

## 2017-04-14 NOTE — Patient Outreach (Signed)
[facility]) Care Management  [facility] Social Work  04/14/2017  [name] [name] [birth year] [id number]  Subjective:  Patient is a 83 year old frmale currently in rehab at [facility] following a hospital stay due to altered mental state and elevated sugars. Patient able to identify herself by name only. Patient appeared very confused, unable to respond to questions logically.  Objective:   Encounter Medications:  Outpatient Encounter Prescriptions as of 04/14/2017  Medication Sig Note  . aspirin EC 81 MG tablet Take 1 tablet (81 mg total) by mouth daily.   Marland Kitchen atorvastatin (LIPITOR) 80 MG tablet Take 1 tablet (80 mg total) by mouth daily.   . clopidogrel (PLAVIX) 75 MG tablet TAKE 1 TABLET(75 MG) BY MOUTH DAILY   . fluticasone (FLONASE) 50 MCG/ACT nasal spray Place 2 sprays into both nostrils daily. (Patient taking differently: Place 2 sprays into both nostrils daily as needed. ) 03/31/2017: Available if needed.   Marland Kitchen glucagon 1 MG injection Inject 1 mg into the vein once as needed. 03/31/2017: Available if needed.   . insulin aspart (NOVOLOG) 100 UNIT/ML injection Inject 3 Units into the skin 3 (three) times daily with meals.   . insulin aspart (NOVOLOG) 100 UNIT/ML injection Inject 0-5 Units into the skin at bedtime.   . insulin aspart (NOVOLOG) 100 UNIT/ML injection Inject 0-9 Units into the skin 3 (three) times daily with meals.   . insulin glargine (LANTUS) 100 UNIT/ML injection Inject 0.15 mLs (15 Units total) into the skin daily.   Marland Kitchen levothyroxine (SYNTHROID, LEVOTHROID) 100 MCG tablet TAKE 1 TABLET(100 MCG) BY MOUTH DAILY   . loperamide (IMODIUM) 2 MG capsule Take 4 mg by mouth as needed for diarrhea or loose stools.   . metFORMIN (GLUCOPHAGE) 500 MG tablet TAKE 1 TABLET(500 MG) BY MOUTH TWICE DAILY WITH A MEAL   . midodrine (PROAMATINE) 10 MG tablet Take 1 tablet (10 mg total) by mouth 3 (three) times daily with meals.   . pantoprazole (PROTONIX) 40 MG tablet Take 1  tablet (40 mg total) by mouth 2 (two) times daily.   . sertraline (ZOLOFT) 25 MG tablet Take 1 tablet (25 mg total) by mouth daily after breakfast.    No facility-administered encounter medications on file as of 04/14/2017.     Functional Status:  In your present state of health, do you have any difficulty performing the following activities: 04/05/2017 04/05/2017  Hearing? [name] [name]  Vision? N N  Difficulty concentrating or making decisions? Y N  Walking or climbing stairs? N Y  Dressing or bathing? Y N  Doing errands, shopping? Y N  Preparing Food and eating ? - -  Comment - -  Using the Toilet? - -  In the past six months, have you accidently leaked urine? - -  Do you have problems with loss of bowel control? - -  Managing your Medications? - -  Managing your Finances? - -  Comment - -  Housekeeping or managing your Housekeeping? - -  Comment - -  Some recent data might be hidden    Fall/Depression Screening:  PHQ 2/9 Scores 12/15/2016 11/27/2016 08/15/2016 04/01/2016 01/04/2016 08/15/2014 07/26/2012  PHQ - 2 Score 1 3 0 0 0 0 0  PHQ- 9 Score - 8 - - - - -    Assessment: Phone call to discharge planner Trish Mage before visit for an update on patient's progress in rehab. Voicemail message left for a return call. Discharge planner not available when this  social worker arrived to visit patient.  Goals of care not established  due to patient's cognitive deficits.  Plan: This Education officer, museum will follow up with the discharge planner as well as patient's caregiver regarding her progress in rehab and anticipated long term plan.  Addendum: Met with discharge planner, Trish Mage on the way out of the building. Per Ivin Booty, based on patient's progress she will discharge home with granddaughter [name], or to [place] with her son. Per Ivin Booty, patient is participating minimally in rehab and is not able to travel at this time.  There has been no discharge date set yet. This social worker's phone number  provided for discharge planning information when available.   Sheralyn Boatman [facility] Care Management [phone]

## 2017-04-15 ENCOUNTER — Inpatient Hospital Stay
Admission: EM | Admit: 2017-04-15 | Discharge: 2017-04-21 | DRG: 637 | Disposition: A | Discharge status: Skilled Nursing Facility | Payer: Medicare Other | Attending: Internal Medicine | Admitting: Internal Medicine

## 2017-04-15 ENCOUNTER — Emergency Department: Payer: Medicare Other

## 2017-04-15 ENCOUNTER — Encounter: Payer: Self-pay | Admitting: Internal Medicine

## 2017-04-15 DIAGNOSIS — Z853 Personal history of malignant neoplasm of breast: Secondary | ICD-10-CM

## 2017-04-15 DIAGNOSIS — F0390 Unspecified dementia without behavioral disturbance: Secondary | ICD-10-CM | POA: Diagnosis present

## 2017-04-15 DIAGNOSIS — Z90710 Acquired absence of both cervix and uterus: Secondary | ICD-10-CM | POA: Diagnosis not present

## 2017-04-15 DIAGNOSIS — Z7401 Bed confinement status: Secondary | ICD-10-CM | POA: Diagnosis not present

## 2017-04-15 DIAGNOSIS — N950 Postmenopausal bleeding: Secondary | ICD-10-CM | POA: Diagnosis not present

## 2017-04-15 DIAGNOSIS — Z7902 Long term (current) use of antithrombotics/antiplatelets: Secondary | ICD-10-CM

## 2017-04-15 DIAGNOSIS — E1310 Other specified diabetes mellitus with ketoacidosis without coma: Secondary | ICD-10-CM | POA: Diagnosis not present

## 2017-04-15 DIAGNOSIS — I951 Orthostatic hypotension: Secondary | ICD-10-CM | POA: Diagnosis present

## 2017-04-15 DIAGNOSIS — G4733 Obstructive sleep apnea (adult) (pediatric): Secondary | ICD-10-CM | POA: Diagnosis present

## 2017-04-15 DIAGNOSIS — Z8249 Family history of ischemic heart disease and other diseases of the circulatory system: Secondary | ICD-10-CM | POA: Diagnosis not present

## 2017-04-15 DIAGNOSIS — E1165 Type 2 diabetes mellitus with hyperglycemia: Secondary | ICD-10-CM | POA: Diagnosis not present

## 2017-04-15 DIAGNOSIS — G9341 Metabolic encephalopathy: Secondary | ICD-10-CM | POA: Diagnosis present

## 2017-04-15 DIAGNOSIS — I10 Essential (primary) hypertension: Secondary | ICD-10-CM | POA: Diagnosis not present

## 2017-04-15 DIAGNOSIS — S52251D Displaced comminuted fracture of shaft of ulna, right arm, subsequent encounter for closed fracture with routine healing: Secondary | ICD-10-CM | POA: Diagnosis not present

## 2017-04-15 DIAGNOSIS — Z880 Allergy status to penicillin: Secondary | ICD-10-CM | POA: Diagnosis not present

## 2017-04-15 DIAGNOSIS — S199XXA Unspecified injury of neck, initial encounter: Secondary | ICD-10-CM | POA: Diagnosis not present

## 2017-04-15 DIAGNOSIS — N179 Acute kidney failure, unspecified: Secondary | ICD-10-CM

## 2017-04-15 DIAGNOSIS — R739 Hyperglycemia, unspecified: Secondary | ICD-10-CM | POA: Diagnosis not present

## 2017-04-15 DIAGNOSIS — E559 Vitamin D deficiency, unspecified: Secondary | ICD-10-CM | POA: Diagnosis not present

## 2017-04-15 DIAGNOSIS — Z8542 Personal history of malignant neoplasm of other parts of uterus: Secondary | ICD-10-CM

## 2017-04-15 DIAGNOSIS — K219 Gastro-esophageal reflux disease without esophagitis: Secondary | ICD-10-CM | POA: Diagnosis not present

## 2017-04-15 DIAGNOSIS — W19XXXA Unspecified fall, initial encounter: Secondary | ICD-10-CM | POA: Diagnosis not present

## 2017-04-15 DIAGNOSIS — J449 Chronic obstructive pulmonary disease, unspecified: Secondary | ICD-10-CM | POA: Diagnosis present

## 2017-04-15 DIAGNOSIS — F329 Major depressive disorder, single episode, unspecified: Secondary | ICD-10-CM | POA: Diagnosis present

## 2017-04-15 DIAGNOSIS — E875 Hyperkalemia: Secondary | ICD-10-CM | POA: Diagnosis present

## 2017-04-15 DIAGNOSIS — S52351D Displaced comminuted fracture of shaft of radius, right arm, subsequent encounter for closed fracture with routine healing: Secondary | ICD-10-CM | POA: Diagnosis not present

## 2017-04-15 DIAGNOSIS — M810 Age-related osteoporosis without current pathological fracture: Secondary | ICD-10-CM | POA: Diagnosis not present

## 2017-04-15 DIAGNOSIS — R52 Pain, unspecified: Secondary | ICD-10-CM

## 2017-04-15 DIAGNOSIS — S4991XA Unspecified injury of right shoulder and upper arm, initial encounter: Secondary | ICD-10-CM | POA: Diagnosis not present

## 2017-04-15 DIAGNOSIS — E876 Hypokalemia: Secondary | ICD-10-CM | POA: Diagnosis present

## 2017-04-15 DIAGNOSIS — W19XXXD Unspecified fall, subsequent encounter: Secondary | ICD-10-CM | POA: Diagnosis not present

## 2017-04-15 DIAGNOSIS — Y92129 Unspecified place in nursing home as the place of occurrence of the external cause: Secondary | ICD-10-CM | POA: Diagnosis not present

## 2017-04-15 DIAGNOSIS — E039 Hypothyroidism, unspecified: Secondary | ICD-10-CM | POA: Diagnosis not present

## 2017-04-15 DIAGNOSIS — E1110 Type 2 diabetes mellitus with ketoacidosis without coma: Secondary | ICD-10-CM | POA: Diagnosis not present

## 2017-04-15 DIAGNOSIS — I2510 Atherosclerotic heart disease of native coronary artery without angina pectoris: Secondary | ICD-10-CM | POA: Diagnosis present

## 2017-04-15 DIAGNOSIS — K589 Irritable bowel syndrome without diarrhea: Secondary | ICD-10-CM | POA: Diagnosis not present

## 2017-04-15 DIAGNOSIS — E43 Unspecified severe protein-calorie malnutrition: Secondary | ICD-10-CM | POA: Diagnosis not present

## 2017-04-15 DIAGNOSIS — Z7982 Long term (current) use of aspirin: Secondary | ICD-10-CM | POA: Diagnosis not present

## 2017-04-15 DIAGNOSIS — Z6822 Body mass index (BMI) 22.0-22.9, adult: Secondary | ICD-10-CM

## 2017-04-15 DIAGNOSIS — E870 Hyperosmolality and hypernatremia: Secondary | ICD-10-CM | POA: Diagnosis not present

## 2017-04-15 DIAGNOSIS — S0990XA Unspecified injury of head, initial encounter: Secondary | ICD-10-CM | POA: Diagnosis not present

## 2017-04-15 DIAGNOSIS — Z794 Long term (current) use of insulin: Secondary | ICD-10-CM | POA: Diagnosis not present

## 2017-04-15 DIAGNOSIS — Z8673 Personal history of transient ischemic attack (TIA), and cerebral infarction without residual deficits: Secondary | ICD-10-CM

## 2017-04-15 DIAGNOSIS — G9340 Encephalopathy, unspecified: Secondary | ICD-10-CM | POA: Diagnosis not present

## 2017-04-15 DIAGNOSIS — S299XXA Unspecified injury of thorax, initial encounter: Secondary | ICD-10-CM | POA: Diagnosis not present

## 2017-04-15 DIAGNOSIS — E785 Hyperlipidemia, unspecified: Secondary | ICD-10-CM | POA: Diagnosis not present

## 2017-04-15 DIAGNOSIS — E0810 Diabetes mellitus due to underlying condition with ketoacidosis without coma: Secondary | ICD-10-CM | POA: Diagnosis not present

## 2017-04-15 DIAGNOSIS — M816 Localized osteoporosis [Lequesne]: Secondary | ICD-10-CM | POA: Diagnosis not present

## 2017-04-15 DIAGNOSIS — Z79899 Other long term (current) drug therapy: Secondary | ICD-10-CM

## 2017-04-15 DIAGNOSIS — M25511 Pain in right shoulder: Secondary | ICD-10-CM | POA: Diagnosis not present

## 2017-04-15 DIAGNOSIS — I252 Old myocardial infarction: Secondary | ICD-10-CM

## 2017-04-15 DIAGNOSIS — E1111 Type 2 diabetes mellitus with ketoacidosis with coma: Principal | ICD-10-CM | POA: Diagnosis present

## 2017-04-15 DIAGNOSIS — M6281 Muscle weakness (generalized): Secondary | ICD-10-CM | POA: Diagnosis not present

## 2017-04-15 DIAGNOSIS — Z9181 History of falling: Secondary | ICD-10-CM | POA: Diagnosis not present

## 2017-04-15 DIAGNOSIS — F411 Generalized anxiety disorder: Secondary | ICD-10-CM | POA: Diagnosis present

## 2017-04-15 DIAGNOSIS — E1151 Type 2 diabetes mellitus with diabetic peripheral angiopathy without gangrene: Secondary | ICD-10-CM | POA: Diagnosis not present

## 2017-04-15 DIAGNOSIS — E860 Dehydration: Secondary | ICD-10-CM | POA: Diagnosis present

## 2017-04-15 LAB — HEPATIC FUNCTION PANEL
ALK PHOS: 115 U/L (ref 38–126)
ALT: 22 U/L (ref 14–54)
AST: 24 U/L (ref 15–41)
Albumin: 4.6 g/dL (ref 3.5–5.0)
BILIRUBIN DIRECT: 0.3 mg/dL (ref 0.1–0.5)
BILIRUBIN INDIRECT: 1.8 mg/dL — AB (ref 0.3–0.9)
Total Bilirubin: 2.1 mg/dL — ABNORMAL HIGH (ref 0.3–1.2)
Total Protein: 8.3 g/dL — ABNORMAL HIGH (ref 6.5–8.1)

## 2017-04-15 LAB — BASIC METABOLIC PANEL
Anion gap: 23 — ABNORMAL HIGH (ref 5–15)
BUN: 43 mg/dL — ABNORMAL HIGH (ref 6–20)
CHLORIDE: 99 mmol/L — AB (ref 101–111)
CO2: 15 mmol/L — ABNORMAL LOW (ref 22–32)
CREATININE: 2.01 mg/dL — AB (ref 0.44–1.00)
Calcium: 9.7 mg/dL (ref 8.9–10.3)
GFR calc Af Amer: 25 mL/min — ABNORMAL LOW (ref >60)
GFR, EST NON AFRICAN AMERICAN: 22 mL/min — AB (ref >60)
GLUCOSE: 720 mg/dL — AB (ref 65–99)
POTASSIUM: 5.5 mmol/L — AB (ref 3.5–5.1)
Sodium: 137 mmol/L (ref 135–145)

## 2017-04-15 LAB — CBC WITH DIFFERENTIAL/PLATELET
BASOS ABS: 0 10*3/uL (ref 0–0.1)
BASOS PCT: 0 %
EOS ABS: 0 10*3/uL (ref 0–0.7)
Eosinophils Relative: 0 %
HCT: 31.1 % — ABNORMAL LOW (ref 35.0–47.0)
Hemoglobin: 10.2 g/dL — ABNORMAL LOW (ref 12.0–16.0)
Lymphocytes Relative: 7 %
Lymphs Abs: 0.6 10*3/uL — ABNORMAL LOW (ref 1.0–3.6)
MCH: 31.8 pg (ref 26.0–34.0)
MCHC: 32.6 g/dL (ref 32.0–36.0)
MCV: 97.6 fL (ref 80.0–100.0)
MONO ABS: 0.3 10*3/uL (ref 0.2–0.9)
MONOS PCT: 5 %
Neutro Abs: 6.7 10*3/uL — ABNORMAL HIGH (ref 1.4–6.5)
Neutrophils Relative %: 88 %
PLATELETS: 276 10*3/uL (ref 150–440)
RBC: 3.19 MIL/uL — ABNORMAL LOW (ref 3.80–5.20)
RDW: 16.4 % — AB (ref 11.5–14.5)
WBC: 7.7 10*3/uL (ref 3.6–11.0)

## 2017-04-15 LAB — URINALYSIS, COMPLETE (UACMP) WITH MICROSCOPIC
BACTERIA UA: NONE SEEN
BILIRUBIN URINE: NEGATIVE
Ketones, ur: 20 mg/dL — AB
LEUKOCYTES UA: NEGATIVE
NITRITE: NEGATIVE
Protein, ur: NEGATIVE mg/dL
Specific Gravity, Urine: 1.025 (ref 1.005–1.030)
pH: 5 (ref 5.0–8.0)

## 2017-04-15 LAB — BLOOD GAS, VENOUS
Acid-base deficit: 12.8 mmol/L — ABNORMAL HIGH (ref 0.0–2.0)
BICARBONATE: 12.1 mmol/L — AB (ref 20.0–28.0)
FIO2: 0.21
LHR: 26 {breaths}/min
O2 SAT: 62.4 %
PATIENT TEMPERATURE: 37
PO2 VEN: 36 mmHg (ref 32.0–45.0)
pCO2, Ven: 24 mmHg — ABNORMAL LOW (ref 44.0–60.0)
pH, Ven: 7.31 (ref 7.250–7.430)

## 2017-04-15 LAB — GLUCOSE, CAPILLARY
GLUCOSE-CAPILLARY: 459 mg/dL — AB (ref 65–99)
GLUCOSE-CAPILLARY: 323 mg/dL — AB (ref 65–99)
Glucose-Capillary: >600 mg/dL (ref 65–99)

## 2017-04-15 LAB — CK: CK TOTAL: 65 U/L (ref 38–234)

## 2017-04-15 LAB — MRSA PCR SCREENING: MRSA by PCR: NEGATIVE

## 2017-04-15 LAB — TROPONIN I

## 2017-04-15 MED ORDER — DEXTROSE-NACL 5-0.45 % IV SOLN
INTRAVENOUS | Status: DC
  Administered 2017-04-16: via INTRAVENOUS

## 2017-04-15 MED ORDER — ONDANSETRON HCL 4 MG/2ML IJ SOLN
4.0000 mg | Freq: Four times a day (QID) | INTRAMUSCULAR | Status: DC | PRN
  Administered 2017-04-19: 4 mg via INTRAVENOUS
  Filled 2017-04-15: qty 2

## 2017-04-15 MED ORDER — SODIUM CHLORIDE 0.9 % IV SOLN
INTRAVENOUS | Status: DC
  Administered 2017-04-15: 5.4 [IU]/h via INTRAVENOUS
  Filled 2017-04-15: qty 1

## 2017-04-15 MED ORDER — MIDODRINE HCL 5 MG PO TABS
10.0000 mg | ORAL_TABLET | Freq: Three times a day (TID) | ORAL | Status: DC
  Administered 2017-04-18 – 2017-04-21 (×9): 10 mg via ORAL
  Filled 2017-04-15 (×16): qty 2

## 2017-04-15 MED ORDER — ATORVASTATIN CALCIUM 20 MG PO TABS
80.0000 mg | ORAL_TABLET | Freq: Every day | ORAL | Status: DC
  Administered 2017-04-18 – 2017-04-21 (×4): 80 mg via ORAL
  Filled 2017-04-15 (×4): qty 4

## 2017-04-15 MED ORDER — SERTRALINE HCL 50 MG PO TABS
25.0000 mg | ORAL_TABLET | Freq: Every day | ORAL | Status: DC
  Administered 2017-04-18 – 2017-04-21 (×4): 25 mg via ORAL
  Filled 2017-04-15 (×4): qty 1

## 2017-04-15 MED ORDER — PANTOPRAZOLE SODIUM 40 MG PO TBEC
40.0000 mg | DELAYED_RELEASE_TABLET | Freq: Two times a day (BID) | ORAL | Status: DC
  Administered 2017-04-16 – 2017-04-19 (×5): 40 mg via ORAL
  Filled 2017-04-15 (×5): qty 1

## 2017-04-15 MED ORDER — CLOPIDOGREL BISULFATE 75 MG PO TABS
75.0000 mg | ORAL_TABLET | Freq: Every day | ORAL | Status: DC
  Administered 2017-04-18 – 2017-04-21 (×4): 75 mg via ORAL
  Filled 2017-04-15 (×4): qty 1

## 2017-04-15 MED ORDER — LEVOTHYROXINE SODIUM 100 MCG PO TABS
100.0000 ug | ORAL_TABLET | Freq: Every day | ORAL | Status: DC
  Administered 2017-04-18 – 2017-04-21 (×4): 100 ug via ORAL
  Filled 2017-04-15 (×4): qty 1

## 2017-04-15 MED ORDER — DEXTROSE-NACL 5-0.45 % IV SOLN
INTRAVENOUS | Status: DC
  Administered 2017-04-15: 20:00:00 via INTRAVENOUS

## 2017-04-15 MED ORDER — SODIUM CHLORIDE 0.9 % IV SOLN
INTRAVENOUS | Status: DC
  Filled 2017-04-15: qty 1

## 2017-04-15 MED ORDER — ACETAMINOPHEN 650 MG RE SUPP: 650.0000 mg | Freq: Four times a day (QID) | RECTAL | Status: DC | PRN

## 2017-04-15 MED ORDER — ACETAMINOPHEN 325 MG PO TABS: 650.0000 mg | ORAL_TABLET | Freq: Four times a day (QID) | ORAL | Status: DC | PRN

## 2017-04-15 MED ORDER — HEPARIN SODIUM (PORCINE) 5000 UNIT/ML IJ SOLN
5000.0000 [IU] | Freq: Three times a day (TID) | INTRAMUSCULAR | Status: DC
  Administered 2017-04-16 – 2017-04-20 (×16): 5000 [IU] via SUBCUTANEOUS
  Filled 2017-04-15 (×16): qty 1

## 2017-04-15 MED ORDER — ASPIRIN EC 81 MG PO TBEC
81.0000 mg | DELAYED_RELEASE_TABLET | Freq: Every day | ORAL | Status: DC
  Administered 2017-04-18 – 2017-04-21 (×4): 81 mg via ORAL
  Filled 2017-04-15 (×4): qty 1

## 2017-04-15 MED ORDER — SODIUM CHLORIDE 0.9 % IV SOLN
INTRAVENOUS | Status: DC
  Administered 2017-04-15: 22:00:00 via INTRAVENOUS

## 2017-04-15 MED ORDER — ONDANSETRON HCL 4 MG PO TABS: 4.0000 mg | ORAL_TABLET | Freq: Four times a day (QID) | ORAL | Status: DC | PRN

## 2017-04-15 MED ORDER — SODIUM CHLORIDE 0.9 % IV BOLUS (SEPSIS)
1000.0000 mL | Freq: Once | INTRAVENOUS | Status: AC
  Administered 2017-04-15: 1000 mL via INTRAVENOUS

## 2017-04-15 MED ORDER — SODIUM CHLORIDE 0.9 % IV SOLN: INTRAVENOUS | Status: AC

## 2017-04-15 NOTE — Progress Notes (Signed)
eLink Physician-Brief Progress Note Patient Name: [name] [name] DOB: [birth year] MRN: [id number]   Date of Service  04/15/2017  HPI/Events of Note  83 yo female admitted with DKA. Already on an insulin IV infusion. PCCM asked to see in consult and assume care. VSS. No new orders.   eICU Interventions  Continue present management.      Intervention Category Major Interventions: Acid-Base disturbance - evaluation and management;Hyperglycemia - active titration of insulin therapy  Lysle Dingwall 04/15/2017, 8:52 PM

## 2017-04-15 NOTE — ED Notes (Signed)
Date and time results received: 04/15/17 1836  Test: Blood glucose Critical Value: 720  Name of Provider Notified: Dr. Mable Paris

## 2017-04-15 NOTE — ED Notes (Signed)
Purewick placed on pt. Delilah Shan, RN assisted with in &out cath on pt. Pt tolerated well.
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
## 2017-04-15 NOTE — ED Notes (Signed)
Patient transported to CT 

## 2017-04-15 NOTE — ED Triage Notes (Signed)
Pt arrived via [facility] from [facility]. Family found out today upon visiting patient that she had a fall at the facility x 3 days ago. Pt has bruising to right shoulder, right arm, right forehead and a knot with redness on the back of her head. Per EMS, CBG "high" per meter. Pt alert to self.

## 2017-04-15 NOTE — H&P (Signed)
[place] [facility] [place] NAME: [name] [name]    MR#:  [id number]  DATE OF BIRTH:  [birth year]  DATE OF ADMISSION:  04/15/2017  PRIMARY CARE PHYSICIAN: [name] Spikes, DO   REQUESTING/REFERRING PHYSICIAN: Dr. Darel [name]  CHIEF COMPLAINT:   Chief Complaint  Patient presents with  . Fall    HISTORY OF PRESENT ILLNESS:  [name] [name]  is a 83 y.o. female with a known history of Coronary artery disease, diabetes, COPD, history of breast cancer, hyperlipidemia, history of previous MI, obstructive sleep apnea, dementia, osteoporosis and history of previous CVA who presents to the hospital due to altered mental status. Patient herself is a very poor historian therefore most of the history obtained from the chart. Patient apparently had a fall 3 days ago and was noted to have significant bruising on her head but no lacerations or any further trauma and therefore was sent to the ER for further evaluations. Patient in the emergency room was noted to be significantly hyperglycemic and noted to be in acute diabetic ketoacidosis with acute kidney injury and therefore hospitalist services were consulted for treatment evaluation.  PAST MEDICAL HISTORY:   Past Medical History:  Diagnosis Date  . Anxiety state, unspecified   . CAD (coronary artery disease)   . Cancer [facility])    2004 Right breast, found on mammogram, radiation therapy, Dr. Bryson Ha, uterine cancer,   . COPD (chronic obstructive pulmonary disease) ([place])   . Diabetes mellitus   . Esophageal reflux   . Heart burn   . Hyperlipidemia   . IBS (irritable bowel syndrome)   . Myocardial infarction [facility])    Cath negative except for 40% occlusion LAD.  Pt not candidate for betablocker or ACEI because of hypotension  . OSA (obstructive sleep apnea)   . Osteoporosis 02/21/09   DEXA scan showed osteoporosis with left femur T-score -2.8.  Marland Kitchen Presbyacusis   . Stroke ([place])   . Thyroid disease     Hypothyroid  . Vitamin D deficiency     PAST SURGICAL HISTORY:   Past Surgical History:  Procedure Laterality Date  . ABDOMINAL HYSTERECTOMY     uterine cancer  . BREAST SURGERY    . COLON SURGERY  2013   done at [place]:   Social History  Substance Use Topics  . Smoking status: Never Smoker  . Smokeless tobacco: Never Used  . Alcohol use No    FAMILY HISTORY:   Family History  Problem Relation Age of Onset  . Diabetes Mother   . Heart attack Father     DRUG ALLERGIES:   Allergies  Allergen Reactions  . Penicillins Rash    Has patient had a PCN reaction causing immediate rash, facial/tongue/throat swelling, SOB or lightheadedness with hypotension: No Has patient had a PCN reaction causing severe rash involving mucus membranes or skin necrosis: No Has patient had a PCN reaction that required hospitalization No Has patient had a PCN reaction occurring within the last 10 years: No If all of the above answers are "NO", then may proceed with Cephalosporin use.     REVIEW OF SYSTEMS:   Review of Systems  Unable to perform ROS: Mental acuity    MEDICATIONS AT HOME:   Prior to Admission medications   Medication Sig Start Date End Date Taking? Authorizing Provider  aspirin EC 81 MG tablet Take 1 tablet (81 mg total) by mouth daily. 04/09/16   [name] Spikes,  DO  atorvastatin (LIPITOR) 80 MG tablet Take 1 tablet (80 mg total) by mouth daily. 08/21/16   [name] Spikes, DO  clopidogrel (PLAVIX) 75 MG tablet TAKE 1 TABLET(75 MG) BY MOUTH DAILY 01/01/17   Cook, Jayce G, DO  fluticasone (FLONASE) 50 MCG/ACT nasal spray Place 2 sprays into both nostrils daily. Patient taking differently: Place 2 sprays into both nostrils daily as needed.  01/01/17   [name] Spikes, DO  glucagon 1 MG injection Inject 1 mg into the vein once as needed.    [provider]  insulin aspart (NOVOLOG) 100 UNIT/ML injection Inject 3 Units into the skin 3 (three) times daily with  meals. 04/07/17   Demetrios Loll, MD  insulin aspart (NOVOLOG) 100 UNIT/ML injection Inject 0-5 Units into the skin at bedtime. 04/07/17   Demetrios Loll, MD  insulin aspart (NOVOLOG) 100 UNIT/ML injection Inject 0-9 Units into the skin 3 (three) times daily with meals. 04/07/17   Demetrios Loll, MD  insulin glargine (LANTUS) 100 UNIT/ML injection Inject 0.15 mLs (15 Units total) into the skin daily. 04/08/17   Demetrios Loll, MD  levothyroxine (SYNTHROID, LEVOTHROID) 100 MCG tablet TAKE 1 TABLET(100 MCG) BY MOUTH DAILY 01/01/17   [name] Spikes, DO  loperamide (IMODIUM) 2 MG capsule Take 4 mg by mouth as needed for diarrhea or loose stools.    [provider]  metFORMIN (GLUCOPHAGE) 500 MG tablet TAKE 1 TABLET(500 MG) BY MOUTH TWICE DAILY WITH A MEAL 10/20/16   Cook, Jayce G, DO  midodrine (PROAMATINE) 10 MG tablet Take 1 tablet (10 mg total) by mouth 3 (three) times daily with meals. 01/30/17   [name] Spikes, DO  pantoprazole (PROTONIX) 40 MG tablet Take 1 tablet (40 mg total) by mouth 2 (two) times daily. 01/04/16   Jackolyn Confer, MD  sertraline (ZOLOFT) 25 MG tablet Take 1 tablet (25 mg total) by mouth daily after breakfast. 02/17/17   Fritzi Mandes, MD      VITAL SIGNS:  Blood pressure 119/62, pulse (!) 103, temperature 98.4 F (36.9 C), temperature source Oral, resp. rate (!) 25, height 5\' 6"  (1.676 m), weight 60.3 kg (133 lb), SpO2 99 %.  PHYSICAL EXAMINATION:  Physical Exam  GENERAL:  83 y.o.-year-old patient lying in the bed altered but in NAD.   EYES: Pupils equal, round, reactive to light and accommodation. No scleral icterus. Extraocular muscles intact.  HEENT: Head atraumatic, normocephalic. Oropharynx and nasopharynx clear. No oropharyngeal erythema, dry oral mucosa  NECK:  Supple, no jugular venous distention. No thyroid enlargement, no tenderness.  LUNGS: Normal breath sounds bilaterally, no wheezing, rales, rhonchi. No use of accessory muscles of respiration.  CARDIOVASCULAR: S1, S2 RRR. No  murmurs, rubs, gallops, clicks.  ABDOMEN: Soft, nontender, nondistended. Bowel sounds present. No organomegaly or mass.  EXTREMITIES: No pedal edema, cyanosis, or clubbing. + 2 pedal & radial pulses b/l.   NEUROLOGIC: Cranial nerves II through XII are intact. No focal Motor or sensory deficits appreciated b/l. Globally weak PSYCHIATRIC: The patient is alert and oriented x 1.   SKIN: No obvious rash, lesion, or ulcer.   LABORATORY PANEL:   CBC  Recent Labs Lab 04/15/17 1748  WBC 7.7  HGB 10.2*  HCT 31.1*  PLT 276   ------------------------------------------------------------------------------------------------------------------  Chemistries   Recent Labs Lab 04/15/17 1748  NA 137  K 5.5*  CL 99*  CO2 15*  GLUCOSE 720*  BUN 43*  CREATININE 2.01*  CALCIUM 9.7  AST 24  ALT 22  ALKPHOS 115  BILITOT 2.1*   ------------------------------------------------------------------------------------------------------------------  Cardiac Enzymes  Recent Labs Lab 04/15/17 1748  TROPONINI <0.03   ------------------------------------------------------------------------------------------------------------------  RADIOLOGY:  Ct Head Wo Contrast  Result Date: 04/15/2017 CLINICAL DATA:  Fall EXAM: CT HEAD WITHOUT CONTRAST CT CERVICAL SPINE WITHOUT CONTRAST TECHNIQUE: Multidetector CT imaging of the head and cervical spine was performed following the standard protocol without intravenous contrast. Multiplanar CT image reconstructions of the cervical spine were also generated. COMPARISON:  CT head dated 04/05/2017 FINDINGS: CT HEAD FINDINGS Brain: No evidence of acute infarction, hemorrhage, extra-axial collection or mass lesion/mass effect. Global cortical and central atrophy. Secondary ventricular prominence. Extensive subcortical white matter and periventricular small vessel ischemic changes. Vascular: No hyperdense vessel or unexpected calcification. Skull: Normal. Negative for  fracture or focal lesion. Sinuses/Orbits: The visualized paranasal sinuses are essentially clear. The mastoid air cells are unopacified. Other: None. CT CERVICAL SPINE FINDINGS Alignment: Normal cervical lordosis. Skull base and vertebrae: No acute fracture. No primary bone lesion or focal pathologic process. Soft tissues and spinal canal: No prevertebral fluid or swelling. No visible canal hematoma. Disc levels: Mild degenerative changes of the mid/lower cervical spine. Spinal canal is patent. Upper chest: Visualized lung apices are clear. Other: Visualized thyroid is unremarkable. IMPRESSION: No evidence of acute intracranial abnormality. Atrophy with small vessel ischemic changes. No evidence of traumatic injury to the cervical spine. Mild degenerative changes. Electronically Signed   By: Julian Hy M.D.   On: 04/15/2017 18:50   Ct Cervical Spine Wo Contrast  Result Date: 04/15/2017 CLINICAL DATA:  Fall EXAM: CT HEAD WITHOUT CONTRAST CT CERVICAL SPINE WITHOUT CONTRAST TECHNIQUE: Multidetector CT imaging of the head and cervical spine was performed following the standard protocol without intravenous contrast. Multiplanar CT image reconstructions of the cervical spine were also generated. COMPARISON:  CT head dated 04/05/2017 FINDINGS: CT HEAD FINDINGS Brain: No evidence of acute infarction, hemorrhage, extra-axial collection or mass lesion/mass effect. Global cortical and central atrophy. Secondary ventricular prominence. Extensive subcortical white matter and periventricular small vessel ischemic changes. Vascular: No hyperdense vessel or unexpected calcification. Skull: Normal. Negative for fracture or focal lesion. Sinuses/Orbits: The visualized paranasal sinuses are essentially clear. The mastoid air cells are unopacified. Other: None. CT CERVICAL SPINE FINDINGS Alignment: Normal cervical lordosis. Skull base and vertebrae: No acute fracture. No primary bone lesion or focal pathologic process. Soft  tissues and spinal canal: No prevertebral fluid or swelling. No visible canal hematoma. Disc levels: Mild degenerative changes of the mid/lower cervical spine. Spinal canal is patent. Upper chest: Visualized lung apices are clear. Other: Visualized thyroid is unremarkable. IMPRESSION: No evidence of acute intracranial abnormality. Atrophy with small vessel ischemic changes. No evidence of traumatic injury to the cervical spine. Mild degenerative changes. Electronically Signed   By: Julian Hy M.D.   On: 04/15/2017 18:50   Dg Chest Port 1 View  Result Date: 04/15/2017 CLINICAL DATA:  Fall EXAM: PORTABLE CHEST 1 VIEW COMPARISON:  04/05/2017 FINDINGS: Lungs are clear.  No pleural effusion or pneumothorax. The heart is normal in size. IMPRESSION: No evidence of acute cardiopulmonary disease. Electronically Signed   By: Julian Hy M.D.   On: 04/15/2017 18:47     IMPRESSION AND PLAN:   83 y.o. female with a known history of Coronary artery disease, diabetes, COPD, history of breast cancer, hyperlipidemia, history of previous MI, obstructive sleep apnea, dementia, osteoporosis and history of previous CVA who presents to the hospital due to altered mental status.  1. Altered mental status-metabolic encephalopathy  secondary to the diabetic ketoacidosis and hyperglycemia. -CT head is negative for acute pathology. We'll give aggressive IV fluids, started on insulin drip, correct sugars and follow mental status.  2. Acute diabetic ketoacidosis-etiology unclear. No clear infectious source identified presently. -We will place on IV insulin drip, IV fluids, follow serial metabolic profiles. Follow serial electrolytes. Adjust and replaced electrolytes accordingly. - We'll consult diabetes coordinator.  3. Acute kidney injury-secondary to dehydration and acute diabetic ketoacidosis. We'll hydrate the patient with IV fluids, follow BUN and creatinine and urine output. Renal dose meds, avoid  nephrotoxins.  4. Hypothyroidism - cont. Synthroid.   5. Hx of previous CVA - cont. ASA, Plavx, Statin.   6. Hyperlipidemia - cont. Atorvastatin.   7. Depression - cont. Zoloft.     All the records are reviewed and case discussed with ED provider. Management plans discussed with the patient, family and they are in agreement.  CODE STATUS: Full code  TOTAL Critical Care TIME TAKING CARE OF THIS PATIENT: 45 minutes.    Henreitta Leber M.D on 04/15/2017 at 7:29 PM  Between 7am to 6pm - Pager - [phone]  After 6pm go to [email or web address] - password [email or web address]  [facility]  Office  [phone]  CC: Primary care physician; [name] Spikes, DO

## 2017-04-15 NOTE — Consult Note (Signed)
Name: [name] [name] MRN: [id number] DOB: Jun 19, 1932    ADMISSION DATE:  04/15/2017  CONSULTATION DATE:  04/15/17  REFERRING MD :  Dr. Verdell Carmine  CHIEF COMPLAINT:  Fall  BRIEF PATIENT DESCRIPTION: 83 year old female with Acute DKA  SIGNIFICANT EVENTS  9/19 Patient admitted to the SDU with DKA on insulin gtt  STUDIES:  9/19 CT head>>No evidence of acute intracranial abnormality. Atrophy with small vessel ischemic changes.  HISTORY OF PRESENT ILLNESS:  [name] [name] is an 83 year old female with known history of CAD,COPD,DM,OSA,Hyperlipidemia,MI,CVA and thyroid disease. Patient presented to ED with altered mental status. Apparently patient had a fall 3 days ago and was noted to have some significant bruising on her head.  CT of the head was negative for any injury. In the ER she was noted with the blood glucose of 720mg /dl.  Patient was started on Insulin gtt and sent to the SDU for further monitoring.  PAST MEDICAL HISTORY :   has a past medical history of Anxiety state, unspecified; CAD (coronary artery disease); Cancer [facility]); COPD (chronic obstructive pulmonary disease) ([place]); Diabetes mellitus; Esophageal reflux; Heart burn; Hyperlipidemia; IBS (irritable bowel syndrome); Myocardial infarction ([place]); OSA (obstructive sleep apnea); Osteoporosis (02/21/09); Presbyacusis; Stroke [facility]); Thyroid disease; and Vitamin D deficiency.  has a past surgical history that includes Abdominal hysterectomy; Breast surgery; and Colon surgery (2013). Prior to Admission medications   Medication Sig Start Date End Date Taking? Authorizing Provider  aspirin EC 81 MG tablet Take 1 tablet (81 mg total) by mouth daily. 04/09/16   [name], DO  atorvastatin (LIPITOR) 80 MG tablet Take 1 tablet (80 mg total) by mouth daily. 08/21/16   [name], DO  clopidogrel (PLAVIX) 75 MG tablet TAKE 1 TABLET(75 MG) BY MOUTH DAILY 01/01/17   Cook, Jayce G, DO  fluticasone (FLONASE) 50 MCG/ACT nasal spray Place 2  sprays into both nostrils daily. Patient taking differently: Place 2 sprays into both nostrils daily as needed.  01/01/17   [name], DO  glucagon 1 MG injection Inject 1 mg into the vein once as needed.    [provider]  insulin aspart (NOVOLOG) 100 UNIT/ML injection Inject 3 Units into the skin 3 (three) times daily with meals. 04/07/17   Demetrios Loll, MD  insulin aspart (NOVOLOG) 100 UNIT/ML injection Inject 0-5 Units into the skin at bedtime. 04/07/17   Demetrios Loll, MD  insulin aspart (NOVOLOG) 100 UNIT/ML injection Inject 0-9 Units into the skin 3 (three) times daily with meals. 04/07/17   Demetrios Loll, MD  insulin glargine (LANTUS) 100 UNIT/ML injection Inject 0.15 mLs (15 Units total) into the skin daily. 04/08/17   Demetrios Loll, MD  levothyroxine (SYNTHROID, LEVOTHROID) 100 MCG tablet TAKE 1 TABLET(100 MCG) BY MOUTH DAILY 01/01/17   [name], DO  loperamide (IMODIUM) 2 MG capsule Take 4 mg by mouth as needed for diarrhea or loose stools.    [provider]  metFORMIN (GLUCOPHAGE) 500 MG tablet TAKE 1 TABLET(500 MG) BY MOUTH TWICE DAILY WITH A MEAL 10/20/16   Cook, Jayce G, DO  midodrine (PROAMATINE) 10 MG tablet Take 1 tablet (10 mg total) by mouth 3 (three) times daily with meals. 01/30/17   [name], DO  pantoprazole (PROTONIX) 40 MG tablet Take 1 tablet (40 mg total) by mouth 2 (two) times daily. 01/04/16   Jackolyn Confer, MD  sertraline (ZOLOFT) 25 MG tablet Take 1 tablet (25 mg total) by mouth daily after  breakfast. 02/17/17   Fritzi Mandes, MD   Allergies  Allergen Reactions  . Penicillins Rash    Has patient had a PCN reaction causing immediate rash, facial/tongue/throat swelling, SOB or lightheadedness with hypotension: No Has patient had a PCN reaction causing severe rash involving mucus membranes or skin necrosis: No Has patient had a PCN reaction that required hospitalization No Has patient had a PCN reaction occurring within the last 10 years: No If all of the  above answers are "NO", then may proceed with Cephalosporin use.     FAMILY HISTORY:  family history includes Diabetes in her mother; Heart attack in her father. SOCIAL HISTORY:  reports that she has never smoked. She has never used smokeless tobacco. She reports that she does not drink alcohol or use drugs.  REVIEW OF SYSTEMS:   Constitutional: Negative for fever, chills, weight loss, malaise/fatigue and diaphoresis.  HENT: Negative for hearing loss, ear pain, nosebleeds, congestion, sore throat, neck pain, tinnitus and ear discharge.   Eyes: Negative for blurred vision, double vision, photophobia, pain, discharge and redness.  Respiratory: Negative for cough, hemoptysis, sputum production, shortness of breath, wheezing and stridor.   Cardiovascular: Negative for chest pain, palpitations, orthopnea, claudication, leg swelling and PND.  Gastrointestinal: Negative for heartburn, nausea, vomiting, abdominal pain, diarrhea, constipation, blood in stool and melena.  Genitourinary: Negative for dysuria, urgency, frequency, hematuria and flank pain.  Musculoskeletal: Negative for myalgias, back pain, joint pain and falls.  Skin: Negative for itching and rash.  Neurological: Negative for dizziness, tingling, tremors, sensory change, speech change, focal weakness, seizures, loss of consciousness, weakness and headaches.  Endo/Heme/Allergies: Negative for environmental allergies and polydipsia. Does not bruise/bleed easily.  SUBJECTIVE: Elderly confused female, in no acute distress  VITAL SIGNS: Temp:  [98.4 F (36.9 C)] 98.4 F (36.9 C) (09/19 1747) Pulse Rate:  [102-104] 104 (09/19 2000) Resp:  [21-25] 24 (09/19 2000) BP: (119-129)/(59-69) 125/69 (09/19 2000) SpO2:  [98 %-100 %] 100 % (09/19 2000) Weight:  [60.3 kg (133 lb)] 60.3 kg (133 lb) (09/19 1748)  PHYSICAL EXAMINATION: General:  Elderly confused female, in no acute distress Neuro: Awake but confused HEENT:  AT,[place],No  jvd Cardiovascular:  Tachycardia,regular,no m/r/g Lungs:  Clear bilaterally, no wheezes,crackles,rhonchi noted Abdomen: Soft,NT,ND Musculoskeletal:  No edema,cyanosis noted Skin:  Warm,dry and intact   Recent Labs Lab 04/15/17 1748  NA 137  K 5.5*  CL 99*  CO2 15*  BUN 43*  CREATININE 2.01*  GLUCOSE 720*    Recent Labs Lab 04/15/17 1748  HGB 10.2*  HCT 31.1*  WBC 7.7  PLT 276   Ct Head Wo Contrast  Result Date: 04/15/2017 CLINICAL DATA:  Fall EXAM: CT HEAD WITHOUT CONTRAST CT CERVICAL SPINE WITHOUT CONTRAST TECHNIQUE: Multidetector CT imaging of the head and cervical spine was performed following the standard protocol without intravenous contrast. Multiplanar CT image reconstructions of the cervical spine were also generated. COMPARISON:  CT head dated 04/05/2017 FINDINGS: CT HEAD FINDINGS Brain: No evidence of acute infarction, hemorrhage, extra-axial collection or mass lesion/mass effect. Global cortical and central atrophy. Secondary ventricular prominence. Extensive subcortical white matter and periventricular small vessel ischemic changes. Vascular: No hyperdense vessel or unexpected calcification. Skull: Normal. Negative for fracture or focal lesion. Sinuses/Orbits: The visualized paranasal sinuses are essentially clear. The mastoid air cells are unopacified. Other: None. CT CERVICAL SPINE FINDINGS Alignment: Normal cervical lordosis. Skull base and vertebrae: No acute fracture. No primary bone lesion or focal pathologic process. Soft tissues and spinal canal: No prevertebral  fluid or swelling. No visible canal hematoma. Disc levels: Mild degenerative changes of the mid/lower cervical spine. Spinal canal is patent. Upper chest: Visualized lung apices are clear. Other: Visualized thyroid is unremarkable. IMPRESSION: No evidence of acute intracranial abnormality. Atrophy with small vessel ischemic changes. No evidence of traumatic injury to the cervical spine. Mild degenerative  changes. Electronically Signed   By: Julian Hy M.D.   On: 04/15/2017 18:50   Ct Cervical Spine Wo Contrast  Result Date: 04/15/2017 CLINICAL DATA:  Fall EXAM: CT HEAD WITHOUT CONTRAST CT CERVICAL SPINE WITHOUT CONTRAST TECHNIQUE: Multidetector CT imaging of the head and cervical spine was performed following the standard protocol without intravenous contrast. Multiplanar CT image reconstructions of the cervical spine were also generated. COMPARISON:  CT head dated 04/05/2017 FINDINGS: CT HEAD FINDINGS Brain: No evidence of acute infarction, hemorrhage, extra-axial collection or mass lesion/mass effect. Global cortical and central atrophy. Secondary ventricular prominence. Extensive subcortical white matter and periventricular small vessel ischemic changes. Vascular: No hyperdense vessel or unexpected calcification. Skull: Normal. Negative for fracture or focal lesion. Sinuses/Orbits: The visualized paranasal sinuses are essentially clear. The mastoid air cells are unopacified. Other: None. CT CERVICAL SPINE FINDINGS Alignment: Normal cervical lordosis. Skull base and vertebrae: No acute fracture. No primary bone lesion or focal pathologic process. Soft tissues and spinal canal: No prevertebral fluid or swelling. No visible canal hematoma. Disc levels: Mild degenerative changes of the mid/lower cervical spine. Spinal canal is patent. Upper chest: Visualized lung apices are clear. Other: Visualized thyroid is unremarkable. IMPRESSION: No evidence of acute intracranial abnormality. Atrophy with small vessel ischemic changes. No evidence of traumatic injury to the cervical spine. Mild degenerative changes. Electronically Signed   By: Julian Hy M.D.   On: 04/15/2017 18:50   Dg Chest Port 1 View  Result Date: 04/15/2017 CLINICAL DATA:  Fall EXAM: PORTABLE CHEST 1 VIEW COMPARISON:  04/05/2017 FINDINGS: Lungs are clear.  No pleural effusion or pneumothorax. The heart is normal in size. IMPRESSION: No  evidence of acute cardiopulmonary disease. Electronically Signed   By: Julian Hy M.D.   On: 04/15/2017 18:47    ASSESSMENT / PLAN:  Acute Diabetic Ketoacidosis Metabolic encephalopathy related to elevated blood glucose levels Acute kidney injury related to dehydration secondary to DKA Hyperkalemia Hx of Hypothyroidism Hx of CVA Hx of depression Hx of Hyperlipidemia   Plan Continue I/V fluids Continue Insulin gtt D51/2 NS when blood glucose 250mg /dl transition to s/q insulin when CO2>20 and AG<12 Hyperkalemia will resolve by itself when blood glucose is better controlled Continue Synthroid Continue Aspirin ,Plavix and Synthroid Continue Atorvastatin Continue Zoloft   Bincy Varughese,AG-ACNP Pulmonary and [place]   04/15/2017, 8:36 PM   Merton Border, MD PCCM service Mobile [phone] Pager [phone] 04/16/2017 2:49 PM

## 2017-04-16 ENCOUNTER — Inpatient Hospital Stay: Payer: Medicare Other

## 2017-04-16 ENCOUNTER — Ambulatory Visit: Payer: Medicare Other | Admitting: *Deleted

## 2017-04-16 ENCOUNTER — Ambulatory Visit: Payer: Self-pay | Admitting: *Deleted

## 2017-04-16 ENCOUNTER — Other Ambulatory Visit: Payer: Self-pay | Admitting: *Deleted

## 2017-04-16 DIAGNOSIS — E0810 Diabetes mellitus due to underlying condition with ketoacidosis without coma: Secondary | ICD-10-CM

## 2017-04-16 LAB — BASIC METABOLIC PANEL
ANION GAP: 10 (ref 5–15)
Anion gap: 15 (ref 5–15)
Anion gap: 9 (ref 5–15)
BUN: 46 mg/dL — AB (ref 6–20)
BUN: 49 mg/dL — AB (ref 6–20)
BUN: 51 mg/dL — AB (ref 6–20)
CALCIUM: 9.7 mg/dL (ref 8.9–10.3)
CHLORIDE: 109 mmol/L (ref 101–111)
CHLORIDE: 110 mmol/L (ref 101–111)
CO2: 23 mmol/L (ref 22–32)
CO2: 24 mmol/L (ref 22–32)
CO2: 26 mmol/L (ref 22–32)
CREATININE: 1.9 mg/dL — AB (ref 0.44–1.00)
Calcium: 10.1 mg/dL (ref 8.9–10.3)
Calcium: 10.1 mg/dL (ref 8.9–10.3)
Chloride: 111 mmol/L (ref 101–111)
Creatinine, Ser: 1.73 mg/dL — ABNORMAL HIGH (ref 0.44–1.00)
Creatinine, Ser: 1.63 mg/dL — ABNORMAL HIGH (ref 0.44–1.00)
GFR calc Af Amer: 27 mL/min — ABNORMAL LOW (ref >60)
GFR calc Af Amer: 30 mL/min — ABNORMAL LOW (ref >60)
GFR calc Af Amer: 33 mL/min — ABNORMAL LOW (ref >60)
GFR calc non Af Amer: 23 mL/min — ABNORMAL LOW (ref >60)
GFR calc non Af Amer: 28 mL/min — ABNORMAL LOW (ref >60)
GFR, EST NON AFRICAN AMERICAN: 26 mL/min — AB (ref >60)
GLUCOSE: 149 mg/dL — AB (ref 65–99)
GLUCOSE: 165 mg/dL — AB (ref 65–99)
Glucose, Bld: 221 mg/dL — ABNORMAL HIGH (ref 65–99)
POTASSIUM: 3.1 mmol/L — AB (ref 3.5–5.1)
POTASSIUM: 3.3 mmol/L — AB (ref 3.5–5.1)
POTASSIUM: 4 mmol/L (ref 3.5–5.1)
Sodium: 147 mmol/L — ABNORMAL HIGH (ref 135–145)
Sodium: 146 mmol/L — ABNORMAL HIGH (ref 135–145)
Sodium: 144 mmol/L (ref 135–145)

## 2017-04-16 LAB — CBC
HEMATOCRIT: 31.6 % — AB (ref 35.0–47.0)
Hemoglobin: 11 g/dL — ABNORMAL LOW (ref 12.0–16.0)
MCH: 32.2 pg (ref 26.0–34.0)
MCHC: 34.8 g/dL (ref 32.0–36.0)
MCV: 92.4 fL (ref 80.0–100.0)
PLATELETS: 346 10*3/uL (ref 150–440)
RBC: 3.42 MIL/uL — AB (ref 3.80–5.20)
RDW: 16 % — ABNORMAL HIGH (ref 11.5–14.5)
WBC: 9.5 10*3/uL (ref 3.6–11.0)

## 2017-04-16 LAB — GLUCOSE, CAPILLARY
GLUCOSE-CAPILLARY: 112 mg/dL — AB (ref 65–99)
GLUCOSE-CAPILLARY: 131 mg/dL — AB (ref 65–99)
GLUCOSE-CAPILLARY: 135 mg/dL — AB (ref 65–99)
GLUCOSE-CAPILLARY: 149 mg/dL — AB (ref 65–99)
GLUCOSE-CAPILLARY: 155 mg/dL — AB (ref 65–99)
GLUCOSE-CAPILLARY: 164 mg/dL — AB (ref 65–99)
GLUCOSE-CAPILLARY: 224 mg/dL — AB (ref 65–99)
Glucose-Capillary: 205 mg/dL — ABNORMAL HIGH (ref 65–99)
Glucose-Capillary: 172 mg/dL — ABNORMAL HIGH (ref 65–99)
Glucose-Capillary: 154 mg/dL — ABNORMAL HIGH (ref 65–99)
Glucose-Capillary: 137 mg/dL — ABNORMAL HIGH (ref 65–99)
Glucose-Capillary: 149 mg/dL — ABNORMAL HIGH (ref 65–99)
Glucose-Capillary: 173 mg/dL — ABNORMAL HIGH (ref 65–99)
Glucose-Capillary: 177 mg/dL — ABNORMAL HIGH (ref 65–99)

## 2017-04-16 MED ORDER — INSULIN GLARGINE 100 UNIT/ML ~~LOC~~ SOLN
12.0000 [IU] | Freq: Every day | SUBCUTANEOUS | Status: DC
  Administered 2017-04-16 – 2017-04-18 (×3): 12 [IU] via SUBCUTANEOUS
  Filled 2017-04-16 (×4): qty 0.12

## 2017-04-16 MED ORDER — CHLORHEXIDINE GLUCONATE 0.12 % MT SOLN
15.0000 mL | Freq: Two times a day (BID) | OROMUCOSAL | Status: DC
  Administered 2017-04-17 – 2017-04-21 (×10): 15 mL via OROMUCOSAL
  Filled 2017-04-16 (×7): qty 15

## 2017-04-16 MED ORDER — MORPHINE SULFATE (PF) 2 MG/ML IV SOLN: 0.5000 mg | INTRAVENOUS | Status: DC | PRN

## 2017-04-16 MED ORDER — MORPHINE SULFATE (PF) 2 MG/ML IV SOLN
1.0000 mg | Freq: Once | INTRAVENOUS | Status: AC
  Administered 2017-04-16: 1 mg via INTRAVENOUS
  Filled 2017-04-16: qty 1

## 2017-04-16 MED ORDER — POTASSIUM CHLORIDE 10 MEQ/100ML IV SOLN
10.0000 meq | INTRAVENOUS | Status: AC
  Administered 2017-04-16 (×4): 10 meq via INTRAVENOUS
  Filled 2017-04-16 (×4): qty 100

## 2017-04-16 MED ORDER — INSULIN ASPART 100 UNIT/ML ~~LOC~~ SOLN: 2.0000 [IU] | Freq: Three times a day (TID) | SUBCUTANEOUS | Status: DC

## 2017-04-16 MED ORDER — INSULIN ASPART 100 UNIT/ML ~~LOC~~ SOLN
0.0000 [IU] | SUBCUTANEOUS | Status: DC
  Administered 2017-04-16: 2 [IU] via SUBCUTANEOUS
  Administered 2017-04-16: 1 [IU] via SUBCUTANEOUS
  Administered 2017-04-17: 3 [IU] via SUBCUTANEOUS
  Filled 2017-04-16 (×3): qty 1

## 2017-04-16 MED ORDER — ORAL CARE MOUTH RINSE
15.0000 mL | Freq: Two times a day (BID) | OROMUCOSAL | Status: DC
  Administered 2017-04-17 – 2017-04-19 (×2): 15 mL via OROMUCOSAL

## 2017-04-16 NOTE — Patient Outreach (Signed)
[facility]) Care Management  04/16/2017  KEATYN LUCK [birth year] [id number]   Phone call yesterday from patient's granddaughter [name], who stated that during a visit to see patient at [facility], she noticed that patient had a bruise on her forehead, upper back and on her right arm. Per [name], the staff member stated that patient had fallen 3 days ago and may have sustained those injuries then. Per [name], she was not contacted 3 days, and the injuries were never reported to her.  [name] states that she was adamant that patient be taken by ambulance to the hospital to be evaluated. [name] does not want patient to return to [facility], however does agree that patient needs 24 hour long term care. Patient's son also in agreement.  Per [name], she has reported her concerns to the Nursing Supervisor at [facility].  This social worker visited patient on 04/14/17 and noticed no bruising on patient's forehead.  Plan: This Education officer, museum will assist with long term care arrangements and will reports concerns to the appropriate entities.   Sheralyn Boatman [facility] Care Management [phone]
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
## 2017-04-16 NOTE — Progress Notes (Signed)
[place] [facility] [place] NAME: Hang Ammon    MR#:  [id number]  DATE OF BIRTH:  [birth year]  SUBJECTIVE:   Patient remains confused. Granddaughter is at bedside.  REVIEW OF SYSTEMS:    Unable to obtain due to alteration in mental status.  Tolerating Diet:npo      DRUG ALLERGIES:   Allergies  Allergen Reactions  . Penicillins Rash    Has patient had a PCN reaction causing immediate rash, facial/tongue/throat swelling, SOB or lightheadedness with hypotension: No Has patient had a PCN reaction causing severe rash involving mucus membranes or skin necrosis: No Has patient had a PCN reaction that required hospitalization No Has patient had a PCN reaction occurring within the last 10 years: No If all of the above answers are "NO", then may proceed with Cephalosporin use.     VITALS:  Blood pressure 123/70, pulse (!) 105, temperature 98.2 F (36.8 C), temperature source Axillary, resp. rate 14, height 5\' 6"  (1.676 m), weight 60.3 kg (132 lb 15 oz), SpO2 98 %.  PHYSICAL EXAMINATION:  Constitutional: Appears well-developed and well-nourished. No distress. HENT: Normocephalic. Marland Kitchen Oropharynx is clear and moist.  Eyes: Conjunctivae and EOM are normal. PERRLA, no scleral icterus.  Neck: Normal ROM. Neck supple. No JVD. No tracheal deviation. CVS: RRR, S1/S2 +, no murmurs, no gallops, no carotid bruit.  Pulmonary: Effort and breath sounds normal, no stridor, rhonchi, wheezes, rales.  Abdominal: Soft. BS +,  no distension, tenderness, rebound or guarding.  Musculoskeletal: Normal range of motion. No edema and no tenderness.  Neuro: Lethargic  Skin: Skin is warm and dry. No rash noted. Psychiatric: Lethargic    LABORATORY PANEL:   CBC  Recent Labs Lab 04/16/17 0057  WBC 9.5  HGB 11.0*  HCT 31.6*  PLT 346    ------------------------------------------------------------------------------------------------------------------  Chemistries   Recent Labs Lab 04/15/17 1748  04/16/17 0851  NA 137  < > 144  K 5.5*  < > 4.0  CL 99*  < > 110  CO2 15*  < > 24  GLUCOSE 720*  < > 165*  BUN 43*  < > 46*  CREATININE 2.01*  < > 1.63*  CALCIUM 9.7  < > 9.7  AST 24  --   --   ALT 22  --   --   ALKPHOS 115  --   --   BILITOT 2.1*  --   --   < > = values in this interval not displayed. ------------------------------------------------------------------------------------------------------------------  Cardiac Enzymes  Recent Labs Lab 04/15/17 1748  TROPONINI <0.03   ------------------------------------------------------------------------------------------------------------------  RADIOLOGY:  Ct Head Wo Contrast  Result Date: 04/15/2017 CLINICAL DATA:  Fall EXAM: CT HEAD WITHOUT CONTRAST CT CERVICAL SPINE WITHOUT CONTRAST TECHNIQUE: Multidetector CT imaging of the head and cervical spine was performed following the standard protocol without intravenous contrast. Multiplanar CT image reconstructions of the cervical spine were also generated. COMPARISON:  CT head dated 04/05/2017 FINDINGS: CT HEAD FINDINGS Brain: No evidence of acute infarction, hemorrhage, extra-axial collection or mass lesion/mass effect. Global cortical and central atrophy. Secondary ventricular prominence. Extensive subcortical white matter and periventricular small vessel ischemic changes. Vascular: No hyperdense vessel or unexpected calcification. Skull: Normal. Negative for fracture or focal lesion. Sinuses/Orbits: The visualized paranasal sinuses are essentially clear. The mastoid air cells are unopacified. Other: None. CT CERVICAL SPINE FINDINGS Alignment: Normal cervical lordosis. Skull base and vertebrae: No acute fracture. No primary bone lesion or focal pathologic process. Soft tissues and spinal  canal: No prevertebral fluid or  swelling. No visible canal hematoma. Disc levels: Mild degenerative changes of the mid/lower cervical spine. Spinal canal is patent. Upper chest: Visualized lung apices are clear. Other: Visualized thyroid is unremarkable. IMPRESSION: No evidence of acute intracranial abnormality. Atrophy with small vessel ischemic changes. No evidence of traumatic injury to the cervical spine. Mild degenerative changes. Electronically Signed   By: Julian Hy M.D.   On: 04/15/2017 18:50   Ct Cervical Spine Wo Contrast  Result Date: 04/15/2017 CLINICAL DATA:  Fall EXAM: CT HEAD WITHOUT CONTRAST CT CERVICAL SPINE WITHOUT CONTRAST TECHNIQUE: Multidetector CT imaging of the head and cervical spine was performed following the standard protocol without intravenous contrast. Multiplanar CT image reconstructions of the cervical spine were also generated. COMPARISON:  CT head dated 04/05/2017 FINDINGS: CT HEAD FINDINGS Brain: No evidence of acute infarction, hemorrhage, extra-axial collection or mass lesion/mass effect. Global cortical and central atrophy. Secondary ventricular prominence. Extensive subcortical white matter and periventricular small vessel ischemic changes. Vascular: No hyperdense vessel or unexpected calcification. Skull: Normal. Negative for fracture or focal lesion. Sinuses/Orbits: The visualized paranasal sinuses are essentially clear. The mastoid air cells are unopacified. Other: None. CT CERVICAL SPINE FINDINGS Alignment: Normal cervical lordosis. Skull base and vertebrae: No acute fracture. No primary bone lesion or focal pathologic process. Soft tissues and spinal canal: No prevertebral fluid or swelling. No visible canal hematoma. Disc levels: Mild degenerative changes of the mid/lower cervical spine. Spinal canal is patent. Upper chest: Visualized lung apices are clear. Other: Visualized thyroid is unremarkable. IMPRESSION: No evidence of acute intracranial abnormality. Atrophy with small vessel ischemic  changes. No evidence of traumatic injury to the cervical spine. Mild degenerative changes. Electronically Signed   By: Julian Hy M.D.   On: 04/15/2017 18:50   Dg Chest Port 1 View  Result Date: 04/15/2017 CLINICAL DATA:  Fall EXAM: PORTABLE CHEST 1 VIEW COMPARISON:  04/05/2017 FINDINGS: Lungs are clear.  No pleural effusion or pneumothorax. The heart is normal in size. IMPRESSION: No evidence of acute cardiopulmonary disease. Electronically Signed   By: Julian Hy M.D.   On: 04/15/2017 18:47     ASSESSMENT AND PLAN:   83 year old female with a history of diabetes, COPD, CVA and mild dementia who presents from skilled nursing facility with altered mental status.   1. Acute metabolic encephalopathy in the setting of diabetic ketoacidosis and possible CVA DKA has resolved however patient remains encephalopathic. Discussed case with the intensivist MRI ordered Follow neuro status  2. DKA: This is resolved Transitioned to subcutaneous insulin as requested by diabetes coordinator.  3. Acute kidney injury in the setting of dehydration and DKA: Creatinine improving  4. Vaginal bleeding as per nursing report: OB/GYN consultation requested  5. Hypokalemia: This is been repleted  6. Hypothyroidism: Continue Synthroid  7. History of orthostatic hypotension: Continue Midodrine  8. History of previous CVA: Continue aspirin, Plavix and statin  9. Hyperlipidemia: Continue statin  10. Depression: Continue Zoloft  11. Mild dementia as per granddaughter Continue to monitor Management plans discussed with the patient;s granddaughter and she is in agreement.  CODE STATUS: FULL  TOTAL TIME TAKING CARE OF THIS PATIENT: 30 minutes.     POSSIBLE D/C 2-3 days, DEPENDING ON CLINICAL CONDITION.   Kasiya Burck M.D on 04/16/2017 at 11:25 AM  Between 7am to 6pm - Pager - [phone] After 6pm go to [email or web address] - password [email or web address] [facility]  Office   [phone]  CC: Primary care physician;  [name], DO  Note: This dictation was prepared with Dragon dictation along with smaller phrase technology. Any transcriptional errors that result from this process are unintentional.

## 2017-04-16 NOTE — Progress Notes (Signed)
Patient to be transported to radiology for MRI.

## 2017-04-16 NOTE — Plan of Care (Signed)
Problem: Health Behavior/Discharge Planning: Goal: Ability to manage health-related needs will improve Outcome: Progressing Patient able to come off of Insulin Drip, Labs improving  Problem: Physical Regulation: Goal: Ability to maintain clinical measurements within normal limits will improve Outcome: Progressing Vital signs stable Goal: Will remain free from infection Outcome: Progressing No new signs or symptoms of infection noted  Problem: Fluid Volume: Goal: Ability to maintain a balanced intake and output will improve Patient maintained NPO; awaiting speech evaluation and decrease in lethargy; patient's mental status still not back at baseline

## 2017-04-16 NOTE — Progress Notes (Signed)
MD and NP made aware of vaginal bleed w/ small clots noted during AM bath- GYN consult placed for patient

## 2017-04-16 NOTE — Progress Notes (Addendum)
Name: [name] [name] MRN: [id number] DOB: [birth year]    BRIEF PATIENT DESCRIPTION: 83 year old female admitted 09/19 with post fall at nursing facility, DKA on insulin drip, acute renal failure, acute encephalopathy secondary to DKA vs. CVA  SIGNIFICANT EVENTS  9/19 Patient admitted to the SDU with DKA on insulin gtt  STUDIES:  9/19 CT head>>No evidence of acute intracranial abnormality. Atrophy with small vessel ischemic changes.  SUBJECTIVE:  Pt resting in bed no acute distress   VITAL SIGNS: Temp:  [98.1 F (36.7 C)-99.5 F (37.5 C)] 98.2 F (36.8 C) (09/20 0800) Pulse Rate:  [96-110] 105 (09/20 0400) Resp:  [13-25] 14 (09/20 0400) BP: (114-157)/(52-92) 123/70 (09/20 0800) SpO2:  [95 %-100 %] 98 % (09/20 0800) Weight:  [60.3 kg (132 lb 15 oz)-60.3 kg (133 lb)] 60.3 kg (132 lb 15 oz) (09/19 2050)  PHYSICAL EXAMINATION: General: Elderly confused female, NAD  Neuro: Awake but confused not following commands, PERRL HEENT: AT, [place], no JVD Cardiovascular: nsr, regular, no M/R/G Lungs:  Clear bilaterally, no wheezes,crackles,rhonchi noted Abdomen: Soft,NT,ND Musculoskeletal:  No edema,cyanosis noted Skin:  Warm,dry and intact   Recent Labs Lab 04/16/17 0057 04/16/17 0528 04/16/17 0851  NA 147* 146* 144  K 3.1* 3.3* 4.0  CL 109 111 110  CO2 23 26 24   BUN 51* 49* 46*  CREATININE 1.90* 1.73* 1.63*  GLUCOSE 221* 149* 165*    Recent Labs Lab 04/15/17 1748 04/16/17 0057  HGB 10.2* 11.0*  HCT 31.1* 31.6*  WBC 7.7 9.5  PLT 276 346   Ct Head Wo Contrast  Result Date: 04/15/2017 CLINICAL DATA:  Fall EXAM: CT HEAD WITHOUT CONTRAST CT CERVICAL SPINE WITHOUT CONTRAST TECHNIQUE: Multidetector CT imaging of the head and cervical spine was performed following the standard protocol without intravenous contrast. Multiplanar CT image reconstructions of the cervical spine were also generated. COMPARISON:  CT head dated 04/05/2017 FINDINGS: CT HEAD FINDINGS Brain: No  evidence of acute infarction, hemorrhage, extra-axial collection or mass lesion/mass effect. Global cortical and central atrophy. Secondary ventricular prominence. Extensive subcortical white matter and periventricular small vessel ischemic changes. Vascular: No hyperdense vessel or unexpected calcification. Skull: Normal. Negative for fracture or focal lesion. Sinuses/Orbits: The visualized paranasal sinuses are essentially clear. The mastoid air cells are unopacified. Other: None. CT CERVICAL SPINE FINDINGS Alignment: Normal cervical lordosis. Skull base and vertebrae: No acute fracture. No primary bone lesion or focal pathologic process. Soft tissues and spinal canal: No prevertebral fluid or swelling. No visible canal hematoma. Disc levels: Mild degenerative changes of the mid/lower cervical spine. Spinal canal is patent. Upper chest: Visualized lung apices are clear. Other: Visualized thyroid is unremarkable. IMPRESSION: No evidence of acute intracranial abnormality. Atrophy with small vessel ischemic changes. No evidence of traumatic injury to the cervical spine. Mild degenerative changes. Electronically Signed   By: Julian Hy M.D.   On: 04/15/2017 18:50   Ct Cervical Spine Wo Contrast  Result Date: 04/15/2017 CLINICAL DATA:  Fall EXAM: CT HEAD WITHOUT CONTRAST CT CERVICAL SPINE WITHOUT CONTRAST TECHNIQUE: Multidetector CT imaging of the head and cervical spine was performed following the standard protocol without intravenous contrast. Multiplanar CT image reconstructions of the cervical spine were also generated. COMPARISON:  CT head dated 04/05/2017 FINDINGS: CT HEAD FINDINGS Brain: No evidence of acute infarction, hemorrhage, extra-axial collection or mass lesion/mass effect. Global cortical and central atrophy. Secondary ventricular prominence. Extensive subcortical white matter and periventricular small vessel ischemic changes. Vascular: No hyperdense vessel or unexpected calcification. Skull:  Normal. Negative for fracture or focal lesion. Sinuses/Orbits: The visualized paranasal sinuses are essentially clear. The mastoid air cells are unopacified. Other: None. CT CERVICAL SPINE FINDINGS Alignment: Normal cervical lordosis. Skull base and vertebrae: No acute fracture. No primary bone lesion or focal pathologic process. Soft tissues and spinal canal: No prevertebral fluid or swelling. No visible canal hematoma. Disc levels: Mild degenerative changes of the mid/lower cervical spine. Spinal canal is patent. Upper chest: Visualized lung apices are clear. Other: Visualized thyroid is unremarkable. IMPRESSION: No evidence of acute intracranial abnormality. Atrophy with small vessel ischemic changes. No evidence of traumatic injury to the cervical spine. Mild degenerative changes. Electronically Signed   By: Julian Hy M.D.   On: 04/15/2017 18:50   Dg Chest Port 1 View  Result Date: 04/15/2017 CLINICAL DATA:  Fall EXAM: PORTABLE CHEST 1 VIEW COMPARISON:  04/05/2017 FINDINGS: Lungs are clear.  No pleural effusion or pneumothorax. The heart is normal in size. IMPRESSION: No evidence of acute cardiopulmonary disease. Electronically Signed   By: Julian Hy M.D.   On: 04/15/2017 18:47    ASSESSMENT / PLAN:  Acute Diabetic Ketoacidosis-resolved   Acute encephalopathy secondary to DKA vs. CVA  Acute kidney injury related to dehydration secondary to DKA Pseudohyperkalemia-resolved Vaginal Bleeding  Hx: Hypothyroidism, CVA, Depression, Hyperlipidemia  P: Transitioned off insulin gtt Continue scheduled Novolog, SSI, and Lantus Diabetes Coordinator and OBGYN consulted appreciate input  MRI Brain pending will consult neurology if results abnormal  Speech consulted appreciate input Continue Aspirin ,Plavix and Synthroid Continue Atorvastatin Continue Zoloft  Marda Stalker, [place] Pager [phone] (please enter 7 digits) PCCM Consult Pager [phone] (please  enter 7 digits)  PCCM ATTENDING ATTESTATION:  I have evaluated patient with the APP [place], reviewed database in its entirety and discussed care plan in detail. In addition, this patient was discussed on multidisciplinary rounds.   I agree with the above findings assessment and plan   Merton Border, MD PCCM service Mobile [phone] Pager [phone] 04/16/2017 2:49 PM

## 2017-04-16 NOTE — Progress Notes (Signed)
Inpatient Diabetes Program Recommendations  AACE/ADA: New Consensus Statement on Inpatient Glycemic Control (2015)  Target Ranges:  Prepandial:   less than 140 mg/dL      Peak postprandial:   less than 180 mg/dL (1-2 hours)      Critically ill patients:  140 - 180 mg/dL   Lab Results  Component Value Date   GLUCAP 155 (H) 04/16/2017   HGBA1C 7.9 (H) 04/05/2017    Review of Glycemic Control  Results for GERRE, RANUM (MRN [id number]) as of 04/16/2017 09:43  Ref. Range 04/16/2017 04:37 04/16/2017 05:42 04/16/2017 06:45 04/16/2017 07:50 04/16/2017 08:43  Glucose-Capillary Latest Ref Range: 65 - 99 mg/dL 137 (H) 149 (H) 149 (H) 135 (H) 155 (H)    Diabetes history: Type 2 Outpatient Diabetes medications:Novolog 3 units tid, Novolog 0-5 units qhs, Novolog 0-9 units tid, Lantus 15 units qday  Current orders for Inpatient glycemic control: IV insulin   Inpatient Diabetes Program Recommendations:  When the patient is ready to transition off IV insulin, please given Lantus 12 units insulin and continue drip for 2 hours before d/c IV insulin.  Give correction insulin when IV insulin is D/C'd  Consider Lantus 12 units qam, Novolog 0-9 units tid with meals, Novolog 0-5 units qhs and Novolog 2 units tid (hold if patient eats less than 50%)  Gentry Fitz, RN, BA, [place], CDE Diabetes Coordinator Inpatient Diabetes Program  [phone] (Team Pager) [phone] ([place]) 04/16/2017 9:46 AM

## 2017-04-16 NOTE — Patient Outreach (Signed)
[place]) Care Management  04/16/2017  TZIREL LEONOR [birth year] [phone]   Phone call to the [place] to discuss patient's care concerns. Voicemail message left for a return call.  Sheralyn Boatman [facility] Care Management [phone]

## 2017-04-16 NOTE — Progress Notes (Signed)
[place] responded to a consult for pt in [place] who requested for prayer. CH met with pt and granddaughter, but pt did not respond to commands or signs. Granddaughter states that pt was in and out and had not communicated with her. [place] offered spiritual support, silent prayer and pastoral presence.   04/16/17 1400  Clinical Encounter Type  Visited With Patient and family together  Visit Type Initial;Follow-up  Referral From Nurse  Consult/Referral To Chaplain  Spiritual Encounters  Spiritual Needs Prayer;Emotional

## 2017-04-16 NOTE — Progress Notes (Signed)
Patient returned to floor after MRI.

## 2017-04-16 NOTE — Progress Notes (Signed)
Initial Nutrition Assessment  DOCUMENTATION CODES:   Severe malnutrition in context of chronic illness  INTERVENTION:  When diet able to be advanced per SLP, recommend providing Magic cup TID with meals, each supplement provides 290 kcal and 9 grams of protein.  Also recommend daily multivitamin with minerals when diet able to be advanced.  NUTRITION DIAGNOSIS:   Malnutrition (Severe) related to chronic illness (COPD, advanced age) as evidenced by severe depletion of body fat, moderate depletions of muscle mass, severe depletion of muscle mass.  GOAL:   Patient will meet greater than or equal to 90% of their needs  MONITOR:   PO intake, Supplement acceptance, Diet advancement, Labs, Weight trends, Skin, I & O's  REASON FOR ASSESSMENT:   Malnutrition Screening Tool, Low Braden    ASSESSMENT:   83 year old female with PMHx of DM type 2, hx of breast cancer, OSA, osteoporosis, hypothyroidism, HLD, CAD, vitamin D deficiency, esophageal reflux, hx of MI, COPD, IBS, hx of CVA who is admitted s/p fall at [facility] and found to have DKA, acute renal failure, acute encephalopathy.   -Pending SLP consult prior to diet advancement.  Met with patient and her great-granddaughter at bedside. Patient lethargic and unable to provide history at this time, but she is known to this RD from previous admission. Great-granddaughter was not sure how patient had been eating lately, but reports the last time she visited her on Friday she ate pretty well. Patient had previously reported to RD she was not eating well, but that was a previous admission. Patient does not like Ensure, but has previously had [employer].  Spoke with RN who has cared for patient at [facility]. She reports patient is typically on a regular diet with thin liquids.   Patient's UBW was 148 lbs. Patient was 150.2 lbs on 08/20/2016. She has now lost a total of 17.3 lbs (11.5% body weight) over 10 months, which is not  significant for time frame.  Medications reviewed and include: Novolog 0-9 units Q4hrs, Novolog 2 units TID, Lantus 12 units daily, levothyroxine, pantoprazole.  Labs reviewed: CBG 135-177, BUN 46, Creatinine 1.63.  Nutrition-Focused physical exam completed. Findings are severe fat depletion (orbital region, upper arm region, thoracic/lumbar region), moderate-severe muscle depletion (moderate depletion of patellar region, anterior thigh region, posterior calf region; severe depletion of temple region, clavicle region, clavicle/acromion bone region, scapular bone region, dorsal hand), and no edema.   Discussed with RN.  Diet Order:  Diet NPO time specified  Skin:  Reviewed, no issues  Last BM:  Unknown  Height:   Ht Readings from Last 1 Encounters:  04/15/17 _0  (1.676 m)    Weight:   Wt Readings from Last 1 Encounters:  04/15/17 132 lb 15 oz (60.3 kg)    Ideal Body Weight:  59.1 kg  BMI:  Body mass index is 21.46 kg/m.  Estimated Nutritional Needs:   Kcal:  1500-1800 (25-30 kcal/kg)  Protein:  80-90 grams (1.3-1.5 grams/kg)  Fluid:  1.5 L/day (25 ml/kg)  EDUCATION NEEDS:   Education needs no appropriate at this time  Willey Blade, MS, RD, LDN Pager: [phone] After Hours Pager: [phone]

## 2017-04-16 NOTE — Consult Note (Signed)
Consult History and Physical   SERVICE: Gynecology   Patient Name: [name] [name] Patient MRN:   [id number]  CC: Postmenopausal bleeding  HPI: [name] [name] is a 83 y.o. G5P5 with vaginal bleeding noted by nursing staff in the ICU, who was admitted s/post fall at nursing facility several days prior to [place], DKA on insulin drip, acute renal failure, acute encephalopathy secondary to DKA vs. CVA, who has a known history of breast cancer in right breast treated in 2004 with radiation therapy, uterine cancer treated by abdominal hysterectomy, coronary artery disease, diabetes, COPD, hyperlipidemia, history of previous MI, obstructive sleep apnea, dementia, osteoporosis and history of previous CVA. Prior colon resection in 2013 for ischemic colitis at [facility].   She is on plavix, heparin, aspirin while in house. Her gfr is 28%  She is responsive to direct questions, but not comprehensible. I was able to get her consent for a vaginal exam by a direct "OK" but no other words understandable, and she lapsed into inattentiveness immediately after.  No family members present on my exam, but were present earlier in the day at her bedside.   Review of Systems: unable to obtain due to patient mental status   Past Obstetrical History: OB History    Gravida Para Term Preterm AB Living   5 5       5    SAB TAB Ectopic Multiple Live Births                  Obstetric Comments   1st Menstrual Cycle:  1st Pregnancy:  47      Past Gynecologic History: No LMP recorded. Patient has had a hysterectomy.   Past Medical History: Past Medical History:  Diagnosis Date  . Anxiety state, unspecified   . CAD (coronary artery disease)   . Cancer [facility])    2004 Right breast, found on mammogram, radiation therapy, Dr. Bryson Ha, uterine cancer,   . COPD (chronic obstructive pulmonary disease) ([place])   . Diabetes mellitus   . Esophageal reflux   . Heart burn   . Hyperlipidemia   . IBS (irritable bowel  syndrome)   . Myocardial infarction [facility])    Cath negative except for 40% occlusion LAD.  Pt not candidate for betablocker or ACEI because of hypotension  . OSA (obstructive sleep apnea)   . Osteoporosis 02/21/09   DEXA scan showed osteoporosis with left femur T-score -2.8.  Marland Kitchen Presbyacusis   . Stroke ([place])   . Thyroid disease    Hypothyroid  . Vitamin D deficiency     Past Surgical History:   Past Surgical History:  Procedure Laterality Date  . ABDOMINAL HYSTERECTOMY     uterine cancer  . BREAST SURGERY    . COLON SURGERY  2013   done at [facility]    Family History:  family history includes Diabetes in her mother; Heart attack in her father.  Social History:  Social History   Social History  . Marital status: Single    Spouse name: N/A  . Number of children: N/A  . Years of education: N/A   Occupational History  . retired    Social History Main Topics  . Smoking status: Never Smoker  . Smokeless tobacco: Never Used  . Alcohol use No  . Drug use: No  . Sexual activity: Not on file   Other Topics Concern  . Not on file   Social History Narrative  . No narrative on file    Home Medications:  Medications reconciled in EPIC  No current facility-administered medications on file prior to encounter.    Current Outpatient Prescriptions on File Prior to Encounter  Medication Sig Dispense Refill  . aspirin EC 81 MG tablet Take 1 tablet (81 mg total) by mouth daily. 90 tablet 3  . atorvastatin (LIPITOR) 80 MG tablet Take 1 tablet (80 mg total) by mouth daily. 90 tablet 3  . clopidogrel (PLAVIX) 75 MG tablet TAKE 1 TABLET(75 MG) BY MOUTH DAILY 90 tablet 3  . fluticasone (FLONASE) 50 MCG/ACT nasal spray Place 2 sprays into both nostrils daily. (Patient taking differently: Place 2 sprays into both nostrils daily as needed. ) 16 g 6  . glucagon 1 MG injection Inject 1 mg into the vein once as needed.    . insulin aspart (NOVOLOG) 100 UNIT/ML injection Inject 3 Units into the  skin 3 (three) times daily with meals. 10 mL 0  . insulin aspart (NOVOLOG) 100 UNIT/ML injection Inject 0-5 Units into the skin at bedtime. 10 mL 0  . insulin aspart (NOVOLOG) 100 UNIT/ML injection Inject 0-9 Units into the skin 3 (three) times daily with meals. 10 mL 0  . insulin glargine (LANTUS) 100 UNIT/ML injection Inject 0.15 mLs (15 Units total) into the skin daily. 10 mL 0  . levothyroxine (SYNTHROID, LEVOTHROID) 100 MCG tablet TAKE 1 TABLET(100 MCG) BY MOUTH DAILY 90 tablet 3  . loperamide (IMODIUM) 2 MG capsule Take 4 mg by mouth as needed for diarrhea or loose stools.    . metFORMIN (GLUCOPHAGE) 500 MG tablet TAKE 1 TABLET(500 MG) BY MOUTH TWICE DAILY WITH A MEAL 180 tablet 1  . midodrine (PROAMATINE) 10 MG tablet Take 1 tablet (10 mg total) by mouth 3 (three) times daily with meals. 90 tablet 1  . pantoprazole (PROTONIX) 40 MG tablet Take 1 tablet (40 mg total) by mouth 2 (two) times daily. 180 tablet 3  . sertraline (ZOLOFT) 25 MG tablet Take 1 tablet (25 mg total) by mouth daily after breakfast. 30 tablet 1    Allergies:  Allergies  Allergen Reactions  . Penicillins Rash    Has patient had a PCN reaction causing immediate rash, facial/tongue/throat swelling, SOB or lightheadedness with hypotension: No Has patient had a PCN reaction causing severe rash involving mucus membranes or skin necrosis: No Has patient had a PCN reaction that required hospitalization No Has patient had a PCN reaction occurring within the last 10 years: No If all of the above answers are "NO", then may proceed with Cephalosporin use.     Physical Exam:  Temp:  [98.1 F (36.7 C)-99.5 F (37.5 C)] 98.2 F (36.8 C) (09/20 1600) Pulse Rate:  [77-110] 77 (09/20 1200) Resp:  [13-18] 14 (09/20 1200) BP: (114-157)/(52-92) 131/78 (09/20 1200) SpO2:  [95 %-100 %] 98 % (09/20 1200) Weight:  [132 lb 15 oz (60.3 kg)] 132 lb 15 oz (60.3 kg) (09/19 2050)   General Appearance:  Poorly nourished, no acute  distress, neither alert nor oriented  Abdomen:  Bowel sounds present, soft, nontender, nondistended, no abnormal masses, no epigastric pain Extremities:  Full range of motion, no pedal edema, 2+ distal pulses, no tenderness Skin:  normal coloration and turgor, no rashes, no suspicious skin lesions noted  Neurologic:  Cranial nerves 2-12 grossly intact, hypertonic in all four extremities, difficulty with separating LE for vaginal exam Pelvic:  NEFG, no vulvar masses or lesions, normal vaginal mucosa, very light pink on my gloves, no foreign bodies noted in vaginal canal, no  adnexal masses appreciated,  no pelvic organ prolapse.    Labs/Studies:   CBC and Coags:  Lab Results  Component Value Date   WBC 9.5 04/16/2017   NEUTOPHILPCT 88 04/15/2017   EOSPCT 0 04/15/2017   BASOPCT 0 04/15/2017   LYMPHOPCT 7 04/15/2017   HGB 11.0 (L) 04/16/2017   HCT 31.6 (L) 04/16/2017   MCV 92.4 04/16/2017   PLT 346 04/16/2017   INR 1.15 11/06/2016   CMP:  Lab Results  Component Value Date   NA 144 04/16/2017   K 4.0 04/16/2017   CL 110 04/16/2017   CO2 24 04/16/2017   BUN 46 (H) 04/16/2017   CREATININE 1.63 (H) 04/16/2017   CREATININE 1.73 (H) 04/16/2017   CREATININE 1.90 (H) 04/16/2017   PROT 8.3 (H) 04/15/2017   BILITOT 2.1 (H) 04/15/2017   BILIDIR 0.3 04/15/2017   ALT 22 04/15/2017   AST 24 04/15/2017   ALKPHOS 115 04/15/2017    Other Imaging: Ct Head Wo Contrast  Result Date: 04/15/2017 CLINICAL DATA:  Fall EXAM: CT HEAD WITHOUT CONTRAST CT CERVICAL SPINE WITHOUT CONTRAST TECHNIQUE: Multidetector CT imaging of the head and cervical spine was performed following the standard protocol without intravenous contrast. Multiplanar CT image reconstructions of the cervical spine were also generated. COMPARISON:  CT head dated 04/05/2017 FINDINGS: CT HEAD FINDINGS Brain: No evidence of acute infarction, hemorrhage, extra-axial collection or mass lesion/mass effect. Global cortical and central  atrophy. Secondary ventricular prominence. Extensive subcortical white matter and periventricular small vessel ischemic changes. Vascular: No hyperdense vessel or unexpected calcification. Skull: Normal. Negative for fracture or focal lesion. Sinuses/Orbits: The visualized paranasal sinuses are essentially clear. The mastoid air cells are unopacified. Other: None. CT CERVICAL SPINE FINDINGS Alignment: Normal cervical lordosis. Skull base and vertebrae: No acute fracture. No primary bone lesion or focal pathologic process. Soft tissues and spinal canal: No prevertebral fluid or swelling. No visible canal hematoma. Disc levels: Mild degenerative changes of the mid/lower cervical spine. Spinal canal is patent. Upper chest: Visualized lung apices are clear. Other: Visualized thyroid is unremarkable. IMPRESSION: No evidence of acute intracranial abnormality. Atrophy with small vessel ischemic changes. No evidence of traumatic injury to the cervical spine. Mild degenerative changes. Electronically Signed   By: Julian Hy M.D.   On: 04/15/2017 18:50   Ct Head Wo Contrast  Result Date: 04/05/2017 CLINICAL DATA:  Altered mental status, nausea, and vomiting. EXAM: CT HEAD WITHOUT CONTRAST TECHNIQUE: Contiguous axial images were obtained from the base of the skull through the vertex without intravenous contrast. COMPARISON:  02/12/2017 FINDINGS: Brain: Small chronic infarcts are again seen in the cerebellum and left basal ganglia. Extensive confluent hypoattenuation throughout the cerebral white matter bilaterally is unchanged. Ventricular enlargement is unchanged and may reflect advanced central predominant cerebral atrophy although normal pressure hydrocephalus is not excluded in the appropriate clinical setting. No acute large territory infarct, intracranial hemorrhage, mass, midline shift, or extra-axial fluid collection is identified. Vascular: No hyperdense vessel. Skull: No fracture focal osseous lesion.  Sinuses/Orbits: Visualized paranasal sinuses and mastoid air cells are clear. Bilateral cataract extraction. Other: None. IMPRESSION: 1. No evidence of acute intracranial abnormality. 2. Extensive chronic small vessel ischemic disease. 3. Unchanged ventricular enlargement which may reflect cerebral atrophy versus normal pressure hydrocephalus. Electronically Signed   By: Logan Bores M.D.   On: 04/05/2017 17:00   Ct Cervical Spine Wo Contrast  Result Date: 04/15/2017 CLINICAL DATA:  Fall EXAM: CT HEAD WITHOUT CONTRAST CT CERVICAL SPINE WITHOUT CONTRAST TECHNIQUE: Multidetector  CT imaging of the head and cervical spine was performed following the standard protocol without intravenous contrast. Multiplanar CT image reconstructions of the cervical spine were also generated. COMPARISON:  CT head dated 04/05/2017 FINDINGS: CT HEAD FINDINGS Brain: No evidence of acute infarction, hemorrhage, extra-axial collection or mass lesion/mass effect. Global cortical and central atrophy. Secondary ventricular prominence. Extensive subcortical white matter and periventricular small vessel ischemic changes. Vascular: No hyperdense vessel or unexpected calcification. Skull: Normal. Negative for fracture or focal lesion. Sinuses/Orbits: The visualized paranasal sinuses are essentially clear. The mastoid air cells are unopacified. Other: None. CT CERVICAL SPINE FINDINGS Alignment: Normal cervical lordosis. Skull base and vertebrae: No acute fracture. No primary bone lesion or focal pathologic process. Soft tissues and spinal canal: No prevertebral fluid or swelling. No visible canal hematoma. Disc levels: Mild degenerative changes of the mid/lower cervical spine. Spinal canal is patent. Upper chest: Visualized lung apices are clear. Other: Visualized thyroid is unremarkable. IMPRESSION: No evidence of acute intracranial abnormality. Atrophy with small vessel ischemic changes. No evidence of traumatic injury to the cervical spine.  Mild degenerative changes. Electronically Signed   By: Julian Hy M.D.   On: 04/15/2017 18:50   Dg Chest Port 1 View  Result Date: 04/15/2017 CLINICAL DATA:  Fall EXAM: PORTABLE CHEST 1 VIEW COMPARISON:  04/05/2017 FINDINGS: Lungs are clear.  No pleural effusion or pneumothorax. The heart is normal in size. IMPRESSION: No evidence of acute cardiopulmonary disease. Electronically Signed   By: Julian Hy M.D.   On: 04/15/2017 18:47   Dg Chest Portable 1 View  Result Date: 04/05/2017 CLINICAL DATA:  Altered mental status. EXAM: PORTABLE CHEST 1 VIEW COMPARISON:  Radiograph February 12, 2017. FINDINGS: The heart size and mediastinal contours are within normal limits. Atherosclerosis of thoracic aorta is noted. No pneumothorax or pleural effusion is noted. Both lungs are clear. The visualized skeletal structures are unremarkable. IMPRESSION: Aortic atherosclerosis.  No acute cardiopulmonary abnormality seen. Electronically Signed   By: Marijo Conception, M.D.   On: 04/05/2017 09:07     Assessment / Plan:   Haidynn C Even is a 83 y.o. G5P5 consulted for light vaginal bleeding, with a hx of hysterectomy for uterine cancer, on anticoagulation.  1. DDx includes atrophic tissue on anticoagulation, endometrial cancer mets to vagina. On my exam today, small vaginal bleeding noted. Will get abdominal ultrasound to evaluate pelvis, but will defer CT pelvis with contrast due to kidney failure. If acute issues resolved, will recommend complete pelvic exam in gyn bed to fully evaluate cervical cuff. Do not recommend vaginal estrogen with hx of CA. Will f/u ultrasound results.  Thank you for the opportunity to be involved with this pt's care.

## 2017-04-17 ENCOUNTER — Inpatient Hospital Stay: Payer: Medicare Other

## 2017-04-17 LAB — GLUCOSE, CAPILLARY
GLUCOSE-CAPILLARY: 110 mg/dL — AB (ref 65–99)
GLUCOSE-CAPILLARY: 215 mg/dL — AB (ref 65–99)
Glucose-Capillary: 183 mg/dL — ABNORMAL HIGH (ref 65–99)
Glucose-Capillary: 283 mg/dL — ABNORMAL HIGH (ref 65–99)
Glucose-Capillary: 125 mg/dL — ABNORMAL HIGH (ref 65–99)

## 2017-04-17 LAB — CBC
HCT: 32.4 % — ABNORMAL LOW (ref 35.0–47.0)
Hemoglobin: 11.1 g/dL — ABNORMAL LOW (ref 12.0–16.0)
MCH: 32.4 pg (ref 26.0–34.0)
MCHC: 34.3 g/dL (ref 32.0–36.0)
MCV: 94.6 fL (ref 80.0–100.0)
PLATELETS: 274 10*3/uL (ref 150–440)
RBC: 3.43 MIL/uL — AB (ref 3.80–5.20)
RDW: 16.1 % — ABNORMAL HIGH (ref 11.5–14.5)
WBC: 6.4 10*3/uL (ref 3.6–11.0)

## 2017-04-17 LAB — BASIC METABOLIC PANEL
ANION GAP: 11 (ref 5–15)
BUN: 33 mg/dL — AB (ref 6–20)
CHLORIDE: 110 mmol/L (ref 101–111)
CO2: 26 mmol/L (ref 22–32)
Calcium: 9.5 mg/dL (ref 8.9–10.3)
Creatinine, Ser: 1.17 mg/dL — ABNORMAL HIGH (ref 0.44–1.00)
GFR, EST AFRICAN AMERICAN: 49 mL/min — AB (ref >60)
GFR, EST NON AFRICAN AMERICAN: 42 mL/min — AB (ref >60)
Glucose, Bld: 215 mg/dL — ABNORMAL HIGH (ref 65–99)
POTASSIUM: 3.6 mmol/L (ref 3.5–5.1)
Sodium: 147 mmol/L — ABNORMAL HIGH (ref 135–145)

## 2017-04-17 MED ORDER — ADULT MULTIVITAMIN W/MINERALS CH
1.0000 | ORAL_TABLET | Freq: Every day | ORAL | Status: DC
  Administered 2017-04-18 – 2017-04-21 (×4): 1 via ORAL
  Filled 2017-04-17 (×4): qty 1

## 2017-04-17 MED ORDER — INSULIN ASPART 100 UNIT/ML ~~LOC~~ SOLN
0.0000 [IU] | Freq: Four times a day (QID) | SUBCUTANEOUS | Status: DC
  Administered 2017-04-17: 5 [IU] via SUBCUTANEOUS
  Administered 2017-04-17: 1 [IU] via SUBCUTANEOUS
  Administered 2017-04-18 (×2): 3 [IU] via SUBCUTANEOUS
  Filled 2017-04-17 (×3): qty 1

## 2017-04-17 NOTE — Progress Notes (Signed)
Patient is awake, nodding and answering questions appropriately.  Purewick in place. Abdominal ultrasound and MRI done.  No acute distress noted.

## 2017-04-17 NOTE — Evaluation (Signed)
Clinical/Bedside Swallow Evaluation Patient Details  Name: [name] [name] MRN: [id number] Date of Birth: 1932-06-28  Today's Date: 04/17/2017 Time: SLP Start Time (ACUTE ONLY): 1255 SLP Stop Time (ACUTE ONLY): 1355 SLP Time Calculation (min) (ACUTE ONLY): 60 min  Past Medical History:  Past Medical History:  Diagnosis Date  . Anxiety state, unspecified   . CAD (coronary artery disease)   . Cancer [facility])    2004 Right breast, found on mammogram, radiation therapy, Dr. Bryson Ha, uterine cancer,   . COPD (chronic obstructive pulmonary disease) ([place])   . Diabetes mellitus   . Esophageal reflux   . Heart burn   . Hyperlipidemia   . IBS (irritable bowel syndrome)   . Myocardial infarction [facility])    Cath negative except for 40% occlusion LAD.  Pt not candidate for betablocker or ACEI because of hypotension  . OSA (obstructive sleep apnea)   . Osteoporosis 02/21/09   DEXA scan showed osteoporosis with left femur T-score -2.8.  Marland Kitchen Presbyacusis   . Stroke ([place])   . Thyroid disease    Hypothyroid  . Vitamin D deficiency    Past Surgical History:  Past Surgical History:  Procedure Laterality Date  . ABDOMINAL HYSTERECTOMY     uterine cancer  . BREAST SURGERY    . COLON SURGERY  2013   done at [facility]   HPI:  Pt is a 83 y.o. female with multiple medical issues/history of Coronary artery disease, diabetes, COPD, history of breast cancer, hyperlipidemia, history of previous MI, obstructive sleep apnea, Dementia, Esophageal Reflux, IBS, anxiety, osteoporosis and history of previous CVA who presents to the hospital due to altered mental status. Patient herself is a very poor historian therefore most of the history obtained from the chart. Patient apparently had a fall 3 days ago and was noted to have significant bruising on her head but no lacerations or any further trauma and therefore was sent to the ER for further evaluations. Patient in the emergency room was noted to be significantly  hyperglycemic and noted to be in acute diabetic ketoacidosis with acute kidney injury and therefore hospitalist services were consulted for treatment evaluation. Pt is awake, perseveration noted in behaviors. Min. verbal but answered few Y/N questions.    Assessment / Plan / Recommendation Clinical Impression  Pt appears to present w/ oropharyngeal phase dysphagia most likely impacted by declined Cognitive status and current illness. Pt exhibited slower, deliberate behavior during all tasks w/ some perseveration. She was easily distracted. Oral phase appeared grossly wfl w/ bolus management w/ no overt weakness noted; slower bolus mastication and clearing w/ increased textured trials but given time cleared adequately. During trials of thin liquids, delayed overt s/s of aspiration noted(mild coughing); no decline in O2 sats or RR. When given trials of Nectar liquids via cup/straw, no overt s/s of aspiration noted; pt consumed ~3-4 ozs w/ no incident. Due to pt's decline status at this time and understood impact of Cognition on swallowing awareness and safety, recommend a Dysphagia level 2 w/ Nectar liquids diet w/ aspiration precautions; Pills in Puree. ST services will f/u w/ ongoing assessment for safe diet upgrade, objective assessment as needed. Recommend monitoring during oral intake/meals by [facility] staff and assistance as needed. NSG updated.  SLP Visit Diagnosis: Dysphagia, oropharyngeal phase (R13.12)    Aspiration Risk  Mild aspiration risk    Diet Recommendation  Dysphagia level 2 (minced) w/ Nectar liquids; aspiration precautions; monitoring and support at meals. REFLUX precautions d/t baseline Reflux.  Medication Administration:  Crushed with puree    Other  Recommendations Recommended Consults:  (Dietician f/u) Oral Care Recommendations: Oral care BID;Staff/trained caregiver to provide oral care Other Recommendations: Order thickener from pharmacy;Prohibited food (jello, ice cream, thin  soups);Remove water pitcher;Have oral suction available   Follow up Recommendations Skilled Nursing facility      Frequency and Duration min 3x week  2 weeks       Prognosis Prognosis for Safe Diet Advancement: Fair Barriers to Reach Goals: Cognitive deficits      Swallow Study   General Date of Onset: 04/15/17 HPI: Pt is a 83 y.o. female with multiple medical issues/history of Coronary artery disease, diabetes, COPD, history of breast cancer, hyperlipidemia, history of previous MI, obstructive sleep apnea, Dementia, Esophageal Reflux, IBS, anxiety, osteoporosis and history of previous CVA who presents to the hospital due to altered mental status. Patient herself is a very poor historian therefore most of the history obtained from the chart. Patient apparently had a fall 3 days ago and was noted to have significant bruising on her head but no lacerations or any further trauma and therefore was sent to the ER for further evaluations. Patient in the emergency room was noted to be significantly hyperglycemic and noted to be in acute diabetic ketoacidosis with acute kidney injury and therefore hospitalist services were consulted for treatment evaluation. Pt is awake, perseveration noted in behaviors. Min. verbal but answered few Y/N questions.  Type of Study: Bedside Swallow Evaluation Previous Swallow Assessment: none indicated Diet Prior to this Study: NPO (since admission) Temperature Spikes Noted: No (wbc not elevated) Respiratory Status: Room air History of Recent Intubation: No Behavior/Cognition: Cooperative;Confused;Distractible;Requires cueing (awake) Oral Cavity Assessment: Dry;Dried secretions Oral Care Completed by SLP: Recent completion by staff Oral Cavity - Dentition: Adequate natural dentition Vision: Functional for self-feeding Self-Feeding Abilities: Able to feed self;Needs assist;Needs set up;Total assist Patient Positioning: Upright in bed Baseline Vocal Quality: Low  vocal intensity Volitional Cough: Cognitively unable to elicit Volitional Swallow: Able to elicit    Oral/Motor/Sensory Function Overall Oral Motor/Sensory Function: Within functional limits (w/ bolus management)   Ice Chips Ice chips: Within functional limits Presentation: Spoon (fed; 3 trials)   Thin Liquid Thin Liquid: Impaired Presentation: Self Fed;Cup (assisted; 3 trials) Oral Phase Impairments:  (none) Oral Phase Functional Implications:  (none) Pharyngeal  Phase Impairments: Cough - Delayed (x2/3 trials)    Nectar Thick Nectar Thick Liquid: Within functional limits Presentation: Cup;Self Fed;Straw (assisted; ~3 ozs) Other Comments: perseveration   Honey Thick Honey Thick Liquid: Not tested   Puree Puree: Within functional limits Presentation: Self Fed;Spoon (8-9 trials)   Solid   GO   Solid: Impaired (mech soft trials well broken down ) Presentation: Self Fed;Spoon (assisted; 4-5 trials) Oral Phase Impairments: Impaired mastication (slow) Oral Phase Functional Implications: Prolonged oral transit;Impaired mastication Pharyngeal Phase Impairments:  (none)    Functional Assessment Tool Used: clinical judgement Functional Limitations: Swallowing Swallow Current Status (U1324): At least 20 percent but less than 40 percent impaired, limited or restricted Swallow Goal Status [phone]): At least 20 percent but less than 40 percent impaired, limited or restricted Swallow Discharge Status [phone]): At least 20 percent but less than 40 percent impaired, limited or restricted    Orinda Kenner, MS, CCC-SLP Watson,[name] 04/17/2017,4:56 PM

## 2017-04-17 NOTE — Progress Notes (Signed)
[place] [facility] [place] NAME: [name] [name]    MR#:  [id number]  DATE OF BIRTH:  09/14/1931  SUBJECTIVE:   Patient seen in ultrasound  Doing well this am her MS is better  REVIEW OF SYSTEMS:    She has some dementia  Tolerating Diet:npo      DRUG ALLERGIES:   Allergies  Allergen Reactions  . Penicillins Rash    Has patient had a PCN reaction causing immediate rash, facial/tongue/throat swelling, SOB or lightheadedness with hypotension: No Has patient had a PCN reaction causing severe rash involving mucus membranes or skin necrosis: No Has patient had a PCN reaction that required hospitalization No Has patient had a PCN reaction occurring within the last 10 years: No If all of the above answers are "NO", then may proceed with Cephalosporin use.     VITALS:  Blood pressure (!) 129/101, pulse 96, temperature 98.2 F (36.8 C), temperature source Oral, resp. rate 15, height 5\' 6"  (1.676 m), weight 60.3 kg (132 lb 15 oz), SpO2 95 %.  PHYSICAL EXAMINATION:  Constitutional: Appears well-developed and well-nourished. No distress. HENT: Normocephalic. Marland Kitchen Oropharynx is clear and moist.  Eyes: Conjunctivae and EOM are normal. PERRLA, no scleral icterus.  Neck: Normal ROM. Neck supple. No JVD. No tracheal deviation. CVS: RRR, S1/S2 +, no murmurs, no gallops, no carotid bruit.  Pulmonary: Effort and breath sounds normal, no stridor, rhonchi, wheezes, rales.  Abdominal: Soft. BS +,  no distension, tenderness, rebound or guarding.  Musculoskeletal: Normal range of motion. No edema and no tenderness.  Neuro: oriented to  Name  Skin: Skin is warm and dry. No rash noted. Psychiatric: flat affect    LABORATORY PANEL:   CBC  Recent Labs Lab 04/17/17 0425  WBC 6.4  HGB 11.1*  HCT 32.4*  PLT 274   ------------------------------------------------------------------------------------------------------------------  Chemistries   Recent  Labs Lab 04/15/17 1748  04/17/17 0425  NA 137  < > 147*  K 5.5*  < > 3.6  CL 99*  < > 110  CO2 15*  < > 26  GLUCOSE 720*  < > 215*  BUN 43*  < > 33*  CREATININE 2.01*  < > 1.17*  CALCIUM 9.7  < > 9.5  AST 24  --   --   ALT 22  --   --   ALKPHOS 115  --   --   BILITOT 2.1*  --   --   < > = values in this interval not displayed. ------------------------------------------------------------------------------------------------------------------  Cardiac Enzymes  Recent Labs Lab 04/15/17 1748  TROPONINI <0.03   ------------------------------------------------------------------------------------------------------------------  RADIOLOGY:  Ct Head Wo Contrast  Result Date: 04/15/2017 CLINICAL DATA:  Fall EXAM: CT HEAD WITHOUT CONTRAST CT CERVICAL SPINE WITHOUT CONTRAST TECHNIQUE: Multidetector CT imaging of the head and cervical spine was performed following the standard protocol without intravenous contrast. Multiplanar CT image reconstructions of the cervical spine were also generated. COMPARISON:  CT head dated 04/05/2017 FINDINGS: CT HEAD FINDINGS Brain: No evidence of acute infarction, hemorrhage, extra-axial collection or mass lesion/mass effect. Global cortical and central atrophy. Secondary ventricular prominence. Extensive subcortical white matter and periventricular small vessel ischemic changes. Vascular: No hyperdense vessel or unexpected calcification. Skull: Normal. Negative for fracture or focal lesion. Sinuses/Orbits: The visualized paranasal sinuses are essentially clear. The mastoid air cells are unopacified. Other: None. CT CERVICAL SPINE FINDINGS Alignment: Normal cervical lordosis. Skull base and vertebrae: No acute fracture. No primary bone lesion or focal pathologic  process. Soft tissues and spinal canal: No prevertebral fluid or swelling. No visible canal hematoma. Disc levels: Mild degenerative changes of the mid/lower cervical spine. Spinal canal is patent. Upper  chest: Visualized lung apices are clear. Other: Visualized thyroid is unremarkable. IMPRESSION: No evidence of acute intracranial abnormality. Atrophy with small vessel ischemic changes. No evidence of traumatic injury to the cervical spine. Mild degenerative changes. Electronically Signed   By: Julian Hy M.D.   On: 04/15/2017 18:50   Ct Cervical Spine Wo Contrast  Result Date: 04/15/2017 CLINICAL DATA:  Fall EXAM: CT HEAD WITHOUT CONTRAST CT CERVICAL SPINE WITHOUT CONTRAST TECHNIQUE: Multidetector CT imaging of the head and cervical spine was performed following the standard protocol without intravenous contrast. Multiplanar CT image reconstructions of the cervical spine were also generated. COMPARISON:  CT head dated 04/05/2017 FINDINGS: CT HEAD FINDINGS Brain: No evidence of acute infarction, hemorrhage, extra-axial collection or mass lesion/mass effect. Global cortical and central atrophy. Secondary ventricular prominence. Extensive subcortical white matter and periventricular small vessel ischemic changes. Vascular: No hyperdense vessel or unexpected calcification. Skull: Normal. Negative for fracture or focal lesion. Sinuses/Orbits: The visualized paranasal sinuses are essentially clear. The mastoid air cells are unopacified. Other: None. CT CERVICAL SPINE FINDINGS Alignment: Normal cervical lordosis. Skull base and vertebrae: No acute fracture. No primary bone lesion or focal pathologic process. Soft tissues and spinal canal: No prevertebral fluid or swelling. No visible canal hematoma. Disc levels: Mild degenerative changes of the mid/lower cervical spine. Spinal canal is patent. Upper chest: Visualized lung apices are clear. Other: Visualized thyroid is unremarkable. IMPRESSION: No evidence of acute intracranial abnormality. Atrophy with small vessel ischemic changes. No evidence of traumatic injury to the cervical spine. Mild degenerative changes. Electronically Signed   By: Julian Hy  M.D.   On: 04/15/2017 18:50   Mr Brain Wo Contrast  Result Date: 04/16/2017 CLINICAL DATA:  Altered mental status EXAM: MRI HEAD WITHOUT CONTRAST TECHNIQUE: Multiplanar, multiecho pulse sequences of the brain and surrounding structures were obtained without intravenous contrast. COMPARISON:  Head CT 04/15/2017 FINDINGS: Brain: The midline structures are normal. There is no focal diffusion restriction to indicate acute infarct. There is diffuse confluent hyperintense T2-weighted signal within the periventricular, deep and juxtacortical white matter, most often seen in the setting of chronic microvascular ischemia. No intraparenchymal hematoma or chronic microhemorrhage. Advanced volume loss without lobar predilection. The dura is normal and there is no extra-axial collection. Vascular: Major intracranial arterial and venous sinus flow voids are preserved. Skull and upper cervical spine: The visualized skull base, calvarium, upper cervical spine and extracranial soft tissues are normal. Sinuses/Orbits: No fluid levels or advanced mucosal thickening. No mastoid or middle ear effusion. Normal orbits. IMPRESSION: 1. No acute intracranial abnormality. 2. Advanced atrophy and chronic ischemic microangiopathy. Electronically Signed   By: Ulyses Jarred M.D.   On: 04/16/2017 22:34   Dg Chest Port 1 View  Result Date: 04/15/2017 CLINICAL DATA:  Fall EXAM: PORTABLE CHEST 1 VIEW COMPARISON:  04/05/2017 FINDINGS: Lungs are clear.  No pleural effusion or pneumothorax. The heart is normal in size. IMPRESSION: No evidence of acute cardiopulmonary disease. Electronically Signed   By: Julian Hy M.D.   On: 04/15/2017 18:47     ASSESSMENT AND PLAN:   83 year old female with a history of diabetes, COPD, CVA and mild dementia who presents from skilled nursing facility with altered mental status.   1. Acute metabolic encephalopathy in the setting of diabetic ketoacidosis and possible CVA DKA has resolved  Patient's mental status is better Discussed case with the intensivist MRI did not show CVA  2. DKA: This has resolved Transition to subcutaneous insulin as requested by diabetes coordinator.  3. Acute kidney injury in the setting of dehydration and DKA: Creatinine has improved.  4. Vaginal bleeding as per nursing report: OB/GYN consultation appreciated. Follow up ultrasound results She can follow up with OB/GYN as an outpatient for full evaluation of cervical cuff.  5. Hypokalemia: This is been repleted  6. Hypothyroidism: Continue Synthroid  7. History of orthostatic hypotension: Continue Midodrine  8. History of previous CVA: Continue aspirin, Plavix and statin  9. Hyperlipidemia: Continue statin  10. Depression: Continue Zoloft  11. Mild dementia as per granddaughter Continue to monitor  Management plans discussed with the patient she is in agreement. D/w intensivist  CODE STATUS: FULL  TOTAL TIME TAKING CARE OF THIS PATIENT: 24 minutes.   PT/speech consult   POSSIBLE D/C tomorrow back to SNF, DEPENDING ON CLINICAL CONDITION.   Avonte Sensabaugh M.D on 04/17/2017 at 11:20 AM  Between 7am to 6pm - Pager - [phone] After 6pm go to [email or web address] - password [email or web address] [place] [facility]  Office  [phone]  CC: Primary care physician; [name], DO  Note: This dictation was prepared with Dragon dictation along with smaller phrase technology. Any transcriptional errors that result from this process are unintentional.

## 2017-04-17 NOTE — NC FL2 (Signed)
[place] LEVEL OF CARE SCREENING TOOL     IDENTIFICATION  Patient Name: [name] [name] Birthdate: 1932-05-31 Sex: female Admission Date (Current Location): 04/15/2017  [place] and [place] Number:  Engineering geologist and Address:  [facility], [address], [place], [place] [address]      Provider Number: [id number]  Attending Physician Name and Address:  Bettey Costa, MD  Relative Name and Phone Number:       Current Level of Care: Hospital Recommended Level of Care: [place] Prior Approval Number:    Date Approved/Denied:   PASRR Number:    Discharge Plan: SNF    Current Diagnoses: Patient Active Problem List   Diagnosis Date Noted  . Hyperglycemia   . Acute kidney injury ([place])   . Metabolic encephalopathy 44/07/270  . Adjustment disorder with mixed anxiety and depressed mood   . Coffee ground emesis 02/12/2017  . Acute on chronic renal failure ([place]) 01/13/2017  . Dementia, vascular 12/03/2016  . Stroke ([place]) 08/20/2016  . Peripheral vascular disease ([place]) 04/09/2016  . DKA (diabetic ketoacidoses) ([place]) 06/08/2015  . CKD stage 3 due to type 2 diabetes mellitus ([place]) 02/22/2014  . Hypothyroidism 09/23/2012  . GERD (gastroesophageal reflux disease) 09/23/2012  . Anxiety 07/26/2012  . Hyperlipidemia with target LDL less than 70 12/03/2011  . DM (diabetes mellitus) type II uncontrolled, periph vascular disorder ([place]) 04/03/2011  . CAD (coronary artery disease) 12/13/2009    Orientation RESPIRATION BLADDER Height & Weight     Self, Place  Normal Incontinent Weight: 132 lb 15 oz (60.3 kg) Height:  5\' 6"  (167.6 cm)  BEHAVIORAL SYMPTOMS/MOOD NEUROLOGICAL BOWEL NUTRITION STATUS   (none)  (none) Continent Diet (dysphagia 2)  AMBULATORY STATUS COMMUNICATION OF NEEDS Skin   Extensive Assist Verbally Normal                       Personal Care Assistance Level of Assistance  Bathing, Dressing Bathing  Assistance: Maximum assistance   Dressing Assistance: Maximum assistance     Functional Limitations Info  Sight, Hearing Sight Info: Impaired Hearing Info: Impaired      SPECIAL CARE FACTORS FREQUENCY  PT (By licensed PT)                    Contractures Contractures Info: Not present    Additional Factors Info  Code Status Code Status Info: full             Current Medications (04/17/2017):  This is the current hospital active medication list Current Facility-Administered Medications  Medication Dose Route Frequency Provider Last Rate Last Dose  . acetaminophen (TYLENOL) tablet 650 mg  650 mg Oral Q6H PRN Henreitta Leber, MD       Or  . acetaminophen (TYLENOL) suppository 650 mg  650 mg Rectal Q6H PRN Henreitta Leber, MD      . aspirin EC tablet 81 mg  81 mg Oral Daily Sainani, Belia Heman, MD      . atorvastatin (LIPITOR) tablet 80 mg  80 mg Oral Daily Sainani, Belia Heman, MD      . chlorhexidine (PERIDEX) 0.12 % solution 15 mL  15 mL Mouth Rinse BID Varughese, Bincy S, NP   15 mL at 04/17/17 1030  . clopidogrel (PLAVIX) tablet 75 mg  75 mg Oral Daily Sainani, Belia Heman, MD      . heparin injection 5,000 Units  5,000 Units Subcutaneous Q8H Sainani,  Belia Heman, MD   5,000 Units at 04/17/17 1526  . insulin aspart (novoLOG) injection 0-9 Units  0-9 Units Subcutaneous Q6H Awilda Bill, NP   5 Units at 04/17/17 1234  . insulin glargine (LANTUS) injection 12 Units  12 Units Subcutaneous Daily Awilda Bill, NP   12 Units at 04/17/17 1030  . levothyroxine (SYNTHROID, LEVOTHROID) tablet 100 mcg  100 mcg Oral QAC breakfast Henreitta Leber, MD      . MEDLINE mouth rinse  15 mL Mouth Rinse q12n4p Varughese, Bincy S, NP   15 mL at 04/17/17 1200  . midodrine (PROAMATINE) tablet 10 mg  10 mg Oral TID WC Sainani, Vivek J, MD      . morphine 2 MG/ML injection 0.5-1 mg  0.5-1 mg Intravenous Q4H PRN Awilda Bill, NP      . Derrill Memo ON 04/18/2017] multivitamin with minerals tablet 1  tablet  1 tablet Oral Daily Mody, Sital, MD      . ondansetron (ZOFRAN) injection 4 mg  4 mg Intravenous Q6H PRN Henreitta Leber, MD      . pantoprazole (PROTONIX) EC tablet 40 mg  40 mg Oral BID Henreitta Leber, MD   40 mg at 04/16/17 2130  . sertraline (ZOLOFT) tablet 25 mg  25 mg Oral QPC breakfast Henreitta Leber, MD         Discharge Medications: Please see discharge summary for a list of discharge medications.  Relevant Imaging Results:  Relevant Lab Results:   Additional Information [id number]  Shela Leff, LCSW

## 2017-04-17 NOTE — Consult Note (Signed)
Obstetric and Gynecology  Subjective  [name] [name] is a 83 y.o. female G5P5 on whom gyn was consulted on 04/16/17 for postmenopausal bleeding. She is s/p hysterectomy.  Abdominal pelvic ultrasound notes s/p hysterotomy, no ovarian tissue identified but otherwise normal adnexa without massess or free fluid, but a fluid filled structure distal to vaginal cuff. On review of images, this is a simple fluid without evidence of abscess or blood clot.  Other findings: No abnormal free fluid. There is fluid filled tubular shaped structure posterior to the urinary bladder. I favor this represents a fluid filled vaginal cuff. This may be related to the external catheter in place.   Objective   Vitals:   04/17/17 1500 04/17/17 1600  BP: (!) 153/83 (!) 139/93  Pulse: 100 96  Resp: 19 17  Temp:    SpO2: 96% 96%     Intake/Output Summary (Last 24 hours) at 04/17/17 1720 Last data filed at 04/17/17 0200  Gross per 24 hour  Intake                0 ml  Output               60 ml  Net              -60 ml    Labs: Results for orders placed or performed during the hospital encounter of 04/15/17 (from the past 24 hour(s))  Glucose, capillary     Status: Abnormal   Collection Time: 04/16/17  8:05 PM  Result Value Ref Range   Glucose-Capillary 131 (H) 65 - 99 mg/dL  Glucose, capillary     Status: Abnormal   Collection Time: 04/17/17 12:05 AM  Result Value Ref Range   Glucose-Capillary 110 (H) 65 - 99 mg/dL  CBC     Status: Abnormal   Collection Time: 04/17/17  4:25 AM  Result Value Ref Range   WBC 6.4 3.6 - 11.0 K/uL   RBC 3.43 (L) 3.80 - 5.20 MIL/uL   Hemoglobin 11.1 (L) 12.0 - 16.0 g/dL   HCT 32.4 (L) 35.0 - 47.0 %   MCV 94.6 80.0 - 100.0 fL   MCH 32.4 26.0 - 34.0 pg   MCHC 34.3 32.0 - 36.0 g/dL   RDW 16.1 (H) 11.5 - 14.5 %   Platelets 274 150 - 440 K/uL  Basic metabolic panel     Status: Abnormal   Collection Time: 04/17/17  4:25 AM  Result Value Ref Range   Sodium 147 (H) 135  - 145 mmol/L   Potassium 3.6 3.5 - 5.1 mmol/L   Chloride 110 101 - 111 mmol/L   CO2 26 22 - 32 mmol/L   Glucose, Bld 215 (H) 65 - 99 mg/dL   BUN 33 (H) 6 - 20 mg/dL   Creatinine, Ser 1.17 (H) 0.44 - 1.00 mg/dL   Calcium 9.5 8.9 - 10.3 mg/dL   GFR calc non Af Amer 42 (L) >60 mL/min   GFR calc Af Amer 49 (L) >60 mL/min   Anion gap 11 5 - 15  Glucose, capillary     Status: Abnormal   Collection Time: 04/17/17  4:45 AM  Result Value Ref Range   Glucose-Capillary 215 (H) 65 - 99 mg/dL   Comment 1 Notify RN    Comment 2 Document in Chart   Glucose, capillary     Status: Abnormal   Collection Time: 04/17/17  7:41 AM  Result Value Ref Range   Glucose-Capillary 183 (H) 65 -  99 mg/dL  Glucose, capillary     Status: Abnormal   Collection Time: 04/17/17 11:41 AM  Result Value Ref Range   Glucose-Capillary 283 (H) 65 - 99 mg/dL    Cultures: Results for orders placed or performed during the hospital encounter of 04/15/17  MRSA PCR Screening     Status: None   Collection Time: 04/15/17  9:25 PM  Result Value Ref Range Status   MRSA by PCR NEGATIVE NEGATIVE Final    Comment:        The GeneXpert MRSA Assay (FDA approved for NASAL specimens only), is one component of a comprehensive MRSA colonization surveillance program. It is not intended to diagnose MRSA infection nor to guide or monitor treatment for MRSA infections.     Imaging: Ct Head Wo Contrast  Result Date: 04/15/2017 CLINICAL DATA:  Fall EXAM: CT HEAD WITHOUT CONTRAST CT CERVICAL SPINE WITHOUT CONTRAST TECHNIQUE: Multidetector CT imaging of the head and cervical spine was performed following the standard protocol without intravenous contrast. Multiplanar CT image reconstructions of the cervical spine were also generated. COMPARISON:  CT head dated 04/05/2017 FINDINGS: CT HEAD FINDINGS Brain: No evidence of acute infarction, hemorrhage, extra-axial collection or mass lesion/mass effect. Global cortical and central atrophy.  Secondary ventricular prominence. Extensive subcortical white matter and periventricular small vessel ischemic changes. Vascular: No hyperdense vessel or unexpected calcification. Skull: Normal. Negative for fracture or focal lesion. Sinuses/Orbits: The visualized paranasal sinuses are essentially clear. The mastoid air cells are unopacified. Other: None. CT CERVICAL SPINE FINDINGS Alignment: Normal cervical lordosis. Skull base and vertebrae: No acute fracture. No primary bone lesion or focal pathologic process. Soft tissues and spinal canal: No prevertebral fluid or swelling. No visible canal hematoma. Disc levels: Mild degenerative changes of the mid/lower cervical spine. Spinal canal is patent. Upper chest: Visualized lung apices are clear. Other: Visualized thyroid is unremarkable. IMPRESSION: No evidence of acute intracranial abnormality. Atrophy with small vessel ischemic changes. No evidence of traumatic injury to the cervical spine. Mild degenerative changes. Electronically Signed   By: Julian Hy M.D.   On: 04/15/2017 18:50   Ct Head Wo Contrast  Result Date: 04/05/2017 CLINICAL DATA:  Altered mental status, nausea, and vomiting. EXAM: CT HEAD WITHOUT CONTRAST TECHNIQUE: Contiguous axial images were obtained from the base of the skull through the vertex without intravenous contrast. COMPARISON:  02/12/2017 FINDINGS: Brain: Small chronic infarcts are again seen in the cerebellum and left basal ganglia. Extensive confluent hypoattenuation throughout the cerebral white matter bilaterally is unchanged. Ventricular enlargement is unchanged and may reflect advanced central predominant cerebral atrophy although normal pressure hydrocephalus is not excluded in the appropriate clinical setting. No acute large territory infarct, intracranial hemorrhage, mass, midline shift, or extra-axial fluid collection is identified. Vascular: No hyperdense vessel. Skull: No fracture focal osseous lesion.  Sinuses/Orbits: Visualized paranasal sinuses and mastoid air cells are clear. Bilateral cataract extraction. Other: None. IMPRESSION: 1. No evidence of acute intracranial abnormality. 2. Extensive chronic small vessel ischemic disease. 3. Unchanged ventricular enlargement which may reflect cerebral atrophy versus normal pressure hydrocephalus. Electronically Signed   By: Logan Bores M.D.   On: 04/05/2017 17:00   Ct Cervical Spine Wo Contrast  Result Date: 04/15/2017 CLINICAL DATA:  Fall EXAM: CT HEAD WITHOUT CONTRAST CT CERVICAL SPINE WITHOUT CONTRAST TECHNIQUE: Multidetector CT imaging of the head and cervical spine was performed following the standard protocol without intravenous contrast. Multiplanar CT image reconstructions of the cervical spine were also generated. COMPARISON:  CT head dated 04/05/2017  FINDINGS: CT HEAD FINDINGS Brain: No evidence of acute infarction, hemorrhage, extra-axial collection or mass lesion/mass effect. Global cortical and central atrophy. Secondary ventricular prominence. Extensive subcortical white matter and periventricular small vessel ischemic changes. Vascular: No hyperdense vessel or unexpected calcification. Skull: Normal. Negative for fracture or focal lesion. Sinuses/Orbits: The visualized paranasal sinuses are essentially clear. The mastoid air cells are unopacified. Other: None. CT CERVICAL SPINE FINDINGS Alignment: Normal cervical lordosis. Skull base and vertebrae: No acute fracture. No primary bone lesion or focal pathologic process. Soft tissues and spinal canal: No prevertebral fluid or swelling. No visible canal hematoma. Disc levels: Mild degenerative changes of the mid/lower cervical spine. Spinal canal is patent. Upper chest: Visualized lung apices are clear. Other: Visualized thyroid is unremarkable. IMPRESSION: No evidence of acute intracranial abnormality. Atrophy with small vessel ischemic changes. No evidence of traumatic injury to the cervical spine.  Mild degenerative changes. Electronically Signed   By: Julian Hy M.D.   On: 04/15/2017 18:50   Mr Brain Wo Contrast  Result Date: 04/16/2017 CLINICAL DATA:  Altered mental status EXAM: MRI HEAD WITHOUT CONTRAST TECHNIQUE: Multiplanar, multiecho pulse sequences of the brain and surrounding structures were obtained without intravenous contrast. COMPARISON:  Head CT 04/15/2017 FINDINGS: Brain: The midline structures are normal. There is no focal diffusion restriction to indicate acute infarct. There is diffuse confluent hyperintense T2-weighted signal within the periventricular, deep and juxtacortical white matter, most often seen in the setting of chronic microvascular ischemia. No intraparenchymal hematoma or chronic microhemorrhage. Advanced volume loss without lobar predilection. The dura is normal and there is no extra-axial collection. Vascular: Major intracranial arterial and venous sinus flow voids are preserved. Skull and upper cervical spine: The visualized skull base, calvarium, upper cervical spine and extracranial soft tissues are normal. Sinuses/Orbits: No fluid levels or advanced mucosal thickening. No mastoid or middle ear effusion. Normal orbits. IMPRESSION: 1. No acute intracranial abnormality. 2. Advanced atrophy and chronic ischemic microangiopathy. Electronically Signed   By: Ulyses Jarred M.D.   On: 04/16/2017 22:34   US Pelvis (transabdominal Only)  Result Date: 04/17/2017 CLINICAL DATA:  Postmenopausal bleeding.  Prior hysterectomy EXAM: TRANSABDOMINAL ULTRASOUND OF PELVIS TECHNIQUE: Transabdominal ultrasound examination of the pelvis was performed including evaluation of the uterus, ovaries, adnexal regions, and pelvic cul-de-sac. COMPARISON:  CT 06/08/2015 FINDINGS: Uterus Measurements: Prior hysterectomy. Endometrium Thickness: N/A. Right ovary Measurements: Not visualized. No adnexal mass seen. Left ovary Measurements: None visualized. No adnexal mass seen. Other findings: No  abnormal free fluid. There is fluid filled tubular shaped structure posterior to the urinary bladder. I favor this represents a fluid filled vaginal cuff. This may be related to the external catheter in place. IMPRESSION: Fluid filled vaginal cuff noted. This may be related to the external catheter. No other pelvic mass or abnormality noted. Prior hysterectomy. Electronically Signed   By: Rolm Baptise M.D.   On: 04/17/2017 12:10   Dg Chest Port 1 View  Result Date: 04/15/2017 CLINICAL DATA:  Fall EXAM: PORTABLE CHEST 1 VIEW COMPARISON:  04/05/2017 FINDINGS: Lungs are clear.  No pleural effusion or pneumothorax. The heart is normal in size. IMPRESSION: No evidence of acute cardiopulmonary disease. Electronically Signed   By: Julian Hy M.D.   On: 04/15/2017 18:47   Dg Chest Portable 1 View  Result Date: 04/05/2017 CLINICAL DATA:  Altered mental status. EXAM: PORTABLE CHEST 1 VIEW COMPARISON:  Radiograph February 12, 2017. FINDINGS: The heart size and mediastinal contours are within normal limits. Atherosclerosis of thoracic aorta is  noted. No pneumothorax or pleural effusion is noted. Both lungs are clear. The visualized skeletal structures are unremarkable. IMPRESSION: Aortic atherosclerosis.  No acute cardiopulmonary abnormality seen. Electronically Signed   By: Marijo Conception, M.D.   On: 04/05/2017 09:07     Assessment   83 y.o. [place] [facility] Day: 3   Plan   1. At the present time, this ultrasound is reassuring. When she is stable, I recommend an outpatient thorough vaginal exam. Depending on sx at that time, IR drainage is a possibility. I am less worried about cancer or an acute infectious process with this imaging and believe that management of her other sx be completed prior to any exploratory surgery or further invasive pelvic examination, as I am not certain she is competent to consent to that exam at this time. 2. I am happy to f/u with her as an outpatient.   Please have her call  [phone] - [place] - to make an appointment with Dr. Leafy Ro at the time of discharge. Please call us or page with any questions, and we will sign off for now.  Thank you!

## 2017-04-17 NOTE — Progress Notes (Signed)
Inpatient Diabetes Program Recommendations  AACE/ADA: New Consensus Statement on Inpatient Glycemic Control (2015)  Target Ranges:  Prepandial:   less than 140 mg/dL      [facility] postprandial:   less than 180 mg/dL (1-2 hours)      Critically ill patients:  140 - 180 mg/dL   Lab Results  Component Value Date   GLUCAP 183 (H) 04/17/2017   HGBA1C 7.9 (H) 04/05/2017    Review of Glycemic Control  Results for [name], [name] (MRN [id number]) as of 04/17/2017 11:31  Ref. Range 04/16/2017 16:34 04/16/2017 20:05 04/17/2017 00:05 04/17/2017 04:45 04/17/2017 07:41  Glucose-Capillary Latest Ref Range: 65 - 99 mg/dL 112 (H) 131 (H) 110 (H) 215 (H) 183 (H)   Diabetes history: Type 2 Outpatient Diabetes medications:Novolog 3 units tid, Novolog 0-5 units qhs, Novolog 0-9 units tid, Lantus 15 units qday  Current orders for Inpatient glycemic control:Lantus 12 units qday, Novolog 0-9 units q6h  Inpatient Diabetes Program Recommendations:  Agree with current medications for blood sugar management.    Gentry Fitz, RN, BA, MHA, CDE Diabetes Coordinator Inpatient Diabetes Program  [phone] (Team Pager) [phone] ([place]) 04/17/2017 11:33 AM

## 2017-04-17 NOTE — Plan of Care (Signed)
Problem: SLP Dysphagia Goals Goal: Misc Dysphagia Goal Pt will safely tolerate po diet of least restrictive consistency w/ no overt s/s of aspiration noted by Staff/pt/family x3 sessions.    

## 2017-04-17 NOTE — Progress Notes (Signed)
PT Cancellation Note  Patient Details Name: [name] [name] MRN: [id number] DOB: [birth year]   Cancelled Treatment:    Reason Eval/Treat Not Completed: Other (comment) Reviewed chart, per RN pt is out of room for imaging. Will re-attempt at next available date.   Manfred Arch, SPT Manfred Arch 04/17/2017, 11:23 AM

## 2017-04-17 NOTE — Progress Notes (Signed)
PT Cancellation Note  Patient Details Name: [name] [name] MRN: [id number] DOB: 1931/12/30   Cancelled Treatment:    Reason Eval/Treat Not Completed: Other (comment) Treatment attempted, pt appeared confused and unable to comprehend questions and respond to simple commands. Will re-attempt at next available date.    Manfred Arch 04/17/2017, 2:11 PM

## 2017-04-18 ENCOUNTER — Inpatient Hospital Stay: Payer: Medicare Other

## 2017-04-18 LAB — BASIC METABOLIC PANEL
ANION GAP: 11 (ref 5–15)
Anion gap: 10 (ref 5–15)
BUN: 33 mg/dL — AB (ref 6–20)
BUN: 43 mg/dL — ABNORMAL HIGH (ref 6–20)
CALCIUM: 9.8 mg/dL (ref 8.9–10.3)
CHLORIDE: 110 mmol/L (ref 101–111)
CHLORIDE: 105 mmol/L (ref 101–111)
CO2: 27 mmol/L (ref 22–32)
CO2: 25 mmol/L (ref 22–32)
CREATININE: 1.32 mg/dL — AB (ref 0.44–1.00)
Calcium: 9.4 mg/dL (ref 8.9–10.3)
Creatinine, Ser: 1.51 mg/dL — ABNORMAL HIGH (ref 0.44–1.00)
GFR calc Af Amer: 42 mL/min — ABNORMAL LOW (ref >60)
GFR calc non Af Amer: 31 mL/min — ABNORMAL LOW (ref >60)
GFR, EST AFRICAN AMERICAN: 36 mL/min — AB (ref >60)
GFR, EST NON AFRICAN AMERICAN: 36 mL/min — AB (ref >60)
GLUCOSE: 795 mg/dL — AB (ref 65–99)
Glucose, Bld: 229 mg/dL — ABNORMAL HIGH (ref 65–99)
POTASSIUM: 4 mmol/L (ref 3.5–5.1)
Potassium: 3.5 mmol/L (ref 3.5–5.1)
SODIUM: 147 mmol/L — AB (ref 135–145)
Sodium: 141 mmol/L (ref 135–145)

## 2017-04-18 LAB — GLUCOSE, CAPILLARY
GLUCOSE-CAPILLARY: 127 mg/dL — AB (ref 65–99)
GLUCOSE-CAPILLARY: 166 mg/dL — AB (ref 65–99)
GLUCOSE-CAPILLARY: 213 mg/dL — AB (ref 65–99)
GLUCOSE-CAPILLARY: 384 mg/dL — AB (ref 65–99)
Glucose-Capillary: 246 mg/dL — ABNORMAL HIGH (ref 65–99)
Glucose-Capillary: >600 mg/dL (ref 65–99)
Glucose-Capillary: >600 mg/dL (ref 65–99)

## 2017-04-18 LAB — GLUCOSE, RANDOM: GLUCOSE: 751 mg/dL — AB (ref 65–99)

## 2017-04-18 MED ORDER — DEXTROSE-NACL 5-0.45 % IV SOLN
INTRAVENOUS | Status: DC
  Administered 2017-04-18: 23:00:00 via INTRAVENOUS

## 2017-04-18 MED ORDER — SODIUM CHLORIDE 0.9 % IV SOLN
INTRAVENOUS | Status: DC
  Administered 2017-04-18: 19:00:00 via INTRAVENOUS

## 2017-04-18 MED ORDER — DEXTROSE 50 % IV SOLN
25.0000 mL | INTRAVENOUS | Status: DC | PRN
  Filled 2017-04-18: qty 50

## 2017-04-18 MED ORDER — SODIUM CHLORIDE 0.9 % IV SOLN
INTRAVENOUS | Status: DC
  Administered 2017-04-18: 5.4 [IU]/h via INTRAVENOUS
  Filled 2017-04-18: qty 1

## 2017-04-18 MED ORDER — INSULIN REGULAR BOLUS VIA INFUSION: 0.0000 [IU] | Freq: Three times a day (TID) | INTRAVENOUS | Status: DC

## 2017-04-18 NOTE — Progress Notes (Signed)
[facility] [place] NAME: [name] [name]    MR#:  [id number]  DATE OF BIRTH:  [birth year]  SUBJECTIVE:   Pno issues  REVIEW OF SYSTEMS:    She has some dementia  Tolerating Diet:yes      DRUG ALLERGIES:   Allergies  Allergen Reactions  . Penicillins Rash    Has patient had a PCN reaction causing immediate rash, facial/tongue/throat swelling, SOB or lightheadedness with hypotension: No Has patient had a PCN reaction causing severe rash involving mucus membranes or skin necrosis: No Has patient had a PCN reaction that required hospitalization No Has patient had a PCN reaction occurring within the last 10 years: No If all of the above answers are "NO", then may proceed with Cephalosporin use.     VITALS:  Blood pressure 98/81, pulse (!) 104, temperature 98.4 F (36.9 C), temperature source Oral, resp. rate 20, height 5\' 6"  (1.676 m), weight 60.3 kg (132 lb 15 oz), SpO2 99 %.  PHYSICAL EXAMINATION:  Constitutional: Appears well-developed and well-nourished. No distress. HENT: Normocephalic. Marland Kitchen Oropharynx is clear and moist.  Eyes: Conjunctivae and EOM are normal. PERRLA, no scleral icterus.  Neck: Normal ROM. Neck supple. No JVD. No tracheal deviation. CVS: RRR, S1/S2 +, no murmurs, no gallops, no carotid bruit.  Pulmonary: Effort and breath sounds normal, no stridor, rhonchi, wheezes, rales.  Abdominal: Soft. BS +,  no distension, tenderness, rebound or guarding.  Musculoskeletal: Normal range of motion. No edema and no tenderness.  Neuro: oriented to  Name  Skin: Skin is warm and dry. No rash noted. Psychiatric: flat affect    LABORATORY PANEL:   CBC  Recent Labs Lab 04/17/17 0425  WBC 6.4  HGB 11.1*  HCT 32.4*  PLT 274   ------------------------------------------------------------------------------------------------------------------  Chemistries   Recent Labs Lab 04/15/17 1748  04/18/17 0455  NA 137  < >  147*  K 5.5*  < > 3.5  CL 99*  < > 110  CO2 15*  < > 27  GLUCOSE 720*  < > 229*  BUN 43*  < > 33*  CREATININE 2.01*  < > 1.32*  CALCIUM 9.7  < > 9.8  AST 24  --   --   ALT 22  --   --   ALKPHOS 115  --   --   BILITOT 2.1*  --   --   < > = values in this interval not displayed. ------------------------------------------------------------------------------------------------------------------  Cardiac Enzymes  Recent Labs Lab 04/15/17 1748  TROPONINI <0.03   ------------------------------------------------------------------------------------------------------------------  RADIOLOGY:  Mr Brain Wo Contrast  Result Date: 04/16/2017 CLINICAL DATA:  Altered mental status EXAM: MRI HEAD WITHOUT CONTRAST TECHNIQUE: Multiplanar, multiecho pulse sequences of the brain and surrounding structures were obtained without intravenous contrast. COMPARISON:  Head CT 04/15/2017 FINDINGS: Brain: The midline structures are normal. There is no focal diffusion restriction to indicate acute infarct. There is diffuse confluent hyperintense T2-weighted signal within the periventricular, deep and juxtacortical white matter, most often seen in the setting of chronic microvascular ischemia. No intraparenchymal hematoma or chronic microhemorrhage. Advanced volume loss without lobar predilection. The dura is normal and there is no extra-axial collection. Vascular: Major intracranial arterial and venous sinus flow voids are preserved. Skull and upper cervical spine: The visualized skull base, calvarium, upper cervical spine and extracranial soft tissues are normal. Sinuses/Orbits: No fluid levels or advanced mucosal thickening. No mastoid or middle ear effusion. Normal orbits. IMPRESSION: 1. No acute intracranial abnormality. 2.  Advanced atrophy and chronic ischemic microangiopathy. Electronically Signed   By: Ulyses Jarred M.D.   On: 04/16/2017 22:34   US Pelvis (transabdominal Only)  Result Date: 04/17/2017 CLINICAL  DATA:  Postmenopausal bleeding.  Prior hysterectomy EXAM: TRANSABDOMINAL ULTRASOUND OF PELVIS TECHNIQUE: Transabdominal ultrasound examination of the pelvis was performed including evaluation of the uterus, ovaries, adnexal regions, and pelvic cul-de-sac. COMPARISON:  CT 06/08/2015 FINDINGS: Uterus Measurements: Prior hysterectomy. Endometrium Thickness: N/A. Right ovary Measurements: Not visualized. No adnexal mass seen. Left ovary Measurements: None visualized. No adnexal mass seen. Other findings: No abnormal free fluid. There is fluid filled tubular shaped structure posterior to the urinary bladder. I favor this represents a fluid filled vaginal cuff. This may be related to the external catheter in place. IMPRESSION: Fluid filled vaginal cuff noted. This may be related to the external catheter. No other pelvic mass or abnormality noted. Prior hysterectomy. Electronically Signed   By: Rolm Baptise M.D.   On: 04/17/2017 12:10     ASSESSMENT AND PLAN:   83 year old female with a history of diabetes, COPD, CVA and mild dementia who presents from skilled nursing facility with altered mental status.   1. Acute metabolic encephalopathy in the setting of diabetic ketoacidosis  DKA has resolved  Patient's mental status has improved  MRI did not show CVA  2. DKA: This hasresolved She will continue outpatient regimen with close outpatient follow up with PCP.  3. Acute kidney injury in the setting of dehydration and DKA: Creatinine is relatively stable. She will be off of metformin for now until follow up with PCP  4. Vaginal bleeding: She was evaluated by OBGYN and underwent an abdominal pelvic ultrasound which shows no ovarian tissue identified but otherwise normal adnexa without massess or free fluid, but a fluid filled structure distal to vaginal cuff. On review of images, this is a simple fluid without evidence of abscess or blood clot. She will follow up with OBGYN Dr Leafy Ro as an  outpatient for thorough vaginal exam.  5. Hypokalemia: This is been repleted  6. Hypothyroidism: Continue Synthroid  7. History of orthostatic hypotension: Continue Midodrine  8. History of previous CVA: Continue aspirin, Plavix and statin  9. Hyperlipidemia: Continue statin  10. Depression: Continue Zoloft  11. Mild/Moderate dementia as per granddaughter   Management plans discussed with the patient she is in agreement.   CODE STATUS: FULL  TOTAL TIME TAKING CARE OF THIS PATIENT: 24 minutes.     POSSIBLE D/C today to SNF, DEPENDING ON CLINICAL CONDITION.   Rahel Carlton M.D on 04/18/2017 at 9:13 AM  Between 7am to 6pm - Pager - [phone] After 6pm go to [email or web address] - password [email or web address] [facility]  Office  [phone]  CC: Primary care physician; [name], DO  Note: This dictation was prepared with Dragon dictation along with smaller phrase technology. Any transcriptional errors that result from this process are unintentional.

## 2017-04-18 NOTE — Progress Notes (Signed)
SLP Cancellation Note  Patient Details Name: [name] [name] MRN: [id number] DOB: 1932/03/21   Cancelled treatment:       Pt declined SLP treatment.  Pt had completed morning meal upon st arrival. Pt refused trials with slp stating she could not eat any more. Pt appeared to have increased confusion.    [place] Sauber 04/18/2017, 10:44 AM

## 2017-04-18 NOTE — Progress Notes (Signed)
Pt oriented only to self, pleasant and cooperative.  NSR/Sinus tachycardia on telemetry.  Lungs clear.  Remains on insulin gtt, IVF changed to D5 1/2 NS at 100 ml/hr per order from Harless Nakayama NP.  Vital signs stable, afebrile.  Incontinent of urine.  Report given to Velna Hatchet, RN taking over care of pt.

## 2017-04-18 NOTE — Progress Notes (Signed)
Patient rested in bed this shift watching television.  Interactive and communicating with staff.  No discharge noted.  No acute distress noted.  Remains on nectar thick liquids.

## 2017-04-18 NOTE — Clinical Social Work Note (Signed)
CSW reached out to the patient's son [name] and granddaughter [name] to update on bed offers. Currently, the only offer is [facility], and the family wants to visit the location prior to making a decision. The CSW is going to contact [facility], the family's first choice, to inquire after possible LTC bed availability. CSW will continue to follow.  Santiago Bumpers, MSW, Latanya Presser  [phone]

## 2017-04-18 NOTE — Discharge Summary (Signed)
[facility] NAME: [name] [name]    MR#:  [id number]  DATE OF BIRTH:  December 29, 1931  DATE OF ADMISSION:  04/15/2017 ADMITTING PHYSICIAN: Henreitta Leber, MD  DATE OF DISCHARGE: 04/18/2017  PRIMARY CARE PHYSICIAN: [name], DO    ADMISSION DIAGNOSIS:  Hypernatremia [E87.0] Hyperglycemia [R73.9] Acute kidney injury ([place]) [N17.9] Diabetic ketoacidosis with coma associated with type 2 diabetes mellitus ([place]) [E11.11]  DISCHARGE DIAGNOSIS:  Active Problems:   DKA (diabetic ketoacidoses) ([place])   Hyperglycemia   Acute kidney injury ([place])   SECONDARY DIAGNOSIS:   Past Medical History:  Diagnosis Date  . Anxiety state, unspecified   . CAD (coronary artery disease)   . Cancer [facility])    2004 Right breast, found on mammogram, radiation therapy, Dr. Bryson Ha, uterine cancer,   . COPD (chronic obstructive pulmonary disease) ([place])   . Diabetes mellitus   . Esophageal reflux   . Heart burn   . Hyperlipidemia   . IBS (irritable bowel syndrome)   . Myocardial infarction [facility])    Cath negative except for 40% occlusion LAD.  Pt not candidate for betablocker or ACEI because of hypotension  . OSA (obstructive sleep apnea)   . Osteoporosis 02/21/09   DEXA scan showed osteoporosis with left femur T-score -2.8.  Marland Kitchen Presbyacusis   . Stroke ([place])   . Thyroid disease    Hypothyroid  . Vitamin D deficiency     HOSPITAL COURSE:   83 year old female with a history of diabetes, COPD, CVA and mild dementia who presents from skilled nursing facility with altered mental status.   1. Acute metabolic encephalopathy in the setting of diabetic ketoacidosis  DKA has resolved  Patient's mental status has improved  MRI did not show CVA  2. DKA: This has resolved She will continue outpatient regimen with close outpatient follow up with PCP.  3. Acute kidney injury in the setting of dehydration and DKA: Creatinine is relatively stable. She  will be off of metformin for now until follow up with PCP  4. Vaginal bleeding: She was evaluated by OBGYN and underwent an abdominal pelvic ultrasound which  shows no ovarian tissue identified but otherwise normal adnexa without massess or free fluid, but a fluid filled structure distal to vaginal cuff. On review of images, this is a simple fluid without evidence of abscess or blood clot. She will follow up with OBGYN Dr Leafy Ro as an outpatient for thorough vaginal exam.  5. Hypokalemia: This is been repleted  6. Hypothyroidism: Continue Synthroid  7. History of orthostatic hypotension: Continue Midodrine  8. History of previous CVA: Continue aspirin, Plavix and statin  9. Hyperlipidemia: Continue statin  10. Depression: Continue Zoloft  11. Mild/Moderate dementia as per granddaughter    DISCHARGE CONDITIONS AND DIET:   Stable Dysphagia 2 diet  CONSULTS OBTAINED:  Treatment Team:  Benjaman Kindler, MD  DRUG ALLERGIES:   Allergies  Allergen Reactions  . Penicillins Rash    Has patient had a PCN reaction causing immediate rash, facial/tongue/throat swelling, SOB or lightheadedness with hypotension: No Has patient had a PCN reaction causing severe rash involving mucus membranes or skin necrosis: No Has patient had a PCN reaction that required hospitalization No Has patient had a PCN reaction occurring within the last 10 years: No If all of the above answers are "NO", then may proceed with Cephalosporin use.     DISCHARGE MEDICATIONS:   Current Discharge Medication List    CONTINUE these  medications which have NOT CHANGED   Details  aspirin EC 81 MG tablet Take 1 tablet (81 mg total) by mouth daily. Qty: 90 tablet, Refills: 3    atorvastatin (LIPITOR) 80 MG tablet Take 1 tablet (80 mg total) by mouth daily. Qty: 90 tablet, Refills: 3    clopidogrel (PLAVIX) 75 MG tablet TAKE 1 TABLET(75 MG) BY MOUTH DAILY Qty: 90 tablet, Refills: 3    fluticasone  (FLONASE) 50 MCG/ACT nasal spray Place 2 sprays into both nostrils daily. Qty: 16 g, Refills: 6    glucagon 1 MG injection Inject 1 mg into the vein once as needed.    !! insulin aspart (NOVOLOG) 100 UNIT/ML injection Inject 3 Units into the skin 3 (three) times daily with meals. Qty: 10 mL, Refills: 0    !! insulin aspart (NOVOLOG) 100 UNIT/ML injection Inject 0-5 Units into the skin at bedtime. Qty: 10 mL, Refills: 0    !! insulin aspart (NOVOLOG) 100 UNIT/ML injection Inject 0-9 Units into the skin 3 (three) times daily with meals. Qty: 10 mL, Refills: 0    insulin glargine (LANTUS) 100 UNIT/ML injection Inject 0.15 mLs (15 Units total) into the skin daily. Qty: 10 mL, Refills: 0    levothyroxine (SYNTHROID, LEVOTHROID) 100 MCG tablet TAKE 1 TABLET(100 MCG) BY MOUTH DAILY Qty: 90 tablet, Refills: 3    loperamide (IMODIUM) 2 MG capsule Take 4 mg by mouth as needed for diarrhea or loose stools.    midodrine (PROAMATINE) 10 MG tablet Take 1 tablet (10 mg total) by mouth 3 (three) times daily with meals. Qty: 90 tablet, Refills: 1    pantoprazole (PROTONIX) 40 MG tablet Take 1 tablet (40 mg total) by mouth 2 (two) times daily. Qty: 180 tablet, Refills: 3    sertraline (ZOLOFT) 25 MG tablet Take 1 tablet (25 mg total) by mouth daily after breakfast. Qty: 30 tablet, Refills: 1     !! - Potential duplicate medications found. Please discuss with provider.    STOP taking these medications     metFORMIN (GLUCOPHAGE) 500 MG tablet           Today   CHIEF COMPLAINT:   no acute events over night per nursing   VITAL SIGNS:  Blood pressure 125/72, pulse 87, temperature 98.4 F (36.9 C), temperature source Oral, resp. rate 15, height 5\' 6"  (1.676 m), weight 60.3 kg (132 lb 15 oz), SpO2 96 %.   REVIEW OF SYSTEMS:  Review of Systems  Unable to perform ROS: Dementia     PHYSICAL EXAMINATION:  GENERAL:  83 y.o.-year-old patient lying in the bed with no acute distress.   NECK:  Supple, no jugular venous distention. No thyroid enlargement, no tenderness.  LUNGS: Normal breath sounds bilaterally, no wheezing, rales,rhonchi  No use of accessory muscles of respiration.  CARDIOVASCULAR: S1, S2 normal. No murmurs, rubs, or gallops.  ABDOMEN: Soft, non-tender, non-distended. Bowel sounds present. No organomegaly or mass.  EXTREMITIES: No pedal edema, cyanosis, or clubbing.  PSYCHIATRIC: The patient is alert and oriented x name and place not time.  SKIN: multiple brusies on face, hips shoulders  DATA REVIEW:   CBC  Recent Labs Lab 04/17/17 0425  WBC 6.4  HGB 11.1*  HCT 32.4*  PLT 274    Chemistries   Recent Labs Lab 04/15/17 1748  04/18/17 0455  NA 137  < > 147*  K 5.5*  < > 3.5  CL 99*  < > 110  CO2 15*  < > 27  GLUCOSE 720*  < > 229*  BUN 43*  < > 33*  CREATININE 2.01*  < > 1.32*  CALCIUM 9.7  < > 9.8  AST 24  --   --   ALT 22  --   --   ALKPHOS 115  --   --   BILITOT 2.1*  --   --   < > = values in this interval not displayed.  Cardiac Enzymes  Recent Labs Lab 04/15/17 1748  TROPONINI <0.03    Microbiology Results  @MICRORSLT48 @  RADIOLOGY:  Mr Brain Wo Contrast  Result Date: 04/16/2017 CLINICAL DATA:  Altered mental status EXAM: MRI HEAD WITHOUT CONTRAST TECHNIQUE: Multiplanar, multiecho pulse sequences of the brain and surrounding structures were obtained without intravenous contrast. COMPARISON:  Head CT 04/15/2017 FINDINGS: Brain: The midline structures are normal. There is no focal diffusion restriction to indicate acute infarct. There is diffuse confluent hyperintense T2-weighted signal within the periventricular, deep and juxtacortical white matter, most often seen in the setting of chronic microvascular ischemia. No intraparenchymal hematoma or chronic microhemorrhage. Advanced volume loss without lobar predilection. The dura is normal and there is no extra-axial collection. Vascular: Major intracranial arterial and venous  sinus flow voids are preserved. Skull and upper cervical spine: The visualized skull base, calvarium, upper cervical spine and extracranial soft tissues are normal. Sinuses/Orbits: No fluid levels or advanced mucosal thickening. No mastoid or middle ear effusion. Normal orbits. IMPRESSION: 1. No acute intracranial abnormality. 2. Advanced atrophy and chronic ischemic microangiopathy. Electronically Signed   By: Ulyses Jarred M.D.   On: 04/16/2017 22:34   US Pelvis (transabdominal Only)  Result Date: 04/17/2017 CLINICAL DATA:  Postmenopausal bleeding.  Prior hysterectomy EXAM: TRANSABDOMINAL ULTRASOUND OF PELVIS TECHNIQUE: Transabdominal ultrasound examination of the pelvis was performed including evaluation of the uterus, ovaries, adnexal regions, and pelvic cul-de-sac. COMPARISON:  CT 06/08/2015 FINDINGS: Uterus Measurements: Prior hysterectomy. Endometrium Thickness: N/A. Right ovary Measurements: Not visualized. No adnexal mass seen. Left ovary Measurements: None visualized. No adnexal mass seen. Other findings: No abnormal free fluid. There is fluid filled tubular shaped structure posterior to the urinary bladder. I favor this represents a fluid filled vaginal cuff. This may be related to the external catheter in place. IMPRESSION: Fluid filled vaginal cuff noted. This may be related to the external catheter. No other pelvic mass or abnormality noted. Prior hysterectomy. Electronically Signed   By: Rolm Baptise M.D.   On: 04/17/2017 12:10      Current Discharge Medication List    CONTINUE these medications which have NOT CHANGED   Details  aspirin EC 81 MG tablet Take 1 tablet (81 mg total) by mouth daily. Qty: 90 tablet, Refills: 3    atorvastatin (LIPITOR) 80 MG tablet Take 1 tablet (80 mg total) by mouth daily. Qty: 90 tablet, Refills: 3    clopidogrel (PLAVIX) 75 MG tablet TAKE 1 TABLET(75 MG) BY MOUTH DAILY Qty: 90 tablet, Refills: 3    fluticasone (FLONASE) 50 MCG/ACT nasal spray  Place 2 sprays into both nostrils daily. Qty: 16 g, Refills: 6    glucagon 1 MG injection Inject 1 mg into the vein once as needed.    !! insulin aspart (NOVOLOG) 100 UNIT/ML injection Inject 3 Units into the skin 3 (three) times daily with meals. Qty: 10 mL, Refills: 0    !! insulin aspart (NOVOLOG) 100 UNIT/ML injection Inject 0-5 Units into the skin at bedtime. Qty: 10 mL, Refills: 0    !! insulin aspart (NOVOLOG) 100  UNIT/ML injection Inject 0-9 Units into the skin 3 (three) times daily with meals. Qty: 10 mL, Refills: 0    insulin glargine (LANTUS) 100 UNIT/ML injection Inject 0.15 mLs (15 Units total) into the skin daily. Qty: 10 mL, Refills: 0    levothyroxine (SYNTHROID, LEVOTHROID) 100 MCG tablet TAKE 1 TABLET(100 MCG) BY MOUTH DAILY Qty: 90 tablet, Refills: 3    loperamide (IMODIUM) 2 MG capsule Take 4 mg by mouth as needed for diarrhea or loose stools.    midodrine (PROAMATINE) 10 MG tablet Take 1 tablet (10 mg total) by mouth 3 (three) times daily with meals. Qty: 90 tablet, Refills: 1    pantoprazole (PROTONIX) 40 MG tablet Take 1 tablet (40 mg total) by mouth 2 (two) times daily. Qty: 180 tablet, Refills: 3    sertraline (ZOLOFT) 25 MG tablet Take 1 tablet (25 mg total) by mouth daily after breakfast. Qty: 30 tablet, Refills: 1     !! - Potential duplicate medications found. Please discuss with provider.    STOP taking these medications     metFORMIN (GLUCOPHAGE) 500 MG tablet          Stable for discharge   Patient should follow up with pcp and OBGYN  CODE STATUS:     Code Status Orders        Start     Ordered   04/15/17 2103  Full code  Continuous     04/15/17 2102    Code Status History    Date Active Date Inactive Code Status Order ID Comments User Context   04/05/2017  4:08 PM 04/07/2017  9:19 PM Full Code [id number]  [name], MD Inpatient   02/12/2017 10:42 PM 02/16/2017  6:46 PM Full Code [id number]  Lance Coon, MD Inpatient    01/13/2017  8:22 PM 01/16/2017  7:10 PM Full Code [id number]  Karnes, Ubaldo Glassing, DO Inpatient   11/06/2016 11:26 AM 11/15/2016  2:06 AM DNR [id number]  Flora Lipps, MD Inpatient   11/06/2016  3:25 AM 11/06/2016 11:26 AM Full Code [id number]  Saundra Shelling, MD Inpatient   07/15/2016  6:36 PM 07/17/2016 10:11 PM Full Code [id number]  Fritzi Mandes, MD Inpatient   06/08/2015  9:01 PM 06/11/2015  9:20 PM Full Code [id number]  Henreitta Leber, MD Inpatient   06/08/2015  5:16 PM 06/08/2015  9:01 PM Full Code [id number]  Henreitta Leber, MD ED      TOTAL TIME TAKING CARE OF THIS PATIENT: 37 minutes.    Note: This dictation was prepared with Dragon dictation along with smaller phrase technology. Any transcriptional errors that result from this process are unintentional.  Tierre Netto M.D on 04/18/2017 at 7:52 AM  Between 7am to 6pm - Pager - [phone] After 6pm go to [email or web address] - password [email or web address] [facility]  Office  [phone]  CC: Primary care physician; [name], DO

## 2017-04-18 NOTE — Evaluation (Signed)
Physical Therapy Evaluation Patient Details Name: [name] [name] MRN: [id number] DOB: [birth year] Today's Date: 04/18/2017   History of Present Illness  [name] [name] is an 83yo white female who comes to [facility] on 9/19 c AMS found to be in DKA; SNF reports a fall 3d PTA. Pt has had at least 4 other admissions this year d/t AMS related to hyperglycemia and/or DKA. She has baseline dementia and limited mobility at baseline, often unable to perform meaningful distances of AMB with PT while admitted. PMH: CAD, D\M, COPD, BrCA, MI, CVA, osteoporosis.   Clinical Impression  Pt admitted with above diagnosis. Pt currently with functional limitations due to the deficits listed below (see "PT Problem List"). Upon entry, the patient is received semirecumbent in bed, no family/caregiver present.The pt is awake and agreeable to participate. No acute distress noted at this time. The pt is alert and oriented to person and place, pleasant, minimally verbal with flat affect, and not following simple commands consistently. HR in 100s upon entry, increases to 130s during attempted transfers at EOB. Pt requires totalA physical assistance for bed mobility and transfers, with significant weakness, but also with movement apraxia with delayed and labored initiation of movements. Pt is able to sit with good trunk posturing and balance at EOB, but cannot establish balance standing. Pt is participatory, but does best with tactile and visual cues\ for instruction. Pt will benefit from skilled PT intervention to increase independence and safety with basic mobility in preparation for discharge to the venue listed below.       Follow Up Recommendations SNF;Supervision/Assistance - 24 hour    Equipment Recommendations  None recommended by PT    Recommendations for Other Services       Precautions / Restrictions Precautions Precautions: Fall Restrictions Weight Bearing Restrictions: No      Mobility  Bed Mobility Overal  bed mobility: Needs Assistance Bed Mobility: Supine to Sit;Sit to Supine     Supine to sit: Total assist Sit to supine: +2 for physical assistance;Total assist   General bed mobility comments: difficulty inititating movement, mild bradykinesia.   Transfers Overall transfer level: Needs assistance Equipment used: 1 person hand held assist Transfers: Sit to/from Stand Sit to Stand: From elevated surface;+2 physical assistance;Total assist         General transfer comment: multiple attempts from varrying heights, pt unable to fully come to standing, not following VC for postural correction. Was able to perform STS trasfers on 9/10 with minA while admitted here.  (HR increases to 130s with attempted xfrers and sitting)  Ambulation/Gait                Stairs            Wheelchair Mobility    Modified Rankin (Stroke Patients Only)       Balance Overall balance assessment: History of Falls;Needs assistance                                           Pertinent Vitals/Pain Pain Assessment: No/denies pain    Home Living Family/patient expects to be discharged to:: Skilled nursing facility                      Prior Function           Comments: unclearPLOF, but from EMR over aultiple admissions, likely dependent on assistance with ADL  and pwerforming some transfers with supervision and rollator.      Hand Dominance        Extremity/Trunk Assessment        Lower Extremity Assessment Lower Extremity Assessment: Generalized weakness    Cervical / Trunk Assessment Cervical / Trunk Assessment: Kyphotic  Communication   Communication: HOH  Cognition Arousal/Alertness: Awake/alert Behavior During Therapy: Flat affect Overall Cognitive Status: History of cognitive impairments - at baseline                                 General Comments: Pt minimally verbal and requires a question asked several times which pt  inconsistently answers.        General Comments      Exercises     Assessment/Plan    PT Assessment Patient needs continued PT services  PT Problem List Decreased strength;Decreased range of motion;Decreased activity tolerance;Decreased balance;Decreased mobility;Decreased cognition;Decreased knowledge of use of DME;Decreased safety awareness       PT Treatment Interventions Gait training;Functional mobility training;Therapeutic activities;Therapeutic exercise;Balance training;Neuromuscular re-education;Cognitive remediation;Patient/family education;Wheelchair mobility training;Manual techniques    PT Goals (Current goals can be found in the Care Plan section)  Acute Rehab PT Goals PT Goal Formulation: Patient unable to participate in goal setting    Frequency Min 2X/week   Barriers to discharge Other (comment)      Co-evaluation               AM-PAC PT "6 Clicks" Daily Activity  Outcome Measure Difficulty turning over in bed (including adjusting bedclothes, sheets and blankets)?: Unable Difficulty moving from lying on back to sitting on the side of the bed? : Unable Difficulty sitting down on and standing up from a chair with arms (e.g., wheelchair, bedside commode, etc,.)?: Unable Help needed moving to and from a bed to chair (including a wheelchair)?: Total Help needed walking in hospital room?: Total Help needed climbing 3-5 steps with a railing? : Total 6 Click Score: 6    End of Session   Activity Tolerance: Patient tolerated treatment well;Patient limited by lethargy;Patient limited by fatigue Patient left: with call bell/phone within reach;with chair alarm set;in bed (meal tray presneted, in a chaired out position. ) Nurse Communication: Mobility status;Other (comment) PT Visit Diagnosis: Unsteadiness on feet (R26.81);Muscle weakness (generalized) (M62.81);Difficulty in walking, not elsewhere classified (R26.2);Other abnormalities of gait and mobility  (R26.89)    Time: 7615-1834 PT Time Calculation (min) (ACUTE ONLY): 14 min   Charges:   PT Evaluation $PT Eval Moderate Complexity: 1 Mod     PT G Codes:        9:34 AM, 26-Apr-2017 Etta Grandchild, PT, DPT Physical Therapist - [place] [phone] ([place])  [phone] (mobile)   Buccola,Allan C 04/26/17, 9:30 AM
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
## 2017-04-18 NOTE — Progress Notes (Signed)
Name: [name] [name] MRN: [id number] DOB: 08-31-1931    BRIEF PATIENT DESCRIPTION: 83 year old female admitted 09/19 with post fall at nursing facility, DKA on insulin drip, acute renal failure, acute encephalopathy secondary to DKA vs. CVA  SIGNIFICANT EVENTS  9/19 Patient admitted to the SDU with DKA on insulin gtt 9/20 weaned off insulin infusion.   STUDIES:  9/19 CT head>>No evidence of acute intracranial abnormality. Atrophy with small vessel ischemic changes.  SUBJECTIVE:  Pt resting in bed no acute distress   VITAL SIGNS: Temp:  [98 F (36.7 C)-98.8 F (37.1 C)] 98.4 F (36.9 C) (09/22 0000) Pulse Rate:  [80-135] 135 (09/22 0915) Resp:  [12-25] 18 (09/22 0900) BP: (98-153)/(60-93) 130/77 (09/22 0900) SpO2:  [93 %-99 %] 96 % (09/22 0900)  PHYSICAL EXAMINATION: General: Elderly confused female, NAD  Neuro: Awake but confused not following commands, PERRL HEENT: AT, [place], no JVD Cardiovascular: nsr, regular, no M/R/G Lungs:  Clear bilaterally, no wheezes,crackles,rhonchi noted Abdomen: Soft,NT,ND Musculoskeletal:  No edema,cyanosis noted Skin:  Warm,dry and intact   Recent Labs Lab 04/16/17 0851 04/17/17 0425 04/18/17 0455  NA 144 147* 147*  K 4.0 3.6 3.5  CL 110 110 110  CO2 24 26 27   BUN 46* 33* 33*  CREATININE 1.63* 1.17* 1.32*  GLUCOSE 165* 215* 229*    Recent Labs Lab 04/15/17 1748 04/16/17 0057 04/17/17 0425  HGB 10.2* 11.0* 11.1*  HCT 31.1* 31.6* 32.4*  WBC 7.7 9.5 6.4  PLT 276 346 274   Dg Shoulder Right  Result Date: 04/18/2017 CLINICAL DATA:  Shoulder pain following fall 1 week ago, initial encounter EXAM: RIGHT SHOULDER - 2+ VIEW COMPARISON:  None. FINDINGS: Degenerative changes of the acromioclavicular joint are seen. No acute fracture or dislocation is noted. The underlying bony thorax is within normal limits. No soft tissue abnormality is seen. IMPRESSION: Degenerative change without acute abnormality. Electronically Signed   By:  Inez Catalina M.D.   On: 04/18/2017 09:12   Mr Brain Wo Contrast  Result Date: 04/16/2017 CLINICAL DATA:  Altered mental status EXAM: MRI HEAD WITHOUT CONTRAST TECHNIQUE: Multiplanar, multiecho pulse sequences of the brain and surrounding structures were obtained without intravenous contrast. COMPARISON:  Head CT 04/15/2017 FINDINGS: Brain: The midline structures are normal. There is no focal diffusion restriction to indicate acute infarct. There is diffuse confluent hyperintense T2-weighted signal within the periventricular, deep and juxtacortical white matter, most often seen in the setting of chronic microvascular ischemia. No intraparenchymal hematoma or chronic microhemorrhage. Advanced volume loss without lobar predilection. The dura is normal and there is no extra-axial collection. Vascular: Major intracranial arterial and venous sinus flow voids are preserved. Skull and upper cervical spine: The visualized skull base, calvarium, upper cervical spine and extracranial soft tissues are normal. Sinuses/Orbits: No fluid levels or advanced mucosal thickening. No mastoid or middle ear effusion. Normal orbits. IMPRESSION: 1. No acute intracranial abnormality. 2. Advanced atrophy and chronic ischemic microangiopathy. Electronically Signed   By: Ulyses Jarred M.D.   On: 04/16/2017 22:34   US Pelvis (transabdominal Only)  Result Date: 04/17/2017 CLINICAL DATA:  Postmenopausal bleeding.  Prior hysterectomy EXAM: TRANSABDOMINAL ULTRASOUND OF PELVIS TECHNIQUE: Transabdominal ultrasound examination of the pelvis was performed including evaluation of the uterus, ovaries, adnexal regions, and pelvic cul-de-sac. COMPARISON:  CT 06/08/2015 FINDINGS: Uterus Measurements: Prior hysterectomy. Endometrium Thickness: N/A. Right ovary Measurements: Not visualized. No adnexal mass seen. Left ovary Measurements: None visualized. No adnexal mass seen. Other findings: No abnormal free fluid. There is  fluid filled tubular shaped  structure posterior to the urinary bladder. I favor this represents a fluid filled vaginal cuff. This may be related to the external catheter in place. IMPRESSION: Fluid filled vaginal cuff noted. This may be related to the external catheter. No other pelvic mass or abnormality noted. Prior hysterectomy. Electronically Signed   By: Rolm Baptise M.D.   On: 04/17/2017 12:10    ASSESSMENT / PLAN:  Acute Diabetic Ketoacidosis-resolved   Acute encephalopathy secondary to DKA vs. CVA  Acute kidney injury related to dehydration secondary to DKA- improved.  Vaginal Bleeding  Hx: Hypothyroidism, CVA, Depression, Hyperlipidemia  P: Transitioned off insulin gtt Continue scheduled Novolog, SSI, and Lantus Diabetes Coordinator and OBGYN consulted appreciate input  Speech consulted appreciate input Continue Aspirin ,Plavix and Synthroid Continue Atorvastatin Continue Zoloft  Deep Ashby Dawes, MD.   Board Certified in Internal Medicine, Pulmonary Medicine, [place], and Sleep Medicine.  [facility] Office Number: [phone] Pager: [phone]  Patricia Pesa, M.D.  Merton Border, M.D

## 2017-04-18 NOTE — Progress Notes (Signed)
[facility] Progress Note Patient Name: [name] [name] DOB: 26-Jun-1932 MRN: [id number]   Date of Service  04/18/2017  HPI/Events of Note  Hyperglycemia No gap on BMET  eICU Interventions  Insulin IV drip placed, not for DKA     Intervention Category Major Interventions: Hyperglycemia - active titration of insulin therapy  Brenyn Petrey 04/18/2017, 6:17 PM

## 2017-04-18 NOTE — Progress Notes (Signed)
[facility] Progress Note Patient Name: [name] [name] DOB: 1931-12-10 MRN: [id number]   Date of Service  04/18/2017  HPI/Events of Note  New patient evaluation  eICU Interventions  Nothing further to add     Intervention Category Major Interventions: Other:  YACOUB,WESAM 04/18/2017, 5:14 PM

## 2017-04-19 LAB — BASIC METABOLIC PANEL
Anion gap: 8 (ref 5–15)
Anion gap: 7 (ref 5–15)
BUN: 40 mg/dL — AB (ref 6–20)
BUN: 36 mg/dL — AB (ref 6–20)
CALCIUM: 9.7 mg/dL (ref 8.9–10.3)
CO2: 27 mmol/L (ref 22–32)
CO2: 25 mmol/L (ref 22–32)
CREATININE: 1.19 mg/dL — AB (ref 0.44–1.00)
Calcium: 9.9 mg/dL (ref 8.9–10.3)
Chloride: 114 mmol/L — ABNORMAL HIGH (ref 101–111)
Chloride: 117 mmol/L — ABNORMAL HIGH (ref 101–111)
Creatinine, Ser: 1.45 mg/dL — ABNORMAL HIGH (ref 0.44–1.00)
GFR calc Af Amer: 37 mL/min — ABNORMAL LOW (ref >60)
GFR calc Af Amer: 48 mL/min — ABNORMAL LOW (ref >60)
GFR, EST NON AFRICAN AMERICAN: 32 mL/min — AB (ref >60)
GFR, EST NON AFRICAN AMERICAN: 41 mL/min — AB (ref >60)
GLUCOSE: 319 mg/dL — AB (ref 65–99)
GLUCOSE: 122 mg/dL — AB (ref 65–99)
POTASSIUM: 3.1 mmol/L — AB (ref 3.5–5.1)
Potassium: 4.2 mmol/L (ref 3.5–5.1)
Sodium: 149 mmol/L — ABNORMAL HIGH (ref 135–145)
Sodium: 149 mmol/L — ABNORMAL HIGH (ref 135–145)

## 2017-04-19 LAB — GLUCOSE, CAPILLARY
GLUCOSE-CAPILLARY: 299 mg/dL — AB (ref 65–99)
GLUCOSE-CAPILLARY: 224 mg/dL — AB (ref 65–99)
GLUCOSE-CAPILLARY: 377 mg/dL — AB (ref 65–99)
GLUCOSE-CAPILLARY: 51 mg/dL — AB (ref 65–99)
GLUCOSE-CAPILLARY: 184 mg/dL — AB (ref 65–99)
Glucose-Capillary: 54 mg/dL — ABNORMAL LOW (ref 65–99)
Glucose-Capillary: 136 mg/dL — ABNORMAL HIGH (ref 65–99)

## 2017-04-19 LAB — CBC
HEMATOCRIT: 35.7 % (ref 35.0–47.0)
Hemoglobin: 12 g/dL (ref 12.0–16.0)
MCH: 31.9 pg (ref 26.0–34.0)
MCHC: 33.6 g/dL (ref 32.0–36.0)
MCV: 94.9 fL (ref 80.0–100.0)
PLATELETS: 311 10*3/uL (ref 150–440)
RBC: 3.76 MIL/uL — AB (ref 3.80–5.20)
RDW: 16.7 % — AB (ref 11.5–14.5)
WBC: 6.6 10*3/uL (ref 3.6–11.0)

## 2017-04-19 LAB — MAGNESIUM: Magnesium: 2.1 mg/dL (ref 1.7–2.4)

## 2017-04-19 MED ORDER — LACTATED RINGERS IV SOLN
INTRAVENOUS | Status: DC
  Administered 2017-04-19 – 2017-04-20 (×3): via INTRAVENOUS

## 2017-04-19 MED ORDER — SODIUM CHLORIDE 0.9 % IV BOLUS (SEPSIS): 1000.0000 mL | INTRAVENOUS | Status: AC

## 2017-04-19 MED ORDER — SODIUM CHLORIDE 0.9 % IV SOLN: INTRAVENOUS | Status: DC

## 2017-04-19 MED ORDER — POTASSIUM CHLORIDE 20 MEQ PO PACK
60.0000 meq | PACK | Freq: Once | ORAL | Status: AC
  Administered 2017-04-19: 60 meq via ORAL
  Filled 2017-04-19: qty 3

## 2017-04-19 MED ORDER — INSULIN ASPART 100 UNIT/ML ~~LOC~~ SOLN
0.0000 [IU] | SUBCUTANEOUS | Status: DC
  Administered 2017-04-19: 20 [IU] via SUBCUTANEOUS
  Administered 2017-04-19: 4 [IU] via SUBCUTANEOUS
  Administered 2017-04-19: 7 [IU] via SUBCUTANEOUS
  Filled 2017-04-19 (×3): qty 1

## 2017-04-19 MED ORDER — INSULIN GLARGINE 100 UNIT/ML ~~LOC~~ SOLN
10.0000 [IU] | Freq: Once | SUBCUTANEOUS | Status: AC
  Administered 2017-04-19: 10 [IU] via SUBCUTANEOUS
  Filled 2017-04-19 (×2): qty 0.1

## 2017-04-19 MED ORDER — INSULIN GLARGINE 100 UNIT/ML ~~LOC~~ SOLN
15.0000 [IU] | Freq: Every day | SUBCUTANEOUS | Status: DC
  Administered 2017-04-19 – 2017-04-20 (×2): 15 [IU] via SUBCUTANEOUS
  Filled 2017-04-19 (×3): qty 0.15

## 2017-04-19 MED ORDER — PANTOPRAZOLE SODIUM 40 MG IV SOLR
40.0000 mg | Freq: Two times a day (BID) | INTRAVENOUS | Status: DC
  Administered 2017-04-19 – 2017-04-20 (×2): 40 mg via INTRAVENOUS
  Filled 2017-04-19 (×2): qty 40

## 2017-04-19 MED ORDER — INSULIN GLARGINE 100 UNIT/ML ~~LOC~~ SOLN
10.0000 [IU] | Freq: Every day | SUBCUTANEOUS | Status: DC
  Administered 2017-04-19: 10 [IU] via SUBCUTANEOUS
  Filled 2017-04-19: qty 0.1

## 2017-04-19 MED ORDER — INSULIN ASPART 100 UNIT/ML ~~LOC~~ SOLN
0.0000 [IU] | SUBCUTANEOUS | Status: DC
  Administered 2017-04-19: 8 [IU] via SUBCUTANEOUS
  Filled 2017-04-19: qty 1

## 2017-04-19 NOTE — Progress Notes (Signed)
* [facility]     Assessment and Plan:  Acute Diabetic Ketoacidosis-resolved   Returned to ICU with hyperglycemia, improving.  Acute kidney injury related to dehydration secondary to DKA Pseudohyperkalemia-resolved Vaginal Bleeding  Hx: Hypothyroidism, CVA, Depression, Hyperlipidemia  P: Transitioned off insulin gtt Continue scheduled Novolog, SSI, and Lantus Diabetes Coordinator and OBGYN consulted appreciate input  Continue Aspirin ,Plavix and Synthroid Continue Atorvastatin Continue Zoloft   Date: 04/19/2017  MRN# [id number] KEREN ALVERIO 10-Sep-1931   REMMI ARMENTEROS is a 83 y.o. old female seen in follow up for chief complaint of  Chief Complaint  Patient presents with  . Fall     HPI:   No new complaints today.   Medication:    Current Facility-Administered Medications:  .  acetaminophen (TYLENOL) tablet 650 mg, 650 mg, Oral, Q6H PRN **OR** acetaminophen (TYLENOL) suppository 650 mg, 650 mg, Rectal, Q6H PRN, Sainani, Vivek J, MD .  aspirin EC tablet 81 mg, 81 mg, Oral, Daily, Henreitta Leber, MD, 81 mg at 04/19/17 0821 .  atorvastatin (LIPITOR) tablet 80 mg, 80 mg, Oral, Daily, Henreitta Leber, MD, 80 mg at 04/19/17 0820 .  chlorhexidine (PERIDEX) 0.12 % solution 15 mL, 15 mL, Mouth Rinse, BID, Varughese, Bincy S, NP, 15 mL at 04/19/17 0821 .  clopidogrel (PLAVIX) tablet 75 mg, 75 mg, Oral, Daily, Henreitta Leber, MD, 75 mg at 04/19/17 0820 .  dextrose 50 % solution 25 mL, 25 mL, Intravenous, PRN, Rush Farmer, MD .  heparin injection 5,000 Units, 5,000 Units, Subcutaneous, Q8H, Henreitta Leber, MD, 5,000 Units at 04/19/17 0555 .  insulin aspart (novoLOG) injection 0-20 Units, 0-20 Units, Subcutaneous, Q4H, Tukov, Magadalene S, NP, 7 Units at 04/19/17 0818 .  insulin glargine (LANTUS) injection 15 Units, 15 Units, Subcutaneous, QHS, Tukov, Magadalene S, NP .  lactated ringers infusion, , Intravenous, Continuous, Tukov, Magadalene S,  NP, Last Rate: 125 mL/hr at 04/19/17 0815 .  levothyroxine (SYNTHROID, LEVOTHROID) tablet 100 mcg, 100 mcg, Oral, QAC breakfast, Henreitta Leber, MD, 100 mcg at 04/19/17 4431 .  MEDLINE mouth rinse, 15 mL, Mouth Rinse, q12n4p, Varughese, Bincy S, NP, 15 mL at 04/17/17 1200 .  midodrine (PROAMATINE) tablet 10 mg, 10 mg, Oral, TID WC, Sainani, Belia Heman, MD, 10 mg at 04/19/17 0820 .  morphine 2 MG/ML injection 0.5-1 mg, 0.5-1 mg, Intravenous, Q4H PRN, Awilda Bill, NP .  multivitamin with minerals tablet 1 tablet, 1 tablet, Oral, Daily, Mody, Sital, MD, 1 tablet at 04/19/17 0820 .  [DISCONTINUED] ondansetron (ZOFRAN) tablet 4 mg, 4 mg, Oral, Q6H PRN **OR** ondansetron (ZOFRAN) injection 4 mg, 4 mg, Intravenous, Q6H PRN, Sainani, Vivek J, MD .  pantoprazole (PROTONIX) EC tablet 40 mg, 40 mg, Oral, BID, Henreitta Leber, MD, 40 mg at 04/19/17 0821 .  sertraline (ZOLOFT) tablet 25 mg, 25 mg, Oral, QPC breakfast, Sainani, Belia Heman, MD, 25 mg at 04/19/17 0820   Allergies:  Penicillins  Review of Systems: Gen:  Denies  fever, sweats. HEENT: Denies blurred vision. Cvc:  No dizziness, chest pain or heaviness Resp:   Denies cough or sputum porduction. Gi: Denies swallowing difficulty, stomach pain. constipation, bowel incontinence Gu:  Denies bladder incontinence, burning urine Ext:   No Joint pain, stiffness. Skin: No skin rash, easy bruising. Endoc:  No polyuria, polydipsia. Psych: No depression, insomnia. Other:  All other systems were reviewed and found to be negative other than what is mentioned in the HPI.   Physical  Examination:   VS: BP (!) 132/95   Pulse 96   Temp 97.9 F (36.6 C) (Oral)   Resp 20   Ht 5\' 6"  (1.676 m)   Wt 132 lb 15 oz (60.3 kg)   SpO2 97%   BMI 21.46 kg/m    General Appearance: No distress  Neuro:without focal findings,  speech normal,  HEENT: PERRLA, EOM intact. Pulmonary: normal breath sounds, No wheezing.   CardiovascularNormal S1,S2.  No m/r/g.     Abdomen: Benign, Soft, non-tender. Renal:  No costovertebral tenderness  GU:  Not performed at this time. Endoc: No evident thyromegaly, no signs of acromegaly. Skin:   warm, no rash. Extremities: normal, no cyanosis, clubbing.   LABORATORY PANEL:   CBC  Recent Labs Lab 04/19/17 0458  WBC 6.6  HGB 12.0  HCT 35.7  PLT 311   ------------------------------------------------------------------------------------------------------------------  Chemistries   Recent Labs Lab 04/15/17 1748  04/19/17 0458  NA 137  < > 149*  K 5.5*  < > 3.1*  CL 99*  < > 114*  CO2 15*  < > 27  GLUCOSE 720*  < > 319*  BUN 43*  < > 40*  CREATININE 2.01*  < > 1.45*  CALCIUM 9.7  < > 9.9  AST 24  --   --   ALT 22  --   --   ALKPHOS 115  --   --   BILITOT 2.1*  --   --   < > = values in this interval not displayed. ------------------------------------------------------------------------------------------------------------------  Cardiac Enzymes  Recent Labs Lab 04/15/17 1748  TROPONINI <0.03   ------------------------------------------------------------  RADIOLOGY:   No results found for this or any previous visit. Results for orders placed during the hospital encounter of 01/13/17  DG Chest 2 View   Narrative CLINICAL DATA:  History of right breast cancer with vomiting.  EXAM: CHEST  2 VIEW  COMPARISON:  11/11/2016  FINDINGS: Lungs are clear without airspace disease or pulmonary edema. No large pleural effusions. Thoracic aorta is heavily calcified. There is some scarring at the lung apices. Heart size is normal. No acute bone abnormality.  IMPRESSION: No active cardiopulmonary disease.   Electronically Signed   By: Markus Daft M.D.   On: 01/13/2017 16:25    ------------------------------------------------------------------------------------------------------------------  Thank  you for allowing [facility] to assist in the care of your  patient. Our recommendations are noted above.  Please contact us if we can be of further service.   Marda Stalker, MD.  [facility] Office Number: [phone]  Patricia Pesa, M.D.  Merton Border, M.D  04/19/2017

## 2017-04-19 NOTE — Progress Notes (Signed)
[facility] at [place] NAME: [name] [name]    MR#:  [id number]  DATE OF BIRTH:  January 12, 1932  SUBJECTIVE:   Patient had elevated blood sugars after being transferred to floor care yesterday. In the ICU her blood sugars were controlled.  She was on insulin drip but this has been stopped since midnight. She is transitioned to subcutaneous insulin and sliding scale. REVIEW OF SYSTEMS:    Unable to obtain due to dementia Tolerating Diet:yes      DRUG ALLERGIES:   Allergies  Allergen Reactions  . Penicillins Rash    Has patient had a PCN reaction causing immediate rash, facial/tongue/throat swelling, SOB or lightheadedness with hypotension: No Has patient had a PCN reaction causing severe rash involving mucus membranes or skin necrosis: No Has patient had a PCN reaction that required hospitalization No Has patient had a PCN reaction occurring within the last 10 years: No If all of the above answers are "NO", then may proceed with Cephalosporin use.     VITALS:  Blood pressure 117/61, pulse 83, temperature 97.9 F (36.6 C), temperature source Oral, resp. rate 15, height 5\' 6"  (1.676 m), weight 60.3 kg (132 lb 15 oz), SpO2 94 %.  PHYSICAL EXAMINATION:  Constitutional: Appears well-developed and well-nourished. No distress. HENT: Normocephalic. Marland Kitchen Oropharynx is clear and moist.  Eyes: Conjunctivae and EOM are normal. PERRLA, no scleral icterus.  Neck: Normal ROM. Neck supple. No JVD. No tracheal deviation. CVS: RRR, S1/S2 +, no murmurs, no gallops, no carotid bruit.  Pulmonary: Effort and breath sounds normal, no stridor, rhonchi, wheezes, rales.  Abdominal: Soft. BS +,  no distension, tenderness, rebound or guarding.  Musculoskeletal: Normal range of motion. No edema and no tenderness.  Neuro: oriented to  Name Not place or time Skin: Skin is warm and dry. No rash noted. Psychiatric: flat affect    LABORATORY PANEL:   CBC  Recent  Labs Lab 04/19/17 0458  WBC 6.6  HGB 12.0  HCT 35.7  PLT 311   ------------------------------------------------------------------------------------------------------------------  Chemistries   Recent Labs Lab 04/15/17 1748  04/19/17 0458  NA 137  < > 149*  K 5.5*  < > 3.1*  CL 99*  < > 114*  CO2 15*  < > 27  GLUCOSE 720*  < > 319*  BUN 43*  < > 40*  CREATININE 2.01*  < > 1.45*  CALCIUM 9.7  < > 9.9  AST 24  --   --   ALT 22  --   --   ALKPHOS 115  --   --   BILITOT 2.1*  --   --   < > = values in this interval not displayed. ------------------------------------------------------------------------------------------------------------------  Cardiac Enzymes  Recent Labs Lab 04/15/17 1748  TROPONINI <0.03   ------------------------------------------------------------------------------------------------------------------  RADIOLOGY:  Dg Shoulder Right  Result Date: 04/18/2017 CLINICAL DATA:  Shoulder pain following fall 1 week ago, initial encounter EXAM: RIGHT SHOULDER - 2+ VIEW COMPARISON:  None. FINDINGS: Degenerative changes of the acromioclavicular joint are seen. No acute fracture or dislocation is noted. The underlying bony thorax is within normal limits. No soft tissue abnormality is seen. IMPRESSION: Degenerative change without acute abnormality. Electronically Signed   By: Inez Catalina M.D.   On: 04/18/2017 09:12   US Pelvis (transabdominal Only)  Result Date: 04/17/2017 CLINICAL DATA:  Postmenopausal bleeding.  Prior hysterectomy EXAM: TRANSABDOMINAL ULTRASOUND OF PELVIS TECHNIQUE: Transabdominal ultrasound examination of the pelvis was performed including evaluation of the uterus,  ovaries, adnexal regions, and pelvic cul-de-sac. COMPARISON:  CT 06/08/2015 FINDINGS: Uterus Measurements: Prior hysterectomy. Endometrium Thickness: N/A. Right ovary Measurements: Not visualized. No adnexal mass seen. Left ovary Measurements: None visualized. No adnexal mass seen.  Other findings: No abnormal free fluid. There is fluid filled tubular shaped structure posterior to the urinary bladder. I favor this represents a fluid filled vaginal cuff. This may be related to the external catheter in place. IMPRESSION: Fluid filled vaginal cuff noted. This may be related to the external catheter. No other pelvic mass or abnormality noted. Prior hysterectomy. Electronically Signed   By: Rolm Baptise M.D.   On: 04/17/2017 12:10     ASSESSMENT AND PLAN:   83 year old female with a history of diabetes, COPD, CVA and mild dementia who presents from skilled nursing facility with altered mental status.   1. Acute metabolic encephalopathy in the setting of diabetic ketoacidosis  Patient's mental status has improvedAnd is at baseline MRI did not show CVA  2. DKA: This hasresolved She will continue current regimen. Continue to follow blood sugars closely.   3. Acute kidney injury in the setting of dehydration and DKA: Creatinine is stable. She will be off of metformin for now until follow up with PCP  4. Vaginal bleeding: She was evaluated by OBGYN and underwent an abdominal pelvic ultrasound which shows no ovarian tissue identified but otherwise normal adnexa without massess or free fluid, but a fluid filled structure distal to vaginal cuff. On review of images, this is a simple fluid without evidence of abscess or blood clot. She will follow up with OBGYN Dr Leafy Ro as an outpatient for thorough vaginal exam.  5. Hypokalemia: This will be repleted.  6. Hypothyroidism: Continue Synthroid  7. History of orthostatic hypotension: Continue Midodrine  8. History of previous CVA: Continue aspirin, Plavix and statin  9. Hyperlipidemia: Continue statin  10. Depression: Continue Zoloft  11. Mild/Moderate dementia as per granddaughter  109. Hypernatremia: Will repeat in a.m. after discontinuing IV fluids.  Management plans discussed with the patient she is in  agreement.   CODE STATUS: FULL  TOTAL TIME TAKING CARE OF THIS PATIENT: 24 minutes.     POSSIBLE D/C tomorrow to SNF, DEPENDING ON CLINICAL CONDITION.   Serenna Deroy M.D on 04/19/2017 at 8:08 AM  Between 7am to 6pm - Pager - [phone] After 6pm go to [email or web address] - password [email or web address] [facility]  Office  [phone]  CC: Primary care physician; [name], DO  Note: This dictation was prepared with Dragon dictation along with smaller phrase technology. Any transcriptional errors that result from this process are unintentional.

## 2017-04-19 NOTE — Progress Notes (Signed)
Magdalene, NP notified that glucose elevated on bmp and potassium 3.1. New orders to be placed per NP. Wilnette Kales

## 2017-04-19 NOTE — Progress Notes (Signed)
Patient's insulin gtt d/c'd per [name], NP and lantus given.  [name] [name]

## 2017-04-19 NOTE — Progress Notes (Signed)
Pharmacy Electrolyte Note  83 y/o F admitted with acute encephalopathy and DKA. Pharmacy consulted to monitor and replace electrolytes with patient previously on insulin drip.   Potassium (mmol/L)  Date Value  04/19/2017 3.1 (L)  12/12/2013 4.0   Magnesium (mg/dL)  Date Value  01/13/2017 1.6 (L)   Phosphorus (mg/dL)  Date Value  01/13/2017 3.4   Calcium (mg/dL)  Date Value  04/19/2017 9.9   Calcium, Total (mg/dL)  Date Value  12/12/2013 8.9   Albumin (g/dL)  Date Value  04/15/2017 4.6  12/12/2013 3.3 (L)   Potassium replaced with 60 meq orally. Next BMET at 1700. Will add magnesium to next labs.   Ulice Dash, PharmD Clinical Pharmacist

## 2017-04-19 NOTE — Progress Notes (Signed)
Nutrition Follow-up  DOCUMENTATION CODES:   Severe malnutrition in context of chronic illness  INTERVENTION:  Continue Magic cup TID with meals, each supplement provides 290 kcal and 9 grams of protein.  Continue daily multivitamin with minerals.  NUTRITION DIAGNOSIS:   Malnutrition (Severe) related to chronic illness (COPD, advanced age) as evidenced by severe depletion of body fat, moderate depletions of muscle mass, severe depletion of muscle mass.  Ongoing.  GOAL:   Patient will meet greater than or equal to 90% of their needs  Progressing.  MONITOR:   PO intake, Supplement acceptance, Diet advancement, Labs, Weight trends, Skin, I & O's  REASON FOR ASSESSMENT:   Malnutrition Screening Tool, Low Braden, Consult Assessment of nutrition requirement/status  ASSESSMENT:   83 year old female with PMHx of DM type 2, hx of breast cancer, OSA, osteoporosis, hypothyroidism, HLD, CAD, vitamin D deficiency, esophageal reflux, hx of MI, COPD, IBS, hx of CVA who is admitted s/p fall at [facility] and found to have DKA, acute renal failure, acute encephalopathy.   -Following SLP evaluation on 9/21 diet was advanced to dysphagia 2 with nectar-thick liquids.  RD received consult for assessment of nutrition requirements/status. Already following patient in setting of Malnutrition Screening Tool (MST) and low Braden score.  Met with patient at bedside. No family members present at this time. Patient more alert and able to answer some questions, though she still seems confused. She reports her appetite is coming back. Meal completion not recorded in chart. She cannot remember how she ate for lunch and dinner yesterday, but reports the food was "good." Patient had finished her nectar-thick milk, orange Magic Cup, and most of her oatmeal for breakfast today. She denies any N/V, abdominal pain.  Medications reviewed and include: Novolog 0-20 units Q4hrs, Lantus 15 units QHS,  levothyroxine, MVI daily, pantoprazole, Zoloft, LR @ 125 ml/hr.  Labs reviewed: CBG 127->600 past 24 hrs, Sodium 149, Potassium 3.1, Chloride 114, Bun 40, Creatinine 1.45. On 9/22 at 0455 serum glucose was 229, but increased to 751 at 1448 and 795 at 1653. Required transfer back to CCU for management.  No subsequent weights from admission to trend.  Discussed with RN.  Diet Order:  DIET DYS 2 Room service appropriate? Yes with Assist; Fluid consistency: Nectar Thick  Skin:  Reviewed, no issues  Last BM:  Unknown  Height:   Ht Readings from Last 1 Encounters:  04/15/17 5' 6"  (1.676 m)    Weight:   Wt Readings from Last 1 Encounters:  04/15/17 132 lb 15 oz (60.3 kg)    Ideal Body Weight:  59.1 kg  BMI:  Body mass index is 21.46 kg/m.  Estimated Nutritional Needs:   Kcal:  1500-1800 (25-30 kcal/kg)  Protein:  80-90 grams (1.3-1.5 grams/kg)  Fluid:  1.5 L/day (25 ml/kg)  EDUCATION NEEDS:   Education needs no appropriate at this time  Willey Blade, MS, RD, LDN Pager: [phone] After Hours Pager: [phone]

## 2017-04-19 NOTE — Progress Notes (Signed)
Pharmacy Electrolyte Note  83 y/o F admitted with acute encephalopathy and DKA. Pharmacy consulted to monitor and replace electrolytes with patient previously on insulin drip.   Potassium (mmol/L)  Date Value  04/19/2017 4.2  12/12/2013 4.0   Magnesium (mg/dL)  Date Value  04/19/2017 2.1   Phosphorus (mg/dL)  Date Value  01/13/2017 3.4   Calcium (mg/dL)  Date Value  04/19/2017 9.7   Calcium, Total (mg/dL)  Date Value  12/12/2013 8.9   Albumin (g/dL)  Date Value  04/15/2017 4.6  12/12/2013 3.3 (L)   Potassium replaced with 60 meq orally. Next BMET at 1700. Will add magnesium to next labs.   9/23 @ 17:30 :   K = 4.2,  Mag = 2.1 .    No additional supplementation needed at this time.  Will recheck electrolytes on 9/24 with AM labs.   Magnolia D Clinical Pharmacist

## 2017-04-20 LAB — GLUCOSE, CAPILLARY
GLUCOSE-CAPILLARY: 37 mg/dL — AB (ref 65–99)
GLUCOSE-CAPILLARY: 130 mg/dL — AB (ref 65–99)
GLUCOSE-CAPILLARY: 190 mg/dL — AB (ref 65–99)
GLUCOSE-CAPILLARY: 147 mg/dL — AB (ref 65–99)
GLUCOSE-CAPILLARY: 135 mg/dL — AB (ref 65–99)
Glucose-Capillary: 143 mg/dL — ABNORMAL HIGH (ref 65–99)
Glucose-Capillary: 194 mg/dL — ABNORMAL HIGH (ref 65–99)
Glucose-Capillary: 217 mg/dL — ABNORMAL HIGH (ref 65–99)
Glucose-Capillary: 263 mg/dL — ABNORMAL HIGH (ref 65–99)
Glucose-Capillary: 333 mg/dL — ABNORMAL HIGH (ref 65–99)
Glucose-Capillary: 74 mg/dL (ref 65–99)
Glucose-Capillary: >600 mg/dL (ref 65–99)

## 2017-04-20 LAB — BASIC METABOLIC PANEL
ANION GAP: 5 (ref 5–15)
BUN: 31 mg/dL — AB (ref 6–20)
CALCIUM: 9.3 mg/dL (ref 8.9–10.3)
CO2: 27 mmol/L (ref 22–32)
CREATININE: 1.21 mg/dL — AB (ref 0.44–1.00)
Chloride: 117 mmol/L — ABNORMAL HIGH (ref 101–111)
GFR calc Af Amer: 47 mL/min — ABNORMAL LOW (ref >60)
GFR, EST NON AFRICAN AMERICAN: 40 mL/min — AB (ref >60)
GLUCOSE: 114 mg/dL — AB (ref 65–99)
Potassium: 4.2 mmol/L (ref 3.5–5.1)
Sodium: 149 mmol/L — ABNORMAL HIGH (ref 135–145)

## 2017-04-20 LAB — CBC
HCT: 30.8 % — ABNORMAL LOW (ref 35.0–47.0)
HEMOGLOBIN: 10.5 g/dL — AB (ref 12.0–16.0)
MCH: 32.5 pg (ref 26.0–34.0)
MCHC: 34 g/dL (ref 32.0–36.0)
MCV: 95.6 fL (ref 80.0–100.0)
Platelets: 232 10*3/uL (ref 150–440)
RBC: 3.22 MIL/uL — ABNORMAL LOW (ref 3.80–5.20)
RDW: 16.5 % — AB (ref 11.5–14.5)
WBC: 8.3 10*3/uL (ref 3.6–11.0)

## 2017-04-20 LAB — PHOSPHORUS: PHOSPHORUS: 2.7 mg/dL (ref 2.5–4.6)

## 2017-04-20 LAB — MAGNESIUM: MAGNESIUM: 2 mg/dL (ref 1.7–2.4)

## 2017-04-20 MED ORDER — DEXTROSE 50 % IV SOLN
50.0000 mL | INTRAVENOUS | Status: DC | PRN
  Administered 2017-04-20: 50 mL via INTRAVENOUS
  Filled 2017-04-20: qty 50

## 2017-04-20 MED ORDER — INSULIN ASPART 100 UNIT/ML ~~LOC~~ SOLN
0.0000 [IU] | SUBCUTANEOUS | Status: DC
  Administered 2017-04-20: 1 [IU] via SUBCUTANEOUS
  Administered 2017-04-20: 2 [IU] via SUBCUTANEOUS
  Administered 2017-04-20: 7 [IU] via SUBCUTANEOUS
  Administered 2017-04-20: 5 [IU] via SUBCUTANEOUS
  Filled 2017-04-20 (×4): qty 1

## 2017-04-20 MED ORDER — SODIUM CHLORIDE 0.45 % IV SOLN
INTRAVENOUS | Status: DC
  Administered 2017-04-20: 17:00:00 via INTRAVENOUS

## 2017-04-20 MED ORDER — PANTOPRAZOLE SODIUM 40 MG PO PACK
40.0000 mg | PACK | Freq: Two times a day (BID) | ORAL | Status: DC
  Administered 2017-04-20: 40 mg via ORAL
  Filled 2017-04-20 (×3): qty 20

## 2017-04-20 MED ORDER — DEXTROSE 50 % IV SOLN
50.0000 mL | Freq: Once | INTRAVENOUS | Status: AC
  Administered 2017-04-20: 50 mL via INTRAVENOUS

## 2017-04-20 NOTE — Progress Notes (Addendum)
[place] at [place] NAME: [name] [name]    MR#:  [id number]  DATE OF BIRTH:  [birth year]  SUBJECTIVE:   She if off insulin drip. She is transitioned to subcutaneous insulin and sliding scale. REVIEW OF SYSTEMS:    Unable to obtain due to dementia Tolerating Diet:yes  DRUG ALLERGIES:   Allergies  Allergen Reactions  . Penicillins Rash    Has patient had a PCN reaction causing immediate rash, facial/tongue/throat swelling, SOB or lightheadedness with hypotension: No Has patient had a PCN reaction causing severe rash involving mucus membranes or skin necrosis: No Has patient had a PCN reaction that required hospitalization No Has patient had a PCN reaction occurring within the last 10 years: No If all of the above answers are "NO", then may proceed with Cephalosporin use.     VITALS:  Blood pressure 120/70, pulse 77, temperature 97.9 F (36.6 C), temperature source Axillary, resp. rate 17, height 5\' 6"  (1.676 m), weight 132 lb 15 oz (60.3 kg), SpO2 98 %.  PHYSICAL EXAMINATION:  Constitutional: Appears well-developed and well-nourished. No distress. HENT: Normocephalic. Marland Kitchen Oropharynx is clear and moist.  Eyes: Conjunctivae and EOM are normal. PERRLA, no scleral icterus.  Neck: Normal ROM. Neck supple. No JVD. No tracheal deviation. CVS: RRR, S1/S2 +, no murmurs, no gallops, no carotid bruit.  Pulmonary: Effort and breath sounds normal, no stridor, rhonchi, wheezes, rales.  Abdominal: Soft. BS +,  no distension, tenderness, rebound or guarding.  Musculoskeletal: Normal range of motion. No edema and no tenderness.  Neuro: oriented to  Name Not place or time Skin: Skin is warm and dry. No rash noted. Psychiatric: flat affect    LABORATORY PANEL:   CBC  Recent Labs Lab 04/20/17 0202  WBC 8.3  HGB 10.5*  HCT 30.8*  PLT 232    ------------------------------------------------------------------------------------------------------------------  Chemistries   Recent Labs Lab 04/15/17 1748  04/20/17 0202  NA 137  < > 149*  K 5.5*  < > 4.2  CL 99*  < > 117*  CO2 15*  < > 27  GLUCOSE 720*  < > 114*  BUN 43*  < > 31*  CREATININE 2.01*  < > 1.21*  CALCIUM 9.7  < > 9.3  MG  --   < > 2.0  AST 24  --   --   ALT 22  --   --   ALKPHOS 115  --   --   BILITOT 2.1*  --   --   < > = values in this interval not displayed. ------------------------------------------------------------------------------------------------------------------  Cardiac Enzymes  Recent Labs Lab 04/15/17 1748  TROPONINI <0.03   ------------------------------------------------------------------------------------------------------------------  RADIOLOGY:  No results found.   ASSESSMENT AND PLAN:   83 year old female with a history of diabetes, COPD, CVA and mild dementia who presents from skilled nursing facility with altered mental status.   1. Acute metabolic encephalopathy in the setting of diabetic ketoacidosis  Patient's mental status has improved at baseline MRI did not show CVA  2. DKA: This hasresolved On lantus 15 units HS. Continue sliding scale.   3. Acute kidney injury in the setting of dehydration and DKA: Creatinine is stable. She will be off of metformin for now until follow up with PCP  4. Vaginal bleeding: She was evaluated by OBGYN and underwent an abdominal pelvic ultrasound which shows no ovarian tissue identified but otherwise normal adnexa without massess or free fluid, but a fluid filled structure distal to vaginal cuff.  On review of images, this is a simple fluid without evidence of abscess or blood clot. She will follow up with OBGYN Dr Leafy Ro as an outpatient for thorough vaginal exam.  5. Hypokalemia: improved with supplement.  6. Hypothyroidism: Continue Synthroid  7. History of  orthostatic hypotension: Continue Midodrine  8. History of previous CVA: Continue aspirin, Plavix and statin  9. Hyperlipidemia: Continue statin  10. Depression: Continue Zoloft  11. Mild/Moderate dementia as per granddaughter  42. Hypernatremia: start half normal saline IV and follow-up BMP.  Management plans discussed with the patient's granddaughter, and she is in agreement.   CODE STATUS: FULL  TOTAL TIME TAKING CARE OF THIS PATIENT: 35 minutes.   POSSIBLE D/C tomorrow to SNF, DEPENDING ON CLINICAL CONDITION.   Demetrios Loll M.D on 04/20/2017 at 5:16 PM  Between 7am to 6pm - Pager - [phone] After 6pm go to [email or web address] - password [email or web address] [facility]  Office  [phone]  CC: Primary care physician; [name], DO  Note: This dictation was prepared with Dragon dictation along with smaller phrase technology. Any transcriptional errors that result from this process are unintentional.

## 2017-04-20 NOTE — Progress Notes (Addendum)
Inpatient Diabetes Program Recommendations  AACE/ADA: New Consensus Statement on Inpatient Glycemic Control (2015)  Target Ranges:  Prepandial:   less than 140 mg/dL      Peak postprandial:   less than 180 mg/dL (1-2 hours)      Critically ill patients:  140 - 180 mg/dL   Results for [name] [name], [name] [name] (MRN [id number]) as of 04/20/2017 12:00  Ref. Range 04/18/2017 23:33 04/19/2017 03:56 04/19/2017 08:02 04/19/2017 12:01 04/19/2017 16:24 04/19/2017 16:27 04/19/2017 19:49 04/19/2017 22:06  Glucose-Capillary Latest Ref Range: 65 - 99 mg/dL 127 (H)  10 units Lantus 299 (H)  8 units Novolog 224 (H)  7 units Novolog +  10 units Lantus 377 (H)  20 units Novolog 54 (L) 51 (L) 184 (H)  4 units Novolog 136 (H)  15 units Lantus   Results for [name] [name], [name] [name] (MRN [id number]) as of 04/20/2017 12:00  Ref. Range 04/19/2017 23:54 04/20/2017 01:07 04/20/2017 01:09 04/20/2017 01:38 04/20/2017 01:58 04/20/2017 03:04 04/20/2017 03:57 04/20/2017 06:15 04/20/2017 07:45 04/20/2017 11:40  Glucose-Capillary Latest Ref Range: 65 - 99 mg/dL 74 39 (LL) 37 (LL) 217 (H) 130 (H) 190 (H) 147 (H)  1 unit Novolog 135 (H) 143 (H) 194 (H)    Outpatient DM meds: Novolog 3 units TID               Novolog 0-9 units TID    Lantus 15 units daily        Metformin 500 mg BID  Current Orders: Lantus 15 units QHS      Novolog Sensitive Correction Scale/ SSI (0-9 units) Q4 hours     MD- Note patient transitioned off IV Insulin drip around Midnight on 09/23.  Received 10 units Lantus at 11pm to transition to SQ Insulin.  Received another 10 units Lantus at 8am yesterday.  Patient also received a large dose of Novolog yesterday at 12pm for CBG of 377 mg/dl (got 20 units Novolog).  CBG dropped to 54 mg/dl by 4pm last night and patient also Hypoglycemic at 1am (CBG 39 mg/dl) likely due to too much Novolog and Lantus.  Note that Lantus now 15 units QHS (home dose).  Please consider the following adjustments:  1. Change Novolog SSI to  TID AC + HS (currently ordered Q4 hours)  2. Start Novolog Meal Coverage: Novolog 3 units TID with meals (hold if pt eats <50% of meal)     --Will follow patient during hospitalization--  Wyn Quaker RN, MSN, CDE Diabetes Coordinator Inpatient Glycemic Control Team Team Pager: [phone] (8a-5p)

## 2017-04-20 NOTE — Clinical Social Work Note (Signed)
CSW extended bed offers to [facility] and she contacted [name] and they have chosen [facility]. CSW will continue to follow and facilitate discharge when time. Shela Leff MSW,LCSW [phone]

## 2017-04-20 NOTE — Progress Notes (Signed)
[place] for electrolyte monitoring  Allergies  Allergen Reactions  . Penicillins Rash    Has patient had a PCN reaction causing immediate rash, facial/tongue/throat swelling, SOB or lightheadedness with hypotension: No Has patient had a PCN reaction causing severe rash involving mucus membranes or skin necrosis: No Has patient had a PCN reaction that required hospitalization No Has patient had a PCN reaction occurring within the last 10 years: No If all of the above answers are "NO", then may proceed with Cephalosporin use.     Patient Measurements: Height: 5\' 6"  (167.6 cm) Weight: 132 lb 15 oz (60.3 kg) IBW/kg (Calculated) : 59.3  Vital Signs: Temp: 97.5 F (36.4 C) (09/24 0400) Temp Source: Oral (09/24 0400) BP: 105/66 (09/24 0600) Pulse Rate: 61 (09/24 0600) Intake/Output from previous day: 09/23 0701 - 09/24 0700 In: 2718.8 [I.V.:2718.8] Out: -  Intake/Output from this shift: No intake/output data recorded.  Labs:  Recent Labs  04/19/17 0458 04/19/17 1727 04/20/17 0202  WBC 6.6  --  8.3  HGB 12.0  --  10.5*  HCT 35.7  --  30.8*  PLT 311  --  232  CREATININE 1.45* 1.19* 1.21*  MG  --  2.1 2.0  PHOS  --   --  2.7   Estimated Creatinine Clearance: 33 mL/min (A) (by C-G formula based on SCr of 1.21 mg/dL (H)).   Microbiology: Recent Results (from the past 720 hour(s))  MRSA PCR Screening     Status: None   Collection Time: 04/15/17  9:25 PM  Result Value Ref Range Status   MRSA by PCR NEGATIVE NEGATIVE Final    Comment:        The GeneXpert MRSA Assay (FDA approved for NASAL specimens only), is one component of a comprehensive MRSA colonization surveillance program. It is not intended to diagnose MRSA infection nor to guide or monitor treatment for MRSA infections.     Medical History: Past Medical History:  Diagnosis Date  . Anxiety state, unspecified   . CAD (coronary artery disease)   . Cancer  [facility])    2004 Right breast, found on mammogram, radiation therapy, Dr. Bryson Ha, uterine cancer,   . COPD (chronic obstructive pulmonary disease) ([place])   . Diabetes mellitus   . Esophageal reflux   . Heart burn   . Hyperlipidemia   . IBS (irritable bowel syndrome)   . Myocardial infarction [facility])    Cath negative except for 40% occlusion LAD.  Pt not candidate for betablocker or ACEI because of hypotension  . OSA (obstructive sleep apnea)   . Osteoporosis 02/21/09   DEXA scan showed osteoporosis with left femur T-score -2.8.  Marland Kitchen Presbyacusis   . Stroke ([place])   . Thyroid disease    Hypothyroid  . Vitamin D deficiency     Medications:  9/23 Potassium chloride 60 mEq PO x1   Assessment: VW is an 29 YOF with known history of coronary artery disease, diabetes, COPD, history of breast cancer, HLD, history of previous MI, obstructive sleep apnea, dementia, osteoporosis and history of previous CVA who presented to the hospital on 04/15/17 due to altered mental status. Patient was noted to be significantly hyperglycemic and noted to be in acute DKA with AKI.   Plan:  No electrolyte supplementation warranted at this time.  Will continue to monitor with am labs.   Pharmacy will continue to follow per consult.   Durwin Reges, PharmD Student 04/20/2017,11:17 AM

## 2017-04-20 NOTE — Progress Notes (Signed)
* [facility]        Assessment and Plan:  Acute Diabetic Ketoacidosis-resolved   Returned to ICU with hyperglycemia, improving.  Acute kidney injury related to dehydration secondary to DKA Pseudohyperkalemia-resolved Vaginal Bleeding  Hx: Hypothyroidism, CVA, Depression, Hyperlipidemia  P: Transitioned off insulin gtt Continue scheduled Novolog, SSI, and Lantus Diabetes Coordinator and OBGYN consulted appreciate input  Continue Aspirin ,Plavix and Synthroid Continue Atorvastatin Continue Zoloft Ok to transfer to gen med floor          Date: 04/20/2017  MRN# [id number] [name] [name] 1931-12-03   [name] [name] is a 83 y.o. old female seen in follow up for chief complaint of  Chief Complaint  Patient presents with  . Fall     HPI:   Had episodic low BS,started on d5 infusion SSI adjusted Ok to transfer to floor Diabetes coordinator assessment pending Alert and awake   Medication:    Current Facility-Administered Medications:  .  acetaminophen (TYLENOL) tablet 650 mg, 650 mg, Oral, Q6H PRN **OR** acetaminophen (TYLENOL) suppository 650 mg, 650 mg, Rectal, Q6H PRN, Sainani, Vivek J, MD .  aspirin EC tablet 81 mg, 81 mg, Oral, Daily, Henreitta Leber, MD, 81 mg at 04/19/17 0821 .  atorvastatin (LIPITOR) tablet 80 mg, 80 mg, Oral, Daily, Henreitta Leber, MD, 80 mg at 04/19/17 0820 .  chlorhexidine (PERIDEX) 0.12 % solution 15 mL, 15 mL, Mouth Rinse, BID, Varughese, Bincy S, NP, 15 mL at 04/19/17 2249 .  clopidogrel (PLAVIX) tablet 75 mg, 75 mg, Oral, Daily, Henreitta Leber, MD, 75 mg at 04/19/17 0820 .  dextrose 50 % solution 50 mL, 50 mL, Intravenous, PRN, Tukov, Magadalene S, NP, 50 mL at 04/20/17 0119 .  heparin injection 5,000 Units, 5,000 Units, Subcutaneous, Q8H, Henreitta Leber, MD, 5,000 Units at 04/20/17 0515 .  insulin aspart (novoLOG) injection 0-9 Units, 0-9 Units, Subcutaneous, Q4H, Jakaden Ouzts, MD .  insulin glargine  (LANTUS) injection 15 Units, 15 Units, Subcutaneous, QHS, Tukov, Magadalene S, NP, 15 Units at 04/19/17 2249 .  lactated ringers infusion, , Intravenous, Continuous, Tukov, Magadalene S, NP, Last Rate: 125 mL/hr at 04/20/17 0600 .  levothyroxine (SYNTHROID, LEVOTHROID) tablet 100 mcg, 100 mcg, Oral, QAC breakfast, Henreitta Leber, MD, 100 mcg at 04/19/17 2353 .  MEDLINE mouth rinse, 15 mL, Mouth Rinse, q12n4p, Varughese, Bincy S, NP, 15 mL at 04/19/17 1600 .  midodrine (PROAMATINE) tablet 10 mg, 10 mg, Oral, TID WC, Sainani, Belia Heman, MD, 10 mg at 04/19/17 1704 .  morphine 2 MG/ML injection 0.5-1 mg, 0.5-1 mg, Intravenous, Q4H PRN, Awilda Bill, NP .  multivitamin with minerals tablet 1 tablet, 1 tablet, Oral, Daily, Mody, Sital, MD, 1 tablet at 04/19/17 0820 .  [DISCONTINUED] ondansetron (ZOFRAN) tablet 4 mg, 4 mg, Oral, Q6H PRN **OR** ondansetron (ZOFRAN) injection 4 mg, 4 mg, Intravenous, Q6H PRN, Henreitta Leber, MD, 4 mg at 04/19/17 1643 .  pantoprazole (PROTONIX) injection 40 mg, 40 mg, Intravenous, Q12H, Tukov, Magadalene S, NP, 40 mg at 04/19/17 2249 .  sertraline (ZOLOFT) tablet 25 mg, 25 mg, Oral, QPC breakfast, Sainani, Vivek J, MD, 25 mg at 04/19/17 0820   Allergies:  Penicillins  Review of Systems: Other:  All other systems were reviewed and found to be negative other than what is mentioned in the HPI.   Physical Examination:   VS: BP 105/66   Pulse 61   Temp (!) 97.5 F (36.4 C) (Oral)   Resp  11   Ht 5\' 6"  (1.676 m)   Wt 132 lb 15 oz (60.3 kg)   SpO2 99%   BMI 21.46 kg/m    General Appearance: No distress  Neuro:without focal findings,  speech normal,  HEENT: PERRLA, EOM intact. Pulmonary: normal breath sounds, No wheezing.   CardiovascularNormal S1,S2.  No m/r/g.   Abdomen: Benign, Soft, non-tender. Renal:  No costovertebral tenderness  GU:  Not performed at this time. Endoc: No evident thyromegaly, no signs of acromegaly. Skin:   warm, no  rash. Extremities: normal, no cyanosis, clubbing.   LABORATORY PANEL:   CBC  Recent Labs Lab 04/20/17 0202  WBC 8.3  HGB 10.5*  HCT 30.8*  PLT 232   ------------------------------------------------------------------------------------------------------------------  Chemistries   Recent Labs Lab 04/15/17 1748  04/20/17 0202  NA 137  < > 149*  K 5.5*  < > 4.2  CL 99*  < > 117*  CO2 15*  < > 27  GLUCOSE 720*  < > 114*  BUN 43*  < > 31*  CREATININE 2.01*  < > 1.21*  CALCIUM 9.7  < > 9.3  MG  --   < > 2.0  AST 24  --   --   ALT 22  --   --   ALKPHOS 115  --   --   BILITOT 2.1*  --   --   < > = values in this interval not displayed. ------------------------------------------------------------------------------------------------------------------  Cardiac Enzymes  Recent Labs Lab 04/15/17 1748  TROPONINI <0.03   ------------------------------------------------------------    Corrin Parker, M.D.  [facility]  Medical Director [place] Director [place] Department

## 2017-04-20 NOTE — Progress Notes (Signed)
[name] Mane, NP notified of CBG result of 74. Asked about fluid change. Received order to recheck CBG in 1 hour.

## 2017-04-21 ENCOUNTER — Other Ambulatory Visit: Payer: Self-pay | Admitting: *Deleted

## 2017-04-21 DIAGNOSIS — M25531 Pain in right wrist: Secondary | ICD-10-CM | POA: Diagnosis not present

## 2017-04-21 DIAGNOSIS — I4891 Unspecified atrial fibrillation: Secondary | ICD-10-CM | POA: Diagnosis not present

## 2017-04-21 DIAGNOSIS — Z923 Personal history of irradiation: Secondary | ICD-10-CM | POA: Diagnosis not present

## 2017-04-21 DIAGNOSIS — S52251D Displaced comminuted fracture of shaft of ulna, right arm, subsequent encounter for closed fracture with routine healing: Secondary | ICD-10-CM | POA: Diagnosis not present

## 2017-04-21 DIAGNOSIS — K219 Gastro-esophageal reflux disease without esophagitis: Secondary | ICD-10-CM | POA: Diagnosis not present

## 2017-04-21 DIAGNOSIS — Z79899 Other long term (current) drug therapy: Secondary | ICD-10-CM | POA: Diagnosis not present

## 2017-04-21 DIAGNOSIS — S62109A Fracture of unspecified carpal bone, unspecified wrist, initial encounter for closed fracture: Secondary | ICD-10-CM | POA: Diagnosis not present

## 2017-04-21 DIAGNOSIS — S52571A Other intraarticular fracture of lower end of right radius, initial encounter for closed fracture: Secondary | ICD-10-CM | POA: Diagnosis not present

## 2017-04-21 DIAGNOSIS — E119 Type 2 diabetes mellitus without complications: Secondary | ICD-10-CM | POA: Diagnosis not present

## 2017-04-21 DIAGNOSIS — R1084 Generalized abdominal pain: Secondary | ICD-10-CM | POA: Diagnosis not present

## 2017-04-21 DIAGNOSIS — Z8673 Personal history of transient ischemic attack (TIA), and cerebral infarction without residual deficits: Secondary | ICD-10-CM | POA: Diagnosis not present

## 2017-04-21 DIAGNOSIS — Z7902 Long term (current) use of antithrombotics/antiplatelets: Secondary | ICD-10-CM | POA: Diagnosis not present

## 2017-04-21 DIAGNOSIS — J449 Chronic obstructive pulmonary disease, unspecified: Secondary | ICD-10-CM | POA: Diagnosis not present

## 2017-04-21 DIAGNOSIS — E1165 Type 2 diabetes mellitus with hyperglycemia: Secondary | ICD-10-CM | POA: Diagnosis not present

## 2017-04-21 DIAGNOSIS — E039 Hypothyroidism, unspecified: Secondary | ICD-10-CM | POA: Diagnosis not present

## 2017-04-21 DIAGNOSIS — I2510 Atherosclerotic heart disease of native coronary artery without angina pectoris: Secondary | ICD-10-CM | POA: Diagnosis not present

## 2017-04-21 DIAGNOSIS — W19XXXD Unspecified fall, subsequent encounter: Secondary | ICD-10-CM | POA: Diagnosis not present

## 2017-04-21 DIAGNOSIS — Z9181 History of falling: Secondary | ICD-10-CM | POA: Diagnosis not present

## 2017-04-21 DIAGNOSIS — N183 Chronic kidney disease, stage 3 (moderate): Secondary | ICD-10-CM | POA: Diagnosis not present

## 2017-04-21 DIAGNOSIS — Z7982 Long term (current) use of aspirin: Secondary | ICD-10-CM | POA: Diagnosis not present

## 2017-04-21 DIAGNOSIS — Z8542 Personal history of malignant neoplasm of other parts of uterus: Secondary | ICD-10-CM | POA: Diagnosis not present

## 2017-04-21 DIAGNOSIS — W19XXXA Unspecified fall, initial encounter: Secondary | ICD-10-CM | POA: Diagnosis not present

## 2017-04-21 DIAGNOSIS — I10 Essential (primary) hypertension: Secondary | ICD-10-CM | POA: Diagnosis not present

## 2017-04-21 DIAGNOSIS — Z743 Need for continuous supervision: Secondary | ICD-10-CM | POA: Diagnosis not present

## 2017-04-21 DIAGNOSIS — Z7401 Bed confinement status: Secondary | ICD-10-CM | POA: Diagnosis not present

## 2017-04-21 DIAGNOSIS — S52121A Displaced fracture of head of right radius, initial encounter for closed fracture: Secondary | ICD-10-CM | POA: Diagnosis not present

## 2017-04-21 DIAGNOSIS — E1122 Type 2 diabetes mellitus with diabetic chronic kidney disease: Secondary | ICD-10-CM | POA: Diagnosis not present

## 2017-04-21 DIAGNOSIS — K589 Irritable bowel syndrome without diarrhea: Secondary | ICD-10-CM | POA: Diagnosis not present

## 2017-04-21 DIAGNOSIS — F0390 Unspecified dementia without behavioral disturbance: Secondary | ICD-10-CM | POA: Diagnosis not present

## 2017-04-21 DIAGNOSIS — S52501A Unspecified fracture of the lower end of right radius, initial encounter for closed fracture: Secondary | ICD-10-CM | POA: Diagnosis not present

## 2017-04-21 DIAGNOSIS — I739 Peripheral vascular disease, unspecified: Secondary | ICD-10-CM | POA: Diagnosis not present

## 2017-04-21 DIAGNOSIS — Y939 Activity, unspecified: Secondary | ICD-10-CM | POA: Diagnosis not present

## 2017-04-21 DIAGNOSIS — G9340 Encephalopathy, unspecified: Secondary | ICD-10-CM | POA: Diagnosis not present

## 2017-04-21 DIAGNOSIS — M79601 Pain in right arm: Secondary | ICD-10-CM | POA: Diagnosis not present

## 2017-04-21 DIAGNOSIS — I129 Hypertensive chronic kidney disease with stage 1 through stage 4 chronic kidney disease, or unspecified chronic kidney disease: Secondary | ICD-10-CM | POA: Diagnosis not present

## 2017-04-21 DIAGNOSIS — E559 Vitamin D deficiency, unspecified: Secondary | ICD-10-CM | POA: Diagnosis not present

## 2017-04-21 DIAGNOSIS — E876 Hypokalemia: Secondary | ICD-10-CM | POA: Diagnosis not present

## 2017-04-21 DIAGNOSIS — S199XXA Unspecified injury of neck, initial encounter: Secondary | ICD-10-CM | POA: Diagnosis not present

## 2017-04-21 DIAGNOSIS — E118 Type 2 diabetes mellitus with unspecified complications: Secondary | ICD-10-CM | POA: Diagnosis not present

## 2017-04-21 DIAGNOSIS — K5900 Constipation, unspecified: Secondary | ICD-10-CM | POA: Diagnosis not present

## 2017-04-21 DIAGNOSIS — Z853 Personal history of malignant neoplasm of breast: Secondary | ICD-10-CM | POA: Diagnosis not present

## 2017-04-21 DIAGNOSIS — F419 Anxiety disorder, unspecified: Secondary | ICD-10-CM | POA: Diagnosis not present

## 2017-04-21 DIAGNOSIS — E1310 Other specified diabetes mellitus with ketoacidosis without coma: Secondary | ICD-10-CM | POA: Diagnosis not present

## 2017-04-21 DIAGNOSIS — Y999 Unspecified external cause status: Secondary | ICD-10-CM | POA: Diagnosis not present

## 2017-04-21 DIAGNOSIS — Z794 Long term (current) use of insulin: Secondary | ICD-10-CM | POA: Diagnosis not present

## 2017-04-21 DIAGNOSIS — E1151 Type 2 diabetes mellitus with diabetic peripheral angiopathy without gangrene: Secondary | ICD-10-CM | POA: Diagnosis not present

## 2017-04-21 DIAGNOSIS — E785 Hyperlipidemia, unspecified: Secondary | ICD-10-CM | POA: Diagnosis not present

## 2017-04-21 DIAGNOSIS — R109 Unspecified abdominal pain: Secondary | ICD-10-CM | POA: Diagnosis not present

## 2017-04-21 DIAGNOSIS — S0990XA Unspecified injury of head, initial encounter: Secondary | ICD-10-CM | POA: Diagnosis not present

## 2017-04-21 DIAGNOSIS — S0083XA Contusion of other part of head, initial encounter: Secondary | ICD-10-CM | POA: Diagnosis not present

## 2017-04-21 DIAGNOSIS — I252 Old myocardial infarction: Secondary | ICD-10-CM | POA: Diagnosis not present

## 2017-04-21 DIAGNOSIS — M6281 Muscle weakness (generalized): Secondary | ICD-10-CM | POA: Diagnosis not present

## 2017-04-21 DIAGNOSIS — N179 Acute kidney failure, unspecified: Secondary | ICD-10-CM | POA: Diagnosis not present

## 2017-04-21 DIAGNOSIS — M816 Localized osteoporosis [Lequesne]: Secondary | ICD-10-CM | POA: Diagnosis not present

## 2017-04-21 DIAGNOSIS — G4730 Sleep apnea, unspecified: Secondary | ICD-10-CM | POA: Diagnosis not present

## 2017-04-21 DIAGNOSIS — D649 Anemia, unspecified: Secondary | ICD-10-CM | POA: Diagnosis not present

## 2017-04-21 DIAGNOSIS — S52601A Unspecified fracture of lower end of right ulna, initial encounter for closed fracture: Secondary | ICD-10-CM | POA: Diagnosis not present

## 2017-04-21 DIAGNOSIS — Y92129 Unspecified place in nursing home as the place of occurrence of the external cause: Secondary | ICD-10-CM | POA: Diagnosis not present

## 2017-04-21 DIAGNOSIS — E1110 Type 2 diabetes mellitus with ketoacidosis without coma: Secondary | ICD-10-CM | POA: Diagnosis not present

## 2017-04-21 DIAGNOSIS — S52351D Displaced comminuted fracture of shaft of radius, right arm, subsequent encounter for closed fracture with routine healing: Secondary | ICD-10-CM | POA: Diagnosis not present

## 2017-04-21 DIAGNOSIS — G4733 Obstructive sleep apnea (adult) (pediatric): Secondary | ICD-10-CM | POA: Diagnosis not present

## 2017-04-21 DIAGNOSIS — K5641 Fecal impaction: Secondary | ICD-10-CM | POA: Diagnosis not present

## 2017-04-21 DIAGNOSIS — Z7984 Long term (current) use of oral hypoglycemic drugs: Secondary | ICD-10-CM | POA: Diagnosis not present

## 2017-04-21 LAB — BASIC METABOLIC PANEL
ANION GAP: 6 (ref 5–15)
BUN: 19 mg/dL (ref 6–20)
CALCIUM: 8.8 mg/dL — AB (ref 8.9–10.3)
CHLORIDE: 107 mmol/L (ref 101–111)
CO2: 28 mmol/L (ref 22–32)
Creatinine, Ser: 1.07 mg/dL — ABNORMAL HIGH (ref 0.44–1.00)
GFR calc non Af Amer: 47 mL/min — ABNORMAL LOW (ref >60)
GFR, EST AFRICAN AMERICAN: 54 mL/min — AB (ref >60)
GLUCOSE: 63 mg/dL — AB (ref 65–99)
POTASSIUM: 3.7 mmol/L (ref 3.5–5.1)
Sodium: 141 mmol/L (ref 135–145)

## 2017-04-21 LAB — GLUCOSE, CAPILLARY
GLUCOSE-CAPILLARY: 39 mg/dL — AB (ref 65–99)
GLUCOSE-CAPILLARY: 57 mg/dL — AB (ref 65–99)
GLUCOSE-CAPILLARY: 226 mg/dL — AB (ref 65–99)
Glucose-Capillary: 66 mg/dL (ref 65–99)
Glucose-Capillary: 79 mg/dL (ref 65–99)
Glucose-Capillary: 106 mg/dL — ABNORMAL HIGH (ref 65–99)
Glucose-Capillary: 71 mg/dL (ref 65–99)

## 2017-04-21 MED ORDER — GLUCOSE 40 % PO GEL
ORAL | Status: AC
  Administered 2017-04-21: 37.5 g
  Filled 2017-04-21: qty 1

## 2017-04-21 NOTE — Care Management Important Message (Signed)
Important Message  Patient Details  Name: [name] [name] MRN: [id number] Date of Birth: [birth year]   Medicare Important Message Given:  Yes    Marshell Garfinkel, RN 04/21/2017, 7:42 AM

## 2017-04-21 NOTE — Care Management (Addendum)
Discharge order in place for SNF however telesitter still in place and patient requires to be sitter free for 24 hours. Avoidable days applied to payer/bed days. MD paged to cancel discharge. I spoke with Dr. Bridgett Larsson and he said that patient has been cleared by [facility] for discharge any way.  RNCM confirmed this with CSW.

## 2017-04-21 NOTE — Patient Outreach (Signed)
View in Epic pt went to  [facility] today  following recent hospitalization September 19 -25,2018 for Hypernatremia, Hyperglycemia, Acute kidney injury, Diabetic ketoacidosis with coma associated with type 2 Diabetes mellitus.  This RN CM to follow up with pt at discharge from [facility].   [facility]  [phone]

## 2017-04-21 NOTE — Clinical Social Work Placement (Signed)
   CLINICAL SOCIAL WORK PLACEMENT  NOTE  Date:  04/21/2017  Patient Details  Name: [name] [name] MRN: [id number] Date of Birth: 1931/09/28  Clinical Social Work is seeking post-discharge placement for this patient at the [place] level of care (*CSW will initial, date and re-position this form in  chart as items are completed):  Yes   Patient/family provided with [place] Work Department's list of facilities offering this level of care within the geographic area requested by the patient (or if unable, by the patient's family).  Yes   Patient/family informed of their freedom to choose among providers that offer the needed level of care, that participate in Medicare, Medicaid or managed care program needed by the patient, have an available bed and are willing to accept the patient.  Yes   Patient/family informed of [place]'s ownership interest in [facility] and [facility], as well as of the fact that they are under no obligation to receive care at these facilities.  PASRR submitted to EDS on       PASRR number received on       Existing PASRR number confirmed on 04/21/17 ([id number] )     FL2 transmitted to all facilities in geographic area requested by pt/family on 04/17/17     FL2 transmitted to all facilities within larger geographic area on       Patient informed that his/her managed care company has contracts with or will negotiate with certain facilities, including the following:        Yes   Patient/family informed of bed offers received.  Patient chooses bed at  [facility] )     Physician recommends and patient chooses bed at      Patient to be transferred to  [facility] ) on 04/21/17.  Patient to be transferred to facility by  [facility] )     Patient family notified on 04/21/17 of transfer.  Name of family member notified:   (Patient's son [name] is aware of D/C today. )     PHYSICIAN        Additional Comment:    _______________________________________________ Lelah Rennaker, Veronia Beets, LCSW 04/21/2017, 1:27 PM

## 2017-04-21 NOTE — Progress Notes (Signed)
Dr Marcille Blanco notified of low blood sugars. Received order to administer an amp of D50 if blood sugar is low when rechecked

## 2017-04-21 NOTE — Discharge Instructions (Signed)
Heart healthy and ADA diet. °Fall and aspiration precaution. °

## 2017-04-21 NOTE — Progress Notes (Signed)
CH made a follow up visit with pt. CH was lying in the bed at the time of this visit. Pt was alert and talking. Pt talked about her health and said she was doing well. Pt appeared weak, but calm. CH offered silent prayer and pastoral presence.    04/21/17 1100  Clinical Encounter Type  Visited With Patient  Visit Type Follow-up  Referral From Chaplain  Spiritual Encounters  Spiritual Needs Prayer;Emotional

## 2017-04-21 NOTE — Progress Notes (Signed)
[place] for electrolyte monitoring  Allergies  Allergen Reactions  . Penicillins Rash    Has patient had a PCN reaction causing immediate rash, facial/tongue/throat swelling, SOB or lightheadedness with hypotension: No Has patient had a PCN reaction causing severe rash involving mucus membranes or skin necrosis: No Has patient had a PCN reaction that required hospitalization No Has patient had a PCN reaction occurring within the last 10 years: No If all of the above answers are "NO", then may proceed with Cephalosporin use.     Labs:  Recent Labs  04/19/17 0458 04/19/17 1727 04/20/17 0202 04/21/17 0155  WBC 6.6  --  8.3  --   HGB 12.0  --  10.5*  --   HCT 35.7  --  30.8*  --   PLT 311  --  232  --   CREATININE 1.45* 1.19* 1.21* 1.07*  MG  --  2.1 2.0  --   PHOS  --   --  2.7  --    Estimated Creatinine Clearance: 34.4 mL/min (A) (by C-G formula based on SCr of 1.07 mg/dL (H)).  Assessment: Pharmacy consulted to monitor electrolytes and replace if needed.   Plan:  Electrolytes have been WNL x 3 days. Patient is no longer on insulin drip. Will sign off.  Lenis Noon, PharmD Clinical Pharmacist 04/21/2017,10:19 AM

## 2017-04-21 NOTE — Progress Notes (Signed)
Pt ready for discharge to [facility]. Pt will transport via EMS. Report called to RN at [facility]. Ems notified.

## 2017-04-21 NOTE — Progress Notes (Signed)
Per [facility] admissions coordinator at [facility] they require 1 week of co-pays up front ($1,120). Clinical Social Worker (CSW) contacted patient's granddaughter [name] and made her aware of above. Per [name] patient's son [name] handles patient's money and will make that decision. CSW contacted [name] and made him aware of above. Per [name] he can pay for the co-pays up front and spoke with Magda Paganini and made the payment over the telephone. CSW explained long term care options to [name] and how to apply for long term care medicaid.   Patient is medically stable for D/C to [facility] today. Per [facility] admissions coordinator at [facility] patient can come today to room 502. CSW made [facility] aware that patient has a tele-sitter today but it is being discontinued. Per Magda Paganini patient can still come to [facility] today. Patient's son [name] is aware of above. Patient's granddaughter [name] is aware of above. Please reconsult if future social work needs arise. CSW signing off.   [employer], LCSW [phone]

## 2017-04-21 NOTE — Clinical Social Work Placement (Signed)
   CLINICAL SOCIAL WORK PLACEMENT  NOTE  Date:  04/21/2017  Patient Details  Name: ALESANDRA SMART MRN: [id number] Date of Birth: November 11, 1931  Clinical Social Work is seeking post-discharge placement for this patient at the [place] level of care (*CSW will initial, date and re-position this form in  chart as items are completed):  Yes   Patient/family provided with [place] Work Department's list of facilities offering this level of care within the geographic area requested by the patient (or if unable, by the patient's family).  Yes   Patient/family informed of their freedom to choose among providers that offer the needed level of care, that participate in Medicare, Medicaid or managed care program needed by the patient, have an available bed and are willing to accept the patient.  Yes   Patient/family informed of [place]'s ownership interest in [facility] and [facility], as well as of the fact that they are under no obligation to receive care at these facilities.  PASRR submitted to EDS on       PASRR number received on       Existing PASRR number confirmed on 04/21/17 ([id number] )     FL2 transmitted to all facilities in geographic area requested by pt/family on 04/17/17     FL2 transmitted to all facilities within larger geographic area on       Patient informed that his/her managed care company has contracts with or will negotiate with certain facilities, including the following:        Yes   Patient/family informed of bed offers received.  Patient chooses bed at  [facility] )     Physician recommends and patient chooses bed at      Patient to be transferred to   on  .  Patient to be transferred to facility by       Patient family notified on   of transfer.  Name of family member notified:        PHYSICIAN       Additional Comment:    _______________________________________________ Raeana Blinn, Veronia Beets,  LCSW 04/21/2017, 9:03 AM

## 2017-04-21 NOTE — Discharge Summary (Signed)
[facility] NAME: [name] [name]    MR#:  [id number]  DATE OF BIRTH:  05-17-1932  DATE OF ADMISSION:  04/15/2017   ADMITTING PHYSICIAN: Henreitta Leber, MD  DATE OF DISCHARGE: 04/21/2017 PRIMARY CARE PHYSICIAN: [name], DO   ADMISSION DIAGNOSIS:  Hypernatremia [E87.0] Hyperglycemia [R73.9] Acute kidney injury ([place]) [N17.9] Diabetic ketoacidosis with coma associated with type 2 diabetes mellitus ([place]) [E11.11] DISCHARGE DIAGNOSIS:  Active Problems:   DKA (diabetic ketoacidoses) ([place])   Hyperglycemia   Acute kidney injury ([place])  SECONDARY DIAGNOSIS:   Past Medical History:  Diagnosis Date  . Anxiety state, unspecified   . CAD (coronary artery disease)   . Cancer [facility])    2004 Right breast, found on mammogram, radiation therapy, Dr. Bryson Ha, uterine cancer,   . COPD (chronic obstructive pulmonary disease) ([place])   . Diabetes mellitus   . Esophageal reflux   . Heart burn   . Hyperlipidemia   . IBS (irritable bowel syndrome)   . Myocardial infarction [facility])    Cath negative except for 40% occlusion LAD.  Pt not candidate for betablocker or ACEI because of hypotension  . OSA (obstructive sleep apnea)   . Osteoporosis 02/21/09   DEXA scan showed osteoporosis with left femur T-score -2.8.  Marland Kitchen Presbyacusis   . Stroke ([place])   . Thyroid disease    Hypothyroid  . Vitamin D deficiency    HOSPITAL COURSE:  83 year old female with a history of diabetes, COPD, CVA and mild dementia who presents from skilled nursing facility with altered mental status.   1. Acute metabolic encephalopathy in the setting of diabetic ketoacidosis  Patient's mental status has improved to baseline MRI did not show CVA  2. DKA: This hasresolved On lantus 15 units HS. Continue sliding scale.  3. Acute kidney injury in the setting of dehydration and DKA: Creatinine is stable. She will be off of metformin for now until follow up with  PCP  4. Vaginal bleeding: She was evaluated by OBGYN and underwent an abdominal pelvic ultrasound which shows no ovarian tissue identified but otherwise normal adnexa without massess or free fluid, but a fluid filled structure distal to vaginal cuff. On review of images, this is a simple fluid without evidence of abscess or blood clot. She will follow up with OBGYN Dr Leafy Ro as an outpatient for thorough vaginal exam.  5. Hypokalemia: improved with supplement.  6. Hypothyroidism: Continue Synthroid  7. History of orthostatic hypotension: Continue Midodrine  8. History of previous CVA: Continue aspirin, Plavix and statin  9. Hyperlipidemia: Continue statin  10. Depression: Continue Zoloft  11. Mild/Moderatedementia as per granddaughter  21. Hypernatremia: improved with half normal saline IV.  DISCHARGE CONDITIONS:  Stable, discharge to SNF today. CONSULTS OBTAINED:  Treatment Team:  Benjaman Kindler, MD DRUG ALLERGIES:   Allergies  Allergen Reactions  . Penicillins Rash    Has patient had a PCN reaction causing immediate rash, facial/tongue/throat swelling, SOB or lightheadedness with hypotension: No Has patient had a PCN reaction causing severe rash involving mucus membranes or skin necrosis: No Has patient had a PCN reaction that required hospitalization No Has patient had a PCN reaction occurring within the last 10 years: No If all of the above answers are "NO", then may proceed with Cephalosporin use.    DISCHARGE MEDICATIONS:   Allergies as of 04/21/2017      Reactions   Penicillins Rash   Has patient had a PCN reaction  causing immediate rash, facial/tongue/throat swelling, SOB or lightheadedness with hypotension: No Has patient had a PCN reaction causing severe rash involving mucus membranes or skin necrosis: No Has patient had a PCN reaction that required hospitalization No Has patient had a PCN reaction occurring within the last 10 years: No If all of  the above answers are "NO", then may proceed with Cephalosporin use.      Medication List    STOP taking these medications   metFORMIN 500 MG tablet Commonly known as:  GLUCOPHAGE     TAKE these medications   aspirin EC 81 MG tablet Take 1 tablet (81 mg total) by mouth daily.   atorvastatin 80 MG tablet Commonly known as:  LIPITOR Take 1 tablet (80 mg total) by mouth daily.   clopidogrel 75 MG tablet Commonly known as:  PLAVIX TAKE 1 TABLET(75 MG) BY MOUTH DAILY   fluticasone 50 MCG/ACT nasal spray Commonly known as:  FLONASE Place 2 sprays into both nostrils daily. What changed:  when to take this  reasons to take this   glucagon 1 MG injection Inject 1 mg into the vein once as needed.   insulin aspart 100 UNIT/ML injection Commonly known as:  novoLOG Inject 3 Units into the skin 3 (three) times daily with meals.   insulin aspart 100 UNIT/ML injection Commonly known as:  novoLOG Inject 0-5 Units into the skin at bedtime.   insulin aspart 100 UNIT/ML injection Commonly known as:  novoLOG Inject 0-9 Units into the skin 3 (three) times daily with meals.   insulin glargine 100 UNIT/ML injection Commonly known as:  LANTUS Inject 0.15 mLs (15 Units total) into the skin daily.   levothyroxine 100 MCG tablet Commonly known as:  SYNTHROID, LEVOTHROID TAKE 1 TABLET(100 MCG) BY MOUTH DAILY   loperamide 2 MG capsule Commonly known as:  IMODIUM Take 4 mg by mouth as needed for diarrhea or loose stools.   midodrine 10 MG tablet Commonly known as:  PROAMATINE Take 1 tablet (10 mg total) by mouth 3 (three) times daily with meals.   pantoprazole 40 MG tablet Commonly known as:  PROTONIX Take 1 tablet (40 mg total) by mouth 2 (two) times daily.   sertraline 25 MG tablet Commonly known as:  ZOLOFT Take 1 tablet (25 mg total) by mouth daily after breakfast.            Discharge Care Instructions        Start     Ordered   04/21/17 0000  Increase activity  slowly     04/21/17 1048   04/21/17 0000  Diet - low sodium heart healthy     04/21/17 1048   04/18/17 0000  Increase activity slowly     04/18/17 0751   04/18/17 0000  Discharge instructions    Comments:  DIET: DIET DYS 2 Room service appropriate? Yes with Assist; Fluid consistency: Nectar Thick  DISCHARGE CONDITION: Fair  ACTIVITY: Activity as tolerated  OXYGEN: Home Oxygen: No.   Oxygen Delivery: room air   If you experience worsening of your admission symptoms, develop shortness of breath, life threatening emergency, suicidal or homicidal thoughts you must seek medical attention immediately by calling 911 or calling your MD immediately  if symptoms less severe.  You Must read complete instructions/literature along with all the possible adverse reactions/side effects for all the Medicines you take and that have been prescribed to you. Take any new Medicines after you have completely understood and accept all the possible adverse  reactions/side effects.   Please note  You were cared for by a hospitalist during your hospital stay. If you have any questions about your discharge medications or the care you received while you were in the hospital after you are discharged, you can call the unit and asked to speak with the hospitalist on call if the hospitalist that took care of you is not available. Once you are discharged, your primary care physician will handle any further medical issues. Please note that NO REFILLS for any discharge medications will be authorized once you are discharged, as it is imperative that you return to your primary care physician (or establish a relationship with a primary care physician if you do not have one) for your aftercare needs so that they can reassess your need for medications and monitor your lab values.   I   04/18/17 0751   04/18/17 0000  Call MD for:  difficulty breathing, headache or visual disturbances     04/18/17 0751       DISCHARGE  INSTRUCTIONS:  See AVS.  If you experience worsening of your admission symptoms, develop shortness of breath, life threatening emergency, suicidal or homicidal thoughts you must seek medical attention immediately by calling 911 or calling your MD immediately  if symptoms less severe.  You Must read complete instructions/literature along with all the possible adverse reactions/side effects for all the Medicines you take and that have been prescribed to you. Take any new Medicines after you have completely understood and accpet all the possible adverse reactions/side effects.   Please note  You were cared for by a hospitalist during your hospital stay. If you have any questions about your discharge medications or the care you received while you were in the hospital after you are discharged, you can call the unit and asked to speak with the hospitalist on call if the hospitalist that took care of you is not available. Once you are discharged, your primary care physician will handle any further medical issues. Please note that NO REFILLS for any discharge medications will be authorized once you are discharged, as it is imperative that you return to your primary care physician (or establish a relationship with a primary care physician if you do not have one) for your aftercare needs so that they can reassess your need for medications and monitor your lab values.    On the day of Discharge:  VITAL SIGNS:  Blood pressure (!) 107/51, pulse 75, temperature 98.1 F (36.7 C), temperature source Oral, resp. rate 18, height 5\' 4"  (1.626 m), weight 131 lb 12.8 oz (59.8 kg), SpO2 97 %. PHYSICAL EXAMINATION:  GENERAL:  83 y.o.-year-old patient lying in the bed with no acute distress.  EYES: Pupils equal, round, reactive to light and accommodation. No scleral icterus. Extraocular muscles intact.  HEENT: Head atraumatic, normocephalic. Oropharynx and nasopharynx clear.  NECK:  Supple, no jugular venous  distention. No thyroid enlargement, no tenderness.  LUNGS: Normal breath sounds bilaterally, no wheezing, rales,rhonchi or crepitation. No use of accessory muscles of respiration.  CARDIOVASCULAR: S1, S2 normal. No murmurs, rubs, or gallops.  ABDOMEN: Soft, non-tender, non-distended. Bowel sounds present. No organomegaly or mass.  EXTREMITIES: No pedal edema, cyanosis, or clubbing.  NEUROLOGIC: Cranial nerves II through XII are intact. Muscle strength 4/5 in all extremities. Sensation intact. Gait not checked.  PSYCHIATRIC: The patient is demented. SKIN: No obvious rash, lesion, or ulcer.  DATA REVIEW:   CBC  Recent Labs Lab 04/20/17 0202  WBC 8.3  HGB 10.5*  HCT 30.8*  PLT 232    Chemistries   Recent Labs Lab 04/15/17 1748  04/20/17 0202 04/21/17 0155  NA 137  < > 149* 141  K 5.5*  < > 4.2 3.7  CL 99*  < > 117* 107  CO2 15*  < > 27 28  GLUCOSE 720*  < > 114* 63*  BUN 43*  < > 31* 19  CREATININE 2.01*  < > 1.21* 1.07*  CALCIUM 9.7  < > 9.3 8.8*  MG  --   < > 2.0  --   AST 24  --   --   --   ALT 22  --   --   --   ALKPHOS 115  --   --   --   BILITOT 2.1*  --   --   --   < > = values in this interval not displayed.   Microbiology Results  Results for orders placed or performed during the hospital encounter of 04/15/17  MRSA PCR Screening     Status: None   Collection Time: 04/15/17  9:25 PM  Result Value Ref Range Status   MRSA by PCR NEGATIVE NEGATIVE Final    Comment:        The GeneXpert MRSA Assay (FDA approved for NASAL specimens only), is one component of a comprehensive MRSA colonization surveillance program. It is not intended to diagnose MRSA infection nor to guide or monitor treatment for MRSA infections.     RADIOLOGY:  No results found.   Management plans discussed with the patient, family and they are in agreement.  CODE STATUS: Full Code   TOTAL TIME TAKING CARE OF THIS PATIENT: 33 minutes.    Demetrios Loll M.D on 04/21/2017 at 11:35  AM  Between 7am to 6pm - Pager - [phone]  After 6pm go to [email or web address] - Proofreader  [facility]  Office  [phone]  CC: Primary care physician; [name], DO   Note: This dictation was prepared with Dragon dictation along with smaller phrase technology. Any transcriptional errors that result from this process are unintentional.

## 2017-04-21 NOTE — Progress Notes (Addendum)
gluTose15 given for CBG of 57 MD Paged

## 2017-04-21 NOTE — Progress Notes (Signed)
Physical Therapy Treatment Patient Details Name: [name] [name] MRN: [id number] DOB: 10-06-1931 Today's Date: 04/21/2017    History of Present Illness [name] [name] is an 83yo white female who comes to [facility] on 9/19 c AMS found to be in DKA; SNF reports a fall 3d PTA. Pt has had at least 4 other admissions this year d/t AMS related to hyperglycemia and/or DKA. She has baseline dementia and limited mobility at baseline, often unable to perform meaningful distances of AMB with PT while admitted. PMH: CAD, D\M, COPD, BrCA, MI, CVA, osteoporosis.     PT Comments    Pt is making progress towards goals. Pt able to perform UE and LE supine there-ex, requires continuous verbal cues and demonstrations. Pt unable to follow simple commands to sit at EOB to transfer/amb and did not appear to want to cooperate. Pt stated "I'm going home" and took blankets off and tried to get out of bed. Pt able to utilize bed rail to pull herself up and roll onto her R side. Pt had several attempts of trying to get out of bed despite redirecting attention, able to get patient to relax in bed with covers on. Notified nurse of pt's behavior.    Follow Up Recommendations  SNF;Supervision/Assistance - 24 hour     Equipment Recommendations  None recommended by PT    Recommendations for Other Services       Precautions / Restrictions Precautions Precautions: Fall Restrictions Weight Bearing Restrictions: No    Mobility  Bed Mobility Overal bed mobility: Needs Assistance Bed Mobility: Supine to Sit     Supine to sit: Min assist     General bed mobility comments: Pt unable to follow commands to go from supine to sit, however pt had several attempts of trying to get out of bed by herself and was able to use bed rails to turn onto her R side.  Transfers                 General transfer comment: Deferred, pt unable to follow simple commands to sit at EOB despite cues and demonstration. Pt became agitated    Ambulation/Gait             General Gait Details: Deferred, pt unable to follow simple commands   Stairs            Wheelchair Mobility    Modified Rankin (Stroke Patients Only)       Balance                                            Cognition Arousal/Alertness: Awake/alert Behavior During Therapy: WFL for tasks assessed/performed Overall Cognitive Status: History of cognitive impairments - at baseline                                        Exercises Other Exercises Other Exercises: Supine ther-ex both sides 15x, SLRs, heel slides, shoulder raises, bicep curls, rows. Pt required demonstration of exercises, verbal and tactile cues to perform ther-ex. Easily distracted after several reps, re-directed attention throughout session.     General Comments        Pertinent Vitals/Pain Pain Assessment: No/denies pain    Home Living  Prior Function            PT Goals (current goals can now be found in the care plan section) Acute Rehab PT Goals Patient Stated Goal: "I want to go home" PT Goal Formulation: Patient unable to participate in goal setting Time For Goal Achievement: 05/05/17 Potential to Achieve Goals: Good Progress towards PT goals: Progressing toward goals    Frequency    Min 2X/week      PT Plan Current plan remains appropriate    Co-evaluation              AM-PAC PT "6 Clicks" Daily Activity  Outcome Measure  Difficulty turning over in bed (including adjusting bedclothes, sheets and blankets)?: None Difficulty moving from lying on back to sitting on the side of the bed? : None Difficulty sitting down on and standing up from a chair with arms (e.g., wheelchair, bedside commode, etc,.)?: Unable Help needed moving to and from a bed to chair (including a wheelchair)?: Total Help needed walking in hospital room?: A Lot Help needed climbing 3-5 steps with a  railing? : Total 6 Click Score: 13    End of Session   Activity Tolerance: Patient tolerated treatment well;Treatment limited secondary to agitation (Pt tried to get OOB several times, stated "I'm going home") Patient left: in bed;with call bell/phone within reach;with bed alarm set Nurse Communication: Mobility status PT Visit Diagnosis: Other abnormalities of gait and mobility (R26.89);Repeated falls (R29.6)     Time: 4709-6283 PT Time Calculation (min) (ACUTE ONLY): 23 min  Charges:                       G Codes:  Functional Assessment Tool Used: AM-PAC 6 Clicks Basic Mobility Functional Limitation: Mobility: Walking and moving around Mobility: Walking and Moving Around Current Status (M6294): At least 40 percent but less than 60 percent impaired, limited or restricted Mobility: Walking and Moving Around Goal Status [phone]): At least 20 percent but less than 40 percent impaired, limited or restricted    Manfred Arch, SPT   Manfred Arch 04/21/2017, 5:37 PM

## 2017-04-21 NOTE — Progress Notes (Signed)
CBG 66. 8 ounces nectar thick orange juice with sugar added given to pt. Will recheck CBG in 15 minutes

## 2017-04-21 NOTE — Progress Notes (Deleted)
.  h 

## 2017-04-22 ENCOUNTER — Other Ambulatory Visit: Payer: Self-pay | Admitting: *Deleted

## 2017-04-22 DIAGNOSIS — E118 Type 2 diabetes mellitus with unspecified complications: Secondary | ICD-10-CM | POA: Diagnosis not present

## 2017-04-22 DIAGNOSIS — Z794 Long term (current) use of insulin: Secondary | ICD-10-CM | POA: Diagnosis not present

## 2017-04-22 DIAGNOSIS — E1165 Type 2 diabetes mellitus with hyperglycemia: Secondary | ICD-10-CM | POA: Diagnosis not present

## 2017-04-22 NOTE — Patient Outreach (Addendum)
[facility]) Care Management  04/22/2017  [name] [name] Mar 16, 1932 [id number]   Phone call to [place] for [facility]. Patient's granddaughter's concerns discussed(patient's injury of unknown origin, and untimely notification) however per Santiago Glad she has no jurisdiction as patient is no longer in the facility in question. She suggested that patient's granddaughter contact the state with her concerns. Phone number given [phone].  This Education officer, museum will follow up with patient's granddaughter.    Sheralyn Boatman [facility] Care Management [phone]

## 2017-04-22 NOTE — Patient Outreach (Signed)
[facility]) Care Management  04/22/2017  [name] [name] [birth year] [id number]   Phone call to patient's granddaughter, Criss Alvine explained the role for the local [place] and the fact that she has no jurisdiction to address her concerns as patient is no longer in the facility in question.  This Education officer, museum discussed the recommendation to contact the State and provided her with the number. Per [name], patient continues to be hospitalized. Patient will discharge to skilled care when stable.  Plan: This social worker will follow up with patient once she transitions to skilled care.    Sheralyn Boatman [facility] Care Management [phone]

## 2017-04-24 ENCOUNTER — Ambulatory Visit: Payer: Self-pay | Admitting: *Deleted

## 2017-04-26 ENCOUNTER — Emergency Department
Admission: EM | Admit: 2017-04-26 | Discharge: 2017-04-26 | Disposition: A | Discharge status: Home / Self Care | Payer: Medicare Other | Attending: Emergency Medicine | Admitting: Emergency Medicine

## 2017-04-26 ENCOUNTER — Encounter: Payer: Self-pay | Admitting: Emergency Medicine

## 2017-04-26 ENCOUNTER — Emergency Department: Payer: Medicare Other

## 2017-04-26 DIAGNOSIS — Z8673 Personal history of transient ischemic attack (TIA), and cerebral infarction without residual deficits: Secondary | ICD-10-CM | POA: Diagnosis not present

## 2017-04-26 DIAGNOSIS — E785 Hyperlipidemia, unspecified: Secondary | ICD-10-CM | POA: Insufficient documentation

## 2017-04-26 DIAGNOSIS — Z794 Long term (current) use of insulin: Secondary | ICD-10-CM | POA: Insufficient documentation

## 2017-04-26 DIAGNOSIS — I252 Old myocardial infarction: Secondary | ICD-10-CM | POA: Insufficient documentation

## 2017-04-26 DIAGNOSIS — Z7982 Long term (current) use of aspirin: Secondary | ICD-10-CM | POA: Diagnosis not present

## 2017-04-26 DIAGNOSIS — Z853 Personal history of malignant neoplasm of breast: Secondary | ICD-10-CM | POA: Diagnosis not present

## 2017-04-26 DIAGNOSIS — Z79899 Other long term (current) drug therapy: Secondary | ICD-10-CM | POA: Insufficient documentation

## 2017-04-26 DIAGNOSIS — Z8542 Personal history of malignant neoplasm of other parts of uterus: Secondary | ICD-10-CM | POA: Insufficient documentation

## 2017-04-26 DIAGNOSIS — I2510 Atherosclerotic heart disease of native coronary artery without angina pectoris: Secondary | ICD-10-CM | POA: Diagnosis not present

## 2017-04-26 DIAGNOSIS — K5900 Constipation, unspecified: Secondary | ICD-10-CM

## 2017-04-26 DIAGNOSIS — R109 Unspecified abdominal pain: Secondary | ICD-10-CM | POA: Diagnosis not present

## 2017-04-26 DIAGNOSIS — J449 Chronic obstructive pulmonary disease, unspecified: Secondary | ICD-10-CM | POA: Insufficient documentation

## 2017-04-26 DIAGNOSIS — E1122 Type 2 diabetes mellitus with diabetic chronic kidney disease: Secondary | ICD-10-CM | POA: Insufficient documentation

## 2017-04-26 DIAGNOSIS — N183 Chronic kidney disease, stage 3 (moderate): Secondary | ICD-10-CM | POA: Insufficient documentation

## 2017-04-26 DIAGNOSIS — K5641 Fecal impaction: Secondary | ICD-10-CM

## 2017-04-26 LAB — HEPATIC FUNCTION PANEL
ALBUMIN: 3.6 g/dL (ref 3.5–5.0)
ALK PHOS: 125 U/L (ref 38–126)
ALT: 23 U/L (ref 14–54)
AST: 30 U/L (ref 15–41)
BILIRUBIN TOTAL: 0.8 mg/dL (ref 0.3–1.2)
Bilirubin, Direct: 0.1 mg/dL (ref 0.1–0.5)
Indirect Bilirubin: 0.7 mg/dL (ref 0.3–0.9)
TOTAL PROTEIN: 6.6 g/dL (ref 6.5–8.1)

## 2017-04-26 LAB — CBC WITH DIFFERENTIAL/PLATELET
BASOS ABS: 0.1 10*3/uL (ref 0–0.1)
BASOS PCT: 1 %
Eosinophils Absolute: 0.1 10*3/uL (ref 0–0.7)
Eosinophils Relative: 2 %
HEMATOCRIT: 31.6 % — AB (ref 35.0–47.0)
HEMOGLOBIN: 10.9 g/dL — AB (ref 12.0–16.0)
LYMPHS PCT: 14 %
Lymphs Abs: 1.4 10*3/uL (ref 1.0–3.6)
MCH: 32.9 pg (ref 26.0–34.0)
MCHC: 34.5 g/dL (ref 32.0–36.0)
MCV: 95.4 fL (ref 80.0–100.0)
MONO ABS: 0.6 10*3/uL (ref 0.2–0.9)
Monocytes Relative: 7 %
NEUTROS ABS: 7.5 10*3/uL — AB (ref 1.4–6.5)
NEUTROS PCT: 76 %
Platelets: 263 10*3/uL (ref 150–440)
RBC: 3.31 MIL/uL — AB (ref 3.80–5.20)
RDW: 16.7 % — ABNORMAL HIGH (ref 11.5–14.5)
WBC: 9.7 10*3/uL (ref 3.6–11.0)

## 2017-04-26 LAB — URINALYSIS, COMPLETE (UACMP) WITH MICROSCOPIC
BILIRUBIN URINE: NEGATIVE
HGB URINE DIPSTICK: NEGATIVE
Ketones, ur: 5 mg/dL — AB
LEUKOCYTES UA: NEGATIVE
NITRITE: NEGATIVE
Protein, ur: NEGATIVE mg/dL
SPECIFIC GRAVITY, URINE: 1.022 (ref 1.005–1.030)
pH: 5 (ref 5.0–8.0)

## 2017-04-26 LAB — BASIC METABOLIC PANEL
ANION GAP: 10 (ref 5–15)
BUN: 18 mg/dL (ref 6–20)
CALCIUM: 9 mg/dL (ref 8.9–10.3)
CHLORIDE: 100 mmol/L — AB (ref 101–111)
CO2: 25 mmol/L (ref 22–32)
Creatinine, Ser: 1.02 mg/dL — ABNORMAL HIGH (ref 0.44–1.00)
GFR calc non Af Amer: 49 mL/min — ABNORMAL LOW (ref >60)
GFR, EST AFRICAN AMERICAN: 57 mL/min — AB (ref >60)
Glucose, Bld: 269 mg/dL — ABNORMAL HIGH (ref 65–99)
Potassium: 4.2 mmol/L (ref 3.5–5.1)
SODIUM: 135 mmol/L (ref 135–145)

## 2017-04-26 LAB — TROPONIN I: TROPONIN I: 0.06 ng/mL — AB (ref <0.03)

## 2017-04-26 LAB — LIPASE, BLOOD: LIPASE: 22 U/L (ref 11–51)

## 2017-04-26 LAB — LACTIC ACID, PLASMA: LACTIC ACID, VENOUS: 1.4 mmol/L (ref 0.5–1.9)

## 2017-04-26 MED ORDER — MORPHINE SULFATE (PF) 2 MG/ML IV SOLN
2.0000 mg | Freq: Once | INTRAVENOUS | Status: AC
  Administered 2017-04-26: 2 mg via INTRAVENOUS
  Filled 2017-04-26: qty 1

## 2017-04-26 MED ORDER — IOPAMIDOL (ISOVUE-300) INJECTION 61%
100.0000 mL | Freq: Once | INTRAVENOUS | Status: AC | PRN
  Administered 2017-04-26: 100 mL via INTRAVENOUS

## 2017-04-26 MED ORDER — LIDOCAINE HCL 2 % EX GEL
1.0000 "application " | Freq: Once | CUTANEOUS | Status: AC
  Administered 2017-04-26: 1 via URETHRAL
  Filled 2017-04-26: qty 5

## 2017-04-26 MED ORDER — SORBITOL 70 % SOLN
960.0000 mL | TOPICAL_OIL | Freq: Once | ORAL | Status: AC
  Administered 2017-04-26: 960 mL via RECTAL
  Filled 2017-04-26: qty 240

## 2017-04-26 NOTE — ED Triage Notes (Signed)
Pt presents to ED via [facility] from [facility] c/o generalized abd pain starting yesterday. Was placed on PO reglan by NP but continued to have pain today so was sent to ED for evaluation. Pt has hx dementia and is unable to answer specific questions about pain but moans whenever abd is palpated.

## 2017-04-26 NOTE — ED Provider Notes (Signed)
[facility] Emergency Department Provider Note  ____________________________________________   First MD Initiated Contact with Patient 04/26/17 1118     (approximate)  I have reviewed the triage vital signs and the nursing notes.   HISTORY  Chief Complaint Abdominal Pain  level V exemption history Limited by the patient's dementia  HPI [name] [name] is a 83 y.o. female comes to the emergency department via EMS with 2 days of abdominal pain. She resides at a nursing home and yesterday the nurse practitioner at the nursing home noted the patient was having abdominal pain and prescribed her Reglan. The Reglan has not helped the pain and today the pain was severe so she recommended transport to the emergency department. The patient is only able to say that her pain is severe diffuse throughout her abdomen seems to be worse with movement and somewhat improved with rest. She has a complex past medical history including diabetes mellitus, COPD,and coronary artery disease.   Past Medical History:  Diagnosis Date  . Anxiety state, unspecified   . CAD (coronary artery disease)   . Cancer [facility])    2004 Right breast, found on mammogram, radiation therapy, Dr. Bryson Ha, uterine cancer,   . COPD (chronic obstructive pulmonary disease) ([place])   . Diabetes mellitus   . Esophageal reflux   . Heart burn   . Hyperlipidemia   . IBS (irritable bowel syndrome)   . Myocardial infarction [facility])    Cath negative except for 40% occlusion LAD.  Pt not candidate for betablocker or ACEI because of hypotension  . OSA (obstructive sleep apnea)   . Osteoporosis 02/21/09   DEXA scan showed osteoporosis with left femur T-score -2.8.  Marland Kitchen Presbyacusis   . Stroke ([place])   . Thyroid disease    Hypothyroid  . Vitamin D deficiency     Patient Active Problem List   Diagnosis Date Noted  . Hyperglycemia   . Acute kidney injury ([place])   . Metabolic encephalopathy 56/31/4970  .  Adjustment disorder with mixed anxiety and depressed mood   . Coffee ground emesis 02/12/2017  . Acute on chronic renal failure ([place]) 01/13/2017  . Dementia, vascular 12/03/2016  . Stroke ([place]) 08/20/2016  . Peripheral vascular disease ([place]) 04/09/2016  . DKA (diabetic ketoacidoses) ([place]) 06/08/2015  . CKD stage 3 due to type 2 diabetes mellitus ([place]) 02/22/2014  . Hypothyroidism 09/23/2012  . GERD (gastroesophageal reflux disease) 09/23/2012  . Anxiety 07/26/2012  . Hyperlipidemia with target LDL less than 70 12/03/2011  . DM (diabetes mellitus) type II uncontrolled, periph vascular disorder ([place]) 04/03/2011  . CAD (coronary artery disease) 12/13/2009    Past Surgical History:  Procedure Laterality Date  . ABDOMINAL HYSTERECTOMY     uterine cancer  . BREAST SURGERY    . COLON SURGERY  2013   done at [facility]    Prior to Admission medications   Medication Sig Start Date End Date Taking? Authorizing Provider  aspirin EC 81 MG tablet Take 1 tablet (81 mg total) by mouth daily. 04/09/16  Yes Cook, Jayce G, DO  atorvastatin (LIPITOR) 80 MG tablet Take 1 tablet (80 mg total) by mouth daily. 08/21/16  Yes Cook, Jayce G, DO  clopidogrel (PLAVIX) 75 MG tablet TAKE 1 TABLET(75 MG) BY MOUTH DAILY 01/01/17  Yes Cook, Jayce G, DO  fluticasone (FLONASE) 50 MCG/ACT nasal spray Place 2 sprays into both nostrils daily. 01/01/17  Yes Cook, Jayce G, DO  insulin aspart (NOVOLOG) 100 UNIT/ML injection Inject 0-9  Units into the skin 3 (three) times daily with meals. Patient taking differently: Inject 0-10 Units into the skin 3 (three) times daily with meals.  04/07/17  Yes Demetrios Loll, MD  insulin glargine (LANTUS) 100 UNIT/ML injection Inject 0.15 mLs (15 Units total) into the skin daily. Patient taking differently: Inject 20 Units into the skin daily.  04/08/17  Yes Demetrios Loll, MD  levothyroxine (SYNTHROID, LEVOTHROID) 100 MCG tablet TAKE 1 TABLET(100 MCG) BY MOUTH DAILY 01/01/17  Yes Lacinda Axon, Jayce G, DO  loperamide  (IMODIUM) 2 MG capsule Take 4 mg by mouth as needed for diarrhea or loose stools.   Yes [provider]  metoCLOPramide (REGLAN) 5 MG tablet Take 5 mg by mouth 3 (three) times daily before meals.   Yes [provider]  midodrine (PROAMATINE) 10 MG tablet Take 1 tablet (10 mg total) by mouth 3 (three) times daily with meals. 01/30/17  Yes Cook, Jayce G, DO  pantoprazole (PROTONIX) 40 MG tablet Take 1 tablet (40 mg total) by mouth 2 (two) times daily. 01/04/16  Yes Jackolyn Confer, MD  sennosides-docusate sodium (SENOKOT-S) 8.6-50 MG tablet Take 2 tablets by mouth daily.   Yes [provider]  sertraline (ZOLOFT) 25 MG tablet Take 1 tablet (25 mg total) by mouth daily after breakfast. 02/17/17  Yes Fritzi Mandes, MD  insulin aspart (NOVOLOG) 100 UNIT/ML injection Inject 3 Units into the skin 3 (three) times daily with meals. Patient not taking: Reported on 04/26/2017 04/07/17   Demetrios Loll, MD  insulin aspart (NOVOLOG) 100 UNIT/ML injection Inject 0-5 Units into the skin at bedtime. Patient not taking: Reported on 04/26/2017 04/07/17   Demetrios Loll, MD    Allergies Penicillins  Family History  Problem Relation Age of Onset  . Diabetes Mother   . Heart attack Father     Social History Social History  Substance Use Topics  . Smoking status: Never Smoker  . Smokeless tobacco: Never Used  . Alcohol use No    Review of Systems level V exemption history Limited by the patient's dementia  ____________________________________________   PHYSICAL EXAM:  VITAL SIGNS: ED Triage Vitals  Enc Vitals Group     BP      Pulse      Resp      Temp      Temp src      SpO2      Weight      Height      Head Circumference      Peak Flow      Pain Score      Pain Loc      Pain Edu?      Excl. in [place]?     level V exemption history Limited by the patient's dementia  ____________________________________________   DIFFERENTIAL includes but not limited to  appendicitis,  diverticulitis, volvulus, bowel obstruction, perforated viscus ____________________________________________   LABS (all labs ordered are listed, but only abnormal results are displayed)  Labs Reviewed  BASIC METABOLIC PANEL - Abnormal; Notable for the following:       Result Value   Chloride 100 (*)    Glucose, Bld 269 (*)    Creatinine, Ser 1.02 (*)    GFR calc non Af Amer 49 (*)    GFR calc Af Amer 57 (*)    All other components within normal limits  TROPONIN I - Abnormal; Notable for the following:    Troponin I 0.06 (*)    All other components within  normal limits  CBC WITH DIFFERENTIAL/PLATELET - Abnormal; Notable for the following:    RBC 3.31 (*)    Hemoglobin 10.9 (*)    HCT 31.6 (*)    RDW 16.7 (*)    Neutro Abs 7.5 (*)    All other components within normal limits  URINALYSIS, COMPLETE (UACMP) WITH MICROSCOPIC - Abnormal; Notable for the following:    Color, Urine YELLOW (*)    APPearance CLEAR (*)    Glucose, UA >=500 (*)    Ketones, ur 5 (*)    Bacteria, UA FEW (*)    Squamous Epithelial / LPF 0-5 (*)    All other components within normal limits  HEPATIC FUNCTION PANEL  LACTIC ACID, PLASMA  LIPASE, BLOOD    blood work reviewed and interpreted by me and shows slightly elevated troponin which is nonspecific and likely secondary to stress and not primary cardiac ischemia normal lactate suggestive against bowel ischemia __________________________________________   EKG   ____________________________________________  RADIOLOGY  CT scan of the abdomen and pelvis reviewed by me shows a large fecal burden with fecal impaction ____________________________________________   PROCEDURES  Procedure(s) performed: yes  ------------------------------------------------------------------------------------------------------------------- Fecal Disimpaction Procedure Note:  Performed by me:  Patient placed in the lateral recumbent position with knees drawn towards  chest. Nurse present for patient support. Large amount of hard brown stool removed. No complications during procedure.   ------------------------------------------------------------------------------------------------------------------    Procedures  Critical Care performed: no  Observation: no ____________________________________________   INITIAL IMPRESSION / ASSESSMENT AND PLAN / ED COURSE  Pertinent labs & imaging results that were available during my care of the patient were reviewed by me and considered in my medical decision making (see chart for details).  While the patient is hemodynamically stable, she has exquisite tenderness throughout her abdomen with multiple comorbidities. Differential is broad but at this point she requires broad labs, in and out catheterization, but most importantly a CT scan of her abdomen pelvis to evaluate for surgical etiology of her symptoms.     the patient's CT scan is negative for acute infectious or surgical pathology although does show an enormous fecal impaction and large fecal burden. I consented the patient for disimpaction was able to remove a large amount of feces.  She subsequently had an enema and another large bowel movement.  Her symptoms are now resolved.  she is medically stable for outpatient management. ____________________________________________   FINAL CLINICAL IMPRESSION(S) / ED DIAGNOSES  Final diagnoses:  Fecal impaction ([place])  Constipation, unspecified constipation type      NEW MEDICATIONS STARTED DURING THIS VISIT:  Discharge Medication List as of 04/26/2017  3:34 PM       Note:  This document was prepared using Dragon voice recognition software and may include unintentional dictation errors.     Darel Hong, MD 04/26/17 2246

## 2017-04-26 NOTE — ED Notes (Signed)
Patient's discharge and follow up information reviewed with patient by ED nursing staff and patient given the opportunity to ask questions pertaining to ED visit and discharge plan of care. Patient advised that should symptoms not continue to improve, resolve entirely, or should new symptoms develop then a follow up visit with their PCP or a return visit to the ED may be warranted. Patient verbalized consent and understanding of discharge plan of care including potential need for further evaluation. Patient discharged in stable condition per attending ED physician on duty.   Report called to [employer] and discharge instructions relayed. AVS paperwork given to EMS transport team.

## 2017-04-26 NOTE — ED Notes (Signed)
Pt disimpacted by EDP [place]. Pharmacy notified for SMOG enema.

## 2017-04-26 NOTE — ED Notes (Signed)
Date and time results received: 04/26/17 1217 (use smartphrase ".now" to insert current time)  Test: troponin Critical Value: 0.06  Name of Provider Notified: dr. Mable Paris  Orders Received? Or Actions Taken?: no new orders at this time

## 2017-04-26 NOTE — Discharge Instructions (Signed)
Fortunately today you did not require antibiotics or surgery, however you were extraordinarily constipated. Please increase the amount of fiber as well as fluids in your diet and make an appointment to see your primary care physician next week for a recheck. Return to the emergency department sooner for any concerns.  It was a pleasure to take care of you today, and thank you for coming to our emergency department.  If you have any questions or concerns before leaving please ask the nurse to grab me and I'm more than happy to go through your aftercare instructions again.  If you were prescribed any opioid pain medication today such as Norco, Vicodin, Percocet, morphine, hydrocodone, or oxycodone please make sure you do not drive when you are taking this medication as it can alter your ability to drive safely.  If you have any concerns once you are home that you are not improving or are in fact getting worse before you can make it to your follow-up appointment, please do not hesitate to call 911 and come back for further evaluation.  Darel Hong, MD  Results for orders placed or performed during the hospital encounter of 07/29/70  Basic metabolic panel  Result Value Ref Range   Sodium 135 135 - 145 mmol/L   Potassium 4.2 3.5 - 5.1 mmol/L   Chloride 100 (L) 101 - 111 mmol/L   CO2 25 22 - 32 mmol/L   Glucose, Bld 269 (H) 65 - 99 mg/dL   BUN 18 6 - 20 mg/dL   Creatinine, Ser 1.02 (H) 0.44 - 1.00 mg/dL   Calcium 9.0 8.9 - 10.3 mg/dL   GFR calc non Af Amer 49 (L) >60 mL/min   GFR calc Af Amer 57 (L) >60 mL/min   Anion gap 10 5 - 15  Hepatic function panel  Result Value Ref Range   Total Protein 6.6 6.5 - 8.1 g/dL   Albumin 3.6 3.5 - 5.0 g/dL   AST 30 15 - 41 U/L   ALT 23 14 - 54 U/L   Alkaline Phosphatase 125 38 - 126 U/L   Total Bilirubin 0.8 0.3 - 1.2 mg/dL   Bilirubin, Direct 0.1 0.1 - 0.5 mg/dL   Indirect Bilirubin 0.7 0.3 - 0.9 mg/dL  Lactic acid, plasma  Result Value Ref Range     Lactic Acid, Venous 1.4 0.5 - 1.9 mmol/L  Troponin I  Result Value Ref Range   Troponin I 0.06 (HH) <0.03 ng/mL  CBC with Differential  Result Value Ref Range   WBC 9.7 3.6 - 11.0 K/uL   RBC 3.31 (L) 3.80 - 5.20 MIL/uL   Hemoglobin 10.9 (L) 12.0 - 16.0 g/dL   HCT 31.6 (L) 35.0 - 47.0 %   MCV 95.4 80.0 - 100.0 fL   MCH 32.9 26.0 - 34.0 pg   MCHC 34.5 32.0 - 36.0 g/dL   RDW 16.7 (H) 11.5 - 14.5 %   Platelets 263 150 - 440 K/uL   Neutrophils Relative % 76 %   Neutro Abs 7.5 (H) 1.4 - 6.5 K/uL   Lymphocytes Relative 14 %   Lymphs Abs 1.4 1.0 - 3.6 K/uL   Monocytes Relative 7 %   Monocytes Absolute 0.6 0.2 - 0.9 K/uL   Eosinophils Relative 2 %   Eosinophils Absolute 0.1 0 - 0.7 K/uL   Basophils Relative 1 %   Basophils Absolute 0.1 0 - 0.1 K/uL  Urinalysis, Complete w Microscopic  Result Value Ref Range   Color, Urine YELLOW (  A) YELLOW   APPearance CLEAR (A) CLEAR   Specific Gravity, Urine 1.022 1.005 - 1.030   pH 5.0 5.0 - 8.0   Glucose, UA >=500 (A) NEGATIVE mg/dL   Hgb urine dipstick NEGATIVE NEGATIVE   Bilirubin Urine NEGATIVE NEGATIVE   Ketones, ur 5 (A) NEGATIVE mg/dL   Protein, ur NEGATIVE NEGATIVE mg/dL   Nitrite NEGATIVE NEGATIVE   Leukocytes, UA NEGATIVE NEGATIVE   RBC / HPF 0-5 0 - 5 RBC/hpf   WBC, UA 0-5 0 - 5 WBC/hpf   Bacteria, UA FEW (A) NONE SEEN   Squamous Epithelial / LPF 0-5 (A) NONE SEEN   Mucus PRESENT   Lipase, blood  Result Value Ref Range   Lipase 22 11 - 51 U/L   Dg Shoulder Right  Result Date: 04/18/2017 CLINICAL DATA:  Shoulder pain following fall 1 week ago, initial encounter EXAM: RIGHT SHOULDER - 2+ VIEW COMPARISON:  None. FINDINGS: Degenerative changes of the acromioclavicular joint are seen. No acute fracture or dislocation is noted. The underlying bony thorax is within normal limits. No soft tissue abnormality is seen. IMPRESSION: Degenerative change without acute abnormality. Electronically Signed   By: Inez Catalina M.D.   On:  04/18/2017 09:12   Ct Head Wo Contrast  Result Date: 04/15/2017 CLINICAL DATA:  Fall EXAM: CT HEAD WITHOUT CONTRAST CT CERVICAL SPINE WITHOUT CONTRAST TECHNIQUE: Multidetector CT imaging of the head and cervical spine was performed following the standard protocol without intravenous contrast. Multiplanar CT image reconstructions of the cervical spine were also generated. COMPARISON:  CT head dated 04/05/2017 FINDINGS: CT HEAD FINDINGS Brain: No evidence of acute infarction, hemorrhage, extra-axial collection or mass lesion/mass effect. Global cortical and central atrophy. Secondary ventricular prominence. Extensive subcortical white matter and periventricular small vessel ischemic changes. Vascular: No hyperdense vessel or unexpected calcification. Skull: Normal. Negative for fracture or focal lesion. Sinuses/Orbits: The visualized paranasal sinuses are essentially clear. The mastoid air cells are unopacified. Other: None. CT CERVICAL SPINE FINDINGS Alignment: Normal cervical lordosis. Skull base and vertebrae: No acute fracture. No primary bone lesion or focal pathologic process. Soft tissues and spinal canal: No prevertebral fluid or swelling. No visible canal hematoma. Disc levels: Mild degenerative changes of the mid/lower cervical spine. Spinal canal is patent. Upper chest: Visualized lung apices are clear. Other: Visualized thyroid is unremarkable. IMPRESSION: No evidence of acute intracranial abnormality. Atrophy with small vessel ischemic changes. No evidence of traumatic injury to the cervical spine. Mild degenerative changes. Electronically Signed   By: Julian Hy M.D.   On: 04/15/2017 18:50   Ct Head Wo Contrast  Result Date: 04/05/2017 CLINICAL DATA:  Altered mental status, nausea, and vomiting. EXAM: CT HEAD WITHOUT CONTRAST TECHNIQUE: Contiguous axial images were obtained from the base of the skull through the vertex without intravenous contrast. 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 Extensive confluent hypoattenuation throughout the cerebral white matter bilaterally is unchanged. Ventricular enlargement is unchanged and may reflect advanced central predominant cerebral atrophy although normal pressure hydrocephalus is not excluded in the appropriate clinical setting. No acute large territory infarct, intracranial hemorrhage, mass, midline shift, or extra-axial fluid collection is identified. Vascular: No hyperdense vessel. Skull: No fracture focal osseous lesion. Sinuses/Orbits: Visualized paranasal sinuses and mastoid air cells are clear. Bilateral cataract extraction. Other: None. IMPRESSION: 1. No evidence of acute intracranial abnormality. 2. Extensive chronic small vessel ischemic disease. 3. Unchanged ventricular enlargement which may reflect cerebral atrophy versus  normal pressure hydrocephalus. Electronically Signed   By: Logan Bores M.D.   On: 04/05/2017 17:00   Ct Cervical Spine Wo Contrast  Result Date: 04/15/2017 CLINICAL DATA:  Fall EXAM: CT HEAD WITHOUT CONTRAST CT CERVICAL SPINE WITHOUT CONTRAST TECHNIQUE: Multidetector CT imaging of the head and cervical spine was performed following the standard protocol without intravenous contrast. Multiplanar CT image reconstructions of the cervical spine were also generated. COMPARISON:  CT head dated 04/05/2017 FINDINGS: CT HEAD FINDINGS Brain: No evidence of acute infarction, hemorrhage, extra-axial collection or mass lesion/mass effect. Global cortical and central atrophy. Secondary ventricular prominence. Extensive subcortical white matter and periventricular small vessel ischemic changes. Vascular: No hyperdense vessel or unexpected calcification. Skull: Normal. Negative for fracture or focal lesion. Sinuses/Orbits: The visualized paranasal sinuses are essentially clear. The mastoid air cells are unopacified. Other: None. CT CERVICAL SPINE FINDINGS  Alignment: Normal cervical lordosis. Skull base and vertebrae: No acute fracture. No primary bone lesion or focal pathologic process. Soft tissues and spinal canal: No prevertebral fluid or swelling. No visible canal hematoma. Disc levels: Mild degenerative changes of the mid/lower cervical spine. Spinal canal is patent. Upper chest: Visualized lung apices are clear. Other: Visualized thyroid is unremarkable. IMPRESSION: No evidence of acute intracranial abnormality. Atrophy with small vessel ischemic changes. No evidence of traumatic injury to the cervical spine. Mild degenerative changes. Electronically Signed   By: Julian Hy M.D.   On: 04/15/2017 18:50   Mr Brain Wo Contrast  Result Date: 04/16/2017 CLINICAL DATA:  Altered mental status EXAM: MRI HEAD WITHOUT CONTRAST TECHNIQUE: Multiplanar, multiecho pulse sequences of the brain and surrounding structures were obtained without intravenous contrast. COMPARISON:  Head CT 04/15/2017 FINDINGS: Brain: The midline structures are normal. There is no focal diffusion restriction to indicate acute infarct. There is diffuse confluent hyperintense T2-weighted signal within the periventricular, deep and juxtacortical white matter, most often seen in the setting of chronic microvascular ischemia. No intraparenchymal hematoma or chronic microhemorrhage. Advanced volume loss without lobar predilection. The dura is normal and there is no extra-axial collection. Vascular: Major intracranial arterial and venous sinus flow voids are preserved. Skull and upper cervical spine: The visualized skull base, calvarium, upper cervical spine and extracranial soft tissues are normal. Sinuses/Orbits: No fluid levels or advanced mucosal thickening. No mastoid or middle ear effusion. Normal orbits. IMPRESSION: 1. No acute intracranial abnormality. 2. Advanced atrophy and chronic ischemic microangiopathy. Electronically Signed   By: Ulyses Jarred M.D.   On: 04/16/2017 22:34   US  Pelvis (transabdominal Only)  Result Date: 04/17/2017 CLINICAL DATA:  Postmenopausal bleeding.  Prior hysterectomy EXAM: TRANSABDOMINAL ULTRASOUND OF PELVIS TECHNIQUE: Transabdominal ultrasound examination of the pelvis was performed including evaluation of the uterus, ovaries, adnexal regions, and pelvic cul-de-sac. COMPARISON:  CT 06/08/2015 FINDINGS: Uterus Measurements: Prior hysterectomy. Endometrium Thickness: N/A. Right ovary Measurements: Not visualized. No adnexal mass seen. Left ovary Measurements: None visualized. No adnexal mass seen. Other findings: No abnormal free fluid. There is fluid filled tubular shaped structure posterior to the urinary bladder. I favor this represents a fluid filled vaginal cuff. This may be related to the external catheter in place. IMPRESSION: Fluid filled vaginal cuff noted. This may be related to the external catheter. No other pelvic mass or abnormality noted. Prior hysterectomy. Electronically Signed   By: Rolm Baptise M.D.   On: 04/17/2017 12:10   Ct Abdomen Pelvis W Contrast  Result Date: 04/26/2017 CLINICAL DATA:  Generalized abdominal pain since yesterday. Diffuse abdominal tenderness. EXAM: CT ABDOMEN AND  PELVIS WITH CONTRAST TECHNIQUE: Multidetector CT imaging of the abdomen and pelvis was performed using the standard protocol following bolus administration of intravenous contrast. CONTRAST:  143mL ISOVUE-300 IOPAMIDOL (ISOVUE-300) INJECTION 61% COMPARISON:  Pelvic ultrasound dated 04/17/2017. Abdomen and pelvis CT dated 06/08/2015. FINDINGS: Lower chest: Minimal bilateral dependent atelectasis. Hepatobiliary: 1.4 cm right lobe liver cyst and 0.6 cm left lobe liver cysts. Mild diffuse low density of the liver relative to the spleen. Normal appearing gallbladder. Pancreas: Diffusely atrophied with some progression. Spleen: Normal in size without focal abnormality. Adrenals/Urinary Tract: Normal appearing adrenal glands. Small bilateral renal cysts. Interval  moderate dilatation of both renal collecting systems. There is also moderate dilatation of the right ureter to the level of the superior aspect of a dilated urinary bladder which appears be compressing the ureter. There is mild dilatation of the left ureter to the level of dilated sigmoid colon containing stool and fluid, displaced by the dilated urinary bladder. No urinary tract calculi are seen. Stomach/Bowel: The rectum and sigmoid colon are dilated and filled with fluid and stool. The more proximal portions of the colon far last dilated, containing stool, gas and some fluid. There is a small bowel to right colon anastomosis. No gastric or small bowel dilatation is seen. There is a small to moderate-sized hiatal hernia. Surgically absent appendix. Vascular/Lymphatic: Atheromatous arterial calcifications without aneurysm. No enlarged lymph nodes. Reproductive: Status post hysterectomy. No adnexal masses. Other: Minimal free peritoneal fluid. Musculoskeletal: Mild lumbar and lower thoracic spine degenerative changes. IMPRESSION: 1. Dilated urinary bladder. 2. Rectal fecal impaction with associated dilatation of the sigmoid colon, containing fluid and stool. There is less dilatation of the remainder the colon. 3. Interval moderate bilateral hydronephrosis due to compression of the right ureter by the dilated urinary bladder and the left ureter by the dilated sigmoid colon. 4. Minimal free peritoneal fluid. 5. Mild diffuse hepatic steatosis. 6. Small to moderate-sized hiatal hernia. Electronically Signed   By: Claudie Revering M.D.   On: 04/26/2017 13:45   Dg Chest Port 1 View  Result Date: 04/15/2017 CLINICAL DATA:  Fall EXAM: PORTABLE CHEST 1 VIEW COMPARISON:  04/05/2017 FINDINGS: Lungs are clear.  No pleural effusion or pneumothorax. The heart is normal in size. IMPRESSION: No evidence of acute cardiopulmonary disease. Electronically Signed   By: Julian Hy M.D.   On: 04/15/2017 18:47   Dg Chest Portable  1 View  Result Date: 04/05/2017 CLINICAL DATA:  Altered mental status. EXAM: PORTABLE CHEST 1 VIEW COMPARISON:  Radiograph February 12, 2017. FINDINGS: The heart size and mediastinal contours are within normal limits. Atherosclerosis of thoracic aorta is noted. No pneumothorax or pleural effusion is noted. Both lungs are clear. The visualized skeletal structures are unremarkable. IMPRESSION: Aortic atherosclerosis.  No acute cardiopulmonary abnormality seen. Electronically Signed   By: Marijo Conception, M.D.   On: 04/05/2017 09:07

## 2017-04-26 NOTE — ED Notes (Signed)
Completed SMOG enema. Pt had 2 large soft BM.

## 2017-04-29 ENCOUNTER — Ambulatory Visit
Admission: EM | Admit: 2017-04-29 | Discharge: 2017-04-29 | Disposition: A | Discharge status: Home / Self Care | Payer: No Typology Code available for payment source | Source: Ambulatory Visit | Attending: Emergency Medicine | Admitting: Emergency Medicine

## 2017-04-29 ENCOUNTER — Emergency Department: Payer: Medicare Other

## 2017-04-29 ENCOUNTER — Emergency Department
Admission: EM | Admit: 2017-04-29 | Discharge: 2017-04-30 | Disposition: A | Discharge status: Home / Self Care | Payer: Medicare Other | Attending: Emergency Medicine | Admitting: Emergency Medicine

## 2017-04-29 DIAGNOSIS — E1122 Type 2 diabetes mellitus with diabetic chronic kidney disease: Secondary | ICD-10-CM | POA: Insufficient documentation

## 2017-04-29 DIAGNOSIS — S52501A Unspecified fracture of the lower end of right radius, initial encounter for closed fracture: Secondary | ICD-10-CM | POA: Insufficient documentation

## 2017-04-29 DIAGNOSIS — E876 Hypokalemia: Secondary | ICD-10-CM | POA: Diagnosis not present

## 2017-04-29 DIAGNOSIS — S52601A Unspecified fracture of lower end of right ulna, initial encounter for closed fracture: Secondary | ICD-10-CM | POA: Diagnosis not present

## 2017-04-29 DIAGNOSIS — S0083XA Contusion of other part of head, initial encounter: Secondary | ICD-10-CM | POA: Diagnosis not present

## 2017-04-29 DIAGNOSIS — I129 Hypertensive chronic kidney disease with stage 1 through stage 4 chronic kidney disease, or unspecified chronic kidney disease: Secondary | ICD-10-CM | POA: Insufficient documentation

## 2017-04-29 DIAGNOSIS — Z8673 Personal history of transient ischemic attack (TIA), and cerebral infarction without residual deficits: Secondary | ICD-10-CM | POA: Diagnosis not present

## 2017-04-29 DIAGNOSIS — W19XXXA Unspecified fall, initial encounter: Secondary | ICD-10-CM | POA: Insufficient documentation

## 2017-04-29 DIAGNOSIS — Z7982 Long term (current) use of aspirin: Secondary | ICD-10-CM | POA: Diagnosis not present

## 2017-04-29 DIAGNOSIS — S0990XA Unspecified injury of head, initial encounter: Secondary | ICD-10-CM | POA: Diagnosis not present

## 2017-04-29 DIAGNOSIS — Z7902 Long term (current) use of antithrombotics/antiplatelets: Secondary | ICD-10-CM | POA: Diagnosis not present

## 2017-04-29 DIAGNOSIS — Y999 Unspecified external cause status: Secondary | ICD-10-CM | POA: Insufficient documentation

## 2017-04-29 DIAGNOSIS — Y939 Activity, unspecified: Secondary | ICD-10-CM | POA: Insufficient documentation

## 2017-04-29 DIAGNOSIS — Z79899 Other long term (current) drug therapy: Secondary | ICD-10-CM | POA: Insufficient documentation

## 2017-04-29 DIAGNOSIS — J449 Chronic obstructive pulmonary disease, unspecified: Secondary | ICD-10-CM | POA: Diagnosis not present

## 2017-04-29 DIAGNOSIS — N183 Chronic kidney disease, stage 3 (moderate): Secondary | ICD-10-CM | POA: Insufficient documentation

## 2017-04-29 DIAGNOSIS — Y92129 Unspecified place in nursing home as the place of occurrence of the external cause: Secondary | ICD-10-CM | POA: Insufficient documentation

## 2017-04-29 DIAGNOSIS — Z794 Long term (current) use of insulin: Secondary | ICD-10-CM | POA: Diagnosis not present

## 2017-04-29 DIAGNOSIS — E039 Hypothyroidism, unspecified: Secondary | ICD-10-CM | POA: Diagnosis not present

## 2017-04-29 DIAGNOSIS — S199XXA Unspecified injury of neck, initial encounter: Secondary | ICD-10-CM | POA: Diagnosis not present

## 2017-04-29 DIAGNOSIS — F0390 Unspecified dementia without behavioral disturbance: Secondary | ICD-10-CM | POA: Insufficient documentation

## 2017-04-29 DIAGNOSIS — I2510 Atherosclerotic heart disease of native coronary artery without angina pectoris: Secondary | ICD-10-CM | POA: Diagnosis not present

## 2017-04-29 LAB — BASIC METABOLIC PANEL
Anion gap: 10 (ref 5–15)
BUN: 14 mg/dL (ref 6–20)
CHLORIDE: 102 mmol/L (ref 101–111)
CO2: 25 mmol/L (ref 22–32)
CREATININE: 1.18 mg/dL — AB (ref 0.44–1.00)
Calcium: 9.1 mg/dL (ref 8.9–10.3)
GFR calc non Af Amer: 41 mL/min — ABNORMAL LOW (ref >60)
GFR, EST AFRICAN AMERICAN: 48 mL/min — AB (ref >60)
Glucose, Bld: 200 mg/dL — ABNORMAL HIGH (ref 65–99)
POTASSIUM: 3 mmol/L — AB (ref 3.5–5.1)
SODIUM: 137 mmol/L (ref 135–145)

## 2017-04-29 LAB — CBC
HEMATOCRIT: 28.7 % — AB (ref 35.0–47.0)
HEMOGLOBIN: 9.9 g/dL — AB (ref 12.0–16.0)
MCH: 32.3 pg (ref 26.0–34.0)
MCHC: 34.4 g/dL (ref 32.0–36.0)
MCV: 94.1 fL (ref 80.0–100.0)
Platelets: 256 10*3/uL (ref 150–440)
RBC: 3.05 MIL/uL — AB (ref 3.80–5.20)
RDW: 16.7 % — ABNORMAL HIGH (ref 11.5–14.5)
WBC: 4.9 10*3/uL (ref 3.6–11.0)

## 2017-04-29 LAB — TROPONIN I: Troponin I: <0.03 ng/mL (ref <0.03)

## 2017-04-29 MED ORDER — ACETAMINOPHEN 500 MG PO TABS
1000.0000 mg | ORAL_TABLET | Freq: Once | ORAL | Status: AC
  Administered 2017-04-29: 1000 mg via ORAL
  Filled 2017-04-29: qty 2

## 2017-04-29 MED ORDER — TETANUS-DIPHTH-ACELL PERTUSSIS 5-2.5-18.5 LF-MCG/0.5 IM SUSP
0.5000 mL | Freq: Once | INTRAMUSCULAR | Status: AC
  Administered 2017-04-29: 0.5 mL via INTRAMUSCULAR
  Filled 2017-04-29: qty 0.5

## 2017-04-29 MED ORDER — POTASSIUM CHLORIDE 20 MEQ PO PACK
40.0000 meq | PACK | Freq: Once | ORAL | Status: AC
  Administered 2017-04-29: 40 meq via ORAL
  Filled 2017-04-29: qty 2

## 2017-04-29 MED ORDER — LIDOCAINE HCL (PF) 1 % IJ SOLN
INTRAMUSCULAR | Status: AC
  Filled 2017-04-29: qty 10

## 2017-04-29 MED ORDER — OXYCODONE HCL 5 MG PO TABS
5.0000 mg | ORAL_TABLET | Freq: Once | ORAL | Status: AC
  Administered 2017-04-29: 5 mg via ORAL
  Filled 2017-04-29: qty 1

## 2017-04-29 MED ORDER — FENTANYL CITRATE (PF) 100 MCG/2ML IJ SOLN
25.0000 ug | Freq: Once | INTRAMUSCULAR | Status: AC
  Administered 2017-04-29: 25 ug via INTRAVENOUS
  Filled 2017-04-29: qty 2

## 2017-04-29 MED ORDER — OXYCODONE HCL 5 MG PO TABS: 2.5000 mg | ORAL_TABLET | ORAL | 0 refills | Status: DC | PRN
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
## 2017-04-29 NOTE — ED Notes (Signed)
Patient transported to CT 

## 2017-04-29 NOTE — Discharge Instructions (Signed)
Keep splint dry and in place. Do not remove until cleared by orthopedics. Follow up with orthopedics in 3 days. For pain take tylenol 1000mg  every 8 hours and for breakthrough pain take oxycodone 2.5mg  every 4 hours. Take senna and colace daily if you need the oxycodone. Return to the ER for severe pain, pins and needles of the arm, changes in color of the finger including blue or white discoloration.

## 2017-04-29 NOTE — ED Triage Notes (Signed)
Pt comes into the ED via EMS from [facility] with an unwitnessed fall . Pt was found face down on the floor alert. Pt has a hx of dementia and is at baseline. Pt arrival with c-collar in place and a splint to the right FA with noted deformity. Pt has a hematoma with lac to the right brow with controlled bleeding.

## 2017-04-30 NOTE — ED Notes (Signed)
SANE nurse to bedside.
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
## 2017-04-30 NOTE — ED Notes (Signed)
Awaiting EMS transport.  
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
## 2017-05-04 DIAGNOSIS — S52571A Other intraarticular fracture of lower end of right radius, initial encounter for closed fracture: Secondary | ICD-10-CM | POA: Diagnosis not present

## 2017-05-05 ENCOUNTER — Ambulatory Visit: Payer: Medicare Other | Admitting: Registered Nurse

## 2017-05-05 ENCOUNTER — Ambulatory Visit: Payer: Medicare Other

## 2017-05-05 ENCOUNTER — Ambulatory Visit
Admission: RE | Admit: 2017-05-05 | Discharge: 2017-05-05 | Disposition: A | Discharge status: Home / Self Care | Payer: Medicare Other | Source: Ambulatory Visit | Attending: Orthopedic Surgery | Admitting: Orthopedic Surgery

## 2017-05-05 ENCOUNTER — Ambulatory Visit: Payer: Self-pay | Admitting: *Deleted

## 2017-05-05 ENCOUNTER — Encounter
Admission: RE | Disposition: A | Discharge status: Home / Self Care | Payer: Self-pay | Source: Ambulatory Visit | Attending: Orthopedic Surgery

## 2017-05-05 ENCOUNTER — Encounter: Payer: Self-pay | Admitting: *Deleted

## 2017-05-05 DIAGNOSIS — K219 Gastro-esophageal reflux disease without esophagitis: Secondary | ICD-10-CM | POA: Diagnosis not present

## 2017-05-05 DIAGNOSIS — S52501A Unspecified fracture of the lower end of right radius, initial encounter for closed fracture: Secondary | ICD-10-CM | POA: Insufficient documentation

## 2017-05-05 DIAGNOSIS — I252 Old myocardial infarction: Secondary | ICD-10-CM | POA: Insufficient documentation

## 2017-05-05 DIAGNOSIS — Z8781 Personal history of (healed) traumatic fracture: Secondary | ICD-10-CM

## 2017-05-05 DIAGNOSIS — Z923 Personal history of irradiation: Secondary | ICD-10-CM | POA: Diagnosis not present

## 2017-05-05 DIAGNOSIS — I2510 Atherosclerotic heart disease of native coronary artery without angina pectoris: Secondary | ICD-10-CM | POA: Insufficient documentation

## 2017-05-05 DIAGNOSIS — Z79899 Other long term (current) drug therapy: Secondary | ICD-10-CM | POA: Diagnosis not present

## 2017-05-05 DIAGNOSIS — G4730 Sleep apnea, unspecified: Secondary | ICD-10-CM | POA: Diagnosis not present

## 2017-05-05 DIAGNOSIS — D649 Anemia, unspecified: Secondary | ICD-10-CM | POA: Insufficient documentation

## 2017-05-05 DIAGNOSIS — I4891 Unspecified atrial fibrillation: Secondary | ICD-10-CM | POA: Diagnosis not present

## 2017-05-05 DIAGNOSIS — M25531 Pain in right wrist: Secondary | ICD-10-CM | POA: Diagnosis not present

## 2017-05-05 DIAGNOSIS — Z7984 Long term (current) use of oral hypoglycemic drugs: Secondary | ICD-10-CM | POA: Insufficient documentation

## 2017-05-05 DIAGNOSIS — E119 Type 2 diabetes mellitus without complications: Secondary | ICD-10-CM | POA: Insufficient documentation

## 2017-05-05 DIAGNOSIS — J449 Chronic obstructive pulmonary disease, unspecified: Secondary | ICD-10-CM | POA: Insufficient documentation

## 2017-05-05 DIAGNOSIS — S52121A Displaced fracture of head of right radius, initial encounter for closed fracture: Secondary | ICD-10-CM | POA: Diagnosis not present

## 2017-05-05 DIAGNOSIS — E039 Hypothyroidism, unspecified: Secondary | ICD-10-CM | POA: Diagnosis not present

## 2017-05-05 DIAGNOSIS — I10 Essential (primary) hypertension: Secondary | ICD-10-CM | POA: Diagnosis not present

## 2017-05-05 DIAGNOSIS — I739 Peripheral vascular disease, unspecified: Secondary | ICD-10-CM | POA: Diagnosis not present

## 2017-05-05 DIAGNOSIS — F419 Anxiety disorder, unspecified: Secondary | ICD-10-CM | POA: Insufficient documentation

## 2017-05-05 DIAGNOSIS — Z9889 Other specified postprocedural states: Secondary | ICD-10-CM

## 2017-05-05 DIAGNOSIS — Z853 Personal history of malignant neoplasm of breast: Secondary | ICD-10-CM | POA: Diagnosis not present

## 2017-05-05 HISTORY — PX: OPEN REDUCTION INTERNAL FIXATION (ORIF) DISTAL RADIAL FRACTURE: SHX5989

## 2017-05-05 LAB — POCT I-STAT 4, (NA,K, GLUC, HGB,HCT)
GLUCOSE: 237 mg/dL — AB (ref 65–99)
HEMATOCRIT: 26 % — AB (ref 36.0–46.0)
Hemoglobin: 8.8 g/dL — ABNORMAL LOW (ref 12.0–15.0)
Potassium: 4.1 mmol/L (ref 3.5–5.1)
Sodium: 136 mmol/L (ref 135–145)

## 2017-05-05 LAB — GLUCOSE, CAPILLARY
GLUCOSE-CAPILLARY: 229 mg/dL — AB (ref 65–99)
Glucose-Capillary: 249 mg/dL — ABNORMAL HIGH (ref 65–99)

## 2017-05-05 SURGERY — OPEN REDUCTION INTERNAL FIXATION (ORIF) DISTAL RADIUS FRACTURE
Anesthesia: General | Site: Arm Lower | Laterality: Right | Wound class: Clean

## 2017-05-05 MED ORDER — ONDANSETRON HCL 4 MG/2ML IJ SOLN
INTRAMUSCULAR | Status: AC
  Filled 2017-05-05: qty 2

## 2017-05-05 MED ORDER — NEOMYCIN-POLYMYXIN B GU 40-200000 IR SOLN
Status: AC
  Filled 2017-05-05: qty 2

## 2017-05-05 MED ORDER — DEXAMETHASONE SODIUM PHOSPHATE 10 MG/ML IJ SOLN
INTRAMUSCULAR | Status: AC
  Filled 2017-05-05: qty 1

## 2017-05-05 MED ORDER — ONDANSETRON HCL 4 MG PO TABS: 4.0000 mg | ORAL_TABLET | Freq: Four times a day (QID) | ORAL | Status: DC | PRN

## 2017-05-05 MED ORDER — FENTANYL CITRATE (PF) 100 MCG/2ML IJ SOLN
INTRAMUSCULAR | Status: DC | PRN
  Administered 2017-05-05: 50 ug via INTRAVENOUS
  Administered 2017-05-05 (×2): 25 ug via INTRAVENOUS

## 2017-05-05 MED ORDER — LACTATED RINGERS IV SOLN
INTRAVENOUS | Status: DC | PRN
  Administered 2017-05-05: 12:00:00 via INTRAVENOUS

## 2017-05-05 MED ORDER — LIDOCAINE HCL (CARDIAC) 20 MG/ML IV SOLN
INTRAVENOUS | Status: DC | PRN
  Administered 2017-05-05: 60 mg via INTRAVENOUS

## 2017-05-05 MED ORDER — LIDOCAINE HCL (PF) 2 % IJ SOLN
INTRAMUSCULAR | Status: AC
  Filled 2017-05-05: qty 10

## 2017-05-05 MED ORDER — SODIUM CHLORIDE 0.9 % IV SOLN: INTRAVENOUS | Status: DC

## 2017-05-05 MED ORDER — ONDANSETRON HCL 4 MG/2ML IJ SOLN: 4.0000 mg | Freq: Four times a day (QID) | INTRAMUSCULAR | Status: DC | PRN

## 2017-05-05 MED ORDER — FENTANYL CITRATE (PF) 100 MCG/2ML IJ SOLN
25.0000 ug | INTRAMUSCULAR | Status: DC | PRN
  Administered 2017-05-05 (×3): 25 ug via INTRAVENOUS

## 2017-05-05 MED ORDER — CLINDAMYCIN PHOSPHATE 900 MG/50ML IV SOLN
INTRAVENOUS | Status: DC | PRN
  Administered 2017-05-05: 900 mg via INTRAVENOUS

## 2017-05-05 MED ORDER — METOCLOPRAMIDE HCL 5 MG/ML IJ SOLN: 5.0000 mg | Freq: Three times a day (TID) | INTRAMUSCULAR | Status: DC | PRN

## 2017-05-05 MED ORDER — ONDANSETRON HCL 4 MG/2ML IJ SOLN: 4.0000 mg | Freq: Once | INTRAMUSCULAR | Status: DC | PRN

## 2017-05-05 MED ORDER — PROPOFOL 10 MG/ML IV BOLUS
INTRAVENOUS | Status: DC | PRN
  Administered 2017-05-05: 80 mg via INTRAVENOUS

## 2017-05-05 MED ORDER — DEXAMETHASONE SODIUM PHOSPHATE 10 MG/ML IJ SOLN
INTRAMUSCULAR | Status: DC | PRN
  Administered 2017-05-05: 5 mg via INTRAVENOUS

## 2017-05-05 MED ORDER — HYDROCODONE-ACETAMINOPHEN 5-325 MG PO TABS: 1.0000 | ORAL_TABLET | ORAL | Status: DC | PRN

## 2017-05-05 MED ORDER — PROPOFOL 10 MG/ML IV BOLUS
INTRAVENOUS | Status: AC
  Filled 2017-05-05: qty 20

## 2017-05-05 MED ORDER — FENTANYL CITRATE (PF) 100 MCG/2ML IJ SOLN
INTRAMUSCULAR | Status: AC
  Administered 2017-05-05: 25 ug via INTRAVENOUS
  Filled 2017-05-05: qty 2

## 2017-05-05 MED ORDER — SUGAMMADEX SODIUM 200 MG/2ML IV SOLN
INTRAVENOUS | Status: AC
  Filled 2017-05-05: qty 2

## 2017-05-05 MED ORDER — ONDANSETRON HCL 4 MG/2ML IJ SOLN
INTRAMUSCULAR | Status: DC | PRN
  Administered 2017-05-05: 4 mg via INTRAVENOUS

## 2017-05-05 MED ORDER — SUGAMMADEX SODIUM 200 MG/2ML IV SOLN
INTRAVENOUS | Status: DC | PRN
  Administered 2017-05-05: 150 mg via INTRAVENOUS

## 2017-05-05 MED ORDER — ROCURONIUM BROMIDE 100 MG/10ML IV SOLN
INTRAVENOUS | Status: DC | PRN
  Administered 2017-05-05: 30 mg via INTRAVENOUS

## 2017-05-05 MED ORDER — FENTANYL CITRATE (PF) 100 MCG/2ML IJ SOLN
INTRAMUSCULAR | Status: AC
  Filled 2017-05-05: qty 2

## 2017-05-05 MED ORDER — CLINDAMYCIN PHOSPHATE 900 MG/50ML IV SOLN
INTRAVENOUS | Status: AC
  Filled 2017-05-05: qty 50

## 2017-05-05 MED ORDER — METOCLOPRAMIDE HCL 10 MG PO TABS: 5.0000 mg | ORAL_TABLET | Freq: Three times a day (TID) | ORAL | Status: DC | PRN

## 2017-05-05 SURGICAL SUPPLY — 44 items
BANDAGE ACE 3X5.8 VEL STRL LF (GAUZE/BANDAGES/DRESSINGS) ×3 IMPLANT
BANDAGE ACE 4X5 VEL STRL LF (GAUZE/BANDAGES/DRESSINGS) IMPLANT
BIT DRILL 2 FAST STEP (BIT) ×3 IMPLANT
BIT DRILL 2.5X4 QC (BIT) ×3 IMPLANT
CANISTER SUCT 1200ML W/VALVE (MISCELLANEOUS) ×3 IMPLANT
CAST PADDING 3X4FT ST 30246 (SOFTGOODS) ×2
CHLORAPREP W/TINT 26ML (MISCELLANEOUS) ×3 IMPLANT
CUFF TOURN 18 STER (MISCELLANEOUS) IMPLANT
DRAPE FLUOR MINI C-ARM 54X84 (DRAPES) ×3 IMPLANT
ELECT CAUTERY BLADE 6.4 (BLADE) ×3 IMPLANT
ELECT REM PT RETURN 9FT ADLT (ELECTROSURGICAL) ×3
ELECTRODE REM PT RTRN 9FT ADLT (ELECTROSURGICAL) ×1 IMPLANT
GAUZE PETRO XEROFOAM 1X8 (MISCELLANEOUS) ×3 IMPLANT
GAUZE SPONGE 4X4 12PLY STRL (GAUZE/BANDAGES/DRESSINGS) ×3 IMPLANT
GLOVE SURG SYN 9.0  PF PI (GLOVE) ×2
GLOVE SURG SYN 9.0 PF PI (GLOVE) ×1 IMPLANT
GOWN SRG 2XL LVL 4 RGLN SLV (GOWNS) ×1 IMPLANT
GOWN STRL NON-REIN 2XL LVL4 (GOWNS) ×2
GOWN STRL REUS W/ TWL LRG LVL3 (GOWN DISPOSABLE) ×1 IMPLANT
GOWN STRL REUS W/TWL LRG LVL3 (GOWN DISPOSABLE) ×2
K-WIRE 1.6 (WIRE) ×2
K-WIRE FX5X1.6XNS BN SS (WIRE) ×1
KIT RM TURNOVER STRD PROC AR (KITS) ×3 IMPLANT
KWIRE FX5X1.6XNS BN SS (WIRE) ×1 IMPLANT
NEEDLE FILTER BLUNT 18X 1/2SAF (NEEDLE) ×2
NEEDLE FILTER BLUNT 18X1 1/2 (NEEDLE) ×1 IMPLANT
NS IRRIG 500ML POUR BTL (IV SOLUTION) ×3 IMPLANT
PACK EXTREMITY ARMC (MISCELLANEOUS) ×3 IMPLANT
PAD CAST CTTN 3X4 STRL (SOFTGOODS) ×1 IMPLANT
PAD CAST CTTN 4X4 STRL (SOFTGOODS) IMPLANT
PADDING CAST COTTON 4X4 STRL (SOFTGOODS)
PEG SUBCHONDRAL SMOOTH 2.0X16 (Peg) ×3 IMPLANT
PEG SUBCHONDRAL SMOOTH 2.0X18 (Peg) ×3 IMPLANT
PEG SUBCHONDRAL SMOOTH 2.0X20 (Peg) ×3 IMPLANT
PEG SUBCHONDRAL SMOOTH 2.0X22 (Peg) ×6 IMPLANT
PEG SUBCHONDRAL SMOOTH 2.0X24 (Peg) ×3 IMPLANT
PLATE STAN 21.6X57.2 NRRW RT (Plate) ×3 IMPLANT
SCREW CORT 3.5X10 LNG (Screw) ×9 IMPLANT
SPLINT CAST 1 STEP 3X12 (MISCELLANEOUS) ×3 IMPLANT
SUT ETHILON 4-0 (SUTURE) ×2
SUT ETHILON 4-0 FS2 18XMFL BLK (SUTURE) ×1
SUT VICRYL 3-0 27IN (SUTURE) ×3 IMPLANT
SUTURE ETHLN 4-0 FS2 18XMF BLK (SUTURE) ×1 IMPLANT
SYR 3ML LL SCALE MARK (SYRINGE) ×3 IMPLANT

## 2017-05-05 NOTE — Transfer of Care (Signed)
Immediate Anesthesia Transfer of Care Note  Patient: [name]  Procedure(s) Performed: Procedure(s): OPEN REDUCTION INTERNAL FIXATION (ORIF) DISTAL RADIAL FRACTURE (Right)  Patient Location: PACU  Anesthesia Type:General  Level of Consciousness: sedated  Airway & Oxygen Therapy: Patient Spontanous Breathing and Patient connected to face mask oxygen  Post-op Assessment: Report given to RN and Post -op Vital signs reviewed and stable  Post vital signs: Reviewed and stable  Last Vitals:  Vitals:   05/05/17 1038 05/05/17 1329  BP: (!) 117/59 (!) 156/81  Pulse: 75 85  Resp: 14 14  Temp: (!) 35.6 C 36.8 C  SpO2: 932% 355%    Complications: No apparent anesthesia complications

## 2017-05-05 NOTE — Anesthesia Postprocedure Evaluation (Signed)
Anesthesia Post Note  Patient: [employer]  Procedure(s) Performed: OPEN REDUCTION INTERNAL FIXATION (ORIF) DISTAL RADIAL FRACTURE (Right Arm Lower)  Patient location during evaluation: PACU Anesthesia Type: General Level of consciousness: awake and alert and oriented Pain management: pain level controlled Vital Signs Assessment: post-procedure vital signs reviewed and stable Respiratory status: spontaneous breathing Cardiovascular status: blood pressure returned to baseline Anesthetic complications: no     Last Vitals:  Vitals:   05/05/17 1451 05/05/17 1503  BP: 132/66 129/65  Pulse: 69 68  Resp: 14   Temp:    SpO2: 96% 97%    Last Pain:  Vitals:   05/05/17 1329  TempSrc:   PainSc: Asleep                 Oluwaseyi Raffel

## 2017-05-05 NOTE — Anesthesia Post-op Follow-up Note (Signed)
Anesthesia QCDR form completed.        

## 2017-05-05 NOTE — OR Nursing (Signed)
Right arm up on pillow fingers swollen

## 2017-05-05 NOTE — Op Note (Signed)
05/05/2017  1:33 PM  PATIENT:  [name] [name]  83 y.o. female  PRE-OPERATIVE DIAGNOSIS:  CLOSED FRACTURE OF DISTAL END OF RIGHT RADIUS, intra-articular displaced, 4 parts  POST-OPERATIVE DIAGNOSIS:  CLOSED FRACTURE OF DISTAL END OF RIGHT RADIUS same  PROCEDURE:  Procedure(s): OPEN REDUCTION INTERNAL FIXATION (ORIF) DISTAL RADIAL FRACTURE (Right)  SURGEON: Laurene Footman, MD  ASSISTANTS: None  ANESTHESIA:   general  EBL:  Total I/O In: 500 [I.V.:500] Out: -   BLOOD ADMINISTERED:none  DRAINS: none   LOCAL MEDICATIONS USED:  NONE  SPECIMEN:  No Specimen  DISPOSITION OF SPECIMEN:  N/A  COUNTS:  YES  TOURNIQUET:   17 minutes at 2 50 mmHg   IMPLANTS: Short narrow DVR plate with multiple smooth pegs and screws  DICTATION: .Dragon Dictation patient brought the operating room and after adequate anesthesia was obtained the right arm was prepped and draped in sterile fashion. After patient identification and timeout procedures were completed, tourniquet was raised. A volar approach is made centered over the FCR tendon the tendon was retracted radially. The deep fascia was incised and the pronator elevated off of the distal fragment. Traction was applied to the start of the case and this gave restoration of length and also improved the radial deviation. The short no DVR plate was then applied with distal first technique getting it very distally to the subchondral bone and distal pegs placed followed by placing the shaft screws this gave essentially anatomic alignment of the comminuted intra-articular fragments. Traction was released and under fluoroscopic views the fracture was stable. The wound was irrigated and then tourniquet let down. Wound closed with 3-0 Vicryl substantially and 4-0 nylon for the skin. Xeroform 4 x 4 web roll and volar splint applied followed by an Ace wrap  PLAN OF CARE: Discharge to home after PACU  PATIENT DISPOSITION:  PACU - hemodynamically stable.

## 2017-05-05 NOTE — Discharge Instructions (Addendum)
Keep arm elevated as much as possible. Work on finger motion is much as possible. Loosen Ace wrap if fingers swell. Keep dressing clean and dry     AMBULATORY SURGERY  DISCHARGE INSTRUCTIONS   1) The drugs that you were given will stay in your system until tomorrow so for the next 24 hours you should not:  A) Drive an automobile B) Make any legal decisions C) Drink any alcoholic beverage   2) You may resume regular meals tomorrow.  Today it is better to start with liquids and gradually work up to solid foods.  You may eat anything you prefer, but it is better to start with liquids, then soup and crackers, and gradually work up to solid foods.   3) Please notify your doctor immediately if you have any unusual bleeding, trouble breathing, redness and pain at the surgery site, drainage, fever, or pain not relieved by medication.    4) Additional Instructions:        Please contact your physician with any problems or Same Day Surgery at [phone], Monday through Friday 6 am to 4 pm, or [place] at [facility] number at [phone].

## 2017-05-05 NOTE — Anesthesia Preprocedure Evaluation (Addendum)
Anesthesia Evaluation  Patient identified by MRN, date of birth, ID band Patient awake    Reviewed: Allergy & Precautions, NPO status , Patient's Chart, lab work & pertinent test results  Airway Mallampati: II  TM Distance: <3 FB     Dental  (+) Chipped, Caps   Pulmonary sleep apnea , COPD,  COPD inhaler,    Pulmonary exam normal        Cardiovascular + CAD, + Past MI and + Peripheral Vascular Disease  Normal cardiovascular exam+ dysrhythmias Atrial Fibrillation      Neuro/Psych PSYCHIATRIC DISORDERS Anxiety CVA    GI/Hepatic GERD  Medicated and Controlled,  Endo/Other  diabetes, Well Controlled, Type 2, Oral Hypoglycemic AgentsHypothyroidism   Renal/GU Renal InsufficiencyRenal disease     Musculoskeletal   Abdominal Normal abdominal exam  (+)   Peds  Hematology negative hematology ROS (+) anemia ,   Anesthesia Other Findings Past Medical History: No date: Anxiety state, unspecified No date: CAD (coronary artery disease) No date: Cancer [facility])     Comment:  2004 Right breast, found on mammogram, radiation               therapy, Dr. Bryson Ha, uterine cancer,  No date: COPD (chronic obstructive pulmonary disease) ([place]) No date: Diabetes mellitus No date: Esophageal reflux No date: Heart burn No date: Hyperlipidemia No date: IBS (irritable bowel syndrome) No date: Myocardial infarction [facility])     Comment:  Cath negative except for 40% occlusion LAD.  Pt not               candidate for betablocker or ACEI because of hypotension No date: OSA (obstructive sleep apnea) 02/21/09: Osteoporosis     Comment:  DEXA scan showed osteoporosis with left femur T-score               -2.8. 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 No date: Stroke [facility]) No date: Thyroid disease     Comment:  Hypothyroid No date: Vitamin D deficiency  Reproductive/Obstetrics                            Anesthesia Physical Anesthesia  Plan  ASA: IV  Anesthesia Plan: General   Post-op Pain Management:    Induction: Intravenous  PONV Risk Score and Plan:   Airway Management Planned: Oral ETT  Additional Equipment:   Intra-op Plan:   Post-operative Plan: Extubation in OR  Informed Consent: I have reviewed the patients History and Physical, chart, labs and discussed the procedure including the risks, benefits and alternatives for the proposed anesthesia with the patient or authorized representative who has indicated his/her understanding and acceptance.   Dental advisory given  Plan Discussed with: CRNA and Surgeon  Anesthesia Plan Comments:         Anesthesia Quick Evaluation

## 2017-05-05 NOTE — Anesthesia Procedure Notes (Signed)
Procedure Name: Intubation Date/Time: 05/05/2017 12:35 PM Performed by: Doreen Salvage Pre-anesthesia Checklist: Patient identified, Patient being monitored, Timeout performed, Emergency Drugs available and Suction available Patient Re-evaluated:Patient Re-evaluated prior to induction Oxygen Delivery Method: Circle system utilized Preoxygenation: Pre-oxygenation with 100% oxygen Induction Type: IV induction Ventilation: Mask ventilation without difficulty Laryngoscope Size: Mac and 3 Grade View: Grade I Tube type: Oral Tube size: 7.0 mm Number of attempts: 1 Airway Equipment and Method: Stylet Placement Confirmation: ETT inserted through vocal cords under direct vision,  positive ETCO2 and breath sounds checked- equal and bilateral Secured at: 21 cm Tube secured with: Tape Dental Injury: Teeth and Oropharynx as per pre-operative assessment

## 2017-05-05 NOTE — H&P (Signed)
Reviewed paper H+P, will be scanned into chart. No changes noted.  

## 2017-05-05 NOTE — OR Nursing (Signed)
[facility] called. Report given. Pt transported to [facility] via personal wheelchair.

## 2017-05-06 ENCOUNTER — Encounter: Payer: Self-pay | Admitting: Orthopedic Surgery

## 2017-05-07 ENCOUNTER — Other Ambulatory Visit: Payer: Self-pay | Admitting: *Deleted

## 2017-05-07 ENCOUNTER — Encounter: Payer: Self-pay | Admitting: Orthopedic Surgery

## 2017-05-07 NOTE — Patient Outreach (Addendum)
[facility]) Care Management  [facility] Social Work  05/07/2017  [name] [name] Dec 07, 1931 [id number]  Subjective:  Patient is a 83 year old female currently in [place] at [facility]. Patient was asleep when this social worker arrived. Patient's daughter [name] [name] was present when this social worker arrived and stated that patient  was given a pain pill after her therapy treatment and had been asleep for the last couple of hours. This social worker was able to confirm with the discharge planner Milana Kidney that they are pursuing long term care for patient at [facility]. Per Jonelle Sidle, [facility] is scheduled to assess her on 05/08/17. Bed offer pending assessment.   Objective:   Encounter Medications:  Outpatient Encounter Prescriptions as of 05/07/2017  Medication Sig Note  . aspirin EC 81 MG tablet Take 1 tablet (81 mg total) by mouth daily.   Marland Kitchen atorvastatin (LIPITOR) 80 MG tablet Take 1 tablet (80 mg total) by mouth daily.   . clopidogrel (PLAVIX) 75 MG tablet TAKE 1 TABLET(75 MG) BY MOUTH DAILY   . fluticasone (FLONASE) 50 MCG/ACT nasal spray Place 2 sprays into both nostrils daily.   . insulin aspart (NOVOLOG) 100 UNIT/ML injection Inject 3 Units into the skin 3 (three) times daily with meals. (Patient taking differently: Inject 0-10 Units into the skin 3 (three) times daily with meals. 150-200= 3 UNITS 201-250= 5 UNITS 251-300= 7 UNITS 301-350= 8 UNITS 351-400= 10 UNITS) 05/05/2017: Nurse from [facility] report patient had no insulin last night and none today.  . insulin aspart (NOVOLOG) 100 UNIT/ML injection Inject 0-5 Units into the skin at bedtime. (Patient not taking: Reported on 04/26/2017)   . insulin aspart (NOVOLOG) 100 UNIT/ML injection Inject 0-9 Units into the skin 3 (three) times daily with meals. (Patient not taking: Reported on 04/29/2017)   . insulin glargine (LANTUS) 100 UNIT/ML injection Inject 0.15 mLs (15 Units total) into the skin  daily. (Patient taking differently: Inject 23 Units into the skin at bedtime. )   . levothyroxine (SYNTHROID, LEVOTHROID) 100 MCG tablet TAKE 1 TABLET(100 MCG) BY MOUTH DAILY   . loperamide (IMODIUM) 2 MG capsule Take 4 mg by mouth as needed for diarrhea or loose stools.   . metoCLOPramide (REGLAN) 5 MG tablet Take 5 mg by mouth 4 (four) times daily -  before meals and at bedtime.    . midodrine (PROAMATINE) 10 MG tablet Take 1 tablet (10 mg total) by mouth 3 (three) times daily with meals.   Marland Kitchen oxyCODONE (ROXICODONE) 5 MG immediate release tablet Take 0.5 tablets (2.5 mg total) by mouth every 4 (four) hours as needed.   . pantoprazole (PROTONIX) 40 MG tablet Take 1 tablet (40 mg total) by mouth 2 (two) times daily.   . sennosides-docusate sodium (SENOKOT-S) 8.6-50 MG tablet Take 2 tablets by mouth daily.   . sertraline (ZOLOFT) 25 MG tablet Take 1 tablet (25 mg total) by mouth daily after breakfast.    No facility-administered encounter medications on file as of 05/07/2017.     Functional Status:  In your present state of health, do you have any difficulty performing the following activities: 04/15/2017 04/05/2017  Hearing? Tempie Donning  Vision? N N  Difficulty concentrating or making decisions? Tempie Donning  Walking or climbing stairs? Y N  Dressing or bathing? Y Y  Doing errands, shopping? Y Y  Preparing Food and eating ? - -  Comment - -  Using the Toilet? - -  In the  past six months, have you accidently leaked urine? - -  Do you have problems with loss of bowel control? - -  Managing your Medications? - -  Managing your Finances? - -  Comment - -  Housekeeping or managing your Housekeeping? - -  Comment - -  Some recent data might be hidden    Fall/Depression Screening:  PHQ 2/9 Scores 12/15/2016 11/27/2016 08/15/2016 04/01/2016 01/04/2016 08/15/2014 07/26/2012  PHQ - 2 Score 1 3 0 0 0 0 0  PHQ- 9 Score - 8 - - - - -    Assessment: Patient asleep when this social worker arrived and could not be  aroused.  Patient's tentative discharge plan is to transition to ALF placement-[facility] once stable for discharge.  Discharge date has not been set yet.  Plan: This social worker to follow up with the  discharge planner Milana Kidney [phone] following patient's assessment by [facility].   Sheralyn Boatman [facility] Care Management [phone]

## 2017-05-12 ENCOUNTER — Other Ambulatory Visit: Payer: Self-pay | Admitting: *Deleted

## 2017-05-12 NOTE — Patient Outreach (Addendum)
[facility]) Care Management  05/12/2017  [name] [name] 07-15-1932 [id number]   Phone call to [place] discharge planner to follow up on patient's interview with the ALF. Left messge for a return call as she was not in.    Sheralyn Boatman [facility] Care Management [phone]

## 2017-05-13 ENCOUNTER — Other Ambulatory Visit: Payer: Self-pay | Admitting: *Deleted

## 2017-05-13 NOTE — Patient Outreach (Signed)
[facility]) Care Management  05/13/2017  [name] [name] Dec 11, 1931 [id number]   Phone call on 05/12/17 from the discharge planner Milana Kidney at [facility] who confirmed that patient has received  Bed offer from [facility]. The plan is for her to discharge there for long term care once stable for discharge.    Plan: This Education officer, museum will inform patient's provider of case closure.           RNCM Kathie Rhodes and Post Acute care coordinator Burgess Amor to be notified as well.    Sheralyn Boatman [facility] Care Management [phone]

## 2017-05-15 ENCOUNTER — Other Ambulatory Visit: Payer: Self-pay | Admitting: *Deleted

## 2017-05-15 NOTE — Patient Outreach (Signed)
In basket received from [place], informed pt to discharge from [facility] when stable to [facility] for long term care.   RN CM to end episode.      [name]   [facility] Care Management  [phone]

## 2017-05-19 ENCOUNTER — Encounter: Payer: Self-pay | Admitting: Emergency Medicine

## 2017-05-19 ENCOUNTER — Inpatient Hospital Stay
Admission: EM | Admit: 2017-05-19 | Discharge: 2017-05-21 | DRG: 372 | Payer: Medicare Other | Attending: Internal Medicine | Admitting: Internal Medicine

## 2017-05-19 ENCOUNTER — Emergency Department: Payer: Medicare Other

## 2017-05-19 DIAGNOSIS — W19XXXA Unspecified fall, initial encounter: Secondary | ICD-10-CM | POA: Diagnosis not present

## 2017-05-19 DIAGNOSIS — Z833 Family history of diabetes mellitus: Secondary | ICD-10-CM

## 2017-05-19 DIAGNOSIS — K219 Gastro-esophageal reflux disease without esophagitis: Secondary | ICD-10-CM | POA: Diagnosis not present

## 2017-05-19 DIAGNOSIS — Z794 Long term (current) use of insulin: Secondary | ICD-10-CM | POA: Diagnosis not present

## 2017-05-19 DIAGNOSIS — Z8673 Personal history of transient ischemic attack (TIA), and cerebral infarction without residual deficits: Secondary | ICD-10-CM | POA: Diagnosis not present

## 2017-05-19 DIAGNOSIS — S0990XA Unspecified injury of head, initial encounter: Secondary | ICD-10-CM | POA: Diagnosis not present

## 2017-05-19 DIAGNOSIS — Z90710 Acquired absence of both cervix and uterus: Secondary | ICD-10-CM | POA: Diagnosis not present

## 2017-05-19 DIAGNOSIS — Y92122 Bedroom in nursing home as the place of occurrence of the external cause: Secondary | ICD-10-CM

## 2017-05-19 DIAGNOSIS — E1165 Type 2 diabetes mellitus with hyperglycemia: Secondary | ICD-10-CM | POA: Diagnosis not present

## 2017-05-19 DIAGNOSIS — Z7951 Long term (current) use of inhaled steroids: Secondary | ICD-10-CM | POA: Diagnosis not present

## 2017-05-19 DIAGNOSIS — I10 Essential (primary) hypertension: Secondary | ICD-10-CM | POA: Diagnosis not present

## 2017-05-19 DIAGNOSIS — S52201D Unspecified fracture of shaft of right ulna, subsequent encounter for closed fracture with routine healing: Secondary | ICD-10-CM | POA: Diagnosis not present

## 2017-05-19 DIAGNOSIS — J449 Chronic obstructive pulmonary disease, unspecified: Secondary | ICD-10-CM | POA: Diagnosis not present

## 2017-05-19 DIAGNOSIS — E119 Type 2 diabetes mellitus without complications: Secondary | ICD-10-CM | POA: Diagnosis present

## 2017-05-19 DIAGNOSIS — S0083XA Contusion of other part of head, initial encounter: Secondary | ICD-10-CM | POA: Diagnosis present

## 2017-05-19 DIAGNOSIS — W06XXXA Fall from bed, initial encounter: Secondary | ICD-10-CM | POA: Diagnosis not present

## 2017-05-19 DIAGNOSIS — K7689 Other specified diseases of liver: Secondary | ICD-10-CM | POA: Diagnosis present

## 2017-05-19 DIAGNOSIS — Z8542 Personal history of malignant neoplasm of other parts of uterus: Secondary | ICD-10-CM | POA: Diagnosis not present

## 2017-05-19 DIAGNOSIS — G4730 Sleep apnea, unspecified: Secondary | ICD-10-CM | POA: Diagnosis not present

## 2017-05-19 DIAGNOSIS — Z7982 Long term (current) use of aspirin: Secondary | ICD-10-CM

## 2017-05-19 DIAGNOSIS — E039 Hypothyroidism, unspecified: Secondary | ICD-10-CM | POA: Diagnosis present

## 2017-05-19 DIAGNOSIS — S199XXA Unspecified injury of neck, initial encounter: Secondary | ICD-10-CM | POA: Diagnosis not present

## 2017-05-19 DIAGNOSIS — E785 Hyperlipidemia, unspecified: Secondary | ICD-10-CM | POA: Diagnosis not present

## 2017-05-19 DIAGNOSIS — A0472 Enterocolitis due to Clostridium difficile, not specified as recurrent: Secondary | ICD-10-CM | POA: Diagnosis not present

## 2017-05-19 DIAGNOSIS — Z9181 History of falling: Secondary | ICD-10-CM | POA: Diagnosis not present

## 2017-05-19 DIAGNOSIS — Z79899 Other long term (current) drug therapy: Secondary | ICD-10-CM

## 2017-05-19 DIAGNOSIS — I2510 Atherosclerotic heart disease of native coronary artery without angina pectoris: Secondary | ICD-10-CM | POA: Diagnosis not present

## 2017-05-19 DIAGNOSIS — M810 Age-related osteoporosis without current pathological fracture: Secondary | ICD-10-CM | POA: Diagnosis not present

## 2017-05-19 DIAGNOSIS — S0993XA Unspecified injury of face, initial encounter: Secondary | ICD-10-CM | POA: Diagnosis not present

## 2017-05-19 DIAGNOSIS — Z8249 Family history of ischemic heart disease and other diseases of the circulatory system: Secondary | ICD-10-CM | POA: Diagnosis not present

## 2017-05-19 DIAGNOSIS — I6782 Cerebral ischemia: Secondary | ICD-10-CM | POA: Diagnosis present

## 2017-05-19 DIAGNOSIS — S299XXA Unspecified injury of thorax, initial encounter: Secondary | ICD-10-CM | POA: Diagnosis not present

## 2017-05-19 DIAGNOSIS — Z853 Personal history of malignant neoplasm of breast: Secondary | ICD-10-CM | POA: Diagnosis not present

## 2017-05-19 NOTE — ED Triage Notes (Signed)
Pt arrived to ED from [facility] after rolling out of bed and hitting right side of face on floor. Pt has hematoma to the right cheek, as well as bruising. Pt has HX of dementia and was released from hospital with diarrhea earlier this month. Pt has strong smell of feces. MD at bedside.

## 2017-05-20 ENCOUNTER — Emergency Department: Payer: Medicare Other

## 2017-05-20 DIAGNOSIS — Z7982 Long term (current) use of aspirin: Secondary | ICD-10-CM | POA: Diagnosis not present

## 2017-05-20 DIAGNOSIS — E119 Type 2 diabetes mellitus without complications: Secondary | ICD-10-CM | POA: Diagnosis not present

## 2017-05-20 DIAGNOSIS — Z7951 Long term (current) use of inhaled steroids: Secondary | ICD-10-CM | POA: Diagnosis not present

## 2017-05-20 DIAGNOSIS — K219 Gastro-esophageal reflux disease without esophagitis: Secondary | ICD-10-CM | POA: Diagnosis not present

## 2017-05-20 DIAGNOSIS — W06XXXA Fall from bed, initial encounter: Secondary | ICD-10-CM | POA: Diagnosis not present

## 2017-05-20 DIAGNOSIS — Z833 Family history of diabetes mellitus: Secondary | ICD-10-CM | POA: Diagnosis not present

## 2017-05-20 DIAGNOSIS — S0993XA Unspecified injury of face, initial encounter: Secondary | ICD-10-CM | POA: Diagnosis not present

## 2017-05-20 DIAGNOSIS — S299XXA Unspecified injury of thorax, initial encounter: Secondary | ICD-10-CM | POA: Diagnosis not present

## 2017-05-20 DIAGNOSIS — W19XXXA Unspecified fall, initial encounter: Secondary | ICD-10-CM | POA: Diagnosis not present

## 2017-05-20 DIAGNOSIS — Z90710 Acquired absence of both cervix and uterus: Secondary | ICD-10-CM | POA: Diagnosis not present

## 2017-05-20 DIAGNOSIS — K7689 Other specified diseases of liver: Secondary | ICD-10-CM | POA: Diagnosis not present

## 2017-05-20 DIAGNOSIS — Y92122 Bedroom in nursing home as the place of occurrence of the external cause: Secondary | ICD-10-CM | POA: Diagnosis not present

## 2017-05-20 DIAGNOSIS — E785 Hyperlipidemia, unspecified: Secondary | ICD-10-CM | POA: Diagnosis not present

## 2017-05-20 DIAGNOSIS — S0990XA Unspecified injury of head, initial encounter: Secondary | ICD-10-CM | POA: Diagnosis not present

## 2017-05-20 DIAGNOSIS — Z8542 Personal history of malignant neoplasm of other parts of uterus: Secondary | ICD-10-CM | POA: Diagnosis not present

## 2017-05-20 DIAGNOSIS — Z8673 Personal history of transient ischemic attack (TIA), and cerebral infarction without residual deficits: Secondary | ICD-10-CM | POA: Diagnosis not present

## 2017-05-20 DIAGNOSIS — N179 Acute kidney failure, unspecified: Secondary | ICD-10-CM | POA: Diagnosis not present

## 2017-05-20 DIAGNOSIS — Z794 Long term (current) use of insulin: Secondary | ICD-10-CM | POA: Diagnosis not present

## 2017-05-20 DIAGNOSIS — S199XXA Unspecified injury of neck, initial encounter: Secondary | ICD-10-CM | POA: Diagnosis not present

## 2017-05-20 DIAGNOSIS — A0472 Enterocolitis due to Clostridium difficile, not specified as recurrent: Secondary | ICD-10-CM | POA: Diagnosis present

## 2017-05-20 DIAGNOSIS — E039 Hypothyroidism, unspecified: Secondary | ICD-10-CM | POA: Diagnosis not present

## 2017-05-20 DIAGNOSIS — Z79899 Other long term (current) drug therapy: Secondary | ICD-10-CM | POA: Diagnosis not present

## 2017-05-20 DIAGNOSIS — S0083XA Contusion of other part of head, initial encounter: Secondary | ICD-10-CM | POA: Diagnosis not present

## 2017-05-20 DIAGNOSIS — Z8249 Family history of ischemic heart disease and other diseases of the circulatory system: Secondary | ICD-10-CM | POA: Diagnosis not present

## 2017-05-20 LAB — URINALYSIS, COMPLETE (UACMP) WITH MICROSCOPIC
Bilirubin Urine: NEGATIVE
Glucose, UA: >=500 mg/dL — AB
Hgb urine dipstick: NEGATIVE
Ketones, ur: NEGATIVE mg/dL
Nitrite: POSITIVE — AB
Protein, ur: NEGATIVE mg/dL
Specific Gravity, Urine: 1.023 (ref 1.005–1.030)
Squamous Epithelial / LPF: NONE SEEN
pH: 6 (ref 5.0–8.0)

## 2017-05-20 LAB — GASTROINTESTINAL PANEL BY PCR, STOOL (REPLACES STOOL CULTURE)
ADENOVIRUS F40/41: NOT DETECTED
ASTROVIRUS: NOT DETECTED
Campylobacter species: NOT DETECTED
Cryptosporidium: NOT DETECTED
Cyclospora cayetanensis: NOT DETECTED
ENTAMOEBA HISTOLYTICA: NOT DETECTED
ENTEROAGGREGATIVE E COLI (EAEC): NOT DETECTED
ENTEROTOXIGENIC E COLI (ETEC): NOT DETECTED
Enteropathogenic E coli (EPEC): NOT DETECTED
GIARDIA LAMBLIA: NOT DETECTED
NOROVIRUS GI/GII: NOT DETECTED
Plesimonas shigelloides: NOT DETECTED
Rotavirus A: NOT DETECTED
Salmonella species: NOT DETECTED
Sapovirus (I, II, IV, and V): NOT DETECTED
Shiga like toxin producing E coli (STEC): NOT DETECTED
Shigella/Enteroinvasive E coli (EIEC): NOT DETECTED
VIBRIO CHOLERAE: NOT DETECTED
Vibrio species: NOT DETECTED
Yersinia enterocolitica: NOT DETECTED

## 2017-05-20 LAB — GLUCOSE, CAPILLARY
Glucose-Capillary: 150 mg/dL — ABNORMAL HIGH (ref 65–99)
Glucose-Capillary: 163 mg/dL — ABNORMAL HIGH (ref 65–99)
Glucose-Capillary: 290 mg/dL — ABNORMAL HIGH (ref 65–99)
Glucose-Capillary: 368 mg/dL — ABNORMAL HIGH (ref 65–99)
Glucose-Capillary: 203 mg/dL — ABNORMAL HIGH (ref 65–99)

## 2017-05-20 LAB — C DIFFICILE QUICK SCREEN W PCR REFLEX
C DIFFICLE (CDIFF) ANTIGEN: POSITIVE — AB
C Diff toxin: NEGATIVE

## 2017-05-20 LAB — BASIC METABOLIC PANEL
Anion gap: 11 (ref 5–15)
BUN: 18 mg/dL (ref 6–20)
CHLORIDE: 99 mmol/L — AB (ref 101–111)
CO2: 21 mmol/L — AB (ref 22–32)
CREATININE: 1.14 mg/dL — AB (ref 0.44–1.00)
Calcium: 8.9 mg/dL (ref 8.9–10.3)
GFR calc Af Amer: 50 mL/min — ABNORMAL LOW (ref >60)
GFR calc non Af Amer: 43 mL/min — ABNORMAL LOW (ref >60)
Glucose, Bld: 529 mg/dL (ref 65–99)
Potassium: 3.7 mmol/L (ref 3.5–5.1)
SODIUM: 131 mmol/L — AB (ref 135–145)

## 2017-05-20 LAB — CBC
HCT: 28.5 % — ABNORMAL LOW (ref 35.0–47.0)
Hemoglobin: 9.4 g/dL — ABNORMAL LOW (ref 12.0–16.0)
MCH: 32.1 pg (ref 26.0–34.0)
MCHC: 32.9 g/dL (ref 32.0–36.0)
MCV: 97.5 fL (ref 80.0–100.0)
PLATELETS: 346 10*3/uL (ref 150–440)
RBC: 2.93 MIL/uL — ABNORMAL LOW (ref 3.80–5.20)
RDW: 17.8 % — AB (ref 11.5–14.5)
WBC: 5.4 10*3/uL (ref 3.6–11.0)

## 2017-05-20 LAB — TROPONIN I: Troponin I: <0.03 ng/mL (ref <0.03)

## 2017-05-20 LAB — MRSA PCR SCREENING: MRSA by PCR: NEGATIVE

## 2017-05-20 LAB — CLOSTRIDIUM DIFFICILE BY PCR: Toxigenic C. Difficile by PCR: NEGATIVE

## 2017-05-20 LAB — TSH: TSH: 0.456 u[IU]/mL (ref 0.350–4.500)

## 2017-05-20 MED ORDER — LOPERAMIDE HCL 2 MG PO CAPS: 4.0000 mg | ORAL_CAPSULE | ORAL | Status: DC | PRN

## 2017-05-20 MED ORDER — DOCUSATE SODIUM 100 MG PO CAPS
100.0000 mg | ORAL_CAPSULE | Freq: Two times a day (BID) | ORAL | Status: DC
  Administered 2017-05-20: 100 mg via ORAL
  Filled 2017-05-20 (×2): qty 1

## 2017-05-20 MED ORDER — METOCLOPRAMIDE HCL 5 MG PO TABS
5.0000 mg | ORAL_TABLET | Freq: Three times a day (TID) | ORAL | Status: DC
  Administered 2017-05-20 – 2017-05-21 (×6): 5 mg via ORAL
  Filled 2017-05-20 (×7): qty 1

## 2017-05-20 MED ORDER — PANTOPRAZOLE SODIUM 40 MG PO TBEC
40.0000 mg | DELAYED_RELEASE_TABLET | Freq: Two times a day (BID) | ORAL | Status: DC
  Administered 2017-05-20: 40 mg via ORAL
  Filled 2017-05-20: qty 1

## 2017-05-20 MED ORDER — SODIUM CHLORIDE 0.9 % IV BOLUS (SEPSIS)
1000.0000 mL | Freq: Once | INTRAVENOUS | Status: AC
  Administered 2017-05-20: 1000 mL via INTRAVENOUS

## 2017-05-20 MED ORDER — INSULIN GLARGINE 100 UNIT/ML ~~LOC~~ SOLN
10.0000 [IU] | Freq: Every day | SUBCUTANEOUS | Status: DC
  Administered 2017-05-20: 10 [IU] via SUBCUTANEOUS
  Filled 2017-05-20 (×2): qty 0.1

## 2017-05-20 MED ORDER — ATORVASTATIN CALCIUM 20 MG PO TABS
80.0000 mg | ORAL_TABLET | Freq: Every day | ORAL | Status: DC
  Administered 2017-05-20: 17:00:00 80 mg via ORAL
  Filled 2017-05-20: qty 4

## 2017-05-20 MED ORDER — CLOPIDOGREL BISULFATE 75 MG PO TABS
75.0000 mg | ORAL_TABLET | Freq: Every day | ORAL | Status: DC
  Administered 2017-05-20 – 2017-05-21 (×2): 75 mg via ORAL
  Filled 2017-05-20 (×2): qty 1

## 2017-05-20 MED ORDER — ONDANSETRON HCL 4 MG/2ML IJ SOLN
4.0000 mg | Freq: Four times a day (QID) | INTRAMUSCULAR | Status: DC | PRN
  Administered 2017-05-20: 4 mg via INTRAVENOUS
  Filled 2017-05-20: qty 2

## 2017-05-20 MED ORDER — ACETAMINOPHEN 325 MG PO TABS
650.0000 mg | ORAL_TABLET | Freq: Four times a day (QID) | ORAL | Status: DC | PRN
  Administered 2017-05-20 – 2017-05-21 (×2): 650 mg via ORAL
  Filled 2017-05-20 (×2): qty 2

## 2017-05-20 MED ORDER — LEVOTHYROXINE SODIUM 100 MCG PO TABS
100.0000 ug | ORAL_TABLET | Freq: Every day | ORAL | Status: DC
  Administered 2017-05-20 – 2017-05-21 (×2): 100 ug via ORAL
  Filled 2017-05-20 (×2): qty 1

## 2017-05-20 MED ORDER — MORPHINE SULFATE (PF) 2 MG/ML IV SOLN
INTRAVENOUS | Status: AC
  Administered 2017-05-20: 2 mg via INTRAVENOUS
  Filled 2017-05-20: qty 1

## 2017-05-20 MED ORDER — SENNOSIDES-DOCUSATE SODIUM 8.6-50 MG PO TABS
2.0000 | ORAL_TABLET | Freq: Every day | ORAL | Status: DC
  Administered 2017-05-20: 2 via ORAL
  Filled 2017-05-20: qty 2

## 2017-05-20 MED ORDER — ONDANSETRON HCL 4 MG/2ML IJ SOLN
4.0000 mg | Freq: Once | INTRAMUSCULAR | Status: AC
  Administered 2017-05-20: 4 mg via INTRAVENOUS

## 2017-05-20 MED ORDER — INSULIN ASPART 100 UNIT/ML ~~LOC~~ SOLN
0.0000 [IU] | Freq: Three times a day (TID) | SUBCUTANEOUS | Status: DC
  Administered 2017-05-20: 12:00:00 5 [IU] via SUBCUTANEOUS
  Administered 2017-05-20: 2 [IU] via SUBCUTANEOUS
  Administered 2017-05-20: 9 [IU] via SUBCUTANEOUS
  Administered 2017-05-21: 3 [IU] via SUBCUTANEOUS
  Filled 2017-05-20 (×4): qty 1

## 2017-05-20 MED ORDER — ONDANSETRON HCL 4 MG/2ML IJ SOLN
INTRAMUSCULAR | Status: AC
  Administered 2017-05-20: 4 mg via INTRAVENOUS
  Filled 2017-05-20: qty 2

## 2017-05-20 MED ORDER — MIDODRINE HCL 5 MG PO TABS
10.0000 mg | ORAL_TABLET | Freq: Three times a day (TID) | ORAL | Status: DC
  Administered 2017-05-20 – 2017-05-21 (×5): 10 mg via ORAL
  Filled 2017-05-20 (×7): qty 2

## 2017-05-20 MED ORDER — SODIUM CHLORIDE 0.9 % IV SOLN
INTRAVENOUS | Status: DC
  Administered 2017-05-20 – 2017-05-21 (×3): via INTRAVENOUS

## 2017-05-20 MED ORDER — OXYCODONE HCL 5 MG PO TABS: 2.5000 mg | ORAL_TABLET | ORAL | Status: DC | PRN

## 2017-05-20 MED ORDER — INSULIN ASPART 100 UNIT/ML ~~LOC~~ SOLN
8.0000 [IU] | Freq: Once | SUBCUTANEOUS | Status: AC
  Administered 2017-05-20: 8 [IU] via INTRAVENOUS
  Filled 2017-05-20: qty 1

## 2017-05-20 MED ORDER — ACETAMINOPHEN 650 MG RE SUPP: 650.0000 mg | Freq: Four times a day (QID) | RECTAL | Status: DC | PRN

## 2017-05-20 MED ORDER — ASPIRIN EC 81 MG PO TBEC
81.0000 mg | DELAYED_RELEASE_TABLET | Freq: Every day | ORAL | Status: DC
  Administered 2017-05-20 – 2017-05-21 (×2): 81 mg via ORAL
  Filled 2017-05-20 (×2): qty 1

## 2017-05-20 MED ORDER — METRONIDAZOLE 500 MG PO TABS
500.0000 mg | ORAL_TABLET | Freq: Once | ORAL | Status: AC
  Administered 2017-05-20: 500 mg via ORAL
  Filled 2017-05-20: qty 1

## 2017-05-20 MED ORDER — HEPARIN SODIUM (PORCINE) 5000 UNIT/ML IJ SOLN
5000.0000 [IU] | Freq: Three times a day (TID) | INTRAMUSCULAR | Status: DC
  Administered 2017-05-20 – 2017-05-21 (×5): 5000 [IU] via SUBCUTANEOUS
  Filled 2017-05-20 (×5): qty 1

## 2017-05-20 MED ORDER — ONDANSETRON HCL 4 MG PO TABS: 4.0000 mg | ORAL_TABLET | Freq: Four times a day (QID) | ORAL | Status: DC | PRN

## 2017-05-20 MED ORDER — METRONIDAZOLE 500 MG PO TABS
500.0000 mg | ORAL_TABLET | Freq: Three times a day (TID) | ORAL | Status: DC
  Administered 2017-05-20 – 2017-05-21 (×5): 500 mg via ORAL
  Filled 2017-05-20 (×5): qty 1

## 2017-05-20 MED ORDER — ENOXAPARIN SODIUM 40 MG/0.4ML ~~LOC~~ SOLN: 40.0000 mg | SUBCUTANEOUS | Status: DC

## 2017-05-20 MED ORDER — FAMOTIDINE 20 MG PO TABS
20.0000 mg | ORAL_TABLET | ORAL | Status: DC
  Administered 2017-05-21: 20 mg via ORAL
  Filled 2017-05-20: qty 1

## 2017-05-20 MED ORDER — SERTRALINE HCL 50 MG PO TABS
25.0000 mg | ORAL_TABLET | Freq: Every day | ORAL | Status: DC
  Administered 2017-05-20 – 2017-05-21 (×2): 25 mg via ORAL
  Filled 2017-05-20 (×2): qty 1

## 2017-05-20 MED ORDER — MORPHINE SULFATE (PF) 2 MG/ML IV SOLN
2.0000 mg | Freq: Once | INTRAVENOUS | Status: AC
  Administered 2017-05-20: 2 mg via INTRAVENOUS

## 2017-05-20 MED ORDER — FLUTICASONE PROPIONATE 50 MCG/ACT NA SUSP
2.0000 | Freq: Every day | NASAL | Status: DC
  Administered 2017-05-20 – 2017-05-21 (×2): 2 via NASAL
  Filled 2017-05-20: qty 16

## 2017-05-20 NOTE — ED Provider Notes (Signed)
[facility] Emergency Department Provider Note   First MD Initiated Contact with Patient 05/19/17 2335     (approximate)  I have reviewed the triage vital signs and the nursing notes.  Level V caveat: History Limited secondary to dementia.  HISTORY  Chief Complaint Fall   HPI [name] [name] is a 83 y.o. female with below list of chronic medical conditions presents to the emergency department via EMS from [place] with history of rolling out of bed with resultant right facial contusion and swelling.patient does not recall injury. Patient states "the onlyplace that hurts is my face". EMS states that they were notified by staff that the patient has had loose stools tonight.   Past Medical History:  Diagnosis Date  . Anxiety state, unspecified   . CAD (coronary artery disease)   . Cancer [facility])    2004 Right breast, found on mammogram, radiation therapy, Dr. Bryson Ha, uterine cancer,   . COPD (chronic obstructive pulmonary disease) ([place])   . Diabetes mellitus   . Esophageal reflux   . Heart burn   . Hyperlipidemia   . IBS (irritable bowel syndrome)   . Myocardial infarction [facility])    Cath negative except for 40% occlusion LAD.  Pt not candidate for betablocker or ACEI because of hypotension  . OSA (obstructive sleep apnea)   . Osteoporosis 02/21/09   DEXA scan showed osteoporosis with left femur T-score -2.8.  Marland Kitchen Presbyacusis   . Stroke ([place])   . Thyroid disease    Hypothyroid  . Vitamin D deficiency     Patient Active Problem List   Diagnosis Date Noted  . C. difficile colitis 05/20/2017  . Hyperglycemia   . Acute kidney injury ([place])   . Metabolic encephalopathy 70/62/3762  . Adjustment disorder with mixed anxiety and depressed mood   . Coffee ground emesis 02/12/2017  . Acute on chronic renal failure ([place]) 01/13/2017  . Dementia, vascular 12/03/2016  . Stroke ([place]) 08/20/2016  . Peripheral vascular disease ([place]) 04/09/2016    . DKA (diabetic ketoacidoses) ([place]) 06/08/2015  . CKD stage 3 due to type 2 diabetes mellitus ([place]) 02/22/2014  . Hypothyroidism 09/23/2012  . GERD (gastroesophageal reflux disease) 09/23/2012  . Anxiety 07/26/2012  . Hyperlipidemia with target LDL less than 70 12/03/2011  . DM (diabetes mellitus) type II uncontrolled, periph vascular disorder ([place]) 04/03/2011  . CAD (coronary artery disease) 12/13/2009    Past Surgical History:  Procedure Laterality Date  . ABDOMINAL HYSTERECTOMY     uterine cancer  . BREAST SURGERY    . COLON SURGERY  2013   done at [facility]  . OPEN REDUCTION INTERNAL FIXATION (ORIF) DISTAL RADIAL FRACTURE Right 05/05/2017   Procedure: OPEN REDUCTION INTERNAL FIXATION (ORIF) DISTAL RADIAL FRACTURE;  Surgeon: Hessie Knows, MD;  Location: [facility] ORS;  Service: Orthopedics;  Laterality: Right;    Prior to Admission medications   Medication Sig Start Date End Date Taking? Authorizing Provider  aspirin EC 81 MG tablet Take 1 tablet (81 mg total) by mouth daily. 04/09/16   [name], DO  atorvastatin (LIPITOR) 80 MG tablet Take 1 tablet (80 mg total) by mouth daily. 08/21/16   [name], DO  clopidogrel (PLAVIX) 75 MG tablet TAKE 1 TABLET(75 MG) BY MOUTH DAILY 01/01/17   Cook, Jayce G, DO  fluticasone (FLONASE) 50 MCG/ACT nasal spray Place 2 sprays into both nostrils daily. 01/01/17   [name], DO  insulin aspart (NOVOLOG) 100 UNIT/ML injection Inject  3 Units into the skin 3 (three) times daily with meals. Patient taking differently: Inject 0-10 Units into the skin 3 (three) times daily with meals. 150-200= 3 UNITS 201-250= 5 UNITS 251-300= 7 UNITS 301-350= 8 UNITS 351-400= 10 UNITS 04/07/17   Demetrios Loll, MD  insulin aspart (NOVOLOG) 100 UNIT/ML injection Inject 0-5 Units into the skin at bedtime. Patient not taking: Reported on 04/26/2017 04/07/17   Demetrios Loll, MD  insulin aspart (NOVOLOG) 100 UNIT/ML injection Inject 0-9 Units into the skin 3 (three) times daily with  meals. Patient not taking: Reported on 04/29/2017 04/07/17   Demetrios Loll, MD  insulin glargine (LANTUS) 100 UNIT/ML injection Inject 0.15 mLs (15 Units total) into the skin daily. Patient taking differently: Inject 23 Units into the skin at bedtime.  04/08/17   Demetrios Loll, MD  levothyroxine (SYNTHROID, LEVOTHROID) 100 MCG tablet TAKE 1 TABLET(100 MCG) BY MOUTH DAILY 01/01/17   [name], DO  loperamide (IMODIUM) 2 MG capsule Take 4 mg by mouth as needed for diarrhea or loose stools.    [provider]  metoCLOPramide (REGLAN) 5 MG tablet Take 5 mg by mouth 4 (four) times daily -  before meals and at bedtime.     [provider]  midodrine (PROAMATINE) 10 MG tablet Take 1 tablet (10 mg total) by mouth 3 (three) times daily with meals. 01/30/17   [name], DO  oxyCODONE (ROXICODONE) 5 MG immediate release tablet Take 0.5 tablets (2.5 mg total) by mouth every 4 (four) hours as needed. 04/29/17 04/29/18  Rudene Re, MD  pantoprazole (PROTONIX) 40 MG tablet Take 1 tablet (40 mg total) by mouth 2 (two) times daily. 01/04/16   Jackolyn Confer, MD  sennosides-docusate sodium (SENOKOT-S) 8.6-50 MG tablet Take 2 tablets by mouth daily.    [provider]  sertraline (ZOLOFT) 25 MG tablet Take 1 tablet (25 mg total) by mouth daily after breakfast. 02/17/17   Fritzi Mandes, MD    Allergies Penicillins  Family History  Problem Relation Age of Onset  . Diabetes Mother   . Heart attack Father     Social History Social History  Substance Use Topics  . Smoking status: Never Smoker  . Smokeless tobacco: Never Used  . Alcohol use No    Review of Systems Constitutional: No fever/chills Eyes: No visual changes. ENT: No sore throat. Cardiovascular: Denies chest pain. Respiratory: Denies shortness of breath. Gastrointestinal: No abdominal pain.  No nausea, no vomiting.  No diarrhea.  No constipation. Genitourinary: Negative for dysuria. Musculoskeletal: Negative for  neck pain.  Negative for back pain. Integumentary: Negative for rash.positive for right facial swelling and contusion Neurological: Negative for headaches, focal weakness or numbness.   ____________________________________________   PHYSICAL EXAM:  VITAL SIGNS: ED Triage Vitals  Enc Vitals Group     BP 05/19/17 2344 133/72     Pulse Rate 05/19/17 2344 88     Resp 05/19/17 2344 16     Temp 05/19/17 2344 98.2 F (36.8 C)     Temp Source 05/19/17 2344 Oral     SpO2 05/19/17 2344 98 %     Weight 05/19/17 2339 54.4 kg (120 lb)     Height --      Head Circumference --      Peak Flow --      Pain Score --      Pain Loc --      Pain Edu? --      Excl. in [place]? --  Constitutional: Alert but disoriented. Eyes: Conjunctivae are normal. PERRL. EOMI.positive periorbital ecchymoses Head: right facial contusion swelling noted over right ZMC and periorbital region. Nose: No congestion/rhinnorhea. Mouth/Throat: Mucous membranes are moist.  Oropharynx non-erythematous. Neck: No stridor.  No cervical spine tenderness to palpation. Cardiovascular: Normal rate, regular rhythm. Good peripheral circulation. Grossly normal heart sounds. Respiratory: Normal respiratory effort.  No retractions. Lungs CTAB. Gastrointestinal: Soft and nontender. No distention.  Musculoskeletal: No lower extremity tenderness nor edema. No gross deformities of extremities. Neurologic:  Normal speech and language. No gross focal neurologic deficits are appreciated.  Skin:  right facial swelling over the ZMC and periorbital region Psychiatric: Mood and affect are normal. Speech and behavior are normal.  ____________________________________________   LABS (all labs ordered are listed, but only abnormal results are displayed)  Labs Reviewed  C DIFFICILE QUICK SCREEN W PCR REFLEX - Abnormal; Notable for the following:       Result Value   C Diff antigen POSITIVE (*)    All other components within normal limits    BASIC METABOLIC PANEL - Abnormal; Notable for the following:    Sodium 131 (*)    Chloride 99 (*)    CO2 21 (*)    Glucose, Bld 529 (*)    Creatinine, Ser 1.14 (*)    GFR calc non Af Amer 43 (*)    GFR calc Af Amer 50 (*)    All other components within normal limits  CBC - Abnormal; Notable for the following:    RBC 2.93 (*)    Hemoglobin 9.4 (*)    HCT 28.5 (*)    RDW 17.8 (*)    All other components within normal limits  GASTROINTESTINAL PANEL BY PCR, STOOL (REPLACES STOOL CULTURE)  CLOSTRIDIUM DIFFICILE BY PCR  TROPONIN I  URINALYSIS, COMPLETE (UACMP) WITH MICROSCOPIC    RADIOLOGY I, [place] N BROWN, personally viewed and evaluated these images (plain radiographs) as part of my medical decision making, as well as reviewing the written report by the radiologist.  Ct Head Wo Contrast  Result Date: 05/20/2017 CLINICAL DATA:  [place] from bed, hit RIGHT side of face on floor. History of dementia. EXAM: CT HEAD WITHOUT CONTRAST CT MAXILLOFACIAL WITHOUT CONTRAST CT CERVICAL SPINE WITHOUT CONTRAST TECHNIQUE: Multidetector CT imaging of the head, cervical spine, and maxillofacial structures were performed using the standard protocol without intravenous contrast. Multiplanar CT image reconstructions of the cervical spine and maxillofacial structures were also generated. COMPARISON:  CT HEAD and cervical spine April 29, 2017 and MRI of the head April 24, 2017 FINDINGS: CT HEAD FINDINGS BRAIN: No intraparenchymal hemorrhage, mass effect nor midline shift. Moderate to severe ventriculomegaly with disproportionate sulcal effacement at the convexities, narrowed callosal angle. Confluent supratentorial and pontine white matter hypodensities. Old basal ganglia, thalamus lacunar infarcts. Old small LEFT cerebellar infarcts. No acute large vascular territory infarct. No abnormal extra-axial fluid collections. VASCULAR: Mild calcific atherosclerosis of the carotid siphons. SKULL: No skull fracture.  No significant scalp soft tissue swelling. OTHER: None. CT MAXILLOFACIAL FINDINGS OSSEOUS: The mandible is intact, the condyles are located. No acute facial fracture. No destructive bony lesions. ORBITS: Ocular globes and orbital contents are nonacute. Status post bilateral ocular lens implants. SINUSES: Minimal debris and sphenoid sinuses. Trace paranasal sinus mucosal thickening. Status post FESS. Nasal septum is midline. Included mastoid aircells are well aerated. SOFT TISSUES: RIGHT periorbital premalar soft tissue swelling in large RIGHT facial hematoma. No subcutaneous gas or radiopaque foreign bodies. CT CERVICAL SPINE FINDINGS ALIGNMENT: Straightened  lordosis. Vertebral bodies in alignment. SKULL BASE AND VERTEBRAE: Cervical vertebral bodies and posterior elements are intact. C3-4 bridging bone mineral density. Severe C5-6 and C6-7 disc height loss with endplate spurring compatible with degenerative discs. C1-2 articulation maintained with advanced atlantodental osteoarthrosis. Calcified apical ligaments. Multilevel moderate to severe LEFT facet arthropathy. No destructive bony lesions. SOFT TISSUES AND SPINAL CANAL: Nonacute. Mild calcific atherosclerosis carotid bifurcations. DISC LEVELS: No significant osseous canal stenosis. Moderate LEFT C2-3, moderate RIGHT C3-4, moderate bilateral C4-5, moderate to severe bilateral C5-6 and C6-7 neural foraminal narrowing. UPPER CHEST: Lung apices are clear. OTHER: None. IMPRESSION: CT HEAD: 1. No acute intracranial process. 2. 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 3. Severe chronic small vessel ischemic disease and scattered small infarct. CT MAXILLOFACIAL: 1. RIGHT periorbital soft tissue swelling/contusion with hematoma. No postseptal extension. 2. No acute facial fracture. CT CERVICAL SPINE: 1. No acute fracture or malalignment. 2. Stable degenerative change of the cervical spine, moderate to severe C5-6 C6-7  neural foraminal narrowing. Electronically Signed   By: Elon Alas M.D.   On: 05/20/2017 00:41   Ct Cervical Spine Wo Contrast  Result Date: 05/20/2017 CLINICAL DATA:  [place] from bed, hit RIGHT side of face on floor. History of dementia. EXAM: CT HEAD WITHOUT CONTRAST CT MAXILLOFACIAL WITHOUT CONTRAST CT CERVICAL SPINE WITHOUT CONTRAST TECHNIQUE: Multidetector CT imaging of the head, cervical spine, and maxillofacial structures were performed using the standard protocol without intravenous contrast. Multiplanar CT image reconstructions of the cervical spine and maxillofacial structures were also generated. COMPARISON:  CT HEAD and cervical spine April 29, 2017 and MRI of the head April 24, 2017 FINDINGS: CT HEAD FINDINGS BRAIN: No intraparenchymal hemorrhage, mass effect nor midline shift. Moderate to severe ventriculomegaly with disproportionate sulcal effacement at the convexities, narrowed callosal angle. Confluent supratentorial and pontine white matter hypodensities. Old basal ganglia, thalamus lacunar infarcts. Old small LEFT cerebellar infarcts. No acute large vascular territory infarct. No abnormal extra-axial fluid collections. VASCULAR: Mild calcific atherosclerosis of the carotid siphons. SKULL: No skull fracture. No significant scalp soft tissue swelling. OTHER: None. CT MAXILLOFACIAL FINDINGS OSSEOUS: The mandible is intact, the condyles are located. No acute facial fracture. No destructive bony lesions. ORBITS: Ocular globes and orbital contents are nonacute. Status post bilateral ocular lens implants. SINUSES: Minimal debris and sphenoid sinuses. Trace paranasal sinus mucosal thickening. Status post FESS. Nasal septum is midline. Included mastoid aircells are well aerated. SOFT TISSUES: RIGHT periorbital premalar soft tissue swelling in large RIGHT facial hematoma. No subcutaneous gas or radiopaque foreign bodies. CT CERVICAL SPINE FINDINGS ALIGNMENT: Straightened lordosis. Vertebral  bodies in alignment. SKULL BASE AND VERTEBRAE: Cervical vertebral bodies and posterior elements are intact. C3-4 bridging bone mineral density. Severe C5-6 and C6-7 disc height loss with endplate spurring compatible with degenerative discs. C1-2 articulation maintained with advanced atlantodental osteoarthrosis. Calcified apical ligaments. Multilevel moderate to severe LEFT facet arthropathy. No destructive bony lesions. SOFT TISSUES AND SPINAL CANAL: Nonacute. Mild calcific atherosclerosis carotid bifurcations. DISC LEVELS: No significant osseous canal stenosis. Moderate LEFT C2-3, moderate RIGHT C3-4, moderate bilateral C4-5, moderate to severe bilateral C5-6 and C6-7 neural foraminal narrowing. UPPER CHEST: Lung apices are clear. OTHER: None. IMPRESSION: CT HEAD: 1. No acute intracranial process. 2. 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 3. Severe chronic small vessel ischemic disease and scattered small infarct. CT MAXILLOFACIAL: 1. RIGHT periorbital soft tissue swelling/contusion with hematoma. No postseptal extension. 2. No acute facial fracture. CT CERVICAL SPINE:  1. No acute fracture or malalignment. 2. Stable degenerative change of the cervical spine, moderate to severe C5-6 C6-7 neural foraminal narrowing. Electronically Signed   By: Elon Alas M.D.   On: 05/20/2017 00:41   Dg Chest Port 1 View  Result Date: 05/20/2017 CLINICAL DATA:  83 year old female with fall. EXAM: PORTABLE CHEST 1 VIEW COMPARISON:  Chest radiograph dated 04/15/2017 FINDINGS: There is a linear lucency in the periphery of the right lung which is favored to represent a skin fold. A small pneumothorax is less likely but not entirely excluded. Recommend repeat radiograph with repositioning of the patient. The lungs are clear. There is no pleural effusion. The cardiac silhouette is within normal limits. There is atherosclerotic calcification of the thoracic aorta. The bones  are osteopenic. No acute osseous pathology. IMPRESSION: Artifact versus less likely a small right pneumothorax. Repeat radiograph with better positioning of the patient recommended. No other acute cardiopulmonary process. Electronically Signed   By: Anner Crete M.D.   On: 05/20/2017 00:12   Ct Maxillofacial Wo Contrast  Result Date: 05/20/2017 CLINICAL DATA:  [place] from bed, hit RIGHT side of face on floor. History of dementia. EXAM: CT HEAD WITHOUT CONTRAST CT MAXILLOFACIAL WITHOUT CONTRAST CT CERVICAL SPINE WITHOUT CONTRAST TECHNIQUE: Multidetector CT imaging of the head, cervical spine, and maxillofacial structures were performed using the standard protocol without intravenous contrast. Multiplanar CT image reconstructions of the cervical spine and maxillofacial structures were also generated. COMPARISON:  CT HEAD and cervical spine April 29, 2017 and MRI of the head April 24, 2017 FINDINGS: CT HEAD FINDINGS BRAIN: No intraparenchymal hemorrhage, mass effect nor midline shift. Moderate to severe ventriculomegaly with disproportionate sulcal effacement at the convexities, narrowed callosal angle. Confluent supratentorial and pontine white matter hypodensities. Old basal ganglia, thalamus lacunar infarcts. Old small LEFT cerebellar infarcts. No acute large vascular territory infarct. No abnormal extra-axial fluid collections. VASCULAR: Mild calcific atherosclerosis of the carotid siphons. SKULL: No skull fracture. No significant scalp soft tissue swelling. OTHER: None. CT MAXILLOFACIAL FINDINGS OSSEOUS: The mandible is intact, the condyles are located. No acute facial fracture. No destructive bony lesions. ORBITS: Ocular globes and orbital contents are nonacute. Status post bilateral ocular lens implants. SINUSES: Minimal debris and sphenoid sinuses. Trace paranasal sinus mucosal thickening. Status post FESS. Nasal septum is midline. Included mastoid aircells are well aerated. SOFT TISSUES: RIGHT  periorbital premalar soft tissue swelling in large RIGHT facial hematoma. No subcutaneous gas or radiopaque foreign bodies. CT CERVICAL SPINE FINDINGS ALIGNMENT: Straightened lordosis. Vertebral bodies in alignment. SKULL BASE AND VERTEBRAE: Cervical vertebral bodies and posterior elements are intact. C3-4 bridging bone mineral density. Severe C5-6 and C6-7 disc height loss with endplate spurring compatible with degenerative discs. C1-2 articulation maintained with advanced atlantodental osteoarthrosis. Calcified apical ligaments. Multilevel moderate to severe LEFT facet arthropathy. No destructive bony lesions. SOFT TISSUES AND SPINAL CANAL: Nonacute. Mild calcific atherosclerosis carotid bifurcations. DISC LEVELS: No significant osseous canal stenosis. Moderate LEFT C2-3, moderate RIGHT C3-4, moderate bilateral C4-5, moderate to severe bilateral C5-6 and C6-7 neural foraminal narrowing. UPPER CHEST: Lung apices are clear. OTHER: None. IMPRESSION: CT HEAD: 1. No acute intracranial process. 2. 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 3. Severe chronic small vessel ischemic disease and scattered small infarct. CT MAXILLOFACIAL: 1. RIGHT periorbital soft tissue swelling/contusion with hematoma. No postseptal extension. 2. No acute facial fracture. CT CERVICAL SPINE: 1. No acute fracture or malalignment. 2. Stable degenerative change of the cervical spine, moderate to severe C5-6  C6-7 neural foraminal narrowing. Electronically Signed   By: Elon Alas M.D.   On: 05/20/2017 00:41     Procedures   ____________________________________________   INITIAL IMPRESSION / ASSESSMENT AND PLAN / ED COURSE  As part of my medical decision making, I reviewed the following data within the electronic MEDICAL RECORD NUMBER 83 year old female presenting with above stated history of physical exam consistent with right facial contusion concern for possiblefacial fractures and  intracranial injury and a such CT scan of thehead and face performed which revealed only soft tissue hematoma without any underlying fracture. Patient noted to have loose stools and a such sample sent for evaluation which reveals C. difficile antigen positive. Given the fact that the patient symptomatically will treat with Flagyl. Patient discussed with Dr. Marcille Blanco for hospital admission further evaluation and management. Patient's son made aware of all clinical findings    ____________________________________________  FINAL CLINICAL IMPRESSION(S) / ED DIAGNOSES  Final diagnoses:  C. difficile colitis  Facial contusion, initial encounter     MEDICATIONS GIVEN DURING THIS VISIT:  Medications  insulin aspart (novoLOG) injection 8 Units (8 Units Intravenous Given 05/20/17 0120)  morphine 2 MG/ML injection 2 mg (2 mg Intravenous Given 05/20/17 0228)  ondansetron (ZOFRAN) injection 4 mg (4 mg Intravenous Given 05/20/17 0228)  sodium chloride 0.9 % bolus 1,000 mL (0 mLs Intravenous Stopped 05/20/17 0335)  metroNIDAZOLE (FLAGYL) tablet 500 mg (500 mg Oral Given 05/20/17 0350)     NEW OUTPATIENT MEDICATIONS STARTED DURING THIS VISIT:  New Prescriptions   No medications on file    Modified Medications   No medications on file    Discontinued Medications   No medications on file     Note:  This document was prepared using Dragon voice recognition software and may include unintentional dictation errors.    Gregor Hams, MD 05/20/17 [phone]

## 2017-05-20 NOTE — Care Management (Signed)
Per Corene Cornea with [facility]. Daughter at [facility] requesting a wheelchair and hospital bed. TC to daughter, Lester [place]. Explained to her that I could order those items for her mother. She was appreciative. TC to  Dr. Posey Pronto, requested orders.

## 2017-05-20 NOTE — Progress Notes (Signed)
[place] at [facility]                                                                                                                                                                                  Patient Demographics   [facility], is a 83 y.o. female, DOB - [birth year], UXL:[id number]  Admit date - 05/19/2017   Admitting Physician Harrie Foreman, MD  Outpatient Primary MD for the patient is [name], DO   LOS - 0  Subjective: Patient admitted with fall however also had diarrhea and had's positive C. Difficile antigen positive    Review of Systems:   CONSTITUTIONAL: No documented fever. No fatigue, positive weakness. No weight gain, no weight loss.  EYES: No blurry or double vision.  ENT: No tinnitus. No postnasal drip. No redness of the oropharynx.  RESPIRATORY: No cough, no wheeze, no hemoptysis. No dyspnea.  CARDIOVASCULAR: No chest pain. No orthopnea. No palpitations. No syncope.  GASTROINTESTINAL: No nausea, no vomiting or positive diarrhea. No abdominal pain. No melena or hematochezia.  GENITOURINARY: No dysuria or hematuria.  ENDOCRINE: No polyuria or nocturia. No heat or cold intolerance.  HEMATOLOGY: No anemia. No bruising. No bleeding.  INTEGUMENTARY: No rashes. No lesions.  MUSCULOSKELETAL: No arthritis. No swelling. No gout.  NEUROLOGIC: No numbness, tingling, or ataxia. No seizure-type activity.  PSYCHIATRIC: No anxiety. No insomnia. No ADD.    Vitals:   Vitals:   05/20/17 0445 05/20/17 0543 05/20/17 0834 05/20/17 1223  BP: (!) 101/52 (!) 108/45 (!) 106/52 (!) 106/45  Pulse: 80 76 86 82  Resp: 13 16 18 16   Temp:  98.1 F (36.7 C) 98 F (36.7 C) 97.9 F (36.6 C)  TempSrc:  Oral Oral Oral  SpO2: 96% 98% 99%   Weight:  122 lb 9.6 oz (55.6 kg)    Height:  5\' 4"  (1.626 m)      Wt Readings from Last 3 Encounters:  05/20/17 122 lb 9.6 oz (55.6 kg)  05/05/17 120 lb (54.4 kg)  04/29/17 120 lb (54.4 kg)    No intake  or output data in the 24 hours ending 05/20/17 1434  Physical Exam:   GENERAL: Pleasant-appearing in no apparent distress.  HEAD, EYES, EARS, NOSE AND THROAT: Atraumatic, normocephalic. Extraocular muscles are intact. Pupils equal and reactive to light. Sclerae anicteric. No conjunctival injection. No oro-pharyngeal erythema. Positive bruising around the eyes NECK: Supple. There is no jugular venous distention. No bruits, no lymphadenopathy, no thyromegaly.  HEART: Regular rate and rhythm,. No murmurs, no rubs, no clicks.  LUNGS: Clear to auscultation bilaterally. No rales or rhonchi. No wheezes.  ABDOMEN: Soft, flat, nontender, nondistended.  Has good bowel sounds. No hepatosplenomegaly appreciated.  EXTREMITIES: No evidence of any cyanosis, clubbing, or peripheral edema.  +2 pedal and radial pulses bilaterally.  NEUROLOGIC: The patient is alert, awake, and oriented x3 with no focal motor or sensory deficits appreciated bilaterally.  SKIN: bruising around the eyes as well as skin hematoma Psych: Not anxious, depressed LN: No inguinal LN enlargement    Antibiotics   Anti-infectives    Start     Dose/Rate Route Frequency Ordered Stop   05/20/17 0600  metroNIDAZOLE (FLAGYL) tablet 500 mg     500 mg Oral Every 8 hours 05/20/17 0527     05/20/17 0345  metroNIDAZOLE (FLAGYL) tablet 500 mg     500 mg Oral  Once 05/20/17 0335 05/20/17 0350      Medications   Scheduled Meds: . aspirin EC  81 mg Oral Daily  . atorvastatin  80 mg Oral Daily  . clopidogrel  75 mg Oral Daily  . docusate sodium  100 mg Oral BID  . [START ON 05/21/2017] famotidine  20 mg Oral Q48H  . fluticasone  2 spray Each Nare Daily  . heparin subcutaneous  5,000 Units Subcutaneous Q8H  . insulin aspart  0-9 Units Subcutaneous TID WC  . insulin glargine  10 Units Subcutaneous QHS  . levothyroxine  100 mcg Oral QAC breakfast  . metoCLOPramide  5 mg Oral TID AC & HS  . metroNIDAZOLE  500 mg Oral Q8H  . midodrine  10 mg  Oral TID WC  . senna-docusate  2 tablet Oral Daily  . sertraline  25 mg Oral QPC breakfast   Continuous Infusions: . sodium chloride 100 mL/hr at 05/20/17 0620   PRN Meds:.acetaminophen **OR** acetaminophen, loperamide, ondansetron **OR** ondansetron (ZOFRAN) IV, oxyCODONE   Data Review:   Micro Results Recent Results (from the past 240 hour(s))  Gastrointestinal Panel by PCR , Stool     Status: None   Collection Time: 05/19/17 11:47 PM  Result Value Ref Range Status   Campylobacter species NOT DETECTED NOT DETECTED Final   Plesimonas shigelloides NOT DETECTED NOT DETECTED Final   Salmonella species NOT DETECTED NOT DETECTED Final   Yersinia enterocolitica NOT DETECTED NOT DETECTED Final   Vibrio species NOT DETECTED NOT DETECTED Final   Vibrio cholerae NOT DETECTED NOT DETECTED Final   Enteroaggregative E coli (EAEC) NOT DETECTED NOT DETECTED Final   Enteropathogenic E coli (EPEC) NOT DETECTED NOT DETECTED Final   Enterotoxigenic E coli (ETEC) NOT DETECTED NOT DETECTED Final   Shiga like toxin producing E coli (STEC) NOT DETECTED NOT DETECTED Final   Shigella/Enteroinvasive E coli (EIEC) NOT DETECTED NOT DETECTED Final   Cryptosporidium NOT DETECTED NOT DETECTED Final   Cyclospora cayetanensis NOT DETECTED NOT DETECTED Final   Entamoeba histolytica NOT DETECTED NOT DETECTED Final   Giardia lamblia NOT DETECTED NOT DETECTED Final   Adenovirus F40/41 NOT DETECTED NOT DETECTED Final   Astrovirus NOT DETECTED NOT DETECTED Final   Norovirus GI/GII NOT DETECTED NOT DETECTED Final   Rotavirus A NOT DETECTED NOT DETECTED Final   Sapovirus (I, II, IV, and V) NOT DETECTED NOT DETECTED Final  C difficile quick scan w PCR reflex     Status: Abnormal   Collection Time: 05/19/17 11:47 PM  Result Value Ref Range Status   C Diff antigen POSITIVE (A) NEGATIVE Final   C Diff toxin NEGATIVE NEGATIVE Final   C Diff interpretation Results are indeterminate. See PCR results.  Final  Clostridium  Difficile by  PCR     Status: None   Collection Time: 05/19/17 11:47 PM  Result Value Ref Range Status   Toxigenic C Difficile by pcr NEGATIVE NEGATIVE Final    Comment: Patient is colonized with non toxigenic C. difficile. May not need treatment unless significant symptoms are present.  MRSA PCR Screening     Status: None   Collection Time: 05/20/17  5:36 AM  Result Value Ref Range Status   MRSA by PCR NEGATIVE NEGATIVE Final    Comment:        The GeneXpert MRSA Assay (FDA approved for NASAL specimens only), is one component of a comprehensive MRSA colonization surveillance program. It is not intended to diagnose MRSA infection nor to guide or monitor treatment for MRSA infections.     Radiology Reports Dg Forearm Right  Result Date: 04/29/2017 CLINICAL DATA:  Fall EXAM: RIGHT FOREARM - 2 VIEW COMPARISON:  04/06/2011 FINDINGS: Osteopenia. Acute, comminuted impacted intra-articular distal radius fracture. About 1/3 bone with of dorsal displacement of distal fracture fragment. Mild dorsal and radial angulation of distal fracture fragments. Additional comminuted fracture involving the ulnar styloid and distal shaft and metaphysis of the ulna. This also demonstrates about 1/2 shaft diameter of dorsal displacement and radial displacement of distal fracture fragment. Mild dorsal angulation of distal fracture fragment. IMPRESSION: 1. Acute, comminuted displaced and angulated intra-articular distal radius fracture 2. Acute comminuted displaced and angulated distal ulna fracture Electronically Signed   By: Donavan Foil M.D.   On: 04/29/2017 19:12   Dg Wrist 2 Views Right  Result Date: 05/05/2017 CLINICAL DATA:  ORIF. EXAM: RIGHT WRIST - 2 VIEW COMPARISON:  No recent prior. FINDINGS: Casting right wrist. Plate screw fixation distal radius noted. Hardware intact. Anatomic alignment. IMPRESSION: Plate and screw fixation distal radius.  Anatomic alignment. Electronically Signed   By: [place]   On: 05/05/2017 15:35   Ct Head Wo Contrast  Result Date: 05/20/2017 CLINICAL DATA:  [place] from bed, hit RIGHT side of face on floor. History of dementia. EXAM: CT HEAD WITHOUT CONTRAST CT MAXILLOFACIAL WITHOUT CONTRAST CT CERVICAL SPINE WITHOUT CONTRAST TECHNIQUE: Multidetector CT imaging of the head, cervical spine, and maxillofacial structures were performed using the standard protocol without intravenous contrast. Multiplanar CT image reconstructions of the cervical spine and maxillofacial structures were also generated. COMPARISON:  CT HEAD and cervical spine April 29, 2017 and MRI of the head April 24, 2017 FINDINGS: CT HEAD FINDINGS BRAIN: No intraparenchymal hemorrhage, mass effect nor midline shift. Moderate to severe ventriculomegaly with disproportionate sulcal effacement at the convexities, narrowed callosal angle. Confluent supratentorial and pontine white matter hypodensities. Old basal ganglia, thalamus lacunar infarcts. Old small LEFT cerebellar infarcts. No acute large vascular territory infarct. No abnormal extra-axial fluid collections. VASCULAR: Mild calcific atherosclerosis of the carotid siphons. SKULL: No skull fracture. No significant scalp soft tissue swelling. OTHER: None. CT MAXILLOFACIAL FINDINGS OSSEOUS: The mandible is intact, the condyles are located. No acute facial fracture. No destructive bony lesions. ORBITS: Ocular globes and orbital contents are nonacute. Status post bilateral ocular lens implants. SINUSES: Minimal debris and sphenoid sinuses. Trace paranasal sinus mucosal thickening. Status post FESS. Nasal septum is midline. Included mastoid aircells are well aerated. SOFT TISSUES: RIGHT periorbital premalar soft tissue swelling in large RIGHT facial hematoma. No subcutaneous gas or radiopaque foreign bodies. CT CERVICAL SPINE FINDINGS ALIGNMENT: Straightened lordosis. Vertebral bodies in alignment. SKULL BASE AND VERTEBRAE: Cervical vertebral bodies and  posterior elements are intact. C3-4 bridging bone mineral density. Severe  C5-6 and C6-7 disc height loss with endplate spurring compatible with degenerative discs. C1-2 articulation maintained with advanced atlantodental osteoarthrosis. Calcified apical ligaments. Multilevel moderate to severe LEFT facet arthropathy. No destructive bony lesions. SOFT TISSUES AND SPINAL CANAL: Nonacute. Mild calcific atherosclerosis carotid bifurcations. DISC LEVELS: No significant osseous canal stenosis. Moderate LEFT C2-3, moderate RIGHT C3-4, moderate bilateral C4-5, moderate to severe bilateral C5-6 and C6-7 neural foraminal narrowing. UPPER CHEST: Lung apices are clear. OTHER: None. IMPRESSION: CT HEAD: 1. No acute intracranial process. 2. 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 3. Severe chronic small vessel ischemic disease and scattered small infarct. CT MAXILLOFACIAL: 1. RIGHT periorbital soft tissue swelling/contusion with hematoma. No postseptal extension. 2. No acute facial fracture. CT CERVICAL SPINE: 1. No acute fracture or malalignment. 2. Stable degenerative change of the cervical spine, moderate to severe C5-6 C6-7 neural foraminal narrowing. Electronically Signed   By: Elon Alas M.D.   On: 05/20/2017 00:41   Ct Head Wo Contrast  Result Date: 04/29/2017 CLINICAL DATA:  Unwitnessed fall with laceration right orbital region. EXAM: CT HEAD WITHOUT CONTRAST CT CERVICAL SPINE WITHOUT CONTRAST TECHNIQUE: Multidetector CT imaging of the head and cervical spine was performed following the standard protocol without intravenous contrast. Multiplanar CT image reconstructions of the cervical spine were also generated. COMPARISON:  04/15/2017 FINDINGS: CT HEAD FINDINGS Brain: Ventricles and cisterns are within normal. There is minimal age related atrophic change. There is mild to moderate chronic ischemic microvascular disease. There is no mass, mass effect, shift  of midline structures or acute hemorrhage. Old lacune infarct over the left caudate. No acute infarction. Vascular: No hyperdense vessel or unexpected calcification. Skull: Normal. Negative for fracture or focal lesion. Sinuses/Orbits: Orbits are within normal. Paranasal sinuses are well developed and well aerated with evidence of previous sinus surgery with fenestration of the medial wall of the maxillary sinuses. Mastoid air cells are clear. Other: None. CT CERVICAL SPINE FINDINGS Alignment: Subtle stable degenerative subluxation of C4 on C5. Skull base and vertebrae: Mild spondylosis throughout the cervical spine. Vertebral body heights are maintained. There is moderate uncovertebral joint spurring and facet arthropathy. No acute fracture. Significant neural foraminal narrowing bilaterally at multiple levels due to adjacent bony spurring. Soft tissues and spinal canal: No prevertebral fluid or swelling. No visible canal hematoma. Disc levels: Disc space narrowing at the C3-4, C5-6 and C6-7 levels. Upper chest: Negative. Other: None. IMPRESSION: No acute intracranial findings. Chronic ischemic microvascular disease and mild age related atrophic change. Small old lacunar infarct left caudate. No acute cervical spine injury. Mild spondylosis throughout the cervical spine with multilevel disc disease and multilevel neural foraminal narrowing. Electronically Signed   By: Marin Olp M.D.   On: 04/29/2017 19:09   Ct Cervical Spine Wo Contrast  Result Date: 05/20/2017 CLINICAL DATA:  [place] from bed, hit RIGHT side of face on floor. History of dementia. EXAM: CT HEAD WITHOUT CONTRAST CT MAXILLOFACIAL WITHOUT CONTRAST CT CERVICAL SPINE WITHOUT CONTRAST TECHNIQUE: Multidetector CT imaging of the head, cervical spine, and maxillofacial structures were performed using the standard protocol without intravenous contrast. Multiplanar CT image reconstructions of the cervical spine and maxillofacial structures were also  generated. COMPARISON:  CT HEAD and cervical spine April 29, 2017 and MRI of the head April 24, 2017 FINDINGS: CT HEAD FINDINGS BRAIN: No intraparenchymal hemorrhage, mass effect nor midline shift. Moderate to severe ventriculomegaly with disproportionate sulcal effacement at the convexities, narrowed callosal angle. Confluent supratentorial and pontine white matter hypodensities.  Old basal ganglia, thalamus lacunar infarcts. Old small LEFT cerebellar infarcts. No acute large vascular territory infarct. No abnormal extra-axial fluid collections. VASCULAR: Mild calcific atherosclerosis of the carotid siphons. SKULL: No skull fracture. No significant scalp soft tissue swelling. OTHER: None. CT MAXILLOFACIAL FINDINGS OSSEOUS: The mandible is intact, the condyles are located. No acute facial fracture. No destructive bony lesions. ORBITS: Ocular globes and orbital contents are nonacute. Status post bilateral ocular lens implants. SINUSES: Minimal debris and sphenoid sinuses. Trace paranasal sinus mucosal thickening. Status post FESS. Nasal septum is midline. Included mastoid aircells are well aerated. SOFT TISSUES: RIGHT periorbital premalar soft tissue swelling in large RIGHT facial hematoma. No subcutaneous gas or radiopaque foreign bodies. CT CERVICAL SPINE FINDINGS ALIGNMENT: Straightened lordosis. Vertebral bodies in alignment. SKULL BASE AND VERTEBRAE: Cervical vertebral bodies and posterior elements are intact. C3-4 bridging bone mineral density. Severe C5-6 and C6-7 disc height loss with endplate spurring compatible with degenerative discs. C1-2 articulation maintained with advanced atlantodental osteoarthrosis. Calcified apical ligaments. Multilevel moderate to severe LEFT facet arthropathy. No destructive bony lesions. SOFT TISSUES AND SPINAL CANAL: Nonacute. Mild calcific atherosclerosis carotid bifurcations. DISC LEVELS: No significant osseous canal stenosis. Moderate LEFT C2-3, moderate RIGHT C3-4,  moderate bilateral C4-5, moderate to severe bilateral C5-6 and C6-7 neural foraminal narrowing. UPPER CHEST: Lung apices are clear. OTHER: None. IMPRESSION: CT HEAD: 1. No acute intracranial process. 2. 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 3. Severe chronic small vessel ischemic disease and scattered small infarct. CT MAXILLOFACIAL: 1. RIGHT periorbital soft tissue swelling/contusion with hematoma. No postseptal extension. 2. No acute facial fracture. CT CERVICAL SPINE: 1. No acute fracture or malalignment. 2. Stable degenerative change of the cervical spine, moderate to severe C5-6 C6-7 neural foraminal narrowing. Electronically Signed   By: Elon Alas M.D.   On: 05/20/2017 00:41   Ct Cervical Spine Wo Contrast  Result Date: 04/29/2017 CLINICAL DATA:  Unwitnessed fall with laceration right orbital region. EXAM: CT HEAD WITHOUT CONTRAST CT CERVICAL SPINE WITHOUT CONTRAST TECHNIQUE: Multidetector CT imaging of the head and cervical spine was performed following the standard protocol without intravenous contrast. Multiplanar CT image reconstructions of the cervical spine were also generated. COMPARISON:  04/15/2017 FINDINGS: CT HEAD FINDINGS Brain: Ventricles and cisterns are within normal. There is minimal age related atrophic change. There is mild to moderate chronic ischemic microvascular disease. There is no mass, mass effect, shift of midline structures or acute hemorrhage. Old lacune infarct over the left caudate. No acute infarction. Vascular: No hyperdense vessel or unexpected calcification. Skull: Normal. Negative for fracture or focal lesion. Sinuses/Orbits: Orbits are within normal. Paranasal sinuses are well developed and well aerated with evidence of previous sinus surgery with fenestration of the medial wall of the maxillary sinuses. Mastoid air cells are clear. Other: None. CT CERVICAL SPINE FINDINGS Alignment: Subtle stable degenerative  subluxation of C4 on C5. Skull base and vertebrae: Mild spondylosis throughout the cervical spine. Vertebral body heights are maintained. There is moderate uncovertebral joint spurring and facet arthropathy. No acute fracture. Significant neural foraminal narrowing bilaterally at multiple levels due to adjacent bony spurring. Soft tissues and spinal canal: No prevertebral fluid or swelling. No visible canal hematoma. Disc levels: Disc space narrowing at the C3-4, C5-6 and C6-7 levels. Upper chest: Negative. Other: None. IMPRESSION: No acute intracranial findings. Chronic ischemic microvascular disease and mild age related atrophic change. Small old lacunar infarct left caudate. No acute cervical spine injury. Mild spondylosis throughout the cervical spine with  multilevel disc disease and multilevel neural foraminal narrowing. Electronically Signed   By: Marin Olp M.D.   On: 04/29/2017 19:09   Ct Abdomen Pelvis W Contrast  Result Date: 04/26/2017 CLINICAL DATA:  Generalized abdominal pain since yesterday. Diffuse abdominal tenderness. EXAM: CT ABDOMEN AND PELVIS WITH CONTRAST TECHNIQUE: Multidetector CT imaging of the abdomen and pelvis was performed using the standard protocol following bolus administration of intravenous contrast. CONTRAST:  113mL ISOVUE-300 IOPAMIDOL (ISOVUE-300) INJECTION 61% COMPARISON:  Pelvic ultrasound dated 04/17/2017. Abdomen and pelvis CT dated 06/08/2015. FINDINGS: Lower chest: Minimal bilateral dependent atelectasis. Hepatobiliary: 1.4 cm right lobe liver cyst and 0.6 cm left lobe liver cysts. Mild diffuse low density of the liver relative to the spleen. Normal appearing gallbladder. Pancreas: Diffusely atrophied with some progression. Spleen: Normal in size without focal abnormality. Adrenals/Urinary Tract: Normal appearing adrenal glands. Small bilateral renal cysts. Interval moderate dilatation of both renal collecting systems. There is also moderate dilatation of the right  ureter to the level of the superior aspect of a dilated urinary bladder which appears be compressing the ureter. There is mild dilatation of the left ureter to the level of dilated sigmoid colon containing stool and fluid, displaced by the dilated urinary bladder. No urinary tract calculi are seen. Stomach/Bowel: The rectum and sigmoid colon are dilated and filled with fluid and stool. The more proximal portions of the colon far last dilated, containing stool, gas and some fluid. There is a small bowel to right colon anastomosis. No gastric or small bowel dilatation is seen. There is a small to moderate-sized hiatal hernia. Surgically absent appendix. Vascular/Lymphatic: Atheromatous arterial calcifications without aneurysm. No enlarged lymph nodes. Reproductive: Status post hysterectomy. No adnexal masses. Other: Minimal free peritoneal fluid. Musculoskeletal: Mild lumbar and lower thoracic spine degenerative changes. IMPRESSION: 1. Dilated urinary bladder. 2. Rectal fecal impaction with associated dilatation of the sigmoid colon, containing fluid and stool. There is less dilatation of the remainder the colon. 3. Interval moderate bilateral hydronephrosis due to compression of the right ureter by the dilated urinary bladder and the left ureter by the dilated sigmoid colon. 4. Minimal free peritoneal fluid. 5. Mild diffuse hepatic steatosis. 6. Small to moderate-sized hiatal hernia. Electronically Signed   By: Claudie Revering M.D.   On: 04/26/2017 13:45   Dg Chest Port 1 View  Result Date: 05/20/2017 CLINICAL DATA:  83 year old female with fall. EXAM: PORTABLE CHEST 1 VIEW COMPARISON:  Chest radiograph dated 04/15/2017 FINDINGS: There is a linear lucency in the periphery of the right lung which is favored to represent a skin fold. A small pneumothorax is less likely but not entirely excluded. Recommend repeat radiograph with repositioning of the patient. The lungs are clear. There is no pleural effusion. The  cardiac silhouette is within normal limits. There is atherosclerotic calcification of the thoracic aorta. The bones are osteopenic. No acute osseous pathology. IMPRESSION: Artifact versus less likely a small right pneumothorax. Repeat radiograph with better positioning of the patient recommended. No other acute cardiopulmonary process. Electronically Signed   By: Anner [place] M.D.   On: 05/20/2017 00:12   Ct Maxillofacial Wo Contrast  Result Date: 05/20/2017 CLINICAL DATA:  [place] from bed, hit RIGHT side of face on floor. History of dementia. EXAM: CT HEAD WITHOUT CONTRAST CT MAXILLOFACIAL WITHOUT CONTRAST CT CERVICAL SPINE WITHOUT CONTRAST TECHNIQUE: Multidetector CT imaging of the head, cervical spine, and maxillofacial structures were performed using the standard protocol without intravenous contrast. Multiplanar CT image reconstructions of the cervical spine and maxillofacial  structures were also generated. COMPARISON:  CT HEAD and cervical spine April 29, 2017 and MRI of the head April 24, 2017 FINDINGS: CT HEAD FINDINGS BRAIN: No intraparenchymal hemorrhage, mass effect nor midline shift. Moderate to severe ventriculomegaly with disproportionate sulcal effacement at the convexities, narrowed callosal angle. Confluent supratentorial and pontine white matter hypodensities. Old basal ganglia, thalamus lacunar infarcts. Old small LEFT cerebellar infarcts. No acute large vascular territory infarct. No abnormal extra-axial fluid collections. VASCULAR: Mild calcific atherosclerosis of the carotid siphons. SKULL: No skull fracture. No significant scalp soft tissue swelling. OTHER: None. CT MAXILLOFACIAL FINDINGS OSSEOUS: The mandible is intact, the condyles are located. No acute facial fracture. No destructive bony lesions. ORBITS: Ocular globes and orbital contents are nonacute. Status post bilateral ocular lens implants. SINUSES: Minimal debris and sphenoid sinuses. Trace paranasal sinus mucosal  thickening. Status post FESS. Nasal septum is midline. Included mastoid aircells are well aerated. SOFT TISSUES: RIGHT periorbital premalar soft tissue swelling in large RIGHT facial hematoma. No subcutaneous gas or radiopaque foreign bodies. CT CERVICAL SPINE FINDINGS ALIGNMENT: Straightened lordosis. Vertebral bodies in alignment. SKULL BASE AND VERTEBRAE: Cervical vertebral bodies and posterior elements are intact. C3-4 bridging bone mineral density. Severe C5-6 and C6-7 disc height loss with endplate spurring compatible with degenerative discs. C1-2 articulation maintained with advanced atlantodental osteoarthrosis. Calcified apical ligaments. Multilevel moderate to severe LEFT facet arthropathy. No destructive bony lesions. SOFT TISSUES AND SPINAL CANAL: Nonacute. Mild calcific atherosclerosis carotid bifurcations. DISC LEVELS: No significant osseous canal stenosis. Moderate LEFT C2-3, moderate RIGHT C3-4, moderate bilateral C4-5, moderate to severe bilateral C5-6 and C6-7 neural foraminal narrowing. UPPER CHEST: Lung apices are clear. OTHER: None. IMPRESSION: CT HEAD: 1. No acute intracranial process. 2. 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 3. Severe chronic small vessel ischemic disease and scattered small infarct. CT MAXILLOFACIAL: 1. RIGHT periorbital soft tissue swelling/contusion with hematoma. No postseptal extension. 2. No acute facial fracture. CT CERVICAL SPINE: 1. No acute fracture or malalignment. 2. Stable degenerative change of the cervical spine, moderate to severe C5-6 C6-7 neural foraminal narrowing. Electronically Signed   By: Elon Alas M.D.   On: 05/20/2017 00:41     CBC  Recent Labs Lab 05/19/17 2347  WBC 5.4  HGB 9.4*  HCT 28.5*  PLT 346  MCV 97.5  MCH 32.1  MCHC 32.9  RDW 17.8*    Chemistries   Recent Labs Lab 05/19/17 2347  NA 131*  K 3.7  CL 99*  CO2 21*  GLUCOSE 529*  BUN 18  CREATININE 1.14*   CALCIUM 8.9   ------------------------------------------------------------------------------------------------------------------ estimated creatinine clearance is 32.3 mL/min (A) (by C-G formula based on SCr of 1.14 mg/dL (H)). ------------------------------------------------------------------------------------------------------------------ No results for input(s): HGBA1C in the last 72 hours. ------------------------------------------------------------------------------------------------------------------ No results for input(s): CHOL, HDL, LDLCALC, TRIG, CHOLHDL, LDLDIRECT in the last 72 hours. ------------------------------------------------------------------------------------------------------------------  Recent Labs  05/20/17 0558  TSH 0.456   ------------------------------------------------------------------------------------------------------------------ No results for input(s): VITAMINB12, FOLATE, FERRITIN, TIBC, IRON, RETICCTPCT in the last 72 hours.  Coagulation profile No results for input(s): INR, PROTIME in the last 168 hours.  No results for input(s): DDIMER in the last 72 hours.  Cardiac Enzymes  Recent Labs Lab 05/19/17 2347  TROPONINI <0.03   ------------------------------------------------------------------------------------------------------------------ Invalid input(s): POCBNP    Assessment & Plan   Patient is a 83 year old with multiple admissions seen in the ED for fall but noted to have diarrhea  1. Diarrhea: for more than 3 days I will treat with  Flagyl possible C. Difficile due to antigen being positive  2. Diabetes type 2: continue sliding scale insulin and Lantus  3. Previous history of CVA continue therapy with Plavix  4. Hypothyroidism continue Synthroid  5. GERD: continue Pepcid  6. Hyperlipidemia unspecified continue atorvastatin  7. Miscellaneous heaprin DVT prophylaxis      Code Status Orders        Start     Ordered    05/20/17 0528  Full code  Continuous     05/20/17 0527    Code Status History    Date Active Date Inactive Code Status Order ID Comments User Context   05/05/2017  2:36 PM 05/05/2017  6:40 PM Full Code [id number]  Hessie Knows, MD Inpatient   04/15/2017  9:02 PM 04/21/2017  7:16 PM Full Code [id number]  Henreitta Leber, MD Inpatient   04/05/2017  4:08 PM 04/07/2017  9:19 PM Full Code [id number]  [name], MD Inpatient   02/12/2017 10:42 PM 02/16/2017  6:46 PM Full Code [id number]  Lance Coon, MD Inpatient   01/13/2017  8:22 PM 01/16/2017  7:10 PM Full Code [id number]  [place], Ubaldo Glassing, DO Inpatient   11/06/2016 11:26 AM 11/15/2016  2:06 AM DNR [id number]  Flora Lipps, MD Inpatient   11/06/2016  3:25 AM 11/06/2016 11:26 AM Full Code [id number]  Saundra Shelling, MD Inpatient   07/15/2016  6:36 PM 07/17/2016 10:11 PM Full Code [id number]  Fritzi Mandes, MD Inpatient   06/08/2015  9:01 PM 06/11/2015  9:20 PM Full Code [id number]  Henreitta Leber, MD Inpatient   06/08/2015  5:16 PM 06/08/2015  9:01 PM Full Code [id number]  Henreitta Leber, MD ED           Consults  none  DVT Prophylaxis  Lovenox    Lab Results  Component Value Date   PLT 346 05/19/2017     Time Spent in minutes   35min Greater than 50% of time spent in care coordination and counseling patient regarding the condition and plan of care.   Dustin Flock M.D on 05/20/2017 at 2:34 PM  Between 7am to 6pm - Pager - [phone]  After 6pm go to [email or web address] - password [email or web address] [facility]   Office  [phone]

## 2017-05-20 NOTE — H&P (Signed)
[name] [name] is an 83 y.o. female.   Chief Complaint: Fall HPI: The patient with past medical history of COPD, CAD status post MI, history of breast cancer, hypothyroidism and obstructive sleep apnea presents to the emergency department after a fall.  The patient does not recall falling.  She reports that her face hurts in the areas of the obvious contusions.  CT of head and neck shows no acute findings.  She denies chest pain, shortness of breath, nausea, vomiting or diaphoresis.  She admits to feeling lightheaded at times.  The patient's only complaint is loose stool which she has had for at least one day.  Due to her overall debilitated status the emergency department staff, hospitalist service for admission.  Past Medical History:  Diagnosis Date  . Anxiety state, unspecified   . CAD (coronary artery disease)   . Cancer [facility])    2004 Right breast, found on mammogram, radiation therapy, Dr. Bryson Ha, uterine cancer,   . COPD (chronic obstructive pulmonary disease) ([place])   . Diabetes mellitus   . Esophageal reflux   . Heart burn   . Hyperlipidemia   . IBS (irritable bowel syndrome)   . Myocardial infarction [facility])    Cath negative except for 40% occlusion LAD.  Pt not candidate for betablocker or ACEI because of hypotension  . OSA (obstructive sleep apnea)   . Osteoporosis 02/21/09   DEXA scan showed osteoporosis with left femur T-score -2.8.  Marland Kitchen Presbyacusis   . Stroke ([place])   . Thyroid disease    Hypothyroid  . Vitamin D deficiency     Past Surgical History:  Procedure Laterality Date  . ABDOMINAL HYSTERECTOMY     uterine cancer  . BREAST SURGERY    . COLON SURGERY  2013   done at [facility]  . OPEN REDUCTION INTERNAL FIXATION (ORIF) DISTAL RADIAL FRACTURE Right 05/05/2017   Procedure: OPEN REDUCTION INTERNAL FIXATION (ORIF) DISTAL RADIAL FRACTURE;  Surgeon: Hessie Knows, MD;  Location: [facility] ORS;  Service: Orthopedics;  Laterality: Right;    Family History  Problem Relation Age  of Onset  . Diabetes Mother   . Heart attack Father    Social History:  reports that she has never smoked. She has never used smokeless tobacco. She reports that she does not drink alcohol or use drugs.  Allergies:  Allergies  Allergen Reactions  . Penicillins Rash    Has patient had a PCN reaction causing immediate rash, facial/tongue/throat swelling, SOB or lightheadedness with hypotension: No Has patient had a PCN reaction causing severe rash involving mucus membranes or skin necrosis: No Has patient had a PCN reaction that required hospitalization No Has patient had a PCN reaction occurring within the last 10 years: No If all of the above answers are "NO", then may proceed with Cephalosporin use.     Medications Prior to Admission  Medication Sig Dispense Refill  . aspirin EC 81 MG tablet Take 1 tablet (81 mg total) by mouth daily. 90 tablet 3  . atorvastatin (LIPITOR) 80 MG tablet Take 1 tablet (80 mg total) by mouth daily. 90 tablet 3  . clopidogrel (PLAVIX) 75 MG tablet TAKE 1 TABLET(75 MG) BY MOUTH DAILY 90 tablet 3  . fluticasone (FLONASE) 50 MCG/ACT nasal spray Place 2 sprays into both nostrils daily. 16 g 6  . insulin aspart (NOVOLOG) 100 UNIT/ML injection Inject 3 Units into the skin 3 (three) times daily with meals. (Patient taking differently: Inject 0-10 Units into the skin 3 (three) times  daily with meals. 150-200= 3 UNITS 201-250= 5 UNITS 251-300= 7 UNITS 301-350= 8 UNITS 351-400= 10 UNITS) 10 mL 0  . insulin aspart (NOVOLOG) 100 UNIT/ML injection Inject 0-5 Units into the skin at bedtime. (Patient not taking: Reported on 04/26/2017) 10 mL 0  . insulin aspart (NOVOLOG) 100 UNIT/ML injection Inject 0-9 Units into the skin 3 (three) times daily with meals. (Patient not taking: Reported on 04/29/2017) 10 mL 0  . insulin glargine (LANTUS) 100 UNIT/ML injection Inject 0.15 mLs (15 Units total) into the skin daily. (Patient taking differently: Inject 23 Units into the skin at  bedtime. ) 10 mL 0  . levothyroxine (SYNTHROID, LEVOTHROID) 100 MCG tablet TAKE 1 TABLET(100 MCG) BY MOUTH DAILY 90 tablet 3  . loperamide (IMODIUM) 2 MG capsule Take 4 mg by mouth as needed for diarrhea or loose stools.    . metoCLOPramide (REGLAN) 5 MG tablet Take 5 mg by mouth 4 (four) times daily -  before meals and at bedtime.     . midodrine (PROAMATINE) 10 MG tablet Take 1 tablet (10 mg total) by mouth 3 (three) times daily with meals. 90 tablet 1  . oxyCODONE (ROXICODONE) 5 MG immediate release tablet Take 0.5 tablets (2.5 mg total) by mouth every 4 (four) hours as needed. 20 tablet 0  . pantoprazole (PROTONIX) 40 MG tablet Take 1 tablet (40 mg total) by mouth 2 (two) times daily. 180 tablet 3  . sennosides-docusate sodium (SENOKOT-S) 8.6-50 MG tablet Take 2 tablets by mouth daily.    . sertraline (ZOLOFT) 25 MG tablet Take 1 tablet (25 mg total) by mouth daily after breakfast. 30 tablet 1    Results for orders placed or performed during the hospital encounter of 05/19/17 (from the past 48 hour(s))  Basic metabolic panel     Status: Abnormal   Collection Time: 05/19/17 11:47 PM  Result Value Ref Range   Sodium 131 (L) 135 - 145 mmol/L   Potassium 3.7 3.5 - 5.1 mmol/L   Chloride 99 (L) 101 - 111 mmol/L   CO2 21 (L) 22 - 32 mmol/L   Glucose, Bld 529 (HH) 65 - 99 mg/dL    Comment: CRITICAL RESULT CALLED TO, READ BACK BY AND VERIFIED WITH AMY COHEN ON 05/20/17 AT 0033 [facility]    BUN 18 6 - 20 mg/dL   Creatinine, Ser 1.14 (H) 0.44 - 1.00 mg/dL   Calcium 8.9 8.9 - 10.3 mg/dL   GFR calc non Af Amer 43 (L) >60 mL/min   GFR calc Af Amer 50 (L) >60 mL/min    Comment: (NOTE) The eGFR has been calculated using the CKD EPI equation. This calculation has not been validated in all clinical situations. eGFR's persistently <60 mL/min signify possible Chronic Kidney Disease.    Anion gap 11 5 - 15  CBC     Status: Abnormal   Collection Time: 05/19/17 11:47 PM  Result Value Ref Range   WBC 5.4  3.6 - 11.0 K/uL   RBC 2.93 (L) 3.80 - 5.20 MIL/uL   Hemoglobin 9.4 (L) 12.0 - 16.0 g/dL   HCT 28.5 (L) 35.0 - 47.0 %   MCV 97.5 80.0 - 100.0 fL   MCH 32.1 26.0 - 34.0 pg   MCHC 32.9 32.0 - 36.0 g/dL   RDW 17.8 (H) 11.5 - 14.5 %   Platelets 346 150 - 440 K/uL  Troponin I     Status: None   Collection Time: 05/19/17 11:47 PM  Result Value Ref Range  Troponin I <0.03 <0.03 ng/mL  Gastrointestinal Panel by PCR , Stool     Status: None   Collection Time: 05/19/17 11:47 PM  Result Value Ref Range   Campylobacter species NOT DETECTED NOT DETECTED   Plesimonas shigelloides NOT DETECTED NOT DETECTED   Salmonella species NOT DETECTED NOT DETECTED   Yersinia enterocolitica NOT DETECTED NOT DETECTED   Vibrio species NOT DETECTED NOT DETECTED   Vibrio cholerae NOT DETECTED NOT DETECTED   Enteroaggregative E coli (EAEC) NOT DETECTED NOT DETECTED   Enteropathogenic E coli (EPEC) NOT DETECTED NOT DETECTED   Enterotoxigenic E coli (ETEC) NOT DETECTED NOT DETECTED   Shiga like toxin producing E coli (STEC) NOT DETECTED NOT DETECTED   Shigella/Enteroinvasive E coli (EIEC) NOT DETECTED NOT DETECTED   Cryptosporidium NOT DETECTED NOT DETECTED   Cyclospora cayetanensis NOT DETECTED NOT DETECTED   Entamoeba histolytica NOT DETECTED NOT DETECTED   Giardia lamblia NOT DETECTED NOT DETECTED   Adenovirus F40/41 NOT DETECTED NOT DETECTED   Astrovirus NOT DETECTED NOT DETECTED   Norovirus GI/GII NOT DETECTED NOT DETECTED   Rotavirus A NOT DETECTED NOT DETECTED   Sapovirus (I, II, IV, and V) NOT DETECTED NOT DETECTED  C difficile quick scan w PCR reflex     Status: Abnormal   Collection Time: 05/19/17 11:47 PM  Result Value Ref Range   C Diff antigen POSITIVE (A) NEGATIVE   C Diff toxin NEGATIVE NEGATIVE   C Diff interpretation Results are indeterminate. See PCR results.   Clostridium Difficile by PCR     Status: None   Collection Time: 05/19/17 11:47 PM  Result Value Ref Range   Toxigenic C  Difficile by pcr NEGATIVE NEGATIVE    Comment: Patient is colonized with non toxigenic C. difficile. May not need treatment unless significant symptoms are present.  Glucose, capillary     Status: Abnormal   Collection Time: 05/20/17  5:14 AM  Result Value Ref Range   Glucose-Capillary 150 (H) 65 - 99 mg/dL   Comment 1 Notify RN    Ct Head Wo Contrast  Result Date: 05/20/2017 CLINICAL DATA:  [place] from bed, hit RIGHT side of face on floor. History of dementia. EXAM: CT HEAD WITHOUT CONTRAST CT MAXILLOFACIAL WITHOUT CONTRAST CT CERVICAL SPINE WITHOUT CONTRAST TECHNIQUE: Multidetector CT imaging of the head, cervical spine, and maxillofacial structures were performed using the standard protocol without intravenous contrast. Multiplanar CT image reconstructions of the cervical spine and maxillofacial structures were also generated. COMPARISON:  CT HEAD and cervical spine April 29, 2017 and MRI of the head April 24, 2017 FINDINGS: CT HEAD FINDINGS BRAIN: No intraparenchymal hemorrhage, mass effect nor midline shift. Moderate to severe ventriculomegaly with disproportionate sulcal effacement at the convexities, narrowed callosal angle. Confluent supratentorial and pontine white matter hypodensities. Old basal ganglia, thalamus lacunar infarcts. Old small LEFT cerebellar infarcts. No acute large vascular territory infarct. No abnormal extra-axial fluid collections. VASCULAR: Mild calcific atherosclerosis of the carotid siphons. SKULL: No skull fracture. No significant scalp soft tissue swelling. OTHER: None. CT MAXILLOFACIAL FINDINGS OSSEOUS: The mandible is intact, the condyles are located. No acute facial fracture. No destructive bony lesions. ORBITS: Ocular globes and orbital contents are nonacute. Status post bilateral ocular lens implants. SINUSES: Minimal debris and sphenoid sinuses. Trace paranasal sinus mucosal thickening. Status post FESS. Nasal septum is midline. Included mastoid aircells are well  aerated. SOFT TISSUES: RIGHT periorbital premalar soft tissue swelling in large RIGHT facial hematoma. No subcutaneous gas or radiopaque foreign bodies. CT CERVICAL  SPINE FINDINGS ALIGNMENT: Straightened lordosis. Vertebral bodies in alignment. SKULL BASE AND VERTEBRAE: Cervical vertebral bodies and posterior elements are intact. C3-4 bridging bone mineral density. Severe C5-6 and C6-7 disc height loss with endplate spurring compatible with degenerative discs. C1-2 articulation maintained with advanced atlantodental osteoarthrosis. Calcified apical ligaments. Multilevel moderate to severe LEFT facet arthropathy. No destructive bony lesions. SOFT TISSUES AND SPINAL CANAL: Nonacute. Mild calcific atherosclerosis carotid bifurcations. DISC LEVELS: No significant osseous canal stenosis. Moderate LEFT C2-3, moderate RIGHT C3-4, moderate bilateral C4-5, moderate to severe bilateral C5-6 and C6-7 neural foraminal narrowing. UPPER CHEST: Lung apices are clear. OTHER: None. IMPRESSION: CT HEAD: 1. No acute intracranial process. 2. 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 3. Severe chronic small vessel ischemic disease and scattered small infarct. CT MAXILLOFACIAL: 1. RIGHT periorbital soft tissue swelling/contusion with hematoma. No postseptal extension. 2. No acute facial fracture. CT CERVICAL SPINE: 1. No acute fracture or malalignment. 2. Stable degenerative change of the cervical spine, moderate to severe C5-6 C6-7 neural foraminal narrowing. Electronically Signed   By: Elon Alas M.D.   On: 05/20/2017 00:41   Ct Cervical Spine Wo Contrast  Result Date: 05/20/2017 CLINICAL DATA:  [place] [address] from bed, hit RIGHT side of face on floor. History of dementia. EXAM: CT HEAD WITHOUT CONTRAST CT MAXILLOFACIAL WITHOUT CONTRAST CT CERVICAL SPINE WITHOUT CONTRAST TECHNIQUE: Multidetector CT imaging of the head, cervical spine, and maxillofacial structures were performed using  the standard protocol without intravenous contrast. Multiplanar CT image reconstructions of the cervical spine and maxillofacial structures were also generated. COMPARISON:  CT HEAD and cervical spine April 29, 2017 and MRI of the head April 24, 2017 FINDINGS: CT HEAD FINDINGS BRAIN: No intraparenchymal hemorrhage, mass effect nor midline shift. Moderate to severe ventriculomegaly with disproportionate sulcal effacement at the convexities, narrowed callosal angle. Confluent supratentorial and pontine white matter hypodensities. Old basal ganglia, thalamus lacunar infarcts. Old small LEFT cerebellar infarcts. No acute large vascular territory infarct. No abnormal extra-axial fluid collections. VASCULAR: Mild calcific atherosclerosis of the carotid siphons. SKULL: No skull fracture. No significant scalp soft tissue swelling. OTHER: None. CT MAXILLOFACIAL FINDINGS OSSEOUS: The mandible is intact, the condyles are located. No acute facial fracture. No destructive bony lesions. ORBITS: Ocular globes and orbital contents are nonacute. Status post bilateral ocular lens implants. SINUSES: Minimal debris and sphenoid sinuses. Trace paranasal sinus mucosal thickening. Status post FESS. Nasal septum is midline. Included mastoid aircells are well aerated. SOFT TISSUES: RIGHT periorbital premalar soft tissue swelling in large RIGHT facial hematoma. No subcutaneous gas or radiopaque foreign bodies. CT CERVICAL SPINE FINDINGS ALIGNMENT: Straightened lordosis. Vertebral bodies in alignment. SKULL BASE AND VERTEBRAE: Cervical vertebral bodies and posterior elements are intact. C3-4 bridging bone mineral density. Severe C5-6 and C6-7 disc height loss with endplate spurring compatible with degenerative discs. C1-2 articulation maintained with advanced atlantodental osteoarthrosis. Calcified apical ligaments. Multilevel moderate to severe LEFT facet arthropathy. No destructive bony lesions. SOFT TISSUES AND SPINAL CANAL: Nonacute.  Mild calcific atherosclerosis carotid bifurcations. DISC LEVELS: No significant osseous canal stenosis. Moderate LEFT C2-3, moderate RIGHT C3-4, moderate bilateral C4-5, moderate to severe bilateral C5-6 and C6-7 neural foraminal narrowing. UPPER CHEST: Lung apices are clear. OTHER: None. IMPRESSION: CT HEAD: 1. No acute intracranial process. 2. 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 3. Severe chronic small vessel ischemic disease and scattered small infarct. CT MAXILLOFACIAL: 1. RIGHT periorbital soft tissue swelling/contusion with hematoma. No postseptal extension. 2. No acute facial  fracture. CT CERVICAL SPINE: 1. No acute fracture or malalignment. 2. Stable degenerative change of the cervical spine, moderate to severe C5-6 C6-7 neural foraminal narrowing. Electronically Signed   By: Elon Alas M.D.   On: 05/20/2017 00:41   Dg Chest Port 1 View  Result Date: 05/20/2017 CLINICAL DATA:  83 year old female with fall. EXAM: PORTABLE CHEST 1 VIEW COMPARISON:  Chest radiograph dated 04/15/2017 FINDINGS: There is a linear lucency in the periphery of the right lung which is favored to represent a skin fold. A small pneumothorax is less likely but not entirely excluded. Recommend repeat radiograph with repositioning of the patient. The lungs are clear. There is no pleural effusion. The cardiac silhouette is within normal limits. There is atherosclerotic calcification of the thoracic aorta. The bones are osteopenic. No acute osseous pathology. IMPRESSION: Artifact versus less likely a small right pneumothorax. Repeat radiograph with better positioning of the patient recommended. No other acute cardiopulmonary process. Electronically Signed   By: Anner Crete M.D.   On: 05/20/2017 00:12   Ct Maxillofacial Wo Contrast  Result Date: 05/20/2017 CLINICAL DATA:  [place] from bed, hit RIGHT side of face on floor. History of dementia. EXAM: CT HEAD WITHOUT  CONTRAST CT MAXILLOFACIAL WITHOUT CONTRAST CT CERVICAL SPINE WITHOUT CONTRAST TECHNIQUE: Multidetector CT imaging of the head, cervical spine, and maxillofacial structures were performed using the standard protocol without intravenous contrast. Multiplanar CT image reconstructions of the cervical spine and maxillofacial structures were also generated. COMPARISON:  CT HEAD and cervical spine April 29, 2017 and MRI of the head April 24, 2017 FINDINGS: CT HEAD FINDINGS BRAIN: No intraparenchymal hemorrhage, mass effect nor midline shift. Moderate to severe ventriculomegaly with disproportionate sulcal effacement at the convexities, narrowed callosal angle. Confluent supratentorial and pontine white matter hypodensities. Old basal ganglia, thalamus lacunar infarcts. Old small LEFT cerebellar infarcts. No acute large vascular territory infarct. No abnormal extra-axial fluid collections. VASCULAR: Mild calcific atherosclerosis of the carotid siphons. SKULL: No skull fracture. No significant scalp soft tissue swelling. OTHER: None. CT MAXILLOFACIAL FINDINGS OSSEOUS: The mandible is intact, the condyles are located. No acute facial fracture. No destructive bony lesions. ORBITS: Ocular globes and orbital contents are nonacute. Status post bilateral ocular lens implants. SINUSES: Minimal debris and sphenoid sinuses. Trace paranasal sinus mucosal thickening. Status post FESS. Nasal septum is midline. Included mastoid aircells are well aerated. SOFT TISSUES: RIGHT periorbital premalar soft tissue swelling in large RIGHT facial hematoma. No subcutaneous gas or radiopaque foreign bodies. CT CERVICAL SPINE FINDINGS ALIGNMENT: Straightened lordosis. Vertebral bodies in alignment. SKULL BASE AND VERTEBRAE: Cervical vertebral bodies and posterior elements are intact. C3-4 bridging bone mineral density. Severe C5-6 and C6-7 disc height loss with endplate spurring compatible with degenerative discs. C1-2 articulation maintained  with advanced atlantodental osteoarthrosis. Calcified apical ligaments. Multilevel moderate to severe LEFT facet arthropathy. No destructive bony lesions. SOFT TISSUES AND SPINAL CANAL: Nonacute. Mild calcific atherosclerosis carotid bifurcations. DISC LEVELS: No significant osseous canal stenosis. Moderate LEFT C2-3, moderate RIGHT C3-4, moderate bilateral C4-5, moderate to severe bilateral C5-6 and C6-7 neural foraminal narrowing. UPPER CHEST: Lung apices are clear. OTHER: None. IMPRESSION: CT HEAD: 1. No acute intracranial process. 2. 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 3. Severe chronic small vessel ischemic disease and scattered small infarct. CT MAXILLOFACIAL: 1. RIGHT periorbital soft tissue swelling/contusion with hematoma. No postseptal extension. 2. No acute facial fracture. CT CERVICAL SPINE: 1. No acute fracture or malalignment. 2. Stable degenerative change of the cervical spine,  moderate to severe C5-6 C6-7 neural foraminal narrowing. Electronically Signed   By: Elon Alas M.D.   On: 05/20/2017 00:41    Review of Systems  Constitutional: Negative for chills and fever.  HENT: Negative for sore throat and tinnitus.   Eyes: Negative for blurred vision and redness.  Respiratory: Negative for cough and shortness of breath.   Cardiovascular: Negative for chest pain, palpitations, orthopnea and PND.  Gastrointestinal: Positive for abdominal pain and diarrhea. Negative for nausea and vomiting.  Genitourinary: Negative for dysuria, frequency and urgency.  Musculoskeletal: Negative for joint pain and myalgias.  Skin: Negative for rash.       No lesions  Neurological: Negative for speech change, focal weakness and weakness.  Endo/Heme/Allergies: Does not bruise/bleed easily.       No temperature intolerance  Psychiatric/Behavioral: Negative for depression and suicidal ideas.    Blood pressure (!) 101/52, pulse 80, temperature 98.2  F (36.8 C), temperature source Oral, resp. rate 13, weight 54.4 kg (120 lb), SpO2 96 %. Physical Exam  Vitals reviewed. Constitutional: She is oriented to person, place, and time. She appears well-developed and well-nourished. No distress.  HENT:  Head: Normocephalic and atraumatic.  Mouth/Throat: Oropharynx is clear and moist.  Eyes: Conjunctivae and EOM are normal. Pupils are equal, round, and reactive to light. No scleral icterus.  Neck: Normal range of motion. Neck supple. No JVD present. No tracheal deviation present. No thyromegaly present.  Cardiovascular: Normal rate, regular rhythm and normal heart sounds. Exam reveals no gallop and no friction rub.  No murmur heard. Respiratory: Effort normal and breath sounds normal.  GI: Soft. Bowel sounds are normal. She exhibits no distension. There is no tenderness.  Genitourinary:  Genitourinary Comments: Deferred  Musculoskeletal: Normal range of motion. She exhibits no edema.  Lymphadenopathy:    She has no cervical adenopathy.  Neurological: She is alert and oriented to person, place, and time. No cranial nerve deficit. She exhibits normal muscle tone.  Skin: Skin is warm and dry. No rash noted. No erythema.  Psychiatric: She has a normal mood and affect. Her behavior is normal. Judgment and thought content normal.     Assessment/Plan This is an 83 year old female admitted for C. difficile colitis. 1. C. difficile colitis: oral vancomycin; supportive care 2. Acute kidney injury: hydrate with intravenous fluid; avoid nephrotoxic agents 3. Diabetes mellitus type 2: continue basal insulin; sliding scale insulin while hospitalized 4. Hypothyroidism: continue Synthroid. Check TSH 5. CAD: stable; continue aspirin and Plavix 6. Hyperlipidemia: continue statin therapy 7. Hyponatremia: continue midodrine for lightheadedness 8. Depression: continue Zoloft 9. DVT prophylaxis: heparin 10. GI prophylaxis: none The patient is a FULL CODE.  Time spent on admission orders and patient care approximately 45 minutes  Harrie Foreman, MD 05/20/2017, 5:38 AM

## 2017-05-20 NOTE — Clinical Social Work Note (Signed)
Clinical Social Work Assessment  Patient Details  Name: [name] [name] MRN: [id number] Date of Birth: 12-04-1931  Date of referral:  05/20/17               Reason for consult:  Other (Comment Required) (From [facility] unit. )                Permission sought to share information with:  Facility Art therapist granted to share information::  Yes, Verbal Permission Granted  Name::      [facility]   Agency::     Relationship::     Contact Information:     Housing/Transportation Living arrangements for the past 2 months:  [facility], [place] of Information:  Adult Children, Facility Patient Interpreter Needed:  None Criminal Activity/Legal Involvement Pertinent to Current Situation/Hospitalization:  No - Comment as needed Significant Relationships:  Adult Children Lives with:  Facility Resident Do you feel safe going back to the place where you live?    Need for family participation in patient care:  Yes (Comment)  Care giving concerns:  Patient is a resident at [facility] located at [facility]. [address], [place] (fax: [phone]).    Social Worker assessment / plan:  Holiday representative (CSW) reviewed chart and noted that patient is from [facility]. CSW contacted [facility] to get additional information. Per Thayer Headings patient just moved into the [facility] unit on Monday 05/18/17. Per Thayer Headings patient has been in a wheel chair waiting on home health PT through [facility] to evaluate her. Per Thayer Headings patient is on room air at baseline and her daughter [name] is the main contact. CSW made Thayer Headings aware that per nurse patient is negative for c-diff. Thayer Headings reported that patient can return to [facility] when stable for D/C. CSW contacted patient's son [name] because per chart patient is not alert and oriented. Per [name] he is not sure if  patient has a HPOA and stated that him and [name] are the main contacts. Per [name] before patient moved into [facility] she was at [facility] for short term rehab. [name] is agreeable for patient to return to [facility]. CSW will continue to follow and assist as needed.   Employment status:  Disabled (Comment on whether or not currently receiving Disability) Insurance information:  Managed Medicare PT Recommendations:  Not assessed at this time Information / Referral to community resources:  Other (Comment Required) (Patient will return to [facility] )  Patient/Family's Response to care:  Patient's son [name] is agreeable for patient to return to [facility] memory care unit.   Patient/Family's Understanding of and Emotional Response to Diagnosis, Current Treatment, and Prognosis:  Patient's son was very pleasant and thanked CSW for assistance.   Emotional Assessment Appearance:  Appears stated age Attitude/Demeanor/Rapport:  Unable to Assess Affect (typically observed):  Unable to Assess Orientation:  Oriented to Self, Fluctuating Orientation (Suspected and/or reported Sundowners), Oriented to Place Alcohol / Substance use:  Not Applicable Psych involvement (Current and /or in the community):  No (Comment)  Discharge Needs  Concerns to be addressed:  Discharge Planning Concerns Readmission within the last 30 days:  No Current discharge risk:  Dependent with Mobility Barriers to Discharge:  Continued Medical Work up   [employer], Veronia Beets, LCSW 05/20/2017, 8:42 AM

## 2017-05-20 NOTE — NC FL2 (Signed)
[place] LEVEL OF CARE SCREENING TOOL     IDENTIFICATION  Patient Name: [name] [name] Birthdate: 25-Aug-1931 Sex: female Admission Date (Current Location): 05/19/2017  [place] and [place] Number:  Engineering geologist and Address:  [facility], [address] [place],  [address]      Provider Number: [id number]  Attending Physician Name and Address:  Dustin Flock, MD  Relative Name and Phone Number:       Current Level of Care: Hospital Recommended Level of Care: [place]  Memory Care  Prior Approval Number:    Date Approved/Denied:   PASRR Number:  ([id number])  Discharge Plan: Domiciliary (Rest home)  Memory Care    Current Diagnoses: Primary: Dementia, vascular Patient Active Problem List   Diagnosis Date Noted  . C. difficile colitis 05/20/2017  . Hyperglycemia   . Acute kidney injury ([place])   . Metabolic encephalopathy 30/16/0109  . Adjustment disorder with mixed anxiety and depressed mood   . Coffee ground emesis 02/12/2017  . Acute on chronic renal failure ([place]) 01/13/2017  . Dementia, vascular 12/03/2016  . Stroke ([place]) 08/20/2016  . Peripheral vascular disease ([place]) 04/09/2016  . DKA (diabetic ketoacidoses) ([place]) 06/08/2015  . CKD stage 3 due to type 2 diabetes mellitus ([place]) 02/22/2014  . Hypothyroidism 09/23/2012  . GERD (gastroesophageal reflux disease) 09/23/2012  . Anxiety 07/26/2012  . Hyperlipidemia with target LDL less than 70 12/03/2011  . DM (diabetes mellitus) type II uncontrolled, periph vascular disorder ([place]) 04/03/2011  . CAD (coronary artery disease) 12/13/2009    Orientation RESPIRATION BLADDER Height & Weight     Self, Time, Situation, Place  Normal Continent Weight: 122 lb 9.6 oz (55.6 kg) Height:  5\' 4"  (162.6 cm)  BEHAVIORAL SYMPTOMS/MOOD NEUROLOGICAL BOWEL NUTRITION STATUS      Continent Diet (Diet: Heart Healthy/ Carb Modified. )  AMBULATORY STATUS  COMMUNICATION OF NEEDS Skin   Extensive Assist Verbally Other (Comment) (Laceration to Left Arm. )                       Personal Care Assistance Level of Assistance  Bathing, Feeding, Dressing Bathing Assistance: Limited assistance Feeding assistance: Independent Dressing Assistance: Limited assistance     Functional Limitations Info  Sight, Hearing, Speech Sight Info: Adequate Hearing Info: Adequate Speech Info: Adequate    SPECIAL CARE FACTORS FREQUENCY  PT (By licensed PT)     PT Frequency:  (2-3 home health )              Contractures      Additional Factors Info  Code Status, Allergies Code Status Info:  (Full Code. ) Allergies Info:  (Penicillins. )     Isolation Precautions Info:  (Patient is negative for C-diff. )     Current Medications (05/20/2017):  This is the current hospital active medication list Current Facility-Administered Medications  Medication Dose Route Frequency Provider Last Rate Last Dose  . 0.9 %  sodium chloride infusion   Intravenous Continuous Harrie Foreman, MD 100 mL/hr at 05/20/17 0620    . acetaminophen (TYLENOL) tablet 650 mg  650 mg Oral Q6H PRN Harrie Foreman, MD   650 mg at 05/20/17 3235   Or  . acetaminophen (TYLENOL) suppository 650 mg  650 mg Rectal Q6H PRN Harrie Foreman, MD      . aspirin EC tablet 81 mg  81 mg Oral Daily Harrie Foreman, MD      .  atorvastatin (LIPITOR) tablet 80 mg  80 mg Oral Daily Harrie Foreman, MD      . clopidogrel (PLAVIX) tablet 75 mg  75 mg Oral Daily Harrie Foreman, MD      . docusate sodium (COLACE) capsule 100 mg  100 mg Oral BID Harrie Foreman, MD      . fluticasone [facility]) 50 MCG/ACT nasal spray 2 spray  2 spray Each Nare Daily Harrie Foreman, MD      . heparin injection 5,000 Units  5,000 Units Subcutaneous Q8H Harrie Foreman, MD   5,000 Units at 05/20/17 [phone]  . insulin aspart (novoLOG) injection 0-9 Units  0-9 Units Subcutaneous TID WC Harrie Foreman, MD   2 Units at 05/20/17 [phone]  . insulin glargine (LANTUS) injection 10 Units  10 Units Subcutaneous QHS Harrie Foreman, MD      . levothyroxine (SYNTHROID, LEVOTHROID) tablet 100 mcg  100 mcg Oral QAC breakfast Harrie Foreman, MD   100 mcg at 05/20/17 0813  . loperamide (IMODIUM) capsule 4 mg  4 mg Oral PRN Harrie Foreman, MD      . metoCLOPramide (REGLAN) tablet 5 mg  5 mg Oral TID AC & HS Harrie Foreman, MD   5 mg at 05/20/17 0813  . metroNIDAZOLE (FLAGYL) tablet 500 mg  500 mg Oral Q8H Harrie Foreman, MD   500 mg at 05/20/17 5997  . midodrine (PROAMATINE) tablet 10 mg  10 mg Oral TID WC Harrie Foreman, MD   10 mg at 05/20/17 0826  . ondansetron (ZOFRAN) tablet 4 mg  4 mg Oral Q6H PRN Harrie Foreman, MD       Or  . ondansetron [facility]) injection 4 mg  4 mg Intravenous Q6H PRN Harrie Foreman, MD      . oxyCODONE (Oxy IR/ROXICODONE) immediate release tablet 2.5 mg  2.5 mg Oral Q4H PRN Harrie Foreman, MD      . pantoprazole (PROTONIX) EC tablet 40 mg  40 mg Oral BID Harrie Foreman, MD      . senna-docusate (Senokot-S) tablet 2 tablet  2 tablet Oral Daily Harrie Foreman, MD      . sertraline (ZOLOFT) tablet 25 mg  25 mg Oral QPC breakfast Harrie Foreman, MD   25 mg at 05/20/17 7414     Discharge Medications: Please see discharge summary for a list of discharge medications.  Relevant Imaging Results:  Relevant Lab Results:   Additional Information  (SSN: [id number])  Penni Penado, Veronia Beets, LCSW

## 2017-05-21 ENCOUNTER — Encounter: Payer: Self-pay | Admitting: *Deleted

## 2017-05-21 ENCOUNTER — Other Ambulatory Visit: Payer: Self-pay | Admitting: *Deleted

## 2017-05-21 DIAGNOSIS — M6281 Muscle weakness (generalized): Secondary | ICD-10-CM | POA: Diagnosis not present

## 2017-05-21 DIAGNOSIS — Z79899 Other long term (current) drug therapy: Secondary | ICD-10-CM | POA: Diagnosis not present

## 2017-05-21 DIAGNOSIS — Z7951 Long term (current) use of inhaled steroids: Secondary | ICD-10-CM | POA: Diagnosis not present

## 2017-05-21 DIAGNOSIS — E785 Hyperlipidemia, unspecified: Secondary | ICD-10-CM | POA: Diagnosis not present

## 2017-05-21 DIAGNOSIS — Z794 Long term (current) use of insulin: Secondary | ICD-10-CM | POA: Diagnosis not present

## 2017-05-21 DIAGNOSIS — Z7401 Bed confinement status: Secondary | ICD-10-CM | POA: Diagnosis not present

## 2017-05-21 DIAGNOSIS — K7689 Other specified diseases of liver: Secondary | ICD-10-CM | POA: Diagnosis not present

## 2017-05-21 DIAGNOSIS — A0472 Enterocolitis due to Clostridium difficile, not specified as recurrent: Secondary | ICD-10-CM | POA: Diagnosis not present

## 2017-05-21 DIAGNOSIS — S0083XA Contusion of other part of head, initial encounter: Secondary | ICD-10-CM | POA: Diagnosis not present

## 2017-05-21 DIAGNOSIS — W19XXXA Unspecified fall, initial encounter: Secondary | ICD-10-CM | POA: Diagnosis not present

## 2017-05-21 DIAGNOSIS — Z7982 Long term (current) use of aspirin: Secondary | ICD-10-CM | POA: Diagnosis not present

## 2017-05-21 DIAGNOSIS — Z8249 Family history of ischemic heart disease and other diseases of the circulatory system: Secondary | ICD-10-CM | POA: Diagnosis not present

## 2017-05-21 DIAGNOSIS — Z833 Family history of diabetes mellitus: Secondary | ICD-10-CM | POA: Diagnosis not present

## 2017-05-21 DIAGNOSIS — Z8673 Personal history of transient ischemic attack (TIA), and cerebral infarction without residual deficits: Secondary | ICD-10-CM | POA: Diagnosis not present

## 2017-05-21 DIAGNOSIS — E039 Hypothyroidism, unspecified: Secondary | ICD-10-CM | POA: Diagnosis not present

## 2017-05-21 DIAGNOSIS — E1310 Other specified diabetes mellitus with ketoacidosis without coma: Secondary | ICD-10-CM | POA: Diagnosis not present

## 2017-05-21 DIAGNOSIS — E119 Type 2 diabetes mellitus without complications: Secondary | ICD-10-CM | POA: Diagnosis not present

## 2017-05-21 DIAGNOSIS — K219 Gastro-esophageal reflux disease without esophagitis: Secondary | ICD-10-CM | POA: Diagnosis not present

## 2017-05-21 LAB — GLUCOSE, CAPILLARY
Glucose-Capillary: 66 mg/dL (ref 65–99)
Glucose-Capillary: 109 mg/dL — ABNORMAL HIGH (ref 65–99)
Glucose-Capillary: 210 mg/dL — ABNORMAL HIGH (ref 65–99)

## 2017-05-21 LAB — BASIC METABOLIC PANEL
ANION GAP: 5 (ref 5–15)
BUN: 11 mg/dL (ref 6–20)
CALCIUM: 8.1 mg/dL — AB (ref 8.9–10.3)
CO2: 23 mmol/L (ref 22–32)
CREATININE: 0.9 mg/dL (ref 0.44–1.00)
Chloride: 109 mmol/L (ref 101–111)
GFR, EST NON AFRICAN AMERICAN: 58 mL/min — AB (ref >60)
Glucose, Bld: 81 mg/dL (ref 65–99)
Potassium: 3.4 mmol/L — ABNORMAL LOW (ref 3.5–5.1)
SODIUM: 137 mmol/L (ref 135–145)

## 2017-05-21 LAB — CBC
HCT: 23.4 % — ABNORMAL LOW (ref 35.0–47.0)
HEMOGLOBIN: 7.8 g/dL — AB (ref 12.0–16.0)
MCH: 32.3 pg (ref 26.0–34.0)
MCHC: 33.4 g/dL (ref 32.0–36.0)
MCV: 96.7 fL (ref 80.0–100.0)
Platelets: 289 10*3/uL (ref 150–440)
RBC: 2.42 MIL/uL — ABNORMAL LOW (ref 3.80–5.20)
RDW: 17.5 % — AB (ref 11.5–14.5)
WBC: 4.4 10*3/uL (ref 3.6–11.0)

## 2017-05-21 MED ORDER — POTASSIUM CHLORIDE CRYS ER 20 MEQ PO TBCR
20.0000 meq | EXTENDED_RELEASE_TABLET | Freq: Once | ORAL | Status: AC
  Administered 2017-05-21: 20 meq via ORAL
  Filled 2017-05-21: qty 1

## 2017-05-21 MED ORDER — METRONIDAZOLE 500 MG PO TABS: 500.0000 mg | ORAL_TABLET | Freq: Three times a day (TID) | ORAL | 0 refills | Status: AC

## 2017-05-21 MED ORDER — INSULIN GLARGINE 100 UNIT/ML ~~LOC~~ SOLN: 8.0000 [IU] | Freq: Every day | SUBCUTANEOUS | 0 refills | Status: DC

## 2017-05-21 NOTE — Progress Notes (Signed)
Patient is medically stable for D/C back to [facility] memory care today. Per [facility] administrator patient can return today and requested for CSW to submit for ALF PASARR. CSW submitted for ALF PASARR. RN will call report and arrange EMS for transport. CSW sent D/C summary and FL2 to [facility]. CSW contacted patient's daughter [name] and made her aware of above. Please reconsult if future social work needs arise. CSW signing off.   [employer], LCSW [phone]

## 2017-05-21 NOTE — Patient Outreach (Signed)
[facility]) Care Management  05/21/2017  JACOBI RYANT 1932/06/30 [id number]   Patient discharged to [facility] Unit post discharge from [facility]. Patient to be closed to [facility] at this time.   Letter to be sent to patient's provider's office notifying them of case closure.   Sheralyn Boatman [facility] Care Management [phone]

## 2017-05-21 NOTE — NC FL2 (Signed)
[place] LEVEL OF CARE SCREENING TOOL     IDENTIFICATION  Patient Name: [name] [name] Birthdate: November 18, 1931 Sex: female Admission Date (Current Location): 05/19/2017  [place] and [place] Number:  Engineering geologist and Address:  [facility], [address], [place], [place] [address]      Provider Number: [id number]  Attending Physician Name and Address:  Dustin Flock, MD  Relative Name and Phone Number:       Current Level of Care: Hospital Recommended Level of Care: [place]  Memory Care  Prior Approval Number:    Date Approved/Denied:   PASRR Number:   Discharge Plan: Domiciliary (Rest home)  Memory Care     Current Diagnoses: Primary: Vascular Dementia  Patient Active Problem List   Diagnosis Date Noted  . C. difficile colitis 05/20/2017  . Hyperglycemia   . Acute kidney injury ([place])   . Metabolic encephalopathy 72/53/6644  . Adjustment disorder with mixed anxiety and depressed mood   . Coffee ground emesis 02/12/2017  . Acute on chronic renal failure ([place]) 01/13/2017  . Dementia, vascular 12/03/2016  . Stroke ([place]) 08/20/2016  . Peripheral vascular disease ([place]) 04/09/2016  . DKA (diabetic ketoacidoses) ([place]) 06/08/2015  . CKD stage 3 due to type 2 diabetes mellitus ([place]) 02/22/2014  . Hypothyroidism 09/23/2012  . GERD (gastroesophageal reflux disease) 09/23/2012  . Anxiety 07/26/2012  . Hyperlipidemia with target LDL less than 70 12/03/2011  . DM (diabetes mellitus) type II uncontrolled, periph vascular disorder ([place]) 04/03/2011  . CAD (coronary artery disease) 12/13/2009    Orientation RESPIRATION BLADDER Height & Weight     Self, Time, Situation, Place  Normal Continent Weight: 128 lb 11.2 oz (58.4 kg) Height:  5\' 4"  (162.6 cm)  BEHAVIORAL SYMPTOMS/MOOD NEUROLOGICAL BOWEL NUTRITION STATUS      Continent Diet (Diet: Heart Healthy/ Carb Modified. )  AMBULATORY STATUS COMMUNICATION OF  NEEDS Skin   Extensive Assist Verbally Other (Comment) (Laceration to Left Arm. )                       Personal Care Assistance Level of Assistance  Bathing, Feeding, Dressing Bathing Assistance: Limited assistance Feeding assistance: Independent Dressing Assistance: Limited assistance     Functional Limitations Info  Sight, Hearing, Speech Sight Info: Adequate Hearing Info: Adequate Speech Info: Adequate    SPECIAL CARE FACTORS FREQUENCY                     Contractures      Additional Factors Info  Code Status, Allergies Code Status Info:  (Full Code. ) Allergies Info:  (Penicillins. )     Isolation Precautions Info:  (Patient is negative for C-diff. )    Discharge Medications: Please see discharge summary for a list of discharge medications. Medication List             STOP taking these medications            polyethylene glycol packet Commonly known as:  MIRALAX / GLYCOLAX    sennosides-docusate sodium 8.6-50 MG tablet Commonly known as:  SENOKOT-S                       TAKE these medications            acetaminophen 500 MG tablet Commonly known as:  TYLENOL Take 1,000 mg by mouth every 8 (eight) hours.    aspirin 81 MG chewable  tablet Chew 81 mg by mouth daily.    atorvastatin 80 MG tablet Commonly known as:  LIPITOR Take 1 tablet (80 mg total) by mouth daily.    clopidogrel 75 MG tablet Commonly known as:  PLAVIX TAKE 1 TABLET(75 MG) BY MOUTH DAILY    fluticasone 50 MCG/ACT nasal spray Commonly known as:  FLONASE Place 2 sprays into both nostrils daily.    insulin aspart 100 UNIT/ML injection Commonly known as:  novoLOG Inject 3 Units into the skin 3 (three) times daily with meals. What changed:  how much to take  additional instructions    insulin glargine 100 UNIT/ML injection Commonly known as:  LANTUS Inject 0.15 mLs (15 Units total) into the skin daily. What changed:  how much to take  when to take  this    levothyroxine 100 MCG tablet Commonly known as:  SYNTHROID, LEVOTHROID TAKE 1 TABLET(100 MCG) BY MOUTH DAILY    metoCLOPramide 5 MG tablet Commonly known as:  REGLAN Take 5 mg by mouth 4 (four) times daily -  before meals and at bedtime.    metroNIDAZOLE 500 MG tablet Commonly known as:  FLAGYL Take 1 tablet (500 mg total) by mouth every 8 (eight) hours.    midodrine 10 MG tablet Commonly known as:  PROAMATINE Take 1 tablet (10 mg total) by mouth 3 (three) times daily with meals.    oxyCODONE 5 MG immediate release tablet Commonly known as:  ROXICODONE Take 0.5 tablets (2.5 mg total) by mouth every 4 (four) hours as needed. What changed:  when to take this  reasons to take this    sertraline 25 MG tablet Commonly known as:  ZOLOFT Take 1 tablet (25 mg total) by mouth daily after breakfast.    Relevant Imaging Results: Relevant Lab Results: Additional Information  (SSN: [id number])  Shameer Molstad, Veronia Beets, LCSW

## 2017-05-21 NOTE — Discharge Summary (Addendum)
[place] at [facility], 83 y.o., DOB 1932/01/19, MRN [id number]. Admission date: 05/19/2017 Discharge Date 05/21/2017 Primary MD [name], DO Admitting Physician Harrie Foreman, MD  Admission Diagnosis  C. difficile colitis [A04.72] Facial contusion, initial encounter [S00.83XA]  Discharge Diagnosis   Active Problems:   Diarrhea possible C. difficile   Status post fall   Previous history of CVA   Hypothyroidism   gerd   Upper lipidemia nonspecified   Hypokalemia   Anemia of chronic disease      [place] is a 83 y.o. female with below list of chronic medical conditions presents to the emergency department via EMS from [place] with history of rolling out of bed with resultant right facial contusion and swelling.patient does not recall injury. Patient apparently was having diarrhea for 3 days prior to this episode. Therefore in the emergency room C. difficile test was checked. She was noted to have C. difficile antigen positive. However she is also on stool softeners. She is been treated with Flagyl for diarrhea as has since resolved. Patient currently is back to baseline.           Consults  None  Significant Tests:  See full reports for all details     Dg Forearm Right  Result Date: 04/29/2017 CLINICAL DATA:  Fall EXAM: RIGHT FOREARM - 2 VIEW COMPARISON:  04/06/2011 FINDINGS: Osteopenia. Acute, comminuted impacted intra-articular distal radius fracture. About 1/3 bone with of dorsal displacement of distal fracture fragment. Mild dorsal and radial angulation of distal fracture fragments. Additional comminuted fracture involving the ulnar styloid and distal shaft and metaphysis of the ulna. This also demonstrates about 1/2 shaft diameter of dorsal displacement and radial displacement of distal fracture fragment. Mild dorsal angulation of distal fracture fragment. IMPRESSION: 1.  Acute, comminuted displaced and angulated intra-articular distal radius fracture 2. Acute comminuted displaced and angulated distal ulna fracture Electronically Signed   By: Donavan Foil M.D.   On: 04/29/2017 19:12   Dg Wrist 2 Views Right  Result Date: 05/05/2017 CLINICAL DATA:  ORIF. EXAM: RIGHT WRIST - 2 VIEW COMPARISON:  No recent prior. FINDINGS: Casting right wrist. Plate screw fixation distal radius noted. Hardware intact. Anatomic alignment. IMPRESSION: Plate and screw fixation distal radius.  Anatomic alignment. Electronically Signed   By: [place]   On: 05/05/2017 15:35   Ct Head Wo Contrast  Result Date: 05/20/2017 CLINICAL DATA:  [place] from bed, hit RIGHT side of face on floor. History of dementia. EXAM: CT HEAD WITHOUT CONTRAST CT MAXILLOFACIAL WITHOUT CONTRAST CT CERVICAL SPINE WITHOUT CONTRAST TECHNIQUE: Multidetector CT imaging of the head, cervical spine, and maxillofacial structures were performed using the standard protocol without intravenous contrast. Multiplanar CT image reconstructions of the cervical spine and maxillofacial structures were also generated. COMPARISON:  CT HEAD and cervical spine April 29, 2017 and MRI of the head April 24, 2017 FINDINGS: CT HEAD FINDINGS BRAIN: No intraparenchymal hemorrhage, mass effect nor midline shift. Moderate to severe ventriculomegaly with disproportionate sulcal effacement at the convexities, narrowed callosal angle. Confluent supratentorial and pontine white matter hypodensities. Old basal ganglia, thalamus lacunar infarcts. Old small LEFT cerebellar infarcts. No acute large vascular territory infarct. No abnormal extra-axial fluid collections. VASCULAR: Mild calcific atherosclerosis of the carotid siphons. SKULL: No skull fracture. No significant scalp soft tissue swelling. OTHER: None. CT MAXILLOFACIAL FINDINGS OSSEOUS: The mandible is intact, the condyles are located. No acute facial fracture. No  destructive bony lesions.  ORBITS: Ocular globes and orbital contents are nonacute. Status post bilateral ocular lens implants. SINUSES: Minimal debris and sphenoid sinuses. Trace paranasal sinus mucosal thickening. Status post FESS. Nasal septum is midline. Included mastoid aircells are well aerated. SOFT TISSUES: RIGHT periorbital premalar soft tissue swelling in large RIGHT facial hematoma. No subcutaneous gas or radiopaque foreign bodies. CT CERVICAL SPINE FINDINGS ALIGNMENT: Straightened lordosis. Vertebral bodies in alignment. SKULL BASE AND VERTEBRAE: Cervical vertebral bodies and posterior elements are intact. C3-4 bridging bone mineral density. Severe C5-6 and C6-7 disc height loss with endplate spurring compatible with degenerative discs. C1-2 articulation maintained with advanced atlantodental osteoarthrosis. Calcified apical ligaments. Multilevel moderate to severe LEFT facet arthropathy. No destructive bony lesions. SOFT TISSUES AND SPINAL CANAL: Nonacute. Mild calcific atherosclerosis carotid bifurcations. DISC LEVELS: No significant osseous canal stenosis. Moderate LEFT C2-3, moderate RIGHT C3-4, moderate bilateral C4-5, moderate to severe bilateral C5-6 and C6-7 neural foraminal narrowing. UPPER CHEST: Lung apices are clear. OTHER: None. IMPRESSION: CT HEAD: 1. No acute intracranial process. 2. 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 3. Severe chronic small vessel ischemic disease and scattered small infarct. CT MAXILLOFACIAL: 1. RIGHT periorbital soft tissue swelling/contusion with hematoma. No postseptal extension. 2. No acute facial fracture. CT CERVICAL SPINE: 1. No acute fracture or malalignment. 2. Stable degenerative change of the cervical spine, moderate to severe C5-6 C6-7 neural foraminal narrowing. Electronically Signed   By: Elon Alas M.D.   On: 05/20/2017 00:41   Ct Head Wo Contrast  Result Date: 04/29/2017 CLINICAL DATA:  Unwitnessed fall with  laceration right orbital region. EXAM: CT HEAD WITHOUT CONTRAST CT CERVICAL SPINE WITHOUT CONTRAST TECHNIQUE: Multidetector CT imaging of the head and cervical spine was performed following the standard protocol without intravenous contrast. Multiplanar CT image reconstructions of the cervical spine were also generated. COMPARISON:  04/15/2017 FINDINGS: CT HEAD FINDINGS Brain: Ventricles and cisterns are within normal. There is minimal age related atrophic change. There is mild to moderate chronic ischemic microvascular disease. There is no mass, mass effect, shift of midline structures or acute hemorrhage. Old lacune infarct over the left caudate. No acute infarction. Vascular: No hyperdense vessel or unexpected calcification. Skull: Normal. Negative for fracture or focal lesion. Sinuses/Orbits: Orbits are within normal. Paranasal sinuses are well developed and well aerated with evidence of previous sinus surgery with fenestration of the medial wall of the maxillary sinuses. Mastoid air cells are clear. Other: None. CT CERVICAL SPINE FINDINGS Alignment: Subtle stable degenerative subluxation of C4 on C5. Skull base and vertebrae: Mild spondylosis throughout the cervical spine. Vertebral body heights are maintained. There is moderate uncovertebral joint spurring and facet arthropathy. No acute fracture. Significant neural foraminal narrowing bilaterally at multiple levels due to adjacent bony spurring. Soft tissues and spinal canal: No prevertebral fluid or swelling. No visible canal hematoma. Disc levels: Disc space narrowing at the C3-4, C5-6 and C6-7 levels. Upper chest: Negative. Other: None. IMPRESSION: No acute intracranial findings. Chronic ischemic microvascular disease and mild age related atrophic change. Small old lacunar infarct left caudate. No acute cervical spine injury. Mild spondylosis throughout the cervical spine with multilevel disc disease and multilevel neural foraminal narrowing.  Electronically Signed   By: Marin Olp M.D.   On: 04/29/2017 19:09   Ct Cervical Spine Wo Contrast  Result Date: 05/20/2017 CLINICAL DATA:  [place] from bed, hit RIGHT side of face on floor. History of dementia. EXAM: CT HEAD WITHOUT CONTRAST CT MAXILLOFACIAL WITHOUT CONTRAST CT  CERVICAL SPINE WITHOUT CONTRAST TECHNIQUE: Multidetector CT imaging of the head, cervical spine, and maxillofacial structures were performed using the standard protocol without intravenous contrast. Multiplanar CT image reconstructions of the cervical spine and maxillofacial structures were also generated. COMPARISON:  CT HEAD and cervical spine April 29, 2017 and MRI of the head April 24, 2017 FINDINGS: CT HEAD FINDINGS BRAIN: No intraparenchymal hemorrhage, mass effect nor midline shift. Moderate to severe ventriculomegaly with disproportionate sulcal effacement at the convexities, narrowed callosal angle. Confluent supratentorial and pontine white matter hypodensities. Old basal ganglia, thalamus lacunar infarcts. Old small LEFT cerebellar infarcts. No acute large vascular territory infarct. No abnormal extra-axial fluid collections. VASCULAR: Mild calcific atherosclerosis of the carotid siphons. SKULL: No skull fracture. No significant scalp soft tissue swelling. OTHER: None. CT MAXILLOFACIAL FINDINGS OSSEOUS: The mandible is intact, the condyles are located. No acute facial fracture. No destructive bony lesions. ORBITS: Ocular globes and orbital contents are nonacute. Status post bilateral ocular lens implants. SINUSES: Minimal debris and sphenoid sinuses. Trace paranasal sinus mucosal thickening. Status post FESS. Nasal septum is midline. Included mastoid aircells are well aerated. SOFT TISSUES: RIGHT periorbital premalar soft tissue swelling in large RIGHT facial hematoma. No subcutaneous gas or radiopaque foreign bodies. CT CERVICAL SPINE FINDINGS ALIGNMENT: Straightened lordosis. Vertebral bodies in alignment. SKULL BASE  AND VERTEBRAE: Cervical vertebral bodies and posterior elements are intact. C3-4 bridging bone mineral density. Severe C5-6 and C6-7 disc height loss with endplate spurring compatible with degenerative discs. C1-2 articulation maintained with advanced atlantodental osteoarthrosis. Calcified apical ligaments. Multilevel moderate to severe LEFT facet arthropathy. No destructive bony lesions. SOFT TISSUES AND SPINAL CANAL: Nonacute. Mild calcific atherosclerosis carotid bifurcations. DISC LEVELS: No significant osseous canal stenosis. Moderate LEFT C2-3, moderate RIGHT C3-4, moderate bilateral C4-5, moderate to severe bilateral C5-6 and C6-7 neural foraminal narrowing. UPPER CHEST: Lung apices are clear. OTHER: None. IMPRESSION: CT HEAD: 1. No acute intracranial process. 2. 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 3. Severe chronic small vessel ischemic disease and scattered small infarct. CT MAXILLOFACIAL: 1. RIGHT periorbital soft tissue swelling/contusion with hematoma. No postseptal extension. 2. No acute facial fracture. CT CERVICAL SPINE: 1. No acute fracture or malalignment. 2. Stable degenerative change of the cervical spine, moderate to severe C5-6 C6-7 neural foraminal narrowing. Electronically Signed   By: Elon Alas M.D.   On: 05/20/2017 00:41   Ct Cervical Spine Wo Contrast  Result Date: 04/29/2017 CLINICAL DATA:  Unwitnessed fall with laceration right orbital region. EXAM: CT HEAD WITHOUT CONTRAST CT CERVICAL SPINE WITHOUT CONTRAST TECHNIQUE: Multidetector CT imaging of the head and cervical spine was performed following the standard protocol without intravenous contrast. Multiplanar CT image reconstructions of the cervical spine were also generated. COMPARISON:  04/15/2017 FINDINGS: CT HEAD FINDINGS Brain: Ventricles and cisterns are within normal. There is minimal age related atrophic change. There is mild to moderate chronic ischemic  microvascular disease. There is no mass, mass effect, shift of midline structures or acute hemorrhage. Old lacune infarct over the left caudate. No acute infarction. Vascular: No hyperdense vessel or unexpected calcification. Skull: Normal. Negative for fracture or focal lesion. Sinuses/Orbits: Orbits are within normal. Paranasal sinuses are well developed and well aerated with evidence of previous sinus surgery with fenestration of the medial wall of the maxillary sinuses. Mastoid air cells are clear. Other: None. CT CERVICAL SPINE FINDINGS Alignment: Subtle stable degenerative subluxation of C4 on C5. Skull base and vertebrae: Mild spondylosis throughout the cervical spine. Vertebral body heights  are maintained. There is moderate uncovertebral joint spurring and facet arthropathy. No acute fracture. Significant neural foraminal narrowing bilaterally at multiple levels due to adjacent bony spurring. Soft tissues and spinal canal: No prevertebral fluid or swelling. No visible canal hematoma. Disc levels: Disc space narrowing at the C3-4, C5-6 and C6-7 levels. Upper chest: Negative. Other: None. IMPRESSION: No acute intracranial findings. Chronic ischemic microvascular disease and mild age related atrophic change. Small old lacunar infarct left caudate. No acute cervical spine injury. Mild spondylosis throughout the cervical spine with multilevel disc disease and multilevel neural foraminal narrowing. Electronically Signed   By: Marin Olp M.D.   On: 04/29/2017 19:09   Ct Abdomen Pelvis W Contrast  Result Date: 04/26/2017 CLINICAL DATA:  Generalized abdominal pain since yesterday. Diffuse abdominal tenderness. EXAM: CT ABDOMEN AND PELVIS WITH CONTRAST TECHNIQUE: Multidetector CT imaging of the abdomen and pelvis was performed using the standard protocol following bolus administration of intravenous contrast. CONTRAST:  131mL ISOVUE-300 IOPAMIDOL (ISOVUE-300) INJECTION 61% COMPARISON:  Pelvic ultrasound dated  04/17/2017. Abdomen and pelvis CT dated 06/08/2015. FINDINGS: Lower chest: Minimal bilateral dependent atelectasis. Hepatobiliary: 1.4 cm right lobe liver cyst and 0.6 cm left lobe liver cysts. Mild diffuse low density of the liver relative to the spleen. Normal appearing gallbladder. Pancreas: Diffusely atrophied with some progression. Spleen: Normal in size without focal abnormality. Adrenals/Urinary Tract: Normal appearing adrenal glands. Small bilateral renal cysts. Interval moderate dilatation of both renal collecting systems. There is also moderate dilatation of the right ureter to the level of the superior aspect of a dilated urinary bladder which appears be compressing the ureter. There is mild dilatation of the left ureter to the level of dilated sigmoid colon containing stool and fluid, displaced by the dilated urinary bladder. No urinary tract calculi are seen. Stomach/Bowel: The rectum and sigmoid colon are dilated and filled with fluid and stool. The more proximal portions of the colon far last dilated, containing stool, gas and some fluid. There is a small bowel to right colon anastomosis. No gastric or small bowel dilatation is seen. There is a small to moderate-sized hiatal hernia. Surgically absent appendix. Vascular/Lymphatic: Atheromatous arterial calcifications without aneurysm. No enlarged lymph nodes. Reproductive: Status post hysterectomy. No adnexal masses. Other: Minimal free peritoneal fluid. Musculoskeletal: Mild lumbar and lower thoracic spine degenerative changes. IMPRESSION: 1. Dilated urinary bladder. 2. Rectal fecal impaction with associated dilatation of the sigmoid colon, containing fluid and stool. There is less dilatation of the remainder the colon. 3. Interval moderate bilateral hydronephrosis due to compression of the right ureter by the dilated urinary bladder and the left ureter by the dilated sigmoid colon. 4. Minimal free peritoneal fluid. 5. Mild diffuse hepatic steatosis.  6. Small to moderate-sized hiatal hernia. Electronically Signed   By: Claudie Revering M.D.   On: 04/26/2017 13:45   Dg Chest Port 1 View  Result Date: 05/20/2017 CLINICAL DATA:  83 year old female with fall. EXAM: PORTABLE CHEST 1 VIEW COMPARISON:  Chest radiograph dated 04/15/2017 FINDINGS: There is a linear lucency in the periphery of the right lung which is favored to represent a skin fold. A small pneumothorax is less likely but not entirely excluded. Recommend repeat radiograph with repositioning of the patient. The lungs are clear. There is no pleural effusion. The cardiac silhouette is within normal limits. There is atherosclerotic calcification of the thoracic aorta. The bones are osteopenic. No acute osseous pathology. IMPRESSION: Artifact versus less likely a small right pneumothorax. Repeat radiograph with better positioning of the patient  recommended. No other acute cardiopulmonary process. Electronically Signed   By: Anner Crete M.D.   On: 05/20/2017 00:12   Ct Maxillofacial Wo Contrast  Result Date: 05/20/2017 CLINICAL DATA:  [place] from bed, hit RIGHT side of face on floor. History of dementia. EXAM: CT HEAD WITHOUT CONTRAST CT MAXILLOFACIAL WITHOUT CONTRAST CT CERVICAL SPINE WITHOUT CONTRAST TECHNIQUE: Multidetector CT imaging of the head, cervical spine, and maxillofacial structures were performed using the standard protocol without intravenous contrast. Multiplanar CT image reconstructions of the cervical spine and maxillofacial structures were also generated. COMPARISON:  CT HEAD and cervical spine April 29, 2017 and MRI of the head April 24, 2017 FINDINGS: CT HEAD FINDINGS BRAIN: No intraparenchymal hemorrhage, mass effect nor midline shift. Moderate to severe ventriculomegaly with disproportionate sulcal effacement at the convexities, narrowed callosal angle. Confluent supratentorial and pontine white matter hypodensities. Old basal ganglia, thalamus lacunar infarcts. Old small  LEFT cerebellar infarcts. No acute large vascular territory infarct. No abnormal extra-axial fluid collections. VASCULAR: Mild calcific atherosclerosis of the carotid siphons. SKULL: No skull fracture. No significant scalp soft tissue swelling. OTHER: None. CT MAXILLOFACIAL FINDINGS OSSEOUS: The mandible is intact, the condyles are located. No acute facial fracture. No destructive bony lesions. ORBITS: Ocular globes and orbital contents are nonacute. Status post bilateral ocular lens implants. SINUSES: Minimal debris and sphenoid sinuses. Trace paranasal sinus mucosal thickening. Status post FESS. Nasal septum is midline. Included mastoid aircells are well aerated. SOFT TISSUES: RIGHT periorbital premalar soft tissue swelling in large RIGHT facial hematoma. No subcutaneous gas or radiopaque foreign bodies. CT CERVICAL SPINE FINDINGS ALIGNMENT: Straightened lordosis. Vertebral bodies in alignment. SKULL BASE AND VERTEBRAE: Cervical vertebral bodies and posterior elements are intact. C3-4 bridging bone mineral density. Severe C5-6 and C6-7 disc height loss with endplate spurring compatible with degenerative discs. C1-2 articulation maintained with advanced atlantodental osteoarthrosis. Calcified apical ligaments. Multilevel moderate to severe LEFT facet arthropathy. No destructive bony lesions. SOFT TISSUES AND SPINAL CANAL: Nonacute. Mild calcific atherosclerosis carotid bifurcations. DISC LEVELS: No significant osseous canal stenosis. Moderate LEFT C2-3, moderate RIGHT C3-4, moderate bilateral C4-5, moderate to severe bilateral C5-6 and C6-7 neural foraminal narrowing. UPPER CHEST: Lung apices are clear. OTHER: None. IMPRESSION: CT HEAD: 1. No acute intracranial process. 2. 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 3. Severe chronic small vessel ischemic disease and scattered small infarct. CT MAXILLOFACIAL: 1. RIGHT periorbital soft tissue swelling/contusion with  hematoma. No postseptal extension. 2. No acute facial fracture. CT CERVICAL SPINE: 1. No acute fracture or malalignment. 2. Stable degenerative change of the cervical spine, moderate to severe C5-6 C6-7 neural foraminal narrowing. Electronically Signed   By: Elon Alas M.D.   On: 05/20/2017 00:41       Today   Subjective:   [place]   patient denies any symptoms  Blood pressure (!) 103/49, pulse 70, temperature 98.1 F (36.7 C), temperature source Oral, resp. rate 16, height 5\' 4"  (1.626 m), weight 128 lb 11.2 oz (58.4 kg), SpO2 96 %.  .  Intake/Output Summary (Last 24 hours) at 05/21/17 1255 Last data filed at 05/21/17 1021  Gross per 24 hour  Intake          2436.67 ml  Output              600 ml  Net          1836.67 ml    Exam VITAL SIGNS: Blood pressure (!) 103/49, pulse 70, temperature 98.1 F (36.7 C),  temperature source Oral, resp. rate 16, height 5\' 4"  (1.626 m), weight 128 lb 11.2 oz (58.4 kg), SpO2 96 %.  GENERAL:  83 y.o.-year-old patient lying in the bed with no acute distress.  EYES: Pupils equal, round, reactive to light and accommodation. No scleral icterus. Extraocular muscles intact.  HEENT: Head atraumatic, normocephalic. Oropharynx and nasopharynx clear.  NECK:  Supple, no jugular venous distention. No thyroid enlargement, no tenderness.  LUNGS: Normal breath sounds bilaterally, no wheezing, rales,rhonchi or crepitation. No use of accessory muscles of respiration.  CARDIOVASCULAR: S1, S2 normal. No murmurs, rubs, or gallops.  ABDOMEN: Soft, nontender, nondistended. Bowel sounds present. No organomegaly or mass.  EXTREMITIES: No pedal edema, cyanosis, or clubbing.  NEUROLOGIC: Cranial nerves II through XII are intact. Muscle strength 5/5 in all extremities. Sensation intact. Gait not checked.  PSYCHIATRIC: The patient is alert and oriented x 3.  SKIN: positive bruising on the face on the right   Data Review     CBC w Diff:  Lab Results   Component Value Date   WBC 4.4 05/21/2017   HGB 7.8 (L) 05/21/2017   HGB 11.7 (L) 12/12/2013   HCT 23.4 (L) 05/21/2017   HCT 34.3 (L) 12/12/2013   PLT 289 05/21/2017   PLT 243 12/12/2013   LYMPHOPCT 14 04/26/2017   LYMPHOPCT 30.0 12/12/2013   MONOPCT 7 04/26/2017   MONOPCT 7.2 12/12/2013   EOSPCT 2 04/26/2017   EOSPCT 3.5 12/12/2013   BASOPCT 1 04/26/2017   BASOPCT 0.9 12/12/2013   CMP:  Lab Results  Component Value Date   NA 137 05/21/2017   NA 133 (L) 12/12/2013   K 3.4 (L) 05/21/2017   K 4.0 12/12/2013   CL 109 05/21/2017   CL 98 12/12/2013   CO2 23 05/21/2017   CO2 30 12/12/2013   BUN 11 05/21/2017   BUN 18 12/12/2013   CREATININE 0.90 05/21/2017   CREATININE 1.05 12/12/2013   PROT 6.6 04/26/2017   PROT 7.4 12/12/2013   ALBUMIN 3.6 04/26/2017   ALBUMIN 3.3 (L) 12/12/2013   BILITOT 0.8 04/26/2017   BILITOT 0.5 12/12/2013   ALKPHOS 125 04/26/2017   ALKPHOS 110 12/12/2013   AST 30 04/26/2017   AST 17 12/12/2013   ALT 23 04/26/2017   ALT 26 12/12/2013  .  Micro Results Recent Results (from the past 240 hour(s))  Gastrointestinal Panel by PCR , Stool     Status: None   Collection Time: 05/19/17 11:47 PM  Result Value Ref Range Status   Campylobacter species NOT DETECTED NOT DETECTED Final   Plesimonas shigelloides NOT DETECTED NOT DETECTED Final   Salmonella species NOT DETECTED NOT DETECTED Final   Yersinia enterocolitica NOT DETECTED NOT DETECTED Final   Vibrio species NOT DETECTED NOT DETECTED Final   Vibrio cholerae NOT DETECTED NOT DETECTED Final   Enteroaggregative E coli (EAEC) NOT DETECTED NOT DETECTED Final   Enteropathogenic E coli (EPEC) NOT DETECTED NOT DETECTED Final   Enterotoxigenic E coli (ETEC) NOT DETECTED NOT DETECTED Final   Shiga like toxin producing E coli (STEC) NOT DETECTED NOT DETECTED Final   Shigella/Enteroinvasive E coli (EIEC) NOT DETECTED NOT DETECTED Final   Cryptosporidium NOT DETECTED NOT DETECTED Final   Cyclospora  cayetanensis NOT DETECTED NOT DETECTED Final   Entamoeba histolytica NOT DETECTED NOT DETECTED Final   Giardia lamblia NOT DETECTED NOT DETECTED Final   Adenovirus F40/41 NOT DETECTED NOT DETECTED Final   Astrovirus NOT DETECTED NOT DETECTED Final   Norovirus GI/GII NOT  DETECTED NOT DETECTED Final   Rotavirus A NOT DETECTED NOT DETECTED Final   Sapovirus (I, II, IV, and V) NOT DETECTED NOT DETECTED Final  C difficile quick scan w PCR reflex     Status: Abnormal   Collection Time: 05/19/17 11:47 PM  Result Value Ref Range Status   C Diff antigen POSITIVE (A) NEGATIVE Final   C Diff toxin NEGATIVE NEGATIVE Final   C Diff interpretation Results are indeterminate. See PCR results.  Final  Clostridium Difficile by PCR     Status: None   Collection Time: 05/19/17 11:47 PM  Result Value Ref Range Status   Toxigenic C Difficile by pcr NEGATIVE NEGATIVE Final    Comment: Patient is colonized with non toxigenic C. difficile. May not need treatment unless significant symptoms are present.  MRSA PCR Screening     Status: None   Collection Time: 05/20/17  5:36 AM  Result Value Ref Range Status   MRSA by PCR NEGATIVE NEGATIVE Final    Comment:        The GeneXpert MRSA Assay (FDA approved for NASAL specimens only), is one component of a comprehensive MRSA colonization surveillance program. It is not intended to diagnose MRSA infection nor to guide or monitor treatment for MRSA infections.         Code Status Orders        Start     Ordered   05/20/17 0528  Full code  Continuous     05/20/17 0527    Code Status History    Date Active Date Inactive Code Status Order ID Comments User Context   05/05/2017  2:36 PM 05/05/2017  6:40 PM Full Code [id number]  Hessie Knows, MD Inpatient   04/15/2017  9:02 PM 04/21/2017  7:16 PM Full Code [id number]  Henreitta Leber, MD Inpatient   04/05/2017  4:08 PM 04/07/2017  9:19 PM Full Code [id number]  [name], MD Inpatient   02/12/2017 10:42 PM  02/16/2017  6:46 PM Full Code [id number]  Lance Coon, MD Inpatient   01/13/2017  8:22 PM 01/16/2017  7:10 PM Full Code [id number]  [place], Ubaldo Glassing, DO Inpatient   11/06/2016 11:26 AM 11/15/2016  2:06 AM DNR [id number]  Flora Lipps, MD Inpatient   11/06/2016  3:25 AM 11/06/2016 11:26 AM Full Code [id number]  Saundra Shelling, MD Inpatient   07/15/2016  6:36 PM 07/17/2016 10:11 PM Full Code [id number]  Fritzi Mandes, MD Inpatient   06/08/2015  9:01 PM 06/11/2015  9:20 PM Full Code [id number]  Henreitta Leber, MD Inpatient   06/08/2015  5:16 PM 06/08/2015  9:01 PM Full Code [id number]  Henreitta Leber, MD ED            Discharge Medications   Allergies as of 05/21/2017      Reactions   Penicillins Rash   Has patient had a PCN reaction causing immediate rash, facial/tongue/throat swelling, SOB or lightheadedness with hypotension: No Has patient had a PCN reaction causing severe rash involving mucus membranes or skin necrosis: No Has patient had a PCN reaction that required hospitalization No Has patient had a PCN reaction occurring within the last 10 years: No If all of the above answers are "NO", then may proceed with Cephalosporin use.      Medication List    STOP taking these medications   polyethylene glycol packet Commonly known as:  MIRALAX / GLYCOLAX   sennosides-docusate sodium 8.6-50 MG tablet Commonly known as:  SENOKOT-S     TAKE  these medications   acetaminophen 500 MG tablet Commonly known as:  TYLENOL Take 1,000 mg by mouth every 8 (eight) hours.   aspirin 81 MG chewable tablet Chew 81 mg by mouth daily.   atorvastatin 80 MG tablet Commonly known as:  LIPITOR Take 1 tablet (80 mg total) by mouth daily.   clopidogrel 75 MG tablet Commonly known as:  PLAVIX TAKE 1 TABLET(75 MG) BY MOUTH DAILY   fluticasone 50 MCG/ACT nasal spray Commonly known as:  FLONASE Place 2 sprays into both nostrils daily.   insulin aspart 100 UNIT/ML injection Commonly known as:   novoLOG Inject 3 Units into the skin 3 (three) times daily with meals. What changed:  how much to take  additional instructions   insulin glargine 100 UNIT/ML injection Commonly known as:  LANTUS Inject 0.08 mLs (8 Units total) into the skin daily. What changed:  how much to take   levothyroxine 100 MCG tablet Commonly known as:  SYNTHROID, LEVOTHROID TAKE 1 TABLET(100 MCG) BY MOUTH DAILY   metoCLOPramide 5 MG tablet Commonly known as:  REGLAN Take 5 mg by mouth 4 (four) times daily -  before meals and at bedtime.   metroNIDAZOLE 500 MG tablet Commonly known as:  FLAGYL Take 1 tablet (500 mg total) by mouth every 8 (eight) hours.   midodrine 10 MG tablet Commonly known as:  PROAMATINE Take 1 tablet (10 mg total) by mouth 3 (three) times daily with meals.   oxyCODONE 5 MG immediate release tablet Commonly known as:  ROXICODONE Take 0.5 tablets (2.5 mg total) by mouth every 4 (four) hours as needed. What changed:  when to take this  reasons to take this   sertraline 25 MG tablet Commonly known as:  ZOLOFT Take 1 tablet (25 mg total) by mouth daily after breakfast.            Durable Medical Equipment        Start     Ordered   05/20/17 1714  For home use only DME Hospital bed  Once    Question Answer Comment  Patient has (list medical condition): recurrent falls, gait instablity   The above medical condition requires: Patient requires the ability to reposition frequently   Bed type Semi-electric      05/20/17 1713   05/20/17 1713  For home use only DME standard manual wheelchair with seat cushion  Once    Comments:  Patient suffers from  [place] and gait instablity which impairs their ability to perform daily activities like walking , bathing  in the home.  A walker or cane will not resolve  issue with performing activities of daily living. A wheelchair will allow patient to safely perform daily activities. Patient can safely propel the wheelchair  in the home or has a caregiver who can provide assistance.  Accessories: elevating leg rests , wheel locks, extensions and anti-tippers.   05/20/17 1713         Total Time in preparing paper work, data evaluation and todays exam - 35 minutes  Dustin Flock M.D on 05/21/2017 at 12:55 PM  [facility]   Office  [phone]

## 2017-05-21 NOTE — Discharge Instructions (Signed)
[facility] at [place]:  Diabetic diet  DISCHARGE CONDITION:  Stable  ACTIVITY:  Activity as tolerated  OXYGEN:  Home Oxygen: No.   Oxygen Delivery: room air  DISCHARGE LOCATION:  [facility]    ADDITIONAL DISCHARGE INSTRUCTION:   If you experience worsening of your admission symptoms, develop shortness of breath, life threatening emergency, suicidal or homicidal thoughts you must seek medical attention immediately by calling 911 or calling your MD immediately  if symptoms less severe.  You Must read complete instructions/literature along with all the possible adverse reactions/side effects for all the Medicines you take and that have been prescribed to you. Take any new Medicines after you have completely understood and accpet all the possible adverse reactions/side effects.   Please note  You were cared for by a hospitalist during your hospital stay. If you have any questions about your discharge medications or the care you received while you were in the hospital after you are discharged, you can call the unit and asked to speak with the hospitalist on call if the hospitalist that took care of you is not available. Once you are discharged, your primary care physician will handle any further medical issues. Please note that NO REFILLS for any discharge medications will be authorized once you are discharged, as it is imperative that you return to your primary care physician (or establish a relationship with a primary care physician if you do not have one) for your aftercare needs so that they can reassess your need for medications and monitor your lab values.

## 2017-05-21 NOTE — Progress Notes (Signed)
Pt discharged via non-emergent [facility] to [facility]. Report called to accepting RN. IV removed, pt on room air and in no distress. Ammie Dalton, RN

## 2017-05-21 NOTE — Progress Notes (Signed)
RN notified this morning of BG of 66. Pt assessed and 4oz of orange juice given to patient. Pt asymptomatic. BG recheck is 109. Will continue to monitor. Ammie Dalton, RN

## 2017-05-22 ENCOUNTER — Telehealth: Payer: Self-pay | Admitting: Family Medicine

## 2017-05-22 DIAGNOSIS — Z853 Personal history of malignant neoplasm of breast: Secondary | ICD-10-CM | POA: Diagnosis not present

## 2017-05-22 DIAGNOSIS — Z794 Long term (current) use of insulin: Secondary | ICD-10-CM | POA: Diagnosis not present

## 2017-05-22 DIAGNOSIS — M810 Age-related osteoporosis without current pathological fracture: Secondary | ICD-10-CM | POA: Diagnosis not present

## 2017-05-22 DIAGNOSIS — I10 Essential (primary) hypertension: Secondary | ICD-10-CM | POA: Diagnosis not present

## 2017-05-22 DIAGNOSIS — J449 Chronic obstructive pulmonary disease, unspecified: Secondary | ICD-10-CM | POA: Diagnosis not present

## 2017-05-22 DIAGNOSIS — I2510 Atherosclerotic heart disease of native coronary artery without angina pectoris: Secondary | ICD-10-CM | POA: Diagnosis not present

## 2017-05-22 DIAGNOSIS — S52201D Unspecified fracture of shaft of right ulna, subsequent encounter for closed fracture with routine healing: Secondary | ICD-10-CM | POA: Diagnosis not present

## 2017-05-22 DIAGNOSIS — G4730 Sleep apnea, unspecified: Secondary | ICD-10-CM | POA: Diagnosis not present

## 2017-05-22 DIAGNOSIS — Z9181 History of falling: Secondary | ICD-10-CM | POA: Diagnosis not present

## 2017-05-22 DIAGNOSIS — E1165 Type 2 diabetes mellitus with hyperglycemia: Secondary | ICD-10-CM | POA: Diagnosis not present

## 2017-05-22 NOTE — Telephone Encounter (Signed)
Laqueta Linden from [employer] called and stated that she took pt's blood sugar at 8:17 am this morning and it was 464, she gave the pt the sliding scale 10 units and checked the sugar again an hour and fifteen minutes later and it was 459. Please advise, thank you!  Call @ [phone]

## 2017-05-22 NOTE — Telephone Encounter (Signed)
Noted. I think that would probably be best given her difficulty with her sugars recently. Glad to hear her blood sugars back into the 200s.

## 2017-05-22 NOTE — Telephone Encounter (Signed)
Please try to contact them again and see how much Lantus she is taking. Please also see what her blood sugar is at this time. Please see if she has any symptoms such as confusion, nausea, vomiting, or any new symptoms. We need to try to get her in to see Chrys Racer for her diabetes and she needs to be seen for hospital follow-up as well with myself or Joycelyn Schmid.

## 2017-05-22 NOTE — Telephone Encounter (Signed)
Spoke with [facility]. She says that patient is going to start being seen by MD in facility. Patients blood sugar is back down to the 200s.

## 2017-05-22 NOTE — Telephone Encounter (Signed)
Patient was discharged from the hospital yesterday and taken back to [facility]. She tested positive for C-diff in the hospital. Her blood sugar was 464 this morning at 8:15 am. Patient was given 10 units sliding scale and blood sugar was checked 1 hr and 15 mins later, blood sugar was 459. This is a patient of Dr. Geralynn Ochs who has seen Joycelyn Schmid in the past. She has not established care with anyone else. I have tried to call [facility] twice and phone keeps giving busy signal. Please advise.

## 2017-05-26 DIAGNOSIS — Z853 Personal history of malignant neoplasm of breast: Secondary | ICD-10-CM | POA: Diagnosis not present

## 2017-05-26 DIAGNOSIS — E119 Type 2 diabetes mellitus without complications: Secondary | ICD-10-CM | POA: Diagnosis not present

## 2017-05-26 DIAGNOSIS — G4730 Sleep apnea, unspecified: Secondary | ICD-10-CM | POA: Diagnosis not present

## 2017-05-26 DIAGNOSIS — D518 Other vitamin B12 deficiency anemias: Secondary | ICD-10-CM | POA: Diagnosis not present

## 2017-05-26 DIAGNOSIS — I10 Essential (primary) hypertension: Secondary | ICD-10-CM | POA: Diagnosis not present

## 2017-05-26 DIAGNOSIS — M810 Age-related osteoporosis without current pathological fracture: Secondary | ICD-10-CM | POA: Diagnosis not present

## 2017-05-26 DIAGNOSIS — E038 Other specified hypothyroidism: Secondary | ICD-10-CM | POA: Diagnosis not present

## 2017-05-26 DIAGNOSIS — S52201D Unspecified fracture of shaft of right ulna, subsequent encounter for closed fracture with routine healing: Secondary | ICD-10-CM | POA: Diagnosis not present

## 2017-05-26 DIAGNOSIS — Z794 Long term (current) use of insulin: Secondary | ICD-10-CM | POA: Diagnosis not present

## 2017-05-26 DIAGNOSIS — J449 Chronic obstructive pulmonary disease, unspecified: Secondary | ICD-10-CM | POA: Diagnosis not present

## 2017-05-26 DIAGNOSIS — E1165 Type 2 diabetes mellitus with hyperglycemia: Secondary | ICD-10-CM | POA: Diagnosis not present

## 2017-05-26 DIAGNOSIS — E7849 Other hyperlipidemia: Secondary | ICD-10-CM | POA: Diagnosis not present

## 2017-05-26 DIAGNOSIS — I2510 Atherosclerotic heart disease of native coronary artery without angina pectoris: Secondary | ICD-10-CM | POA: Diagnosis not present

## 2017-05-26 DIAGNOSIS — E559 Vitamin D deficiency, unspecified: Secondary | ICD-10-CM | POA: Diagnosis not present

## 2017-05-26 DIAGNOSIS — Z9181 History of falling: Secondary | ICD-10-CM | POA: Diagnosis not present

## 2017-05-26 DIAGNOSIS — Z79899 Other long term (current) drug therapy: Secondary | ICD-10-CM | POA: Diagnosis not present

## 2017-05-27 ENCOUNTER — Inpatient Hospital Stay
Admission: EM | Admit: 2017-05-27 | Discharge: 2017-05-29 | DRG: 871 | Disposition: A | Discharge status: Home Health | Payer: Medicare Other | Attending: Internal Medicine | Admitting: Internal Medicine

## 2017-05-27 ENCOUNTER — Encounter: Payer: Self-pay | Admitting: Emergency Medicine

## 2017-05-27 DIAGNOSIS — S52571A Other intraarticular fracture of lower end of right radius, initial encounter for closed fracture: Secondary | ICD-10-CM | POA: Diagnosis not present

## 2017-05-27 DIAGNOSIS — E875 Hyperkalemia: Secondary | ICD-10-CM | POA: Diagnosis present

## 2017-05-27 DIAGNOSIS — S52201D Unspecified fracture of shaft of right ulna, subsequent encounter for closed fracture with routine healing: Secondary | ICD-10-CM | POA: Diagnosis not present

## 2017-05-27 DIAGNOSIS — E1110 Type 2 diabetes mellitus with ketoacidosis without coma: Secondary | ICD-10-CM | POA: Diagnosis present

## 2017-05-27 DIAGNOSIS — E785 Hyperlipidemia, unspecified: Secondary | ICD-10-CM | POA: Diagnosis not present

## 2017-05-27 DIAGNOSIS — Z7982 Long term (current) use of aspirin: Secondary | ICD-10-CM

## 2017-05-27 DIAGNOSIS — M810 Age-related osteoporosis without current pathological fracture: Secondary | ICD-10-CM | POA: Diagnosis present

## 2017-05-27 DIAGNOSIS — N3000 Acute cystitis without hematuria: Secondary | ICD-10-CM | POA: Diagnosis present

## 2017-05-27 DIAGNOSIS — I252 Old myocardial infarction: Secondary | ICD-10-CM | POA: Diagnosis not present

## 2017-05-27 DIAGNOSIS — N170 Acute kidney failure with tubular necrosis: Secondary | ICD-10-CM | POA: Diagnosis present

## 2017-05-27 DIAGNOSIS — Z8542 Personal history of malignant neoplasm of other parts of uterus: Secondary | ICD-10-CM

## 2017-05-27 DIAGNOSIS — Z880 Allergy status to penicillin: Secondary | ICD-10-CM | POA: Diagnosis not present

## 2017-05-27 DIAGNOSIS — F0390 Unspecified dementia without behavioral disturbance: Secondary | ICD-10-CM | POA: Diagnosis present

## 2017-05-27 DIAGNOSIS — R296 Repeated falls: Secondary | ICD-10-CM | POA: Diagnosis present

## 2017-05-27 DIAGNOSIS — Z9181 History of falling: Secondary | ICD-10-CM | POA: Diagnosis not present

## 2017-05-27 DIAGNOSIS — J449 Chronic obstructive pulmonary disease, unspecified: Secondary | ICD-10-CM | POA: Diagnosis present

## 2017-05-27 DIAGNOSIS — G9341 Metabolic encephalopathy: Secondary | ICD-10-CM | POA: Diagnosis present

## 2017-05-27 DIAGNOSIS — N179 Acute kidney failure, unspecified: Secondary | ICD-10-CM | POA: Diagnosis not present

## 2017-05-27 DIAGNOSIS — I2510 Atherosclerotic heart disease of native coronary artery without angina pectoris: Secondary | ICD-10-CM | POA: Diagnosis present

## 2017-05-27 DIAGNOSIS — E872 Acidosis, unspecified: Secondary | ICD-10-CM

## 2017-05-27 DIAGNOSIS — F0150 Vascular dementia without behavioral disturbance: Secondary | ICD-10-CM

## 2017-05-27 DIAGNOSIS — Z66 Do not resuscitate: Secondary | ICD-10-CM | POA: Diagnosis not present

## 2017-05-27 DIAGNOSIS — Z79899 Other long term (current) drug therapy: Secondary | ICD-10-CM | POA: Diagnosis not present

## 2017-05-27 DIAGNOSIS — R4182 Altered mental status, unspecified: Secondary | ICD-10-CM | POA: Diagnosis present

## 2017-05-27 DIAGNOSIS — F411 Generalized anxiety disorder: Secondary | ICD-10-CM | POA: Diagnosis present

## 2017-05-27 DIAGNOSIS — Z794 Long term (current) use of insulin: Secondary | ICD-10-CM | POA: Diagnosis not present

## 2017-05-27 DIAGNOSIS — E039 Hypothyroidism, unspecified: Secondary | ICD-10-CM | POA: Diagnosis not present

## 2017-05-27 DIAGNOSIS — E0811 Diabetes mellitus due to underlying condition with ketoacidosis with coma: Secondary | ICD-10-CM

## 2017-05-27 DIAGNOSIS — G4733 Obstructive sleep apnea (adult) (pediatric): Secondary | ICD-10-CM | POA: Diagnosis present

## 2017-05-27 DIAGNOSIS — Z853 Personal history of malignant neoplasm of breast: Secondary | ICD-10-CM | POA: Diagnosis not present

## 2017-05-27 DIAGNOSIS — A419 Sepsis, unspecified organism: Principal | ICD-10-CM | POA: Diagnosis present

## 2017-05-27 DIAGNOSIS — Z8673 Personal history of transient ischemic attack (TIA), and cerebral infarction without residual deficits: Secondary | ICD-10-CM | POA: Diagnosis not present

## 2017-05-27 DIAGNOSIS — Z7902 Long term (current) use of antithrombotics/antiplatelets: Secondary | ICD-10-CM

## 2017-05-27 DIAGNOSIS — E1165 Type 2 diabetes mellitus with hyperglycemia: Secondary | ICD-10-CM | POA: Diagnosis not present

## 2017-05-27 DIAGNOSIS — E1310 Other specified diabetes mellitus with ketoacidosis without coma: Secondary | ICD-10-CM | POA: Diagnosis not present

## 2017-05-27 DIAGNOSIS — K219 Gastro-esophageal reflux disease without esophagitis: Secondary | ICD-10-CM | POA: Diagnosis present

## 2017-05-27 DIAGNOSIS — N390 Urinary tract infection, site not specified: Secondary | ICD-10-CM | POA: Diagnosis not present

## 2017-05-27 DIAGNOSIS — E871 Hypo-osmolality and hyponatremia: Secondary | ICD-10-CM | POA: Diagnosis not present

## 2017-05-27 DIAGNOSIS — G4730 Sleep apnea, unspecified: Secondary | ICD-10-CM | POA: Diagnosis not present

## 2017-05-27 DIAGNOSIS — R4020 Unspecified coma: Secondary | ICD-10-CM | POA: Diagnosis not present

## 2017-05-27 DIAGNOSIS — E1111 Type 2 diabetes mellitus with ketoacidosis with coma: Secondary | ICD-10-CM | POA: Diagnosis not present

## 2017-05-27 DIAGNOSIS — I10 Essential (primary) hypertension: Secondary | ICD-10-CM | POA: Diagnosis not present

## 2017-05-27 DIAGNOSIS — R739 Hyperglycemia, unspecified: Secondary | ICD-10-CM | POA: Diagnosis not present

## 2017-05-27 LAB — URINALYSIS, COMPLETE (UACMP) WITH MICROSCOPIC
BILIRUBIN URINE: NEGATIVE
Ketones, ur: 20 mg/dL — AB
NITRITE: NEGATIVE
PH: 5 (ref 5.0–8.0)
Protein, ur: NEGATIVE mg/dL
Specific Gravity, Urine: 1.017 (ref 1.005–1.030)

## 2017-05-27 LAB — BASIC METABOLIC PANEL
BUN: 23 mg/dL — ABNORMAL HIGH (ref 6–20)
CALCIUM: 9.5 mg/dL (ref 8.9–10.3)
CO2: <7 mmol/L — ABNORMAL LOW (ref 22–32)
Chloride: 92 mmol/L — ABNORMAL LOW (ref 101–111)
Creatinine, Ser: 2.26 mg/dL — ABNORMAL HIGH (ref 0.44–1.00)
GFR, EST AFRICAN AMERICAN: 22 mL/min — AB (ref >60)
GFR, EST NON AFRICAN AMERICAN: 19 mL/min — AB (ref >60)
Glucose, Bld: 1022 mg/dL (ref 65–99)
POTASSIUM: 5.9 mmol/L — AB (ref 3.5–5.1)
Sodium: 129 mmol/L — ABNORMAL LOW (ref 135–145)

## 2017-05-27 LAB — LACTIC ACID, PLASMA: LACTIC ACID, VENOUS: 7.4 mmol/L — AB (ref 0.5–1.9)

## 2017-05-27 LAB — CBC WITH DIFFERENTIAL/PLATELET
BASOS ABS: 0.1 10*3/uL (ref 0–0.1)
Basophils Relative: 1 %
EOS ABS: 0 10*3/uL (ref 0–0.7)
EOS PCT: 0 %
HCT: 31.9 % — ABNORMAL LOW (ref 35.0–47.0)
HEMOGLOBIN: 9.2 g/dL — AB (ref 12.0–16.0)
LYMPHS ABS: 0.6 10*3/uL — AB (ref 1.0–3.6)
LYMPHS PCT: 5 %
MCH: 33.2 pg (ref 26.0–34.0)
MCHC: 28.8 g/dL — ABNORMAL LOW (ref 32.0–36.0)
MCV: 115.3 fL — AB (ref 80.0–100.0)
Monocytes Absolute: 0.7 10*3/uL (ref 0.2–0.9)
Monocytes Relative: 6 %
NEUTROS PCT: 88 %
Neutro Abs: 11.8 10*3/uL — ABNORMAL HIGH (ref 1.4–6.5)
PLATELETS: 373 10*3/uL (ref 150–440)
RBC: 2.76 MIL/uL — AB (ref 3.80–5.20)
RDW: 20.4 % — ABNORMAL HIGH (ref 11.5–14.5)
WBC: 13.2 10*3/uL — AB (ref 3.6–11.0)

## 2017-05-27 LAB — GLUCOSE, CAPILLARY
GLUCOSE-CAPILLARY: 352 mg/dL — AB (ref 65–99)
Glucose-Capillary: >600 mg/dL (ref 65–99)
Glucose-Capillary: >600 mg/dL (ref 65–99)
Glucose-Capillary: >600 mg/dL (ref 65–99)
Glucose-Capillary: 471 mg/dL — ABNORMAL HIGH (ref 65–99)

## 2017-05-27 LAB — OSMOLALITY: Osmolality: 348 mOsm/kg (ref 275–295)

## 2017-05-27 LAB — PROTIME-INR
INR: 1.23
PROTHROMBIN TIME: 15.4 s — AB (ref 11.4–15.2)

## 2017-05-27 LAB — BLOOD GAS, VENOUS
ACID-BASE DEFICIT: 27.5 mmol/L — AB (ref 0.0–2.0)
BICARBONATE: 4.4 mmol/L — AB (ref 20.0–28.0)
O2 SAT: 39.4 %
PATIENT TEMPERATURE: 37
PCO2 VEN: 22 mmHg — AB (ref 44.0–60.0)
PO2 VEN: 41 mmHg (ref 32.0–45.0)
Patient temperature: 37
pCO2, Ven: 19 mmHg — CL (ref 44.0–60.0)
pH, Ven: 6.99 — CL (ref 7.250–7.430)
pH, Ven: 6.91 — CL (ref 7.250–7.430)
pO2, Ven: 53 mmHg — ABNORMAL HIGH (ref 32.0–45.0)

## 2017-05-27 LAB — APTT: aPTT: 25 seconds (ref 24–36)

## 2017-05-27 LAB — PROCALCITONIN: PROCALCITONIN: 0.18 ng/mL

## 2017-05-27 LAB — MRSA PCR SCREENING: MRSA by PCR: NEGATIVE

## 2017-05-27 MED ORDER — SODIUM CHLORIDE 0.9 % IV BOLUS (SEPSIS)
1000.0000 mL | Freq: Once | INTRAVENOUS | Status: AC
  Administered 2017-05-27: 1000 mL via INTRAVENOUS

## 2017-05-27 MED ORDER — DEXTROSE 5 % IV SOLN
1.0000 g | INTRAVENOUS | Status: DC
  Administered 2017-05-28: 1 g via INTRAVENOUS
  Filled 2017-05-27: qty 10

## 2017-05-27 MED ORDER — LACTATED RINGERS IV SOLN
INTRAVENOUS | Status: DC
  Administered 2017-05-27: 20:00:00 via INTRAVENOUS

## 2017-05-27 MED ORDER — OXYCODONE-ACETAMINOPHEN 5-325 MG PO TABS: 1.0000 | ORAL_TABLET | Freq: Four times a day (QID) | ORAL | Status: DC | PRN

## 2017-05-27 MED ORDER — DEXTROSE-NACL 5-0.45 % IV SOLN: INTRAVENOUS | Status: DC

## 2017-05-27 MED ORDER — LEVOTHYROXINE SODIUM 100 MCG PO TABS
100.0000 ug | ORAL_TABLET | Freq: Every day | ORAL | Status: DC
  Administered 2017-05-28 – 2017-05-29 (×2): 100 ug via ORAL
  Filled 2017-05-27 (×2): qty 1

## 2017-05-27 MED ORDER — ASPIRIN 81 MG PO CHEW
81.0000 mg | CHEWABLE_TABLET | Freq: Every day | ORAL | Status: DC
Start: 2017-05-28 — End: 2017-05-29
  Administered 2017-05-28 – 2017-05-29 (×2): 81 mg via ORAL
  Filled 2017-05-27 (×2): qty 1

## 2017-05-27 MED ORDER — SODIUM CHLORIDE 0.9 % IV SOLN
INTRAVENOUS | Status: DC
  Administered 2017-05-27: 22:00:00 via INTRAVENOUS

## 2017-05-27 MED ORDER — SODIUM CHLORIDE 0.9 % IV SOLN
INTRAVENOUS | Status: AC
  Administered 2017-05-27: 21:00:00 via INTRAVENOUS

## 2017-05-27 MED ORDER — ONDANSETRON HCL 4 MG/2ML IJ SOLN
4.0000 mg | Freq: Four times a day (QID) | INTRAMUSCULAR | Status: DC | PRN
  Administered 2017-05-28 – 2017-05-29 (×2): 4 mg via INTRAVENOUS
  Filled 2017-05-27 (×2): qty 2

## 2017-05-27 MED ORDER — CEFTRIAXONE SODIUM IN DEXTROSE 20 MG/ML IV SOLN
1.0000 g | Freq: Once | INTRAVENOUS | Status: AC
  Administered 2017-05-27: 1 g via INTRAVENOUS
  Filled 2017-05-27: qty 50

## 2017-05-27 MED ORDER — IPRATROPIUM-ALBUTEROL 0.5-2.5 (3) MG/3ML IN SOLN: 3.0000 mL | RESPIRATORY_TRACT | Status: DC | PRN

## 2017-05-27 MED ORDER — CLOPIDOGREL BISULFATE 75 MG PO TABS
75.0000 mg | ORAL_TABLET | Freq: Every day | ORAL | Status: DC
  Administered 2017-05-28 – 2017-05-29 (×2): 75 mg via ORAL
  Filled 2017-05-27 (×2): qty 1

## 2017-05-27 MED ORDER — HEPARIN SODIUM (PORCINE) 5000 UNIT/ML IJ SOLN
5000.0000 [IU] | Freq: Three times a day (TID) | INTRAMUSCULAR | Status: DC
  Administered 2017-05-27 – 2017-05-29 (×6): 5000 [IU] via SUBCUTANEOUS
  Filled 2017-05-27 (×4): qty 1

## 2017-05-27 MED ORDER — LIDOCAINE HCL (PF) 1 % IJ SOLN
INTRAMUSCULAR | Status: AC
  Filled 2017-05-27: qty 5

## 2017-05-27 MED ORDER — DEXTROSE-NACL 5-0.45 % IV SOLN
INTRAVENOUS | Status: DC
  Administered 2017-05-28: 01:00:00 via INTRAVENOUS

## 2017-05-27 MED ORDER — FLUTICASONE PROPIONATE 50 MCG/ACT NA SUSP
2.0000 | Freq: Every day | NASAL | Status: DC
  Administered 2017-05-29: 2 via NASAL
  Filled 2017-05-27: qty 16

## 2017-05-27 MED ORDER — SODIUM CHLORIDE 0.9 % IV SOLN
INTRAVENOUS | Status: DC
  Administered 2017-05-27: 5.4 [IU]/h via INTRAVENOUS
  Filled 2017-05-27: qty 1

## 2017-05-27 MED ORDER — ACETAMINOPHEN 325 MG PO TABS: 650.0000 mg | ORAL_TABLET | Freq: Four times a day (QID) | ORAL | Status: DC | PRN

## 2017-05-27 MED ORDER — INSULIN ASPART 100 UNIT/ML ~~LOC~~ SOLN
10.0000 [IU] | Freq: Once | SUBCUTANEOUS | Status: AC
  Administered 2017-05-27: 10 [IU] via INTRAVENOUS
  Filled 2017-05-27: qty 1

## 2017-05-27 MED ORDER — SERTRALINE HCL 25 MG PO TABS
25.0000 mg | ORAL_TABLET | Freq: Every day | ORAL | Status: DC
  Administered 2017-05-28 – 2017-05-29 (×2): 25 mg via ORAL
  Filled 2017-05-27 (×2): qty 1

## 2017-05-27 MED ORDER — SENNOSIDES-DOCUSATE SODIUM 8.6-50 MG PO TABS
2.0000 | ORAL_TABLET | Freq: Every day | ORAL | Status: DC
  Administered 2017-05-28 – 2017-05-29 (×2): 2 via ORAL
  Filled 2017-05-27 (×2): qty 2

## 2017-05-27 MED ORDER — ATORVASTATIN CALCIUM 20 MG PO TABS
80.0000 mg | ORAL_TABLET | Freq: Every day | ORAL | Status: DC
  Administered 2017-05-28: 80 mg via ORAL
  Filled 2017-05-27: qty 4

## 2017-05-27 NOTE — ED Notes (Signed)
Notified receiving nurse LEFT IJ placed by Dr. Quentin Cornwall needs to come out due to non-sterile placement

## 2017-05-27 NOTE — ED Triage Notes (Signed)
Pt presents to ED from [facility] c/o glucose >600 with kussmaul respirations. Altered Mental status on arrival .Concern of DKA

## 2017-05-27 NOTE — Progress Notes (Signed)
[facility] Progress Note Patient Name: [name] [name] DOB: 09/06/1931 MRN: [id number]   Date of Service  05/27/2017  HPI/Events of Note  Admitted from the emergency Department. Case discussed with admitting hospitalist. Patient presenting from nursing facility. Admitted with UA consistent with UTI, acute renal failure, and diabetic ketoacidosis. Started on insulin drip. Started on empiric Rocephin. Has underlying dementia and is DO NOT RESUSCITATE. Previous culture data reviewed showing recurrent Escherichia coli UTI with specimens always sensitive to Rocephin. Patient also cultured out Klebsiella in 2016 also sensitive to Rocephin.   eICU Interventions  Continuing current plan of care as per admitting physician.      Intervention Category Evaluation Type: New Patient Evaluation  Tera Partridge 05/27/2017, 9:29 PM

## 2017-05-27 NOTE — ED Notes (Signed)
Family concerned that IV is in r arm/upper arm due to previous injury. Dr. Quentin Cornwall at bedside to assess situation with family the need for 2nd peripheral access.

## 2017-05-27 NOTE — Consult Note (Signed)
Name: [name] [name] MRN: [id number] DOB: 02-07-1932    ADMISSION DATE:  05/27/2017  CONSULTATION DATE:  05/27/17  REFERRING MD : Dr.Chen  CHIEF COMPLAINT:  hyperglycemia  BRIEF PATIENT DESCRIPTION: 83 year old female from SNF with DKA,UTI and Acute Renal Failure.  SIGNIFICANT EVENTS  05/27/17 Patient admitted to the SDU with DKA, On insulin gtt  STUDIES:  None  HISTORY OF PRESENT ILLNESS:  [name] [name] is an 83 year old female with known history of CAD,COPD,DM,Hyperlipidemia,MI and OSA.  Patient was sent from the nursing home because of elevated blood glucose level. In the ED patient was noted to be demented  And confused with concerns of DKA, with blood glucose>1022.  Patient was started on insulin drip.  Patient was also noted to have UTI therefore was started on antibiotics.  Patient sent to the SDU. CCM team consulted for further management.  PAST MEDICAL HISTORY :   has a past medical history of Anxiety state, unspecified; CAD (coronary artery disease); Cancer [facility]); COPD (chronic obstructive pulmonary disease) ([place]); Diabetes mellitus; Esophageal reflux; Heart burn; Hyperlipidemia; IBS (irritable bowel syndrome); Myocardial infarction ([place]); OSA (obstructive sleep apnea); Osteoporosis (02/21/09); Presbyacusis; Stroke [facility]); Thyroid disease; and Vitamin D deficiency.  has a past surgical history that includes Abdominal hysterectomy; Breast surgery; Colon surgery (2013); and Open reduction internal fixation (orif) distal radial fracture (Right, 05/05/2017). Prior to Admission medications   Medication Sig Start Date End Date Taking? Authorizing Provider  acetaminophen (TYLENOL) 500 MG tablet Take 1,000 mg by mouth every 8 (eight) hours.   Yes [provider]  aspirin 81 MG chewable tablet Chew 81 mg by mouth daily.   Yes [provider]  atorvastatin (LIPITOR) 80 MG tablet Take 1 tablet (80 mg total) by mouth daily. 08/21/16  Yes Cook, Jayce G, DO  clopidogrel  (PLAVIX) 75 MG tablet TAKE 1 TABLET(75 MG) BY MOUTH DAILY 01/01/17  Yes Cook, Jayce G, DO  fluticasone (FLONASE) 50 MCG/ACT nasal spray Place 2 sprays into both nostrils daily. 01/01/17  Yes Cook, Jayce G, DO  insulin aspart (NOVOLOG) 100 UNIT/ML injection Inject 3 Units into the skin 3 (three) times daily with meals. Patient taking differently: Inject 0-10 Units into the skin 3 (three) times daily with meals. 150-200= 3 UNITS 201-250= 5 UNITS 251-300= 7 UNITS 301-350= 8 UNITS 351-400= 10 UNITS 04/07/17  Yes Demetrios Loll, MD  insulin glargine (LANTUS) 100 UNIT/ML injection Inject 0.08 mLs (8 Units total) into the skin daily. Patient taking differently: Inject 15 Units into the skin at bedtime.  05/21/17  Yes Dustin Flock, MD  levothyroxine (SYNTHROID, LEVOTHROID) 100 MCG tablet TAKE 1 TABLET(100 MCG) BY MOUTH DAILY 01/01/17  Yes Cook, Jayce G, DO  metoCLOPramide (REGLAN) 5 MG tablet Take 5 mg by mouth 4 (four) times daily -  before meals and at bedtime.    Yes [provider]  midodrine (PROAMATINE) 10 MG tablet Take 1 tablet (10 mg total) by mouth 3 (three) times daily with meals. 01/30/17  Yes Cook, Jayce G, DO  ondansetron (ZOFRAN-ODT) 8 MG disintegrating tablet Take 8 mg by mouth every 4 (four) hours as needed for nausea or vomiting.   Yes [provider]  polyethylene glycol (MIRALAX / GLYCOLAX) packet Take 17 g by mouth daily. Mix 17 grams in 4 to 8 ounces of fluid and give by mouth daily   Yes [provider]  senna-docusate (SENOKOT-S) 8.6-50 MG tablet Take 2 tablets by mouth daily.   Yes [provider]  sertraline (ZOLOFT) 25 MG tablet Take 1 tablet (25 mg total) by mouth daily after breakfast. 02/17/17  Yes Fritzi Mandes, MD  oxyCODONE (ROXICODONE) 5 MG immediate release tablet Take 0.5 tablets (2.5 mg total) by mouth every 4 (four) hours as needed. Patient taking differently: Take 2.5 mg by mouth every 4 (four) hours as needed for moderate pain.  04/29/17 04/29/18   Rudene Re, MD   Allergies  Allergen Reactions  . Penicillins Rash    Has patient had a PCN reaction causing immediate rash, facial/tongue/throat swelling, SOB or lightheadedness with hypotension: No Has patient had a PCN reaction causing severe rash involving mucus membranes or skin necrosis: No Has patient had a PCN reaction that required hospitalization No Has patient had a PCN reaction occurring within the last 10 years: No If all of the above answers are "NO", then may proceed with Cephalosporin use.     FAMILY HISTORY:  family history includes Diabetes in her mother; Heart attack in her father. SOCIAL HISTORY:  reports that she has never smoked. She has never used smokeless tobacco. She reports that she does not drink alcohol or use drugs.  REVIEW OF SYSTEMS:   Constitutional: Negative for fever, chills, weight loss, malaise/fatigue and diaphoresis.  HENT: Negative for hearing loss, ear pain, nosebleeds, congestion, sore throat, neck pain, tinnitus and ear discharge.   Eyes: Negative for blurred vision, double vision, photophobia, pain, discharge and redness.  Respiratory: Negative for cough, hemoptysis, sputum production, shortness of breath, wheezing and stridor.   Cardiovascular: Negative for chest pain, palpitations, orthopnea, claudication, leg swelling and PND.  Gastrointestinal: Negative for heartburn, nausea, vomiting, abdominal pain, diarrhea, constipation, blood in stool and melena.  Genitourinary: Negative for dysuria, urgency, frequency, hematuria and flank pain.  Musculoskeletal: Negative for myalgias, back pain, joint pain and falls.  Skin: Negative for itching and rash.  Neurological: Negative for dizziness, tingling, tremors, sensory change, speech change, focal weakness, seizures, loss of consciousness, weakness and headaches.  Endo/Heme/Allergies: Negative for environmental allergies and polydipsia. Does not bruise/bleed easily.  SUBJECTIVE: Patient is  confused   VITAL SIGNS: Temp:  [97.5 F (36.4 C)] 97.5 F (36.4 C) (10/31 1900) Pulse Rate:  [133] 133 (10/31 1821) Resp:  [18] 18 (10/31 1821) BP: (142)/(65) 142/65 (10/31 1821) SpO2:  [100 %] 100 % (10/31 1821) Weight:  [128 lb (58.1 kg)] 128 lb (58.1 kg) (10/31 1802)  PHYSICAL EXAMINATION: General:  Elderly confused female, in no acute distress Neuro:  Awake but confused HEENT:  AT,[place],No jvd Cardiovascular:  Tachycardic , regular, no m/r/g Lungs:faint expiratory wheezes,no crackles and rhonchi Abdomen: Soft,NT,ND, Musculoskeletal: +1 edema,no cyanosis Skin: multiple bruises,     Recent Labs Lab 05/21/17 0510 05/27/17 1800  NA 137 129*  K 3.4* 5.9*  CL 109 92*  CO2 23 <7*  BUN 11 23*  CREATININE 0.90 2.26*  GLUCOSE 81 1,022*    Recent Labs Lab 05/21/17 0510 05/27/17 1800  HGB 7.8* 9.2*  HCT 23.4* 31.9*  WBC 4.4 13.2*  PLT 289 373   No results found.  ASSESSMENT / PLAN:  Diabetic Ketoacidosis Metabolic Encephalopathy Sepsis related to UTI Hyponatremia Hyperkalemia Acute Kidney Injury Hx of COPD Multiple falls   Plan Continue I/V fluids Continue Insulin gtt Follow BMP every 4 If blood glucose<250 mg/dl then switch to D51/2 NS Will transition to S/Q when CO2>20 and AG< 12 Continue Rocephin Follow culture Monitor fever,CBC Prn Bonchodilator Fall precautions    Bincy Varughese,AG-ACNP Pulmonary and [place] 05/27/2017,  8:29 PM   Merton Border, MD PCCM service Mobile [phone] Pager [phone] 05/28/2017 6:30 PM

## 2017-05-27 NOTE — H&P (Signed)
[facility] NAME: [name] [name]    MR#:  [id number]  DATE OF BIRTH:  March 13, 1932  DATE OF ADMISSION:  05/27/2017  PRIMARY CARE PHYSICIAN: [name], DO   REQUESTING/REFERRING PHYSICIAN: Merlyn Lot, MD  CHIEF COMPLAINT:   Chief Complaint  Patient presents with  . Hyperglycemia    BS>600   Hyperglycemia and altered mental status. HISTORY OF PRESENT ILLNESS:  [name] [name]  is a 83 y.o. female with a known history of diabetes, CAD, COPD, CVA and OSA. The patient was sent from nursing facility to ED due to above chief complaints. The patient is a demented and confused, unable to provide any information. According to ED physician, the patient was found altered mental status and elevated glucose with concern for DKA. She was found high blood sugar more than 1000, lactic acid 7.4, PH 6.99, AG unable to calculate. She is also found, tachycardia, leukocytosis and UTI. Insulin drip is a started in the ED. The patient is given Rocephin for UTI.  PAST MEDICAL HISTORY:   Past Medical History:  Diagnosis Date  . Anxiety state, unspecified   . CAD (coronary artery disease)   . Cancer [facility])    2004 Right breast, found on mammogram, radiation therapy, Dr. Bryson Ha, uterine cancer,   . COPD (chronic obstructive pulmonary disease) ([place])   . Diabetes mellitus   . Esophageal reflux   . Heart burn   . Hyperlipidemia   . IBS (irritable bowel syndrome)   . Myocardial infarction [facility])    Cath negative except for 40% occlusion LAD.  Pt not candidate for betablocker or ACEI because of hypotension  . OSA (obstructive sleep apnea)   . Osteoporosis 02/21/09   DEXA scan showed osteoporosis with left femur T-score -2.8.  Marland Kitchen Presbyacusis   . Stroke ([place])   . Thyroid disease    Hypothyroid  . Vitamin D deficiency     PAST SURGICAL HISTORY:   Past Surgical History:  Procedure Laterality Date  . ABDOMINAL HYSTERECTOMY     uterine  cancer  . BREAST SURGERY    . COLON SURGERY  2013   done at [facility]  . OPEN REDUCTION INTERNAL FIXATION (ORIF) DISTAL RADIAL FRACTURE Right 05/05/2017   Procedure: OPEN REDUCTION INTERNAL FIXATION (ORIF) DISTAL RADIAL FRACTURE;  Surgeon: Hessie Knows, MD;  Location: [facility] ORS;  Service: Orthopedics;  Laterality: Right;    SOCIAL HISTORY:   Social History  Substance Use Topics  . Smoking status: Never Smoker  . Smokeless tobacco: Never Used  . Alcohol use No    FAMILY HISTORY:   Family History  Problem Relation Age of Onset  . Diabetes Mother   . Heart attack Father     DRUG ALLERGIES:   Allergies  Allergen Reactions  . Penicillins Rash    Has patient had a PCN reaction causing immediate rash, facial/tongue/throat swelling, SOB or lightheadedness with hypotension: No Has patient had a PCN reaction causing severe rash involving mucus membranes or skin necrosis: No Has patient had a PCN reaction that required hospitalization No Has patient had a PCN reaction occurring within the last 10 years: No If all of the above answers are "NO", then may proceed with Cephalosporin use.     REVIEW OF SYSTEMS:   Review of Systems  Unable to perform ROS: Dementia    MEDICATIONS AT HOME:   Prior to Admission medications   Medication Sig Start Date End Date Taking? Authorizing Provider  acetaminophen (TYLENOL) 500 MG tablet Take 1,000 mg by mouth every 8 (eight) hours.   Yes [provider]  aspirin 81 MG chewable tablet Chew 81 mg by mouth daily.   Yes [provider]  atorvastatin (LIPITOR) 80 MG tablet Take 1 tablet (80 mg total) by mouth daily. 08/21/16  Yes Cook, Jayce G, DO  clopidogrel (PLAVIX) 75 MG tablet TAKE 1 TABLET(75 MG) BY MOUTH DAILY 01/01/17  Yes Cook, Jayce G, DO  fluticasone (FLONASE) 50 MCG/ACT nasal spray Place 2 sprays into both nostrils daily. 01/01/17  Yes Cook, Jayce G, DO  insulin aspart (NOVOLOG) 100 UNIT/ML injection Inject 3 Units into the skin 3  (three) times daily with meals. Patient taking differently: Inject 0-10 Units into the skin 3 (three) times daily with meals. 150-200= 3 UNITS 201-250= 5 UNITS 251-300= 7 UNITS 301-350= 8 UNITS 351-400= 10 UNITS 04/07/17  Yes Demetrios Loll, MD  insulin glargine (LANTUS) 100 UNIT/ML injection Inject 0.08 mLs (8 Units total) into the skin daily. Patient taking differently: Inject 15 Units into the skin at bedtime.  05/21/17  Yes Dustin Flock, MD  levothyroxine (SYNTHROID, LEVOTHROID) 100 MCG tablet TAKE 1 TABLET(100 MCG) BY MOUTH DAILY 01/01/17  Yes Cook, Jayce G, DO  metoCLOPramide (REGLAN) 5 MG tablet Take 5 mg by mouth 4 (four) times daily -  before meals and at bedtime.    Yes [provider]  midodrine (PROAMATINE) 10 MG tablet Take 1 tablet (10 mg total) by mouth 3 (three) times daily with meals. 01/30/17  Yes Cook, Jayce G, DO  ondansetron (ZOFRAN-ODT) 8 MG disintegrating tablet Take 8 mg by mouth every 4 (four) hours as needed for nausea or vomiting.   Yes [provider]  polyethylene glycol (MIRALAX / GLYCOLAX) packet Take 17 g by mouth daily. Mix 17 grams in 4 to 8 ounces of fluid and give by mouth daily   Yes [provider]  senna-docusate (SENOKOT-S) 8.6-50 MG tablet Take 2 tablets by mouth daily.   Yes [provider]  sertraline (ZOLOFT) 25 MG tablet Take 1 tablet (25 mg total) by mouth daily after breakfast. 02/17/17  Yes Fritzi Mandes, MD  oxyCODONE (ROXICODONE) 5 MG immediate release tablet Take 0.5 tablets (2.5 mg total) by mouth every 4 (four) hours as needed. Patient taking differently: Take 2.5 mg by mouth every 4 (four) hours as needed for moderate pain.  04/29/17 04/29/18  Rudene Re, MD      VITAL SIGNS:  Blood pressure (!) 142/65, pulse (!) 133, temperature (!) 97.5 F (36.4 C), temperature source Core (Comment), resp. rate 18, height 5\' 4"  (1.626 m), weight 128 lb (58.1 kg), SpO2 100 %.  PHYSICAL EXAMINATION:  Physical  Exam  GENERAL:  83 y.o.-year-old patient lying in the bed with no acute distress.  EYES: Pupils equal, round, reactive to light and accommodation. No scleral icterus. Extraocular muscles intact.  HEENT: Head atraumatic, normocephalic. Dry oral mucosa. NECK:  Supple, no jugular venous distention. No thyroid enlargement, no tenderness.  LUNGS: Normal breath sounds bilaterally, no wheezing, rales,rhonchi or crepitation. No use of accessory muscles of respiration.  CARDIOVASCULAR: S1, S2 normal. No murmurs, rubs, or gallops.  ABDOMEN: Soft, nontender, nondistended. Bowel sounds present. No organomegaly or mass.  EXTREMITIES: No pedal edema, cyanosis, or clubbing.  NEUROLOGIC: Unable to exam. PSYCHIATRIC: The patient is very demented and confused. SKIN: No obvious rash, lesion, or ulcer.   LABORATORY PANEL:   CBC  Recent Labs Lab 05/27/17 1800  WBC 13.2*  HGB 9.2*  HCT 31.9*  PLT 373   ------------------------------------------------------------------------------------------------------------------  Chemistries   Recent Labs Lab 05/27/17 1800  NA 129*  K 5.9*  CL 92*  CO2 <7*  GLUCOSE 1,022*  BUN 23*  CREATININE 2.26*  CALCIUM 9.5   ------------------------------------------------------------------------------------------------------------------  Cardiac Enzymes No results for input(s): TROPONINI in the last 168 hours. ------------------------------------------------------------------------------------------------------------------  RADIOLOGY:  No results found.    IMPRESSION AND PLAN:   DKA and diabetes. The patient will be admitted to stepdown unit. Nothing by mouth with IV fluid support. Continue insulin drip, follow-up BMP every 4 hours.  Sepsis due to UTI. Start sepsis protocol, follow-up blood culture and urine culture, continue Rocephin.  Acute renal failure, ATN,  due to above. Continue IV fluid support and follow-up BMP.  Hyperkalemia. Continue  insulin drip and follow-up potassium level.  Dementia. Aspiration and fall precaution.  I discussed with intensivist. All the records are reviewed and case discussed with ED provider. Management plans discussed with the patient's daughter POA and they are in agreement.  CODE STATUS: DO NOT RESUSCITATE  TOTAL CRITICAL TIME TAKING CARE OF THIS PATIENT: 58 minutes.    Demetrios Loll M.D on 05/27/2017 at 8:21 PM  Between 7am to 6pm - Pager - [phone]  After 6pm go to [email or web address] - Proofreader  [facility]  Office  [phone]  CC: Primary care physician; [name], DO   Note: This dictation was prepared with Dragon dictation along with smaller phrase technology. Any transcriptional errors that result from this process are unin

## 2017-05-27 NOTE — Progress Notes (Signed)
Pharmacy Antibiotic Note  [name] [name] is a 83 y.o. female admitted on 05/27/2017 with UTI.  Pharmacy has been consulted for Ceftriaxone dosing.  Plan: Ceftriaxone 1g IV q24h  Height: 5\' 4"  (162.6 cm) Weight: 128 lb (58.1 kg) IBW/kg (Calculated) : 54.7  Temp (24hrs), Avg:97.5 F (36.4 C), Min:97.5 F (36.4 C), Max:97.5 F (36.4 C)   Recent Labs Lab 05/21/17 0510 05/27/17 1800  WBC 4.4 13.2*  CREATININE 0.90 2.26*  LATICACIDVEN  --  7.4*    Estimated Creatinine Clearance: 16.3 mL/min (A) (by C-G formula based on SCr of 2.26 mg/dL (H)).    Allergies  Allergen Reactions  . Penicillins Rash    Has patient had a PCN reaction causing immediate rash, facial/tongue/throat swelling, SOB or lightheadedness with hypotension: No Has patient had a PCN reaction causing severe rash involving mucus membranes or skin necrosis: No Has patient had a PCN reaction that required hospitalization No Has patient had a PCN reaction occurring within the last 10 years: No If all of the above answers are "NO", then may proceed with Cephalosporin use.     Antimicrobials this admission: Ceftriaxone 10/31 >>   Thank you for allowing pharmacy to be a part of this patient's care.  Paulina Fusi, PharmD, BCPS 05/27/2017 9:29 PM

## 2017-05-27 NOTE — ED Provider Notes (Signed)
[facility] Emergency Department Provider Note    None    (approximate)  I have reviewed the triage vital signs and the nursing notes.   HISTORY  Chief Complaint Hyperglycemia (BS>600)  Level V Caveat:  Acute metabolic encephalopathy, resp distress  HPI [name] [name] is a 83 y.o. female with below chronic medical conditions presents with chief com complaint of altered mental status and elevated glucose with concern for DKA.  Patient has insulin dependent diabetic and has had blood sugars that were increasing throughout the day.  Patient arrives with kussmaul respirations drowsy, protecting her airway but not cooperative with exam and unable to provide any additional history.   Past Medical History:  Diagnosis Date  . Anxiety state, unspecified   . CAD (coronary artery disease)   . Cancer [facility])    2004 Right breast, found on mammogram, radiation therapy, Dr. Bryson Ha, uterine cancer,   . COPD (chronic obstructive pulmonary disease) ([place])   . Diabetes mellitus   . Esophageal reflux   . Heart burn   . Hyperlipidemia   . IBS (irritable bowel syndrome)   . Myocardial infarction [facility])    Cath negative except for 40% occlusion LAD.  Pt not candidate for betablocker or ACEI because of hypotension  . OSA (obstructive sleep apnea)   . Osteoporosis 02/21/09   DEXA scan showed osteoporosis with left femur T-score -2.8.  Marland Kitchen Presbyacusis   . Stroke ([place])   . Thyroid disease    Hypothyroid  . Vitamin D deficiency    Family History  Problem Relation Age of Onset  . Diabetes Mother   . Heart attack Father    Past Surgical History:  Procedure Laterality Date  . ABDOMINAL HYSTERECTOMY     uterine cancer  . BREAST SURGERY    . COLON SURGERY  2013   done at [facility]  . OPEN REDUCTION INTERNAL FIXATION (ORIF) DISTAL RADIAL FRACTURE Right 05/05/2017   Procedure: OPEN REDUCTION INTERNAL FIXATION (ORIF) DISTAL RADIAL FRACTURE;  Surgeon: Hessie Knows, MD;   Location: [facility] ORS;  Service: Orthopedics;  Laterality: Right;   Patient Active Problem List   Diagnosis Date Noted  . C. difficile colitis 05/20/2017  . Hyperglycemia   . Acute kidney injury ([place])   . Metabolic encephalopathy 41/96/2229  . Adjustment disorder with mixed anxiety and depressed mood   . Coffee ground emesis 02/12/2017  . Acute on chronic renal failure ([place]) 01/13/2017  . Dementia, vascular 12/03/2016  . Stroke ([place]) 08/20/2016  . Peripheral vascular disease ([place]) 04/09/2016  . DKA (diabetic ketoacidoses) ([place]) 06/08/2015  . CKD stage 3 due to type 2 diabetes mellitus ([place]) 02/22/2014  . Hypothyroidism 09/23/2012  . GERD (gastroesophageal reflux disease) 09/23/2012  . Anxiety 07/26/2012  . Hyperlipidemia with target LDL less than 70 12/03/2011  . DM (diabetes mellitus) type II uncontrolled, periph vascular disorder ([place]) 04/03/2011  . CAD (coronary artery disease) 12/13/2009      Prior to Admission medications   Medication Sig Start Date End Date Taking? Authorizing Provider  acetaminophen (TYLENOL) 500 MG tablet Take 1,000 mg by mouth every 8 (eight) hours.   Yes [provider]  aspirin 81 MG chewable tablet Chew 81 mg by mouth daily.   Yes [provider]  atorvastatin (LIPITOR) 80 MG tablet Take 1 tablet (80 mg total) by mouth daily. 08/21/16  Yes Cook, Jayce G, DO  clopidogrel (PLAVIX) 75 MG tablet TAKE 1 TABLET(75 MG) BY MOUTH DAILY 01/01/17  Yes Thersa Salt  G, DO  fluticasone (FLONASE) 50 MCG/ACT nasal spray Place 2 sprays into both nostrils daily. 01/01/17  Yes Cook, Jayce G, DO  insulin aspart (NOVOLOG) 100 UNIT/ML injection Inject 3 Units into the skin 3 (three) times daily with meals. Patient taking differently: Inject 0-10 Units into the skin 3 (three) times daily with meals. 150-200= 3 UNITS 201-250= 5 UNITS 251-300= 7 UNITS 301-350= 8 UNITS 351-400= 10 UNITS 04/07/17  Yes Demetrios Loll, MD  insulin glargine (LANTUS) 100 UNIT/ML injection  Inject 0.08 mLs (8 Units total) into the skin daily. Patient taking differently: Inject 15 Units into the skin at bedtime.  05/21/17  Yes Dustin Flock, MD  levothyroxine (SYNTHROID, LEVOTHROID) 100 MCG tablet TAKE 1 TABLET(100 MCG) BY MOUTH DAILY 01/01/17  Yes Cook, Jayce G, DO  metoCLOPramide (REGLAN) 5 MG tablet Take 5 mg by mouth 4 (four) times daily -  before meals and at bedtime.    Yes [provider]  midodrine (PROAMATINE) 10 MG tablet Take 1 tablet (10 mg total) by mouth 3 (three) times daily with meals. 01/30/17  Yes Cook, Jayce G, DO  ondansetron (ZOFRAN-ODT) 8 MG disintegrating tablet Take 8 mg by mouth every 4 (four) hours as needed for nausea or vomiting.   Yes [provider]  polyethylene glycol (MIRALAX / GLYCOLAX) packet Take 17 g by mouth daily. Mix 17 grams in 4 to 8 ounces of fluid and give by mouth daily   Yes [provider]  senna-docusate (SENOKOT-S) 8.6-50 MG tablet Take 2 tablets by mouth daily.   Yes [provider]  sertraline (ZOLOFT) 25 MG tablet Take 1 tablet (25 mg total) by mouth daily after breakfast. 02/17/17  Yes Fritzi Mandes, MD  oxyCODONE (ROXICODONE) 5 MG immediate release tablet Take 0.5 tablets (2.5 mg total) by mouth every 4 (four) hours as needed. Patient taking differently: Take 2.5 mg by mouth every 4 (four) hours as needed for moderate pain.  04/29/17 04/29/18  Rudene Re, MD    Allergies Penicillins    Social History Social History  Substance Use Topics  . Smoking status: Never Smoker  . Smokeless tobacco: Never Used  . Alcohol use No    Review of Systems Patient denies headaches, rhinorrhea, blurry vision, numbness, shortness of breath, chest pain, edema, cough, abdominal pain, nausea, vomiting, diarrhea, dysuria, fevers, rashes or hallucinations unless otherwise stated above in HPI. ____________________________________________   PHYSICAL EXAM:  VITAL SIGNS: Vitals:   05/27/17 1900 05/27/17 2035   BP:  (!) 134/47  Pulse:  (!) 131  Resp:  18  Temp: (!) 97.5 F (36.4 C)   SpO2:  100%    Constitutional: Critically ill with kussmaul respirations. Eyes: Conjunctivae are normal.  Head: healing ecchymosis to right face Nose: No congestion/rhinnorhea. Mouth/Throat: Mucous membranes are moist.   Neck: No stridor. Painless ROM.  Cardiovascular: tachycardic, poor perfusion distally. Grossly normal heart sounds.  Good peripheral circulation. Respiratory: kussmaul respirations, good airmovement throughout Gastrointestinal: Soft and nontender. No distention. No abdominal bruits. No CVA tenderness. Genitourinary: noraml external genitalia Musculoskeletal: No lower extremity tenderness nor edema.  No joint effusions. Neurologic:  Unable to complete exam, will open eyes to voice.  Skin:  Skin is warm, dry and intact. No rash noted. Psychiatric: unable to assess ____________________________________________   LABS (all labs ordered are listed, but only abnormal results are displayed)  Results for orders placed or performed during the hospital encounter of 05/27/17 (from the past 24 hour(s))  CBC with Differential/Platelet  Status: Abnormal   Collection Time: 05/27/17  6:00 PM  Result Value Ref Range   WBC 13.2 (H) 3.6 - 11.0 K/uL   RBC 2.76 (L) 3.80 - 5.20 MIL/uL   Hemoglobin 9.2 (L) 12.0 - 16.0 g/dL   HCT 31.9 (L) 35.0 - 47.0 %   MCV 115.3 (H) 80.0 - 100.0 fL   MCH 33.2 26.0 - 34.0 pg   MCHC 28.8 (L) 32.0 - 36.0 g/dL   RDW 20.4 (H) 11.5 - 14.5 %   Platelets 373 150 - 440 K/uL   Neutrophils Relative % 88 %   Neutro Abs 11.8 (H) 1.4 - 6.5 K/uL   Lymphocytes Relative 5 %   Lymphs Abs 0.6 (L) 1.0 - 3.6 K/uL   Monocytes Relative 6 %   Monocytes Absolute 0.7 0.2 - 0.9 K/uL   Eosinophils Relative 0 %   Eosinophils Absolute 0.0 0 - 0.7 K/uL   Basophils Relative 1 %   Basophils Absolute 0.1 0 - 0.1 K/uL  Lactic acid, plasma     Status: Abnormal   Collection Time: 05/27/17  6:00 PM    Result Value Ref Range   Lactic Acid, Venous 7.4 (HH) 0.5 - 1.9 mmol/L  Basic metabolic panel     Status: Abnormal   Collection Time: 05/27/17  6:00 PM  Result Value Ref Range   Sodium 129 (L) 135 - 145 mmol/L   Potassium 5.9 (H) 3.5 - 5.1 mmol/L   Chloride 92 (L) 101 - 111 mmol/L   CO2 <7 (L) 22 - 32 mmol/L   Glucose, Bld 1,022 (HH) 65 - 99 mg/dL   BUN 23 (H) 6 - 20 mg/dL   Creatinine, Ser 2.26 (H) 0.44 - 1.00 mg/dL   Calcium 9.5 8.9 - 10.3 mg/dL   GFR calc non Af Amer 19 (L) >60 mL/min   GFR calc Af Amer 22 (L) >60 mL/min   Anion gap NOT CALCULATED 5 - 15  Urinalysis, Complete w Microscopic     Status: Abnormal   Collection Time: 05/27/17  6:00 PM  Result Value Ref Range   Color, Urine YELLOW (A) YELLOW   APPearance CLOUDY (A) CLEAR   Specific Gravity, Urine 1.017 1.005 - 1.030   pH 5.0 5.0 - 8.0   Glucose, UA >=500 (A) NEGATIVE mg/dL   Hgb urine dipstick SMALL (A) NEGATIVE   Bilirubin Urine NEGATIVE NEGATIVE   Ketones, ur 20 (A) NEGATIVE mg/dL   Protein, ur NEGATIVE NEGATIVE mg/dL   Nitrite NEGATIVE NEGATIVE   Leukocytes, UA SMALL (A) NEGATIVE   RBC / HPF 0-5 0 - 5 RBC/hpf   WBC, UA 6-30 0 - 5 WBC/hpf   Bacteria, UA MANY (A) NONE SEEN   Squamous Epithelial / LPF 6-30 (A) NONE SEEN   Mucus PRESENT    Hyaline Casts, UA PRESENT   Blood gas, venous     Status: Abnormal   Collection Time: 05/27/17  6:00 PM  Result Value Ref Range   pH, Ven 6.99 (LL) 7.250 - 7.430   pCO2, Ven 19 (LL) 44.0 - 60.0 mmHg   pO2, Ven 53.0 (H) 32.0 - 45.0 mmHg   Patient temperature 37.0    Collection site VEIN    Sample type VENOUS   Osmolality     Status: Abnormal   Collection Time: 05/27/17  6:00 PM  Result Value Ref Range   Osmolality 348 (HH) 275 - 295 mOsm/kg  Glucose, capillary     Status: Abnormal   Collection Time: 05/27/17  6:02 PM  Result Value Ref Range   Glucose-Capillary >600 (HH) 65 - 99 mg/dL  Blood gas, venous     Status: Abnormal   Collection Time: 05/27/17  7:35 PM   Result Value Ref Range   pH, Ven 6.91 (LL) 7.250 - 7.430   pCO2, Ven 22 (L) 44.0 - 60.0 mmHg   pO2, Ven 41.0 32.0 - 45.0 mmHg   Bicarbonate 4.4 (L) 20.0 - 28.0 mmol/L   Acid-base deficit 27.5 (H) 0.0 - 2.0 mmol/L   O2 Saturation 39.4 %   Patient temperature 37.0    Collection site VENOUS    Sample type VENOUS   Glucose, capillary     Status: Abnormal   Collection Time: 05/27/17  8:18 PM  Result Value Ref Range   Glucose-Capillary >600 (HH) 65 - 99 mg/dL   ____________________________________________  EKG My review and personal interpretation at Time: 18:05   Indication: ams  Rate: 130  Rhythm: sinus tachycardia Axis: normal intervals Other: no stemi, nonspecific st and t-wave changes, likely rate dependent ____________________________________________  RADIOLOGY  I personally reviewed all radiographic images ordered to evaluate for the above acute complaints and reviewed radiology reports and findings.  These findings were personally discussed with the patient.  Please see medical record for radiology report.  ____________________________________________   PROCEDURES  Procedure(s) performed:  Procedures Due to difficulty with obtaining IV access, a twin cath 20/22G peripheral IV catheter was inserted using US guidance into the Left EJ.  The site was prepped with chlorhexidine and allowed to dry.  The patient tolerated the procedure without any complications.  This line should be removed as soon as possible.  Sterile precautions were used to the greatest extent possible but during her critical presentation full barrier protection was limited.    Critical Care performed: yes CRITICAL CARE Performed by: Merlyn Lot   Total critical care time: 50 minutes  Critical care time was exclusive of separately billable procedures and treating other patients.  Critical care was necessary to treat or prevent imminent or life-threatening deterioration.  Critical care was time  spent personally by me on the following activities: development of treatment plan with patient and/or surrogate as well as nursing, discussions with consultants, evaluation of patient's response to treatment, examination of patient, obtaining history from patient or surrogate, ordering and performing treatments and interventions, ordering and review of laboratory studies, ordering and review of radiographic studies, pulse oximetry and re-evaluation of patient's condition.  ____________________________________________   INITIAL IMPRESSION / ASSESSMENT AND PLAN / ED COURSE  Pertinent labs & imaging results that were available during my care of the patient were reviewed by me and considered in my medical decision making (see chart for details).  DDX: dka, hhns, sepsis, edema, acs, dehydration, aki, electrolyte abn  [name] [name] is a 83 y.o. who presents to the ED with the above presentation.  Patient is critically ill appearing.  Difficulty with obtaining IV access required emergent placement of left EGA twin cath for large volume resuscitation.  Stat glucose was greater than 600.  She is currently protecting her airway with 2 small respirations.  We will continue resuscitation, evaluate for signs of sepsis and DKA.  The patient will be placed on continuous pulse oximetry and telemetry for monitoring.  Laboratory evaluation will be sent to evaluate for the above complaints.     Clinical Course as of May 27 2050  Wed May 27, 2017  1852 Patient with profound high anion gap metabolic acidosis pseudohyponatremia, acute  hyperkalemia essentially undetectable bicarb and AK I.  Venous blood gas with pH of 6.9.  Patient was stable anemia and acute leukocytosis.  We will continue large volume IV fluid resuscitation as well as start IV insulin.  Patient does seem to be slightly improving though her acute small respirations and critical illness with very poor prognosis remains.  [PR]  1918 Mentation is  improving.  Patient does have urinary tract infection will give IV Rocephin as she previously grew out E. coli.  [PR]  1929 Patient with profound elevated osmolality.  [PR]  1931 Seems like a mixed picture given profound elevation of glucose and hyper osmolality but does have ketones and had a high anion gap.  Has had DKA in the past.  To see if this is just secondary to profound dehydration which is improving with IV fluids and large volume resuscitation.  Patient's mentation continued to improve.  Family will be updated at bedside.  Patient will be admitted to the ICU for further evaluation and management.  [PR]  1945 Patient with profound lactic acidosis of 7.4 Mentation improving.  Family updated at bedside and state that she is a DNR and would not want to be intubated or for CPR to be performed.    [PR]    Clinical Course User Index [PR] Merlyn Lot, MD     ____________________________________________   FINAL CLINICAL IMPRESSION(S) / ED DIAGNOSES  Final diagnoses:  Diabetic ketoacidosis with coma associated with diabetes mellitus due to underlying condition ([place])  Acute cystitis without hematuria  Acute metabolic encephalopathy  Lactic acidosis      NEW MEDICATIONS STARTED DURING THIS VISIT:  New Prescriptions   No medications on file     Note:  This document was prepared using Dragon voice recognition software and may include unintentional dictation errors.    Merlyn Lot, MD 05/27/17 2051

## 2017-05-28 LAB — BASIC METABOLIC PANEL
Anion gap: 10 (ref 5–15)
Anion gap: 6 (ref 5–15)
Anion gap: 6 (ref 5–15)
BUN: 19 mg/dL (ref 6–20)
BUN: 17 mg/dL (ref 6–20)
BUN: 18 mg/dL (ref 6–20)
CALCIUM: 8.1 mg/dL — AB (ref 8.9–10.3)
CALCIUM: 8.4 mg/dL — AB (ref 8.9–10.3)
CALCIUM: 8.6 mg/dL — AB (ref 8.9–10.3)
CO2: 15 mmol/L — ABNORMAL LOW (ref 22–32)
CO2: 19 mmol/L — AB (ref 22–32)
CO2: 21 mmol/L — AB (ref 22–32)
CREATININE: 1.27 mg/dL — AB (ref 0.44–1.00)
CREATININE: 1.29 mg/dL — AB (ref 0.44–1.00)
CREATININE: 1.58 mg/dL — AB (ref 0.44–1.00)
Chloride: 115 mmol/L — ABNORMAL HIGH (ref 101–111)
Chloride: 114 mmol/L — ABNORMAL HIGH (ref 101–111)
Chloride: 112 mmol/L — ABNORMAL HIGH (ref 101–111)
GFR calc non Af Amer: 29 mL/min — ABNORMAL LOW (ref >60)
GFR calc non Af Amer: 37 mL/min — ABNORMAL LOW (ref >60)
GFR calc non Af Amer: 38 mL/min — ABNORMAL LOW (ref >60)
GFR, EST AFRICAN AMERICAN: 34 mL/min — AB (ref >60)
GFR, EST AFRICAN AMERICAN: 43 mL/min — AB (ref >60)
GFR, EST AFRICAN AMERICAN: 44 mL/min — AB (ref >60)
Glucose, Bld: 241 mg/dL — ABNORMAL HIGH (ref 65–99)
Glucose, Bld: 111 mg/dL — ABNORMAL HIGH (ref 65–99)
Glucose, Bld: 172 mg/dL — ABNORMAL HIGH (ref 65–99)
Potassium: 3.1 mmol/L — ABNORMAL LOW (ref 3.5–5.1)
Potassium: 3.6 mmol/L (ref 3.5–5.1)
Potassium: 3.8 mmol/L (ref 3.5–5.1)
SODIUM: 137 mmol/L (ref 135–145)
SODIUM: 140 mmol/L (ref 135–145)
SODIUM: 141 mmol/L (ref 135–145)

## 2017-05-28 LAB — GLUCOSE, CAPILLARY
GLUCOSE-CAPILLARY: 108 mg/dL — AB (ref 65–99)
GLUCOSE-CAPILLARY: 134 mg/dL — AB (ref 65–99)
GLUCOSE-CAPILLARY: 167 mg/dL — AB (ref 65–99)
GLUCOSE-CAPILLARY: 269 mg/dL — AB (ref 65–99)
Glucose-Capillary: 197 mg/dL — ABNORMAL HIGH (ref 65–99)
Glucose-Capillary: 146 mg/dL — ABNORMAL HIGH (ref 65–99)
Glucose-Capillary: 78 mg/dL (ref 65–99)
Glucose-Capillary: 97 mg/dL (ref 65–99)
Glucose-Capillary: 116 mg/dL — ABNORMAL HIGH (ref 65–99)
Glucose-Capillary: 155 mg/dL — ABNORMAL HIGH (ref 65–99)
Glucose-Capillary: 123 mg/dL — ABNORMAL HIGH (ref 65–99)
Glucose-Capillary: 119 mg/dL — ABNORMAL HIGH (ref 65–99)

## 2017-05-28 LAB — HEMOGLOBIN A1C
Hgb A1c MFr Bld: 8.7 % — ABNORMAL HIGH (ref 4.8–5.6)
Mean Plasma Glucose: 202.99 mg/dL

## 2017-05-28 LAB — MAGNESIUM: Magnesium: 1.5 mg/dL — ABNORMAL LOW (ref 1.7–2.4)

## 2017-05-28 MED ORDER — INSULIN ASPART 100 UNIT/ML ~~LOC~~ SOLN
0.0000 [IU] | SUBCUTANEOUS | Status: DC
  Administered 2017-05-28: 3 [IU] via SUBCUTANEOUS
  Administered 2017-05-28 (×2): 2 [IU] via SUBCUTANEOUS
  Administered 2017-05-29: 5 [IU] via SUBCUTANEOUS
  Administered 2017-05-29 (×2): 2 [IU] via SUBCUTANEOUS
  Filled 2017-05-28 (×6): qty 1

## 2017-05-28 MED ORDER — SODIUM CHLORIDE 0.9 % IV SOLN: INTRAVENOUS | Status: DC

## 2017-05-28 MED ORDER — POTASSIUM CHLORIDE 10 MEQ/100ML IV SOLN
10.0000 meq | INTRAVENOUS | Status: AC
  Administered 2017-05-28 (×3): 10 meq via INTRAVENOUS
  Filled 2017-05-28 (×3): qty 100

## 2017-05-28 MED ORDER — INSULIN GLARGINE 100 UNIT/ML ~~LOC~~ SOLN
15.0000 [IU] | Freq: Every day | SUBCUTANEOUS | Status: DC
  Administered 2017-05-28 – 2017-05-29 (×2): 15 [IU] via SUBCUTANEOUS
  Filled 2017-05-28 (×2): qty 0.15

## 2017-05-28 MED ORDER — MAGNESIUM SULFATE 2 GM/50ML IV SOLN
2.0000 g | Freq: Once | INTRAVENOUS | Status: AC
  Administered 2017-05-28: 2 g via INTRAVENOUS
  Filled 2017-05-28: qty 50

## 2017-05-28 NOTE — Progress Notes (Signed)
Pt being transferred to room 210. Report called to Barnett Applebaum, Therapist, sports. Pt and belongings transferred to room 210 without incident.

## 2017-05-28 NOTE — Progress Notes (Signed)
Inpatient Diabetes Program Recommendations  AACE/ADA: New Consensus Statement on Inpatient Glycemic Control (2015)  Target Ranges:  Prepandial:   less than 140 mg/dL      Peak postprandial:   less than 180 mg/dL (1-2 hours)      Critically ill patients:  140 - 180 mg/dL   Lab Results  Component Value Date   GLUCAP 167 (H) 05/28/2017   HGBA1C 7.9 (H) 04/05/2017   Results for BIANKA, LIBERATI (MRN [id number]) as of 05/28/2017 08:37  Ref. Range 05/28/2017 04:07 05/28/2017 05:05 05/28/2017 06:06 05/28/2017 07:04 05/28/2017 07:55  Glucose-Capillary Latest Ref Range: 65 - 99 mg/dL 78 97 116 (H) 134 (H) 167 (H)   Outpatient DM meds:                                      Novolog 3-10 units TID                                     Lantus 15 units qhs                                  Current Orders: Lantus 15 units QHS                            Novolog moderate Correction Scale/ SSI (0-15 units) Q4 hours    Agree with current medications for blood sugar management.   Gentry Fitz, RN, BA, MHA, CDE Diabetes Coordinator Inpatient Diabetes Program  [phone] (Team Pager) [phone] ([place]) 05/28/2017 8:41 AM

## 2017-05-28 NOTE — Clinical Social Work Note (Signed)
Clinical Social Work Assessment  Patient Details  Name: [name] [name] MRN: [id number] Date of Birth: 03/12/1932  Date of referral:  05/28/17               Reason for consult:  Discharge Planning                Permission sought to share information with:    Permission granted to share information::     Name::        Agency::     Relationship::     Contact Information:     Housing/Transportation Living arrangements for the past 2 months:  [place] of Information:  Facility Patient Interpreter Needed:  None Criminal Activity/Legal Involvement Pertinent to Current Situation/Hospitalization:  No - Comment as needed Significant Relationships:  None Lives with:  Facility Resident Do you feel safe going back to the place where you live?    Need for family participation in patient care:     Care giving concerns:  Patient resides at [facility].   Social Worker assessment / plan:  CSW was able to speak with Thayer Headings at [facility]. She stated patient was at [facility] in the recent past for rehab and fell at [facility] and broke her wrist. Patient returned to [place] [facility] on 10/25. Unknown what patient's discharge disposition will be at this time due to patient currently being in ICU and not being mobile. Thayer Headings requests [place] to keep her posted. CSW will also speak with patient's family regarding developing a discharge plan.  Employment status:  Retired Nurse, adult PT Recommendations:    Information / Referral to community resources:     Patient/Family's Response to care:  Thayer Headings expressed appreciation for CSW assistance.  Patient/Family's Understanding of and Emotional Response to Diagnosis, Current Treatment, and Prognosis:  Thayer Headings has stated that they were just at the orthopedics office yesterday and then she became sick fairly suddenly. She is concerned about her blood sugars due to them being so high  on admission.  Emotional Assessment Appearance:    Attitude/Demeanor/Rapport:    Affect (typically observed):    Orientation:    Alcohol / Substance use:  Not Applicable Psych involvement (Current and /or in the community):  No (Comment)  Discharge Needs  Concerns to be addressed:  Care Coordination Readmission within the last 30 days:  Yes Current discharge risk:  None Barriers to Discharge:  No Barriers Identified   Shela Leff, LCSW 05/28/2017, 4:29 PM

## 2017-05-28 NOTE — Progress Notes (Signed)
Pt came to floor at 1830. VSS.

## 2017-05-28 NOTE — Care Management (Signed)
This is the sixth presentation to hospital over last six months for this unfortunate patient.  I have requested [place] review from DR. Sparks PA and [facility].

## 2017-05-28 NOTE — Progress Notes (Signed)
[place] [facility] [place] NAME: [name] [name]    MR#:  [id number]  DATE OF BIRTH:  29-Sep-1931  SUBJECTIVE:   Patient with dementia She doesn't talk much at baseline  REVIEW OF SYSTEMS:    Unable to perform due to dementia   Tolerating Diet: yes     DRUG ALLERGIES:   Allergies  Allergen Reactions  . Penicillins Rash    Has patient had a PCN reaction causing immediate rash, facial/tongue/throat swelling, SOB or lightheadedness with hypotension: No Has patient had a PCN reaction causing severe rash involving mucus membranes or skin necrosis: No Has patient had a PCN reaction that required hospitalization No Has patient had a PCN reaction occurring within the last 10 years: No If all of the above answers are "NO", then may proceed with Cephalosporin use.     VITALS:  Blood pressure 106/61, pulse 88, temperature 98.5 F (36.9 C), temperature source Axillary, resp. rate 13, height 5\' 4"  (1.626 m), weight 58.1 kg (128 lb), SpO2 97 %.  PHYSICAL EXAMINATION:  Constitutional: lying in bed. No distress. HENT: Normocephalic. Marland Kitchen Oropharynx is clear and moist.  Eyes:  PERRLA, no scleral icterus.  Neck: Normal ROM. Neck supple. No JVD. No tracheal deviation. CVS: RRR, S1/S2 +, no murmurs, no gallops, no carotid bruit.  Pulmonary: Effort and breath sounds normal, no stridor, rhonchi, wheezes, rales.  Abdominal: Soft. BS +,  no distension, tenderness, rebound or guarding.  Musculoskeletal: Normal range of motion. No edema and no tenderness.  Neuro: Alert. CN 2-12 grossly intact. No focal deficits. Skin: she has so many bruises on her legs Psychiatric: Dementia     LABORATORY PANEL:   CBC  Recent Labs Lab 05/27/17 1800  WBC 13.2*  HGB 9.2*  HCT 31.9*  PLT 373   ------------------------------------------------------------------------------------------------------------------  Chemistries   Recent Labs Lab 05/28/17 0521  05/28/17 0830  NA 141 137  K 3.6 3.8  CL 114* 112*  CO2 21* 19*  GLUCOSE 111* 172*  BUN 17 18  CREATININE 1.29* 1.27*  CALCIUM 8.6* 8.4*  MG 1.5*  --    ------------------------------------------------------------------------------------------------------------------  Cardiac Enzymes No results for input(s): TROPONINI in the last 168 hours. ------------------------------------------------------------------------------------------------------------------  RADIOLOGY:  No results found.   ASSESSMENT AND PLAN:   83 year old female with a history of dementia and diabetes who presents with altered mental status.  1. Acute metabolic encephalopathy in the setting of DKA and UTI with underlying dementia  Follow mental status  2. Sepsis: Patient presents with leukocytosis, tachycardia Sepsis is due to UTI  3. DKA: DKA has resolved Continue Lantus and sliding scale   4. UTI: Continue Rocephin and follow-up on final urine culture  5. Dementia: Continue aspiration and fall precautions Seems to be at baseline  6. Hypothyroidism: Continue Synthroid  7. Hyperkalemia: Resolved     CODE STATUS: DNR  TOTAL TIME TAKING CARE OF THIS PATIENT: 30 minutes.     POSSIBLE D/C tomorrow, DEPENDING ON CLINICAL CONDITION.   Ahsley Attwood M.D on 05/28/2017 at 10:41 AM  Between 7am to 6pm - Pager - [phone] After 6pm go to [email or web address] - password [email or web address] [place] [facility]  Office  [phone]  CC: Primary care physician; [name], DO  Note: This dictation was prepared with Dragon dictation along with smaller phrase technology. Any transcriptional errors that result from this process are unintentional.

## 2017-05-28 NOTE — Progress Notes (Signed)
Pharmacy Antibiotic Note  [name] [name] is a 83 y.o. female admitted on 05/27/2017 with UTI. Pharmacy has been consulted for Ceftriaxone dosing.  Plan: Ceftriaxone 1g IV q24h  Height: 5\' 4"  (162.6 cm) Weight: 128 lb (58.1 kg) IBW/kg (Calculated) : 54.7  Temp (24hrs), Avg:98.4 F (36.9 C), Min:97.5 F (36.4 C), Max:99.3 F (37.4 C)   Recent Labs Lab 05/27/17 1800 05/28/17 0035 05/28/17 0521  WBC 13.2*  --   --   CREATININE 2.26* 1.58* 1.29*  LATICACIDVEN 7.4*  --   --     Estimated Creatinine Clearance: 28.5 mL/min (A) (by C-G formula based on SCr of 1.29 mg/dL (H)).      Allergies  Allergen Reactions  . Penicillins Rash    Has patient had a PCN reaction causing immediate rash, facial/tongue/throat swelling, SOB or lightheadedness with hypotension: No Has patient had a PCN reaction causing severe rash involving mucus membranes or skin necrosis: No Has patient had a PCN reaction that required hospitalization No Has patient had a PCN reaction occurring within the last 10 years: No If all of the above answers are "NO", then may proceed with Cephalosporin use.     Antimicrobials this admission: Ceftriaxone 10/31 >>   Microbiology: MRSA PCR: negative BCx: NGTD UCx: sent   Thank you for allowing pharmacy to be a part of this patient's care.  Ulice Dash, PharmD Clinical Pharmacist  05/28/2017 8:41 AM

## 2017-05-28 NOTE — Care Management (Signed)
Patient is open to [facility] home health will go daily to check blood sugars at ALF. Dr. Doy Hutching updated.

## 2017-05-29 LAB — BASIC METABOLIC PANEL
Anion gap: 8 (ref 5–15)
BUN: 12 mg/dL (ref 6–20)
CALCIUM: 8.8 mg/dL — AB (ref 8.9–10.3)
CO2: 19 mmol/L — AB (ref 22–32)
CREATININE: 0.98 mg/dL (ref 0.44–1.00)
Chloride: 112 mmol/L — ABNORMAL HIGH (ref 101–111)
GFR calc Af Amer: >60 mL/min (ref >60)
GFR calc non Af Amer: 52 mL/min — ABNORMAL LOW (ref >60)
GLUCOSE: 139 mg/dL — AB (ref 65–99)
Potassium: 3.4 mmol/L — ABNORMAL LOW (ref 3.5–5.1)
Sodium: 139 mmol/L (ref 135–145)

## 2017-05-29 LAB — CBC WITH DIFFERENTIAL/PLATELET
BASOS PCT: 1 %
Basophils Absolute: 0 10*3/uL (ref 0–0.1)
EOS ABS: 0 10*3/uL (ref 0–0.7)
Eosinophils Relative: 0 %
HEMATOCRIT: 24.9 % — AB (ref 35.0–47.0)
Hemoglobin: 8.3 g/dL — ABNORMAL LOW (ref 12.0–16.0)
Lymphocytes Relative: 15 %
Lymphs Abs: 0.9 10*3/uL — ABNORMAL LOW (ref 1.0–3.6)
MCH: 33 pg (ref 26.0–34.0)
MCHC: 33.4 g/dL (ref 32.0–36.0)
MCV: 99 fL (ref 80.0–100.0)
MONO ABS: 0.4 10*3/uL (ref 0.2–0.9)
MONOS PCT: 7 %
Neutro Abs: 4.9 10*3/uL (ref 1.4–6.5)
Neutrophils Relative %: 77 %
Platelets: 234 10*3/uL (ref 150–440)
RBC: 2.51 MIL/uL — ABNORMAL LOW (ref 3.80–5.20)
RDW: 18.5 % — AB (ref 11.5–14.5)
WBC: 6.3 10*3/uL (ref 3.6–11.0)

## 2017-05-29 LAB — GLUCOSE, CAPILLARY
GLUCOSE-CAPILLARY: 103 mg/dL — AB (ref 65–99)
GLUCOSE-CAPILLARY: 132 mg/dL — AB (ref 65–99)
GLUCOSE-CAPILLARY: 235 mg/dL — AB (ref 65–99)
Glucose-Capillary: 141 mg/dL — ABNORMAL HIGH (ref 65–99)

## 2017-05-29 LAB — URINE CULTURE

## 2017-05-29 MED ORDER — ENSURE ENLIVE PO LIQD
237.0000 mL | Freq: Three times a day (TID) | ORAL | Status: DC
  Administered 2017-05-29: 237 mL via ORAL

## 2017-05-29 MED ORDER — ADULT MULTIVITAMIN LIQUID CH
15.0000 mL | Freq: Every day | ORAL | Status: DC
  Administered 2017-05-29: 15 mL via ORAL
  Filled 2017-05-29: qty 15

## 2017-05-29 MED ORDER — INSULIN ASPART 100 UNIT/ML ~~LOC~~ SOLN: 0.0000 [IU] | Freq: Three times a day (TID) | SUBCUTANEOUS | Status: DC

## 2017-05-29 MED ORDER — OXYCODONE HCL 5 MG PO TABS: 2.5000 mg | ORAL_TABLET | ORAL | 0 refills | Status: AC | PRN

## 2017-05-29 MED ORDER — CEPHALEXIN 500 MG PO CAPS
500.0000 mg | ORAL_CAPSULE | Freq: Two times a day (BID) | ORAL | Status: DC
  Administered 2017-05-29: 500 mg via ORAL
  Filled 2017-05-29: qty 1

## 2017-05-29 MED ORDER — INSULIN LISPRO 100 UNIT/ML ~~LOC~~ SOLN: 0.0000 [IU] | Freq: Three times a day (TID) | SUBCUTANEOUS | 0 refills | Status: AC

## 2017-05-29 MED ORDER — CEPHALEXIN 500 MG PO CAPS: 500.0000 mg | ORAL_CAPSULE | Freq: Two times a day (BID) | ORAL | 0 refills | Status: AC

## 2017-05-29 MED ORDER — INSULIN GLARGINE 100 UNIT/ML ~~LOC~~ SOLN: 15.0000 [IU] | Freq: Every day | SUBCUTANEOUS | Status: DC

## 2017-05-29 MED ORDER — POTASSIUM CHLORIDE 10 MEQ/100ML IV SOLN
10.0000 meq | INTRAVENOUS | Status: AC
  Administered 2017-05-29 (×4): 10 meq via INTRAVENOUS
  Filled 2017-05-29 (×2): qty 100

## 2017-05-29 NOTE — Discharge Summary (Addendum)
[place] at [place] NAME: [name] [name]    MR#:  [id number]  DATE OF BIRTH:  Jan 04, 1932  DATE OF ADMISSION:  05/27/2017 ADMITTING PHYSICIAN: Demetrios Loll, MD  DATE OF DISCHARGE: 05/29/2017  PRIMARY CARE PHYSICIAN: [name], DO    ADMISSION DIAGNOSIS:  Lactic acidosis [E87.2] Acute cystitis without hematuria [N30.00] Diabetic ketoacidosis with coma associated with diabetes mellitus due to underlying condition ([place]) [U44.03] Acute metabolic encephalopathy [K74.25]  DISCHARGE DIAGNOSIS:  Active Problems:   DKA (diabetic ketoacidoses) ([place])   SECONDARY DIAGNOSIS:   Past Medical History:  Diagnosis Date  . Anxiety state, unspecified   . CAD (coronary artery disease)   . Cancer [facility])    2004 Right breast, found on mammogram, radiation therapy, Dr. Bryson Ha, uterine cancer,   . COPD (chronic obstructive pulmonary disease) ([place])   . Diabetes mellitus   . Esophageal reflux   . Heart burn   . Hyperlipidemia   . IBS (irritable bowel syndrome)   . Myocardial infarction [facility])    Cath negative except for 40% occlusion LAD.  Pt not candidate for betablocker or ACEI because of hypotension  . OSA (obstructive sleep apnea)   . Osteoporosis 02/21/09   DEXA scan showed osteoporosis with left femur T-score -2.8.  Marland Kitchen Presbyacusis   . Stroke ([place])   . Thyroid disease    Hypothyroid  . Vitamin D deficiency     HOSPITAL COURSE:  83 year old female with a history of dementia and diabetes who presents with altered mental status.  1. Acute metabolic encephalopathy in the setting of DKA and UTI with underlying dementia  Seems to be at baseline  2. Sepsis: Patient presents with leukocytosis, tachycardia Sepsis is due to UTI  3. DKA: DKA has resolved Continue Lantus and sliding scale   4. UTI: She has been changed to PO kelfex  5. Dementia: Continue aspiration and fall precautions She is at her  baseline  6. Hypothyroidism:  Continue Synthroid  7. Hyperkalemia: Resolved    DISCHARGE CONDITIONS AND DIET:   Stable  Dysphagia 3 diet with thin liquids  CONSULTS OBTAINED:    DRUG ALLERGIES:   Allergies  Allergen Reactions  . Penicillins Rash    Has patient had a PCN reaction causing immediate rash, facial/tongue/throat swelling, SOB or lightheadedness with hypotension: No Has patient had a PCN reaction causing severe rash involving mucus membranes or skin necrosis: No Has patient had a PCN reaction that required hospitalization No Has patient had a PCN reaction occurring within the last 10 years: No If all of the above answers are "NO", then may proceed with Cephalosporin use.     DISCHARGE MEDICATIONS:   Current Discharge Medication List    START taking these medications   Details  cephALEXin (KEFLEX) 500 MG capsule Take 1 capsule (500 mg total) by mouth every 12 (twelve) hours. Qty: 10 capsule, Refills: 0    insulin lispro (HUMALOG) 100 UNIT/ML injection Inject 0-0.15 mLs (0-15 Units total) into the skin 3 (three) times daily with meals. Correction coverage: Moderate (average weight, post-op)  CBG < 70: implement hypoglycemia protocol  CBG 70 - 120: 0 units  CBG 121 - 150: 2 units  CBG 151 - 200: 3 units  CBG 201 - 250: 5 units  CBG 251 - 300: 8 units  CBG 301 - 350: 11 units  CBG 351 - 400: 15 units  CBG > 400 call MD and obtain STAT lab verification Qty: 10 mL, Refills:  0      CONTINUE these medications which have CHANGED   Details  insulin glargine (LANTUS) 100 UNIT/ML injection Inject 0.15 mLs (15 Units total) into the skin at bedtime.    oxyCODONE (ROXICODONE) 5 MG immediate release tablet Take 0.5 tablets (2.5 mg total) by mouth every 4 (four) hours as needed for moderate pain. Qty: 15 tablet, Refills: 0      CONTINUE these medications which have NOT CHANGED   Details  acetaminophen (TYLENOL) 500 MG tablet Take 1,000 mg by mouth every 8 (eight) hours.    aspirin 81 MG  chewable tablet Chew 81 mg by mouth daily.    atorvastatin (LIPITOR) 80 MG tablet Take 1 tablet (80 mg total) by mouth daily. Qty: 90 tablet, Refills: 3    clopidogrel (PLAVIX) 75 MG tablet TAKE 1 TABLET(75 MG) BY MOUTH DAILY Qty: 90 tablet, Refills: 3    fluticasone (FLONASE) 50 MCG/ACT nasal spray Place 2 sprays into both nostrils daily. Qty: 16 g, Refills: 6    levothyroxine (SYNTHROID, LEVOTHROID) 100 MCG tablet TAKE 1 TABLET(100 MCG) BY MOUTH DAILY Qty: 90 tablet, Refills: 3    metoCLOPramide (REGLAN) 5 MG tablet Take 5 mg by mouth 4 (four) times daily -  before meals and at bedtime.     midodrine (PROAMATINE) 10 MG tablet Take 1 tablet (10 mg total) by mouth 3 (three) times daily with meals. Qty: 90 tablet, Refills: 1    ondansetron (ZOFRAN-ODT) 8 MG disintegrating tablet Take 8 mg by mouth every 4 (four) hours as needed for nausea or vomiting.    polyethylene glycol (MIRALAX / GLYCOLAX) packet Take 17 g by mouth daily. Mix 17 grams in 4 to 8 ounces of fluid and give by mouth daily    senna-docusate (SENOKOT-S) 8.6-50 MG tablet Take 2 tablets by mouth daily.    sertraline (ZOLOFT) 25 MG tablet Take 1 tablet (25 mg total) by mouth daily after breakfast. Qty: 30 tablet, Refills: 1      STOP taking these medications     insulin aspart (NOVOLOG) 100 UNIT/ML injection           Today   CHIEF COMPLAINT:  Patient has dementia no acute events overnight   VITAL SIGNS:  Blood pressure 121/63, pulse 77, temperature 98.4 F (36.9 C), temperature source Oral, resp. rate (!) 21, height 5\' 4"  (1.626 m), weight 58.1 kg (128 lb), SpO2 100 %.   REVIEW OF SYSTEMS:  Review of Systems  Unable to perform ROS: Dementia     PHYSICAL EXAMINATION:  GENERAL:  83 y.o.-year-old patient lying in the bed with no acute distress.  NECK:  Supple, no jugular venous distention. No thyroid enlargement, no tenderness.  LUNGS: Normal breath sounds bilaterally, no wheezing, rales,rhonchi  No  use of accessory muscles of respiration.  CARDIOVASCULAR: S1, S2 normal. No murmurs, rubs, or gallops.  ABDOMEN: Soft, non-tender, non-distended. Bowel sounds present. No organomegaly or mass.  EXTREMITIES: No pedal edema, cyanosis, or clubbing.  PSYCHIATRIC: The patient dementia and is in fetal position.  SKIN: She has bruising of lower legs.   DATA REVIEW:   CBC  Recent Labs Lab 05/29/17 0724  WBC 6.3  HGB 8.3*  HCT 24.9*  PLT 234    Chemistries   Recent Labs Lab 05/28/17 0521  05/29/17 0724  NA 141  < > 139  K 3.6  < > 3.4*  CL 114*  < > 112*  CO2 21*  < > 19*  GLUCOSE 111*  < >  139*  BUN 17  < > 12  CREATININE 1.29*  < > 0.98  CALCIUM 8.6*  < > 8.8*  MG 1.5*  --   --   < > = values in this interval not displayed.  Cardiac Enzymes No results for input(s): TROPONINI in the last 168 hours.  Microbiology Results  @MICRORSLT48 @  RADIOLOGY:  No results found.    Current Discharge Medication List    START taking these medications   Details  cephALEXin (KEFLEX) 500 MG capsule Take 1 capsule (500 mg total) by mouth every 12 (twelve) hours. Qty: 10 capsule, Refills: 0    insulin lispro (HUMALOG) 100 UNIT/ML injection Inject 0-0.15 mLs (0-15 Units total) into the skin 3 (three) times daily with meals. Correction coverage: Moderate (average weight, post-op)  CBG < 70: implement hypoglycemia protocol  CBG 70 - 120: 0 units  CBG 121 - 150: 2 units  CBG 151 - 200: 3 units  CBG 201 - 250: 5 units  CBG 251 - 300: 8 units  CBG 301 - 350: 11 units  CBG 351 - 400: 15 units  CBG > 400 call MD and obtain STAT lab verification Qty: 10 mL, Refills: 0      CONTINUE these medications which have CHANGED   Details  insulin glargine (LANTUS) 100 UNIT/ML injection Inject 0.15 mLs (15 Units total) into the skin at bedtime.    oxyCODONE (ROXICODONE) 5 MG immediate release tablet Take 0.5 tablets (2.5 mg total) by mouth every 4 (four) hours as needed for moderate  pain. Qty: 15 tablet, Refills: 0      CONTINUE these medications which have NOT CHANGED   Details  acetaminophen (TYLENOL) 500 MG tablet Take 1,000 mg by mouth every 8 (eight) hours.    aspirin 81 MG chewable tablet Chew 81 mg by mouth daily.    atorvastatin (LIPITOR) 80 MG tablet Take 1 tablet (80 mg total) by mouth daily. Qty: 90 tablet, Refills: 3    clopidogrel (PLAVIX) 75 MG tablet TAKE 1 TABLET(75 MG) BY MOUTH DAILY Qty: 90 tablet, Refills: 3    fluticasone (FLONASE) 50 MCG/ACT nasal spray Place 2 sprays into both nostrils daily. Qty: 16 g, Refills: 6    levothyroxine (SYNTHROID, LEVOTHROID) 100 MCG tablet TAKE 1 TABLET(100 MCG) BY MOUTH DAILY Qty: 90 tablet, Refills: 3    metoCLOPramide (REGLAN) 5 MG tablet Take 5 mg by mouth 4 (four) times daily -  before meals and at bedtime.     midodrine (PROAMATINE) 10 MG tablet Take 1 tablet (10 mg total) by mouth 3 (three) times daily with meals. Qty: 90 tablet, Refills: 1    ondansetron (ZOFRAN-ODT) 8 MG disintegrating tablet Take 8 mg by mouth every 4 (four) hours as needed for nausea or vomiting.    polyethylene glycol (MIRALAX / GLYCOLAX) packet Take 17 g by mouth daily. Mix 17 grams in 4 to 8 ounces of fluid and give by mouth daily    senna-docusate (SENOKOT-S) 8.6-50 MG tablet Take 2 tablets by mouth daily.    sertraline (ZOLOFT) 25 MG tablet Take 1 tablet (25 mg total) by mouth daily after breakfast. Qty: 30 tablet, Refills: 1      STOP taking these medications     insulin aspart (NOVOLOG) 100 UNIT/ML injection             Stable for discharge   Patient should follow up with pcp  CODE STATUS:     Code Status Orders  Start     Ordered   05/27/17 2053  Do not attempt resuscitation (DNR)  Continuous    Question Answer Comment  In the event of cardiac or respiratory ARREST Do not call a "code blue"   In the event of cardiac or respiratory ARREST Do not perform Intubation, CPR, defibrillation or ACLS    In the event of cardiac or respiratory ARREST Use medication by any route, position, wound care, and other measures to relive pain and suffering. May use oxygen, suction and manual treatment of airway obstruction as needed for comfort.      05/27/17 2053    Code Status History    Date Active Date Inactive Code Status Order ID Comments User Context   05/20/2017  5:27 AM 05/21/2017  9:01 PM Full Code [id number]  Harrie Foreman, MD Inpatient   05/05/2017  2:36 PM 05/05/2017  6:40 PM Full Code [id number]  Hessie Knows, MD Inpatient   04/15/2017  9:02 PM 04/21/2017  7:16 PM Full Code [id number]  Henreitta Leber, MD Inpatient   04/05/2017  4:08 PM 04/07/2017  9:19 PM Full Code [id number]  [name], MD Inpatient   02/12/2017 10:42 PM 02/16/2017  6:46 PM Full Code [id number]  Lance Coon, MD Inpatient   01/13/2017  8:22 PM 01/16/2017  7:10 PM Full Code [id number]  Kress, Ubaldo Glassing, DO Inpatient   11/06/2016 11:26 AM 11/15/2016  2:06 AM DNR [id number]  Flora Lipps, MD Inpatient   11/06/2016  3:25 AM 11/06/2016 11:26 AM Full Code [id number]  Saundra Shelling, MD Inpatient   07/15/2016  6:36 PM 07/17/2016 10:11 PM Full Code [id number]  Fritzi Mandes, MD Inpatient   06/08/2015  9:01 PM 06/11/2015  9:20 PM Full Code [id number]  Henreitta Leber, MD Inpatient   06/08/2015  5:16 PM 06/08/2015  9:01 PM Full Code [id number]  Henreitta Leber, MD ED    Advance Directive Documentation     Most Recent Value  Type of Advance Directive  Out of facility DNR (pink MOST or yellow form)  Pre-existing out of facility DNR order (yellow form or pink MOST form)  Physician notified to receive inpatient order  "MOST" Form in Place?  -      TOTAL TIME TAKING CARE OF THIS PATIENT: 37 minutes.    Note: This dictation was prepared with Dragon dictation along with smaller phrase technology. Any transcriptional errors that result from this process are unintentional.  Arleigh Dicola M.D on 05/29/2017 at 10:31 AM  Between 7am to 6pm  - Pager - [phone] After 6pm go to [email or web address] - password [email or web address] [facility]  Office  [phone]  CC: Primary care physician; [name], DO

## 2017-05-29 NOTE — Progress Notes (Signed)
Initial Nutrition Assessment  DOCUMENTATION CODES:   Non-severe (moderate) malnutrition in context of chronic illness  INTERVENTION:   Recommend check Vitamin C levels as pt with bruising over entire body  Recommend check Mg and P levels   Assist with meals  Ensure Enlive po BID, each supplement provides 350 kcal and 20 grams of protein  MVI liquid daily   Magic cup TID with meals, each supplement provides 290 kcal and 9 grams of protein  NUTRITION DIAGNOSIS:   Moderate Malnutrition related to chronic illness (advanced age, COPD, DM) as evidenced by moderate fat depletion in buccal and orbital regions, moderate muscle depletions in clavicles, shoulders, hands and BLE, 9 percent weight loss in 4 months.  GOAL:   Patient will meet greater than or equal to 90% of their needs  MONITOR:   PO intake, Supplement acceptance, Labs, Weight trends, I & O's  REASON FOR ASSESSMENT:   Low Braden    ASSESSMENT:   83 year old female with known history of CAD,COPD,DM,Hyperlipidemia,MI and OSA. Admitted from SNF with DKA,UTI and Acute Renal Failure.   Visited pt's room today. Unable to obtain any nutrition related history from pt as pt with AMS. Spoke to RN, pt eating only bites of food today. RN feels like pt may drink Ensure; RD will order. Pt noted to have bruising over entire body; recommend check vitamin C levels. Pt is on anticoagulants. Pt noted to have low magnesium on 11/1; magnesium was supplemented but not rechecked. Recommend check Mg and P labs prior to pt's discharge. Per chart, pt has lost 12lbs(9%) in 4 months; this is significant. RD suspects pt with poor oral intake pta. Pt with low potassium today; monitor and supplement as needed per MD discretion.    Medications reviewed and include: aspirin, cephalexin, plavix, heparin, insulin, synthroid, senokot, zofran  Labs reviewed: K 3.4(L), Cl 112(H), Ca 8.8(L) Mg 1.5(L)- 11/1 Hgb 8.3(L), Hct 24.9(L) cbgs- 241, 111, 172, 139  x 24 hrs AIC 8.7(H) 11/1  Nutrition-Focused physical exam completed. Findings are moderate fat depletion in buccal and orbital regions, moderate muscle depletions in clavicles, shoulders, hands and BLE (severe depletion noted in calfs) and no edema. Pt noted to having bruising over entire body.   Diet Order:  DIET DYS 3 Room service appropriate? Yes; Fluid consistency: Thin  EDUCATION NEEDS:   Not appropriate for education at this time  Skin: Incision: hand  Last BM:  11/1- type 6  Height:   Ht Readings from Last 1 Encounters:  05/27/17 5\' 4"  (1.626 m)    Weight:   Wt Readings from Last 1 Encounters:  05/27/17 128 lb (58.1 kg)    Ideal Body Weight:  54.5 kg  BMI:  Body mass index is 21.97 kg/m.  Estimated Nutritional Needs:   Kcal:  1400-1600kcal/day   Protein:  64-75g/day   Fluid:  >1.4L/day   Koleen Distance MS, RD, LDN Pager #[phone] After Hours Pager: [phone]

## 2017-05-29 NOTE — NC FL2 (Addendum)
[place] LEVEL OF CARE SCREENING TOOL     IDENTIFICATION  Patient Name: [name] [name] Birthdate: 12/25/1931 Sex: female Admission Date (Current Location): 05/27/2017  [place] and [place] Number:  Engineering geologist and Address:  [facility], [address], [place], [place] [address]      Provider Number: [id number]  Attending Physician Name and Address:  Bettey Costa, MD  Relative Name and Phone Number:       Current Level of Care: Hospital Recommended Level of Care: [place], Memory Care Prior Approval Number:    Date Approved/Denied:   PASRR Number:    Discharge Plan:   ALF/Memory Care   Current Diagnoses: Primary Dx: Alzheimer's Dementia Patient Active Problem List   Diagnosis Date Noted  . C. difficile colitis 05/20/2017  . Hyperglycemia   . Acute kidney injury ([place])   . Metabolic encephalopathy 07/37/1062  . Adjustment disorder with mixed anxiety and depressed mood   . Coffee ground emesis 02/12/2017  . Acute on chronic renal failure ([place]) 01/13/2017  . Dementia, vascular 12/03/2016  . Stroke ([place]) 08/20/2016  . Peripheral vascular disease ([place]) 04/09/2016  . DKA (diabetic ketoacidoses) ([place]) 06/08/2015  . CKD stage 3 due to type 2 diabetes mellitus ([place]) 02/22/2014  . Hypothyroidism 09/23/2012  . GERD (gastroesophageal reflux disease) 09/23/2012  . Anxiety 07/26/2012  . Hyperlipidemia with target LDL less than 70 12/03/2011  . DM (diabetes mellitus) type II uncontrolled, periph vascular disorder ([place]) 04/03/2011  . CAD (coronary artery disease) 12/13/2009    Orientation RESPIRATION BLADDER Height & Weight     Self, Time, Situation, Place  Normal Continent Weight: 128 lb (58.1 kg) Height:  5\' 4"  (162.6 cm)  BEHAVIORAL SYMPTOMS/MOOD NEUROLOGICAL BOWEL NUTRITION STATUS   (none)  (none) Incontinent Diet (dysphagia 3)  AMBULATORY STATUS COMMUNICATION OF NEEDS Skin   Extensive Assist Verbally   (laceration right arm)                       Personal Care Assistance Level of Assistance    Bathing Assistance: Limited assistance Feeding assistance: Limited assistance Dressing Assistance: Limited assistance     Functional Limitations Info    Sight Info: Adequate Hearing Info: Adequate Speech Info: Adequate    SPECIAL CARE FACTORS FREQUENCY  PT (By licensed PT), OT (By licensed OT)                    Contractures Contractures Info: Not present    Additional Factors Info  Code Status Code Status Info: dnr             DISCHARGE MEDICATIONS:       Current Discharge Medication List        START taking these medications   Details  cephALEXin (KEFLEX) 500 MG capsule Take 1 capsule (500 mg total) by mouth every 12 (twelve) hours. Qty: 10 capsule, Refills: 0    insulin lispro (HUMALOG) 100 UNIT/ML injection Inject 0-0.15 mLs (0-15 Units total) into the skin 3 (three) times daily with meals. Correction coverage:   Moderate (average weight, post-op)            CBG < 70:    implement hypoglycemia protocol    CBG 70 - 120:           0 units      CBG 121 - 150:         2 units  CBG 151 - 200:         3 units      CBG 201 - 250:         5 units      CBG 251 - 300:         8 units      CBG 301 - 350:         11 units      CBG 351 - 400:         15 units      CBG > 400   call MD and obtain STAT lab verification Qty: 10 mL, Refills: 0          CONTINUE these medications which have CHANGED   Details  insulin glargine (LANTUS) 100 UNIT/ML injection Inject 0.15 mLs (15 Units total) into the skin at bedtime.    oxyCODONE (ROXICODONE) 5 MG immediate release tablet Take 0.5 tablets (2.5 mg total) by mouth every 4 (four) hours as needed for moderate pain. Qty: 15 tablet, Refills: 0          CONTINUE these medications which have NOT CHANGED   Details  acetaminophen (TYLENOL) 500 MG tablet Take 1,000 mg by mouth every 8 (eight) hours.     aspirin 81 MG chewable tablet Chew 81 mg by mouth daily.    atorvastatin (LIPITOR) 80 MG tablet Take 1 tablet (80 mg total) by mouth daily. Qty: 90 tablet, Refills: 3    clopidogrel (PLAVIX) 75 MG tablet TAKE 1 TABLET(75 MG) BY MOUTH DAILY Qty: 90 tablet, Refills: 3    fluticasone (FLONASE) 50 MCG/ACT nasal spray Place 2 sprays into both nostrils daily. Qty: 16 g, Refills: 6    levothyroxine (SYNTHROID, LEVOTHROID) 100 MCG tablet TAKE 1 TABLET(100 MCG) BY MOUTH DAILY Qty: 90 tablet, Refills: 3    metoCLOPramide (REGLAN) 5 MG tablet Take 5 mg by mouth 4 (four) times daily -  before meals and at bedtime.     midodrine (PROAMATINE) 10 MG tablet Take 1 tablet (10 mg total) by mouth 3 (three) times daily with meals. Qty: 90 tablet, Refills: 1    ondansetron (ZOFRAN-ODT) 8 MG disintegrating tablet Take 8 mg by mouth every 4 (four) hours as needed for nausea or vomiting.    polyethylene glycol (MIRALAX / GLYCOLAX) packet Take 17 g by mouth daily. Mix 17 grams in 4 to 8 ounces of fluid and give by mouth daily    senna-docusate (SENOKOT-S) 8.6-50 MG tablet Take 2 tablets by mouth daily.    sertraline (ZOLOFT) 25 MG tablet Take 1 tablet (25 mg total) by mouth daily after breakfast. Qty: 30 tablet, Refills: 1         STOP taking these medications     insulin aspart (NOVOLOG) 100 UNIT/ML injection           Discharge Medications: Please see discharge summary for a list of discharge medications.  Relevant Imaging Results:  Relevant Lab Results:   Additional Information home health to follow  Shela Leff, LCSW

## 2017-05-29 NOTE — Clinical Social Work Note (Signed)
MD discharging patient today to return to [facility]. CSW notified Thayer Headings at [facility] and they will transport. Patient to have home health. CSW notified patient's daughter of discharge. Shela Leff MSW,LCSW [phone]

## 2017-05-29 NOTE — Progress Notes (Signed)
[place] at [place] NAME: [name] [name]    MR#:  [id number]  DATE OF BIRTH:  1932-05-08  SUBJECTIVE:   No acute events overnight. Patient with dementia. Blood sugars look good this morning.  REVIEW OF SYSTEMS:    Unable to perform due to dementia   Tolerating Diet: yes     DRUG ALLERGIES:   Allergies  Allergen Reactions  . Penicillins Rash    Has patient had a PCN reaction causing immediate rash, facial/tongue/throat swelling, SOB or lightheadedness with hypotension: No Has patient had a PCN reaction causing severe rash involving mucus membranes or skin necrosis: No Has patient had a PCN reaction that required hospitalization No Has patient had a PCN reaction occurring within the last 10 years: No If all of the above answers are "NO", then may proceed with Cephalosporin use.     VITALS:  Blood pressure (!) 104/54, pulse 81, temperature 98.5 F (36.9 C), temperature source Oral, resp. rate 17, height 5\' 4"  (1.626 m), weight 58.1 kg (128 lb), SpO2 99 %.  PHYSICAL EXAMINATION:  Constitutional: lying in bed. No distress. HENT: Normocephalic. Marland Kitchen Oropharynx is clear and moist.  Eyes:  PERRLA, no scleral icterus.  Neck: Normal ROM. Neck supple. No JVD. No tracheal deviation. CVS: RRR, S1/S2 +, no murmurs, no gallops, no carotid bruit.  Pulmonary: Effort and breath sounds normal, no stridor, rhonchi, wheezes, rales.  Abdominal: Soft. BS +,  no distension, tenderness, rebound or guarding.  Musculoskeletal: Normal range of motion. No edema and no tenderness.  Neuro: Alert. CN 2-12 grossly intact. No focal deficits. Skin: she has so many bruises on her legs Psychiatric: Dementia     LABORATORY PANEL:   CBC  Recent Labs Lab 05/29/17 0724  WBC 6.3  HGB 8.3*  HCT 24.9*  PLT 234   ------------------------------------------------------------------------------------------------------------------  Chemistries   Recent  Labs Lab 05/28/17 0521  05/29/17 0724  NA 141  < > 139  K 3.6  < > 3.4*  CL 114*  < > 112*  CO2 21*  < > 19*  GLUCOSE 111*  < > 139*  BUN 17  < > 12  CREATININE 1.29*  < > 0.98  CALCIUM 8.6*  < > 8.8*  MG 1.5*  --   --   < > = values in this interval not displayed. ------------------------------------------------------------------------------------------------------------------  Cardiac Enzymes No results for input(s): TROPONINI in the last 168 hours. ------------------------------------------------------------------------------------------------------------------  RADIOLOGY:  No results found.   ASSESSMENT AND PLAN:   83 year old female with a history of dementia and diabetes who presents with altered mental status.  1. Acute metabolic encephalopathy in the setting of DKA and UTI with underlying dementia  Seems to be at baseline  2. Sepsis: Patient presents with leukocytosis, tachycardia Sepsis is due to UTI  3. DKA: DKA has resolved Continue Lantus and sliding scale   4. UTI: She has been changed to PO kelfex  5. Dementia:Continue aspiration and fall precautions She is at her  baseline  6. Hypothyroidism: Continue Synthroid  7. Hyperkalemia: Resolved  Awaiting physical therapy consultation for discharge planning.     CODE STATUS: DNR  TOTAL TIME TAKING CARE OF THIS PATIENT: 20 minutes.     POSSIBLE D/C today, DEPENDING ON physical therapy consult   Ajooni Karam M.D on 05/29/2017 at 8:46 AM  Between 7am to 6pm - Pager - [phone] After 6pm go to [email or web address] - password [email or web address]  [employer]  [phone]  CC: Primary care physician; [name], DO  Note: This dictation was prepared with Dragon dictation along with smaller phrase technology. Any transcriptional errors that result from this process are unintentional.

## 2017-05-29 NOTE — Progress Notes (Signed)
Discharge order received. Patient is alert and oriented x 1. Vital signs stable . No signs of acute distress. Discharge instructions given to caregiver. Caregiver verbalized understanding. No other issues noted at this time.

## 2017-05-29 NOTE — Care Management (Signed)
Patient to return to [facility].  CSW facilitating.  Patient open with [facility].  Resumption orders have been placed.  [name] [name] with [facility] notified of discharge.

## 2017-05-29 NOTE — Consult Note (Signed)
   [facility] CM Inpatient Consult   05/29/2017  [name] [name] April 03, 1932 [id number]   Patient screened for potential [facility] services. Patient is on the [facility] registry as a benefit of their [employer] Electronic medical record reveals patient's discharge plan is to return to long term care at [facility]. Collaborated with [facility] social worker who previously worked with this patient to confirm patient was not [facility] appropriate at this time. If patient's post hospital needs change please place a [facility] Care Management consult. For questions please contact:   Dale Ribeiro RN, [facility] Liaison  [phone]) Business Mobile [phone]) Toll free office

## 2017-05-30 DIAGNOSIS — M810 Age-related osteoporosis without current pathological fracture: Secondary | ICD-10-CM | POA: Diagnosis not present

## 2017-05-30 DIAGNOSIS — Z853 Personal history of malignant neoplasm of breast: Secondary | ICD-10-CM | POA: Diagnosis not present

## 2017-05-30 DIAGNOSIS — I2510 Atherosclerotic heart disease of native coronary artery without angina pectoris: Secondary | ICD-10-CM | POA: Diagnosis not present

## 2017-05-30 DIAGNOSIS — S52201D Unspecified fracture of shaft of right ulna, subsequent encounter for closed fracture with routine healing: Secondary | ICD-10-CM | POA: Diagnosis not present

## 2017-05-30 DIAGNOSIS — Z794 Long term (current) use of insulin: Secondary | ICD-10-CM | POA: Diagnosis not present

## 2017-05-30 DIAGNOSIS — E1165 Type 2 diabetes mellitus with hyperglycemia: Secondary | ICD-10-CM | POA: Diagnosis not present

## 2017-05-30 DIAGNOSIS — G4730 Sleep apnea, unspecified: Secondary | ICD-10-CM | POA: Diagnosis not present

## 2017-05-30 DIAGNOSIS — I10 Essential (primary) hypertension: Secondary | ICD-10-CM | POA: Diagnosis not present

## 2017-05-30 DIAGNOSIS — J449 Chronic obstructive pulmonary disease, unspecified: Secondary | ICD-10-CM | POA: Diagnosis not present

## 2017-05-30 DIAGNOSIS — Z9181 History of falling: Secondary | ICD-10-CM | POA: Diagnosis not present

## 2017-05-30 LAB — BLOOD CULTURE ID PANEL (REFLEXED)
Acinetobacter baumannii: NOT DETECTED
CANDIDA ALBICANS: NOT DETECTED
CANDIDA PARAPSILOSIS: NOT DETECTED
CANDIDA TROPICALIS: NOT DETECTED
Candida glabrata: NOT DETECTED
Candida krusei: NOT DETECTED
ENTEROBACTER CLOACAE COMPLEX: NOT DETECTED
ENTEROBACTERIACEAE SPECIES: NOT DETECTED
Enterococcus species: NOT DETECTED
Escherichia coli: NOT DETECTED
HAEMOPHILUS INFLUENZAE: NOT DETECTED
KLEBSIELLA PNEUMONIAE: NOT DETECTED
Klebsiella oxytoca: NOT DETECTED
Listeria monocytogenes: NOT DETECTED
Neisseria meningitidis: NOT DETECTED
PROTEUS SPECIES: NOT DETECTED
Pseudomonas aeruginosa: NOT DETECTED
STAPHYLOCOCCUS SPECIES: NOT DETECTED
Serratia marcescens: NOT DETECTED
Staphylococcus aureus (BCID): NOT DETECTED
Streptococcus agalactiae: NOT DETECTED
Streptococcus pneumoniae: NOT DETECTED
Streptococcus pyogenes: NOT DETECTED
Streptococcus species: NOT DETECTED

## 2017-05-31 DIAGNOSIS — I2510 Atherosclerotic heart disease of native coronary artery without angina pectoris: Secondary | ICD-10-CM | POA: Diagnosis not present

## 2017-05-31 DIAGNOSIS — G4730 Sleep apnea, unspecified: Secondary | ICD-10-CM | POA: Diagnosis not present

## 2017-05-31 DIAGNOSIS — I10 Essential (primary) hypertension: Secondary | ICD-10-CM | POA: Diagnosis not present

## 2017-05-31 DIAGNOSIS — E1165 Type 2 diabetes mellitus with hyperglycemia: Secondary | ICD-10-CM | POA: Diagnosis not present

## 2017-05-31 DIAGNOSIS — M810 Age-related osteoporosis without current pathological fracture: Secondary | ICD-10-CM | POA: Diagnosis not present

## 2017-05-31 DIAGNOSIS — J449 Chronic obstructive pulmonary disease, unspecified: Secondary | ICD-10-CM | POA: Diagnosis not present

## 2017-05-31 DIAGNOSIS — Z9181 History of falling: Secondary | ICD-10-CM | POA: Diagnosis not present

## 2017-05-31 DIAGNOSIS — Z853 Personal history of malignant neoplasm of breast: Secondary | ICD-10-CM | POA: Diagnosis not present

## 2017-05-31 DIAGNOSIS — Z794 Long term (current) use of insulin: Secondary | ICD-10-CM | POA: Diagnosis not present

## 2017-05-31 DIAGNOSIS — S52201D Unspecified fracture of shaft of right ulna, subsequent encounter for closed fracture with routine healing: Secondary | ICD-10-CM | POA: Diagnosis not present

## 2017-06-01 LAB — CULTURE, BLOOD (ROUTINE X 2)
Culture: NO GROWTH
SPECIAL REQUESTS: ADEQUATE

## 2017-06-02 DIAGNOSIS — Z794 Long term (current) use of insulin: Secondary | ICD-10-CM | POA: Diagnosis not present

## 2017-06-02 DIAGNOSIS — E785 Hyperlipidemia, unspecified: Secondary | ICD-10-CM | POA: Diagnosis not present

## 2017-06-02 DIAGNOSIS — J449 Chronic obstructive pulmonary disease, unspecified: Secondary | ICD-10-CM | POA: Diagnosis not present

## 2017-06-02 DIAGNOSIS — Z9181 History of falling: Secondary | ICD-10-CM | POA: Diagnosis not present

## 2017-06-02 DIAGNOSIS — S52201D Unspecified fracture of shaft of right ulna, subsequent encounter for closed fracture with routine healing: Secondary | ICD-10-CM | POA: Diagnosis not present

## 2017-06-02 DIAGNOSIS — K219 Gastro-esophageal reflux disease without esophagitis: Secondary | ICD-10-CM | POA: Diagnosis not present

## 2017-06-02 DIAGNOSIS — Z853 Personal history of malignant neoplasm of breast: Secondary | ICD-10-CM | POA: Diagnosis not present

## 2017-06-02 DIAGNOSIS — I10 Essential (primary) hypertension: Secondary | ICD-10-CM | POA: Diagnosis not present

## 2017-06-02 DIAGNOSIS — M810 Age-related osteoporosis without current pathological fracture: Secondary | ICD-10-CM | POA: Diagnosis not present

## 2017-06-02 DIAGNOSIS — I2510 Atherosclerotic heart disease of native coronary artery without angina pectoris: Secondary | ICD-10-CM | POA: Diagnosis not present

## 2017-06-02 DIAGNOSIS — E1165 Type 2 diabetes mellitus with hyperglycemia: Secondary | ICD-10-CM | POA: Diagnosis not present

## 2017-06-02 DIAGNOSIS — G4730 Sleep apnea, unspecified: Secondary | ICD-10-CM | POA: Diagnosis not present

## 2017-06-03 DIAGNOSIS — S52201D Unspecified fracture of shaft of right ulna, subsequent encounter for closed fracture with routine healing: Secondary | ICD-10-CM | POA: Diagnosis not present

## 2017-06-03 DIAGNOSIS — J449 Chronic obstructive pulmonary disease, unspecified: Secondary | ICD-10-CM | POA: Diagnosis not present

## 2017-06-03 DIAGNOSIS — I10 Essential (primary) hypertension: Secondary | ICD-10-CM | POA: Diagnosis not present

## 2017-06-03 DIAGNOSIS — Z9181 History of falling: Secondary | ICD-10-CM | POA: Diagnosis not present

## 2017-06-03 DIAGNOSIS — M810 Age-related osteoporosis without current pathological fracture: Secondary | ICD-10-CM | POA: Diagnosis not present

## 2017-06-03 DIAGNOSIS — Z853 Personal history of malignant neoplasm of breast: Secondary | ICD-10-CM | POA: Diagnosis not present

## 2017-06-03 DIAGNOSIS — G4730 Sleep apnea, unspecified: Secondary | ICD-10-CM | POA: Diagnosis not present

## 2017-06-03 DIAGNOSIS — I2510 Atherosclerotic heart disease of native coronary artery without angina pectoris: Secondary | ICD-10-CM | POA: Diagnosis not present

## 2017-06-03 DIAGNOSIS — Z794 Long term (current) use of insulin: Secondary | ICD-10-CM | POA: Diagnosis not present

## 2017-06-03 DIAGNOSIS — E1165 Type 2 diabetes mellitus with hyperglycemia: Secondary | ICD-10-CM | POA: Diagnosis not present

## 2017-06-04 ENCOUNTER — Inpatient Hospital Stay: Payer: Medicare Other | Admitting: Family

## 2017-06-04 DIAGNOSIS — Z0289 Encounter for other administrative examinations: Secondary | ICD-10-CM

## 2017-06-04 LAB — CULTURE, BLOOD (ROUTINE X 2): SPECIAL REQUESTS: ADEQUATE

## 2017-06-05 DIAGNOSIS — Z9181 History of falling: Secondary | ICD-10-CM | POA: Diagnosis not present

## 2017-06-05 DIAGNOSIS — J449 Chronic obstructive pulmonary disease, unspecified: Secondary | ICD-10-CM | POA: Diagnosis not present

## 2017-06-05 DIAGNOSIS — Z794 Long term (current) use of insulin: Secondary | ICD-10-CM | POA: Diagnosis not present

## 2017-06-05 DIAGNOSIS — M810 Age-related osteoporosis without current pathological fracture: Secondary | ICD-10-CM | POA: Diagnosis not present

## 2017-06-05 DIAGNOSIS — I10 Essential (primary) hypertension: Secondary | ICD-10-CM | POA: Diagnosis not present

## 2017-06-05 DIAGNOSIS — E1165 Type 2 diabetes mellitus with hyperglycemia: Secondary | ICD-10-CM | POA: Diagnosis not present

## 2017-06-05 DIAGNOSIS — R339 Retention of urine, unspecified: Secondary | ICD-10-CM | POA: Diagnosis not present

## 2017-06-05 DIAGNOSIS — N390 Urinary tract infection, site not specified: Secondary | ICD-10-CM | POA: Diagnosis not present

## 2017-06-05 DIAGNOSIS — Z853 Personal history of malignant neoplasm of breast: Secondary | ICD-10-CM | POA: Diagnosis not present

## 2017-06-05 DIAGNOSIS — I2510 Atherosclerotic heart disease of native coronary artery without angina pectoris: Secondary | ICD-10-CM | POA: Diagnosis not present

## 2017-06-05 DIAGNOSIS — S52201D Unspecified fracture of shaft of right ulna, subsequent encounter for closed fracture with routine healing: Secondary | ICD-10-CM | POA: Diagnosis not present

## 2017-06-05 DIAGNOSIS — G4730 Sleep apnea, unspecified: Secondary | ICD-10-CM | POA: Diagnosis not present

## 2017-06-06 ENCOUNTER — Encounter: Payer: Self-pay | Admitting: Emergency Medicine

## 2017-06-06 ENCOUNTER — Inpatient Hospital Stay
Admission: EM | Admit: 2017-06-06 | Discharge: 2017-06-10 | DRG: 638 | Disposition: A | Discharge status: Home / Self Care | Payer: Medicare Other | Attending: Internal Medicine | Admitting: Internal Medicine

## 2017-06-06 ENCOUNTER — Other Ambulatory Visit: Payer: Self-pay

## 2017-06-06 ENCOUNTER — Emergency Department: Payer: Medicare Other

## 2017-06-06 DIAGNOSIS — N183 Chronic kidney disease, stage 3 (moderate): Secondary | ICD-10-CM | POA: Diagnosis not present

## 2017-06-06 DIAGNOSIS — J989 Respiratory disorder, unspecified: Secondary | ICD-10-CM | POA: Diagnosis not present

## 2017-06-06 DIAGNOSIS — E559 Vitamin D deficiency, unspecified: Secondary | ICD-10-CM | POA: Diagnosis not present

## 2017-06-06 DIAGNOSIS — J449 Chronic obstructive pulmonary disease, unspecified: Secondary | ICD-10-CM | POA: Diagnosis present

## 2017-06-06 DIAGNOSIS — N179 Acute kidney failure, unspecified: Secondary | ICD-10-CM | POA: Diagnosis present

## 2017-06-06 DIAGNOSIS — Z794 Long term (current) use of insulin: Secondary | ICD-10-CM

## 2017-06-06 DIAGNOSIS — Z880 Allergy status to penicillin: Secondary | ICD-10-CM

## 2017-06-06 DIAGNOSIS — E1151 Type 2 diabetes mellitus with diabetic peripheral angiopathy without gangrene: Secondary | ICD-10-CM | POA: Diagnosis present

## 2017-06-06 DIAGNOSIS — Z66 Do not resuscitate: Secondary | ICD-10-CM | POA: Diagnosis present

## 2017-06-06 DIAGNOSIS — I129 Hypertensive chronic kidney disease with stage 1 through stage 4 chronic kidney disease, or unspecified chronic kidney disease: Secondary | ICD-10-CM | POA: Diagnosis present

## 2017-06-06 DIAGNOSIS — E1110 Type 2 diabetes mellitus with ketoacidosis without coma: Principal | ICD-10-CM | POA: Diagnosis present

## 2017-06-06 DIAGNOSIS — R1310 Dysphagia, unspecified: Secondary | ICD-10-CM | POA: Diagnosis present

## 2017-06-06 DIAGNOSIS — Z90710 Acquired absence of both cervix and uterus: Secondary | ICD-10-CM

## 2017-06-06 DIAGNOSIS — F0390 Unspecified dementia without behavioral disturbance: Secondary | ICD-10-CM | POA: Diagnosis present

## 2017-06-06 DIAGNOSIS — I2510 Atherosclerotic heart disease of native coronary artery without angina pectoris: Secondary | ICD-10-CM | POA: Diagnosis present

## 2017-06-06 DIAGNOSIS — A419 Sepsis, unspecified organism: Secondary | ICD-10-CM | POA: Diagnosis not present

## 2017-06-06 DIAGNOSIS — R739 Hyperglycemia, unspecified: Secondary | ICD-10-CM | POA: Diagnosis not present

## 2017-06-06 DIAGNOSIS — E785 Hyperlipidemia, unspecified: Secondary | ICD-10-CM | POA: Diagnosis present

## 2017-06-06 DIAGNOSIS — I252 Old myocardial infarction: Secondary | ICD-10-CM

## 2017-06-06 DIAGNOSIS — Z833 Family history of diabetes mellitus: Secondary | ICD-10-CM | POA: Diagnosis not present

## 2017-06-06 DIAGNOSIS — R296 Repeated falls: Secondary | ICD-10-CM | POA: Diagnosis present

## 2017-06-06 DIAGNOSIS — E039 Hypothyroidism, unspecified: Secondary | ICD-10-CM | POA: Diagnosis present

## 2017-06-06 DIAGNOSIS — E860 Dehydration: Secondary | ICD-10-CM | POA: Diagnosis present

## 2017-06-06 DIAGNOSIS — Z8673 Personal history of transient ischemic attack (TIA), and cerebral infarction without residual deficits: Secondary | ICD-10-CM

## 2017-06-06 DIAGNOSIS — G4733 Obstructive sleep apnea (adult) (pediatric): Secondary | ICD-10-CM | POA: Diagnosis present

## 2017-06-06 DIAGNOSIS — Z7401 Bed confinement status: Secondary | ICD-10-CM | POA: Diagnosis not present

## 2017-06-06 DIAGNOSIS — Z8542 Personal history of malignant neoplasm of other parts of uterus: Secondary | ICD-10-CM

## 2017-06-06 DIAGNOSIS — Z7982 Long term (current) use of aspirin: Secondary | ICD-10-CM | POA: Diagnosis not present

## 2017-06-06 DIAGNOSIS — E1310 Other specified diabetes mellitus with ketoacidosis without coma: Secondary | ICD-10-CM | POA: Diagnosis not present

## 2017-06-06 DIAGNOSIS — Z79890 Hormone replacement therapy (postmenopausal): Secondary | ICD-10-CM

## 2017-06-06 DIAGNOSIS — R9431 Abnormal electrocardiogram [ECG] [EKG]: Secondary | ICD-10-CM | POA: Diagnosis not present

## 2017-06-06 DIAGNOSIS — M810 Age-related osteoporosis without current pathological fracture: Secondary | ICD-10-CM | POA: Diagnosis present

## 2017-06-06 DIAGNOSIS — K589 Irritable bowel syndrome without diarrhea: Secondary | ICD-10-CM | POA: Diagnosis present

## 2017-06-06 DIAGNOSIS — R0600 Dyspnea, unspecified: Secondary | ICD-10-CM | POA: Diagnosis not present

## 2017-06-06 DIAGNOSIS — I679 Cerebrovascular disease, unspecified: Secondary | ICD-10-CM | POA: Diagnosis not present

## 2017-06-06 DIAGNOSIS — Z8249 Family history of ischemic heart disease and other diseases of the circulatory system: Secondary | ICD-10-CM

## 2017-06-06 DIAGNOSIS — K219 Gastro-esophageal reflux disease without esophagitis: Secondary | ICD-10-CM | POA: Diagnosis present

## 2017-06-06 DIAGNOSIS — Z7902 Long term (current) use of antithrombotics/antiplatelets: Secondary | ICD-10-CM

## 2017-06-06 DIAGNOSIS — R4182 Altered mental status, unspecified: Secondary | ICD-10-CM | POA: Diagnosis present

## 2017-06-06 DIAGNOSIS — Z7189 Other specified counseling: Secondary | ICD-10-CM | POA: Diagnosis not present

## 2017-06-06 DIAGNOSIS — E1122 Type 2 diabetes mellitus with diabetic chronic kidney disease: Secondary | ICD-10-CM | POA: Diagnosis present

## 2017-06-06 DIAGNOSIS — L8990 Pressure ulcer of unspecified site, unspecified stage: Secondary | ICD-10-CM

## 2017-06-06 HISTORY — DX: Unspecified dementia, unspecified severity, without behavioral disturbance, psychotic disturbance, mood disturbance, and anxiety: F03.90

## 2017-06-06 LAB — URINALYSIS, ROUTINE W REFLEX MICROSCOPIC
Bacteria, UA: NONE SEEN
Bilirubin Urine: NEGATIVE
Ketones, ur: 80 mg/dL — AB
Leukocytes, UA: NEGATIVE
Nitrite: NEGATIVE
PH: 5 (ref 5.0–8.0)
Protein, ur: NEGATIVE mg/dL
SQUAMOUS EPITHELIAL / LPF: NONE SEEN
Specific Gravity, Urine: 1.021 (ref 1.005–1.030)

## 2017-06-06 LAB — BASIC METABOLIC PANEL
ANION GAP: 9 (ref 5–15)
ANION GAP: 9 (ref 5–15)
BUN: 16 mg/dL (ref 6–20)
BUN: 16 mg/dL (ref 6–20)
CALCIUM: 8.3 mg/dL — AB (ref 8.9–10.3)
CALCIUM: 8.4 mg/dL — AB (ref 8.9–10.3)
CO2: 19 mmol/L — AB (ref 22–32)
CO2: 21 mmol/L — ABNORMAL LOW (ref 22–32)
Chloride: 113 mmol/L — ABNORMAL HIGH (ref 101–111)
Chloride: 110 mmol/L (ref 101–111)
Creatinine, Ser: 1.09 mg/dL — ABNORMAL HIGH (ref 0.44–1.00)
Creatinine, Ser: 1.11 mg/dL — ABNORMAL HIGH (ref 0.44–1.00)
GFR calc non Af Amer: 45 mL/min — ABNORMAL LOW (ref >60)
GFR, EST AFRICAN AMERICAN: 53 mL/min — AB (ref >60)
GFR, EST AFRICAN AMERICAN: 52 mL/min — AB (ref >60)
GFR, EST NON AFRICAN AMERICAN: 46 mL/min — AB (ref >60)
Glucose, Bld: 190 mg/dL — ABNORMAL HIGH (ref 65–99)
Glucose, Bld: 98 mg/dL (ref 65–99)
POTASSIUM: 3.3 mmol/L — AB (ref 3.5–5.1)
POTASSIUM: 3 mmol/L — AB (ref 3.5–5.1)
Sodium: 141 mmol/L (ref 135–145)
Sodium: 140 mmol/L (ref 135–145)

## 2017-06-06 LAB — PHOSPHORUS: PHOSPHORUS: 1.4 mg/dL — AB (ref 2.5–4.6)

## 2017-06-06 LAB — BLOOD GAS, VENOUS
PCO2 VEN: 20 mmHg — AB (ref 44.0–60.0)
PO2 VEN: 42 mmHg (ref 32.0–45.0)
Patient temperature: 37
pH, Ven: 7.14 — CL (ref 7.250–7.430)

## 2017-06-06 LAB — GLUCOSE, CAPILLARY
GLUCOSE-CAPILLARY: 548 mg/dL — AB (ref 65–99)
GLUCOSE-CAPILLARY: 301 mg/dL — AB (ref 65–99)
GLUCOSE-CAPILLARY: 134 mg/dL — AB (ref 65–99)
GLUCOSE-CAPILLARY: 90 mg/dL (ref 65–99)
Glucose-Capillary: 470 mg/dL — ABNORMAL HIGH (ref 65–99)
Glucose-Capillary: 403 mg/dL — ABNORMAL HIGH (ref 65–99)
Glucose-Capillary: 216 mg/dL — ABNORMAL HIGH (ref 65–99)
Glucose-Capillary: 184 mg/dL — ABNORMAL HIGH (ref 65–99)
Glucose-Capillary: 93 mg/dL (ref 65–99)

## 2017-06-06 LAB — COMPREHENSIVE METABOLIC PANEL
ALK PHOS: 173 U/L — AB (ref 38–126)
ALT: 20 U/L (ref 14–54)
AST: 39 U/L (ref 15–41)
Albumin: 3.6 g/dL (ref 3.5–5.0)
Anion gap: 29 — ABNORMAL HIGH (ref 5–15)
BUN: 21 mg/dL — ABNORMAL HIGH (ref 6–20)
CALCIUM: 9.3 mg/dL (ref 8.9–10.3)
CO2: 9 mmol/L — AB (ref 22–32)
CREATININE: 2 mg/dL — AB (ref 0.44–1.00)
Chloride: 99 mmol/L — ABNORMAL LOW (ref 101–111)
GFR, EST AFRICAN AMERICAN: 25 mL/min — AB (ref >60)
GFR, EST NON AFRICAN AMERICAN: 22 mL/min — AB (ref >60)
Glucose, Bld: 709 mg/dL (ref 65–99)
Potassium: 5.1 mmol/L (ref 3.5–5.1)
Sodium: 137 mmol/L (ref 135–145)
TOTAL PROTEIN: 7.2 g/dL (ref 6.5–8.1)
Total Bilirubin: 2.6 mg/dL — ABNORMAL HIGH (ref 0.3–1.2)

## 2017-06-06 LAB — MAGNESIUM: MAGNESIUM: 1.6 mg/dL — AB (ref 1.7–2.4)

## 2017-06-06 LAB — CBC
HCT: 31.8 % — ABNORMAL LOW (ref 35.0–47.0)
Hemoglobin: 9.7 g/dL — ABNORMAL LOW (ref 12.0–16.0)
MCH: 33.4 pg (ref 26.0–34.0)
MCHC: 30.7 g/dL — ABNORMAL LOW (ref 32.0–36.0)
MCV: 108.9 fL — ABNORMAL HIGH (ref 80.0–100.0)
PLATELETS: 382 10*3/uL (ref 150–440)
RBC: 2.92 MIL/uL — AB (ref 3.80–5.20)
RDW: 20 % — ABNORMAL HIGH (ref 11.5–14.5)
WBC: 8.6 10*3/uL (ref 3.6–11.0)

## 2017-06-06 LAB — LACTIC ACID, PLASMA
LACTIC ACID, VENOUS: 1.8 mmol/L (ref 0.5–1.9)
Lactic Acid, Venous: 5.2 mmol/L (ref 0.5–1.9)
Lactic Acid, Venous: 6.5 mmol/L (ref 0.5–1.9)

## 2017-06-06 LAB — PROCALCITONIN: Procalcitonin: 0.23 ng/mL

## 2017-06-06 LAB — TROPONIN I: Troponin I: 0.03 ng/mL (ref <0.03)

## 2017-06-06 MED ORDER — ACETAMINOPHEN 325 MG PO TABS: 650.0000 mg | ORAL_TABLET | Freq: Four times a day (QID) | ORAL | Status: DC | PRN

## 2017-06-06 MED ORDER — CHLORHEXIDINE GLUCONATE 0.12 % MT SOLN
15.0000 mL | Freq: Two times a day (BID) | OROMUCOSAL | Status: DC
  Administered 2017-06-06 – 2017-06-10 (×6): 15 mL via OROMUCOSAL
  Filled 2017-06-06 (×6): qty 15

## 2017-06-06 MED ORDER — SODIUM CHLORIDE 0.9 % IV BOLUS (SEPSIS)
1000.0000 mL | Freq: Once | INTRAVENOUS | Status: AC
  Administered 2017-06-06: 1000 mL via INTRAVENOUS

## 2017-06-06 MED ORDER — INSULIN GLARGINE 100 UNIT/ML ~~LOC~~ SOLN
10.0000 [IU] | SUBCUTANEOUS | Status: DC
  Administered 2017-06-06: 10 [IU] via SUBCUTANEOUS
  Filled 2017-06-06 (×2): qty 0.1

## 2017-06-06 MED ORDER — ASPIRIN 81 MG PO CHEW
81.0000 mg | CHEWABLE_TABLET | Freq: Every day | ORAL | Status: DC
  Administered 2017-06-07 – 2017-06-10 (×4): 81 mg via ORAL
  Filled 2017-06-06 (×4): qty 1

## 2017-06-06 MED ORDER — ONDANSETRON HCL 4 MG/2ML IJ SOLN: 4.0000 mg | Freq: Four times a day (QID) | INTRAMUSCULAR | Status: DC | PRN

## 2017-06-06 MED ORDER — VANCOMYCIN HCL IN DEXTROSE 750-5 MG/150ML-% IV SOLN
750.0000 mg | INTRAVENOUS | Status: DC
  Administered 2017-06-06: 750 mg via INTRAVENOUS
  Filled 2017-06-06: qty 150

## 2017-06-06 MED ORDER — LEVOFLOXACIN IN D5W 750 MG/150ML IV SOLN
750.0000 mg | Freq: Once | INTRAVENOUS | Status: AC
Start: 2017-06-06 — End: 2017-06-06
  Administered 2017-06-06: 750 mg via INTRAVENOUS
  Filled 2017-06-06: qty 150

## 2017-06-06 MED ORDER — ACETAMINOPHEN 650 MG RE SUPP: 650.0000 mg | Freq: Four times a day (QID) | RECTAL | Status: DC | PRN

## 2017-06-06 MED ORDER — POLYETHYLENE GLYCOL 3350 17 G PO PACK
17.0000 g | PACK | Freq: Every day | ORAL | Status: DC
  Administered 2017-06-07 – 2017-06-10 (×3): 17 g via ORAL
  Filled 2017-06-06 (×3): qty 1

## 2017-06-06 MED ORDER — VANCOMYCIN HCL IN DEXTROSE 1-5 GM/200ML-% IV SOLN
1000.0000 mg | Freq: Once | INTRAVENOUS | Status: AC
  Administered 2017-06-06: 1000 mg via INTRAVENOUS
  Filled 2017-06-06: qty 200

## 2017-06-06 MED ORDER — SODIUM CHLORIDE 0.9 % IV SOLN: INTRAVENOUS | Status: DC

## 2017-06-06 MED ORDER — ONDANSETRON HCL 4 MG PO TABS: 4.0000 mg | ORAL_TABLET | Freq: Four times a day (QID) | ORAL | Status: DC | PRN

## 2017-06-06 MED ORDER — HEPARIN SODIUM (PORCINE) 5000 UNIT/ML IJ SOLN
5000.0000 [IU] | Freq: Three times a day (TID) | INTRAMUSCULAR | Status: DC
  Administered 2017-06-06 – 2017-06-10 (×12): 5000 [IU] via SUBCUTANEOUS
  Filled 2017-06-06 (×12): qty 1

## 2017-06-06 MED ORDER — SODIUM CHLORIDE 0.9 % IV SOLN
INTRAVENOUS | Status: DC
  Administered 2017-06-06: 14:00:00 via INTRAVENOUS

## 2017-06-06 MED ORDER — SODIUM CHLORIDE 0.9 % IV SOLN
INTRAVENOUS | Status: DC
  Administered 2017-06-06: 4.9 [IU]/h via INTRAVENOUS
  Filled 2017-06-06: qty 1

## 2017-06-06 MED ORDER — KCL IN DEXTROSE-NACL 40-5-0.45 MEQ/L-%-% IV SOLN
INTRAVENOUS | Status: DC
  Administered 2017-06-06 – 2017-06-07 (×3): via INTRAVENOUS
  Filled 2017-06-06 (×5): qty 1000

## 2017-06-06 MED ORDER — SODIUM CHLORIDE 0.9 % IV SOLN
INTRAVENOUS | Status: AC
  Administered 2017-06-06: 13:00:00 via INTRAVENOUS

## 2017-06-06 MED ORDER — FLUTICASONE PROPIONATE 50 MCG/ACT NA SUSP
2.0000 | Freq: Every day | NASAL | Status: DC
  Administered 2017-06-07 – 2017-06-10 (×4): 2 via NASAL
  Filled 2017-06-06: qty 16

## 2017-06-06 MED ORDER — CLOPIDOGREL BISULFATE 75 MG PO TABS
75.0000 mg | ORAL_TABLET | Freq: Every day | ORAL | Status: DC
  Administered 2017-06-07 – 2017-06-10 (×4): 75 mg via ORAL
  Filled 2017-06-06 (×4): qty 1

## 2017-06-06 MED ORDER — SENNOSIDES-DOCUSATE SODIUM 8.6-50 MG PO TABS
2.0000 | ORAL_TABLET | Freq: Every day | ORAL | Status: DC
  Administered 2017-06-07 – 2017-06-10 (×4): 2 via ORAL
  Filled 2017-06-06 (×4): qty 2

## 2017-06-06 MED ORDER — DEXTROSE 5 % IV SOLN
2.0000 g | Freq: Three times a day (TID) | INTRAVENOUS | Status: DC
  Administered 2017-06-06: 2 g via INTRAVENOUS
  Filled 2017-06-06 (×3): qty 2

## 2017-06-06 MED ORDER — MIDODRINE HCL 5 MG PO TABS
10.0000 mg | ORAL_TABLET | Freq: Three times a day (TID) | ORAL | Status: DC
  Administered 2017-06-07 – 2017-06-10 (×8): 10 mg via ORAL
  Filled 2017-06-06 (×11): qty 2

## 2017-06-06 MED ORDER — INSULIN ASPART 100 UNIT/ML ~~LOC~~ SOLN
2.0000 [IU] | SUBCUTANEOUS | Status: DC
  Administered 2017-06-07: 2 [IU] via SUBCUTANEOUS
  Filled 2017-06-06: qty 1

## 2017-06-06 MED ORDER — ORAL CARE MOUTH RINSE
15.0000 mL | Freq: Two times a day (BID) | OROMUCOSAL | Status: DC
  Administered 2017-06-07 – 2017-06-09 (×6): 15 mL via OROMUCOSAL

## 2017-06-06 MED ORDER — DEXTROSE 5 % IV SOLN
2.0000 g | Freq: Once | INTRAVENOUS | Status: AC
  Administered 2017-06-06: 2 g via INTRAVENOUS
  Filled 2017-06-06: qty 2

## 2017-06-06 MED ORDER — ATORVASTATIN CALCIUM 20 MG PO TABS
80.0000 mg | ORAL_TABLET | Freq: Every day | ORAL | Status: DC
  Administered 2017-06-08 – 2017-06-09 (×2): 80 mg via ORAL
  Filled 2017-06-06 (×2): qty 4

## 2017-06-06 MED ORDER — POTASSIUM CHLORIDE 20 MEQ PO PACK: 60.0000 meq | PACK | Freq: Once | ORAL | Status: DC

## 2017-06-06 MED ORDER — LEVOTHYROXINE SODIUM 100 MCG PO TABS
100.0000 ug | ORAL_TABLET | Freq: Every day | ORAL | Status: DC
  Administered 2017-06-07 – 2017-06-09 (×2): 100 ug via ORAL
  Filled 2017-06-06 (×2): qty 1

## 2017-06-06 MED ORDER — DEXTROSE-NACL 5-0.45 % IV SOLN
INTRAVENOUS | Status: DC
  Administered 2017-06-06: 16:00:00 via INTRAVENOUS

## 2017-06-06 MED ORDER — SERTRALINE HCL 50 MG PO TABS
25.0000 mg | ORAL_TABLET | Freq: Every day | ORAL | Status: DC
  Administered 2017-06-07 – 2017-06-10 (×4): 25 mg via ORAL
  Filled 2017-06-06 (×5): qty 1

## 2017-06-06 MED ORDER — HEPARIN SODIUM (PORCINE) 5000 UNIT/ML IJ SOLN: 5000.0000 [IU] | Freq: Three times a day (TID) | INTRAMUSCULAR | Status: DC

## 2017-06-06 MED ORDER — LEVOFLOXACIN IN D5W 750 MG/150ML IV SOLN: 750.0000 mg | INTRAVENOUS | Status: DC

## 2017-06-06 NOTE — Consult Note (Signed)
PULMONARY / CRITICAL CARE MEDICINE   Name: [name] [name] MRN: [id number] DOB: 20-Mar-1932    ADMISSION DATE:  06/06/2017   CONSULTATION DATE: June 06, 2017  REFERRING MD: Dr. Edwina Barth  REASON: DKA and frequent Falls    CHIEF COMPLAINT: Dyspnea  HISTORY OF PRESENT ILLNESS:   This is an 83 year old female who presented to the ED from a SNF for evaluation of respiratory distress and altered mental status.  Patient was also hypoglycemic prior to EMS being called.  At the ED patient was tachycardic, had multiple bruises and had a blood glucose level of 709 mg/dL.  Her lactic acid was 5.2, anion gap of 29 and her creatinine was 2.0.  She is being admitted for DKA.  Patient is confused.  She has a history of dementia at baseline.  Patient has sustained multiple falls over the last couple of weeks and has multiple bruises at different healing stages.   PAST MEDICAL HISTORY :  She  has a past medical history of Anxiety state, unspecified, CAD (coronary artery disease), Cancer ([place]), COPD (chronic obstructive pulmonary disease) ([place]), Dementia, Diabetes mellitus, Esophageal reflux, Heart burn, Hyperlipidemia, IBS (irritable bowel syndrome), Myocardial infarction ([place]), OSA (obstructive sleep apnea), Osteoporosis (02/21/09), Presbyacusis, Stroke [facility]), Thyroid disease, and Vitamin D deficiency.  PAST SURGICAL HISTORY: She  has a past surgical history that includes Abdominal hysterectomy; Breast surgery; Colon surgery (2013); and OPEN REDUCTION INTERNAL FIXATION (ORIF) DISTAL RADIAL FRACTURE (Right, 05/05/2017).  Allergies  Allergen Reactions  . Penicillins Rash    Has patient had a PCN reaction causing immediate rash, facial/tongue/throat swelling, SOB or lightheadedness with hypotension: No Has patient had a PCN reaction causing severe rash involving mucus membranes or skin necrosis: No Has patient had a PCN reaction that required hospitalization No Has patient had a PCN reaction occurring  within the last 10 years: No If all of the above answers are "NO", then may proceed with Cephalosporin use.     No current facility-administered medications on file prior to encounter.    Current Outpatient Medications on File Prior to Encounter  Medication Sig  . acetaminophen (TYLENOL) 500 MG tablet Take 1,000 mg by mouth every 8 (eight) hours.  Marland Kitchen aspirin 81 MG chewable tablet Chew 81 mg by mouth daily.  Marland Kitchen atorvastatin (LIPITOR) 80 MG tablet Take 1 tablet (80 mg total) by mouth daily.  . cephALEXin (KEFLEX) 500 MG capsule Take 500 mg 2 (two) times daily by mouth.  . clopidogrel (PLAVIX) 75 MG tablet TAKE 1 TABLET(75 MG) BY MOUTH DAILY  . fluticasone (FLONASE) 50 MCG/ACT nasal spray Place 2 sprays into both nostrils daily.  . insulin aspart (NOVOLOG) 100 UNIT/ML injection Inject 3 Units 3 (three) times daily before meals into the skin.  Marland Kitchen insulin lispro (HUMALOG) 100 UNIT/ML injection Inject 0-0.15 mLs (0-15 Units total) into the skin 3 (three) times daily with meals. Correction coverage: Moderate (average weight, post-op)  CBG < 70: implement hypoglycemia protocol  CBG 70 - 120: 0 units  CBG 121 - 150: 2 units  CBG 151 - 200: 3 units  CBG 201 - 250: 5 units  CBG 251 - 300: 8 units  CBG 301 - 350: 11 units  CBG 351 - 400: 15 units  CBG > 400 call MD and obtain STAT lab verification  . levothyroxine (SYNTHROID, LEVOTHROID) 100 MCG tablet TAKE 1 TABLET(100 MCG) BY MOUTH DAILY  . midodrine (PROAMATINE) 10 MG tablet Take 1 tablet (10 mg total) by mouth 3 (three) times daily  with meals.  . polyethylene glycol (MIRALAX / GLYCOLAX) packet Take 17 g by mouth daily. Mix 17 grams in 4 to 8 ounces of fluid and give by mouth daily  . senna-docusate (SENOKOT-S) 8.6-50 MG tablet Take 2 tablets by mouth daily.  . sertraline (ZOLOFT) 25 MG tablet Take 1 tablet (25 mg total) by mouth daily after breakfast.  . insulin glargine (LANTUS) 100 UNIT/ML injection Inject 0.15 mLs (15 Units total) into the  skin at bedtime.  . metoCLOPramide (REGLAN) 5 MG tablet Take 5 mg by mouth 4 (four) times daily -  before meals and at bedtime.   . ondansetron (ZOFRAN-ODT) 8 MG disintegrating tablet Take 8 mg by mouth every 4 (four) hours as needed for nausea or vomiting.  Marland Kitchen oxyCODONE (ROXICODONE) 5 MG immediate release tablet Take 0.5 tablets (2.5 mg total) by mouth every 4 (four) hours as needed for moderate pain.    FAMILY HISTORY:  Her indicated that her mother is deceased. She indicated that her father is deceased.   SOCIAL HISTORY: She  reports that  has never smoked. she has never used smokeless tobacco. She reports that she does not drink alcohol or use drugs.  REVIEW OF SYSTEMS:   Unable to obtain as patient is confused and lethargic  SUBJECTIVE:   VITAL SIGNS: BP (!) 109/49   Pulse 91   Temp 98.4 F (36.9 C) (Axillary)   Resp 12   Ht 5\' 4"  (1.626 m)   Wt 128 lb 1.4 oz (58.1 kg)   SpO2 98%   BMI 21.99 kg/m   HEMODYNAMICS:    VENTILATOR SETTINGS:    INTAKE / OUTPUT: I/O last 3 completed shifts: In: 1799.4 [I.V.:499.4; IV Piggyback:1300] Out: -   PHYSICAL EXAMINATION: General: No acute distress Neuro:  Awake, confused, unable to follow basic commands HEENT:  PERRLA, trachea midline, no JVD Cardiovascular:  Apical pulse regular, S1, S2, no murmur, regurg or color, no edema, +2 pulses Lungs:  Bilateral breath sounds, diminished in the bases, no wheezing Abdomen:  Nondistended, normal bowel sounds in all 4 quadrants Musculoskeletal:  No joint deformities, no swelling Skin: Warm and dry  LABS:  BMET Recent Labs  Lab 06/06/17 0908 06/06/17 1620  NA 137 141  K 5.1 3.3*  CL 99* 113*  CO2 9* 19*  BUN 21* 16  CREATININE 2.00* 1.09*  GLUCOSE 709* 190*    Electrolytes Recent Labs  Lab 06/06/17 0908 06/06/17 1620  CALCIUM 9.3 8.3*    CBC Recent Labs  Lab 06/06/17 0908  WBC 8.6  HGB 9.7*  HCT 31.8*  PLT 382    Coag's No results for input(s): APTT, INR  in the last 168 hours.  Sepsis Markers Recent Labs  Lab 06/06/17 0908 06/06/17 1239  LATICACIDVEN 5.2* 6.5*    ABG No results for input(s): PHART, PCO2ART, PO2ART in the last 168 hours.  Liver Enzymes Recent Labs  Lab 06/06/17 0908  AST 39  ALT 20  ALKPHOS 173*  BILITOT 2.6*  ALBUMIN 3.6    Cardiac Enzymes Recent Labs  Lab 06/06/17 0908  TROPONINI 0.03*    Glucose Recent Labs  Lab 06/06/17 1332 06/06/17 1434 06/06/17 1530 06/06/17 1644 06/06/17 1746 06/06/17 1849  GLUCAP 403* 301* 216* 184* 134* 90    Imaging Dg Chest Port 1 View  Result Date: 06/06/2017 CLINICAL DATA:  Respiratory difficulty. EXAM: PORTABLE CHEST 1 VIEW COMPARISON:  05/19/2017 FINDINGS: Cardiac silhouette is normal in size. No mediastinal or hilar masses. No evidence of adenopathy.  Lungs are hyperexpanded but clear. No pleural effusion or pneumothorax. Skeletal structures are demineralized but grossly intact. IMPRESSION: No acute cardiopulmonary disease. Electronically Signed   By: Lajean Manes M.D.   On: 06/06/2017 09:49   STUDIES:  Last echo was in April 2018 and showed an EF of 50-55%  CULTURES: , Blood cultures 2 Urine culture  ANTIBIOTICS: Aztreonam  Vancomycin Levofloxacin SIGNIFICANT EVENTS: 06/06/2017: Admitted  LINES/TUBES: Peripheral IVs  DISCUSSION: This is an 82 year old female presenting with diabetic ketoacidosis, severe lactic acidosis multiple falls and acute renal failure. She was febrile upon arrival in the ED, but this might just be due to dehydration. She has no white count and she is now febrile  ASSESSMENT DKA Severe lactic acidosis Multiple falls Severe dehydration Acute renal failure Uncontrolled T2DM-Last hemoglobin AIC on 05/2017 8.7% History of:  Dementia, COPD, CAD, OSA, CVA and breast cancer  PLAN Hemodynamics per ICU protocol DKA protocol   IV fluids Trend BMP and correct electrolyte imbalances Discontinue antibiotics as pro-calcitonin  level is normal and patient is a febrile, with no elevated WBC. Continue all home medications Keep patient nothing by mouth until mental status improves. GI and DVT prophylaxis  FAMILY  - Updates: No family at bedside. We'll updated when available  - Inter-disciplinary family meet or Palliative Care meeting due by:  day [place]. [facility] ANP-BC Pulmonary and Critical Care Medicine [facility] Pager [phone] or [phone]  06/06/2017, 7:34 PM

## 2017-06-06 NOTE — Progress Notes (Signed)
Pharmacy Antibiotic Note  [name] [name] is a 83 y.o. female admitted on 06/06/2017 with pneumonia and sepsis.  Pharmacy has been consulted for aztreonam, levofloxacin, vancomycin dosing.  Plan: Vancomycin 750mg   IV every 24 hours.  Goal trough 15-20 mcg/mL. aztreonam 2gm iv q8h, levofloxacin 750mg  iv q48h  Weight: 128 lb (58.1 kg)  Temp (24hrs), Avg:100.1 F (37.8 C), Min:100.1 F (37.8 C), Max:100.1 F (37.8 C)  Recent Labs  Lab 06/06/17 0908  WBC 8.6  CREATININE 2.00*  LATICACIDVEN 5.2*    Estimated Creatinine Clearance: 18.4 mL/min (A) (by C-G formula based on SCr of 2 mg/dL (H)).    Allergies  Allergen Reactions  . Penicillins Rash    Has patient had a PCN reaction causing immediate rash, facial/tongue/throat swelling, SOB or lightheadedness with hypotension: No Has patient had a PCN reaction causing severe rash involving mucus membranes or skin necrosis: No Has patient had a PCN reaction that required hospitalization No Has patient had a PCN reaction occurring within the last 10 years: No If all of the above answers are "NO", then may proceed with Cephalosporin use.     Antimicrobials this admission: Anti-infectives (From admission, onward)   Start     Dose/Rate Route Frequency Ordered Stop   06/08/17 1000  levofloxacin (LEVAQUIN) IVPB 750 mg     750 mg 100 mL/hr over 90 Minutes Intravenous Every 48 hours 06/06/17 1029     06/06/17 2100  vancomycin (VANCOCIN) IVPB 750 mg/150 ml premix     750 mg 150 mL/hr over 60 Minutes Intravenous Every 24 hours 06/06/17 1031     06/06/17 1800  aztreonam (AZACTAM) 2 g in dextrose 5 % 50 mL IVPB     2 g 100 mL/hr over 30 Minutes Intravenous Every 8 hours 06/06/17 1033     06/06/17 1000  levofloxacin (LEVAQUIN) IVPB 750 mg     750 mg 100 mL/hr over 90 Minutes Intravenous  Once 06/06/17 0958     06/06/17 1000  aztreonam (AZACTAM) 2 g in dextrose 5 % 50 mL IVPB     2 g 100 mL/hr over 30 Minutes Intravenous  Once 06/06/17 0958      06/06/17 1000  vancomycin (VANCOCIN) IVPB 1000 mg/200 mL premix     1,000 mg 200 mL/hr over 60 Minutes Intravenous  Once 06/06/17 2706       Microbiology results: Recent Results (from the past 240 hour(s))  Urine culture     Status: Abnormal   Collection Time: 05/27/17  6:00 PM  Result Value Ref Range Status   Specimen Description URINE, RANDOM  Final   Special Requests NONE  Final   Culture MULTIPLE SPECIES PRESENT, SUGGEST RECOLLECTION (A)  Final   Report Status 05/29/2017 FINAL  Final  Blood culture (routine x 2)     Status: None   Collection Time: 05/27/17  7:12 PM  Result Value Ref Range Status   Specimen Description BLOOD RIGHT ARM  Final   Special Requests   Final    BOTTLES DRAWN AEROBIC AND ANAEROBIC Blood Culture adequate volume   Culture NO GROWTH 5 DAYS  Final   Report Status 06/01/2017 FINAL  Final  Blood culture (routine x 2)     Status: Abnormal   Collection Time: 05/27/17  7:16 PM  Result Value Ref Range Status   Specimen Description BLOOD RIGHT ANTECUBITAL  Final   Special Requests   Final    BOTTLES DRAWN AEROBIC AND ANAEROBIC Blood Culture adequate volume   Culture  Setup Time   Final    GRAM POSITIVE RODS ANAEROBIC BOTTLE ONLY CRITICAL RESULT CALLED TO, READ BACK BY AND VERIFIED WITH: MATT MCBANE AT [place] ON 05/30/17 RWW    Culture (A)  Final    ANAEROBIC GRAM POSITIVE RODS UNABLE TO ID ANY FURTHER Performed at [facility], [place] [address] [place], [place] [address]    Report Status 06/04/2017 FINAL  Final  Blood Culture ID Panel (Reflexed)     Status: None   Collection Time: 05/27/17  7:16 PM  Result Value Ref Range Status   Enterococcus species NOT DETECTED NOT DETECTED Final   Listeria monocytogenes NOT DETECTED NOT DETECTED Final   Staphylococcus species NOT DETECTED NOT DETECTED Final   Staphylococcus aureus NOT DETECTED NOT DETECTED Final   Streptococcus species NOT DETECTED NOT DETECTED Final   Streptococcus agalactiae NOT DETECTED  NOT DETECTED Final   Streptococcus pneumoniae NOT DETECTED NOT DETECTED Final   Streptococcus pyogenes NOT DETECTED NOT DETECTED Final   Acinetobacter baumannii NOT DETECTED NOT DETECTED Final   Enterobacteriaceae species NOT DETECTED NOT DETECTED Final   Enterobacter cloacae complex NOT DETECTED NOT DETECTED Final   Escherichia coli NOT DETECTED NOT DETECTED Final   Klebsiella oxytoca NOT DETECTED NOT DETECTED Final   Klebsiella pneumoniae NOT DETECTED NOT DETECTED Final   Proteus species NOT DETECTED NOT DETECTED Final   Serratia marcescens NOT DETECTED NOT DETECTED Final   Haemophilus influenzae NOT DETECTED NOT DETECTED Final   Neisseria meningitidis NOT DETECTED NOT DETECTED Final   Pseudomonas aeruginosa NOT DETECTED NOT DETECTED Final   Candida albicans NOT DETECTED NOT DETECTED Final   Candida glabrata NOT DETECTED NOT DETECTED Final   Candida krusei NOT DETECTED NOT DETECTED Final   Candida parapsilosis NOT DETECTED NOT DETECTED Final   Candida tropicalis NOT DETECTED NOT DETECTED Final  MRSA PCR Screening     Status: None   Collection Time: 05/27/17  8:48 PM  Result Value Ref Range Status   MRSA by PCR NEGATIVE NEGATIVE Final    Comment:        The GeneXpert MRSA Assay (FDA approved for NASAL specimens only), is one component of a comprehensive MRSA colonization surveillance program. It is not intended to diagnose MRSA infection nor to guide or monitor treatment for MRSA infections.      Thank you for allowing pharmacy to be a part of this patient's care.  Donna Christen Enjoli Tidd 06/06/2017 10:33 AM

## 2017-06-06 NOTE — Clinical Social Work Note (Addendum)
Clinical Social Work Assessment  Patient Details  Name: [name] [name] MRN: [id number] Date of Birth: April 09, 1932  Date of referral:  06/06/17               Reason for consult:  Frequent Admissions / ED Visits                Permission sought to share information with:  Family Supports, Facility Sport and exercise psychologist Permission granted to share information::  Yes, Verbal Permission Granted  Name::      Loyal Jacobson [phone]  Agency::   [facility]   Relationship::     Contact Information:     Housing/Transportation Living arrangements for the past 2 months:  Skilled Nursing Facility([facility]) Source of Information:  Adult Children Patient Interpreter Needed:  None Criminal Activity/Legal Involvement Pertinent to Current Situation/Hospitalization:  No - Comment as needed Significant Relationships:  Adult Children Lives with:  Facility Resident Do you feel safe going back to the place where you live?   Yes Need for family participation in patient care:  Yes (Comment)  Care giving concerns: Daughter worried that her diabetes is really acting up these days   Social Worker assessment / plan: LCSW introduced myself to patient( Non verbal and Dementia) and her daughter [name] Associate Professor) verbal consent was given to speak to family and facility ( [facility])This 83 year old female who has a history of diabetes and a recent admission for diabetic ketoacidosis. She was not acting right today was brought in found to be in DKA again. She's had multiple falls as bruising in multiple places. Also has a wrist fracture and right forearm previous fall.   Daughter reports her Mom has been at [facility] for 1 month and will be returning there once discharged. This 83 year old female is widowed and has 3 sons and 1 daughter. She has dementia and is non verbal and oriented x1 today. She requires full assistance with all her ADL's and assistance with eating. Patient is incontinent. She is diabetic  . At [facility] patient has PT, OT and speech therapy but has not been well enough to partake this last week. Patient is Medicare/United health care.  Employment status:  Retired(Worked in [employer] fundraiser) Insurance information:  Medicare(/Medicare [employer] care) PT Recommendations:  Not assessed at this time Information / Referral to community resources:      Patient/Family's Response to care:  Patients daughter is concerned that her diabetes is really affecting her health in negative ways.  Patient/Family's Understanding of and Emotional Response to Diagnosis, Current Treatment, and Prognosis:  Daughter has good understanding  Emotional Assessment Appearance:  Appears stated age Attitude/Demeanor/Rapport:  Unable to Assess Affect (typically observed):  Unable to Assess Orientation:  Oriented to Self, Fluctuating Orientation (Suspected and/or reported Sundowners) Alcohol / Substance use:  Not Applicable Psych involvement (Current and /or in the community):  No (Comment)  Discharge Needs  Concerns to be addressed:  Care Coordination Readmission within the last 30 days:  Yes Current discharge risk:  None Barriers to Discharge:  No Barriers Identified, Continued Medical Work up   [place], [place], LCSW 06/06/2017, 11:23 AM

## 2017-06-06 NOTE — ED Notes (Signed)
RN called pharmacy who reported it was not safe to run Aztreonam 2g at the same time as Vancomycin.

## 2017-06-06 NOTE — NC FL2 (Addendum)
[place] LEVEL OF CARE SCREENING TOOL     IDENTIFICATION  Patient Name: [name] [name] Birthdate: 09-Jan-1932 Sex: female Admission Date (Current Location): 06/06/2017  [place] and [place] Number:  Engineering geologist and Address:  [facility], [address] [place], [place] [address]      Provider Number: [id number]  Attending Physician Name and Address:  Baxter Hire, MD  Relative Name and Phone Number:       Current Level of Care: Hospital Recommended Level of Care: [place], Memory Care Prior Approval Number:    Date Approved/Denied:   PASRR Number:    Discharge Plan: SNF    Current Diagnoses: Patient Active Problem List   Diagnosis Date Noted  . C. difficile colitis 05/20/2017  . Hyperglycemia   . Acute kidney injury ([place])   . Metabolic encephalopathy 08/65/7846  . Adjustment disorder with mixed anxiety and depressed mood   . Coffee ground emesis 02/12/2017  . Acute on chronic renal failure ([place]) 01/13/2017  . Dementia, vascular 12/03/2016  . Stroke ([place]) 08/20/2016  . Peripheral vascular disease ([place]) 04/09/2016  . DKA (diabetic ketoacidoses) ([place]) 06/08/2015  . CKD stage 3 due to type 2 diabetes mellitus ([place]) 02/22/2014  . Hypothyroidism 09/23/2012  . GERD (gastroesophageal reflux disease) 09/23/2012  . Anxiety 07/26/2012  . Hyperlipidemia with target LDL less than 70 12/03/2011  . DM (diabetes mellitus) type II uncontrolled, periph vascular disorder ([place]) 04/03/2011  . CAD (coronary artery disease) 12/13/2009    Orientation RESPIRATION BLADDER Height & Weight     Self  Normal Incontinent Weight: 128 lb (58.1 kg) Height:     BEHAVIORAL SYMPTOMS/MOOD NEUROLOGICAL BOWEL NUTRITION STATUS      Incontinent Diet(Dysphagia 3)  AMBULATORY STATUS COMMUNICATION OF NEEDS Skin   Extensive Assist Verbally Bruising                       Personal Care Assistance Level of Assistance   Bathing, Feeding, Dressing, Total care Bathing Assistance: Limited assistance Feeding assistance: Limited assistance Dressing Assistance: Limited assistance Total Care Assistance: Limited assistance   Functional Limitations Info  Sight, Hearing, Speech Sight Info: Adequate Hearing Info: Adequate Speech Info: Impaired    SPECIAL CARE FACTORS FREQUENCY  PT (By licensed PT), OT (By licensed OT)    Speech Therapy 1x week PT Frequency: 5x OT Frequency: 5x            Contractures Contractures Info: Not present    Additional Factors Info  Allergies   Allergies Info: Penicillians           Current Medications (06/06/2017):  This is the current hospital active medication list Current Facility-Administered Medications  Medication Dose Route Frequency Provider Last Rate Last Dose  . aztreonam (AZACTAM) 2 g in dextrose 5 % 50 mL IVPB  2 g Intravenous Once Orbie Pyo, MD      . aztreonam (AZACTAM) 2 g in dextrose 5 % 50 mL IVPB  2 g Intravenous Q8H Coffee, Donna Christen, [facility]      . insulin regular (NOVOLIN R,HUMULIN R) 100 Units in sodium chloride 0.9 % 100 mL (1 Units/mL) infusion   Intravenous Continuous Orbie Pyo, MD 4.9 mL/hr at 06/06/17 1110 4.9 Units/hr at 06/06/17 1110  . levofloxacin (LEVAQUIN) IVPB 750 mg  750 mg Intravenous Once Orbie Pyo, MD 100 mL/hr at 06/06/17 1022 750 mg at 06/06/17 1022  . [START ON 06/08/2017] levofloxacin (LEVAQUIN) IVPB 750  mg  750 mg Intravenous Q48H Coffee, Donna Christen, [facility]      . vancomycin (VANCOCIN) IVPB 1000 mg/200 mL premix  1,000 mg Intravenous Once Orbie Pyo, MD      . vancomycin (VANCOCIN) IVPB 750 mg/150 ml premix  750 mg Intravenous Q24H Coffee, Donna Christen, [facility]       Current Outpatient Medications  Medication Sig Dispense Refill  . acetaminophen (TYLENOL) 500 MG tablet Take 1,000 mg by mouth every 8 (eight) hours.    Marland Kitchen aspirin 81 MG chewable tablet Chew 81 mg by mouth daily.    Marland Kitchen  atorvastatin (LIPITOR) 80 MG tablet Take 1 tablet (80 mg total) by mouth daily. 90 tablet 3  . cephALEXin (KEFLEX) 500 MG capsule Take 500 mg 2 (two) times daily by mouth.    . clopidogrel (PLAVIX) 75 MG tablet TAKE 1 TABLET(75 MG) BY MOUTH DAILY 90 tablet 3  . fluticasone (FLONASE) 50 MCG/ACT nasal spray Place 2 sprays into both nostrils daily. 16 g 6  . insulin aspart (NOVOLOG) 100 UNIT/ML injection Inject 3 Units 3 (three) times daily before meals into the skin.    Marland Kitchen insulin lispro (HUMALOG) 100 UNIT/ML injection Inject 0-0.15 mLs (0-15 Units total) into the skin 3 (three) times daily with meals. Correction coverage: Moderate (average weight, post-op)  CBG < 70: implement hypoglycemia protocol  CBG 70 - 120: 0 units  CBG 121 - 150: 2 units  CBG 151 - 200: 3 units  CBG 201 - 250: 5 units  CBG 251 - 300: 8 units  CBG 301 - 350: 11 units  CBG 351 - 400: 15 units  CBG > 400 call MD and obtain STAT lab verification 10 mL 0  . levothyroxine (SYNTHROID, LEVOTHROID) 100 MCG tablet TAKE 1 TABLET(100 MCG) BY MOUTH DAILY 90 tablet 3  . midodrine (PROAMATINE) 10 MG tablet Take 1 tablet (10 mg total) by mouth 3 (three) times daily with meals. 90 tablet 1  . polyethylene glycol (MIRALAX / GLYCOLAX) packet Take 17 g by mouth daily. Mix 17 grams in 4 to 8 ounces of fluid and give by mouth daily    . senna-docusate (SENOKOT-S) 8.6-50 MG tablet Take 2 tablets by mouth daily.    . sertraline (ZOLOFT) 25 MG tablet Take 1 tablet (25 mg total) by mouth daily after breakfast. 30 tablet 1  . insulin glargine (LANTUS) 100 UNIT/ML injection Inject 0.15 mLs (15 Units total) into the skin at bedtime.    . metoCLOPramide (REGLAN) 5 MG tablet Take 5 mg by mouth 4 (four) times daily -  before meals and at bedtime.     . ondansetron (ZOFRAN-ODT) 8 MG disintegrating tablet Take 8 mg by mouth every 4 (four) hours as needed for nausea or vomiting.    Marland Kitchen oxyCODONE (ROXICODONE) 5 MG immediate release tablet Take 0.5 tablets  (2.5 mg total) by mouth every 4 (four) hours as needed for moderate pain. 15 tablet 0     Discharge Medications: Please see discharge summary for a list of discharge medications.  Relevant Imaging Results:  Relevant Lab Results:   Additional Information SSN [id number]  Joana Reamer, [place]

## 2017-06-06 NOTE — ED Provider Notes (Addendum)
[facility] Emergency Department Provider Note  ___________________________________________   First MD Initiated Contact with Patient 06/06/17 [phone]     (approximate)  I have reviewed the triage vital signs and the nursing notes.   HISTORY  Chief Complaint Altered Mental Status and Hyperglycemia   HPI [name] [name] is a 83 y.o. female with a history of CAD, dementia, diabetes and anxiety who is presenting to the emergency department today with reported shortness of breath after spitting prior to arrival.  EMS also reported a glucose of 516 and had a temperature taken of 100.7.  Patient is minimally verbal and with dementia.  However EMS says that her mentation today is decreased from her baseline according to staff at the skilled nursing facility.  EMS also reports that the patient has received insulin.  Patient unable to verbalize any complaints at this time.   Past Medical History:  Diagnosis Date  . Anxiety state, unspecified   . CAD (coronary artery disease)   . Cancer [facility])    2004 Right breast, found on mammogram, radiation therapy, Dr. Bryson Ha, uterine cancer,   . COPD (chronic obstructive pulmonary disease) ([place])   . Dementia   . Diabetes mellitus   . Esophageal reflux   . Heart burn   . Hyperlipidemia   . IBS (irritable bowel syndrome)   . Myocardial infarction [facility])    Cath negative except for 40% occlusion LAD.  Pt not candidate for betablocker or ACEI because of hypotension  . OSA (obstructive sleep apnea)   . Osteoporosis 02/21/09   DEXA scan showed osteoporosis with left femur T-score -2.8.  Marland Kitchen Presbyacusis   . Stroke ([place])   . Thyroid disease    Hypothyroid  . Vitamin D deficiency     Patient Active Problem List   Diagnosis Date Noted  . C. difficile colitis 05/20/2017  . Hyperglycemia   . Acute kidney injury ([place])   . Metabolic encephalopathy 93/71/6967  . Adjustment disorder with mixed anxiety and depressed mood   . Coffee  ground emesis 02/12/2017  . Acute on chronic renal failure ([place]) 01/13/2017  . Dementia, vascular 12/03/2016  . Stroke ([place]) 08/20/2016  . Peripheral vascular disease ([place]) 04/09/2016  . DKA (diabetic ketoacidoses) ([place]) 06/08/2015  . CKD stage 3 due to type 2 diabetes mellitus ([place]) 02/22/2014  . Hypothyroidism 09/23/2012  . GERD (gastroesophageal reflux disease) 09/23/2012  . Anxiety 07/26/2012  . Hyperlipidemia with target LDL less than 70 12/03/2011  . DM (diabetes mellitus) type II uncontrolled, periph vascular disorder ([place]) 04/03/2011  . CAD (coronary artery disease) 12/13/2009    Past Surgical History:  Procedure Laterality Date  . ABDOMINAL HYSTERECTOMY     uterine cancer  . BREAST SURGERY    . COLON SURGERY  2013   done at [facility]    Prior to Admission medications   Medication Sig Start Date End Date Taking? Authorizing Provider  acetaminophen (TYLENOL) 500 MG tablet Take 1,000 mg by mouth every 8 (eight) hours.   Yes [provider]  aspirin 81 MG chewable tablet Chew 81 mg by mouth daily.   Yes [provider]  atorvastatin (LIPITOR) 80 MG tablet Take 1 tablet (80 mg total) by mouth daily. 08/21/16  Yes Cook, Jayce G, DO  cephALEXin (KEFLEX) 500 MG capsule Take 500 mg 2 (two) times daily by mouth.   Yes [provider]  clopidogrel (PLAVIX) 75 MG tablet TAKE 1 TABLET(75 MG) BY MOUTH DAILY 01/01/17  Yes Thersa Salt  G, DO  fluticasone (FLONASE) 50 MCG/ACT nasal spray Place 2 sprays into both nostrils daily. 01/01/17  Yes Cook, Jayce G, DO  insulin aspart (NOVOLOG) 100 UNIT/ML injection Inject 3 Units 3 (three) times daily before meals into the skin.   Yes [provider]  insulin lispro (HUMALOG) 100 UNIT/ML injection Inject 0-0.15 mLs (0-15 Units total) into the skin 3 (three) times daily with meals. Correction coverage: Moderate (average weight, post-op)  CBG < 70: implement hypoglycemia protocol  CBG 70 - 120: 0 units  CBG 121 - 150: 2  units  CBG 151 - 200: 3 units  CBG 201 - 250: 5 units  CBG 251 - 300: 8 units  CBG 301 - 350: 11 units  CBG 351 - 400: 15 units  CBG > 400 call MD and obtain STAT lab verification 05/29/17  Yes Mody, Sital, MD  levothyroxine (SYNTHROID, LEVOTHROID) 100 MCG tablet TAKE 1 TABLET(100 MCG) BY MOUTH DAILY 01/01/17  Yes Cook, Jayce G, DO  midodrine (PROAMATINE) 10 MG tablet Take 1 tablet (10 mg total) by mouth 3 (three) times daily with meals. 01/30/17  Yes Cook, Jayce G, DO  polyethylene glycol (MIRALAX / GLYCOLAX) packet Take 17 g by mouth daily. Mix 17 grams in 4 to 8 ounces of fluid and give by mouth daily   Yes [provider]  senna-docusate (SENOKOT-S) 8.6-50 MG tablet Take 2 tablets by mouth daily.   Yes [provider]  sertraline (ZOLOFT) 25 MG tablet Take 1 tablet (25 mg total) by mouth daily after breakfast. 02/17/17  Yes Fritzi Mandes, MD  insulin glargine (LANTUS) 100 UNIT/ML injection Inject 0.15 mLs (15 Units total) into the skin at bedtime. 05/29/17   Bettey Costa, MD  metoCLOPramide (REGLAN) 5 MG tablet Take 5 mg by mouth 4 (four) times daily -  before meals and at bedtime.     [provider]  ondansetron (ZOFRAN-ODT) 8 MG disintegrating tablet Take 8 mg by mouth every 4 (four) hours as needed for nausea or vomiting.    [provider]  oxyCODONE (ROXICODONE) 5 MG immediate release tablet Take 0.5 tablets (2.5 mg total) by mouth every 4 (four) hours as needed for moderate pain. 05/29/17 05/29/18  Bettey Costa, MD    Allergies Penicillins  Family History  Problem Relation Age of Onset  . Diabetes Mother   . Heart attack Father     Social History Social History   Tobacco Use  . Smoking status: Never Smoker  . Smokeless tobacco: Never Used  Substance Use Topics  . Alcohol use: No  . Drug use: No    Review of Systems  Level 5 caveat secondary to delirium as well as dementia.   ____________________________________________   PHYSICAL  EXAM:  VITAL SIGNS: ED Triage Vitals  Enc Vitals Group     BP 06/06/17 0902 124/61     Pulse Rate 06/06/17 0902 (!) 121     Resp 06/06/17 0902 20     Temp 06/06/17 0909 100.1 F (37.8 C)     Temp Source 06/06/17 0909 Rectal     SpO2 06/06/17 0901 98 %     Weight 06/06/17 0904 128 lb (58.1 kg)     Height --      Head Circumference --      Peak Flow --      Pain Score --      Pain Loc --      Pain Edu? --      Excl.  in [place]? --     Constitutional: Alert to name only.   in no acute distress. Eyes: Conjunctivae are normal.  Head: Atraumatic. Nose: No congestion/rhinnorhea. Mouth/Throat: Mucous membranes are dry Neck: No stridor.   Cardiovascular: Tachycardic, regular rhythm. Grossly normal heart sounds.  Respiratory: Normal respiratory effort.  No retractions. Lungs CTAB. Gastrointestinal: Soft and nontender. No distention. Musculoskeletal: No lower extremity tenderness nor edema.  No joint effusions. Neurologic:  No gross focal neurologic deficits are appreciated. Skin:  Skin is warm, dry and intact. No rash noted. Psychiatric: Mood and affect are normal. Speech and behavior are normal.  ____________________________________________   LABS (all labs ordered are listed, but only abnormal results are displayed)  Labs Reviewed  CULTURE, BLOOD (ROUTINE X 2)  CULTURE, BLOOD (ROUTINE X 2)  URINE CULTURE  COMPREHENSIVE METABOLIC PANEL  CBC  LACTIC ACID, PLASMA  LACTIC ACID, PLASMA  BLOOD GAS, VENOUS  URINALYSIS, ROUTINE W REFLEX MICROSCOPIC  TROPONIN I  CBG MONITORING, ED   ____________________________________________  EKG  ED ECG REPORT I, Doran Stabler, the attending physician, personally viewed and interpreted this ECG.   Date: 06/06/2017  EKG Time: 904  Rate: 126  Rhythm: sinus tachycardia  Axis: Normal  Intervals:none  ST&T Change: No ST segment elevation or depression.  No abnormal T wave  inversion.  ____________________________________________  RADIOLOGY  No acute finding on the chest x-ray ____________________________________________   PROCEDURES  Procedure(s) performed:   Procedures  Critical Care performed:   CRITICAL CARE Performed by: Doran Stabler   Total critical care time: 35 minutes  Critical care time was exclusive of separately billable procedures and treating other patients.  Critical care was necessary to treat or prevent imminent or life-threatening deterioration.  Critical care was time spent personally by me on the following activities: development of treatment plan with patient and/or surrogate as well as nursing, discussions with consultants, evaluation of patient's response to treatment, examination of patient, obtaining history from patient or surrogate, ordering and performing treatments and interventions, ordering and review of laboratory studies, ordering and review of radiographic studies, pulse oximetry and re-evaluation of patient's condition.  ____________________________________________   INITIAL IMPRESSION / ASSESSMENT AND PLAN / ED COURSE  Pertinent labs & imaging results that were available during my care of the patient were reviewed by me and considered in my medical decision making (see chart for details).  Differential diagnosis includes, but is not limited to, alcohol, illicit or prescription medications, or other toxic ingestion; intracranial pathology such as stroke or intracerebral hemorrhage; fever or infectious causes including sepsis; hypoxemia and/or hypercarbia; uremia; trauma; endocrine related disorders such as diabetes, hypoglycemia, and thyroid-related diseases; hypertensive encephalopathy; etc.  As part of my medical decision making, I reviewed the following data within the electronic MEDICAL RECORD NUMBER Notes from prior ED visits   ----------------------------------------- 10:19 AM on  06/06/2017 -----------------------------------------  Patient with possible DKA.  Low pH.  Increased lactic acid with borderline fever.  Sepsis alert was called and broad-spectrum antibiotics given.  Signed out to Dr. Edwina Barth on the hospitalist service.     ____________________________________________   FINAL CLINICAL IMPRESSION(S) / ED DIAGNOSES  Sepsis.    NEW MEDICATIONS STARTED DURING THIS VISIT:  This SmartLink is deprecated. Use AVSMEDLIST instead to display the medication list for a patient.   Note:  This document was prepared using Dragon voice recognition software and may include unintentional dictation errors.     Orbie Pyo, MD 06/06/17 1019  She found to  be in DKA.  Insulin ordered.  Also in acute renal failure.   Orbie Pyo, MD 06/06/17 [phone]

## 2017-06-06 NOTE — Progress Notes (Signed)
Dr. Mortimer Fries notified of critical lactic acid of 6.5. No new orders currently. Wilnette Kales

## 2017-06-06 NOTE — H&P (Signed)
[name] [name] is an 83 y.o. female.   Chief Complaint: Not as responsive. HPI: This 83 year old female who has a history of diabetes and a recent admission for diabetic ketoacidosis. She was not acting right today was brought in found to be in DKA again. She's had multiple falls as bruising in multiple places. Also has a wrist fracture and right forearm previous fall. Daughter who she lives with his accompanying her. She's a she has no urinary tract infection which was a catalyst for her last episode of DKA.  Past Medical History:  Diagnosis Date  . Anxiety state, unspecified   . CAD (coronary artery disease)   . Cancer [facility])    2004 Right breast, found on mammogram, radiation therapy, Dr. Bryson Ha, uterine cancer,   . COPD (chronic obstructive pulmonary disease) ([place])   . Dementia   . Diabetes mellitus   . Esophageal reflux   . Heart burn   . Hyperlipidemia   . IBS (irritable bowel syndrome)   . Myocardial infarction [facility])    Cath negative except for 40% occlusion LAD.  Pt not candidate for betablocker or ACEI because of hypotension  . OSA (obstructive sleep apnea)   . Osteoporosis 02/21/09   DEXA scan showed osteoporosis with left femur T-score -2.8.  Marland Kitchen Presbyacusis   . Stroke ([place])   . Thyroid disease    Hypothyroid  . Vitamin D deficiency     Past Surgical History:  Procedure Laterality Date  . ABDOMINAL HYSTERECTOMY     uterine cancer  . BREAST SURGERY    . COLON SURGERY  2013   done at [facility]    Family History  Problem Relation Age of Onset  . Diabetes Mother   . Heart attack Father    Social History:  reports that  has never smoked. she has never used smokeless tobacco. She reports that she does not drink alcohol or use drugs.  Allergies:  Allergies  Allergen Reactions  . Penicillins Rash    Has patient had a PCN reaction causing immediate rash, facial/tongue/throat swelling, SOB or lightheadedness with hypotension: No Has patient had a PCN reaction causing  severe rash involving mucus membranes or skin necrosis: No Has patient had a PCN reaction that required hospitalization No Has patient had a PCN reaction occurring within the last 10 years: No If all of the above answers are "NO", then may proceed with Cephalosporin use.      (Not in a hospital admission)  Results for orders placed or performed during the hospital encounter of 06/06/17 (from the past 48 hour(s))  Comprehensive metabolic panel     Status: Abnormal   Collection Time: 06/06/17  9:08 AM  Result Value Ref Range   Sodium 137 135 - 145 mmol/L    Comment: LYTES REPEATED. [facility].   Potassium 5.1 3.5 - 5.1 mmol/L   Chloride 99 (L) 101 - 111 mmol/L   CO2 9 (L) 22 - 32 mmol/L   Glucose, Bld 709 (HH) 65 - 99 mg/dL    Comment: CRITICAL RESULT CALLED TO, READ BACK BY AND VERIFIED WITH ANGELA ROBBINS @ 1027 06/06/17 [facility]    BUN 21 (H) 6 - 20 mg/dL   Creatinine, Ser 2.00 (H) 0.44 - 1.00 mg/dL   Calcium 9.3 8.9 - 10.3 mg/dL   Total Protein 7.2 6.5 - 8.1 g/dL   Albumin 3.6 3.5 - 5.0 g/dL   AST 39 15 - 41 U/L   ALT 20 14 - 54 U/L   Alkaline  Phosphatase 173 (H) 38 - 126 U/L   Total Bilirubin 2.6 (H) 0.3 - 1.2 mg/dL   GFR calc non Af Amer 22 (L) >60 mL/min   GFR calc Af Amer 25 (L) >60 mL/min    Comment: (NOTE) The eGFR has been calculated using the CKD EPI equation. This calculation has not been validated in all clinical situations. eGFR's persistently <60 mL/min signify possible Chronic Kidney Disease.    Anion gap 29 (H) 5 - 15  CBC     Status: Abnormal   Collection Time: 06/06/17  9:08 AM  Result Value Ref Range   WBC 8.6 3.6 - 11.0 K/uL   RBC 2.92 (L) 3.80 - 5.20 MIL/uL   Hemoglobin 9.7 (L) 12.0 - 16.0 g/dL   HCT 31.8 (L) 35.0 - 47.0 %   MCV 108.9 (H) 80.0 - 100.0 fL   MCH 33.4 26.0 - 34.0 pg   MCHC 30.7 (L) 32.0 - 36.0 g/dL   RDW 20.0 (H) 11.5 - 14.5 %   Platelets 382 150 - 440 K/uL  Lactic acid, plasma     Status: Abnormal   Collection Time: 06/06/17  9:08 AM   Result Value Ref Range   Lactic Acid, Venous 5.2 (HH) 0.5 - 1.9 mmol/L    Comment: CRITICAL RESULT CALLED TO, READ BACK BY AND VERIFIED WITH ANGELA ROBBINS @ 1000 06/06/17 [facility]   Blood gas, venous     Status: Abnormal   Collection Time: 06/06/17  9:08 AM  Result Value Ref Range   pH, Ven 7.14 (LL) 7.250 - 7.430    Comment: CRITICAL RESULT CALLED TO, READ BACK BY AND VERIFIED WITH: A ROBBINS RN 1000 [id number] JGT    pCO2, Ven 20 (L) 44.0 - 60.0 mmHg   pO2, Ven 42.0 32.0 - 45.0 mmHg   Patient temperature 37.0    Collection site VENOUS    Sample type VENOUS   Troponin I     Status: Abnormal   Collection Time: 06/06/17  9:08 AM  Result Value Ref Range   Troponin I 0.03 (HH) <0.03 ng/mL    Comment: CRITICAL RESULT CALLED TO, READ BACK BY AND VERIFIED WITH ANGELA ROBBINS @ 1000 06/06/17 [place]    Dg Chest Port 1 View  Result Date: 06/06/2017 CLINICAL DATA:  Respiratory difficulty. EXAM: PORTABLE CHEST 1 VIEW COMPARISON:  05/19/2017 FINDINGS: Cardiac silhouette is normal in size. No mediastinal or hilar masses. No evidence of adenopathy. Lungs are hyperexpanded but clear. No pleural effusion or pneumothorax. Skeletal structures are demineralized but grossly intact. IMPRESSION: No acute cardiopulmonary disease. Electronically Signed   By: Lajean Manes M.D.   On: 06/06/2017 09:49    Review of Systems  Unable to perform ROS: Dementia    Blood pressure 135/71, pulse (!) 120, temperature 100.1 F (37.8 C), temperature source Rectal, resp. rate 17, weight 58.1 kg (128 lb), SpO2 98 %. Physical Exam  Constitutional:  Poorly nourished white female in moderate distress.  HENT:  Head: Normocephalic and atraumatic.  Oral mucosa dry Bruising on the right cheek.  Eyes: Pupils are equal, round, and reactive to light. No scleral icterus.  Neck: Neck supple. No JVD present. No thyromegaly present.  Cardiovascular: Normal rate.  No murmur heard. Respiratory: Effort normal. No respiratory distress.  She exhibits no tenderness.  GI: Soft. Bowel sounds are normal. She exhibits no mass. There is no tenderness.  Musculoskeletal: She exhibits no edema.  Right wrist is in a splint from a previous fracture.  Neurological: She is  alert. No cranial nerve deficit.  Skin: Skin is warm and dry.     Assessment/Plan 1.diabetic ketoacidosis. We'll go ahead and give patient large volume IV fluids. Start insulin drip. Check frequent electrolytes for replenishment. She will be going to the ICU since she's on the drip. 2. Acute renal failure. Likely from the severe dehydration from the high blood sugars. We'll give her IV fluids aggressively. 3. Sepsis. She was febrile coming in. Chest x-ray does not show any pneumonia. However she's very volume depleted. Haven't been out to get urine yet to rule this out. We'll go ahead and cover her with broad-spectrum for now. 4. Diabetes. Will be on insulin drip for now. We'll convert over after birds. 5. Dementia. Worsen baseline at this point. Likely secondary to this stream illness. Daughter has healthcare power of attorney. Next line 6. CODE STATUS. Patient is a DO NOT RESUSCITATE and confirm that with her daughter.  Baxter Hire, MD 06/06/2017, 11:19 AM

## 2017-06-06 NOTE — ED Triage Notes (Signed)
Pt to ED via [facility] from [facility]. Per EMS they were called out for breathing difficulty. Staff at [facility] reports pt has hx/o dementia but seems more altered than normal today. Pt has high blood sugar, at 7am CBG was 519, in ED CBG read "high" when checked x 2. Pt tachycardic on triage.

## 2017-06-06 NOTE — ED Notes (Signed)
Attempted to cath pt, unable to obtain urine at this time

## 2017-06-07 LAB — GLUCOSE, CAPILLARY
GLUCOSE-CAPILLARY: 112 mg/dL — AB (ref 65–99)
GLUCOSE-CAPILLARY: 156 mg/dL — AB (ref 65–99)
GLUCOSE-CAPILLARY: 159 mg/dL — AB (ref 65–99)
GLUCOSE-CAPILLARY: 91 mg/dL (ref 65–99)
GLUCOSE-CAPILLARY: 98 mg/dL (ref 65–99)
Glucose-Capillary: 138 mg/dL — ABNORMAL HIGH (ref 65–99)

## 2017-06-07 LAB — BASIC METABOLIC PANEL
ANION GAP: 5 (ref 5–15)
BUN: 14 mg/dL (ref 6–20)
CALCIUM: 8.3 mg/dL — AB (ref 8.9–10.3)
CHLORIDE: 114 mmol/L — AB (ref 101–111)
CO2: 22 mmol/L (ref 22–32)
Creatinine, Ser: 0.9 mg/dL (ref 0.44–1.00)
GFR calc non Af Amer: 58 mL/min — ABNORMAL LOW (ref >60)
GLUCOSE: 110 mg/dL — AB (ref 65–99)
POTASSIUM: 3.4 mmol/L — AB (ref 3.5–5.1)
Sodium: 141 mmol/L (ref 135–145)

## 2017-06-07 LAB — URINE CULTURE

## 2017-06-07 LAB — PROCALCITONIN: Procalcitonin: 0.21 ng/mL

## 2017-06-07 MED ORDER — ZINC OXIDE 40 % EX OINT
TOPICAL_OINTMENT | CUTANEOUS | Status: DC | PRN
  Filled 2017-06-07 (×2): qty 114

## 2017-06-07 MED ORDER — SODIUM CHLORIDE 0.9 % IV SOLN
INTRAVENOUS | Status: DC
  Administered 2017-06-07 – 2017-06-09 (×3): via INTRAVENOUS

## 2017-06-07 MED ORDER — INSULIN ASPART 100 UNIT/ML ~~LOC~~ SOLN
0.0000 [IU] | Freq: Three times a day (TID) | SUBCUTANEOUS | Status: DC
  Administered 2017-06-07: 2 [IU] via SUBCUTANEOUS
  Administered 2017-06-08: 9 [IU] via SUBCUTANEOUS
  Administered 2017-06-08: 5 [IU] via SUBCUTANEOUS
  Administered 2017-06-08: 3 [IU] via SUBCUTANEOUS
  Administered 2017-06-09: 7 [IU] via SUBCUTANEOUS
  Administered 2017-06-09: 2 [IU] via SUBCUTANEOUS
  Administered 2017-06-09: 9 [IU] via SUBCUTANEOUS
  Administered 2017-06-10: 7 [IU] via SUBCUTANEOUS
  Administered 2017-06-10: 3 [IU] via SUBCUTANEOUS
  Filled 2017-06-07 (×11): qty 1

## 2017-06-07 NOTE — Progress Notes (Signed)
Pt arrived on unit. Skin assessment completed with Wilhemena Durie, RN. IV infusing at 150 mL/hr. VSS. Sacral dressing changed. MD Anselm Jungling notified that Pt's bottom MASD, blisters on bilateral heels, generalized ecchymosis. Orders received for Desitin, prevlon boots, and adjust insulin to ACHS. Orders placed

## 2017-06-07 NOTE — Progress Notes (Signed)
[place] at [place] NAME: [name] [name]    MR#:  [id number]  DATE OF BIRTH:  1931/10/16  SUBJECTIVE:  CHIEF COMPLAINT:   Chief Complaint  Patient presents with  . Altered Mental Status  . Hyperglycemia   Came with DKA, again after recent d/c with same diagnosis. Multiple bruise marks on body, pt is demented, so can not give details, but DKA is resolved and transferred to floor.  REVIEW OF SYSTEMS:  Demented.  ROS  DRUG ALLERGIES:   Allergies  Allergen Reactions  . Penicillins Rash    Has patient had a PCN reaction causing immediate rash, facial/tongue/throat swelling, SOB or lightheadedness with hypotension: No Has patient had a PCN reaction causing severe rash involving mucus membranes or skin necrosis: No Has patient had a PCN reaction that required hospitalization No Has patient had a PCN reaction occurring within the last 10 years: No If all of the above answers are "NO", then may proceed with Cephalosporin use.     VITALS:  Blood pressure 113/61, pulse 70, temperature 98.6 F (37 C), temperature source Axillary, resp. rate 16, height 5\' 4"  (1.626 m), weight 58.1 kg (128 lb 1.4 oz), SpO2 98 %.  PHYSICAL EXAMINATION:   Poorly nourished white female in moderate distress.  HENT:  Head: Normocephalic and atraumatic.  Oral mucosa dry Bruising on the right cheek.  Eyes: Pupils are equal, round, and reactive to light. No scleral icterus.  Neck: Neck supple. No JVD present. No thyromegaly present.  Cardiovascular: Normal rate.  No murmur heard. Respiratory: Effort normal. No respiratory distress. She exhibits no tenderness.  GI: Soft. Bowel sounds are normal. She exhibits no mass. There is no tenderness.  Musculoskeletal: She exhibits no edema.  Right wrist is in a splint from a previous fracture.  Neurological: She is alert. No cranial nerve deficit.  Skin: Skin is warm and dry. multiple bruise marks on both lower  extremities.    Physical Exam LABORATORY PANEL:   CBC Recent Labs  Lab 06/06/17 0908  WBC 8.6  HGB 9.7*  HCT 31.8*  PLT 382   ------------------------------------------------------------------------------------------------------------------  Chemistries  Recent Labs  Lab 06/06/17 0908  06/06/17 1950 06/07/17 0405  NA 137   < > 140 141  K 5.1   < > 3.0* 3.4*  CL 99*   < > 110 114*  CO2 9*   < > 21* 22  GLUCOSE 709*   < > 98 110*  BUN 21*   < > 16 14  CREATININE 2.00*   < > 1.11* 0.90  CALCIUM 9.3   < > 8.4* 8.3*  MG  --   --  1.6*  --   AST 39  --   --   --   ALT 20  --   --   --   ALKPHOS 173*  --   --   --   BILITOT 2.6*  --   --   --    < > = values in this interval not displayed.   ------------------------------------------------------------------------------------------------------------------  Cardiac Enzymes Recent Labs  Lab 06/06/17 0908  TROPONINI 0.03*   ------------------------------------------------------------------------------------------------------------------  RADIOLOGY:  Dg Chest Port 1 View  Result Date: 06/06/2017 CLINICAL DATA:  Respiratory difficulty. EXAM: PORTABLE CHEST 1 VIEW COMPARISON:  05/19/2017 FINDINGS: Cardiac silhouette is normal in size. No mediastinal or hilar masses. No evidence of adenopathy. Lungs are hyperexpanded but clear. No pleural effusion or pneumothorax. Skeletal structures are demineralized but grossly intact.  IMPRESSION: No acute cardiopulmonary disease. Electronically Signed   By: Lajean Manes M.D.   On: 06/06/2017 09:49    ASSESSMENT AND PLAN:   Active Problems:   DKA (diabetic ketoacidoses) ([place])   1.diabetic ketoacidosis.   received large volume IV fluids. insulin drip. Checked frequent electrolytes for replenishment.   DKA corrected now, On basal insuline with sliding scale now.   I tried calling her daughter, to get more info and try to get Idea of Cause for this Episode, as there is no infection.  Left a voice mail on daughter's phone, will try again tomorrow. 2. Acute renal failure. Likely from the severe dehydration from the high blood sugars.    give her IV fluids aggressively. Better. 3. Sepsis. Suspected but ruled out. She was febrile coming in. Chest x-ray does not show any pneumonia. However she's very volume depleted. UA is negative, Xray chest is negative. No need for Abx. Procalcitonin is not high. 4. Diabetes.    Resumed basal insuline. 5. Dementia. Worsen baseline at this point. Likely secondary to this stream illness.    All the records are reviewed and case discussed with Care Management/Social Workerr. Management plans discussed with the patient, family and they are in agreement.  CODE STATUS: DNR>  TOTAL TIME TAKING CARE OF THIS PATIENT: 35 minutes.     POSSIBLE D/C IN 1-2 DAYS, DEPENDING ON CLINICAL CONDITION.   Vaughan Basta M.D on 06/07/2017   Between 7am to 6pm - Pager - [phone]  After 6pm go to [email or web address] - password [email or web address] [facility]  Office  [phone]  CC: Primary care physician; [name], DO  Note: This dictation was prepared with Dragon dictation along with smaller phrase technology. Any transcriptional errors that result from this process are unintentional.

## 2017-06-07 NOTE — Progress Notes (Signed)
Pt being transferred to room 143. Report called to Janett Billow, Therapist, sports. Pt and belongings transferred to room 143 without incident.

## 2017-06-07 NOTE — Progress Notes (Signed)
Pt resting in room. Alert but confused. Pt NPO except sips with meds. Attempted to give PO medication earlier with no success. Pt started coughing and threw up medication. Stayed with pt until episode was over. Will continue to monitor.

## 2017-06-08 LAB — PROCALCITONIN: Procalcitonin: 0.1 ng/mL

## 2017-06-08 LAB — GLUCOSE, CAPILLARY
GLUCOSE-CAPILLARY: 374 mg/dL — AB (ref 65–99)
Glucose-Capillary: 228 mg/dL — ABNORMAL HIGH (ref 65–99)
Glucose-Capillary: 284 mg/dL — ABNORMAL HIGH (ref 65–99)
Glucose-Capillary: 257 mg/dL — ABNORMAL HIGH (ref 65–99)

## 2017-06-08 NOTE — Evaluation (Signed)
Clinical/Bedside Swallow Evaluation Patient Details  Name: [name] [name] MRN: [id number] Date of Birth: 02-11-1932  Today's Date: 06/08/2017 Time: SLP Start Time (ACUTE ONLY): 1100 SLP Stop Time (ACUTE ONLY): 1200 SLP Time Calculation (min) (ACUTE ONLY): 60 min  Past Medical History:  Past Medical History:  Diagnosis Date  . Anxiety state, unspecified   . CAD (coronary artery disease)   . Cancer [facility])    2004 Right breast, found on mammogram, radiation therapy, Dr. Bryson Ha, uterine cancer,   . COPD (chronic obstructive pulmonary disease) ([place])   . Dementia   . Diabetes mellitus   . Esophageal reflux   . Heart burn   . Hyperlipidemia   . IBS (irritable bowel syndrome)   . Myocardial infarction [facility])    Cath negative except for 40% occlusion LAD.  Pt not candidate for betablocker or ACEI because of hypotension  . OSA (obstructive sleep apnea)   . Osteoporosis 02/21/09   DEXA scan showed osteoporosis with left femur T-score -2.8.  Marland Kitchen Presbyacusis   . Stroke ([place])   . Thyroid disease    Hypothyroid  . Vitamin D deficiency    Past Surgical History:  Past Surgical History:  Procedure Laterality Date  . ABDOMINAL HYSTERECTOMY     uterine cancer  . BREAST SURGERY    . COLON SURGERY  2013   done at [facility]   HPI:   Pt is 83 year old female who has a history of diabetes and a recent admission for diabetic ketoacidosis. She was not acting right today was brought in found to be in DKA again. She's had multiple falls as bruising in multiple places. Also has a wrist fracture and right forearm previous fall. Daughter who she lives with his accompanying her. She's a she has no urinary tract infection which was a catalyst for her last episode of DKA. Pt's past med hx includes COPD, Dementia, esophageal reflux, and stroke.   Assessment / Plan / Recommendation Clinical Impression  Pt appears to present w/ moderate Oral Phase Dysphagia w/ mild-moderate increased risk for aspiration. Pt's  declined Cognitive status and oral phase dysphagia could impact pharyngeal phase swallow function thus increase risk for aspiration to occur. Pt's past medical hx includes COPD, Dementia, esophageal reflux, and stroke per chart review. Any respiratory deficits can impact swallow function w/ timing of the swallow leading to increased risk for respiratory decline.  Pt was positioned upright then given Single trials of ice chips, Nectar consistency liquids via Cup and straw, and puree. During trials of ice chips, pt's oral phase appeared grossly [facility] c/b awareness/control of the bolus, adequate mastication, timely A-P transit and swallow, and adequate oral clearing. Pt exhibited mild cough 1x w/ first trial, but coughing did not increase in frequency or intensity for the remainder of any of the trials. No decline in vocal quality or respiratory status were noted. During trials of Nectar consistency liquid via straw and cup, pt's oral phase appeared grossly [facility] c/b awareness/control of the bolus, timely A-P transit, adequate labial seal around straw, and adequate oral clearing. No overt, immediate s/s of aspiration were noted. No decline in vocal quality or respiratory status were noted. During trials of puree, pt exhibited mild oral holding or hesitation on first trial - unsure if related to decline Cognitive status (initiation of task) as this did not occur consistently w/ f/u trials. Pt exhibited adequate labial seal around spoon and adequate oral clearing. No overt, immediate s/s of aspiration were noted. No decline in vocal quality  or respiratory status were noted. Pt required max assistance w/ hand over hand cueing for drinking liquids. Pt benefits from concise sentences/commands (nouns, verbs) and less verbosity as well as reducing distractions during tasks/meals.  D/t pt's Cognitive status and overall presentation, Recommend Dysphagia level 1 diet w/ Nectar consistency liquids initially; recommend SINGLE ice  chips in between meals after oral care; aspiration precautions - small sips/bites, reducing distractions, slow pace; reflux precautions - sitting upright during meals and 30-60 minutes after meal. Recommend pills crushed in puree. Recommend max setup/assist during meals w/ hand over hand cues and reducing distractions. Recommend Dietician consult for support. ST services will f/u w/ pt status, diet toleration, trials to upgrade as appropriate, and caregiver education while admitted. NSG/MD updated.  SLP Visit Diagnosis: Dysphagia, oropharyngeal phase (R13.12)    Aspiration Risk  Mild aspiration risk(-Moderate)    Diet Recommendation   Dysphagia level 1 w/ Nectar consistency liquids; aspiration precautions; tray setup and support during meals.   Medication Administration: Crushed with puree for safer swallowing   Other  Recommendations Recommended Consults: (Dietician f/u) Oral Care Recommendations: Oral care BID;Staff/trained caregiver to provide oral care Other Recommendations: Remove water pitcher   Follow up Recommendations Skilled Nursing facility(TBD)      Frequency and Duration min 2x/week  1 week(during acute hospital stay)       Prognosis Prognosis for Safe Diet Advancement: Fair Barriers to Reach Goals: Cognitive deficits;Severity of deficits      Swallow Study   General Date of Onset: 06/06/17 HPI:  Pt is 83 year old female who has a history of diabetes and a recent admission for diabetic ketoacidosis. She was not acting right today was brought in found to be in DKA again. She's had multiple falls as bruising in multiple places. Also has a wrist fracture and right forearm previous fall. Daughter who she lives with his accompanying her. She's a she has no urinary tract infection which was a catalyst for her last episode of DKA. Type of Study: Bedside Swallow Evaluation Previous Swallow Assessment: none given Diet Prior to this Study: NPO Temperature Spikes Noted: No(wbc  WNL) Respiratory Status: Room air History of Recent Intubation: No Behavior/Cognition: Alert;Cooperative;Confused;Pleasant mood;Distractible;Requires cueing;Impulsive Oral Cavity Assessment: Within Functional Limits Oral Care Completed by SLP: Recent completion by staff Oral Cavity - Dentition: Adequate natural dentition Vision: Functional for self-feeding Self-Feeding Abilities: Able to feed self;Needs assist;Needs set up;Total assist(hand over hand) Patient Positioning: Upright in bed Baseline Vocal Quality: Normal Volitional Cough: Strong Volitional Swallow: Able to elicit    Oral/Motor/Sensory Function Overall Oral Motor/Sensory Function: Within functional limits   Ice Chips Ice chips: Within functional limits(10 trials) Presentation: Spoon   Thin Liquid Thin Liquid: Not tested    Nectar Thick Nectar Thick Liquid: Within functional limits Presentation: Cup;Straw(min impulsive)   Honey Thick Honey Thick Liquid: Not tested   Puree Puree: Impaired Presentation: Spoon(3 trials) Oral Phase Functional Implications: Oral holding(intermittently)   Solid   GO   Solid: Not tested       Carolynn Sayers, SLP-Graduate Student Carolynn Sayers 06/08/2017,12:25 PM  The information in this patient note, response to treatment, and overall treatment plan developed has been reviewed and agreed upon by this clinician.  Orinda Kenner, [place], CCC-SLP [phone] 06/08/17,3:39 PM

## 2017-06-08 NOTE — Progress Notes (Signed)
Family Meeting Note  Advance Directive:yes  Today a meeting took place with the daughter.  Patient is unable to participate due VH:QIONGE capacity demented.   The following clinical team members were present during this meeting:MD  The following were discussed:Patient's diagnosis: terminal dementia, Dysphagia, frequent falls, Uncontrolled DM, frequent DKA , Patient's progosis: < 3 months and Goals for treatment: DNR  I discussed with daughter, as per her- pt have terminal dementia, she can not eat much, falling down frequently and she have no quality of life. She is willing to talk to palliative care or hospice.  Additional follow-up to be provided: palliative care.  Time spent during discussion:20 minutes  [name] [name], Rosalio Macadamia, MD

## 2017-06-08 NOTE — Progress Notes (Signed)
Clinical Education officer, museum (CSW) contacted [employer] who reported that patient is in the memory care unit and can return when stable. Per Thayer Headings patient does have a hospital bed and does not walk at baseline. Per Thayer Headings patient can sit up in the wheel chair for meals. Per Thayer Headings patient can return with [facility] hospice care if needed. Palliative consult is pending.   [employer], LCSW [phone]

## 2017-06-08 NOTE — Progress Notes (Addendum)
Inpatient Diabetes Program Recommendations  AACE/ADA: New Consensus Statement on Inpatient Glycemic Control (2015)  Target Ranges:  Prepandial:   less than 140 mg/dL      Peak postprandial:   less than 180 mg/dL (1-2 hours)      Critically ill patients:  140 - 180 mg/dL   Results for ELGENE, CORAL (MRN [id number]) as of 06/08/2017 09:16  Ref. Range 06/07/2017 07:20 06/07/2017 11:19 06/07/2017 16:23 06/07/2017 21:19 06/08/2017 07:30  Glucose-Capillary Latest Ref Range: 65 - 99 mg/dL 138 (H) 98 159 (H) 156 (H) 374 (H)    Review of Glycemic Control  Diabetes history: DM2 Outpatient Diabetes medications: Lantus 15 units QHS, Novolog 3 units TID with meals Current orders for Inpatient glycemic control: Novolog 0-9 units TID with meals  Inpatient Diabetes Program Recommendations: Insulin - Basal: Noted patient received Lantus 10 units x 1 on 06/06/17 at 20:05 and no Lantus given since then. Fasting glucose 374 mg/dl this morning. Please consider ordering Lantus 10 units Q24H starting now. Correction (SSI): If patient will remain NPO, please change frequency of CBGs and Novolog to Q4H.  Thanks, Barnie Alderman, RN, MSN, CDE Diabetes Coordinator Inpatient Diabetes Program [phone] (Team Pager from 8am to 5pm)

## 2017-06-08 NOTE — Care Management (Signed)
Crystal with [facility] home health updated on patient's status as patient will need resumption of home health services at time of discharge.

## 2017-06-08 NOTE — Progress Notes (Signed)
[place] at [place] NAME: [name] [name]    MR#:  [id number]  DATE OF BIRTH:  17-Jun-1932  SUBJECTIVE:  CHIEF COMPLAINT:   Chief Complaint  Patient presents with  . Altered Mental Status  . Hyperglycemia   Came with DKA, again after recent d/c with same diagnosis. Multiple bruise marks on body, pt is demented, so can not give details, but DKA is resolved and transferred to floor.  Remains confused, no complains- but noted to have severe dysphagia and aspirations on trial of any type of food or liquid by nurses.  REVIEW OF SYSTEMS:  Demented.  ROS  DRUG ALLERGIES:   Allergies  Allergen Reactions  . Penicillins Rash    Has patient had a PCN reaction causing immediate rash, facial/tongue/throat swelling, SOB or lightheadedness with hypotension: No Has patient had a PCN reaction causing severe rash involving mucus membranes or skin necrosis: No Has patient had a PCN reaction that required hospitalization No Has patient had a PCN reaction occurring within the last 10 years: No If all of the above answers are "NO", then may proceed with Cephalosporin use.     VITALS:  Blood pressure (!) 123/58, pulse 86, temperature 97.7 F (36.5 C), temperature source Oral, resp. rate 20, height 5\' 4"  (1.626 m), weight 58.1 kg (128 lb 1.4 oz), SpO2 100 %.  PHYSICAL EXAMINATION:   Poorly nourished white female in moderate distress.  HENT:  Head: Normocephalic and atraumatic.  Oral mucosa dry Bruising on the right cheek.  Eyes: Pupils are equal, round, and reactive to light. No scleral icterus.  Neck: Neck supple. No JVD present. No thyromegaly present.  Cardiovascular: Normal rate.  No murmur heard. Respiratory: Effort normal. No respiratory distress. She exhibits no tenderness.  GI: Soft. Bowel sounds are normal. She exhibits no mass. There is no tenderness.  Musculoskeletal: She exhibits no edema.  Right wrist is in a splint from a previous  fracture.  Neurological: She is alert. No cranial nerve deficit.  Skin: Skin is warm and dry. multiple bruise marks on both lower extremities.    Physical Exam LABORATORY PANEL:   CBC Recent Labs  Lab 06/06/17 0908  WBC 8.6  HGB 9.7*  HCT 31.8*  PLT 382   ------------------------------------------------------------------------------------------------------------------  Chemistries  Recent Labs  Lab 06/06/17 0908  06/06/17 1950 06/07/17 0405  NA 137   < > 140 141  K 5.1   < > 3.0* 3.4*  CL 99*   < > 110 114*  CO2 9*   < > 21* 22  GLUCOSE 709*   < > 98 110*  BUN 21*   < > 16 14  CREATININE 2.00*   < > 1.11* 0.90  CALCIUM 9.3   < > 8.4* 8.3*  MG  --   --  1.6*  --   AST 39  --   --   --   ALT 20  --   --   --   ALKPHOS 173*  --   --   --   BILITOT 2.6*  --   --   --    < > = values in this interval not displayed.   ------------------------------------------------------------------------------------------------------------------  Cardiac Enzymes Recent Labs  Lab 06/06/17 0908  TROPONINI 0.03*   ------------------------------------------------------------------------------------------------------------------  RADIOLOGY:  No results found.  ASSESSMENT AND PLAN:   Active Problems:   DKA (diabetic ketoacidoses) ([place])   1.diabetic ketoacidosis.   received large volume IV fluids. insulin drip.  Checked frequent electrolytes for replenishment.   DKA corrected now, On basal insuline with sliding scale now.  Recurrent admissions for DKA.  Not able to eat much as per daughter.    She have frequent falls also.  Daughter will speak to her brother and also will like to meet palliative care and hospice. 2. Acute renal failure. Likely from the severe dehydration from the high blood sugars.    give her IV fluids aggressively. Better. 3. Sepsis. Suspected but ruled out. She was febrile coming in. Chest x-ray does not show any pneumonia. However she's very volume  depleted. UA is negative, Xray chest is negative. No need for Abx. Procalcitonin is not high. 4. Diabetes.    Resumed basal insuline. 5. Dementia. Have termianl dementia as per daughter.    All the records are reviewed and case discussed with Care Management/Social Workerr. Management plans discussed with the patient, family and they are in agreement.  CODE STATUS: DNR>  TOTAL TIME TAKING CARE OF THIS PATIENT: 45 minutes.    POSSIBLE D/C IN 1-2 DAYS, DEPENDING ON CLINICAL CONDITION.   Vaughan Basta M.D on 06/08/2017   Between 7am to 6pm - Pager - [phone]  After 6pm go to [email or web address] - password [email or web address] [place] [facility]  Office  [phone]  CC: Primary care physician; [name], DO  Note: This dictation was prepared with Dragon dictation along with smaller phrase technology. Any transcriptional errors that result from this process are unintentional.

## 2017-06-08 NOTE — NC FL2 (Signed)
[place] LEVEL OF CARE SCREENING TOOL     IDENTIFICATION  Patient Name: [name] [name] Birthdate: 1931/12/24 Sex: female Admission Date (Current Location): 06/06/2017  [place] and [place] Number:  Engineering geologist and Address:  [facility], [address], [place], [place] [address]      Provider Number: [id number]  Attending Physician Name and Address:  Vaughan Basta, *  Relative Name and Phone Number:       Current Level of Care: Hospital Recommended Level of Care: Assisted Living Facility(Memory Care ) Prior Approval Number:    Date Approved/Denied:   PASRR Number: ([id number] )  Discharge Plan: Domiciliary (Rest home)(Memory Care )    Current Diagnoses: Primary: Vascular Dementia Patient Active Problem List   Diagnosis Date Noted  . C. difficile colitis 05/20/2017  . Hyperglycemia   . Acute kidney injury ([place])   . Metabolic encephalopathy 78/67/6720  . Adjustment disorder with mixed anxiety and depressed mood   . Coffee ground emesis 02/12/2017  . Acute on chronic renal failure ([place]) 01/13/2017  . Dementia, vascular 12/03/2016  . Stroke ([place]) 08/20/2016  . Peripheral vascular disease ([place]) 04/09/2016  . DKA (diabetic ketoacidoses) ([place]) 06/08/2015  . CKD stage 3 due to type 2 diabetes mellitus ([place]) 02/22/2014  . Hypothyroidism 09/23/2012  . GERD (gastroesophageal reflux disease) 09/23/2012  . Anxiety 07/26/2012  . Hyperlipidemia with target LDL less than 70 12/03/2011  . DM (diabetes mellitus) type II uncontrolled, periph vascular disorder ([place]) 04/03/2011  . CAD (coronary artery disease) 12/13/2009    Orientation RESPIRATION BLADDER Height & Weight     Self  Normal Incontinent Weight: 128 lb 1.4 oz (58.1 kg) Height:  5\' 4"  (162.6 cm)  BEHAVIORAL SYMPTOMS/MOOD NEUROLOGICAL BOWEL NUTRITION STATUS      Incontinent Diet(Dysphagia level 1 w/ Nectar consistency liquids; aspiration precautions; tray  setup and support during meals)  AMBULATORY STATUS COMMUNICATION OF NEEDS Skin   Extensive Assist Verbally Normal                       Personal Care Assistance Level of Assistance  Bathing, Feeding, Dressing Bathing Assistance: Limited assistance Feeding assistance: Independent Dressing Assistance: Limited assistance Total Care Assistance: Limited assistance   Functional Limitations Info  Sight, Hearing, Speech Sight Info: Adequate Hearing Info: Adequate Speech Info: Adequate    SPECIAL [place] to follow patient.                   Contractures Contractures Info: Not present    Additional Factors Info  Code Status, Allergies Code Status Info: (DNR ) Allergies Info: (Penicillins. )           Current Medications (06/08/2017):  This is the current hospital active medication list Current Facility-Administered Medications  Medication Dose Route Frequency Provider Last Rate Last Dose  . 0.9 %  sodium chloride infusion   Intravenous Continuous Vaughan Basta, MD 75 mL/hr at 06/07/17 2330    . acetaminophen (TYLENOL) tablet 650 mg  650 mg Oral Q6H PRN Baxter Hire, MD       Or  . acetaminophen (TYLENOL) suppository 650 mg  650 mg Rectal Q6H PRN Baxter Hire, MD      . aspirin chewable tablet 81 mg  81 mg Oral Daily Baxter Hire, MD   81 mg at 06/08/17 1158  . atorvastatin (LIPITOR) tablet 80 mg  80 mg Oral q1800 Baxter Hire, MD      .  chlorhexidine (PERIDEX) 0.12 % solution 15 mL  15 mL Mouth Rinse BID Flora Lipps, MD   15 mL at 06/08/17 0857  . clopidogrel (PLAVIX) tablet 75 mg  75 mg Oral Daily Baxter Hire, MD   75 mg at 06/08/17 1158  . fluticasone (FLONASE) 50 MCG/ACT nasal spray 2 spray  2 spray Each Nare Daily Baxter Hire, MD   2 spray at 06/08/17 0857  . heparin injection 5,000 Units  5,000 Units Subcutaneous Q8H Baxter Hire, MD   5,000 Units at 06/08/17 1517  . insulin aspart (novoLOG) injection  0-9 Units  0-9 Units Subcutaneous TID WC Vaughan Basta, MD   3 Units at 06/08/17 1157  . levothyroxine (SYNTHROID, LEVOTHROID) tablet 100 mcg  100 mcg Oral QAC breakfast Baxter Hire, MD   100 mcg at 06/07/17 0802  . liver oil-zinc oxide (DESITIN) 40 % ointment   Topical PRN Vaughan Basta, MD      . MEDLINE mouth rinse  15 mL Mouth Rinse q12n4p Flora Lipps, MD   15 mL at 06/08/17 1517  . midodrine (PROAMATINE) tablet 10 mg  10 mg Oral TID WC Baxter Hire, MD   10 mg at 06/08/17 1157  . ondansetron (ZOFRAN) tablet 4 mg  4 mg Oral Q6H PRN Baxter Hire, MD       Or  . ondansetron [facility]) injection 4 mg  4 mg Intravenous Q6H PRN Baxter Hire, MD      . polyethylene glycol [facility] / GLYCOLAX) packet 17 g  17 g Oral Daily Baxter Hire, MD   17 g at 06/07/17 0801  . senna-docusate (Senokot-S) tablet 2 tablet  2 tablet Oral Daily Baxter Hire, MD   2 tablet at 06/08/17 1157  . sertraline (ZOLOFT) tablet 25 mg  25 mg Oral QPC breakfast Baxter Hire, MD   25 mg at 06/08/17 1158     Discharge Medications: Please see discharge summary for a list of discharge medications.  Relevant Imaging Results:  Relevant Lab Results:   Additional Information (SSN: [id number])  Jeylin [place]mansee, Veronia Beets, LCSW

## 2017-06-09 DIAGNOSIS — Z7189 Other specified counseling: Secondary | ICD-10-CM

## 2017-06-09 DIAGNOSIS — E1310 Other specified diabetes mellitus with ketoacidosis without coma: Secondary | ICD-10-CM

## 2017-06-09 LAB — CBC
HCT: 23.7 % — ABNORMAL LOW (ref 35.0–47.0)
Hemoglobin: 7.8 g/dL — ABNORMAL LOW (ref 12.0–16.0)
MCH: 32.9 pg (ref 26.0–34.0)
MCHC: 33 g/dL (ref 32.0–36.0)
MCV: 99.9 fL (ref 80.0–100.0)
Platelets: 230 10*3/uL (ref 150–440)
RBC: 2.38 MIL/uL — ABNORMAL LOW (ref 3.80–5.20)
RDW: 18.8 % — ABNORMAL HIGH (ref 11.5–14.5)
WBC: 6.2 10*3/uL (ref 3.6–11.0)

## 2017-06-09 LAB — BASIC METABOLIC PANEL
Anion gap: 10 (ref 5–15)
BUN: 7 mg/dL (ref 6–20)
CHLORIDE: 104 mmol/L (ref 101–111)
CO2: 20 mmol/L — AB (ref 22–32)
CREATININE: 0.8 mg/dL (ref 0.44–1.00)
Calcium: 7.9 mg/dL — ABNORMAL LOW (ref 8.9–10.3)
GFR calc non Af Amer: >60 mL/min (ref >60)
Glucose, Bld: 471 mg/dL — ABNORMAL HIGH (ref 65–99)
Potassium: 3.3 mmol/L — ABNORMAL LOW (ref 3.5–5.1)
Sodium: 134 mmol/L — ABNORMAL LOW (ref 135–145)

## 2017-06-09 LAB — GLUCOSE, CAPILLARY
Glucose-Capillary: >600 mg/dL (ref 65–99)
Glucose-Capillary: 492 mg/dL — ABNORMAL HIGH (ref 65–99)
Glucose-Capillary: 340 mg/dL — ABNORMAL HIGH (ref 65–99)
Glucose-Capillary: 198 mg/dL — ABNORMAL HIGH (ref 65–99)
Glucose-Capillary: 170 mg/dL — ABNORMAL HIGH (ref 65–99)
Glucose-Capillary: 145 mg/dL — ABNORMAL HIGH (ref 65–99)

## 2017-06-09 LAB — MAGNESIUM: Magnesium: 1.5 mg/dL — ABNORMAL LOW (ref 1.7–2.4)

## 2017-06-09 MED ORDER — MAGNESIUM SULFATE 2 GM/50ML IV SOLN
2.0000 g | Freq: Once | INTRAVENOUS | Status: AC
  Administered 2017-06-09: 2 g via INTRAVENOUS
  Filled 2017-06-09: qty 50

## 2017-06-09 MED ORDER — INSULIN GLARGINE 100 UNIT/ML ~~LOC~~ SOLN
10.0000 [IU] | Freq: Every day | SUBCUTANEOUS | Status: DC
  Administered 2017-06-09 – 2017-06-10 (×2): 10 [IU] via SUBCUTANEOUS
  Filled 2017-06-09 (×3): qty 0.1

## 2017-06-09 MED ORDER — INSULIN ASPART 100 UNIT/ML ~~LOC~~ SOLN
12.0000 [IU] | Freq: Once | SUBCUTANEOUS | Status: AC
  Administered 2017-06-09: 3 [IU] via SUBCUTANEOUS
  Filled 2017-06-09: qty 1

## 2017-06-09 MED ORDER — POTASSIUM CHLORIDE IN NACL 40-0.9 MEQ/L-% IV SOLN
INTRAVENOUS | Status: DC
  Administered 2017-06-09: 75 mL/h via INTRAVENOUS
  Filled 2017-06-09 (×4): qty 1000

## 2017-06-09 MED ORDER — PREMIER PROTEIN SHAKE
11.0000 [oz_av] | ORAL | Status: DC
  Administered 2017-06-09: 11 [oz_av] via ORAL

## 2017-06-09 MED ORDER — GLUCERNA SHAKE PO LIQD: 237.0000 mL | Freq: Two times a day (BID) | ORAL | Status: DC

## 2017-06-09 NOTE — Progress Notes (Signed)
Speech Language Pathology Treatment: Dysphagia  Patient Details Name: [name] [name] MRN: [id number] DOB: [birth year] Today's Date: 06/09/2017 Time: 2426-8341 SLP Time Calculation (min) (ACUTE ONLY): 45 min  Assessment / Plan / Recommendation Clinical Impression  Pt was seen for ongoing swallow assessment to upgrade diet (to thin liquids) as appropriate. Pt was alert and awake but w/ declined Cognitive status(Dementia baseline); noted speech responses were slow, delayed and speech slightly mumbled. NSG reported pt had increased appetite for breakfast meal. Pt was given trials of ice chips and thin liquid via CUP then Straw during this session.  During trials of ice chips, pt's oral phase was grossly WFL c/b adequate mastication and control/awareness of the bolus and adequate oral clearing. No overt, immediate s/s of aspiration were noted. No decline in vocal quality or respiratory status were noted. During trials of thin liquid via CUP then Straw. Pt exhibited min oral holding w/ thin liquid intermittently - could be d/t Cognitive decline impacting A-P transfer of bolus. Pt had mild cough 1x during session w/ total trials of thin liquids - coughing did not increase in frequency or intensity during the remainder of the session and no decline in vocal quality or respiratory status were noted. Pt exhibits min impulsivity when drinking and was educated on taking small sips but then had to have straw pinched and cup monitored d/t inconsistency w/ follow through. Pt will benefit from reinforcement of taking SMALL SINGLE SIPS SLOWLY during meals. Pt was able to hold cup herself w/ min support/guidance from [name]. ST educated on/posted aspiration precautions - small sips/bites, slow pace, reducing environmental distractions, however, d/t pt's Cognitive decline, reinforcement will be required.  D/t pt's current presentation Recommend thin liquids via CUP (monitor any straw use carefully and stop if coughing noted)  w/ strict aspiration precautions - sitting upright, small sips, slow pace, and reducing environmental distractions. Recommend min assist/set up during meals and full supervision to monitor pt's eating behaviors (impulsivity). Recommend f/u w/ next venue/setting for trials to upgrade food consistency of diet if appropriate as this will require more sessions w/ a meal of various textured foods of dysphagia level 2/3. Continue to monitor toleration of thin liquids and downgrade to a Nectar consistency if poor toleration is noted or negative sequelae from aspiration is suspected - pt continues to have increased risk for aspiration w/ impact on her pulmonary status d/t the degree of Cognitive decline from the Dementia impacting her functional status overall.  ST will continue to f/u w/ pt status, diet toleration, education while admitted. MD/NSG updated.    HPI HPI:  Pt is 83 year old female who has a history of diabetes and a recent admission for diabetic ketoacidosis. She was not acting right today was brought in found to be in DKA again. She's had multiple falls as bruising in multiple places. Also has a wrist fracture and right forearm previous fall. Daughter who she lives with his accompanying her. She's a she has no urinary tract infection which was a catalyst for her last episode of DKA.      SLP Plan  Continue with current plan of care;Goals updated       Recommendations  Diet recommendations: Dysphagia 1 (puree);Thin liquid Liquids provided via: Cup;No straw Medication Administration: Crushed with puree Supervision: Patient able to self feed;Staff to assist with self feeding;Full supervision/cueing for compensatory strategies Compensations: Minimize environmental distractions;Slow rate;Small sips/bites;Lingual sweep for clearance of pocketing;Follow solids with liquid Postural Changes and/or Swallow Maneuvers: Seated upright 90 degrees;Upright 30-60 min  after meal                Oral  Care Recommendations: Oral care BID;Staff/trained caregiver to provide oral care Follow up Recommendations: Skilled Nursing facility SLP Visit Diagnosis: Dysphagia, oropharyngeal phase (R13.12) Plan: Continue with current plan of care;Goals updated       [place], SLP-Graduate Student Carolynn Sayers 06/09/2017, 11:31 AM   The information in this patient note, response to treatment, and overall treatment plan developed has been reviewed and agreed upon by this clinician.  Orinda Kenner, [place], [place] [phone] 06/09/17,3:56 PM

## 2017-06-09 NOTE — Progress Notes (Signed)
Initial Nutrition Assessment  DOCUMENTATION CODES:   Non-severe (moderate) malnutrition in context of chronic illness  INTERVENTION:  Provide Glucerna Shake po BID, each supplement provides 220 kcal and 10 grams of protein.  Provide Premier Protein po once daily, each supplement provides 160 kcal and 30 grams of protein.  Provide 1:1 assistance with meals.  NUTRITION DIAGNOSIS:   Moderate Malnutrition related to chronic illness(dementia, COPD) as evidenced by moderate fat depletion, moderate muscle depletion.  GOAL:   Patient will meet greater than or equal to 90% of their needs  MONITOR:   PO intake, Supplement acceptance, Labs, Weight trends, I & O's  REASON FOR ASSESSMENT:   Low [place]    ASSESSMENT:   83 year old female with PMHx of dementia, DM, OSA, OP, HLD, CAD, hx MI, anxiety, vitamin D deficiency, COPD, IBS, hx right breat cancer admitted with DKA and acute renal failure.   Met with patient at bedside. She is unable to provide history in setting of her dementia. Patient well-known to this RD from several previous admissions. Per chart patient had 50% of one of her meals yesterday. Discussed patient with SLP who reports her liquids were advanced to thin today (remains on dysphagia 1).  On a previous admission when patient was able to to communicate with RD she had reported UBW was 148 lbs.Patient was 150.2 lbs on 08/20/2016. She has now lost 22.1 lbs (14.7% body weight) over 10 months, which is not significant for time frame.  Medications reviewed and include: Novolog 0-9 units TID, Lantus 10 units daily, levothyroxine, Miralax, senna, NS with KCl 40 mEq/L @ 75 mL/hr, magnesium sulfate 2 grams once IV.  Labs reviewed: CBG 198-492, Sodium 134, Potassium 3.3, CO2 20.  NUTRITION - FOCUSED PHYSICAL EXAM:    Most Recent Value  Orbital Region  Mild depletion  Upper Arm Region  Mild depletion  Thoracic and Lumbar Region  Mild depletion  Buccal Region  Mild depletion   Temple Region  Mild depletion  Clavicle Bone Region  Moderate depletion  Clavicle and Acromion Bone Region  Moderate depletion  Scapular Bone Region  Moderate depletion  Dorsal Hand  Moderate depletion  Patellar Region  Moderate depletion  Anterior Thigh Region  Moderate depletion  Posterior Calf Region  Moderate depletion  Edema (RD Assessment)  None  Hair  Reviewed  Eyes  Unable to assess  Mouth  Unable to assess  Skin  Reviewed  Nails  Reviewed     Diet Order:  DIET - DYS 1 Room service appropriate? Yes with Assist; Fluid consistency: Thin  EDUCATION NEEDS:   Not appropriate for education at this time  Skin:  Skin Assessment: Skin Integrity Issues: Skin Integrity Issues:: Other (Comment) Other: large serous and blood blisters to bilateral ankles, MSAD to perineum and buttocks  Last BM:  06/09/2017 - medium type 6  Height:   Ht Readings from Last 1 Encounters:  06/06/17 _0  (1.626 m)    Weight:   Wt Readings from Last 1 Encounters:  06/06/17 128 lb 1.4 oz (58.1 kg)    Ideal Body Weight:  54.5 kg  BMI:  Body mass index is 21.99 kg/m.  Estimated Nutritional Needs:   Kcal:  1450-1745 (25-30 kcal/kg)  Protein:  70-80 (1.2-1.4 grams/kg)  Fluid:  1.4 L/day (25 mL/kg)  Willey Blade, MS, RD, LDN Office: [phone] Pager: [phone] After Hours/Weekend Pager: [phone]

## 2017-06-09 NOTE — Progress Notes (Signed)
[place] [facility] [place] NAME: [name] [name]    MR#:  [id number]  DATE OF BIRTH:  1931/10/13  SUBJECTIVE:  CHIEF COMPLAINT:   Chief Complaint  Patient presents with  . Altered Mental Status  . Hyperglycemia   Came with DKA, again after recent d/c with same diagnosis. Multiple bruise marks on body, pt is demented, so can not give details, but DKA is resolved and transferred to floor.  Remains confused, no complains- but noted to have severe dysphagia and aspirations .  REVIEW OF SYSTEMS:  Demented.  ROS  DRUG ALLERGIES:   Allergies  Allergen Reactions  . Penicillins Rash    Has patient had a PCN reaction causing immediate rash, facial/tongue/throat swelling, SOB or lightheadedness with hypotension: No Has patient had a PCN reaction causing severe rash involving mucus membranes or skin necrosis: No Has patient had a PCN reaction that required hospitalization No Has patient had a PCN reaction occurring within the last 10 years: No If all of the above answers are "NO", then may proceed with Cephalosporin use.     VITALS:  Blood pressure (!) 114/55, pulse 89, temperature 98 F (36.7 C), temperature source Oral, resp. rate 16, height 5\' 4"  (1.626 m), weight 58.1 kg (128 lb 1.4 oz), SpO2 96 %.  PHYSICAL EXAMINATION:   Poorly nourished white female in moderate distress.  HENT:  Head: Normocephalic and atraumatic.  Oral mucosa dry Bruising on the right cheek.  Eyes: Pupils are equal, round, and reactive to light. No scleral icterus.  Neck: Neck supple. No JVD present. No thyromegaly present.  Cardiovascular: Normal rate.  No murmur heard. Respiratory: Effort normal. No respiratory distress. She exhibits no tenderness.  GI: Soft. Bowel sounds are normal. She exhibits no mass. There is no tenderness.  Musculoskeletal: She exhibits no edema.  Right wrist is in a splint from a previous fracture.  Neurological: She is alert. No cranial  nerve deficit.  Skin: Skin is warm and dry. multiple bruise marks on both lower extremities.    Physical Exam LABORATORY PANEL:   CBC Recent Labs  Lab 06/09/17 0354  WBC 6.2  HGB 7.8*  HCT 23.7*  PLT 230   ------------------------------------------------------------------------------------------------------------------  Chemistries  Recent Labs  Lab 06/06/17 0908  06/09/17 0354  NA 137   < > 134*  K 5.1   < > 3.3*  CL 99*   < > 104  CO2 9*   < > 20*  GLUCOSE 709*   < > 471*  BUN 21*   < > 7  CREATININE 2.00*   < > 0.80  CALCIUM 9.3   < > 7.9*  MG  --    < > 1.5*  AST 39  --   --   ALT 20  --   --   ALKPHOS 173*  --   --   BILITOT 2.6*  --   --    < > = values in this interval not displayed.   ------------------------------------------------------------------------------------------------------------------  Cardiac Enzymes Recent Labs  Lab 06/06/17 0908  TROPONINI 0.03*   ------------------------------------------------------------------------------------------------------------------  RADIOLOGY:  No results found.  ASSESSMENT AND PLAN:   Active Problems:   DKA (diabetic ketoacidoses) ([place])   1.diabetic ketoacidosis.   received large volume IV fluids. insulin drip. Checked frequent electrolytes for replenishment.   DKA corrected now, On basal insuline with sliding scale now.  Recurrent admissions for DKA.  Not able to eat much as per daughter.  She have frequent falls also.  Daughter will speak to her brother and also will like to meet palliative care and hospice. 2. Acute renal failure. Likely from the severe dehydration from the high blood sugars.    give her IV fluids aggressively. Better. 3. Sepsis. Suspected but ruled out. She was febrile coming in. Chest x-ray does not show any pneumonia. However she's very volume depleted. UA is negative, Xray chest is negative. No need for Abx. Procalcitonin is not high. 4. Diabetes.    Resumed basal  insuline.   On ISS. 5. Dementia. Have termianl dementia as per daughter.    All the records are reviewed and case discussed with Care Management/Social Workerr. Management plans discussed with the patient, family and they are in agreement.  CODE STATUS: DNR>  TOTAL TIME TAKING CARE OF THIS PATIENT: 35 minutes.    POSSIBLE D/C IN 1-2 DAYS, DEPENDING ON CLINICAL CONDITION.   Vaughan Basta M.D on 06/09/2017   Between 7am to 6pm - Pager - [phone]  After 6pm go to [email or web address] - password [email or web address] [place] [facility]  Office  [phone]  CC: Primary care physician; [name], DO  Note: This dictation was prepared with Dragon dictation along with smaller phrase technology. Any transcriptional errors that result from this process are unintentional.

## 2017-06-09 NOTE — Consult Note (Signed)
Consultation Note Date: 06/09/2017   Patient Name: [name] [name]  DOB: 09-09-1931  MRN: [id number]  Age / Sex: 83 y.o., female  PCP: [name] [name] Referring Physician: Vaughan [name], *  Reason for Consultation: Establishing goals of care and Inpatient hospice referral  HPI/Patient Profile: [name] [name]  is a 83 y.o. female with a known history of diabetes, CAD, COPD, CVA and OSA. Per EMR, the patient was found with an elevated glucose with concern for DKA. She was found high blood sugar more than 1000. She has dementia, has not been eating much and has dysphagia, and has been having frequent falls.     Clinical Assessment and Goals of Care: [name] [name] is resting in bed. She answers yes to every question including questions which are not yes/no appropriate including how long have you had diabetes.   Spoke with her daughter [name] [name] who states she has already spoken with primary team physician; she states her mother has been fading for the last few years. She states that this year she has been more in hospital than out of it.  She states she was placed in the memory facility this past month. Prior to that she was in other facilities for the last few years. She is concerned over several falls that have occurred just in the last month and a half. Her glucose levels have been very high. [name] [name] states she recognizes her mother's decline and does not want to see her mother suffer any longer. She states she has been speaking with her siblings which agree with the plan for hospice in facility.   Daughter states she is health care POA of patient, and her brother is financial POA . She states she speaks with the other siblings before making decisions, except one whom she does not have a phone number for and is not involved. She states that [employer] was the POA, however, she is deceased. Ms.  Moshe [name] states it would be okay to speak with brother [name] [name] (durable POA). He states he is aware of his mother's current status and explains in detail the patient's current status. He states it would be selfish to try to keep her here and does not want her to suffer. He is aware I am waiting for call from [name] [name] for clearance to proceed with Hospice.     SUMMARY OF RECOMMENDATIONS   Daughter leaning toward returning to facility with hospice, however, she states she wants to speak with an additional sibling first. Will make definitive decision tomorrow.    Code Status/Advance Care Planning:  DNR   Palliative Prophylaxis:   Aspiration  Additional Recommendations (Limitations, Scope, Preferences):  DNR  Prognosis:   < 6 months  Discharge Planning: Return to facility with hospice.      Primary Diagnoses: Present on Admission: . DKA (diabetic ketoacidoses) ([place])   I have reviewed the medical record, interviewed the patient and family, and examined the patient. The following aspects are pertinent.  Past Medical History:  Diagnosis Date  .  Anxiety state, unspecified   . CAD (coronary artery disease)   . Cancer [facility])    2004 Right breast, found on mammogram, radiation therapy, Dr. Bryson Ha, uterine cancer,   . COPD (chronic obstructive pulmonary disease) ([place])   . Dementia   . Diabetes mellitus   . Esophageal reflux   . Heart burn   . Hyperlipidemia   . IBS (irritable bowel syndrome)   . Myocardial infarction [facility])    Cath negative except for 40% occlusion LAD.  Pt not candidate for betablocker or ACEI because of hypotension  . OSA (obstructive sleep apnea)   . Osteoporosis 02/21/09   DEXA scan showed osteoporosis with left femur T-score -2.8.  Marland Kitchen Presbyacusis   . Stroke ([place])   . Thyroid disease    Hypothyroid  . Vitamin D deficiency    Social History   Socioeconomic History  . Marital status: Single    Spouse name: None  . Number of children: None  . Years  of education: None  . Highest education level: None  Social Needs  . Financial resource strain: None  . Food insecurity - worry: None  . Food insecurity - inability: None  . Transportation needs - medical: None  . Transportation needs - non-medical: None  Occupational History  . Occupation: retired  Tobacco Use  . Smoking status: Never Smoker  . Smokeless tobacco: Never Used  Substance and Sexual Activity  . Alcohol use: No  . Drug use: No  . Sexual activity: None  Other Topics Concern  . None  Social History Narrative  . None   Family History  Problem Relation Age of Onset  . Diabetes Mother   . Heart attack Father    Scheduled Meds: . aspirin  81 mg Oral Daily  . atorvastatin  80 mg Oral q1800  . chlorhexidine  15 mL Mouth Rinse BID  . clopidogrel  75 mg Oral Daily  . fluticasone  2 spray Each Nare Daily  . heparin  5,000 Units Subcutaneous Q8H  . insulin aspart  0-9 Units Subcutaneous TID WC  . insulin glargine  10 Units Subcutaneous Daily  . levothyroxine  100 mcg Oral QAC breakfast  . mouth rinse  15 mL Mouth Rinse q12n4p  . midodrine  10 mg Oral TID WC  . polyethylene glycol  17 g Oral Daily  . senna-docusate  2 tablet Oral Daily  . sertraline  25 mg Oral QPC breakfast   Continuous Infusions: . 0.9 % NaCl with KCl 40 mEq / L 75 mL/hr (06/09/17 0934)   PRN Meds:.acetaminophen **OR** acetaminophen, liver oil-zinc oxide, ondansetron **OR** ondansetron (ZOFRAN) IV Medications Prior to Admission:  Prior to Admission medications   Medication Sig Start Date End Date Taking? Authorizing Provider  acetaminophen (TYLENOL) 500 MG tablet Take 1,000 mg by mouth every 8 (eight) hours.   Yes [provider]  aspirin 81 MG chewable tablet Chew 81 mg by mouth daily.   Yes [provider]  atorvastatin (LIPITOR) 80 MG tablet Take 1 tablet (80 mg total) by mouth daily. 08/21/16  Yes Cook, Jayce G, [name]  cephALEXin (KEFLEX) 500 MG capsule Take 500 mg 2 (two)  times daily by mouth.   Yes [provider]  clopidogrel (PLAVIX) 75 MG tablet TAKE 1 TABLET(75 MG) BY MOUTH DAILY 01/01/17  Yes Cook, Jayce G, [name]  fluticasone (FLONASE) 50 MCG/ACT nasal spray Place 2 sprays into both nostrils daily. 01/01/17  Yes Cook, Jayce G, [name]  insulin aspart (NOVOLOG) 100  UNIT/ML injection Inject 3 Units 3 (three) times daily before meals into the skin.   Yes [provider]  insulin lispro (HUMALOG) 100 UNIT/ML injection Inject 0-0.15 mLs (0-15 Units total) into the skin 3 (three) times daily with meals. Correction coverage: Moderate (average weight, post-op)  CBG < 70: implement hypoglycemia protocol  CBG 70 - 120: 0 units  CBG 121 - 150: 2 units  CBG 151 - 200: 3 units  CBG 201 - 250: 5 units  CBG 251 - 300: 8 units  CBG 301 - 350: 11 units  CBG 351 - 400: 15 units  CBG > 400 call MD and obtain STAT lab verification 05/29/17  Yes Mody, Sital, MD  levothyroxine (SYNTHROID, LEVOTHROID) 100 MCG tablet TAKE 1 TABLET(100 MCG) BY MOUTH DAILY 01/01/17  Yes Cook, Jayce G, [name]  midodrine (PROAMATINE) 10 MG tablet Take 1 tablet (10 mg total) by mouth 3 (three) times daily with meals. 01/30/17  Yes Cook, Jayce G, [name]  polyethylene glycol (MIRALAX / GLYCOLAX) packet Take 17 g by mouth daily. Mix 17 grams in 4 to 8 ounces of fluid and give by mouth daily   Yes [provider]  senna-docusate (SENOKOT-S) 8.6-50 MG tablet Take 2 tablets by mouth daily.   Yes [provider]  sertraline (ZOLOFT) 25 MG tablet Take 1 tablet (25 mg total) by mouth daily after breakfast. 02/17/17  Yes Fritzi Mandes, MD  insulin glargine (LANTUS) 100 UNIT/ML injection Inject 0.15 mLs (15 Units total) into the skin at bedtime. 05/29/17   Bettey Costa, MD  metoCLOPramide (REGLAN) 5 MG tablet Take 5 mg by mouth 4 (four) times daily -  before meals and at bedtime.     [provider]  ondansetron (ZOFRAN-ODT) 8 MG disintegrating tablet Take 8 mg by mouth every 4 (four) hours as  needed for nausea or vomiting.    [provider]  oxyCODONE (ROXICODONE) 5 MG immediate release tablet Take 0.5 tablets (2.5 mg total) by mouth every 4 (four) hours as needed for moderate pain. 05/29/17 05/29/18  Bettey Costa, MD   Allergies  Allergen Reactions  . Penicillins Rash    Has patient had a PCN reaction causing immediate rash, facial/tongue/throat swelling, SOB or lightheadedness with hypotension: No Has patient had a PCN reaction causing severe rash involving mucus membranes or skin necrosis: No Has patient had a PCN reaction that required hospitalization No Has patient had a PCN reaction occurring within the last 10 years: No If all of the above answers are "NO", then may proceed with Cephalosporin use.    Review of Systems  Unable to perform ROS   Physical Exam  Constitutional: No distress.  Eyes: EOM are normal.  Pulmonary/Chest: Effort normal.  Neurological: She is alert.  Confused    Vital Signs: BP (!) 114/55 (BP Location: Right Arm)   Pulse 89   Temp 98 F (36.7 C) (Oral)   Resp 16   Ht 5\' 4"  (1.626 m)   Wt 58.1 kg (128 lb 1.4 oz)   SpO2 96%   BMI 21.99 kg/m  Pain Assessment: PAINAD   Pain Score: Asleep   SpO2: SpO2: 96 % O2 Device:SpO2: 96 % O2 Flow Rate: .   IO: Intake/output summary:   Intake/Output Summary (Last 24 hours) at 06/09/2017 1221 Last data filed at 06/09/2017 0534 Gross per 24 hour  Intake 1225 ml  Output -  Net 1225 ml    LBM: Last BM Date: 06/08/17 Baseline Weight: Weight: 58.1 kg (  128 lb) Most recent weight: Weight: 58.1 kg (128 lb 1.4 oz)     Palliative Assessment/Data: 30%     Time In: 12:00 Time Out: 12:50 Time Total:50 minutes Greater than 50%  of this time was spent counseling and coordinating care related to the above assessment and plan.  Signed by: Asencion Gowda, NP 06/09/2017 12:54 PM Office: [phone] 7am-7pm  Pager: [phone] Call primary team after hours   Please contact  Palliative Medicine Team phone at [phone] for questions and concerns.  For individual provider: See Shea Evans

## 2017-06-09 NOTE — Care Management Important Message (Signed)
Important Message  Patient Details  Name: [name] [name] MRN: [id number] Date of Birth: [birth year]   Medicare Important Message Given:  Yes    Marshell Garfinkel, RN 06/09/2017, 8:38 AM

## 2017-06-09 NOTE — Progress Notes (Signed)
Inpatient Diabetes Program Recommendations  AACE/ADA: New Consensus Statement on Inpatient Glycemic Control (2015)  Target Ranges:  Prepandial:   less than 140 mg/dL      Peak postprandial:   less than 180 mg/dL (1-2 hours)      Critically ill patients:  140 - 180 mg/dL  Results for [name] [name], [name] [name] (MRN [id number]) as of 06/09/2017 08:37  Ref. Range 06/09/2017 03:54  Glucose Latest Ref Range: 65 - 99 mg/dL 471 (H)   Results for [name], [name] (MRN [id number]) as of 06/09/2017 08:37  Ref. Range 06/08/2017 07:30 06/08/2017 11:32 06/08/2017 16:52 06/08/2017 21:09  Glucose-Capillary Latest Ref Range: 65 - 99 mg/dL 374 (H) 228 (H) 284 (H) 257 (H)   Review of Glycemic Control  Diabetes history: DM2 Outpatient Diabetes medications: Lantus 15 units QHS, Novolog 3 units TID with meals Current orders for Inpatient glycemic control: Novolog 0-9 units TID with meals, Lantus 10 units daily  Inpatient Diabetes Program Recommendations:  Insulin - Basal: Noted Lantus 10 units ordered this morning. Correction (SSI): Please add Novolog 0-5 units QHS for bedtime correction scale. Insulin - Meal Coverage: Please consider ordering Novolog 3 units TID with meals for meal coverage if patient eats at least 50% of meals.  Thanks, Barnie Alderman, RN, MSN, CDE Diabetes Coordinator Inpatient Diabetes Program [phone] (Team Pager from 8am to 5pm)

## 2017-06-10 DIAGNOSIS — L8990 Pressure ulcer of unspecified site, unspecified stage: Secondary | ICD-10-CM

## 2017-06-10 LAB — GLUCOSE, CAPILLARY
GLUCOSE-CAPILLARY: 279 mg/dL — AB (ref 65–99)
GLUCOSE-CAPILLARY: 343 mg/dL — AB (ref 65–99)
Glucose-Capillary: >600 mg/dL (ref 65–99)
Glucose-Capillary: 242 mg/dL — ABNORMAL HIGH (ref 65–99)

## 2017-06-10 MED ORDER — INSULIN GLARGINE 100 UNIT/ML ~~LOC~~ SOLN: 10.0000 [IU] | Freq: Every day | SUBCUTANEOUS | 11 refills | Status: AC

## 2017-06-10 MED ORDER — GLUCERNA SHAKE PO LIQD: 237.0000 mL | Freq: Two times a day (BID) | ORAL | 0 refills | Status: AC

## 2017-06-10 NOTE — Progress Notes (Addendum)
Patient is medically stable for D/C back to [place] today. Clinical Education officer, museum (CSW) contacted patient's son [name] and provided hospice choice as discussed with palliative team. [name] chose [facility]. CSW also attempted to contacted patient's daughter [name] however she did not answer and a voicemail was left. RN will call report and arrange EMS for transport. [facility] administrator is aware of above and stated that patient can return. CSW sent D/C summary and FL2 to [facility]. Santiago Glad hospice liaison is aware of above. Please reconsult if future social work needs arise. CSW signing off.   Patient's daughter [name] called CSW back and is in agreement with D/C plan and [facility].   [employer], LCSW [phone]

## 2017-06-10 NOTE — Progress Notes (Signed)
Pt ready for discharge to [facility]. Attempted to call report to facility x 2 w/ no answer. Pt will transfer via EMS. Will cont to monitor until pt is transferred.

## 2017-06-10 NOTE — NC FL2 (Signed)
[place] LEVEL OF CARE SCREENING TOOL     IDENTIFICATION  Patient Name: [name] [name] Birthdate: Jan 08, 1932 Sex: female Admission Date (Current Location): 06/06/2017  [place] and [place] Number:  Engineering geologist and Address:  [facility], [address] [place], [place] [address]      Provider Number: [id number]  Attending Physician Name and Address:  Vaughan Basta, *  Relative Name and Phone Number:       Current Level of Care: Hospital Recommended Level of Care: Assisted Living Facility(Memory Care ) Prior Approval Number:    Date Approved/Denied:   PASRR Number: [id number] )  Discharge Plan: Domiciliary (Rest home)(Memory Care )    Current Diagnoses: Primary: Vascular Dementia Patient Active Problem List   Diagnosis Date Noted  . Pressure injury of skin 06/10/2017  . C. difficile colitis 05/20/2017  . Hyperglycemia   . Acute kidney injury ([place])   . Metabolic encephalopathy 70/17/7939  . Adjustment disorder with mixed anxiety and depressed mood   . Coffee ground emesis 02/12/2017  . Acute on chronic renal failure ([place]) 01/13/2017  . Dementia, vascular 12/03/2016  . Stroke ([place]) 08/20/2016  . Peripheral vascular disease ([place]) 04/09/2016  . DKA (diabetic ketoacidoses) ([place]) 06/08/2015  . CKD stage 3 due to type 2 diabetes mellitus ([place]) 02/22/2014  . Hypothyroidism 09/23/2012  . GERD (gastroesophageal reflux disease) 09/23/2012  . Anxiety 07/26/2012  . Hyperlipidemia with target LDL less than 70 12/03/2011  . DM (diabetes mellitus) type II uncontrolled, periph vascular disorder ([place]) 04/03/2011  . CAD (coronary artery disease) 12/13/2009    Orientation RESPIRATION BLADDER Height & Weight     Self  Normal Incontinent Weight: 128 lb 1.4 oz (58.1 kg) Height:  5\' 4"  (162.6 cm)  BEHAVIORAL SYMPTOMS/MOOD NEUROLOGICAL BOWEL NUTRITION STATUS      Incontinent Dysphagia 1 (puree);Thin liquid  AMBULATORY  STATUS COMMUNICATION OF NEEDS Skin   Extensive Assist Verbally Normal                       Personal Care Assistance Level of Assistance  Bathing, Feeding, Dressing Bathing Assistance: Limited assistance Feeding assistance: Independent Dressing Assistance: Limited assistance Total Care Assistance: Limited assistance   Functional Limitations Info  Sight, Hearing, Speech Sight Info: Adequate Hearing Info: Adequate Speech Info: Adequate    SPECIAL CARE FACTORS FREQUENCY  Patient will be followed by [facility]                  Contractures Contractures Info: Not present    Additional Factors Info  Code Status, Allergies Code Status Info: (DNR ) Allergies Info: (Penicillins. )          Discharge Medications: Please see discharge summary for a list of discharge medications.  Current Discharge Medication List        START taking these medications   Details  feeding supplement, GLUCERNA SHAKE, (GLUCERNA SHAKE) LIQD Take 237 mLs 2 (two) times daily between meals by mouth. Qty: 10 Can, Refills: 0          CONTINUE these medications which have CHANGED   Details  insulin glargine (LANTUS) 100 UNIT/ML injection Inject 0.1 mLs (10 Units total) daily into the skin. Qty: 10 mL, Refills: 11          CONTINUE these medications which have NOT CHANGED   Details  acetaminophen (TYLENOL) 500 MG tablet Take 1,000 mg by mouth every 8 (eight) hours.    aspirin 81 MG  chewable tablet Chew 81 mg by mouth daily.    atorvastatin (LIPITOR) 80 MG tablet Take 1 tablet (80 mg total) by mouth daily. Qty: 90 tablet, Refills: 3    clopidogrel (PLAVIX) 75 MG tablet TAKE 1 TABLET(75 MG) BY MOUTH DAILY Qty: 90 tablet, Refills: 3    fluticasone (FLONASE) 50 MCG/ACT nasal spray Place 2 sprays into both nostrils daily. Qty: 16 g, Refills: 6    insulin aspart (NOVOLOG) 100 UNIT/ML injection Inject 3 Units 3 (three) times daily before meals into the skin.     insulin lispro (HUMALOG) 100 UNIT/ML injection Inject 0-0.15 mLs (0-15 Units total) into the skin 3 (three) times daily with meals. Correction coverage: Moderate (average weight, post-op)       CBG < 70:   implement hypoglycemia protocol            CBG 70 - 120:         0 units     CBG 121 - 150:       2 units     CBG 151 - 200:       3 units     CBG 201 - 250:       5 units     CBG 251 - 300:       8 units     CBG 301 - 350:       11 units     CBG 351 - 400:       15 units     CBG > 400  call MD and obtain STAT lab verification Qty: 10 mL, Refills: 0    levothyroxine (SYNTHROID, LEVOTHROID) 100 MCG tablet TAKE 1 TABLET(100 MCG) BY MOUTH DAILY Qty: 90 tablet, Refills: 3    midodrine (PROAMATINE) 10 MG tablet Take 1 tablet (10 mg total) by mouth 3 (three) times daily with meals. Qty: 90 tablet, Refills: 1    polyethylene glycol (MIRALAX / GLYCOLAX) packet Take 17 g by mouth daily. Mix 17 grams in 4 to 8 ounces of fluid and give by mouth daily    senna-docusate (SENOKOT-S) 8.6-50 MG tablet Take 2 tablets by mouth daily.    sertraline (ZOLOFT) 25 MG tablet Take 1 tablet (25 mg total) by mouth daily after breakfast. Qty: 30 tablet, Refills: 1    metoCLOPramide (REGLAN) 5 MG tablet Take 5 mg by mouth 4 (four) times daily -  before meals and at bedtime.     ondansetron (ZOFRAN-ODT) 8 MG disintegrating tablet Take 8 mg by mouth every 4 (four) hours as needed for nausea or vomiting.    oxyCODONE (ROXICODONE) 5 MG immediate release tablet Take 0.5 tablets (2.5 mg total) by mouth every 4 (four) hours as needed for moderate pain. Qty: 15 tablet, Refills: 0         STOP taking these medications     cephALEXin (KEFLEX) 500 MG capsule       Relevant Imaging Results: Relevant Lab Results: Additional Information (SSN: [id number])  Quame Spratlin, Veronia Beets, LCSW

## 2017-06-10 NOTE — Discharge Summary (Addendum)
[facility] NAME: [place]    MR#:  [id number]  DATE OF BIRTH:  1932-04-19  DATE OF ADMISSION:  06/06/2017 ADMITTING PHYSICIAN: Baxter Hire, MD  DATE OF DISCHARGE: 06/10/2017  PRIMARY CARE PHYSICIAN: [name], DO    ADMISSION DIAGNOSIS:  Sepsis, due to unspecified organism [facility]) [A41.9] Acute renal failure, unspecified acute renal failure type ([place]) [N17.9] Diabetic ketoacidosis without coma associated with other specified diabetes mellitus ([place]) [E13.10]  DISCHARGE DIAGNOSIS:  Active Problems:   DKA (diabetic ketoacidoses) ([place])   Pressure injury of skin   SECONDARY DIAGNOSIS:   Past Medical History:  Diagnosis Date  . Anxiety state, unspecified   . CAD (coronary artery disease)   . Cancer [facility])    2004 Right breast, found on mammogram, radiation therapy, Dr. Bryson Ha, uterine cancer,   . COPD (chronic obstructive pulmonary disease) ([place])   . Dementia   . Diabetes mellitus   . Esophageal reflux   . Heart burn   . Hyperlipidemia   . IBS (irritable bowel syndrome)   . Myocardial infarction [facility])    Cath negative except for 40% occlusion LAD.  Pt not candidate for betablocker or ACEI because of hypotension  . OSA (obstructive sleep apnea)   . Osteoporosis 02/21/09   DEXA scan showed osteoporosis with left femur T-score -2.8.  Marland Kitchen Presbyacusis   . Stroke ([place])   . Thyroid disease    Hypothyroid  . Vitamin D deficiency     HOSPITAL COURSE:   1.diabetic ketoacidosis.  received large volume IV fluids. insulin drip. Checked frequent electrolytes for replenishment.   DKA corrected now, On basal insuline with sliding scale now.  Recurrent admissions for DKA.  Not able to eat much as per daughter.    She have frequent falls also.  Had discussions with daughter and palliative care also met with family. They agreed on Hospice care at her facility.  2. Acute renal failure. Likely from the severe  dehydration from the high blood sugars.    give her IV fluids aggressively. Better. 3. Sepsis. Suspected but ruled out. She was febrile coming in. Chest x-ray does not show any pneumonia. However she's very volume depleted. UA is negative, Xray chest is negative. No need for Abx. Procalcitonin is not high. 4. Diabetes.    Resumed basal insuline.   On ISS. 5. Dementia. Have termianl dementia as per daughter.     DISCHARGE CONDITIONS:   Stable.  CONSULTS OBTAINED:    DRUG ALLERGIES:   Allergies  Allergen Reactions  . Penicillins Rash    Has patient had a PCN reaction causing immediate rash, facial/tongue/throat swelling, SOB or lightheadedness with hypotension: No Has patient had a PCN reaction causing severe rash involving mucus membranes or skin necrosis: No Has patient had a PCN reaction that required hospitalization No Has patient had a PCN reaction occurring within the last 10 years: No If all of the above answers are "NO", then may proceed with Cephalosporin use.     DISCHARGE MEDICATIONS:   Current Discharge Medication List    START taking these medications   Details  feeding supplement, GLUCERNA SHAKE, (GLUCERNA SHAKE) LIQD Take 237 mLs 2 (two) times daily between meals by mouth. Qty: 10 Can, Refills: 0      CONTINUE these medications which have CHANGED   Details  insulin glargine (LANTUS) 100 UNIT/ML injection Inject 0.1 mLs (10 Units total) daily into the skin. Qty: 10 mL, Refills:  11      CONTINUE these medications which have NOT CHANGED   Details  acetaminophen (TYLENOL) 500 MG tablet Take 1,000 mg by mouth every 8 (eight) hours.    aspirin 81 MG chewable tablet Chew 81 mg by mouth daily.    atorvastatin (LIPITOR) 80 MG tablet Take 1 tablet (80 mg total) by mouth daily. Qty: 90 tablet, Refills: 3    clopidogrel (PLAVIX) 75 MG tablet TAKE 1 TABLET(75 MG) BY MOUTH DAILY Qty: 90 tablet, Refills: 3    fluticasone (FLONASE) 50 MCG/ACT nasal spray Place  2 sprays into both nostrils daily. Qty: 16 g, Refills: 6    insulin aspart (NOVOLOG) 100 UNIT/ML injection Inject 3 Units 3 (three) times daily before meals into the skin.    insulin lispro (HUMALOG) 100 UNIT/ML injection Inject 0-0.15 mLs (0-15 Units total) into the skin 3 (three) times daily with meals. Correction coverage: Moderate (average weight, post-op)  CBG < 70: implement hypoglycemia protocol  CBG 70 - 120: 0 units  CBG 121 - 150: 2 units  CBG 151 - 200: 3 units  CBG 201 - 250: 5 units  CBG 251 - 300: 8 units  CBG 301 - 350: 11 units  CBG 351 - 400: 15 units  CBG > 400 call MD and obtain STAT lab verification Qty: 10 mL, Refills: 0    levothyroxine (SYNTHROID, LEVOTHROID) 100 MCG tablet TAKE 1 TABLET(100 MCG) BY MOUTH DAILY Qty: 90 tablet, Refills: 3    midodrine (PROAMATINE) 10 MG tablet Take 1 tablet (10 mg total) by mouth 3 (three) times daily with meals. Qty: 90 tablet, Refills: 1    polyethylene glycol (MIRALAX / GLYCOLAX) packet Take 17 g by mouth daily. Mix 17 grams in 4 to 8 ounces of fluid and give by mouth daily    senna-docusate (SENOKOT-S) 8.6-50 MG tablet Take 2 tablets by mouth daily.    sertraline (ZOLOFT) 25 MG tablet Take 1 tablet (25 mg total) by mouth daily after breakfast. Qty: 30 tablet, Refills: 1    metoCLOPramide (REGLAN) 5 MG tablet Take 5 mg by mouth 4 (four) times daily -  before meals and at bedtime.     ondansetron (ZOFRAN-ODT) 8 MG disintegrating tablet Take 8 mg by mouth every 4 (four) hours as needed for nausea or vomiting.    oxyCODONE (ROXICODONE) 5 MG immediate release tablet Take 0.5 tablets (2.5 mg total) by mouth every 4 (four) hours as needed for moderate pain. Qty: 15 tablet, Refills: 0      STOP taking these medications     cephALEXin (KEFLEX) 500 MG capsule          DISCHARGE INSTRUCTIONS:    Hospice nurse to follow at facility.  Diet Recommended for Discharge:  Dysphagia level 1(puree) w/ Gravy to moisten and  flavor well; Thin liquids(cup or straw) - but drinking of liquids MUST be monitored at all times d/t impulsive drinking behavior secondary to her declined Cognitive status. Aspiration precautions including reducing distractions during meals. Pills given in Puree - Crushed as able. Tray setup and feeding support at meals.        If you experience worsening of your admission symptoms, develop shortness of breath, life threatening emergency, suicidal or homicidal thoughts you must seek medical attention immediately by calling 911 or calling your MD immediately  if symptoms less severe.  You Must read complete instructions/literature along with all the possible adverse reactions/side effects for all the Medicines you take and that have been  prescribed to you. Take any new Medicines after you have completely understood and accept all the possible adverse reactions/side effects.   Please note  You were cared for by a hospitalist during your hospital stay. If you have any questions about your discharge medications or the care you received while you were in the hospital after you are discharged, you can call the unit and asked to speak with the hospitalist on call if the hospitalist that took care of you is not available. Once you are discharged, your primary care physician will handle any further medical issues. Please note that NO REFILLS for any discharge medications will be authorized once you are discharged, as it is imperative that you return to your primary care physician (or establish a relationship with a primary care physician if you do not have one) for your aftercare needs so that they can reassess your need for medications and monitor your lab values.    Today   CHIEF COMPLAINT:   Chief Complaint  Patient presents with  . Altered Mental Status  . Hyperglycemia    HISTORY OF PRESENT ILLNESS:  [name] [name]  is a 83 y.o. female with a known history of diabetes and a recent admission for  diabetic ketoacidosis. She was not acting right today was brought in found to be in DKA again. She's had multiple falls as bruising in multiple places. Also has a wrist fracture and right forearm previous fall. Daughter who she lives with his accompanying her. She's a she has no urinary tract infection which was a catalyst for her last episode of DKA.   VITAL SIGNS:  Blood pressure 112/64, pulse 84, temperature 97.8 F (36.6 C), temperature source Oral, resp. rate 14, height 5' 4"  (1.626 m), weight 58.1 kg (128 lb 1.4 oz), SpO2 99 %.  I/O:  No intake or output data in the 24 hours ending 06/10/17 1135  PHYSICAL EXAMINATION:   Poorly nourished white female in moderate distress. HENT:  Head:Normocephalicand atraumatic. Oral mucosa dry Bruising on the right cheek. Eyes:Pupils are equal, round, and reactive to light.No scleral icterus.  Neck:Neck supple.No JVDpresent. No thyromegalypresent.  Cardiovascular:Normal rate. No murmurheard. Respiratory:Effort normal. Norespiratory distress. She exhibitsno tenderness.  ON:GEXB.Bowel sounds are normal. She exhibitsno mass. There isno tenderness.  Musculoskeletal: She exhibits noedema. Right wrist is in a splint from a previous fracture. Neurological: She isalert. Nocranial nerve deficit.  Skin: Skin iswarmand dry.multiple bruise marks on both lower extremities.    DATA REVIEW:   CBC Recent Labs  Lab 06/09/17 0354  WBC 6.2  HGB 7.8*  HCT 23.7*  PLT 230    Chemistries  Recent Labs  Lab 06/06/17 0908  06/09/17 0354  NA 137   < > 134*  K 5.1   < > 3.3*  CL 99*   < > 104  CO2 9*   < > 20*  GLUCOSE 709*   < > 471*  BUN 21*   < > 7  CREATININE 2.00*   < > 0.80  CALCIUM 9.3   < > 7.9*  MG  --    < > 1.5*  AST 39  --   --   ALT 20  --   --   ALKPHOS 173*  --   --   BILITOT 2.6*  --   --    < > = values in this interval not displayed.    Cardiac Enzymes Recent Labs  Lab 06/06/17 0908   TROPONINI 0.03*    Microbiology  Results  Results for orders placed or performed during the hospital encounter of 06/06/17  Blood culture (routine x 2)     Status: None (Preliminary result)   Collection Time: 06/06/17  9:08 AM  Result Value Ref Range Status   Specimen Description BLOOD RT [facility]  Final   Special Requests   Final    BOTTLES DRAWN AEROBIC AND ANAEROBIC Blood Culture adequate volume   Culture NO GROWTH 4 DAYS  Final   Report Status PENDING  Incomplete  Urine culture     Status: Abnormal   Collection Time: 06/06/17  9:08 AM  Result Value Ref Range Status   Specimen Description URINE, CATHETERIZED  Final   Special Requests NONE  Final   Culture 20,000 COLONIES/mL YEAST (A)  Final   Report Status 06/07/2017 FINAL  Final  Blood culture (routine x 2)     Status: None (Preliminary result)   Collection Time: 06/06/17  9:26 AM  Result Value Ref Range Status   Specimen Description BLOOD RT ARM  Final   Special Requests   Final    BOTTLES DRAWN AEROBIC AND ANAEROBIC Blood Culture results may not be optimal due to an excessive volume of blood received in culture bottles   Culture NO GROWTH 4 DAYS  Final   Report Status PENDING  Incomplete    RADIOLOGY:  No results found.  EKG:   Orders placed or performed during the hospital encounter of 06/06/17  . EKG 12-Lead  . EKG 12-Lead  . ED EKG 12-Lead  . ED EKG 12-Lead  . EKG 12-Lead  . EKG 12-Lead      Management plans discussed with the patient, family and they are in agreement.  CODE STATUS:     Code Status Orders  (From admission, onward)        Start     Ordered   06/06/17 1230  Do not attempt resuscitation (DNR)  Continuous    Question Answer Comment  In the event of cardiac or respiratory ARREST Do not call a "code blue"   In the event of cardiac or respiratory ARREST Do not perform Intubation, CPR, defibrillation or ACLS   In the event of cardiac or respiratory ARREST Use medication by any route, position,  wound care, and other measures to relive pain and suffering. May use oxygen, suction and manual treatment of airway obstruction as needed for comfort.      06/06/17 1230    Code Status History    Date Active Date Inactive Code Status Order ID Comments User Context   06/06/2017 12:30 06/06/2017 12:30 DNR [id number]  Baxter Hire, MD Inpatient   05/27/2017 20:53 05/29/2017 19:59 DNR [id number]  Demetrios Loll, MD Inpatient   05/20/2017 05:27 05/21/2017 21:01 Full Code [id number]  Harrie Foreman, MD Inpatient   05/05/2017 14:36 05/05/2017 18:40 Full Code [id number]  Hessie Knows, MD Inpatient   04/15/2017 21:02 04/21/2017 19:16 Full Code [id number]  Henreitta Leber, MD Inpatient   04/05/2017 16:08 04/07/2017 21:19 Full Code [id number]  [name], MD Inpatient   02/12/2017 22:42 02/16/2017 18:46 Full Code [id number]  Lance Coon, MD Inpatient   01/13/2017 20:22 01/16/2017 19:10 Full Code [id number]  Harvie Bridge, DO Inpatient   11/06/2016 11:26 11/15/2016 02:06 DNR [id number]  Flora Lipps, MD Inpatient   11/06/2016 03:25 11/06/2016 11:26 Full Code [id number]  Saundra Shelling, MD Inpatient   07/15/2016 18:36 07/17/2016 22:11 Full Code [id number]  Fritzi Mandes, MD Inpatient   06/08/2015 21:01 06/11/2015 21:20 Full Code  [id number]  Henreitta Leber, MD Inpatient   06/08/2015 17:16 06/08/2015 21:01 Full Code [id number]  Henreitta Leber, MD ED      TOTAL TIME TAKING CARE OF THIS PATIENT: 35 minutes.    Vaughan Basta M.D on 06/10/2017 at 11:35 AM  Between 7am to 6pm - Pager - [phone]  After 6pm go to [email or web address] - password [email or web address] [place] [facility]  Office  [phone]  CC: Primary care physician; [name], DO   Note: This dictation was prepared with Dragon dictation along with smaller phrase technology. Any transcriptional errors that result from this process are unintentional.

## 2017-06-10 NOTE — Plan of Care (Signed)
  Progressing Education: Knowledge of General Education information will improve 06/10/2017 1620 - Progressing by Milderd Meager, RN Health Behavior/Discharge Planning: Ability to manage health-related needs will improve 06/10/2017 1620 - Progressing by Milderd Meager, RN Clinical Measurements: Ability to maintain clinical measurements within normal limits will improve 06/10/2017 1620 - Progressing by Milderd Meager, RN Will remain free from infection 06/10/2017 1620 - Progressing by Milderd Meager, RN Diagnostic test results will improve 06/10/2017 1620 - Progressing by Milderd Meager, RN Respiratory complications will improve 06/10/2017 1620 - Progressing by Milderd Meager, RN Cardiovascular complication will be avoided 06/10/2017 1620 - Progressing by Milderd Meager, RN Activity: Risk for activity intolerance will decrease 06/10/2017 1620 - Progressing by Milderd Meager, RN Nutrition: Adequate nutrition will be maintained 06/10/2017 1620 - Progressing by Milderd Meager, RN Coping: Level of anxiety will decrease 06/10/2017 1620 - Progressing by Milderd Meager, RN Elimination: Will not experience complications related to bowel motility 06/10/2017 1620 - Progressing by Milderd Meager, RN Will not experience complications related to urinary retention 06/10/2017 1620 - Progressing by Milderd Meager, RN Pain Managment: General experience of comfort will improve 06/10/2017 1620 - Progressing by Milderd Meager, RN Safety: Ability to remain free from injury will improve 06/10/2017 1620 - Progressing by Milderd Meager, RN Skin Integrity: Risk for impaired skin integrity will decrease 06/10/2017 1620 - Progressing by Milderd Meager, RN

## 2017-06-10 NOTE — Progress Notes (Signed)
Attempted to give pt medications and pt refused after first bite (meds crushed in applesauce) pt stated she did not want to take it. Encouraged pt once more but pt said "no" . Pt accepted insulin dose and asked for milk. Provided milk to pt.

## 2017-06-10 NOTE — Progress Notes (Signed)
New referral for [facility] at [facility] received from [place]. Patient is an 83 year old woman  with a known history of diabetes, CAD, COPD, CVA and OSA admitted to [facility] from [facility] on 11/10 for Hyperglycemia and altered mental status. She required an insulin drip and admitted to the ICU. Palliative Medicine was consulted and met today with patient's son and has spoken to her other children via telephone. They are all in agreement for patient to discahrge with the support of hospice services at [facility]. Plan is for discharge today. Patient information faxed to referral. Patient will discharge via EMS, signed DNR in place in discharge packet.  Flo Shanks RN, BSN, [facility] [place], hospital Liaison [phone]

## 2017-06-10 NOTE — Progress Notes (Signed)
Inpatient Diabetes Program Recommendations  AACE/ADA: New Consensus Statement on Inpatient Glycemic Control (2015)  Target Ranges:  Prepandial:   less than 140 mg/dL      Peak postprandial:   less than 180 mg/dL (1-2 hours)      Critically ill patients:  140 - 180 mg/dL   Results for JEANINE, CAVEN (MRN [id number]) as of 06/10/2017 09:55  Ref. Range 06/09/2017 07:52 06/09/2017 12:14 06/09/2017 15:32 06/09/2017 16:56 06/09/2017 21:15 06/10/2017 07:26  Glucose-Capillary Latest Ref Range: 65 - 99 mg/dL 492 (H) 340 (H) 198 (H) 170 (H) 145 (H) 242 (H)   Review of Glycemic Control  Diabetes history: DM2 Outpatient Diabetes medications: Lantus 15 units QHS, Novolog 3 units TID with meals Current orders for Inpatient glycemic control:Novolog 0-9 units TID with meals, Lantus 10 units daily  Inpatient Diabetes Program Recommendations: Insulin - Basal: Please consider increasing Lantus to 12 units daily. Correction (SSI): Please add Novolog 0-5 units QHS for bedtime correction scale. Insulin - Meal Coverage: Please consider ordering Novolog 3 units TID with meals for meal coverage if patient eats at least 50% of meals.  Thanks, Barnie Alderman, RN, MSN, CDE Diabetes Coordinator Inpatient Diabetes Program [phone] (Team Pager from 8am to 5pm)

## 2017-06-10 NOTE — Discharge Instructions (Signed)
Hospice nurse to follow at Facility

## 2017-06-10 NOTE — Progress Notes (Signed)
Daily Progress Note   Patient Name: [name] [name]       Date: 06/10/2017 DOB: [birth year]  Age: 83 y.o. MRN#: [id number] Attending Physician: Vaughan Basta, * Primary Care Physician: [name], DO Admit Date: 06/06/2017  Reason for Consultation/Follow-up: Establishing goals of care  Subjective:  Spoke with [name] [name], she states she has spoken with her family, and would like her to return to facility with Hospice.  Went to see [name] [name] at bedside.  [name] [name] is resting in bed with son [name] [name] and granddaughter [name] [name] at bedside. [name] [name] is feeding her breakfast.  She is [name] upon talking with her but states during conversation with family "I'm ready to go home". Her granddaughter asked "which home?" [name] states "home to be with Jesus". [name] [name] asked her if she was ready to see her mother and father and she said "yes". When discussing her glucose levels and medications, she does say " I want to keep my insulin". Family at bedside states they do not want to see her suffer, and are okay with hospice, not transferring back and forth to hospital. They request deferment to [facility]  for any document signatures.   Spoke with [name] [name] yesterday and [name] [name] yesterday as well who are in agreement for return to facility with hospice care.   Length of Stay: 4  Current Medications: Scheduled Meds:  . aspirin  81 mg Oral Daily  . atorvastatin  80 mg Oral q1800  . chlorhexidine  15 mL Mouth Rinse BID  . clopidogrel  75 mg Oral Daily  . feeding supplement (GLUCERNA SHAKE)  237 mL Oral BID BM  . fluticasone  2 spray Each Nare Daily  . heparin  5,000 Units Subcutaneous Q8H  . insulin aspart  0-9 Units Subcutaneous TID WC  . insulin glargine  10 Units Subcutaneous Daily  . levothyroxine  100  mcg Oral QAC breakfast  . mouth rinse  15 mL Mouth Rinse q12n4p  . midodrine  10 mg Oral TID WC  . polyethylene glycol  17 g Oral Daily  . protein supplement shake  11 oz Oral Q24H  . senna-docusate  2 tablet Oral Daily  . sertraline  25 mg Oral QPC breakfast    Continuous Infusions: . 0.9 % NaCl with KCl 40 mEq / L 75 mL/hr (06/09/17 0934)  PRN Meds: acetaminophen **OR** acetaminophen, liver oil-zinc oxide, ondansetron **OR** ondansetron (ZOFRAN) IV  Physical Exam  Constitutional: No distress.  Pulmonary/Chest: Effort normal.  Neurological: She is alert.  [name]            Vital Signs: BP 112/64 (BP Location: Right Arm)   Pulse 84   Temp 97.8 F (36.6 C) (Oral)   Resp 14   Ht 5\' 4"  (1.626 m)   Wt 58.1 kg (128 lb 1.4 oz)   SpO2 99%   BMI 21.99 kg/m  SpO2: SpO2: 99 % O2 Device: O2 Device: Not Delivered O2 Flow Rate:    Intake/output summary: No intake or output data in the 24 hours ending 06/10/17 1055 LBM: Last BM Date: 06/08/17 Baseline Weight: Weight: 58.1 kg (128 lb) Most recent weight: Weight: 58.1 kg (128 lb 1.4 oz)       Palliative Assessment/Data:      Patient Active Problem List   Diagnosis Date Noted  . Pressure injury of skin 06/10/2017  . C. difficile colitis 05/20/2017  . Hyperglycemia   . Acute kidney injury ([place])   . Metabolic encephalopathy 35/32/9924  . Adjustment disorder with mixed anxiety and depressed mood   . Coffee ground emesis 02/12/2017  . Acute on chronic renal failure ([place]) 01/13/2017  . Dementia, vascular 12/03/2016  . Stroke ([place]) 08/20/2016  . Peripheral vascular disease ([employer]) 04/09/2016  . DKA (diabetic ketoacidoses) ([place]) 06/08/2015  . CKD stage 3 due to type 2 diabetes mellitus ([place]) 02/22/2014  . Hypothyroidism 09/23/2012  . GERD (gastroesophageal reflux disease) 09/23/2012  . Anxiety 07/26/2012  . Hyperlipidemia with target LDL less than 70 12/03/2011  . DM (diabetes mellitus) type II uncontrolled, periph  vascular disorder ([place]) 04/03/2011  . CAD (coronary artery disease) 12/13/2009    Palliative Care Assessment & Plan   Patient Profile: [name] [name] a37 y.o.femalewith a known history of diabetes, CAD, COPD, CVA and OSA. Per EMR, the patient was found with an elevated glucose with concern for DKA. She was found high blood sugar more than 1000. She has dementia, has not been eating much and has dysphagia, and has been having frequent falls.     Assessment: [name] [name].   Recommendations/Plan:  Return to facility with Hospice.  Code Status:    Code Status Orders  (From admission, onward)        Start     Ordered   06/06/17 1230  Do not attempt resuscitation (DNR)  Continuous    Question Answer Comment  In the event of cardiac or respiratory ARREST Do not call a "code blue"   In the event of cardiac or respiratory ARREST Do not perform Intubation, CPR, defibrillation or ACLS   In the event of cardiac or respiratory ARREST Use medication by any route, position, wound care, and other measures to relive pain and suffering. May use oxygen, suction and manual treatment of airway obstruction as needed for comfort.      06/06/17 1230    Code Status History    Date Active Date Inactive Code Status Order ID Comments User Context   06/06/2017 12:30 06/06/2017 12:30 DNR [id number]  Baxter Hire, MD Inpatient   05/27/2017 20:53 05/29/2017 19:59 DNR [id number]  Demetrios Loll, MD Inpatient   05/20/2017 05:27 05/21/2017 21:01 Full Code [id number]  Harrie Foreman, MD Inpatient   05/05/2017 14:36 05/05/2017 18:40 Full Code [id number]  Hessie Knows, MD Inpatient   04/15/2017 21:02 04/21/2017 19:16 Full Code [id number]  Abel Presto  J, MD Inpatient   04/05/2017 16:08 04/07/2017 21:19 Full Code [id number]  [name], MD Inpatient   02/12/2017 22:42 02/16/2017 18:46 Full Code [id number]  Lance Coon, MD Inpatient   01/13/2017 20:22 01/16/2017 19:10 Full Code [id number]   Harvie Bridge, DO Inpatient   11/06/2016 11:26 11/15/2016 02:06 DNR [id number]  Flora Lipps, MD Inpatient   11/06/2016 03:25 11/06/2016 11:26 Full Code [id number]  Saundra Shelling, MD Inpatient   07/15/2016 18:36 07/17/2016 22:11 Full Code [id number]  Fritzi Mandes, MD Inpatient   06/08/2015 21:01 06/11/2015 21:20 Full Code [id number]  Henreitta Leber, MD Inpatient   06/08/2015 17:16 06/08/2015 21:01 Full Code [id number]  Henreitta Leber, MD ED       Prognosis:   < 6 months Hyperglycemic episodes. Dysphagia. Falls.   Discharge Planning:  Return to facility with Hospice.   Care plan was discussed with Dr. Anselm Jungling.  Thank you for allowing the Palliative Medicine Team to assist in the care of this patient.   Time In: 9:20 Time Out: 10:50 Total Time 1 hour and a half. Prolonged Time Billed Yes      Greater than 50%  of this time was spent counseling and coordinating care related to the above assessment and plan.  Asencion Gowda, NP 06/10/2017 11:20 AM Office: [phone] 7am-7pm  Pager: [phone] Call primary team after hours Please contact Palliative Medicine Team phone at [phone] for questions and concerns.

## 2017-06-10 NOTE — Care Management (Signed)
RNCM has notified Crystal with [facility] that patient will be discharged today with [facility]. They will close patient out from their services. No other RNCM needs.

## 2017-06-11 LAB — CULTURE, BLOOD (ROUTINE X 2)
CULTURE: NO GROWTH
Culture: NO GROWTH
Special Requests: ADEQUATE

## 2017-06-27 DEATH — deceased

## 2019-03-28 IMAGING — CT CT HEAD W/O CM
3 series · 16 of 47 positions shown, 19 images · non-contrast
Comparison: 02/12/2017

CLINICAL DATA: Altered mental status, nausea, and vomiting.

EXAM:
CT HEAD WITHOUT CONTRAST
TECHNIQUE: Contiguous axial images were obtained from the base of the skull
through the vertex without intravenous contrast.

[Series 2: head wo · axial · 0.47mm/px · z∈[-116,+14]mm · 10 of 32 slices shown, 13 images]
[im 3/32  brain]
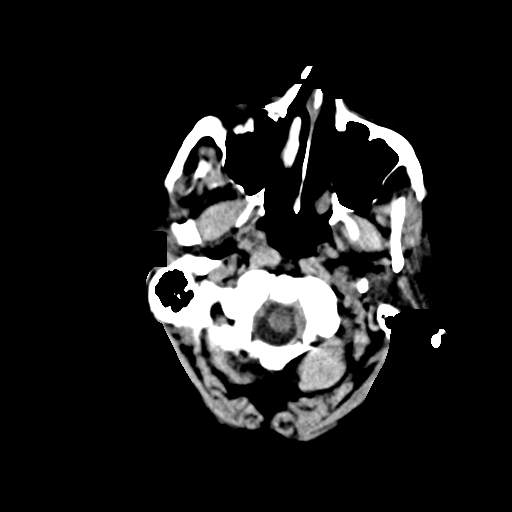
[im 3/32  bone]
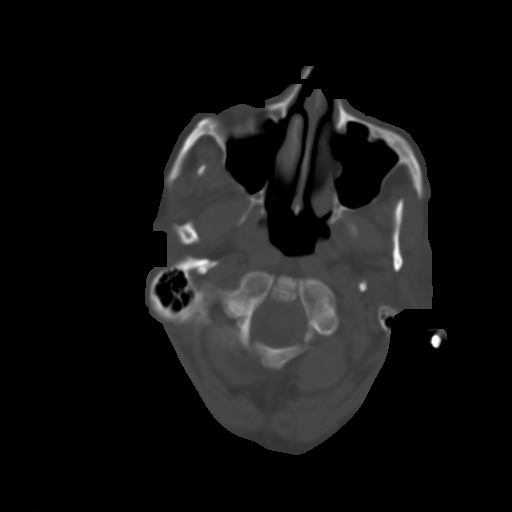
[im 6/32  brain]
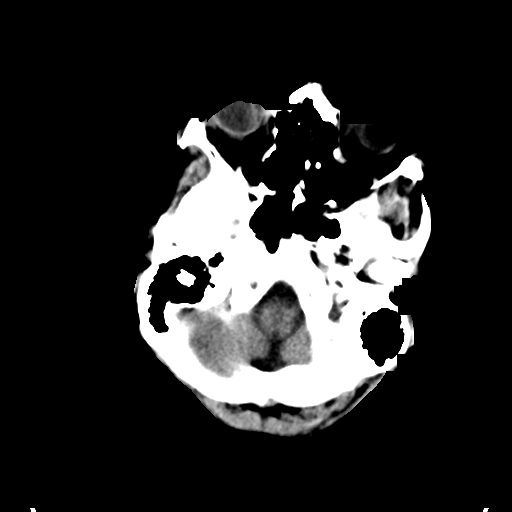
[im 9/32  brain]
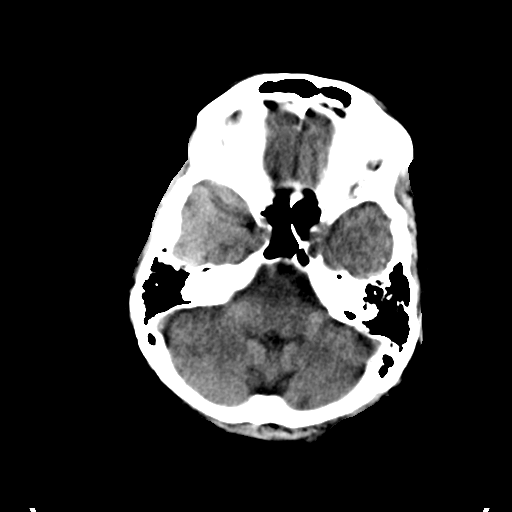
[im 11/32  brain]
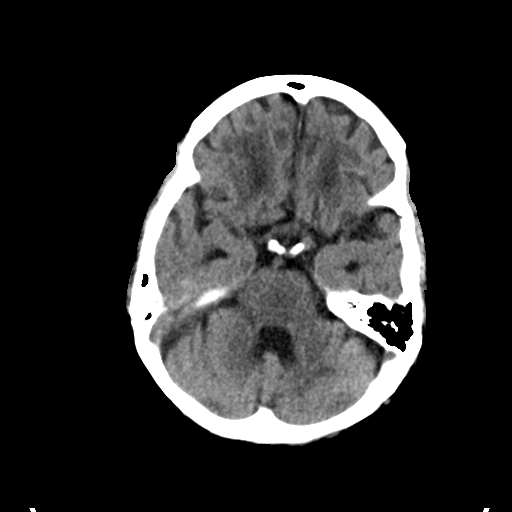
[im 14/32  brain]
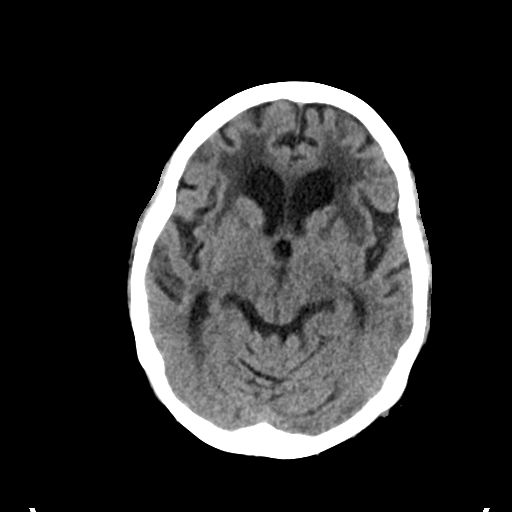
[im 14/32  bone]
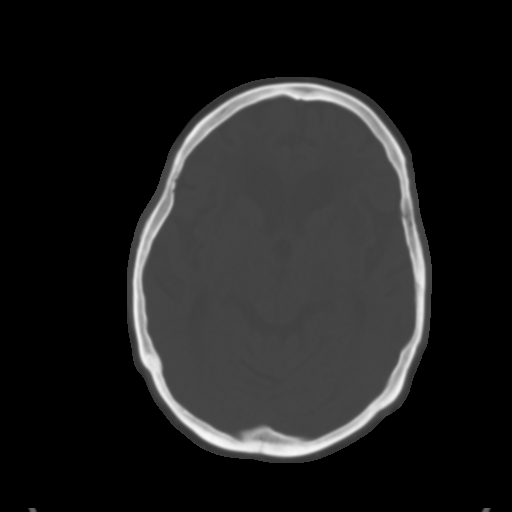
[im 18/32  brain]
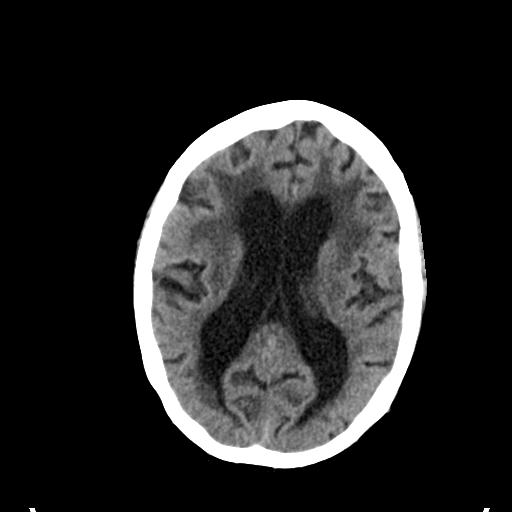
[im 21/32  brain]
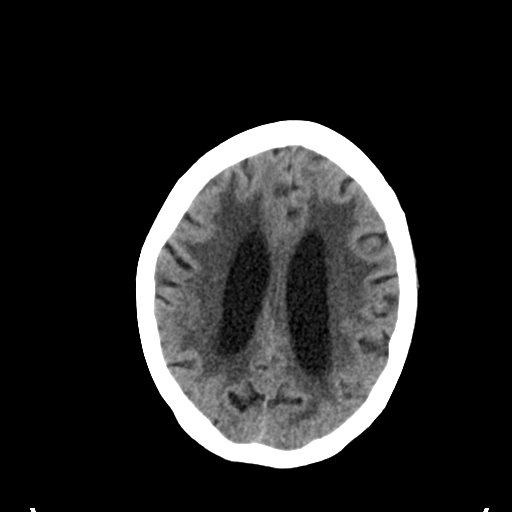
[im 24/32  brain]
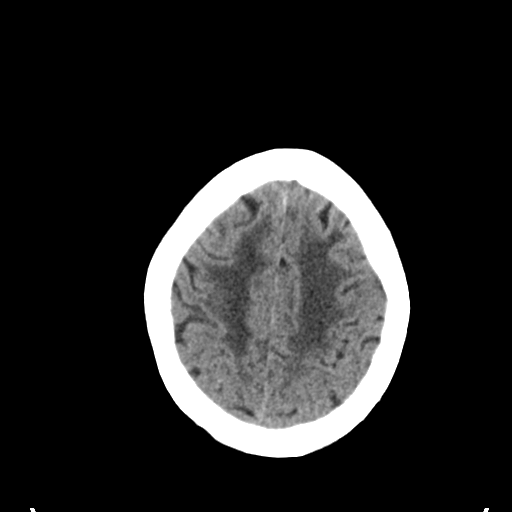
[im 26/32  brain]
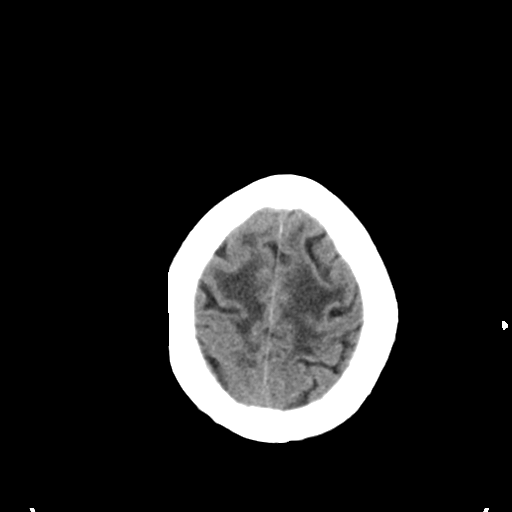
[im 26/32  bone]
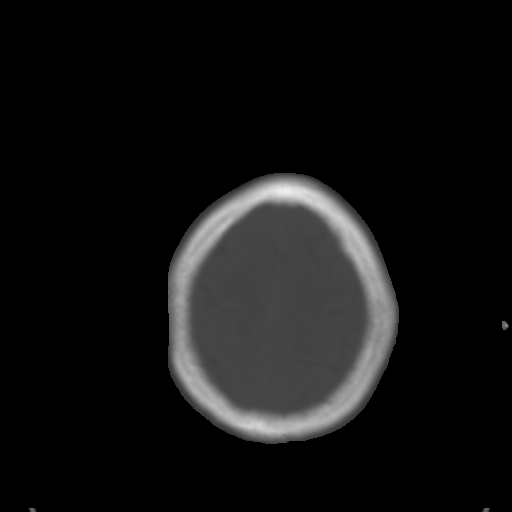
[im 29/32  brain]
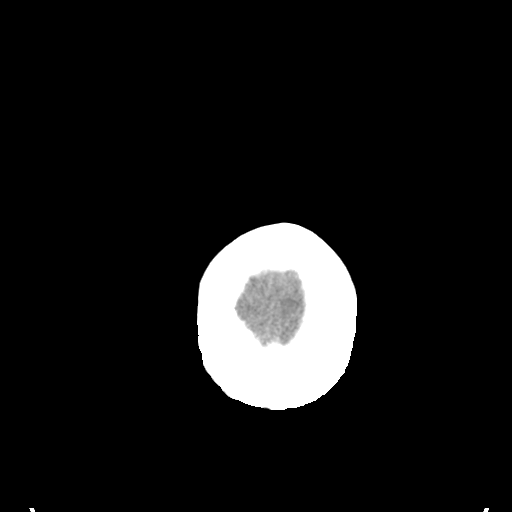

[Series 4: coronal soft tissue · coronal · 0.29mm/px · 3 of 61 slices shown]
[im 21/61  brain]
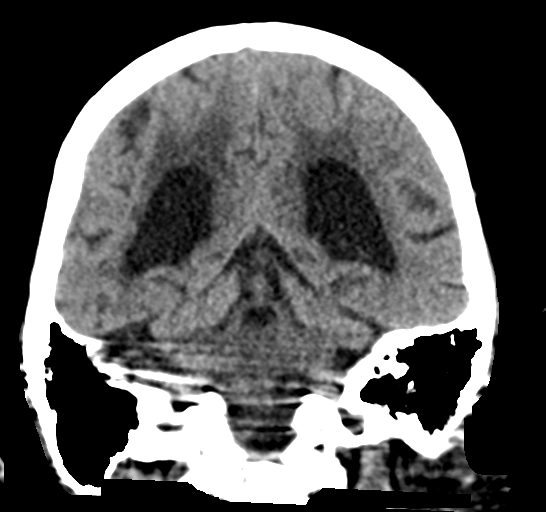
[im 27/61  brain]
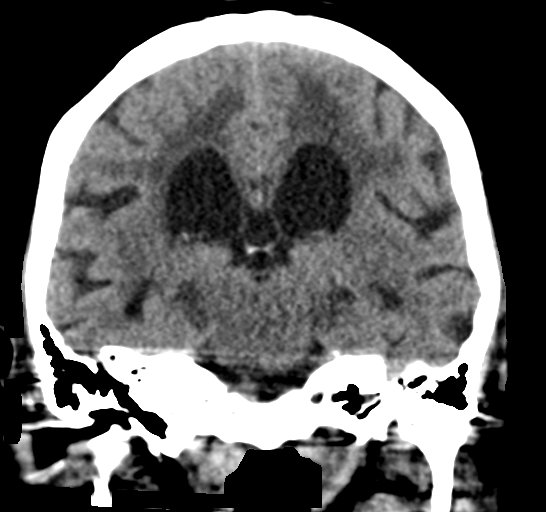
[im 34/61  brain]
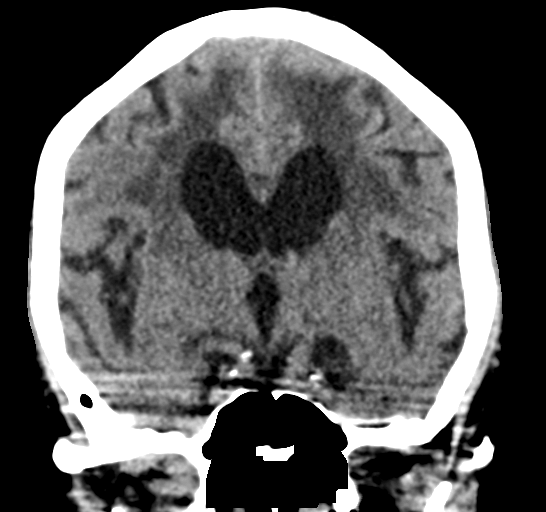

[Series 5: sagittal soft tissue · sagittal · 0.35mm/px · 3 of 49 slices shown]
[im 17/49  brain]
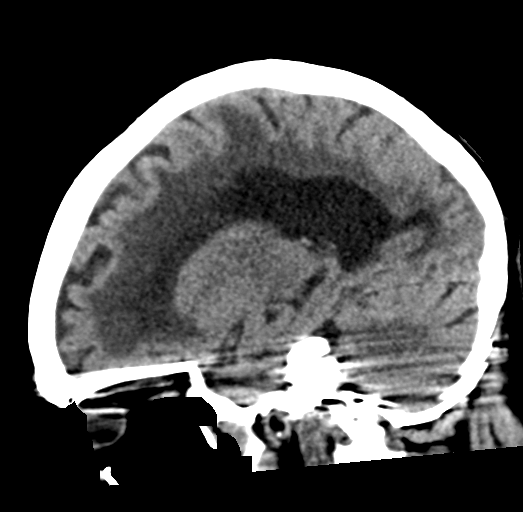
[im 25/49  brain]
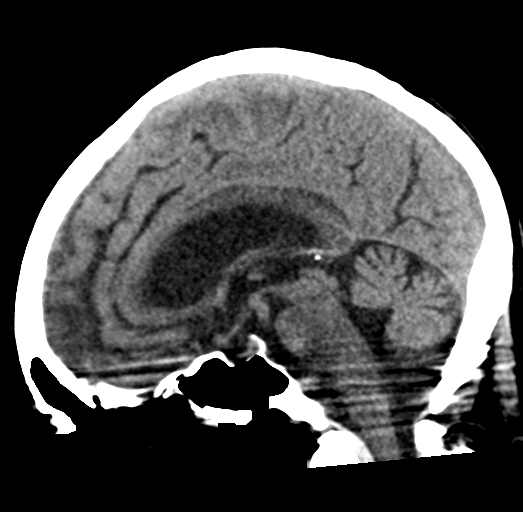
[im 33/49  brain]
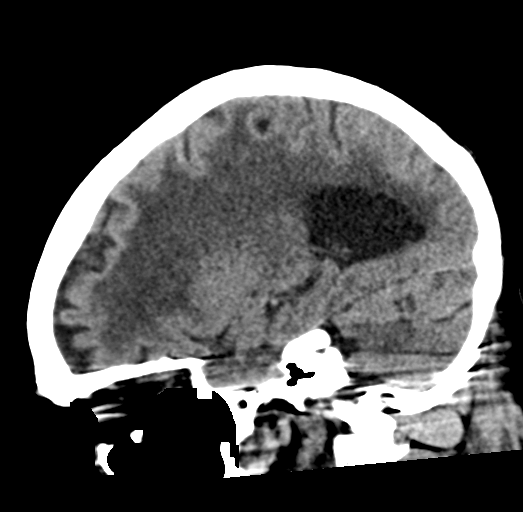

[16 of 47 positions shown; findings below may reference images not displayed]

FINDINGS: Brain: Small chronic infarcts are again seen in the cerebellum and
left basal ganglia. Extensive confluent hypoattenuation throughout
the cerebral white matter bilaterally is unchanged. Ventricular
enlargement is unchanged and may reflect advanced central
predominant cerebral atrophy although normal pressure hydrocephalus
is not excluded in the appropriate clinical setting. No acute large
territory infarct, intracranial hemorrhage, mass, midline shift, or
extra-axial fluid collection is identified.

Vascular: No hyperdense vessel.

Skull: No fracture focal osseous lesion.

Sinuses/Orbits: Visualized paranasal sinuses and mastoid air cells
are clear. Bilateral cataract extraction.

Other: None.
IMPRESSION: 1. No evidence of acute intracranial abnormality.
2. Extensive chronic small vessel ischemic disease.
3. Unchanged ventricular enlargement which may reflect cerebral
atrophy versus normal pressure hydrocephalus.

## 2019-03-28 IMAGING — DX DG CHEST 1V PORT
1 series · 1 of 1 positions shown · non-contrast
Comparison: Radiograph February 12, 2017.

CLINICAL DATA: Altered mental status.

EXAM:
PORTABLE CHEST 1 VIEW

[chest ap]
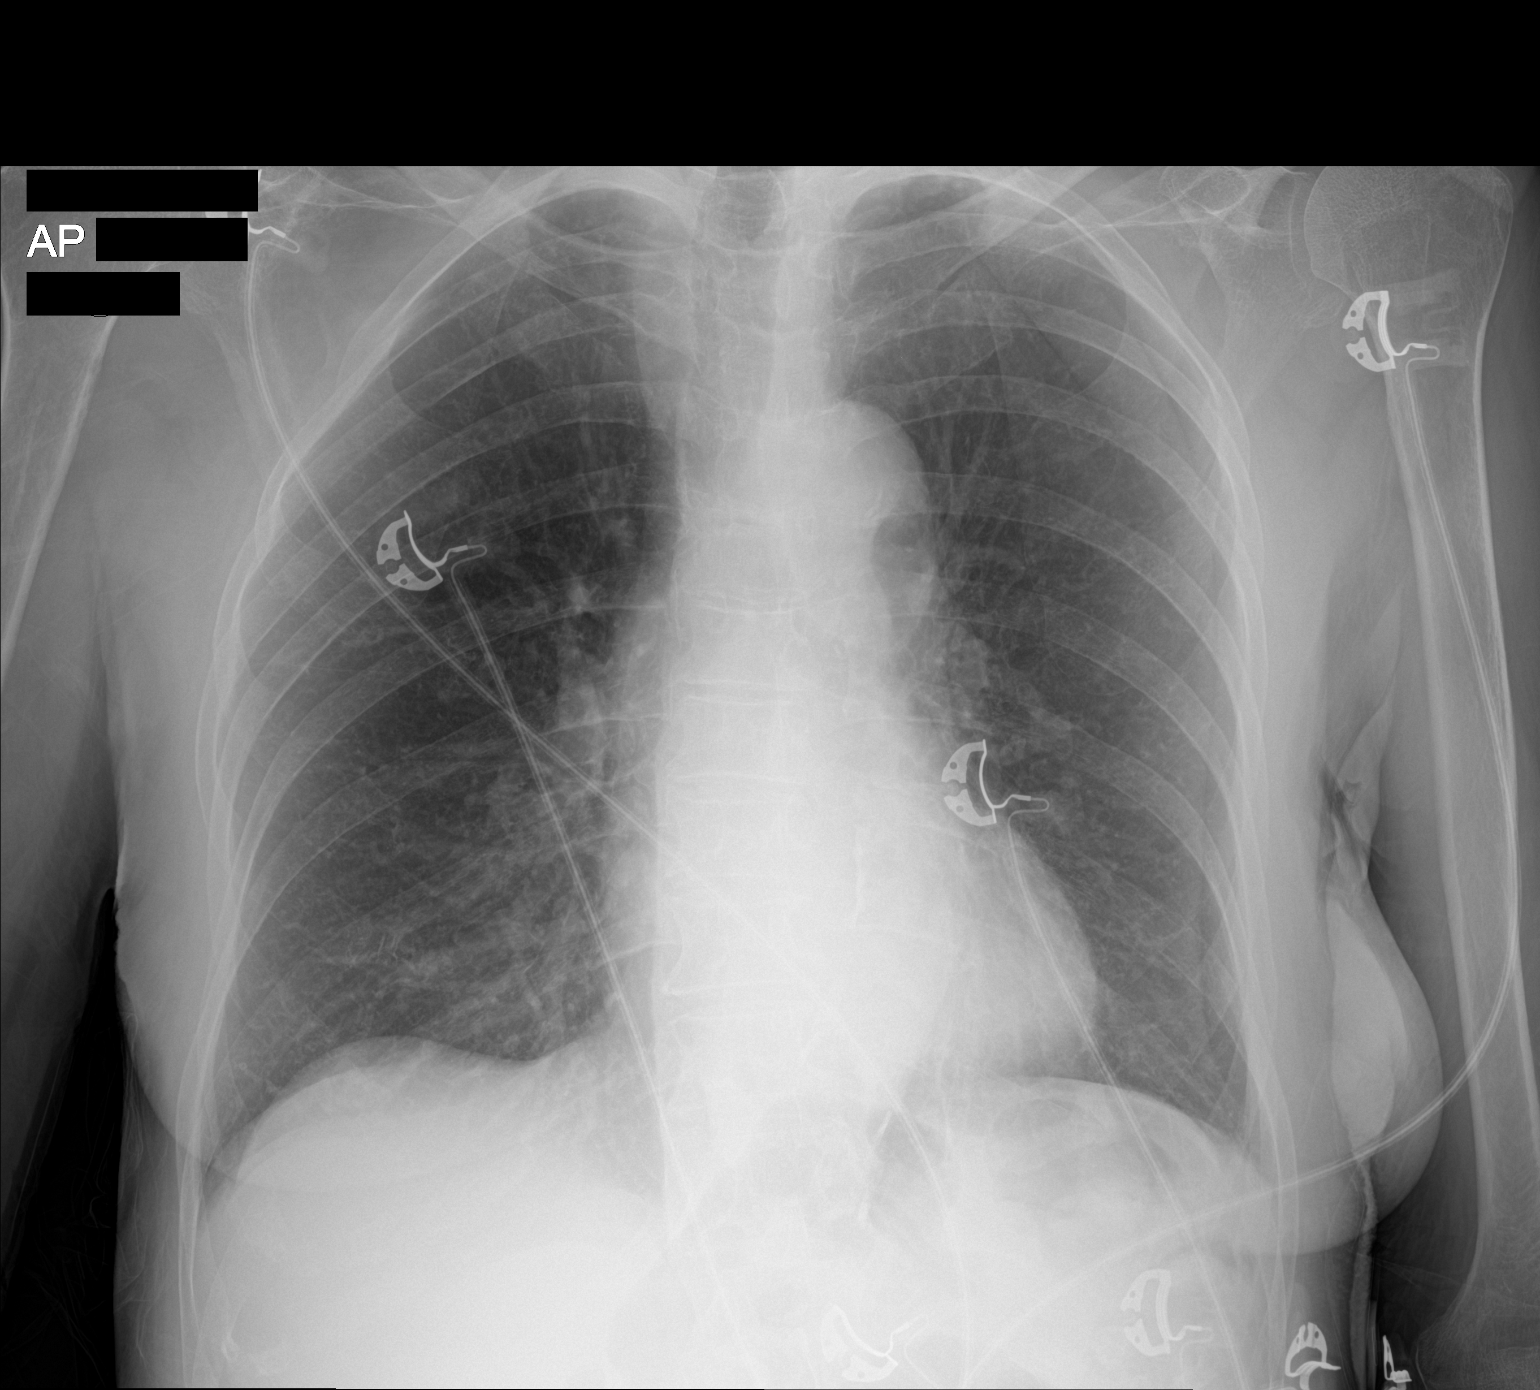

[1 of 1 positions shown; findings below may reference images not displayed]

FINDINGS: The heart size and mediastinal contours are within normal limits.
Atherosclerosis of thoracic aorta is noted. No pneumothorax or
pleural effusion is noted. Both lungs are clear. The visualized
skeletal structures are unremarkable.
IMPRESSION: Aortic atherosclerosis.  No acute cardiopulmonary abnormality seen.
